# Patient Record
Sex: Male | Born: 1950
Health system: Southern US, Community
[De-identification: ages and names within clinical notes are randomized; demographics above are authoritative.]

## PROBLEM LIST (undated history)

## (undated) DIAGNOSIS — E291 Testicular hypofunction: Secondary | ICD-10-CM

## (undated) DIAGNOSIS — K5792 Diverticulitis of intestine, part unspecified, without perforation or abscess without bleeding: Secondary | ICD-10-CM

## (undated) DIAGNOSIS — R5382 Chronic fatigue, unspecified: Secondary | ICD-10-CM

## (undated) DIAGNOSIS — M109 Gout, unspecified: Secondary | ICD-10-CM

## (undated) DIAGNOSIS — L02419 Cutaneous abscess of limb, unspecified: Secondary | ICD-10-CM

## (undated) DIAGNOSIS — E041 Nontoxic single thyroid nodule: Secondary | ICD-10-CM

## (undated) DIAGNOSIS — G4730 Sleep apnea, unspecified: Secondary | ICD-10-CM

## (undated) DIAGNOSIS — C801 Malignant (primary) neoplasm, unspecified: Secondary | ICD-10-CM

## (undated) DIAGNOSIS — J309 Allergic rhinitis, unspecified: Secondary | ICD-10-CM

## (undated) DIAGNOSIS — L501 Idiopathic urticaria: Secondary | ICD-10-CM

## (undated) DIAGNOSIS — J302 Other seasonal allergic rhinitis: Secondary | ICD-10-CM

## (undated) DIAGNOSIS — L03119 Cellulitis of unspecified part of limb: Secondary | ICD-10-CM

## (undated) DIAGNOSIS — K219 Gastro-esophageal reflux disease without esophagitis: Secondary | ICD-10-CM

## (undated) DIAGNOSIS — E039 Hypothyroidism, unspecified: Secondary | ICD-10-CM

## (undated) DIAGNOSIS — M1990 Unspecified osteoarthritis, unspecified site: Secondary | ICD-10-CM

## (undated) DIAGNOSIS — E063 Autoimmune thyroiditis: Secondary | ICD-10-CM

## (undated) DIAGNOSIS — I10 Essential (primary) hypertension: Secondary | ICD-10-CM

## (undated) DIAGNOSIS — G43909 Migraine, unspecified, not intractable, without status migrainosus: Secondary | ICD-10-CM

## (undated) HISTORY — DX: Autoimmune thyroiditis: E06.3

## (undated) HISTORY — DX: Gout, unspecified: M10.9

## (undated) HISTORY — DX: Migraine, unspecified, not intractable, without status migrainosus: G43.909

## (undated) HISTORY — DX: Chronic fatigue, unspecified: R53.82

## (undated) HISTORY — DX: Cutaneous abscess of limb, unspecified: L02.419

## (undated) HISTORY — DX: Idiopathic urticaria: L50.1

## (undated) HISTORY — DX: Diverticulitis of intestine, part unspecified, without perforation or abscess without bleeding: K57.92

## (undated) HISTORY — DX: Unspecified osteoarthritis, unspecified site: M19.90

## (undated) HISTORY — DX: Allergic rhinitis, unspecified: J30.9

## (undated) HISTORY — DX: Hypothyroidism, unspecified: E03.9

## (undated) HISTORY — DX: Nontoxic single thyroid nodule: E04.1

## (undated) HISTORY — DX: Cutaneous abscess of limb, unspecified: L03.119

## (undated) HISTORY — DX: Testicular hypofunction: E29.1

## (undated) HISTORY — DX: Gastro-esophageal reflux disease without esophagitis: K21.9

---
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
## 1972-02-21 HISTORY — PX: CYSTOSCOPY: SUR368

## 1980-02-21 HISTORY — PX: OTHER SURGICAL HISTORY: SHX169

## 1988-02-21 HISTORY — PX: OTHER SURGICAL HISTORY: SHX169

## 1997-02-20 HISTORY — PX: TOTAL HIP ARTHROPLASTY: SHX124

## 1997-07-06 ENCOUNTER — Inpatient Hospital Stay (HOSPITAL_COMMUNITY): Admission: RE | Admit: 1997-07-06 | Discharge: 1997-07-11 | Payer: Self-pay | Admitting: Orthopedic Surgery

## 1997-07-13 ENCOUNTER — Other Ambulatory Visit: Admission: RE | Admit: 1997-07-13 | Discharge: 1997-07-13 | Payer: Self-pay | Admitting: Orthopedic Surgery

## 1997-07-21 ENCOUNTER — Other Ambulatory Visit: Admission: RE | Admit: 1997-07-21 | Discharge: 1997-07-21 | Payer: Self-pay | Admitting: Orthopedic Surgery

## 1997-07-29 ENCOUNTER — Other Ambulatory Visit: Admission: RE | Admit: 1997-07-29 | Discharge: 1997-07-29 | Payer: Self-pay | Admitting: Orthopedic Surgery

## 1998-09-15 ENCOUNTER — Encounter: Payer: Self-pay | Admitting: Gastroenterology

## 1998-09-15 ENCOUNTER — Ambulatory Visit (HOSPITAL_COMMUNITY): Admission: RE | Admit: 1998-09-15 | Discharge: 1998-09-15 | Payer: Self-pay | Admitting: Gastroenterology

## 1999-05-06 ENCOUNTER — Ambulatory Visit (HOSPITAL_COMMUNITY): Admission: RE | Admit: 1999-05-06 | Discharge: 1999-05-06 | Payer: Self-pay | Admitting: Gastroenterology

## 1999-06-22 ENCOUNTER — Encounter: Admission: RE | Admit: 1999-06-22 | Discharge: 1999-06-22 | Payer: Self-pay | Admitting: Gastroenterology

## 1999-06-22 ENCOUNTER — Encounter: Payer: Self-pay | Admitting: Gastroenterology

## 1999-07-25 ENCOUNTER — Encounter: Payer: Self-pay | Admitting: Gastroenterology

## 1999-07-25 ENCOUNTER — Ambulatory Visit (HOSPITAL_COMMUNITY): Admission: RE | Admit: 1999-07-25 | Discharge: 1999-07-25 | Payer: Self-pay | Admitting: Gastroenterology

## 1999-09-15 ENCOUNTER — Encounter: Admission: RE | Admit: 1999-09-15 | Discharge: 1999-09-15 | Payer: Self-pay | Admitting: Internal Medicine

## 1999-09-15 ENCOUNTER — Encounter: Payer: Self-pay | Admitting: Internal Medicine

## 1999-09-22 ENCOUNTER — Encounter: Admission: RE | Admit: 1999-09-22 | Discharge: 1999-09-22 | Payer: Self-pay | Admitting: Gastroenterology

## 1999-09-22 ENCOUNTER — Encounter: Payer: Self-pay | Admitting: Gastroenterology

## 2000-03-05 ENCOUNTER — Encounter: Payer: Self-pay | Admitting: Gastroenterology

## 2000-03-05 ENCOUNTER — Ambulatory Visit (HOSPITAL_COMMUNITY): Admission: RE | Admit: 2000-03-05 | Discharge: 2000-03-05 | Payer: Self-pay | Admitting: Gastroenterology

## 2000-05-01 ENCOUNTER — Ambulatory Visit (HOSPITAL_COMMUNITY): Admission: RE | Admit: 2000-05-01 | Discharge: 2000-05-01 | Payer: Self-pay | Admitting: Family Medicine

## 2000-05-01 ENCOUNTER — Encounter: Payer: Self-pay | Admitting: Family Medicine

## 2000-06-15 ENCOUNTER — Ambulatory Visit (HOSPITAL_COMMUNITY): Admission: RE | Admit: 2000-06-15 | Discharge: 2000-06-15 | Payer: Self-pay | Admitting: Interventional Cardiology

## 2000-06-18 ENCOUNTER — Encounter: Payer: Self-pay | Admitting: Interventional Cardiology

## 2000-07-19 ENCOUNTER — Emergency Department (HOSPITAL_COMMUNITY): Admission: EM | Admit: 2000-07-19 | Discharge: 2000-07-19 | Payer: Self-pay | Admitting: Emergency Medicine

## 2000-07-21 ENCOUNTER — Ambulatory Visit (HOSPITAL_COMMUNITY): Admission: RE | Admit: 2000-07-21 | Discharge: 2000-07-21 | Payer: Self-pay | Admitting: Geriatric Medicine

## 2000-07-21 ENCOUNTER — Encounter: Payer: Self-pay | Admitting: Geriatric Medicine

## 2000-07-30 ENCOUNTER — Ambulatory Visit (HOSPITAL_COMMUNITY): Admission: RE | Admit: 2000-07-30 | Discharge: 2000-07-30 | Payer: Self-pay | Admitting: Interventional Cardiology

## 2000-08-10 ENCOUNTER — Ambulatory Visit (HOSPITAL_COMMUNITY): Admission: RE | Admit: 2000-08-10 | Discharge: 2000-08-10 | Payer: Self-pay | Admitting: Interventional Cardiology

## 2000-08-22 ENCOUNTER — Ambulatory Visit (HOSPITAL_COMMUNITY): Admission: RE | Admit: 2000-08-22 | Discharge: 2000-08-22 | Payer: Self-pay | Admitting: Orthopedic Surgery

## 2000-08-24 ENCOUNTER — Encounter: Payer: Self-pay | Admitting: Gastroenterology

## 2000-08-24 ENCOUNTER — Encounter: Admission: RE | Admit: 2000-08-24 | Discharge: 2000-08-24 | Payer: Self-pay

## 2000-12-10 ENCOUNTER — Encounter: Admission: RE | Admit: 2000-12-10 | Discharge: 2000-12-10 | Payer: Self-pay | Admitting: Gastroenterology

## 2000-12-10 ENCOUNTER — Encounter: Payer: Self-pay | Admitting: Gastroenterology

## 2001-01-04 ENCOUNTER — Ambulatory Visit (HOSPITAL_COMMUNITY): Admission: RE | Admit: 2001-01-04 | Discharge: 2001-01-04 | Payer: Self-pay | Admitting: Family Medicine

## 2001-01-04 ENCOUNTER — Encounter: Payer: Self-pay | Admitting: Internal Medicine

## 2001-09-24 ENCOUNTER — Encounter: Payer: Self-pay | Admitting: Internal Medicine

## 2001-09-24 ENCOUNTER — Encounter: Admission: RE | Admit: 2001-09-24 | Discharge: 2001-09-24 | Payer: Self-pay | Admitting: Internal Medicine

## 2002-02-20 HISTORY — PX: CHOLECYSTECTOMY: SHX55

## 2002-06-02 ENCOUNTER — Encounter: Payer: Self-pay | Admitting: Internal Medicine

## 2002-06-02 ENCOUNTER — Encounter: Admission: RE | Admit: 2002-06-02 | Discharge: 2002-06-02 | Payer: Self-pay | Admitting: Internal Medicine

## 2002-08-08 ENCOUNTER — Encounter: Payer: Self-pay | Admitting: Surgery

## 2002-08-08 ENCOUNTER — Encounter: Admission: RE | Admit: 2002-08-08 | Discharge: 2002-08-08 | Payer: Self-pay | Admitting: Surgery

## 2002-12-19 ENCOUNTER — Encounter (INDEPENDENT_AMBULATORY_CARE_PROVIDER_SITE_OTHER): Payer: Self-pay | Admitting: *Deleted

## 2002-12-19 ENCOUNTER — Ambulatory Visit (HOSPITAL_COMMUNITY): Admission: RE | Admit: 2002-12-19 | Discharge: 2002-12-20 | Payer: Self-pay | Admitting: Surgery

## 2003-09-10 ENCOUNTER — Encounter: Admission: RE | Admit: 2003-09-10 | Discharge: 2003-09-10 | Payer: Self-pay | Admitting: Orthopedic Surgery

## 2004-03-31 ENCOUNTER — Encounter: Admission: RE | Admit: 2004-03-31 | Discharge: 2004-03-31 | Payer: Self-pay | Admitting: Orthopedic Surgery

## 2005-11-07 ENCOUNTER — Encounter: Admission: RE | Admit: 2005-11-07 | Discharge: 2005-11-07 | Payer: Self-pay | Admitting: Gastroenterology

## 2005-11-14 ENCOUNTER — Encounter: Admission: RE | Admit: 2005-11-14 | Discharge: 2005-11-14 | Payer: Self-pay | Admitting: Gastroenterology

## 2005-11-20 ENCOUNTER — Emergency Department (HOSPITAL_COMMUNITY): Admission: EM | Admit: 2005-11-20 | Discharge: 2005-11-20 | Payer: Self-pay | Admitting: Emergency Medicine

## 2006-02-20 DIAGNOSIS — E041 Nontoxic single thyroid nodule: Secondary | ICD-10-CM

## 2006-02-20 HISTORY — DX: Nontoxic single thyroid nodule: E04.1

## 2006-02-27 ENCOUNTER — Encounter: Admission: RE | Admit: 2006-02-27 | Discharge: 2006-02-27 | Payer: Self-pay | Admitting: Internal Medicine

## 2006-03-01 ENCOUNTER — Encounter: Admission: RE | Admit: 2006-03-01 | Discharge: 2006-03-01 | Payer: Self-pay | Admitting: Orthopaedic Surgery

## 2006-04-12 ENCOUNTER — Encounter: Admission: RE | Admit: 2006-04-12 | Discharge: 2006-04-12 | Payer: Self-pay | Admitting: Orthopaedic Surgery

## 2006-05-16 ENCOUNTER — Encounter: Payer: Self-pay | Admitting: Internal Medicine

## 2006-05-17 ENCOUNTER — Ambulatory Visit: Payer: Self-pay | Admitting: Internal Medicine

## 2006-05-17 DIAGNOSIS — I10 Essential (primary) hypertension: Secondary | ICD-10-CM

## 2006-05-17 DIAGNOSIS — E669 Obesity, unspecified: Secondary | ICD-10-CM

## 2006-05-17 DIAGNOSIS — J309 Allergic rhinitis, unspecified: Secondary | ICD-10-CM | POA: Insufficient documentation

## 2006-05-17 DIAGNOSIS — M1991 Primary osteoarthritis, unspecified site: Secondary | ICD-10-CM | POA: Insufficient documentation

## 2006-05-17 DIAGNOSIS — M1990 Unspecified osteoarthritis, unspecified site: Secondary | ICD-10-CM | POA: Insufficient documentation

## 2006-05-17 DIAGNOSIS — Z96643 Presence of artificial hip joint, bilateral: Secondary | ICD-10-CM | POA: Insufficient documentation

## 2006-05-17 HISTORY — DX: Essential (primary) hypertension: I10

## 2006-05-17 HISTORY — DX: Obesity, unspecified: E66.9

## 2006-05-17 HISTORY — DX: Primary osteoarthritis, unspecified site: M19.91

## 2006-05-17 HISTORY — DX: Allergic rhinitis, unspecified: J30.9

## 2006-06-06 ENCOUNTER — Telehealth: Payer: Self-pay | Admitting: Internal Medicine

## 2006-06-16 ENCOUNTER — Encounter: Admission: RE | Admit: 2006-06-16 | Discharge: 2006-06-16 | Payer: Self-pay | Admitting: Gastroenterology

## 2006-07-26 ENCOUNTER — Inpatient Hospital Stay (HOSPITAL_COMMUNITY): Admission: RE | Admit: 2006-07-26 | Discharge: 2006-07-29 | Payer: Self-pay | Admitting: Orthopaedic Surgery

## 2006-07-26 HISTORY — PX: TOTAL HIP ARTHROPLASTY: SHX124

## 2006-10-08 ENCOUNTER — Encounter (INDEPENDENT_AMBULATORY_CARE_PROVIDER_SITE_OTHER): Payer: Self-pay | Admitting: Orthopaedic Surgery

## 2006-10-08 ENCOUNTER — Ambulatory Visit (HOSPITAL_COMMUNITY): Admission: RE | Admit: 2006-10-08 | Discharge: 2006-10-08 | Payer: Self-pay | Admitting: Orthopaedic Surgery

## 2006-10-08 ENCOUNTER — Ambulatory Visit: Payer: Self-pay | Admitting: Vascular Surgery

## 2006-11-24 ENCOUNTER — Encounter: Admission: RE | Admit: 2006-11-24 | Discharge: 2006-11-24 | Payer: Self-pay | Admitting: Gastroenterology

## 2006-11-24 ENCOUNTER — Encounter: Payer: Self-pay | Admitting: Internal Medicine

## 2007-01-24 ENCOUNTER — Encounter: Admission: RE | Admit: 2007-01-24 | Discharge: 2007-01-24 | Payer: Self-pay | Admitting: Internal Medicine

## 2007-02-08 ENCOUNTER — Other Ambulatory Visit: Admission: RE | Admit: 2007-02-08 | Discharge: 2007-02-08 | Payer: Self-pay | Admitting: Interventional Radiology

## 2007-02-08 ENCOUNTER — Encounter (INDEPENDENT_AMBULATORY_CARE_PROVIDER_SITE_OTHER): Payer: Self-pay | Admitting: Interventional Radiology

## 2007-02-08 ENCOUNTER — Encounter: Admission: RE | Admit: 2007-02-08 | Discharge: 2007-02-08 | Payer: Self-pay | Admitting: Surgery

## 2007-10-17 ENCOUNTER — Encounter: Admission: RE | Admit: 2007-10-17 | Discharge: 2007-10-17 | Payer: Self-pay | Admitting: Internal Medicine

## 2008-01-15 ENCOUNTER — Encounter: Admission: RE | Admit: 2008-01-15 | Discharge: 2008-01-15 | Payer: Self-pay | Admitting: Orthopaedic Surgery

## 2008-05-15 ENCOUNTER — Encounter: Payer: Self-pay | Admitting: Internal Medicine

## 2008-05-15 ENCOUNTER — Encounter: Admission: RE | Admit: 2008-05-15 | Discharge: 2008-05-15 | Payer: Self-pay | Admitting: Internal Medicine

## 2008-10-01 ENCOUNTER — Ambulatory Visit: Payer: Self-pay | Admitting: Internal Medicine

## 2008-10-19 ENCOUNTER — Ambulatory Visit: Payer: Self-pay | Admitting: Internal Medicine

## 2008-11-12 ENCOUNTER — Telehealth: Payer: Self-pay | Admitting: Internal Medicine

## 2008-12-21 ENCOUNTER — Emergency Department (HOSPITAL_BASED_OUTPATIENT_CLINIC_OR_DEPARTMENT_OTHER): Admission: EM | Admit: 2008-12-21 | Discharge: 2008-12-21 | Payer: Self-pay | Admitting: Emergency Medicine

## 2008-12-21 ENCOUNTER — Ambulatory Visit: Payer: Self-pay | Admitting: Diagnostic Radiology

## 2009-07-14 ENCOUNTER — Encounter: Admission: RE | Admit: 2009-07-14 | Discharge: 2009-07-14 | Payer: Self-pay | Admitting: Internal Medicine

## 2009-07-26 ENCOUNTER — Encounter: Admission: RE | Admit: 2009-07-26 | Discharge: 2009-07-26 | Payer: Self-pay | Admitting: Gastroenterology

## 2010-03-13 ENCOUNTER — Encounter: Payer: Self-pay | Admitting: Orthopaedic Surgery

## 2010-05-25 LAB — CBC
Hemoglobin: 16.9 g/dL (ref 13.0–17.0)
MCHC: 34.3 g/dL (ref 30.0–36.0)
RBC: 5.65 MIL/uL (ref 4.22–5.81)
WBC: 7.5 10*3/uL (ref 4.0–10.5)

## 2010-05-25 LAB — COMPREHENSIVE METABOLIC PANEL
Albumin: 4.5 g/dL (ref 3.5–5.2)
BUN: 16 mg/dL (ref 6–23)
Chloride: 97 mEq/L (ref 96–112)
Creatinine, Ser: 1 mg/dL (ref 0.4–1.5)
GFR calc Af Amer: >60 mL/min (ref >60)
Glucose, Bld: 95 mg/dL (ref 70–99)
Total Bilirubin: 0.9 mg/dL (ref 0.3–1.2)

## 2010-05-25 LAB — DIFFERENTIAL
Basophils Absolute: 0 10*3/uL (ref 0.0–0.1)
Lymphocytes Relative: 35 % (ref 12–46)
Monocytes Absolute: 0.7 10*3/uL (ref 0.1–1.0)
Neutro Abs: 3.8 10*3/uL (ref 1.7–7.7)
Neutrophils Relative %: 50 % (ref 43–77)

## 2010-07-05 NOTE — Op Note (Signed)
NAMESABIR, CHARTERS              ACCOUNT NO.:  [id number]   MEDICAL RECORD NO.:  [id number]          PATIENT TYPE:  INP   LOCATION:  2899                         FACILITY:  [facility]   PHYSICIAN:  Claude Manges. Whitfield, M.D.DATE OF BIRTH:  1950/08/17   DATE OF PROCEDURE:  07/26/2006  DATE OF DISCHARGE:                               OPERATIVE REPORT   PREOPERATIVE DIAGNOSIS:  Osteoarthritis left hip.   POSTOPERATIVE DIAGNOSIS:  Osteoarthritis left hip.   PROCEDURE:  Left total hip replacement.   SURGEON:  Claude Manges. Cleophas Dunker, M.D.   ASSISTANT:  Thereasa Distance A. Chaney Malling, M.D.  Richardean Canal, P.A.   ANESTHESIA:  General.   COMPLICATIONS:  None.   ESTIMATED BLOOD LOSS:  About 700 mL.   COMPONENTS:  DePuy AML 13.5 mm large femoral component, +5 neck length  with 36 mm outer diameter hip ball, a 56 mm outer diameter acetabular  100 series component with the apex hole eliminator and the marathon  polyethylene liner +4 with a 10 degrees lip.   PROCEDURE:  The patient comfortable on the operating table and under  general orotracheal anesthesia the nursing staff inserted a Foley  catheter.  The patient was then placed in the lateral decubitus position  with the left side up and secured to the operating room table with the  Innomed hip system.  The hip was prepped with Betadine scrub and then  DuraPrep from iliac crest to the midcalf.  Sterile draping was  performed.   A routine southern incision was utilized and via sharp dissection  created in the subcutaneous tissue.  By sharp dissection abundant  adipose tissue was incised at the level of the iliotibial band.  The  iliotibial band was then incised along the length of the skin incision.  Self-retaining retractor, East-West retractor was inserted.  With the  hip internally rotated the short external rotators were identified.  They were tagged with 0 Ethilon suture and then incised from the  posterior attachment of the greater trochanter.   Capsule was identified  and incised along the femoral neck and head.  There was a clear yellow  joint effusion and moderate beefy red synovitis.  The hip was easily  dislocated posteriorly.   The head was then osteotomized just over fingerbreadth proximal to the  lesser trochanter and then removed from the wound.  Piriformis fossa was  identified.  A starter hole was then made, calcar reamer inserted and  reaming was performed to 13 mm to accept a 13.5 mm component which we  had templated preoperatively in which I felt was the appropriate size.   Rasping was then performed sequentially to 13.5 mm large component.   I thought we were still a little long in the neck so we osteotomized  about 2 mm and that left [place] about one fingerbreadth or just under and  proximal to the lesser trochanter.  We had a very nice fit.   Retractor was then placed about the acetabulum.  The labrum was sharply  excised circumferentially and reaming was performed sequentially to 55  mm to accept a 56-mm component.  We  had excellent depth and  circumferential bleeding.  We trialed a 54-mm component.  We had a nice  rim fit but it would completely seat.  The 56 did not completely seat  and we felt that was the appropriate size.  The 56 mm outer diameter  acetabular component was then impacted.  Because we had some difficulty  visualizing we obtained an x-ray and felt there was a little bit to  vertical so then we re-positioned the acetabulum with less vertical  position and slightly more flexion.  It was very nice and tight.   The plus 4, 10 degree liner was then inserted.  We reinserted the  femoral component.  We trialed several neck and head sizes.  Again we  felt that the 36 was the most stable and this certainly corresponded to  the polyethylene in the acetabulum.  We initially tried a -2 and then a  +1.5 mm neck length and felt that the +1.5 was perfectly stable.   The trial components were then removed.   We copiously irrigated the  joint with saline solution.  The apex hole eliminator was inserted.  We  impacted the final marathon polyethylene component.   The femoral canal was then irrigated.  The femoral component was then  impacted onto the calcar.  We trialed a 1.5 mm ball.  We thought it was  just a little unstable with flexion and internal rotation so that we  opted to insert the +5 ball.  It took [place] a while to insert this but once  it was inserted we thought we had perfect stability.  The hip was then  dislocated posteriorly.  We cleaned the [facility] taper neck, checked to be  sure there was no loose tissue in the acetabulum and then impacted the  36 mm outer diameter hip ball with a 5 mm neck length.  This was then  reduced.  There was no toggling.  We are perhaps a slight longer on the  left leg than the right.  He was about 1/4 inch short preoperatively but  we could not dislocate the hip.  It was not tight in extension.  The  wound was then copiously irrigated with saline solution.  We injected  Marcaine with epinephrine throughout our closure.  The capsule was  closed with 0 Ethibond suture.  The short external rotators were closed  anatomically with the same material.  The wound was again irrigated.  We  further infiltrated with Marcaine epinephrine along the iliotibial band.  This was closed with a running 0 Vicryl.  We closed the subcu in several  layers with 0 and 2-0 Vicryl, skin closed with skin clips.  Sterile  bulky dressing was applied.  The patient was then placed in the supine  position, returned to the post anesthesia recovery room in satisfactory  condition.      Claude Manges. Cleophas Dunker, M.D.  Electronically Signed     PWW/MEDQ  D:  07/26/2006  T:  07/26/2006  Job:  [id number]

## 2010-07-08 NOTE — Discharge Summary (Signed)
Jonathan Powers, Jonathan Powers              ACCOUNT NO.:  [id number]   MEDICAL RECORD NO.:  [id number]          PATIENT TYPE:  INP   LOCATION:  5017                         FACILITY:  [facility]   PHYSICIAN:  Claude Manges. Whitfield, M.D.DATE OF BIRTH:  06-01-1950   DATE OF ADMISSION:  07/26/2006  DATE OF DISCHARGE:  07/29/2006                               DISCHARGE SUMMARY   ADMISSION DIAGNOSES:  1. End-stage osteoarthritis left hip.  2. Hypertension.  3. Obesity.  4. Low testosterone.  5. Low vitamin D.  6. History of right total hip.   DISCHARGE DIAGNOSES:  1. End-stage osteoarthritis left hip status post left total hip      arthroplasty.  2. Acute blood loss anemia secondary to surgery.  3. Hypoxia now resolved.  4. Constipation now resolved.  5. Hypertension.  6. Obesity.  7. Low testosterone.  8. Low vitamin D.  9. History of right total hip.   SURGICAL PROCEDURES:  On July 26, 2006, Mr. Bettendorf underwent a left  total hip arthroplasty by Dr. Claude Manges.  Whitfield assisted by Dr. Rinaldo Ratel and Rexene Edison PA-C.  He had a DePuy Pinnacle 100 series  acetabular cup size 56 mm placed with an apex hole eliminator and a  Pinnacle Marathon acetabular liner +4 10-degree liner, 56 mm outer  diameter, 36 mm inner diameter.  An AML large stature 160 mm length, 45  mm offset size, 13.5 femoral stem with an __________  metal-on-metal  femoral head, 36 mm head, +5 neck length, 12/14 cone.   COMPLICATIONS:  None.   CONSULTANT:  1. Pharmacy consult for Coumadin therapy, July 26, 2006, in addition to      Internal Medicine consult.  2. Physical therapy consult July 27, 2006.  3. Occupational therapy consult July 28, 2006.   HISTORY OF PRESENT ILLNESS:  This 60 year old white male patient  presented to Dr. Cleophas Dunker with several year history of left groin and  hip pain.  He has a history of a right hip replacement which is done  well.  Pain has gotten worse over the last 6 months where crutches  are  necessary.  Pain is constant, present with any range of motion and  causes difficulty sleeping.  He has had multiple corticosteroid  injections with minimal improvements over the last one.  X-rays showed  near bone-on-bone contact arthritis in the left hip.  Because of his  failure of conservative treatment, he is presenting for a left hip  replacement.   HOSPITAL COURSE:  Mr. Dedman tolerated surgical procedure well without  immediate postoperative complications.  He was transferred to 5000.  On  the night of surgery, he had one episode of shortness of breath, but O2  sats remained within normal limits.  Leg was neurovascularly intact.  Internal Medicine was asked to consult because of low sats, and they  followed him the rest of this hospitalization.   Postop day #1, T-max was 99.3, vitals were stable.  Hemoglobin 11.8,  hematocrit 34.  Leg was neurovascularly intact.  Pain was fairly well  controlled.  He was started on therapy per  protocol.  Due to the  hypoxia, they felt he might have some sleep apnea, so chest x-ray was  ordered and immediate mobilization.   Postop day #2, T-max 99.1, vitals stable.  Hemoglobin 10.2, hematocrit  29.4, INR was 1.7.  UA showed a few bacteria.  Chest x-ray had shown  maybe a little atelectasis.  Pain was well-controlled and incision was  well-approximated with staples.  He was started on OxyContin 10 q.12 h.  Plans were made for home probably in the next day.   Postop day #3, his pain was improved with use of OxyContin.  He was  voiding without difficulty, but no bowel movement at that time.  T-max  99.2, incision well approximated.  With his constipation, a laxative was  ordered.  He did do well with therapy and it was felt he was ready for  DC home later that day and he was DC home at that time.   MEDICATIONS:  He may resume his home medications except for Extra  Strength Tylenol and the Vicodin he was taking preoperatively.  Home   medications included, otherwise:  1. Diovan 160 mg one tablet p.o. q.a.m..  2. Hydrochlorothiazide 25 mg one tablet p.o. q.a.m. p.r.n..  3. Zyrtec 10 mg one tablet p.o. q.a.m.[name]  4. Vitamin D 1000 international units one tablet p.o. q.a.m..  5. Nexium 40 mg one tablet p.o. q.a.m..  6. Tylenol PM q.h.s..  7. Central one tablet p.o. q.a.m..  8. AndroGel three squirts topically q.a.m.   ADDITIONAL MEDICATIONS AT THIS TIME:  1. Coumadin 5 mg p.o. q.p.m. with the dose to be adjusted by the      [facility] pharmacy.  He is to take this for one month.  2. OxyContin 10 mg one tablet p.o. q.12 h, #28 with no refill.  3. Percocet 5/325 mg 1-2 tablets p.o. q.4 h p.r.n. for pain, #60 with      no refill.  4. Robaxin 500 mg 1-2 tablets p.o. q.6 h p.r.n. for spasms, 45 with no      refill.   DISCHARGE INSTRUCTIONS:  1. Diet: He is to resume his regular prehospitalization diet.  2. Activity: He can be out of bed, weightbearing as tolerated on the      left leg with use of a walker.  He can increase activities slowly      and walk with assistance.  Please see the blue total hip discharge      sheet for further activity instructions.  3. Wound care: He may shower after no drainage from the wound for 2      days.  Please see the blue total hip discharge sheet for further      wound care instructions.  4. Follow-up: He needs to follow up with Dr. Cleophas Dunker in our office      on Wednesday August 08, 2006.  Needs to call [phone] for that      appointment.   LABORATORY DATA:  Chest x-ray done on June 6 showed cardiac enlargement  without heart failure and plate-like atelectasis within the right  midlung.  X-ray of the hip taken on June 5 showed normal alignment after  left hip arthroplasty.   Hemoglobin/hematocrit ranged from 15.8 and 44.8 on the 4th. to 10.1 and  29.7 on the 8th.  White count and platelets remained within normal  limits.   PT and INR went from 13.3 and 1.0 on June 4 to  27.7 and 2.4 on the 8th.  Glucose ranged from 124 on the 4th to 113 on the 8th.  BUN and  creatinine ranged from 10 and 1.09 on the 4th to 3 and 0.86 on the 7th  to 5 and 0.8 on the 8th.  GFR remained within normal limits.  All other  laboratory studies were within normal limits.      Legrand Pitts Duffy, P.A.      Claude Manges. Cleophas Dunker, M.D.  Electronically Signed    KED/MEDQ  D:  08/29/2006  T:  08/29/2006  Job:  [id number]   cc:   Thora Lance, M.D.

## 2010-07-08 NOTE — Cardiovascular Report (Signed)
[facility]  Patient:    Jonathan Powers, Jonathan Powers                       MRN: [id number] Proc. Date: 07/30/00 Adm. Date:  [id number] Disc. Date: [id number] Attending:  Lorre Nick CC:         Pearla Dubonnet, M.D.  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  He has a personal history of obesity, hypercholesterolemia, and hypertension.  Though he has had cardiac evaluation with myocardial perfusion studies that have been normal, he has continued to have recurrent episodes of chest tightness.  In this setting, it was felt that cardiac catheterization was indicated to rule out significant triple-vessel coronary disease that may yield a normal myocardial perfusion pattern no nuclear scintigraphy.  PROCEDURES PERFORMED: 1. Left heart catheterization. 2. Selective coronary angiography. 3. Left ventriculography.  DESCRIPTION:  After informed consent, a 6-French sheath was started in the right femoral artery using Smart needle.  His peripheral right femoral pulse was difficult to feel because of obesity.  After inserting the guide wire, the sheath was placed using the modified Seldinger technique.  A 6-French A-2 multipurpose catheter was then used for hemodynamic recordings, left ventriculography, and selective left and right coronary angiography.  The patient tolerated the procedure without significant complications.  RESULTS:    I. HEMODYNAMIC DATA:          a. Aortic pressure 157/97.          b. Left ventricular pressure 157/15 mmHg.   II. LEFT VENTRICULOGRAPHY:  The left ventricle is mildly dilated.  The LV      cavity is faintly opacified.  Contractility is normal.  The estimated      ejection fraction is 60%.  No mitral regurgitation is noted.  III. SELECTIVE CORONARY ANGIOGRAPHY:          a.  Left main coronary:  The left main coronary artery is normal.          b. Left anterior descending coronary:  The left anterior descending             coronary artery is large and wraps around the left ventricular             apex.  It gives origin to two diagonal branches.  No obstructive             lesions are noted.          c. Circumflex artery:  The circumflex coronary artery is a large             vessel that gives origin to one dominant obtuse marginal that             reaches the left ventricular apex along the left mid lateral             wall.  Four additional small obtuse marginal branches arise             distally.  The circumflex system is normal.          d. Right coronary:  The right coronary artery is nondominant and             normal.  CONCLUSIONS: 1. Normal coronary arteries. 2. Normal left ventricular function.  RECOMMENDATIONS:  No further cardiac evaluation.  Suggest GI evaluation to exclude gastrointestinal  origin of chest discomfort. DD:  07/30/00 TD:  07/30/00 Job: [id number]

## 2010-07-08 NOTE — H&P (Signed)
Jonathan Powers. [facility] Jonathan Powers [facility]  Patient:    Jonathan Powers, Jonathan Powers                       MRN: [id number] Adm. Date:  [id number] Attending:  Lyn Powers. Iii Dictator:   Jonathan Powers, N.P. CC:         Jonathan Powers, M.D.   History and Physical  DATE OF BIRTH:  [birth year]  PRIMARY PHYSICIAN:  Jonathan Powers.  IMPRESSION: 1. Atypical chest discomfort in this 60 year old gentleman who has had    prior normal exercise Cardiolyte test in November 2000.  He now    presents for a coronary angiography to definitely ascertain if the    etiology of his discomfort is related to coronary artery disease. 2. History of abdominal and chest discomfort with prior normal    esophagogastroduodenoscopy in March 2001.  Colonoscopy in March 2001,    revealing left colonic diverticulosis, abdominal ultrasound June 2001,    normal.  Abdominal and pelvic CT in August 2001, normal.  Upper    gastrointestinal with small bowel follow-through revealing a hiatal    hernia, but no other abnormality.  Recent HIDA scan in January 2002,    normal, with normal ejection fraction. 3. History of gastroesophageal reflux disease, on Aciphex without much    improvement per the patient. 4. Hypertension, currently on atenolol and Diovan. 5. History of allergic rhinitis. 6. Mononucleosis at age 49. 7. History of hypogonadism, prior use of testosterone patch, but this    caused a rash. 8. Cystoscopy at age 64. 4. Atypical migraines, left facial numbness.  PLAN:  The patient has been counseled to undergo, and has accepted the plans for a coronary angiography with possible percutaneous intervention if indicated and able.  The risks, potential complications, benefits, and alternatives to the procedure were discussed.  Jonathan Powers indicates his questions and concerns have been addressed, and is agreeable to proceed.  ALLERGIES:  NONSTEROIDAL AGENTS, causing wheezing.  PENICILLIN, causing a rash.   ANDROGEN, causing a rash.  CURRENT MEDICATIONS: 1. Aciphex p.o. q.d. 2. Tylenol p.o. once q.d. 3. Tylenol two tablets p.o. q.d. 4. Allegra 60 mg p.o. q.d. 5. Centrum multivitamin p.o. q.d.  PAST SURGICAL HISTORY: 1. Left hand nerve repair after trauma. 2. Bilateral carpal tunnel release. 3. Right total hip replacement at age 74.  SOCIAL HISTORY/HABITS:  Tobacco:  Negative.  ETOH:  Rare social use.  The patient is married for 25 years.  He has three daughters alive and well.  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  FAMILY HISTORY:  Father died at age 24 of congestive heart failure and hypertension.  Mother died at age 44 with a history of multiple sclerosis, diabetes mellitus, and congestive heart failure, question of a myocardial infarction in her 42s.  A sister has obesity, fibromyalgia, and hypertension.  REVIEW OF SYSTEMS:  As in the HPI and past medical history.  Otherwise lower extremity bilateral edema when standing in the operating room all day. Urinates three or four times a night as a result.  The review of systems otherwise negative other than what has been mentioned above.  PHYSICAL EXAMINATION:  VITAL SIGNS:  Blood pressure 162/87, heart rate 82 and regular, respirations 16, height 5 feet 10 inches, weight 320 pounds.  GENERAL:  He is an obese 60 year old gentleman, in no acute distress.  HEENT/NECK:  Brisk bilateral carotid upstroke  without bruits.  No significant jugular venous distention or thyromegaly.  CARDIAC:  A regular rate and rhythm, without murmur, rub, or gallop.  Normal S1, S2.  CHEST:  Lung sounds clear with equal bilateral excursion.  ABDOMEN:  Soft, nondistended, normoactive bowel sounds without bruit.  The abdomen is obese, nontender to applied pressure.  EXTREMITIES:  Distal pulses intact.  Negative pedal edema.  GENITOURINARY:  Deferred.  RECTAL:  Deferred.  NEUROLOGIC:  Alert and oriented x  3, nonfocal.  LABORATORY DATA:  Chest x-ray from September 15, 1999, revealed normal heart size. Lung sounds clear without active disease.  On January 12, 1999, exercise Cardiolyte negative for ischemia or infarction. Ejection fraction 63%.  Electrocardiogram:  From Jul 19, 2000, normal sinus rhythm without ischemic changes.  Labs from this morning:  CPK of 85, MB fraction 0.7, troponin 0.03.  Sodium 140, K of 3.6, chloride 102, CO2 of 28, glucose 116, BUN 11, creatinine 1.1, calcium 9.5.  Pro time 13.3, INR 1.1, PTT 29.  WBC 7.5, hemoglobin 16, hematocrit 45.7, platelets 386. DD:  07/30/00 TD:  07/30/00 Job: [id number]

## 2010-07-08 NOTE — H&P (Signed)
Jonathan Powers, Jonathan Powers              ACCOUNT NO.:  [id number]   MEDICAL RECORD NO.:  [id number]          PATIENT TYPE:  EMS   LOCATION:  ED                           FACILITY:  [facility]   PHYSICIAN:  Danise Edge, M.D.   DATE OF BIRTH:  [birth year]   DATE OF ADMISSION:  11/20/2005  DATE OF DISCHARGE:                                HISTORY & PHYSICAL   ADMISSION DIAGNOSIS:  Right upper quadrant abdominal pain following  laparoscopic cholecystectomy in 2004.   HISTORY:  Mr. Jonathan Powers a 60 year old male born 1950/05/27.  Mr.  Jonathan Powers has been having recurring bouts of right upper quadrant abdominal  pain predominantly stimulated by meal intake.  He has developed severe  sitophobia associated with weight loss.  He underwent a laparoscopic  cholecystectomy with normal operative cholangiogram in 2004.  His 2001  esophagogastroduodenoscopy was normal.  His 2001 colonoscopy showed only  left colonic diverticulosis.   Jonathan Powers has undergone an outpatient evaluation of his pain including the  following:  Normal CBC, complete metabolic profile and amylase.  CT scan of  the abdomen and pelvis showed bilateral inguinal hernias containing fat, a  1.5 cm complex hemorrhagic left kidney cyst, and left hip arthroplasty.  The  CT angiogram of the abdomen revealed patent mesenteric vessels and no kidney  stones.   This morning Jonathan Powers developed severe right upper quadrant abdominal pain  while assisting in orthopedic surgery, causing him to break scrub and  reporting to the [facility] emergency room for evaluation.  In the  emergency room his CBC and differential, complete metabolic profile,  urinalysis, amylase, lipase were normal.  His acute abdominal x-ray series  and MRCP are pending.   MEDICATION ALLERGIES:  NONSTEROIDAL ANTI-INFLAMMATORY DRUGS cause rash.  PENICILLIN causes rash.  ANDROGEN PATCH causes rash.   PAST MEDICAL HISTORY:  1. November 14, 2005, CT angiogram of the abdomen  revealed patent      mesenteric vessels, no kidney stones and a left kidney cyst.  2. November 07, 2005, CT scan of the abdomen and pelvis revealed a 1.5 cm      complex left kidney cyst, remote cholecystectomy, left colonic      diverticulosis, left hip arthroplasty.  3. December 19, 2002, a laparoscopic cholecystectomy with normal operative      cholangiogram.  4. September 22, 1999, normal upper GI small-bowel follow-through x-ray      series.  5. July 30, 2000, normal coronary angiography with left ventricular      ejection fraction.  6. January 2002, HIDA scan revealed a normal gallbladder ejection      fraction, chronic gastroesophageal reflux disease, with normal      esophagogastroduodenoscopy.  7. May 06, 1999, esophagogastroduodenoscopy normal; proctocolonoscopy to      the cecum revealed left colonic diverticulosis.  8. Hypertension.  9. Hypercholesterolemia.  10.Mononucleosis at age 29.  11.Cystoscopy age 47.  12.Atypical migraine syndrome.  13.Hypogonadism.  14.Vitamin D deficiency.  Right hip replacement surgery.  1. Bilateral inguinal hernias.  2. Left hand surgery following trauma.  3. Bilateral carpal tunnel release.  4. Allergic  rhinitis.   HABITS:  Jonathan Powers does not smoke cigarettes.  He consumes alcohol in  moderation.   SOCIAL HISTORY:  Jonathan Powers is married.  He has three daughters.   FAMILY HISTORY:  Noncontributory.   PHYSICAL EXAMINATION:  GENERAL APPEARANCE:  Jonathan Powers is alert and appears  comfortable.  HEENT:  Nonicteric sclerae.  CARDIAC:  A regular rhythm without murmurs.  Electrocardiogram reveals a  normal sinus rhythm and normal electrocardiogram.  LUNGS:  Clear to auscultation.  ABDOMEN:  Abdominal exam shows a soft abdomen.  There is discomfort to deep  palpation in the right upper quadrant, which he says radiates through to his  spine.   ASSESSMENT:  Recurrent right upper quadrant abdominal pain post laparoscopic  cholecystectomy in 2004.   Differential diagnosis includes:  Choledocholithiasis, biliary dyskinesia, peptic ulcer disease despite taking  proton pump inhibitor therapy, and functional abdominal pain.   PLAN:  Jonathan Powers requests a trial of Pro-Banthine.  Sublingual hyoscyamine was  not effective in controlling his bouts of pain.  He is scheduled for an  upper endoscopy and colonoscopy towards the end of the week.  He will  undergo an MRCP at 6 p.m. this evening.  He is claustrophobic, and I will  give him 2 mg of Ativan intravenously prior to his MR cholangiogram.           ______________________________  Danise Edge, M.D.     MJ/MEDQ  D:  11/20/2005  T:  11/21/2005  Job:  [id number]   cc:   Candyce Churn, M.D.  Fax: [phone]   Velora Heckler, MD  [phone] [address] [place]  [place] [address]

## 2010-07-08 NOTE — Op Note (Signed)
NAME:  Jonathan Powers, Jonathan Powers                        ACCOUNT NO.:  [id number]   MEDICAL RECORD NO.:  [id number]                   PATIENT TYPE:  OIB   LOCATION:  NA                                   FACILITY:  [facility]   PHYSICIAN:  Velora Heckler, M.D.                DATE OF BIRTH:  1950-12-18   DATE OF PROCEDURE:  12/19/2002  DATE OF DISCHARGE:                                 OPERATIVE REPORT   PREOPERATIVE DIAGNOSES:  Chronic cholecystitis, cholelithiasis, abdominal  pain.   POSTOPERATIVE DIAGNOSES:  Chronic cholecystitis, cholelithiasis, abdominal  pain.   PROCEDURE:  1. Laparoscopic cholecystectomy with intraoperative cholangiography.  2. Excise skin tags from torso x4.   SURGEON:  Velora Heckler, M.D.   ANESTHESIA:  General.   ESTIMATED BLOOD LOSS:  Minimal.   PREPARATION:  Betadine.   COMPLICATIONS:  None.   INDICATIONS FOR PROCEDURE:  The patient is a 60 year old white male who  presents with a greater than one year history of intermittent right upper  quadrant abdominal pain associated with nausea and weight loss. The patient  underwent multiple diagnostic studies. An ultrasound on August 08, 2002 at [facility]  shows cholelithiasis and fatty infiltration of the liver. The patient now  comes to surgery for cholecystectomy.   DESCRIPTION OF PROCEDURE:  The procedure was done in OR #17 at the [facility]. The patient is brought to the operating room, placed  in a supine position on the operating room table. Following the  administration of general anesthesia, the patient was prepped and draped in  the usual strict aseptic fashion. After ascertaining that an adequate level  of anesthesia had been obtained, an infraumbilical incision was made with a  #15 blade. Dissection was carried down through the subcutaneous tissues. The  fascia was incised in the midline and the peritoneal cavity is entered  cautiously. A #0 Vicryl pursestring suture is placed in the  fascia. A Hasson  cannula is introduced under direct vision and secured with a pursestring  suture. The abdomen is insufflated with carbon dioxide. The laparoscope was  introduced under direct vision and the abdomen explored. Operative ports are  placed along the right costal margin in the midline, mid clavicular line,  and anterior axillary line. The fundus of the gallbladder is grasped and  retracted cephalad. Dissection was begun at the neck of the gallbladder.  Peritoneum is incised. The cystic duct is dissected out along its length.  The cystic artery is dissected out along its length. A clip is placed at the  neck of the gallbladder. The cystic duct is incised. Clear gold bile  emanates from the cystic duct. There are also multiple small fragments of  what appear to be pigment stones measuring approximately 1 mm each. A Cook  cholangiography catheter is introduced through a stab wound in the right  upper quadrant. It is inserted into the cystic  duct and secured with a  Ligaclip. C-arm fluoroscopy is used and real-time cholangiography is  performed. There is a normal appearing cystic duct leading to a common bile  duct which is not dilated. There is free flow of contrast distally into the  duodenum without filling defect or obstruction. There is reflux of contrast  into the right and left hepatic ductal systems. The clip is withdrawn. The  cystic duct is triply clipped and divided. The cystic artery is doubly  clipped and divided. The gallbladder is then excised from the gallbladder  bed using the hook electrocautery for hemostasis. Good hemostasis is noted  throughout the gallbladder bed. There is no evidence of bile leak. The  gallbladder is placed in through an EndoCatch bag and withdrawn through the  umbilical port without difficulty. It contains multiple gallstones measuring  up to 1 cm in size each. The right upper quadrant is copiously irrigated  with warm saline which is  evacuated. Good hemostasis is noted. Ports are  removed under direct vision and good hemostasis is noted at all port sites.  Pneumoperitoneum is released. #0 Vicryl pursestring suture is tied securely  at the umbilicus. Port sites are anesthetized with local anesthetic. All  wounds are closed with interrupted 4-0 Vicryl subcuticular sutures. Benzoin  and Steri-Strips are applied. There are four small skin tags, two on the  left chest wall, two on the right abdominal wall which are excised sharply  with a #11 blade and the site cauterized with the electrocautery. Dressings  are applied to all wounds. The patient is awakened from anesthesia and  brought to the recovery room in stable condition. The patient tolerated the  procedure well.                                               Velora Heckler, M.D.    TMG/MEDQ  D:  12/19/2002  T:  12/19/2002  Job:  [id number]

## 2010-07-08 NOTE — Procedures (Signed)
[facility]  Patient:    Jonathan Powers, Jonathan Powers                       MRN: [id number] Proc. Date: 05/06/99 Attending:  Verlin Grills, M.D. CC:         Thora Lance, M.D.                           Procedure Report  PROCEDURE:  Esophagogastroduodenoscopy and proctocolonoscopy to the cecum.  ENDOSCOPIST:  Verlin Grills, M.D.  INDICATIONS:  The patient is a 60 year old male with gastroesophageal reflux disease complicated by erosive esophagitis by upper GI endoscopy in years past. He has developed heartburn on therapy.  Repeat esophagogastroscopy is performed to  rule out erosive esophagitis on gastroesophageal reflux therapy.  The patient has a history of diverticulitis and has had neoplastic colon polyps  removed.  He is due for a surveillance colonoscopy.  PREMEDICATION:  Demerol 100 mg and Versed 20 mg for both procedures.  ENDOSCOPE:  Olympus gastroscope and pediatric colonoscope.  DESCRIPTION OF PROCEDURE:  Esophagogastroduodenoscopy - after obtaining informed consent, the patient was placed in the left lateral decubitus position.  I administered intravenous Demerol and intravenous Versed to achieve conscious sedation for the procedure.  The patients blood pressure, oxygen saturation, and cardiac rhythm were monitored throughout the procedure, and documented in the medical record.  The Olympus gastroscope was passed through the posterior hypopharynx into the proximal esophagus without difficulty.  The hypopharynx, larynx, and vocal cords appeared normal.  Esophagoscopy:  The proximal, mid, and lower segments of the esophagus appeared  normal.  Endoscopically, there is no evidence of erosive esophagitis or Barretts esophagus.  There is no esophageal obstruction.  The esophagogastric junction is noted at 39 cm from the incisor teeth.  Gastroscopy:  Retroflexed view of the gastric cardia and fundus was  normal. The diaphragmatic hiatus is somewhat patchulous.  Endoscopic appearance of the gastric body, antrum, and pylorus was normal.  Duodenoscopy:  The duodenal bulb and descending duodenum appeared normal.  ASSESSMENT:  Patchulous diaphragmatic hiatus; otherwise normal esophagogastroduodenoscopy.  PROCTOCOLONOSCOPY TO CECUM PROCEDURE:  Anal inspection normal.  Digital rectal xam normal.  The Olympus pediatric video colonoscope was introduced into the rectum and under direct vision advanced to the cecum, as identified by a normal-appearing ileocecal valve.  Colonic preparation for the exam today was excellent.  Rectum normal.  Sigmoid colon and descending colon:  Scattered small left colonic diverticula.  Splenic flexure normal.  Transverse colon normal.  Hepatic flexure normal.  Ascending colon normal.  Cecum and ileocecal valve normal.  ASSESSMENT:  Small left colonic diverticula; otherwise normal proctocolonoscopy to the cecum.  No evidence of colorectal neoplasia.  RECOMMENDATIONS: 1. Proton pump inhibitor therapy to control gastroesophageal reflux. 2. Repeat surveillance colonoscopy in March 2006. DD:  05/06/99 TD:  05/09/99 Job: [id number]

## 2010-07-14 ENCOUNTER — Other Ambulatory Visit: Payer: Self-pay | Admitting: Internal Medicine

## 2010-07-14 ENCOUNTER — Ambulatory Visit
Admission: RE | Admit: 2010-07-14 | Discharge: 2010-07-14 | Disposition: A | Discharge status: Home / Self Care | Payer: BC Managed Care – PPO | Source: Ambulatory Visit | Attending: Internal Medicine | Admitting: Internal Medicine

## 2010-07-14 DIAGNOSIS — R109 Unspecified abdominal pain: Secondary | ICD-10-CM

## 2010-07-14 DIAGNOSIS — R197 Diarrhea, unspecified: Secondary | ICD-10-CM

## 2010-07-14 MED ORDER — IOHEXOL 300 MG/ML  SOLN
125.0000 mL | Freq: Once | INTRAMUSCULAR | Status: AC | PRN
  Administered 2010-07-14: 125 mL via INTRAVENOUS

## 2010-07-19 ENCOUNTER — Other Ambulatory Visit: Payer: Self-pay

## 2010-07-19 ENCOUNTER — Other Ambulatory Visit: Payer: BC Managed Care – PPO

## 2010-12-08 LAB — ABO/RH: ABO/RH(D): O POS

## 2010-12-08 LAB — URINE CULTURE
Colony Count: NO GROWTH
Culture: NO GROWTH
Special Requests: NEGATIVE

## 2010-12-08 LAB — COMPREHENSIVE METABOLIC PANEL
ALT: 31
Alkaline Phosphatase: 51
BUN: 10
CO2: 28
Chloride: 98
GFR calc non Af Amer: >60
Glucose, Bld: 124 — ABNORMAL HIGH
Potassium: 3.6
Sodium: 135
Total Bilirubin: 1.1

## 2010-12-08 LAB — PROTIME-INR
INR: 1
INR: 1.1
INR: 2.4 — ABNORMAL HIGH
Prothrombin Time: 13.3
Prothrombin Time: 27.7 — ABNORMAL HIGH

## 2010-12-08 LAB — BASIC METABOLIC PANEL
BUN: 3 — ABNORMAL LOW
CO2: 30
CO2: 32
CO2: 32
Calcium: 8.1 — ABNORMAL LOW
Calcium: 8.1 — ABNORMAL LOW
Chloride: 101
Creatinine, Ser: 0.8
GFR calc Af Amer: >60
GFR calc non Af Amer: >60
Glucose, Bld: 113 — ABNORMAL HIGH
Glucose, Bld: 119 — ABNORMAL HIGH
Glucose, Bld: 119 — ABNORMAL HIGH
Potassium: 3.7
Potassium: 3.9
Sodium: 138

## 2010-12-08 LAB — CBC
HCT: 29.4 — ABNORMAL LOW
HCT: 34 — ABNORMAL LOW
HCT: 44.8
Hemoglobin: 10.1 — ABNORMAL LOW
Hemoglobin: 11.8 — ABNORMAL LOW
Hemoglobin: 15.8
MCHC: 34.1
MCV: 87.1
Platelets: 246
RBC: 3.89 — ABNORMAL LOW
RBC: 5.22
RDW: 13.2
RDW: 13.2
RDW: 13.7
RDW: 13.8
WBC: 7.1

## 2010-12-08 LAB — URINALYSIS, ROUTINE W REFLEX MICROSCOPIC
Bilirubin Urine: NEGATIVE
Bilirubin Urine: NEGATIVE
Glucose, UA: NEGATIVE
Glucose, UA: NEGATIVE
Hgb urine dipstick: NEGATIVE
Hgb urine dipstick: NEGATIVE
Ketones, ur: NEGATIVE
Protein, ur: NEGATIVE
Protein, ur: NEGATIVE
Urobilinogen, UA: 0.2
Urobilinogen, UA: 0.2

## 2010-12-08 LAB — URINE MICROSCOPIC-ADD ON: WBC, UA: NONE SEEN

## 2010-12-08 LAB — B-NATRIURETIC PEPTIDE (CONVERTED LAB): Pro B Natriuretic peptide (BNP): <30

## 2010-12-08 LAB — CROSSMATCH

## 2011-01-11 LAB — BASIC METABOLIC PANEL
BUN: 14 mg/dL (ref 4–21)
Creatinine: 0.9 mg/dL (ref 0.6–1.3)
Glucose: 94 mg/dL
Potassium: 4.3 mmol/L (ref 3.4–5.3)
Sodium: 139 mmol/L (ref 137–147)

## 2011-01-11 LAB — HEPATIC FUNCTION PANEL
AST: 31 U/L (ref 14–40)
Bilirubin, Total: 0.7 mg/dL

## 2011-01-11 LAB — CBC AND DIFFERENTIAL: HCT: 48 % (ref 41–53)

## 2011-01-11 LAB — TSH: TSH: 1.73 u[IU]/mL (ref 0.41–5.90)

## 2011-01-11 LAB — LIPID PANEL: Triglycerides: 175 mg/dL — AB (ref 40–160)

## 2011-02-05 HISTORY — PX: OTHER SURGICAL HISTORY: SHX169

## 2011-02-21 LAB — HM COLONOSCOPY

## 2011-05-24 ENCOUNTER — Other Ambulatory Visit: Payer: Self-pay | Admitting: Orthopaedic Surgery

## 2011-05-24 DIAGNOSIS — M25511 Pain in right shoulder: Secondary | ICD-10-CM

## 2011-05-28 ENCOUNTER — Ambulatory Visit
Admission: RE | Admit: 2011-05-28 | Discharge: 2011-05-28 | Disposition: A | Discharge status: Home / Self Care | Payer: BC Managed Care – PPO | Source: Ambulatory Visit | Attending: Orthopaedic Surgery | Admitting: Orthopaedic Surgery

## 2011-05-28 DIAGNOSIS — M25511 Pain in right shoulder: Secondary | ICD-10-CM

## 2011-09-08 ENCOUNTER — Other Ambulatory Visit: Payer: Self-pay | Admitting: Orthopedic Surgery

## 2011-09-08 DIAGNOSIS — M79603 Pain in arm, unspecified: Secondary | ICD-10-CM

## 2011-09-08 DIAGNOSIS — M542 Cervicalgia: Secondary | ICD-10-CM

## 2011-09-10 ENCOUNTER — Other Ambulatory Visit: Payer: BC Managed Care – PPO

## 2011-09-10 ENCOUNTER — Ambulatory Visit
Admission: RE | Admit: 2011-09-10 | Discharge: 2011-09-10 | Disposition: A | Discharge status: Home / Self Care | Payer: BC Managed Care – PPO | Source: Ambulatory Visit | Attending: Orthopedic Surgery | Admitting: Orthopedic Surgery

## 2011-09-10 DIAGNOSIS — M79603 Pain in arm, unspecified: Secondary | ICD-10-CM

## 2011-09-10 DIAGNOSIS — M542 Cervicalgia: Secondary | ICD-10-CM

## 2011-09-15 HISTORY — PX: OTHER SURGICAL HISTORY: SHX169

## 2012-02-23 LAB — LIPID PANEL
HDL: 40 mg/dL (ref 35–70)
LDL Cholesterol: 107 mg/dL

## 2012-02-23 LAB — BASIC METABOLIC PANEL
BUN: 12 mg/dL (ref 4–21)
Glucose: 92 mg/dL
Potassium: 3.7 mmol/L (ref 3.4–5.3)
Sodium: 136 mmol/L — AB (ref 137–147)

## 2012-02-23 LAB — HEMOGLOBIN A1C: Hgb A1c MFr Bld: 5.8 % (ref 4.0–6.0)

## 2012-02-23 LAB — CBC AND DIFFERENTIAL
HCT: 47 % (ref 41–53)
Hemoglobin: 16.4 g/dL (ref 13.5–17.5)
WBC: 8.6 10^3/mL

## 2012-02-23 LAB — HEPATIC FUNCTION PANEL
ALT: 36 U/L (ref 10–40)
AST: 26 U/L (ref 14–40)

## 2012-07-31 ENCOUNTER — Other Ambulatory Visit: Payer: Self-pay | Admitting: Orthopaedic Surgery

## 2012-07-31 DIAGNOSIS — M25512 Pain in left shoulder: Secondary | ICD-10-CM

## 2012-08-09 ENCOUNTER — Ambulatory Visit
Admission: RE | Admit: 2012-08-09 | Discharge: 2012-08-09 | Disposition: A | Discharge status: Home / Self Care | Payer: BC Managed Care – PPO | Source: Ambulatory Visit | Attending: Orthopaedic Surgery | Admitting: Orthopaedic Surgery

## 2012-08-09 DIAGNOSIS — M25512 Pain in left shoulder: Secondary | ICD-10-CM

## 2012-08-10 ENCOUNTER — Emergency Department (HOSPITAL_COMMUNITY): Payer: BC Managed Care – PPO

## 2012-08-10 ENCOUNTER — Emergency Department (HOSPITAL_COMMUNITY)
Admission: EM | Admit: 2012-08-10 | Discharge: 2012-08-11 | Disposition: A | Discharge status: Home / Self Care | Payer: BC Managed Care – PPO | Attending: Emergency Medicine | Admitting: Emergency Medicine

## 2012-08-10 ENCOUNTER — Encounter (HOSPITAL_COMMUNITY): Payer: Self-pay | Admitting: Adult Health

## 2012-08-10 DIAGNOSIS — Z880 Allergy status to penicillin: Secondary | ICD-10-CM | POA: Insufficient documentation

## 2012-08-10 DIAGNOSIS — I10 Essential (primary) hypertension: Secondary | ICD-10-CM | POA: Insufficient documentation

## 2012-08-10 DIAGNOSIS — R079 Chest pain, unspecified: Secondary | ICD-10-CM

## 2012-08-10 DIAGNOSIS — Z79899 Other long term (current) drug therapy: Secondary | ICD-10-CM | POA: Insufficient documentation

## 2012-08-10 HISTORY — DX: Essential (primary) hypertension: I10

## 2012-08-10 LAB — BASIC METABOLIC PANEL
BUN: 11 mg/dL (ref 6–23)
Calcium: 9.5 mg/dL (ref 8.4–10.5)
GFR calc Af Amer: >90 mL/min (ref >90)
GFR calc non Af Amer: 87 mL/min — ABNORMAL LOW (ref >90)
Potassium: 3.6 mEq/L (ref 3.5–5.1)

## 2012-08-10 LAB — CBC
HCT: 47.5 % (ref 39.0–52.0)
MCH: 30.7 pg (ref 26.0–34.0)
MCHC: 36 g/dL (ref 30.0–36.0)
RDW: 13.3 % (ref 11.5–15.5)

## 2012-08-10 NOTE — ED Notes (Addendum)
At approx 5 pm while working in garage began experiencing left sided chest pressure with radiation to the left arm and left cheek associated with SOB, nausea, denies dizziness. Nothing makes pressure better, walking makes pain worse. Pain is described as pressure and constant. Father died at 3 post MI.

## 2012-08-11 LAB — POCT I-STAT TROPONIN I: Troponin i, poc: 0 ng/mL (ref 0.00–0.08)

## 2012-08-11 MED ORDER — ACETAMINOPHEN 325 MG PO TABS
650.0000 mg | ORAL_TABLET | ORAL | Status: AC
  Administered 2012-08-11: 650 mg via ORAL
  Filled 2012-08-11: qty 2

## 2012-08-11 NOTE — ED Provider Notes (Signed)
  Medical screening examination/treatment/procedure(s) were performed by non-physician practitioner and as supervising physician I was immediately available for consultation/collaboration.  I discussed the patient's case during his evaluation here.  I discussed the results with the mid-level provider, and we advocated for admission for further evaluation and management given the patient's absence of cardiac assessment in the recent past.  However, the patient preferred discharge.  Given the patient's capacity to make this request, this was accommodated.  Gerhard Munch, MD 08/11/12 [phone]

## 2012-08-11 NOTE — ED Provider Notes (Signed)
History     CSN: [id number]  Arrival date & time 08/10/12  2000   First MD Initiated Contact with Patient 08/10/12 2211      Chief Complaint  Patient presents with  . Chest Pain    (Consider location/radiation/quality/duration/timing/severity/associated sxs/prior treatment) HPI Patient present to the emergency department with chest pain, that lasted for 3 hours.  Patient, states he was at home and the chest pain began around 4:00.  Patient denies nausea, vomiting, abdominal pain, back pain, blurred vision, headache, fever, cough,dizziness weakness, syncope, shortness of breath, or diarrhea. patient states he did not take any medications prior to arrival.patient states the pain subsided.  Patient, states he did not have any exertional symptoms. Past Medical History  Diagnosis Date  . Hypertension     No past surgical history on file.  No family history on file.  History  Substance Use Topics  . Smoking status: Never Smoker   . Smokeless tobacco: Not on file  . Alcohol Use: Yes      Review of Systems All other systems negative except as documented in the HPI. All pertinent positives and negatives as reviewed in the HPI.  Allergies  Nsaids and Penicillins  Home Medications   Current Outpatient Rx  Name  Route  Sig  Dispense  Refill  . cetirizine (ZYRTEC) 10 MG tablet   Oral   Take 10 mg by mouth daily.         . Cholecalciferol 5000 UNITS TABS   Oral   Take 1 tablet by mouth See admin instructions. Monday through Friday         . diclofenac sodium (VOLTAREN) 1 % GEL   Topical   Apply 2 g topically 4 (four) times daily as needed (pain).          . hydrochlorothiazide (HYDRODIURIL) 12.5 MG tablet   Oral   Take 12.5 mg by mouth daily.         [name] levothyroxine (SYNTHROID, LEVOTHROID) 175 MCG tablet   Oral   Take 175 mcg by mouth daily before breakfast.         . mometasone (NASONEX) 50 MCG/ACT nasal spray   Nasal   Place 2 sprays into the nose  daily as needed (allergies).          [name] testosterone (ANDROGEL) 50 MG/5GM GEL   Transdermal   Place 5 g onto the skin daily.         . valsartan (DIOVAN) 160 MG tablet   Oral   Take 160 mg by mouth daily.           BP 134/85  Pulse 72  Temp(Src) 98.7 F (37.1 C) (Oral)  Resp 18  SpO2 96%  Physical Exam  Nursing note and vitals reviewed. Constitutional: He is oriented to person, place, and time. He appears well-developed and well-nourished. No distress.  HENT:  Head: Normocephalic and atraumatic.  Mouth/Throat: Oropharynx is clear and moist.  Eyes: Pupils are equal, round, and reactive to light.  Neck: Normal range of motion. Neck supple.  Cardiovascular: Normal rate, regular rhythm and normal heart sounds.  Exam reveals no gallop and no friction rub.   No murmur heard. Pulmonary/Chest: Effort normal and breath sounds normal. No respiratory distress. He has no wheezes.  Abdominal: Soft. Bowel sounds are normal.  Musculoskeletal: Normal range of motion. He exhibits no edema.  Neurological: He is alert and oriented to person, place, and time. He exhibits normal muscle tone. Coordination normal.  Skin:  Skin is warm and dry. No rash noted.    ED Course  Procedures (including critical care time)  Labs Reviewed  CBC - Abnormal; Notable for the following:    WBC 11.1 (*)    Hemoglobin 17.1 (*)    All other components within normal limits  BASIC METABOLIC PANEL - Abnormal; Notable for the following:    Glucose, Bld 110 (*)    GFR calc non Af Amer 87 (*)    All other components within normal limits  POCT I-STAT TROPONIN I  POCT I-STAT TROPONIN I   Dg Chest 2 View  08/10/2012   *RADIOLOGY REPORT*  Clinical Data: Chest pain  CHEST - 2 VIEW  Comparison: July 27, 2006.  Findings: Cardiomediastinal silhouette appears normal.  No acute pulmonary disease is noted.  Bony thorax is intact.  IMPRESSION: No acute cardiopulmonary abnormality seen.   Original Report Authenticated  By: Lupita Raider.,  M.D.   Mr Shoulder Left Wo Contrast  08/10/2012   *RADIOLOGY REPORT*  Clinical Data:  Left shoulder pain and limited range of motion.  MRI OF THE LEFT SHOULDER WITHOUT CONTRAST  Technique:  Multiplanar, multisequence MR imaging of the  left shoulder was performed.  No intravenous contrast was administered.  Comparison:  None  FINDINGS: Rotator cuff:  Intact. Muscles:  Normal.  No atrophy or edema. Biceps long head:  Properly located and intact.  There is debris or synovial thickening in the tendon sheath.  Acromioclavicular Joint:  Slight arthritis.  Normal type 1 acromion.  No bursitis. Glenohumeral Joint:  Prominent glenohumeral joint effusion with multiple small rod-like loose bodies in the joint.  Small cystic degenerative changes at the 12 o'clock position of the glenoid.  Labrum:  No visible tear.  The labrum is not well seen on the coronal images due to motion artifact. Bones:  Slight degenerative changes of the [facility] joint and of the superior aspect of the glenoid.  IMPRESSION:  1.  Findings consistent with synovitis of the glenohumeral joint with multiple loose bodies and synovial hypertrophy.  Does the patient have rheumatoid arthritis or other systemic arthritis? 2.  Intact rotator cuff. 3.  Slight degenerative changes of the glenohumeral joint.   Original Report Authenticated By: Francene Boyers, M.D.   Patient was offered admission to the hospital, which he declined.  The patient, states he will follow up with his primary care Dr. For further evaluation and recheck.  Patient is advised return here for any worsening in his condition.I advised the patient is currently is hard, but without further testing this would be difficult to ascertain.patient voices an understanding and again will followup with his doctor and return here for any worsening in his condition    MDM   Date: 08/11/2012  Rate: 80  Rhythm: normal sinus rhythm  QRS Axis: normal  Intervals: normal  ST/T Wave  abnormalities: normal  Conduction Disutrbances:none  Narrative Interpretation:   Old EKG Reviewed: unchanged          Carlyle Dolly, PA-C 08/11/12 [phone]

## 2012-08-28 ENCOUNTER — Telehealth: Payer: Self-pay | Admitting: Internal Medicine

## 2012-08-28 NOTE — Telephone Encounter (Signed)
Per Dr. Felicity Coyer ok to work pt in as a new pt.

## 2012-09-05 ENCOUNTER — Ambulatory Visit: Payer: BC Managed Care – PPO | Admitting: Internal Medicine

## 2012-09-06 ENCOUNTER — Ambulatory Visit (INDEPENDENT_AMBULATORY_CARE_PROVIDER_SITE_OTHER): Payer: BC Managed Care – PPO | Admitting: Internal Medicine

## 2012-09-06 ENCOUNTER — Encounter: Payer: Self-pay | Admitting: Internal Medicine

## 2012-09-06 VITALS — BP 128/78 | HR 63 | Temp 98.4°F | Ht 70.0 in | Wt 302.8 lb

## 2012-09-06 DIAGNOSIS — E785 Hyperlipidemia, unspecified: Secondary | ICD-10-CM | POA: Insufficient documentation

## 2012-09-06 DIAGNOSIS — E669 Obesity, unspecified: Secondary | ICD-10-CM

## 2012-09-06 DIAGNOSIS — N400 Enlarged prostate without lower urinary tract symptoms: Secondary | ICD-10-CM

## 2012-09-06 DIAGNOSIS — I10 Essential (primary) hypertension: Secondary | ICD-10-CM

## 2012-09-06 DIAGNOSIS — I493 Ventricular premature depolarization: Secondary | ICD-10-CM

## 2012-09-06 DIAGNOSIS — I4949 Other premature depolarization: Secondary | ICD-10-CM

## 2012-09-06 DIAGNOSIS — R0789 Other chest pain: Secondary | ICD-10-CM

## 2012-09-06 HISTORY — DX: Benign prostatic hyperplasia without lower urinary tract symptoms: N40.0

## 2012-09-06 HISTORY — DX: Ventricular premature depolarization: I49.3

## 2012-09-06 MED ORDER — ASPIRIN EC 81 MG PO TBEC: 81.0000 mg | DELAYED_RELEASE_TABLET | Freq: Every day | ORAL | Status: DC

## 2012-09-06 MED ORDER — ATORVASTATIN CALCIUM 10 MG PO TABS: 10.0000 mg | ORAL_TABLET | Freq: Every day | ORAL | Status: DC

## 2012-09-06 MED ORDER — TADALAFIL 5 MG PO TABS: 5.0000 mg | ORAL_TABLET | Freq: Every day | ORAL | Status: DC

## 2012-09-06 NOTE — Progress Notes (Signed)
Subjective:    Patient ID: Jonathan Powers, male    DOB: 1950-09-25, 62 y.o.   MRN: [id number]  HPI New patient to me in my practice, here today to establish primary care provider  Substernal chest pain -Episode of chest pain prompting ER evaluation in June 2014 reviewed No recurrence of pain, but remains concerned for possible anginal equivalent. Chest pain was located substernal region, no radiation.  Not associated with diaphoresis or nausea; no shortness of breath, cough or change in GERD symptoms chest pain was exertional precipitated by repeated climbing exercises. Removed exercise treadmill test 2008 negative by report, no copy available Cardiac risk factors include obesity, hypertension, family history -no smoking, diabetes, dyslipidemia   Also notes BPH, request refill of daily Cialis prescription for control of symptoms  Past Medical History  Diagnosis Date  . Hypertension   . OSTEOARTHRITIS   . ONYCHOMYCOSIS   . OBESITY NOS   . HYPERTENSION   . CHOLECYSTECTOMY, LAPAROSCOPIC, HX OF   . CELLULITIS, LEG, RIGHT     Recurrent R hip cellulitis 1/08,3/08   . CARPAL TUNNEL RELEASE, BILATERAL, HX OF   . ALLERGIC RHINITIS   . Migraines   . Hypothyroid   . Diverticulitis   . GERD (gastroesophageal reflux disease)    Family History  Problem Relation Age of Onset  . Arthritis Mother   . Diabetes Mother   . Arthritis Father   . Heart disease Father   . Diabetes Father   . Cancer Paternal Grandmother   . Cancer Paternal Grandfather     stomach cancer   History  Substance Use Topics  . Smoking status: Never Smoker   . Smokeless tobacco: Not on file  . Alcohol Use: Yes   Review of Systems Constitutional: Negative for fever or weight change.  Respiratory: Negative for cough and shortness of breath.   Cardiovascular: Negative for recurrent chest pain or palpitations.  Gastrointestinal: Negative for abdominal pain, no bowel changes.  Musculoskeletal: Negative for gait  problem.  Skin: Negative for rash.  Neurological: Negative for dizziness or headache.  No other specific complaints in a complete review of systems (except as listed in HPI above).     Objective:   Physical Exam BP 128/78  Pulse 63  Temp(Src) 98.4 F (36.9 C) (Oral)  Ht 5\' 10"  (1.778 m)  Wt 302 lb 12.8 oz (137.349 kg)  BMI 43.45 kg/m2  SpO2 98% Wt Readings from Last 3 Encounters:  09/06/12 302 lb 12.8 oz (137.349 kg)  10/01/08 319 lb 2.1 oz (144.757 kg)  05/17/06 298 lb 3.2 oz (135.263 kg)   Constitutional: he is overweight, but appears well-developed and well-nourished. No distress.  HENT: Head: Normocephalic and atraumatic. Ears: B TMs ok, no erythema or effusion; Nose: Nose normal. Mouth/Throat: Oropharynx is clear and moist. No oropharyngeal exudate.  Eyes: Conjunctivae and EOM are normal. Pupils are equal, round, and reactive to light. No scleral icterus.  Neck: Thick. Normal range of motion. Neck supple. No JVD present. No thyromegaly present.  Cardiovascular: Normal rate, regular rhythm and normal heart sounds.  No murmur heard. No BLE edema. Pulmonary/Chest: Effort normal and breath sounds normal. No respiratory distress. he has no wheezes.  Abdominal: Soft. Bowel sounds are normal. he exhibits no distension. There is no tenderness. no masses Musculoskeletal: Normal range of motion, no joint effusions. No gross deformities Neurological: he is alert and oriented to person, place, and time. No cranial nerve deficit. Coordination, balance, strength, speech and gait are normal.  Skin: Skin is warm and dry. No rash noted. No erythema.  Psychiatric: he has a normal mood and affect. behavior is normal. Judgment and thought content normal.  Lab Results  Component Value Date   WBC 11.1* 08/10/2012   HGB 17.1* 08/10/2012   HCT 47.5 08/10/2012   PLT 244 08/10/2012   GLUCOSE 110* 08/10/2012   CHOL 182 02/23/2012   TRIG 176* 02/23/2012   HDL 40 02/23/2012   LDLCALC 161 02/23/2012   ALT 36  02/23/2012   AST 26 02/23/2012   NA 138 08/10/2012   K 3.6 08/10/2012   CL 97 08/10/2012   CREATININE 0.99 08/10/2012   BUN 11 08/10/2012   CO2 30 08/10/2012   TSH 1.33 02/23/2012   INR 2.4* 07/29/2006   HGBA1C 5.8 02/23/2012        Assessment & Plan:    See problem list. Medications and labs reviewed today.  Time spent with pt today 45 minutes, greater than 50% time spent counseling patient on chest pain, BPH/ED, potential OSA and need for further eval of same and medication review. Also review of prior records as brought with patient today and copied into EMR

## 2012-09-06 NOTE — Assessment & Plan Note (Signed)
LDL > 100 on last CPX review Given other cardiac risk factors and ongoing risk stratification for potential CAD, will begin atorvastatin 10 mg daily (reports previous trial of pravastatin caused myalgias)

## 2012-09-06 NOTE — Patient Instructions (Signed)
It was good to see you today. We have reviewed your prior records including labs and tests today Medications reviewed and updated, Begin aspirin 81 mg daily and atorvastatin 10 mg daily - Refill on Cialis as discussed for your prostate Your prescription(s) have been submitted to your pharmacy. Please take as directed and contact our office if you believe you are having problem(s) with the medication(s). Test(s) ordered today for 2D echo and cardiac stress test . Our office will contact you regarding appointment(s) once made. Also Your results will be released to MyChart (or called to you) after review, usually within 72hours after test completion. If any changes need to be made, you will be notified at that same time. we'll make referral to Dr Shirlee Latch . Our office will contact you regarding appointment(s) once made. Please schedule followup in 3-6 months for thyroid and cholesterol check, call sooner if problems. Consider evaluation for possible sleep apnea as discussed at next visit, call sooner if problems

## 2012-09-06 NOTE — Assessment & Plan Note (Signed)
BP Readings from Last 3 Encounters:  09/06/12 128/78  08/11/12 135/83  10/19/08 118/70   The current medical regimen is effective;  continue present plan and medications.

## 2012-09-06 NOTE — Assessment & Plan Note (Signed)
Episode of chest pain June 2014 prompting emergency room evaluation for same ECG with PVCs, known history of symptomatic PVCs but no ischemic change Recommended for additional cardiac stress testing because of risk factors, patient has delayed same until now No recurrence of chest pain in the interim. Asymptomatic today Recommend addition of aspirin 81 mg daily, patient will call if adverse side effects occur and discontinue same if problem Refer to cardiology for review and risk stratification, specifically request Dr. Shirlee Latch Will arrange for echo stress as unable to use treadmill with current knee/arthritis problems Also arrange for a 2-D echo given symptomatic PVCs to exclude underlying valve disease Continue treatment of comorbid risk factors for CAD: will begin statin for dyslipidemia, see below

## 2012-09-06 NOTE — Assessment & Plan Note (Signed)
Patient reports symptoms well controlled on daily Cialis Refill on same provided

## 2012-09-06 NOTE — Assessment & Plan Note (Signed)
Wt Readings from Last 3 Encounters:  09/06/12 302 lb 12.8 oz (137.349 kg)  10/01/08 319 lb 2.1 oz (144.757 kg)  05/17/06 298 lb 3.2 oz (135.263 kg)   The patient is asked to make an attempt to improve diet and exercise patterns to aid in medical management of this problem. Also discussed potential association with suspected sleep apnea as patient endorses snoring Recommend further evaluation with sleep study which patient declines at this time Reminded patient of association with heart disease and hypertension and encouraged appropriate diagnostics and treatment of potential underlying disease to reduce risk of cardiac issues. Patient understands and agrees to consider at future time

## 2012-09-10 ENCOUNTER — Telehealth: Payer: Self-pay | Admitting: *Deleted

## 2012-09-10 ENCOUNTER — Other Ambulatory Visit (HOSPITAL_COMMUNITY): Payer: Self-pay | Admitting: Internal Medicine

## 2012-09-10 DIAGNOSIS — E785 Hyperlipidemia, unspecified: Secondary | ICD-10-CM

## 2012-09-10 DIAGNOSIS — R079 Chest pain, unspecified: Secondary | ICD-10-CM

## 2012-09-10 DIAGNOSIS — I493 Ventricular premature depolarization: Secondary | ICD-10-CM

## 2012-09-10 DIAGNOSIS — I10 Essential (primary) hypertension: Secondary | ICD-10-CM

## 2012-09-10 NOTE — Telephone Encounter (Signed)
Received call from cardiology the 2-D echocardiogram didn't get schedule. Need to have this test before stress test @ 3:00. Also wan to clarify md order stress test but in notes states pt can't walk. Does md want pt to have a dobutamine stress test. Also pt was schedule to see Dr. Crenshaw aug 1st instead of Dr. Shirlee Latch is this ok...[name]

## 2012-09-10 NOTE — Telephone Encounter (Signed)
Called Patsy gave md response...lmb

## 2012-09-10 NOTE — Telephone Encounter (Signed)
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

## 2012-09-10 NOTE — Telephone Encounter (Signed)
Spoke with Jonathan Powers she states the 2- D ECHO IS NOT SHOWING UP ON THERE QUE...[name]

## 2012-09-11 ENCOUNTER — Ambulatory Visit (HOSPITAL_COMMUNITY): Payer: BC Managed Care – PPO | Attending: Internal Medicine

## 2012-09-11 ENCOUNTER — Other Ambulatory Visit (HOSPITAL_COMMUNITY): Payer: Self-pay | Admitting: *Deleted

## 2012-09-11 ENCOUNTER — Ambulatory Visit (HOSPITAL_BASED_OUTPATIENT_CLINIC_OR_DEPARTMENT_OTHER): Payer: BC Managed Care – PPO

## 2012-09-11 ENCOUNTER — Other Ambulatory Visit (HOSPITAL_COMMUNITY): Payer: BC Managed Care – PPO

## 2012-09-11 ENCOUNTER — Encounter: Payer: Self-pay | Admitting: Internal Medicine

## 2012-09-11 DIAGNOSIS — E785 Hyperlipidemia, unspecified: Secondary | ICD-10-CM | POA: Insufficient documentation

## 2012-09-11 DIAGNOSIS — E669 Obesity, unspecified: Secondary | ICD-10-CM | POA: Insufficient documentation

## 2012-09-11 DIAGNOSIS — I493 Ventricular premature depolarization: Secondary | ICD-10-CM

## 2012-09-11 DIAGNOSIS — R072 Precordial pain: Secondary | ICD-10-CM

## 2012-09-11 DIAGNOSIS — R079 Chest pain, unspecified: Secondary | ICD-10-CM

## 2012-09-11 DIAGNOSIS — R0789 Other chest pain: Secondary | ICD-10-CM

## 2012-09-11 DIAGNOSIS — R0989 Other specified symptoms and signs involving the circulatory and respiratory systems: Secondary | ICD-10-CM

## 2012-09-11 DIAGNOSIS — I10 Essential (primary) hypertension: Secondary | ICD-10-CM

## 2012-09-11 MED ORDER — SODIUM CHLORIDE 0.9 % IV SOLN
40.0000 ug/kg | Freq: Once | INTRAVENOUS | Status: AC
  Administered 2012-09-11: 40 ug/kg/min via INTRAVENOUS

## 2012-09-11 MED ORDER — PERFLUTREN PROTEIN A MICROSPH IV SUSP
5.0000 mL | Freq: Once | INTRAVENOUS | Status: AC
  Administered 2012-09-11: 5 mL via INTRAVENOUS

## 2012-09-11 NOTE — Progress Notes (Signed)
Dobutamine Stress Echocardiogram performed with [facility].

## 2012-09-11 NOTE — Progress Notes (Signed)
Echocardiogram performed with [facility].  

## 2012-09-16 ENCOUNTER — Ambulatory Visit (INDEPENDENT_AMBULATORY_CARE_PROVIDER_SITE_OTHER): Payer: BC Managed Care – PPO | Admitting: Cardiology

## 2012-09-16 ENCOUNTER — Encounter: Payer: Self-pay | Admitting: Cardiology

## 2012-09-16 VITALS — BP 124/90 | HR 70 | Ht 70.0 in | Wt 303.1 lb

## 2012-09-16 DIAGNOSIS — E785 Hyperlipidemia, unspecified: Secondary | ICD-10-CM

## 2012-09-16 DIAGNOSIS — I493 Ventricular premature depolarization: Secondary | ICD-10-CM

## 2012-09-16 DIAGNOSIS — I4949 Other premature depolarization: Secondary | ICD-10-CM

## 2012-09-16 DIAGNOSIS — I10 Essential (primary) hypertension: Secondary | ICD-10-CM

## 2012-09-16 DIAGNOSIS — R0789 Other chest pain: Secondary | ICD-10-CM

## 2012-09-16 NOTE — Patient Instructions (Addendum)
Your physician has requested that you have a lexiscan myoview. For further information please visit [email or web address] Please follow instruction sheet, as given.  Your physician wants you to follow-up in: 1 year with Dr Shirlee Latch. (July 2015). You will receive a reminder letter in the mail two months in advance. If you don't receive a letter, please call our office to schedule the follow-up appointment.

## 2012-09-18 NOTE — Progress Notes (Signed)
Patient ID: Jonathan Powers, male   DOB: 03-Sep-1950, 62 y.o.   MRN: [id number] PCP: Dr. Felicity Coyer  62 yo with history of HTN and hyperlipidemia presents for evaluation of chest pressure. On occasion, but not commonly, patient has had episodes of central chest pressure.  These would typically be somewhat transient.  One day in June, however, he was working in his garage.  He was going up and down a ladder a lot.  While working, he developed a dull ache in his central chest. This persisted for several hours.  No radiation.  He went to the ER for evaluation.  Symptoms resolved in the ER.  CXR was normal and cardiac enzymes negative.  He was discharged home.  Since then, he had 1 other episode of chest pressure while mowing the grass.  This episode only lasted about 10 minutes.  No exertional dyspnea.  He does feel palpitations/fluttering in his chest.  He has had documented PVCs before and thinks that this feels the same.  Also of note, he is going to be in need of arthroscopic shoulder and knee surgery soon.   Dr. Felicity Coyer set him up for a DSE that was nondiagnostic due to difficult images.   ECG (6/14): NSR, normal  Labs (1/14): LDL 107, HDL 40 Labs (6/14): K 3.6, creatinine 0.99  PMH: 1. Bilateral THR 2. OSA 3. HTN 4. Hyperlipidemia 5. BPH 6. H/o CCY 7. Hypothyroidism 8. GERD 9. PVCs 10. Chest pain: Echo (7/14) with EF 60%, mild LVH, mildly decreased RV systolic function, normal RV size.  DSE (7/14): Nondiagnostic, walls not seen well with stress. LHC in 2002 or 2003 was normal per patient.   SH: Nonsmoker, works as Sales executive PA, married, 3 daughters, learning to fly.  FH: Father with MI at 82  ROS: All systems reviewed and negative except as per HPI.   Current Outpatient Prescriptions  Medication Sig Dispense Refill  . aspirin EC 81 MG tablet Take 1 tablet (81 mg total) by mouth daily.  150 tablet  2  . atorvastatin (LIPITOR) 10 MG tablet Take 1 tablet (10 mg total) by mouth daily.   90 tablet  3  . cetirizine (ZYRTEC) 10 MG tablet Take 10 mg by mouth daily.      . diclofenac sodium (VOLTAREN) 1 % GEL Apply 2 g topically 4 (four) times daily as needed (pain).       . hydrochlorothiazide (HYDRODIURIL) 12.5 MG tablet Take 12.5 mg by mouth daily.      [name] levothyroxine (SYNTHROID, LEVOTHROID) 175 MCG tablet Take 175 mcg by mouth daily before breakfast.      . mometasone (NASONEX) 50 MCG/ACT nasal spray Place 2 sprays into the nose daily as needed (allergies).       . tadalafil (CIALIS) 5 MG tablet Take 1 tablet (5 mg total) by mouth daily.  30 tablet  5  . testosterone (ANDROGEL) 50 MG/5GM GEL Place 5 g onto the skin daily.      . valsartan (DIOVAN) 160 MG tablet Take 160 mg by mouth daily.      . Cholecalciferol 5000 UNITS TABS Take 1 tablet by mouth See admin instructions. Monday through Friday       No current facility-administered medications for this visit.    BP 124/90  Pulse 70  Ht 5\' 10"  (1.778 m)  Wt 137.494 kg (303 lb 1.9 oz)  BMI 43.49 kg/m2  SpO2 96% General: NAD, overweight Neck: No JVD, no thyromegaly or thyroid nodule.  Lungs:  Clear to auscultation bilaterally with normal respiratory effort. CV: Nondisplaced PMI.  Heart regular S1/S2, no S3/S4, no murmur.  No peripheral edema.  No carotid bruit.  Normal pedal pulses.  Abdomen: Soft, nontender, no hepatosplenomegaly, no distention.  Skin: Intact without lesions or rashes.  Neurologic: Alert and oriented x 3.  Psych: Normal affect. Extremities: No clubbing or cyanosis.  HEENT: Normal.   Assessment/Plan: 1. Chest pain: Pain has typical and atypical features.  One prolonged episode.  RFs = HTN, hyperlipidemia, and family history.  He had a nondiagnostic DSE.  I will arrange for Lexiscan Sestamibi for risk stratification.  He will continue ASA 81 and statin.  2. Hyperlipidemia: Patient is on atorvastatin, followed by Dr. Felicity Coyer. 3. HTN: BP appears to be reasonably controlled on current regimen.  4.  Palpitations: Suspect PVCs based on past symptoms (noted in the past).  He does not drink caffeine.  They are present but manageable, does not feel like he needs to start beta blocker.  We talked about holter monitor but will hold off because symptoms are very similar to the past when PVCs were documented.    Marca Ancona 09/18/2012

## 2012-09-20 ENCOUNTER — Ambulatory Visit: Payer: BC Managed Care – PPO | Admitting: Cardiology

## 2012-09-30 ENCOUNTER — Ambulatory Visit (HOSPITAL_COMMUNITY): Payer: BC Managed Care – PPO | Attending: Internal Medicine | Admitting: Radiology

## 2012-09-30 ENCOUNTER — Encounter (HOSPITAL_COMMUNITY): Payer: BC Managed Care – PPO

## 2012-09-30 VITALS — BP 122/81 | HR 56 | Ht 71.0 in | Wt 302.0 lb

## 2012-09-30 DIAGNOSIS — R0602 Shortness of breath: Secondary | ICD-10-CM

## 2012-09-30 DIAGNOSIS — R079 Chest pain, unspecified: Secondary | ICD-10-CM

## 2012-09-30 DIAGNOSIS — R51 Headache: Secondary | ICD-10-CM

## 2012-09-30 DIAGNOSIS — I10 Essential (primary) hypertension: Secondary | ICD-10-CM | POA: Insufficient documentation

## 2012-09-30 DIAGNOSIS — I4949 Other premature depolarization: Secondary | ICD-10-CM | POA: Insufficient documentation

## 2012-09-30 DIAGNOSIS — R0789 Other chest pain: Secondary | ICD-10-CM | POA: Insufficient documentation

## 2012-09-30 MED ORDER — TECHNETIUM TC 99M SESTAMIBI GENERIC - CARDIOLITE
33.0000 | Freq: Once | INTRAVENOUS | Status: AC | PRN
  Administered 2012-09-30: 33 via INTRAVENOUS

## 2012-09-30 MED ORDER — REGADENOSON 0.4 MG/5ML IV SOLN
0.4000 mg | Freq: Once | INTRAVENOUS | Status: AC
  Administered 2012-09-30: 0.4 mg via INTRAVENOUS

## 2012-09-30 MED ORDER — AMINOPHYLLINE 25 MG/ML IV SOLN
75.0000 mg | Freq: Once | INTRAVENOUS | Status: AC
  Administered 2012-09-30: 75 mg via INTRAVENOUS

## 2012-09-30 NOTE — Progress Notes (Addendum)
[facility] SITE 3 NUCLEAR MED [address] [place], [place] [address] [phone]    Cardiology Nuclear Med Study  Jonathan Powers is a 62 y.o. male     MRN : [id number]     DOB: 1950/03/05  Procedure Date: 09/30/2012  Nuclear Med Background Indication for Stress Test:  Evaluation for Ischemia History:No prior known history of CAD; '02 Myocardial Perfusion Study-negative for ischemia or fixed defect, EF=55% [facility]) '02 Cath: normal per patient; 09-11-12 Echo: EF=60%, mild LVH, and 09-11-12 Dobutamine Echo: Non-diagnostic, difficult images Cardiac Risk Factors: Family History - CAD, Hypertension and Lipids  Symptoms: Chest Pressure/Tightness with/without exertion (last occurrence 3 days),  Dizziness, DOE, Fatigue, Fatigue with Exertion, Near Syncope and Palpitations   Nuclear Pre-Procedure Caffeine/Decaff Intake:  None > 12 hrs NPO After: 9:00pm   Lungs:  clear O2 Sat: 97% on room air. IV 0.9% NS with Angio Cath:  20g  IV Site: R Antecubital x 1, tolerated well IV Started by:  Jonathan Hong, RN  Chest Size (in):  52 Cup Size: n/a  Height: 5\' 11"  (1.803 m)  Weight:  302 lb (136.986 kg)  BMI:  Body mass index is 42.14 kg/(m^2). Tech Comments:  Diovan last night    Nuclear Med Study 1 or 2 day study: 2 day  Stress Test Type:  Treadmill/Lexiscan  Reading MD: Jonathan Pates, MD  Order Authorizing Provider:  Marca Ancona, MD  Resting Radionuclide: Technetium 63m Sestamibi  Resting Radionuclide Dose: 33.0 mCi  10/03/12  Stress Radionuclide:  Technetium 53m Sestamibi  Stress Radionuclide Dose: 33.0 mCi   09/30/12          Stress Protocol Rest HR: 56 Stress HR: 94  Rest BP: 122/81 Stress BP: 153/79  Exercise Time (min): 2:00 METS: n/a   Predicted Max HR: 160 bpm % Max HR: 58.75 bpm Rate Pressure Product: 21308   Dose of Adenosine (mg):  n/a Dose of Lexiscan: 0.4 mg  Dose of Atropine (mg): n/a Dose of Dobutamine: n/a mcg/kg/min (at max HR)  Stress Test Technologist:  Jonathan Hong, RN  Nuclear Technologist:  Jonathan Powers, CNMT     Rest Procedure:  Myocardial perfusion imaging was performed at rest 45 minutes following the intravenous administration of Technetium 20m Sestamibi. Rest ECG: NSR - Normal EKG  Stress Procedure:  The patient received IV Lexiscan 0.4 mg over 15-seconds with concurrent low level exercise and then Technetium 45m Sestamibi was injected at 30-seconds while the patient continued walking one more minute. The patient complained of chest tightness, SOB, nausea, and headache.  Aminophylline 75 mg IVP given post recovery due to persistent headache with quick resolution of symptoms. Quantitative spect images were obtained after a 45-minute delay. Stress ECG: No significant change from baseline ECG  QPS Raw Data Images:  There is mild motion that may affect the intrepretation of the study. Stress Images:  There is mild apical thinning with normal uptake in other regions. Rest Images:  There is mild apical thinning with normal uptake in other regions. Subtraction (SDS):  No reversibility is appreciated. Transient Ischemic Dilatation (Normal <1.22):  n/a Lung/Heart Ratio (Normal <0.45):  0.37  Quantitative Gated Spect Images QGS EDV:  131 ml QGS ESV:  66 ml  Impression Exercise Capacity:  Lexiscan with low level exercise. BP Response:  Normal blood pressure response. Clinical Symptoms:  No significant symptoms noted. ECG Impression:  No significant ST segment change suggestive of ischemia. Comparison with Prior Nuclear Study: No images to compare  Overall  Impression:  Low risk stress nuclear study There is mild apical thinning but no clear cut ischemia or infarction.  .  LV Ejection Fraction: 50%.  LV Wall Motion:  NL LV Function; NL Wall Motion.   Jonathan Powers, Jonathan Powers., MD, Jonathan Powers 10/03/2012, 5:36 PM Office - [phone] Pager [phone]  Probably normal study with some attenuation, but EF slightly low.  Would get echo to reassess EF.    Jonathan Powers 10/04/2012

## 2012-10-01 ENCOUNTER — Encounter (HOSPITAL_COMMUNITY): Payer: BC Managed Care – PPO

## 2012-10-03 ENCOUNTER — Ambulatory Visit (HOSPITAL_COMMUNITY): Payer: BC Managed Care – PPO | Attending: Cardiology

## 2012-10-03 DIAGNOSIS — R0989 Other specified symptoms and signs involving the circulatory and respiratory systems: Secondary | ICD-10-CM

## 2012-10-03 MED ORDER — TECHNETIUM TC 99M SESTAMIBI GENERIC - CARDIOLITE
33.0000 | Freq: Once | INTRAVENOUS | Status: AC | PRN
  Administered 2012-10-03: 33 via INTRAVENOUS

## 2012-10-04 ENCOUNTER — Telehealth: Payer: Self-pay | Admitting: *Deleted

## 2012-10-04 NOTE — Telephone Encounter (Signed)
Per note at bottom of stress myo / you would like an echo ordered, one was done 09/11/12. Please advise.

## 2012-10-04 NOTE — Progress Notes (Signed)
Dr Shirlee Latch was sent a note to see if he wants another Echo ordered.

## 2012-10-06 NOTE — Telephone Encounter (Signed)
EF normal by echo, suspect this is more accurate than myoview EF.  No evidence for ischemia or infarction on myoview.

## 2012-10-07 NOTE — Progress Notes (Signed)
Pt notified of myoview results and echo results from July 2014.

## 2012-10-07 NOTE — Progress Notes (Signed)
See phone note 10/04/12.

## 2012-10-07 NOTE — Telephone Encounter (Signed)
LMTCB

## 2012-10-07 NOTE — Telephone Encounter (Signed)
Pt.notified

## 2012-11-11 ENCOUNTER — Encounter: Payer: Self-pay | Admitting: Internal Medicine

## 2012-11-11 MED ORDER — SILDENAFIL CITRATE 100 MG PO TABS
50.0000 mg | ORAL_TABLET | Freq: Every day | ORAL | Status: DC | PRN
Start: 2012-11-11 — End: 2012-11-11

## 2012-11-11 MED ORDER — SILDENAFIL CITRATE 100 MG PO TABS: 50.0000 mg | ORAL_TABLET | Freq: Every day | ORAL | Status: DC | PRN

## 2012-11-18 ENCOUNTER — Other Ambulatory Visit: Payer: Self-pay | Admitting: *Deleted

## 2012-11-18 MED ORDER — SILDENAFIL CITRATE 100 MG PO TABS: 50.0000 mg | ORAL_TABLET | Freq: Every day | ORAL | Status: DC | PRN

## 2012-11-18 NOTE — Telephone Encounter (Signed)
Received email pt is needing refill on his viagra. Pharmacy never received first script...lmb

## 2012-12-11 ENCOUNTER — Encounter: Payer: Self-pay | Admitting: Internal Medicine

## 2012-12-11 ENCOUNTER — Ambulatory Visit (INDEPENDENT_AMBULATORY_CARE_PROVIDER_SITE_OTHER): Payer: BC Managed Care – PPO | Admitting: Internal Medicine

## 2012-12-11 VITALS — BP 132/70 | HR 62 | Temp 98.6°F | Wt 307.0 lb

## 2012-12-11 DIAGNOSIS — Z79899 Other long term (current) drug therapy: Secondary | ICD-10-CM

## 2012-12-11 DIAGNOSIS — E785 Hyperlipidemia, unspecified: Secondary | ICD-10-CM

## 2012-12-11 DIAGNOSIS — E669 Obesity, unspecified: Secondary | ICD-10-CM

## 2012-12-11 DIAGNOSIS — E039 Hypothyroidism, unspecified: Secondary | ICD-10-CM | POA: Insufficient documentation

## 2012-12-11 DIAGNOSIS — Z23 Encounter for immunization: Secondary | ICD-10-CM

## 2012-12-11 DIAGNOSIS — I10 Essential (primary) hypertension: Secondary | ICD-10-CM

## 2012-12-11 NOTE — Assessment & Plan Note (Signed)
LDL > 100 on last CPX review Given other cardiac risk factors and ongoing risk stratification for potential CAD, began atorvastatin 10 mg daily 08/2012 (reports previous trial of pravastatin caused myalgias) Check now, titrate as needed

## 2012-12-11 NOTE — Assessment & Plan Note (Signed)
Wt Readings from Last 3 Encounters:  12/11/12 307 lb (139.254 kg)  09/30/12 302 lb (136.986 kg)  09/16/12 303 lb 1.9 oz (137.494 kg)   The patient is asked to make an attempt to improve diet and exercise patterns to aid in medical management of this problem. Also discussed potential association of hypertension with suspected sleep apnea as patient endorses snoring Recommend further evaluation with sleep study which patient agrees to consider at this time - refer done

## 2012-12-11 NOTE — Patient Instructions (Signed)
It was good to see you today.  Your annual flu shot was given and/or updated today.  We have reviewed your prior records including labs and tests today  Medications reviewed and updated, no changes recommended at this time.  Test(s) ordered today. Your results will be released to MyChart (or called to you) after review, usually within 72hours after test completion. If any changes need to be made, you will be notified at that same time.  Please schedule followup in 6 months, call sooner if problems.

## 2012-12-11 NOTE — Assessment & Plan Note (Signed)
Lab Results  Component Value Date   TSH 1.33 02/23/2012   The current medical regimen is effective;  continue present plan and medications.

## 2012-12-11 NOTE — Assessment & Plan Note (Signed)
BP Readings from Last 3 Encounters:  12/11/12 132/70  09/30/12 122/81  09/16/12 124/90   The current medical regimen is effective;  continue present plan and medications.

## 2012-12-11 NOTE — Progress Notes (Signed)
  Subjective:    Patient ID: Jonathan Powers, male    DOB: [birth year], 62 y.o.   MRN: [id number]  HPI  Patient here today for follow up - chronic medical issues reviewed -  HTN - well controlled on Diovan and prn HCTZ. Pt denies adverse symptoms related to same.  No headaches, dizziness.   Hyperlipidemia - initiated on low dose Lipitor at last visit 08/2012.  Denies adverse symptoms on therapy.  Reports compliance with current therapy  Hypothyroidism - maintained on Synthroid.  Denies adverse symptoms on therapy.  Reports compliance with current therapy.    Chest pain, not ischemic or cardiac as evaluated with lexiscan myoview 08/2012 and seen by cardiology Shirlee Latch).  Myoview normal, EF 50%.  Follow up with cardiology scheduled for 08/2013.  Past Medical History  Diagnosis Date  . Hypertension   . OSTEOARTHRITIS   . OBESITY NOS   . HYPERTENSION   . CHOLECYSTECTOMY, LAPAROSCOPIC, HX OF   . CELLULITIS, LEG, RIGHT     Recurrent R hip cellulitis 1/08,3/08   . CARPAL TUNNEL RELEASE, BILATERAL, HX OF   . ALLERGIC RHINITIS   . Migraines     Atypical and ocular  . Hypothyroid   . Diverticulitis   . GERD (gastroesophageal reflux disease)   . Gout   . Hypogonadism male     a/w ED     Review of Systems  Constitutional: Negative for fever, chills, activity change and appetite change.  HENT: Negative.   Respiratory: Negative for apnea, cough, chest tightness and shortness of breath.   Cardiovascular: Positive for chest pain (stable). Negative for palpitations and leg swelling.  Gastrointestinal: Negative for nausea, vomiting, diarrhea and constipation.  Skin: Negative for rash.  Neurological: Negative for dizziness, syncope and headaches.       Objective:   Physical Exam  Constitutional: He is oriented to person, place, and time. He appears well-developed and well-nourished. No distress.  Neck: Normal range of motion. Neck supple. No thyromegaly present.  Cardiovascular: Normal  rate, regular rhythm and normal heart sounds.   Pulmonary/Chest: Effort normal and breath sounds normal. No respiratory distress. He has no wheezes.  Lymphadenopathy:    He has no cervical adenopathy.  Neurological: He is alert and oriented to person, place, and time.  Skin: Skin is warm and dry. No rash noted. He is not diaphoretic.  Psychiatric: He has a normal mood and affect. His behavior is normal. Judgment and thought content normal.    Wt Readings from Last 3 Encounters:  12/11/12 307 lb (139.254 kg)  09/30/12 302 lb (136.986 kg)  09/16/12 303 lb 1.9 oz (137.494 kg)   BP Readings from Last 3 Encounters:  12/11/12 132/70  09/30/12 122/81  09/16/12 124/90      Assessment & Plan:   See problem list. Medications and labs reviewed today.  Daytime fatigue, non restoritive sleep - suspect underlying undiagnosed sleep apnea based on body habitus. In setting of hypertension, will refer to sleep specialist for evaluation of potential options of treatment prior to arranging sleep study.

## 2012-12-20 ENCOUNTER — Institutional Professional Consult (permissible substitution): Payer: BC Managed Care – PPO | Admitting: Internal Medicine

## 2012-12-21 ENCOUNTER — Encounter: Payer: Self-pay | Admitting: Internal Medicine

## 2012-12-23 MED ORDER — TESTOSTERONE 20.25 MG/ACT (1.62%) TD GEL: 3.0000 | Freq: Every day | TRANSDERMAL | Status: DC

## 2013-01-20 ENCOUNTER — Other Ambulatory Visit: Payer: Self-pay | Admitting: Internal Medicine

## 2013-01-27 ENCOUNTER — Encounter: Payer: Self-pay | Admitting: Pulmonary Disease

## 2013-01-27 ENCOUNTER — Ambulatory Visit (INDEPENDENT_AMBULATORY_CARE_PROVIDER_SITE_OTHER): Payer: BC Managed Care – PPO | Admitting: Pulmonary Disease

## 2013-01-27 VITALS — BP 106/72 | HR 68 | Temp 98.2°F | Ht 70.0 in | Wt 312.2 lb

## 2013-01-27 DIAGNOSIS — G4733 Obstructive sleep apnea (adult) (pediatric): Secondary | ICD-10-CM

## 2013-01-27 HISTORY — DX: Obstructive sleep apnea (adult) (pediatric): G47.33

## 2013-01-27 NOTE — Patient Instructions (Signed)
Will try dymista one each nostril am and pm in the place of zyrtec and nasocort. Will schedule for home sleep testing, and will call you with results. Work on weight loss.

## 2013-01-27 NOTE — Assessment & Plan Note (Signed)
The patient's history is suggestive of clinically significant sleep apnea. He is overweight with an abnormal upper airway, has been noted to have snoring and witnessed apneas, has frequent awakenings with nonrestorative sleep, and does have some abnormal sleepiness during his waking hours. It had a long discussion with him about sleep apnea, including its impact to his quality of life and cardiovascular health. He will need to have a sleep study for diagnosis, and I think he is a good candidate for home sleep testing.

## 2013-01-27 NOTE — Progress Notes (Signed)
Subjective:    Patient ID: Jonathan Powers, male    DOB: Jul 15, 1950, 62 y.o.   MRN: [id number]  HPI The patient is a 62 year old male who I've been asked to see for possible obstructive sleep apnea. He is overweight, and has been noted to have snoring as well as witnessed apneas by his bed partner. He has frequent awakenings at night, but he blames this on his diuretic that he takes in the evening. He is not rested in the mornings upon arising, and does have some inappropriate sleepiness during the day with inactivity. He denies any issues with sleepiness in the evening watching television or movies, and has no issues with sleepiness while driving unless on longer distances at night. The patient states that his weight is been fairly stable over the last few years, and his Epworth score today is 9.   Sleep Questionnaire What time do you typically go to bed?( Between what hours) 10-11p 10-11p at 1527 on 01/27/13 by Maisie Fus, CMA How long does it take you to fall asleep? 15-30min 15-20min at 1527 on 01/27/13 by Maisie Fus, CMA How many times during the night do you wake up? 3 3 at 1527 on 01/27/13 by Maisie Fus, CMA What time do you get out of bed to start your day? 0630 0630 at 1527 on 01/27/13 by Maisie Fus, CMA Do you drive or operate heavy machinery in your occupation? No No at 1527 on 01/27/13 by Maisie Fus, CMA How much has your weight changed (up or down) over the past two years? (In pounds) 0 oz (0 kg)0 oz (0 kg) stable at 1527 on 01/27/13 by Maisie Fus, CMA Have you ever had a sleep study before? No No at 1527 on 01/27/13 by Maisie Fus, CMA Do you currently use CPAP? No No at 1527 on 01/27/13 by Maisie Fus, CMA Do you wear oxygen at any time? No No at 1527 on 01/27/13 by Maisie Fus, CMA   Review of Systems  Constitutional: Negative for fever and unexpected weight change.  HENT: Positive for congestion, postnasal drip, rhinorrhea,  sinus pressure, sneezing and trouble swallowing. Negative for dental problem, ear pain, nosebleeds and sore throat.   Eyes: Negative for redness and itching.  Respiratory: Positive for cough ("dry hacky") and shortness of breath ( rest--talking alot during the day). Negative for chest tightness and wheezing.   Cardiovascular: Positive for palpitations. Negative for leg swelling.  Gastrointestinal: Negative for nausea and vomiting.  Genitourinary: Negative for dysuria.  Musculoskeletal: Positive for joint swelling.  Skin: Negative for rash.  Neurological: Positive for headaches.  Hematological: Does not bruise/bleed easily.  Psychiatric/Behavioral: Negative for dysphoric mood. The patient is not nervous/anxious.        Objective:   Physical Exam Constitutional:  Overweight male, no acute distress  HENT:  Nares patent without discharge, +congestion/erythema  Oropharynx without exudate, palate and uvula are elongated, +narrowing posteriorly.  Eyes:  Perrla, eomi, no scleral icterus  Neck:  No JVD, no TMG  Cardiovascular:  Normal rate, regular rhythm, no rubs or gallops.  No murmurs        Intact distal pulses  Pulmonary :  Normal breath sounds, no stridor or respiratory distress   No rales, rhonchi, or wheezing  Abdominal:  Soft, nondistended, bowel sounds present.  No tenderness noted.   Musculoskeletal:  No lower extremity edema noted.  Lymph Nodes:  No cervical lymphadenopathy noted  Skin:  No cyanosis  noted  Neurologic:  Alert, appropriate, moves all 4 extremities without obvious deficit.         Assessment & Plan:

## 2013-02-05 ENCOUNTER — Other Ambulatory Visit: Payer: Self-pay | Admitting: Sports Medicine

## 2013-02-05 DIAGNOSIS — M542 Cervicalgia: Secondary | ICD-10-CM

## 2013-02-15 ENCOUNTER — Other Ambulatory Visit: Payer: Self-pay | Admitting: Internal Medicine

## 2013-02-15 ENCOUNTER — Other Ambulatory Visit: Payer: BC Managed Care – PPO

## 2013-02-17 ENCOUNTER — Telehealth: Payer: Self-pay | Admitting: Pulmonary Disease

## 2013-02-17 NOTE — Telephone Encounter (Signed)
Faxed script [facility] to [facility].../lmb 

## 2013-02-17 NOTE — Telephone Encounter (Signed)
Spoke with pt informed sleep study approved by bc/bs.  Patient to come in tomorrow 02/18/13 around 4pm to pick up alice. Kandice Hams

## 2013-02-17 NOTE — Telephone Encounter (Signed)
Spoke with Bjorn Loser and she is checking on this. Will wait for her to let me know something

## 2013-02-18 ENCOUNTER — Telehealth: Payer: Self-pay | Admitting: Internal Medicine

## 2013-02-18 DIAGNOSIS — G4733 Obstructive sleep apnea (adult) (pediatric): Secondary | ICD-10-CM

## 2013-02-18 NOTE — Telephone Encounter (Signed)
02/18/2013  Pt came into office wanting to pick up rx for ANDROGEL PUMP 20.25 MG/ACT (1.62%) GEL

## 2013-02-19 ENCOUNTER — Ambulatory Visit
Admission: RE | Admit: 2013-02-19 | Discharge: 2013-02-19 | Disposition: A | Discharge status: Home / Self Care | Payer: BC Managed Care – PPO | Source: Ambulatory Visit | Attending: Sports Medicine | Admitting: Sports Medicine

## 2013-02-19 DIAGNOSIS — M542 Cervicalgia: Secondary | ICD-10-CM

## 2013-02-21 MED ORDER — TESTOSTERONE 20.25 MG/ACT (1.62%) TD GEL: 4.0000 | Freq: Every day | TRANSDERMAL | Status: DC

## 2013-02-21 NOTE — Telephone Encounter (Signed)
done

## 2013-02-22 ENCOUNTER — Encounter: Payer: Self-pay | Admitting: Pulmonary Disease

## 2013-02-24 ENCOUNTER — Telehealth: Payer: Self-pay | Admitting: Pulmonary Disease

## 2013-02-24 NOTE — Telephone Encounter (Signed)
Have the pt's sleep study results come back? Thanks.

## 2013-02-24 NOTE — Telephone Encounter (Signed)
Please let pt know I just got back today, and will read his study as soon as I can.  Should be done next 24-48 hrs.

## 2013-02-24 NOTE — Telephone Encounter (Signed)
Dr Gwenette Greet, Please advise if you have seen these results. Patient is requesting. Thanks!

## 2013-02-27 ENCOUNTER — Telehealth: Payer: Self-pay | Admitting: Pulmonary Disease

## 2013-02-27 DIAGNOSIS — G4733 Obstructive sleep apnea (adult) (pediatric): Secondary | ICD-10-CM

## 2013-02-27 NOTE — Telephone Encounter (Signed)
Pt scheduled for appt with [facility] 03/11/13 at 430 Pt could not do any other date/time

## 2013-02-27 NOTE — Telephone Encounter (Signed)
LM on home# unable to leave vmail on cell d/t mailbox being full

## 2013-02-27 NOTE — Telephone Encounter (Signed)
Please let pt know that his sleep study shows severe sleep apnea, and he needs to come in to review and discuss treatment.  Need to see him fairly soon.

## 2013-02-28 ENCOUNTER — Encounter: Payer: Self-pay | Admitting: Pulmonary Disease

## 2013-03-11 ENCOUNTER — Ambulatory Visit (INDEPENDENT_AMBULATORY_CARE_PROVIDER_SITE_OTHER): Payer: BC Managed Care – PPO | Admitting: Pulmonary Disease

## 2013-03-11 ENCOUNTER — Encounter: Payer: Self-pay | Admitting: Pulmonary Disease

## 2013-03-11 VITALS — BP 130/78 | HR 68 | Ht 71.0 in | Wt 316.8 lb

## 2013-03-11 DIAGNOSIS — G4733 Obstructive sleep apnea (adult) (pediatric): Secondary | ICD-10-CM

## 2013-03-11 NOTE — Assessment & Plan Note (Signed)
The patient has severe obstructive and central sleep apnea by his home sleep test, and will need aggressive treatment with CPAP while working on weight loss. He is agreeable to this approach. I will set the patient up on cpap at a moderate pressure level to allow for desensitization, and will troubleshoot the device over the next 4-6weeks if needed.  The pt is to call me if having issues with tolerance.  Will then optimize the pressure once patient is able to wear cpap on a consistent basis.

## 2013-03-11 NOTE — Progress Notes (Signed)
   Subjective:    Patient ID: Jonathan Powers, male    DOB: 05-07-1950, 63 y.o.   MRN: [id number]  HPI The patient comes in today for followup after his recent home sleep test. He was found to have severe sleep apnea with an AHI of 68 events per hour. He was noted to have both central and obstructive events. I have reviewed the study with him in detail, and answered all of his questions.   Review of Systems  Constitutional: Negative for fever and unexpected weight change.  HENT: Positive for congestion, postnasal drip and sinus pressure. Negative for dental problem, ear pain, nosebleeds, rhinorrhea, sneezing, sore throat and trouble swallowing.   Eyes: Positive for itching. Negative for redness.  Respiratory: Negative for cough, chest tightness, shortness of breath and wheezing.   Cardiovascular: Positive for leg swelling. Negative for palpitations.  Gastrointestinal: Negative for nausea and vomiting.  Genitourinary: Negative for dysuria.  Musculoskeletal: Negative for joint swelling.  Skin: Negative for rash.  Neurological: Positive for headaches.  Hematological: Does not bruise/bleed easily.  Psychiatric/Behavioral: Negative for dysphoric mood. The patient is not nervous/anxious.        Objective:   Physical Exam Obese male in no acute distress Nose without purulence or discharge noted  Neck without lymphadenopathy or thyromegaly. Lower extremity without significant edema, no cyanosis Alert and oriented, moves all 4 extremities.               Assessment & Plan:

## 2013-03-11 NOTE — Patient Instructions (Signed)
Will start on cpap at a moderate pressure level.  Please call if having issues with tolerance Work on weight loss followup with me in 8 weeks.  

## 2013-04-15 ENCOUNTER — Other Ambulatory Visit: Payer: Self-pay | Admitting: Internal Medicine

## 2013-05-06 ENCOUNTER — Encounter: Payer: Self-pay | Admitting: Pulmonary Disease

## 2013-05-06 ENCOUNTER — Ambulatory Visit (INDEPENDENT_AMBULATORY_CARE_PROVIDER_SITE_OTHER): Payer: BC Managed Care – PPO | Admitting: Pulmonary Disease

## 2013-05-06 VITALS — BP 132/84 | HR 70 | Temp 98.1°F | Ht 71.0 in | Wt 314.0 lb

## 2013-05-06 DIAGNOSIS — G4733 Obstructive sleep apnea (adult) (pediatric): Secondary | ICD-10-CM

## 2013-05-06 MED ORDER — AZELASTINE HCL 0.1 % NA SOLN: 1.0000 | Freq: Two times a day (BID) | NASAL | Status: DC

## 2013-05-06 NOTE — Assessment & Plan Note (Addendum)
The patient has done fairly well with CPAP since the last visit, but does sometimes feel that 10 cm of pressure is not adequate. I have explained to him that we have yet to optimize his pressure, and will change him over to the automatic setting. I've also encouraged him to work aggressively on weight loss.  He is having some nasal dryness with blindness do it is uncomfortable for him, and we'll therefore try Astelin alone to see if this is better without the topical steroid.

## 2013-05-06 NOTE — Progress Notes (Signed)
   Subjective:    Patient ID: Jonathan Powers, male    DOB: 09-06-1950, 63 y.o.   MRN: [id number]  HPI The patient comes in today for followup of his obstructive sleep apnea. He was started on CPAP at the last visit, and has been tolerating the device well. He had to go to a nasal mask and nasal pillows because of his beard, and he does not feel that he has an issue with oral venting. He states that his wife has commented that he does not snore, but he really hasn't seen a big difference in how he feels during the day. I have reminded him that we have yet to optimize his pressure.   Review of Systems  Constitutional: Negative for fever and unexpected weight change.  HENT: Positive for congestion and sinus pressure ( pain d/t Dymista?). Negative for dental problem, ear pain, nosebleeds, postnasal drip, rhinorrhea, sneezing, sore throat and trouble swallowing.        Nasal dryness  Eyes: Negative for redness and itching.  Respiratory: Negative for cough, chest tightness, shortness of breath and wheezing.   Cardiovascular: Negative for palpitations and leg swelling.  Gastrointestinal: Negative for nausea and vomiting.  Genitourinary: Negative for dysuria.  Musculoskeletal: Negative for joint swelling.  Skin: Negative for rash.  Neurological: Negative for headaches.  Hematological: Does not bruise/bleed easily.  Psychiatric/Behavioral: Negative for dysphoric mood. The patient is not nervous/anxious.        Objective:   Physical Exam Overweight male in no acute distress Nose without purulence or discharge noted No skin breakdown or pressure necrosis from the CPAP mask Neck without lymphadenopathy or thyromegaly Lower extremities without edema, no cyanosis Alert and oriented, does not appear to be sleepy, moves all 4 extremities.       Assessment & Plan:

## 2013-05-06 NOTE — Patient Instructions (Signed)
Will send an order to your home care company to set your machine on auto 5-20cm.  Let us know if issues. Stop dymista and try astelin nasal spray, 1-2 each nostril one or two times a day.  Can titrate based on response and dryness.  Work on weight loss followup with me again in 7mos.

## 2013-05-11 ENCOUNTER — Other Ambulatory Visit: Payer: Self-pay | Admitting: Internal Medicine

## 2013-05-12 NOTE — Telephone Encounter (Signed)
[facility] 

## 2013-07-18 ENCOUNTER — Telehealth: Payer: Self-pay | Admitting: Pulmonary Disease

## 2013-07-18 DIAGNOSIS — G4733 Obstructive sleep apnea (adult) (pediatric): Secondary | ICD-10-CM

## 2013-07-21 ENCOUNTER — Other Ambulatory Visit: Payer: Self-pay | Admitting: Orthopaedic Surgery

## 2013-07-21 DIAGNOSIS — M25561 Pain in right knee: Secondary | ICD-10-CM

## 2013-07-21 NOTE — Telephone Encounter (Signed)
I spoke with the pt and he states when he first puts his mask on he does not feel like he is getting enough air. He states he will take the mask off so it will ramp up, and then place it back on and this is much better. He states once hit ramps up he is much more comfortable and can fall asleep. Pt states machine is set on auto 5-20. He is wanting to know can the initial # be increased? Please advise. [place], CMA

## 2013-07-22 NOTE — Telephone Encounter (Signed)
Ok to change pressure range to 10-20 and see how he does.

## 2013-07-23 NOTE — Telephone Encounter (Signed)
Pt aware of recs and order placed. Nothing further needed

## 2013-07-30 ENCOUNTER — Ambulatory Visit
Admission: RE | Admit: 2013-07-30 | Discharge: 2013-07-30 | Disposition: A | Discharge status: Home / Self Care | Payer: BC Managed Care – PPO | Source: Ambulatory Visit | Attending: Orthopaedic Surgery | Admitting: Orthopaedic Surgery

## 2013-07-30 ENCOUNTER — Other Ambulatory Visit: Payer: Self-pay | Admitting: Orthopaedic Surgery

## 2013-07-30 DIAGNOSIS — M25562 Pain in left knee: Secondary | ICD-10-CM

## 2013-07-30 DIAGNOSIS — M25561 Pain in right knee: Secondary | ICD-10-CM

## 2013-08-14 ENCOUNTER — Other Ambulatory Visit: Payer: Self-pay | Admitting: Internal Medicine

## 2013-08-15 NOTE — Telephone Encounter (Signed)
Done hardcopy to robin  

## 2013-08-15 NOTE — Telephone Encounter (Signed)
MD out of office. Pls advise on refill.../lmb 

## 2013-08-15 NOTE — Telephone Encounter (Signed)
[facility]  

## 2013-08-25 ENCOUNTER — Ambulatory Visit (INDEPENDENT_AMBULATORY_CARE_PROVIDER_SITE_OTHER): Payer: BC Managed Care – PPO | Admitting: General Surgery

## 2013-08-25 ENCOUNTER — Encounter (INDEPENDENT_AMBULATORY_CARE_PROVIDER_SITE_OTHER): Payer: Self-pay | Admitting: General Surgery

## 2013-08-25 VITALS — BP 120/80 | HR 64 | Temp 98.6°F | Resp 16 | Ht 70.0 in | Wt 307.2 lb

## 2013-08-25 DIAGNOSIS — K648 Other hemorrhoids: Secondary | ICD-10-CM

## 2013-08-25 NOTE — Progress Notes (Signed)
Chief Complaint  Patient presents with  . hemmrhoids    HISTORY: Jonathan Powers is a 63 y.o. male who presents to the office with anal bleeding and pain.  Other symptoms include pain for the past few weeks.  He was seen by Dr Oletta Lamas who performed a flex sig and found significantly inflamed hemorrhoids.  he has tried anusol and lidoderm in the past with limited success.  Skin contact makes the symptoms worse.   It is continuous in nature.  his bowel habits are irregular and his bowel movements are usually liquid to soft.  his fiber intake is minimal.  his last colonoscopy was 2 yrs ago and was normal.  He does endorse some prolapsing tissue.     Past Medical History  Diagnosis Date  . Hypertension   . OSTEOARTHRITIS   . OBESITY NOS   . HYPERTENSION   . CHOLECYSTECTOMY, LAPAROSCOPIC, HX OF   . CELLULITIS, LEG, RIGHT     Recurrent R hip cellulitis 1/08,3/08   . CARPAL TUNNEL RELEASE, BILATERAL, HX OF   . ALLERGIC RHINITIS   . Migraines     Atypical and ocular  . Hypothyroid   . Diverticulitis   . GERD (gastroesophageal reflux disease)   . Gout   . Hypogonadism male     a/w ED  . Hashimoto's thyroiditis       Past Surgical History  Procedure Laterality Date  . Right shoulder cuff repair Right 09/15/11  . Right shoulder sad, dcr Right 02/05/11  . Total hip arthroplasty Left 07/26/06  . Total hip arthroplasty Right 1999  . Cholecystectomy  2004  . Repair digital thumb, left Left 1990  . Ctr Bilateral 1982  . Cystoscopy  1974  . Open reduction l little finger Left 1970        Current Outpatient Prescriptions  Medication Sig Dispense Refill  . acetaminophen (TYLENOL) 500 MG tablet Take 1,000 mg by mouth 2 (two) times daily.      . ANDROGEL PUMP 20.25 MG/ACT (1.62%) GEL PLACE 3 TO 4 PUMPS ONTO THE SKIN DAILY  150 g  0  . atorvastatin (LIPITOR) 10 MG tablet Take 5 mg by mouth every other day.      . cetirizine (ZYRTEC) 10 MG tablet Take 10 mg by mouth daily.      . diclofenac  sodium (VOLTAREN) 1 % GEL Apply 2 g topically 4 (four) times daily as needed (pain).       . Esomeprazole Magnesium (NEXIUM 24HR PO) Take by mouth daily.      . febuxostat (ULORIC) 40 MG tablet Take 40 mg by mouth daily.       . hydrochlorothiazide (HYDRODIURIL) 12.5 MG tablet Take 12.5 mg by mouth daily.      [name] levothyroxine (SYNTHROID, LEVOTHROID) 175 MCG tablet Take 175 mcg by mouth daily before breakfast.      . valsartan (DIOVAN) 160 MG tablet Take 160 mg by mouth daily.      [name] VIAGRA 100 MG tablet TAKE 0.5-1 TABLETS (50-100 MG TOTAL) BY MOUTH DAILY AS NEEDED FOR ERECTILE DYSFUNCTION.  10 tablet  0   No current facility-administered medications for this visit.      Allergies  Allergen Reactions  . Nsaids Shortness Of Breath    Naproxen specifically causes SOB/wheeze tolerates topical diclofenac without problem  . Penicillins Rash      Family History  Problem Relation Age of Onset  . Arthritis Mother   . Diabetes Mother   .  Arthritis Father   . Heart disease Father   . Diabetes Father   . Cancer Paternal Grandmother   . Cancer Paternal Grandfather     stomach cancer  . Multiple sclerosis Mother     History   Social History  . Marital Status: Married    Spouse Name: N/A    Number of Children: N/A  . Years of Education: N/A   Occupational History  . Orthopedic PA    Social History Main Topics  . Smoking status: Never Smoker   . Smokeless tobacco: None     Comment: ortho PA, married, lives with spouse  . Alcohol Use: Yes     Comment: rare  . Drug Use: No  . Sexual Activity: None   Other Topics Concern  . None   Social History Narrative  . None      REVIEW OF SYSTEMS - PERTINENT POSITIVES ONLY: Review of Systems - General ROS: negative for - chills, fever or weight loss Hematological and Lymphatic ROS: negative for - bleeding problems, blood clots or bruising Respiratory ROS: no cough, shortness of breath, or wheezing Cardiovascular ROS: no chest pain or  dyspnea on exertion Gastrointestinal ROS: positive for - blood in stools negative for - nausea/vomiting Genito-Urinary ROS: no dysuria, trouble voiding, or hematuria  EXAM: Filed Vitals:   08/25/13 0917  BP: 120/80  Pulse: 64  Temp: 98.6 F (37 C)  Resp: 16    General appearance: alert and cooperative Resp: clear to auscultation bilaterally Cardio: regular rate and rhythm GI: soft, non-tender; bowel sounds normal; no masses,  no organomegaly  Procedure: Anoscopy and banding Surgeon: Marcello Moores Diagnosis: rectal bleeding and pain  Assistant: Staten After the risks and benefits were explained, verbal consent was obtained for above procedure  Anesthesia: none Findings: Grade 3 R ant (inflamed and bleeding), Grade 2 L lat, Grade 2 R post internal hemorrhoids, small external punctate lesions  The anatomy & physiology of the anorectal region was discussed.  The pathophysiology of hemorrhoids and differential diagnosis was discussed.  Natural history progression  was discussed.   I stressed the importance of a bowel regimen to have daily soft bowel movements to minimize progression of disease.     The patient's symptoms are not adequately controlled.  Therefore, I recommended banding to treat the hemorrhoids.  I went over the technique, risks, benefits, and alternatives.   Goals of post-operative recovery were discussed as well.  Questions were answered.  The patient expressed understanding & wished to proceed.  The patient was positioned in the prone position on the proctology table.  Perianal & rectal examination was done.  Using anoscopy, I ligated the L lat and R ant hemorrhoids above the dentate line with banding.  The patient tolerated the procedure well.  Educational handouts further explaining the pathology, treatment options, and bowel regimen were given as well.   ASSESSMENT AND PLAN: Jonathan Powers is a 63 y.o. male with rectal bleeding and anal pain who presents to the office  for evaluation.  We discussed treatment options.  He has decided to try banding.  He understands that this may take several office procedures to conclude, but he would like to avoid a major surgery if possible.  I placed 2 bands today.  He will f/u with me in 3-4 weeks.     Rosario Adie, MD Colon and Rectal Surgery / [facility]      Visit Diagnoses: No diagnosis found.  Primary Care  Physician: Gwendolyn Grant, MD

## 2013-08-25 NOTE — Patient Instructions (Signed)
Patient Information following hemorrhoid banding  Hemorrhoid banding is a procedure that places a small rubber band around a hemorrhoid, causing it to clot and then break off and be passed in the stool.  This is a minor procedure, but problems may develop in rare cases.  The following are warning symptoms and signs that should alert you to a possible complication.  Please contact the office if any of these should occur:   Increased pain with bowel movements or sitting  Temperature over 100.4 F (oral)  Bleeding that is excessive (over one cup of clots or blood)  Redness or irritation outside the anus  Difficulty urinating  For your comfort please follow these instructions:   Maintain a high fiber diet so that your bowel movements will be soft  Take a fiber supplement twice a day (such as Metamucil, Benefiber, Citracel or Fibercon)  Sit in a tub of warm water 2-3 times a day for the first 2-3 days, as needed to soothe the area.  You may expect some pressure sensations in the anal area for 1-2 days  If you need pain medication, take Tylenol not aspirin or Ibuprofen products.  In 7-10 days, the banded tissue and rubber band will pass with your stool.  Occasionally there is some bleeding after this.  If this bleeding seems excessive, call the office immediately or go to the Emergency Room.    Make an appointment to see me in 2-3 weeks after the procedure 

## 2013-08-27 ENCOUNTER — Encounter (INDEPENDENT_AMBULATORY_CARE_PROVIDER_SITE_OTHER): Payer: Self-pay

## 2013-09-15 ENCOUNTER — Encounter (INDEPENDENT_AMBULATORY_CARE_PROVIDER_SITE_OTHER): Payer: Self-pay | Admitting: General Surgery

## 2013-09-15 ENCOUNTER — Ambulatory Visit (INDEPENDENT_AMBULATORY_CARE_PROVIDER_SITE_OTHER): Payer: BC Managed Care – PPO | Admitting: General Surgery

## 2013-09-15 VITALS — BP 124/76 | HR 68 | Temp 97.6°F | Ht 71.0 in | Wt 296.0 lb

## 2013-09-15 DIAGNOSIS — K648 Other hemorrhoids: Secondary | ICD-10-CM

## 2013-09-15 MED ORDER — HYDROCORTISONE ACETATE 25 MG RE SUPP: 25.0000 mg | Freq: Two times a day (BID) | RECTAL | Status: DC | PRN

## 2013-09-15 NOTE — Progress Notes (Signed)
Jonathan Powers is a 63 y.o. male who is here for a follow up visit regarding his bleeding internal hemorrhoids.  His pain and bleeding are much better after banding.  He is still having loose stools.    Objective: Filed Vitals:   09/15/13 1543  BP: 124/76  Pulse: 68  Temp: 97.6 F (36.4 C)    General appearance: alert and cooperative GI: normal findings: soft, non-tender Anal findings: large grade 2 internal hemorrhoid, L lateral hemorrhoid with inflammation but not bleeding.    Assessment and Plan: Discussed options with patient.  He is going to manage with dietary and lifestyle modifications for now.  He will call the office if he develops new bleeding.  Rx given for prn anusol suppositories.      Rosario Adie, MD [facility] [place]

## 2013-09-15 NOTE — Patient Instructions (Signed)
GETTING TO GOOD BOWEL HEALTH. Irregular bowel habits such as diarrhea and multiple loose stools throughout the day can lead to many problems over time.  Having one soft, formed bowel movement a day is the most important way to prevent further problems.  The anorectal canal is designed to handle stretching and feces to safely manage our ability to get rid of solid waste (feces, poop, stool) out of our body.  BUT, diarrhea can be a burning fire to this very sensitive area of our body, causing inflamed hemorrhoids, anal fissures, increasing risk is perirectal abscesses, abdominal pain and bloating.      The goal: ONE SOFT BOWEL MOVEMENT A DAY!  To have soft, regular bowel movements:    Drink at least 8 tall glasses of water a day.     Take plenty of fiber.  Fiber is marketed as a cure for constipation, but in certain forms, it can help with loose stools as well.  Fiber is the undigested part of plant food that passes into the colon, acting as "natures broom" to encourage bowel motility and movement.  Fiber can absorb and hold large amounts of water. This results in a larger, bulkier stool, which is soft and easier to pass.    Work gradually over several weeks up to 6 servings a day of fiber (25g a day even more if needed) in the form of: o Vegetables -- Root (potatoes, carrots, turnips), leafy green (lettuce, salad greens, celery, spinach), or cooked high residue (cabbage, broccoli, etc) o Fruit -- Fresh (unpeeled skin & pulp), Dried (prunes, apricots, cherries, etc ),  or stewed ( applesauce)  o Whole grain breads, pasta, etc (whole wheat)  o Bran cereals    Bulking Agents -- This type of water-retaining fiber generally is easily tolerated and can help be a consistent source of fiber for irregular bowel habits.  Fibers that are recommended for this are:  o Methylcellulose --This is a fiber derived from wood which also retains water. It is available as Citrucel.  o FiberCon (Polycarbiphil) is another good  source of fiber for this that can help bulk your stools.    Controlling diarrhea o Switch to liquids and simpler foods for a few days to avoid stressing your intestines further. o Avoid dairy products (especially milk & ice cream) for a short time.  The intestines often can lose the ability to digest lactose when stressed. o Avoid foods that cause gassiness or bloating.  Typical foods include beans and other legumes, cabbage, broccoli, and dairy foods.  Every person has some sensitivity to other foods, so listen to our body and avoid those foods that trigger problems for you. o Adding fiber (Citrucel, Metamucil, psyllium, Miralax) gradually can help thicken stools by absorbing excess fluid and retrain the intestines to act more normally.  Slowly increase the dose over a few weeks.  Too much fiber too soon can backfire and cause cramping & bloating. o Probiotics (such as active yogurt, Align, etc) may help repopulate the intestines and colon with normal bacteria and calm down a sensitive digestive tract.  Most studies show it to be of mild help, though, and such products can be costly. o Medicines:   Bismuth subsalicylate (ex. Kayopectate, Pepto Bismol) every 30 minutes for up to 6 doses can help control diarrhea.  Avoid if pregnant.   Loperamide (Immodium) can slow down diarrhea.  Start with two tablets (4mg  total) first and then try one tablet every 6 hours.  Avoid if you  are having fevers or severe pain.  If you are not better or start feeling worse, stop all medicines and call your doctor for advice o Call your doctor if you are getting worse or not better.  Sometimes further testing (cultures, endoscopy, X-ray studies, bloodwork, etc) may be needed to help diagnose and treat the cause of the diarrhea. o

## 2013-09-16 ENCOUNTER — Ambulatory Visit (INDEPENDENT_AMBULATORY_CARE_PROVIDER_SITE_OTHER): Payer: BC Managed Care – PPO | Admitting: Surgery

## 2013-09-19 ENCOUNTER — Other Ambulatory Visit: Payer: Self-pay | Admitting: Internal Medicine

## 2013-09-22 ENCOUNTER — Other Ambulatory Visit: Payer: Self-pay

## 2013-09-22 LAB — HM SIGMOIDOSCOPY

## 2013-09-22 NOTE — Telephone Encounter (Signed)
Ok to print refill (pended) and i will sign for pick up thanks

## 2013-09-22 NOTE — Telephone Encounter (Signed)
What is the question? 

## 2013-09-23 MED ORDER — TESTOSTERONE 20.25 MG/ACT (1.62%) TD GEL: TRANSDERMAL | Status: DC

## 2013-09-23 NOTE — Telephone Encounter (Signed)
LVM for pt to let me know the correct pharm to send the rx to.

## 2013-10-24 ENCOUNTER — Other Ambulatory Visit: Payer: Self-pay

## 2013-10-24 ENCOUNTER — Other Ambulatory Visit: Payer: Self-pay | Admitting: Internal Medicine

## 2013-10-24 MED ORDER — TESTOSTERONE 20.25 MG/ACT (1.62%) TD GEL: TRANSDERMAL | Status: DC

## 2013-11-05 ENCOUNTER — Ambulatory Visit: Payer: BC Managed Care – PPO | Admitting: Internal Medicine

## 2013-11-11 LAB — LIPID PANEL
Cholesterol: 119 mg/dL (ref 0–200)
HDL: 51 mg/dL (ref 35–70)
LDL Cholesterol: 50 mg/dL
Triglycerides: 90 mg/dL (ref 40–160)

## 2013-11-11 LAB — HEPATIC FUNCTION PANEL
ALK PHOS: 51 U/L (ref 25–125)
ALT: 40 U/L (ref 10–40)
AST: 27 U/L (ref 14–40)
Bilirubin, Total: 0.9 mg/dL

## 2013-11-11 LAB — CBC AND DIFFERENTIAL
Hemoglobin: 16.1 g/dL (ref 13.5–17.5)
Platelets: 277 10*3/uL (ref 150–399)
WBC: 7.3 10*3/mL

## 2013-11-11 LAB — BASIC METABOLIC PANEL
BUN: 12 mg/dL (ref 4–21)
CREATININE: 0.9 mg/dL (ref 0.6–1.3)
GLUCOSE: 92 mg/dL
Potassium: 4.5 mmol/L (ref 3.4–5.3)
Sodium: 140 mmol/L (ref 137–147)

## 2013-11-11 LAB — TSH: TSH: 0.67 u[IU]/mL (ref 0.41–5.90)

## 2013-11-11 LAB — HEMOGLOBIN A1C: HEMOGLOBIN A1C: 5.9 % (ref 4.0–6.0)

## 2013-12-10 ENCOUNTER — Ambulatory Visit (INDEPENDENT_AMBULATORY_CARE_PROVIDER_SITE_OTHER): Payer: BC Managed Care – PPO | Admitting: Pulmonary Disease

## 2013-12-10 ENCOUNTER — Encounter: Payer: Self-pay | Admitting: Pulmonary Disease

## 2013-12-10 VITALS — BP 116/58 | HR 61 | Temp 98.4°F | Ht 71.0 in | Wt 307.4 lb

## 2013-12-10 DIAGNOSIS — G4733 Obstructive sleep apnea (adult) (pediatric): Secondary | ICD-10-CM

## 2013-12-10 NOTE — Assessment & Plan Note (Signed)
The patient is doing very well on CPAP by his download, and does feel like he is sleeping better. He is having no issues with his mask fit, although he does have increased leak on his download.  He will continue to work on this. I have asked him to stay on CPAP, keep up with mask changes and supplies, and to work on weight loss.

## 2013-12-10 NOTE — Progress Notes (Signed)
   Subjective:    Patient ID: Jonathan Powers, male    DOB: 10-03-1950, 63 y.o.   MRN: [id number]  HPI The patient comes in today for followup of his obstructive sleep apnea. He is wearing CPAP compliantly by his download, and has excellent control of his AHI. He is not having breakthrough snoring or apneas according to his wife, and does feel that he is sleeping better. He is having some mask leak by his download, but he does not believe this is overly significant.   Review of Systems  Constitutional: Negative for fever and unexpected weight change.  HENT: Negative for congestion, dental problem, ear pain, nosebleeds, postnasal drip, rhinorrhea, sinus pressure, sneezing, sore throat and trouble swallowing.   Eyes: Negative for redness and itching.  Respiratory: Negative for cough, chest tightness, shortness of breath and wheezing.   Cardiovascular: Negative for palpitations and leg swelling.  Gastrointestinal: Negative for nausea and vomiting.  Genitourinary: Negative for dysuria.  Musculoskeletal: Negative for joint swelling.  Skin: Negative for rash.  Neurological: Negative for headaches.  Hematological: Does not bruise/bleed easily.  Psychiatric/Behavioral: Negative for dysphoric mood. The patient is not nervous/anxious.        Objective:   Physical Exam Overweight male in no acute distress Nose without purulence or discharge noted Neck without lymphadenopathy or thyromegaly No skin breakdown or pressure necrosis from the CPAP mask Lower extremities with minimal edema, no cyanosis Alert and oriented, does not appear to be sleepy, 4 extremities.       Assessment & Plan:

## 2013-12-10 NOTE — Patient Instructions (Signed)
Continue with cpap, and keep up with mask changes and supplies. Work on weight loss followup with me again in one year.  

## 2013-12-22 ENCOUNTER — Other Ambulatory Visit: Payer: Self-pay | Admitting: Internal Medicine

## 2013-12-24 ENCOUNTER — Other Ambulatory Visit: Payer: Self-pay

## 2013-12-24 MED ORDER — LEVOTHYROXINE SODIUM 175 MCG PO TABS: 175.0000 ug | ORAL_TABLET | Freq: Every day | ORAL | Status: DC

## 2014-01-12 ENCOUNTER — Ambulatory Visit (INDEPENDENT_AMBULATORY_CARE_PROVIDER_SITE_OTHER): Payer: BC Managed Care – PPO | Admitting: Internal Medicine

## 2014-01-12 ENCOUNTER — Encounter: Payer: Self-pay | Admitting: Internal Medicine

## 2014-01-12 VITALS — BP 120/78 | HR 59 | Temp 98.2°F | Ht 71.0 in | Wt 300.3 lb

## 2014-01-12 DIAGNOSIS — Z0000 Encounter for general adult medical examination without abnormal findings: Secondary | ICD-10-CM

## 2014-01-12 DIAGNOSIS — J3489 Other specified disorders of nose and nasal sinuses: Secondary | ICD-10-CM

## 2014-01-12 MED ORDER — ATORVASTATIN CALCIUM 10 MG PO TABS: 10.0000 mg | ORAL_TABLET | Freq: Every day | ORAL | Status: DC

## 2014-01-12 MED ORDER — HYDROCHLOROTHIAZIDE 12.5 MG PO TABS: 12.5000 mg | ORAL_TABLET | Freq: Every day | ORAL | Status: DC

## 2014-01-12 MED ORDER — TESTOSTERONE 20.25 MG/ACT (1.62%) TD GEL: 4.0000 | Freq: Every day | TRANSDERMAL | Status: DC

## 2014-01-12 MED ORDER — SILDENAFIL CITRATE 25 MG PO TABS: 50.0000 mg | ORAL_TABLET | ORAL | Status: DC | PRN

## 2014-01-12 NOTE — Patient Instructions (Addendum)
It was good to see you today.  We have reviewed your prior records including labs and tests today  Health Maintenance reviewed - all recommended immunizations and age-appropriate screenings are up-to-date.  Medications reviewed and updated, no changes recommended at this time. Refill on medication(s) as discussed today.  we'll make referral for CT sinus. Our office will contact you regarding appointment(s) once made.  Please schedule followup in 12 months for annual exam and labs, call sooner if problems.  Health Maintenance A healthy lifestyle and preventative care can promote health and wellness.  Maintain regular health, dental, and eye exams.  Eat a healthy diet. Foods like vegetables, fruits, whole grains, low-fat dairy products, and lean protein foods contain the nutrients you need and are low in calories. Decrease your intake of foods high in solid fats, added sugars, and salt. Get information about a proper diet from your health care provider, if necessary.  Regular physical exercise is one of the most important things you can do for your health. Most adults should get at least 150 minutes of moderate-intensity exercise (any activity that increases your heart rate and causes you to sweat) each week. In addition, most adults need muscle-strengthening exercises on 2 or more days a week.   Maintain a healthy weight. The body mass index (BMI) is a screening tool to identify possible weight problems. It provides an estimate of body fat based on height and weight. Your health care provider can find your BMI and can help you achieve or maintain a healthy weight. For males 20 years and older:  A BMI below 18.5 is considered underweight.  A BMI of 18.5 to 24.9 is normal.  A BMI of 25 to 29.9 is considered overweight.  A BMI of 30 and above is considered obese.  Maintain normal blood lipids and cholesterol by exercising and minimizing your intake of saturated fat. Eat a balanced diet  with plenty of fruits and vegetables. Blood tests for lipids and cholesterol should begin at age 13 and be repeated every 5 years. If your lipid or cholesterol levels are high, you are over age 61, or you are at high risk for heart disease, you may need your cholesterol levels checked more frequently.Ongoing high lipid and cholesterol levels should be treated with medicines if diet and exercise are not working.  If you smoke, find out from your health care provider how to quit. If you do not use tobacco, do not start.  Lung cancer screening is recommended for adults aged 56-80 years who are at high risk for developing lung cancer because of a history of smoking. A yearly low-dose CT scan of the lungs is recommended for people who have at least a 30-pack-year history of smoking and are current smokers or have quit within the past 15 years. A pack year of smoking is smoking an average of 1 pack of cigarettes a day for 1 year (for example, a 30-pack-year history of smoking could mean smoking 1 pack a day for 30 years or 2 packs a day for 15 years). Yearly screening should continue until the smoker has stopped smoking for at least 15 years. Yearly screening should be stopped for people who develop a health problem that would prevent them from having lung cancer treatment.  If you choose to drink alcohol, do not have more than 2 drinks per day. One drink is considered to be 12 oz (360 mL) of beer, 5 oz (150 mL) of wine, or 1.5 oz (45 mL) of  liquor.  Avoid the use of street drugs. Do not share needles with anyone. Ask for help if you need support or instructions about stopping the use of drugs.  High blood pressure causes heart disease and increases the risk of stroke. Blood pressure should be checked at least every 1-2 years. Ongoing high blood pressure should be treated with medicines if weight loss and exercise are not effective.  If you are 6-87 years old, ask your health care provider if you should take  aspirin to prevent heart disease.  Diabetes screening involves taking a blood sample to check your fasting blood sugar level. This should be done once every 3 years after age 73 if you are at a normal weight and without risk factors for diabetes. Testing should be considered at a younger age or be carried out more frequently if you are overweight and have at least 1 risk factor for diabetes.  Colorectal cancer can be detected and often prevented. Most routine colorectal cancer screening begins at the age of 4 and continues through age 98. However, your health care provider may recommend screening at an earlier age if you have risk factors for colon cancer. On a yearly basis, your health care provider may provide home test kits to check for hidden blood in the stool. A small camera at the end of a tube may be used to directly examine the colon (sigmoidoscopy or colonoscopy) to detect the earliest forms of colorectal cancer. Talk to your health care provider about this at age 44 when routine screening begins. A direct exam of the colon should be repeated every 5-10 years through age 43, unless early forms of precancerous polyps or small growths are found.  People who are at an increased risk for hepatitis B should be screened for this virus. You are considered at high risk for hepatitis B if:  You were born in a country where hepatitis B occurs often. Talk with your health care provider about which countries are considered high risk.  Your parents were born in a high-risk country and you have not received a shot to protect against hepatitis B (hepatitis B vaccine).  You have HIV or AIDS.  You use needles to inject street drugs.  You live with, or have sex with, someone who has hepatitis B.  You are a man who has sex with other men (MSM).  You get hemodialysis treatment.  You take certain medicines for conditions like cancer, organ transplantation, and autoimmune conditions.  Hepatitis C blood  testing is recommended for all people born from [birth year] through 1964 and any individual with known risk factors for hepatitis C.  Healthy men should no longer receive prostate-specific antigen (PSA) blood tests as part of routine cancer screening. Talk to your health care provider about prostate cancer screening.  Testicular cancer screening is not recommended for adolescents or adult males who have no symptoms. Screening includes self-exam, a health care provider exam, and other screening tests. Consult with your health care provider about any symptoms you have or any concerns you have about testicular cancer.  Practice safe sex. Use condoms and avoid high-risk sexual practices to reduce the spread of sexually transmitted infections (STIs).  You should be screened for STIs, including gonorrhea and chlamydia if:  You are sexually active and are younger than 24 years.  You are older than 24 years, and your health care provider tells you that you are at risk for this type of infection.  Your sexual activity has changed  since you were last screened, and you are at an increased risk for chlamydia or gonorrhea. Ask your health care provider if you are at risk.  If you are at risk of being infected with HIV, it is recommended that you take a prescription medicine daily to prevent HIV infection. This is called pre-exposure prophylaxis (PrEP). You are considered at risk if:  You are a man who has sex with other men (MSM).  You are a heterosexual man who is sexually active with multiple partners.  You take drugs by injection.  You are sexually active with a partner who has HIV.  Talk with your health care provider about whether you are at high risk of being infected with HIV. If you choose to begin PrEP, you should first be tested for HIV. You should then be tested every 3 months for as long as you are taking PrEP.  Use sunscreen. Apply sunscreen liberally and repeatedly throughout the day. You  should seek shade when your shadow is shorter than you. Protect yourself by wearing long sleeves, pants, a wide-brimmed hat, and sunglasses year round whenever you are outdoors.  Tell your health care provider of new moles or changes in moles, especially if there is a change in shape or color. Also, tell your health care provider if a mole is larger than the size of a pencil eraser.  A one-time screening for abdominal aortic aneurysm (AAA) and surgical repair of large AAAs by ultrasound is recommended for men aged 62-75 years who are current or former smokers.  Stay current with your vaccines (immunizations). Document Released: 08/05/2007 Document Revised: 02/11/2013 Document Reviewed: 07/04/2010 [facility] Patient Information 2015 [place], [place]. This information is not intended to replace advice given to you by your health care provider. Make sure you discuss any questions you have with your health care provider.

## 2014-01-12 NOTE — Progress Notes (Signed)
Pre visit review using our clinic review tool, if applicable. No additional management support is needed unless otherwise documented below in the visit note. 

## 2014-01-12 NOTE — Progress Notes (Signed)
Subjective:    Patient ID: Jonathan Powers, male    DOB: October 09, 1950, 63 y.o.   MRN: [id number]  HPI  patient is here today for annual physical. Patient feels well and has no complaints.  Also reviewed chronic medical issues and interval medical events  Past Medical History  Diagnosis Date  . Hypertension   . OSTEOARTHRITIS   . OBESITY NOS   . HYPERTENSION   . CHOLECYSTECTOMY, LAPAROSCOPIC, HX OF   . CELLULITIS, LEG, RIGHT     Recurrent R hip cellulitis 1/08,3/08   . CARPAL TUNNEL RELEASE, BILATERAL, HX OF   . ALLERGIC RHINITIS   . Migraines     Atypical and ocular  . Hypothyroid   . Diverticulitis   . GERD (gastroesophageal reflux disease)   . Gout   . Hypogonadism male     a/w ED  . Hashimoto's thyroiditis    Family History  Problem Relation Age of Onset  . Arthritis Mother   . Diabetes Mother   . Arthritis Father   . Heart disease Father   . Diabetes Father   . Cancer Paternal Grandmother   . Cancer Paternal Grandfather     stomach cancer  . Multiple sclerosis Mother    History  Substance Use Topics  . Smoking status: Never Smoker   . Smokeless tobacco: Not on file     Comment: ortho PA, married, lives with spouse  . Alcohol Use: Yes     Comment: rare     Review of Systems  Constitutional: Negative for fever, activity change, appetite change, fatigue and unexpected weight change.  HENT: Positive for ear pain (R x 1 week) and sinus pressure (R>L).   Respiratory: Negative for cough, chest tightness, shortness of breath and wheezing.   Cardiovascular: Negative for chest pain, palpitations and leg swelling.  Neurological: Negative for dizziness, weakness and headaches.  Psychiatric/Behavioral: Negative for dysphoric mood. The patient is not nervous/anxious.   All other systems reviewed and are negative.      Objective:   Physical Exam  BP 120/78 mmHg  Pulse 59  Temp(Src) 98.2 F (36.8 C) (Oral)  Ht 5\' 11"  (1.803 m)  Wt 300 lb 5 oz (136.221 kg)   BMI 41.90 kg/m2  SpO2 96% Wt Readings from Last 3 Encounters:  01/12/14 300 lb 5 oz (136.221 kg)  12/10/13 307 lb 6.4 oz (139.436 kg)  09/15/13 296 lb (134.265 kg)   Constitutional: he is obese; appears well-developed and well-nourished. No distress.  HENT: Head: Normocephalic and atraumatic. Right greater than left sinus pressure frontal and maxillary to palpation. Ears: R TM HAZY -LTM ok, no erythema or effusion; Nose: Clear rhinorrhea, no turbinate swelling. Mouth/Throat: Oropharynx is clear and moist. No oropharyngeal exudate.  Eyes: Conjunctivae and EOM are normal. Pupils are equal, round, and reactive to light. No scleral icterus.  Neck: Normal range of motion. Neck supple. No JVD present. No thyromegaly present.  Cardiovascular: Normal rate, regular rhythm and normal heart sounds.  No murmur heard. No BLE edema. Pulmonary/Chest: Effort normal and breath sounds normal. No respiratory distress. he has no wheezes.  Abdominal: Soft. Bowel sounds are normal. he exhibits no distension. There is no tenderness. no masses GU: defer Musculoskeletal: Normal range of motion, no joint effusions. No gross deformities Neurological: he is alert and oriented to person, place, and time. No cranial nerve deficit. Coordination, balance, strength, speech and gait are normal.  Skin: Skin is warm and dry. No rash noted. No erythema.  Psychiatric:  he has a normal mood and affect. behavior is normal. Judgment and thought content normal.   Lab Results  Component Value Date   WBC 11.1* 08/10/2012   HGB 17.1* 08/10/2012   HCT 47.5 08/10/2012   PLT 244 08/10/2012   GLUCOSE 110* 08/10/2012   CHOL 182 02/23/2012   TRIG 176* 02/23/2012   HDL 40 02/23/2012   LDLCALC 107 02/23/2012   ALT 36 02/23/2012   AST 26 02/23/2012   NA 138 08/10/2012   K 3.6 08/10/2012   CL 97 08/10/2012   CREATININE 0.99 08/10/2012   BUN 11 08/10/2012   CO2 30 08/10/2012   TSH 1.33 02/23/2012   INR 2.4* 07/29/2006   HGBA1C  5.8 02/23/2012    Mr Knee Left  Wo Contrast  07/31/2013   CLINICAL DATA:  Right knee pain.  EXAM: MRI OF THE LEFT KNEE WITHOUT CONTRAST  TECHNIQUE: Multiplanar, multisequence MR imaging of the knee was performed. No intravenous contrast was administered.  COMPARISON:  None.  FINDINGS: MENISCI  Medial meniscus: There is complex tearing of the undersurface of the posterior horn of the medial meniscus. The body of medial meniscus is degenerative and diminutive without focal tear seen.  Lateral meniscus: There is an oblique tear in the posterior horn of the lateral meniscus reaching the meniscal undersurface. The body of the lateral meniscus is severely degenerated with complex tearing throughout. Virtually no normal-appearing meniscal tissue in the body is identified. The anterior horn is degenerated without focal tear.  LIGAMENTS  Cruciates:  Intact.  Collaterals:  Intact.  CARTILAGE  Patellofemoral:  Mildly degenerated.  Medial:  Appears mildly degenerated.  Lateral: Extensive hyaline cartilage loss is present with associated joint space narrowing.  Joint:  Small to moderate joint effusion is present.  Popliteal Fossa: Large Baker's cyst measures 2.5 cm transverse by 3.9 cm AP by 7.0 cm craniocaudal.  Extensor Mechanism: Intrasubstance increased T2 signal is seen in the central fibers of the superior patellar tendon consistent with tendinosis. The extensor mechanism is intact.  Bones: Mild subchondral edema and small osteophytes are seen about the lateral compartment.  IMPRESSION: Extensive degenerative tearing posterior horn and body of the lateral meniscus. Undersurface complex tearing posterior horn of the medial meniscus is also identified.  Large Baker's cyst.  Osteoarthritis appearing worst in the lateral compartment.   Electronically Signed   By: Inge Rise M.D.   On: 07/31/2013 09:29       Assessment & Plan:   CPX/z00.00 - Patient has been counseled on age-appropriate routine health concerns  for screening and prevention. These are reviewed and up-to-date. Immunizations are up-to-date or declined. Labs ordered and reviewed.  Right greater than left sinus pain and pressure, associated with right-sided ear pain. Mildly hazy tympanic membrane on right but no erythema or obvious evidence of infection. Chronic allergies and sinusitis reviewed. Check CT sinuses. Continue symptomatically treatment of antihistamine, as needed decongestant and nasal steroid. No indication for an antibiotic at present. Further treatment to depend on results of imaging.

## 2014-01-26 ENCOUNTER — Telehealth: Payer: Self-pay | Admitting: Internal Medicine

## 2014-01-26 DIAGNOSIS — H539 Unspecified visual disturbance: Secondary | ICD-10-CM

## 2014-01-26 DIAGNOSIS — G4452 New daily persistent headache (NDPH): Secondary | ICD-10-CM

## 2014-01-26 DIAGNOSIS — R42 Dizziness and giddiness: Secondary | ICD-10-CM

## 2014-01-26 NOTE — Telephone Encounter (Signed)
Pt is requesting MRI/Head instead of CT/Sinus. Pt has visual changes and numbness ears. Pls let pt know [phone]

## 2014-01-27 NOTE — Telephone Encounter (Signed)
MRI ordered, CT cancelled Please let pt know same

## 2014-01-27 NOTE — Telephone Encounter (Signed)
LVM for pt to call back.

## 2014-02-01 ENCOUNTER — Encounter: Payer: Self-pay | Admitting: Internal Medicine

## 2014-02-06 ENCOUNTER — Ambulatory Visit
Admission: RE | Admit: 2014-02-06 | Discharge: 2014-02-06 | Disposition: A | Discharge status: Home / Self Care | Payer: BC Managed Care – PPO | Source: Ambulatory Visit | Attending: Internal Medicine | Admitting: Internal Medicine

## 2014-02-06 DIAGNOSIS — G4452 New daily persistent headache (NDPH): Secondary | ICD-10-CM

## 2014-02-06 DIAGNOSIS — R42 Dizziness and giddiness: Secondary | ICD-10-CM

## 2014-02-06 DIAGNOSIS — H539 Unspecified visual disturbance: Secondary | ICD-10-CM

## 2014-02-08 ENCOUNTER — Encounter: Payer: Self-pay | Admitting: Internal Medicine

## 2014-02-09 MED ORDER — SILDENAFIL CITRATE 100 MG PO TABS: 50.0000 mg | ORAL_TABLET | ORAL | Status: DC | PRN

## 2014-02-09 NOTE — Telephone Encounter (Signed)
Please print pended rx for me to sign for pt pick up Thanks!!

## 2014-02-10 MED ORDER — TESTOSTERONE 20.25 MG/ACT (1.62%) TD GEL: 4.0000 | Freq: Every day | TRANSDERMAL | Status: DC

## 2014-02-10 NOTE — Telephone Encounter (Signed)
Will put rx up front in cabinet.

## 2014-02-10 NOTE — Telephone Encounter (Signed)
LVM for pt to call back.   RE: rx is ready for pick up.

## 2014-03-10 ENCOUNTER — Telehealth: Payer: Self-pay | Admitting: Internal Medicine

## 2014-03-10 NOTE — Telephone Encounter (Signed)
Pt needs PA on androgel, switched to [facility] ins Jan1., [place].

## 2014-03-11 NOTE — Telephone Encounter (Signed)
PA started, waiting on determination.  Pt informed.

## 2014-03-16 NOTE — Telephone Encounter (Signed)
LVM for pt to call back.    RE: PA for Androgel was not approved

## 2014-03-16 NOTE — Telephone Encounter (Signed)
Pt called back in about the PA and would like to talk to you today about this.

## 2014-03-17 NOTE — Telephone Encounter (Signed)
Pt called back again.     Cell number [phone]  Please call this number

## 2014-03-19 NOTE — Telephone Encounter (Signed)
Spoke to pt and PA can be resent INSURANCE: [phone]  PA has be refaxed.

## 2014-03-26 NOTE — Telephone Encounter (Signed)
PA appeal was approved.

## 2014-03-30 ENCOUNTER — Telehealth: Payer: Self-pay | Admitting: Internal Medicine

## 2014-03-30 NOTE — Telephone Encounter (Signed)
Is requesting copy of PA denial on androgel

## 2014-03-30 NOTE — Telephone Encounter (Signed)
States you can email to [email or web address]

## 2014-03-31 NOTE — Telephone Encounter (Signed)
lvm for pt to call back.   Insurance company will have a copy of the denial letter. I do not keep those.

## 2014-05-11 ENCOUNTER — Telehealth: Payer: Self-pay | Admitting: Pulmonary Disease

## 2014-05-11 NOTE — Telephone Encounter (Signed)
[facility] pt of [facility]'s on CPAP last seen 10.21.15: Patient Instructions       Continue with cpap, and keep up with mask changes and supplies. Work on weight loss followup with me again in one year     Form located in top right cubby of Mindy's desk Pt is on [employer] - 90 day download printed and attached to the form  Will forward message to [facility]

## 2014-05-12 NOTE — Telephone Encounter (Signed)
This has been placed in [facility] green folder. Please advise thanks 

## 2014-05-13 NOTE — Telephone Encounter (Signed)
Spoke with pt. He is aware of his download results. 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 States he dropped this off on Monday.  Mindy do you have this form? Thanks!

## 2014-05-13 NOTE — Telephone Encounter (Signed)
The only other thing dropped off with download was an information request sheet. No form that needed to be filled out was left. thanks

## 2014-05-13 NOTE — Telephone Encounter (Signed)
Called and spoke to pt. Form stated the [facility] is needing proof of pt's control of sleep apnea and compliance. OV note, download, phone note, and pt's form was place in an envelope and placed up front for pick--pt aware. Copies of the download and the form have been given back to Mindy to be scanned. Nothing further needed.

## 2014-05-13 NOTE — Telephone Encounter (Signed)
Let pt know that his download looks good.  Great compliance, good control of his AHI, but he does have a little more mask leak than we usually like to see.  At least it is not affecting his control of his sleep apnea.  If it is bothering him, can look at different type of mask.

## 2014-06-03 ENCOUNTER — Other Ambulatory Visit: Payer: Self-pay | Admitting: Internal Medicine

## 2014-06-05 ENCOUNTER — Encounter: Payer: Self-pay | Admitting: Internal Medicine

## 2014-06-08 MED ORDER — SILDENAFIL CITRATE 20 MG PO TABS: 20.0000 mg | ORAL_TABLET | Freq: Every day | ORAL | Status: DC | PRN

## 2014-06-25 ENCOUNTER — Encounter: Payer: Self-pay | Admitting: Internal Medicine

## 2014-06-26 MED ORDER — TADALAFIL 5 MG PO TABS: 5.0000 mg | ORAL_TABLET | Freq: Every day | ORAL | Status: DC | PRN

## 2014-07-19 ENCOUNTER — Encounter: Payer: Self-pay | Admitting: Internal Medicine

## 2014-07-23 ENCOUNTER — Other Ambulatory Visit: Payer: Self-pay | Admitting: Internal Medicine

## 2014-07-24 LAB — PSA: PSA: 0.73

## 2014-08-04 NOTE — Telephone Encounter (Signed)
error 

## 2014-08-19 ENCOUNTER — Telehealth: Payer: Self-pay | Admitting: Internal Medicine

## 2014-08-19 ENCOUNTER — Encounter: Payer: Self-pay | Admitting: Internal Medicine

## 2014-08-19 NOTE — Telephone Encounter (Signed)
Patient is suffering insomnia and he has taken trasadone before years ago and it worked real well for him. He has tried [place], but with no luck. He is hoping you would prescribe trasadone

## 2014-08-20 MED ORDER — TRAZODONE HCL 50 MG PO TABS: 25.0000 mg | ORAL_TABLET | Freq: Every evening | ORAL | Status: DC | PRN

## 2014-08-20 NOTE — Telephone Encounter (Signed)
traz rx done to [facility] thanks

## 2014-09-15 ENCOUNTER — Other Ambulatory Visit: Payer: Self-pay

## 2014-09-15 MED ORDER — VALSARTAN 160 MG PO TABS: 160.0000 mg | ORAL_TABLET | Freq: Every day | ORAL | Status: DC

## 2014-09-29 LAB — HEPATIC FUNCTION PANEL
ALK PHOS: 53 U/L (ref 25–125)
ALT: 26 U/L (ref 10–40)
AST: 31 U/L (ref 14–40)
Bilirubin, Total: 0.8 mg/dL

## 2014-09-29 LAB — BASIC METABOLIC PANEL
BUN: 12 mg/dL (ref 4–21)
CREATININE: 0.9 mg/dL (ref 0.6–1.3)
Potassium: 4.3 mmol/L (ref 3.4–5.3)
Sodium: 141 mmol/L (ref 137–147)

## 2014-09-29 LAB — HEMOGLOBIN A1C: Hgb A1c MFr Bld: 5.6 % (ref 4.0–6.0)

## 2014-09-29 LAB — LIPID PANEL
CHOLESTEROL: 166 mg/dL (ref 0–200)
HDL: 45 mg/dL (ref 35–70)
LDL CALC: 92 mg/dL
TRIGLYCERIDES: 143 mg/dL (ref 40–160)

## 2014-09-29 LAB — CBC AND DIFFERENTIAL
HEMOGLOBIN: 15.6 g/dL (ref 13.5–17.5)
PLATELETS: 244 10*3/uL (ref 150–399)
WBC: 5.4 10^3/mL

## 2014-09-29 LAB — TSH: TSH: 0.86 u[IU]/mL (ref 0.41–5.90)

## 2014-10-07 ENCOUNTER — Other Ambulatory Visit: Payer: Self-pay | Admitting: Urology

## 2014-10-07 DIAGNOSIS — N281 Cyst of kidney, acquired: Secondary | ICD-10-CM

## 2014-10-12 ENCOUNTER — Ambulatory Visit
Admission: RE | Admit: 2014-10-12 | Discharge: 2014-10-12 | Disposition: A | Discharge status: Home / Self Care | Payer: 59 | Source: Ambulatory Visit | Attending: Urology | Admitting: Urology

## 2014-10-12 DIAGNOSIS — N281 Cyst of kidney, acquired: Secondary | ICD-10-CM

## 2014-11-17 LAB — PSA: PSA: 0.73

## 2014-12-14 ENCOUNTER — Other Ambulatory Visit: Payer: Self-pay | Admitting: Internal Medicine

## 2015-01-18 ENCOUNTER — Telehealth: Payer: Self-pay | Admitting: Internal Medicine

## 2015-01-18 ENCOUNTER — Ambulatory Visit (INDEPENDENT_AMBULATORY_CARE_PROVIDER_SITE_OTHER): Payer: 59 | Admitting: Internal Medicine

## 2015-01-18 ENCOUNTER — Encounter: Payer: Self-pay | Admitting: Internal Medicine

## 2015-01-18 ENCOUNTER — Ambulatory Visit (INDEPENDENT_AMBULATORY_CARE_PROVIDER_SITE_OTHER)
Admission: RE | Admit: 2015-01-18 | Discharge: 2015-01-18 | Disposition: A | Discharge status: Home / Self Care | Payer: 59 | Source: Ambulatory Visit | Attending: Internal Medicine | Admitting: Internal Medicine

## 2015-01-18 ENCOUNTER — Other Ambulatory Visit (INDEPENDENT_AMBULATORY_CARE_PROVIDER_SITE_OTHER): Payer: 59

## 2015-01-18 VITALS — BP 120/70 | HR 58 | Temp 98.2°F | Ht 71.0 in | Wt 278.0 lb

## 2015-01-18 DIAGNOSIS — Z0000 Encounter for general adult medical examination without abnormal findings: Secondary | ICD-10-CM

## 2015-01-18 DIAGNOSIS — R05 Cough: Secondary | ICD-10-CM | POA: Diagnosis not present

## 2015-01-18 DIAGNOSIS — E785 Hyperlipidemia, unspecified: Secondary | ICD-10-CM

## 2015-01-18 DIAGNOSIS — E890 Postprocedural hypothyroidism: Secondary | ICD-10-CM | POA: Diagnosis not present

## 2015-01-18 DIAGNOSIS — I10 Essential (primary) hypertension: Secondary | ICD-10-CM

## 2015-01-18 DIAGNOSIS — E041 Nontoxic single thyroid nodule: Secondary | ICD-10-CM | POA: Insufficient documentation

## 2015-01-18 DIAGNOSIS — R059 Cough, unspecified: Secondary | ICD-10-CM

## 2015-01-18 DIAGNOSIS — E669 Obesity, unspecified: Secondary | ICD-10-CM

## 2015-01-18 LAB — HEPATIC FUNCTION PANEL
ALK PHOS: 49 U/L (ref 39–117)
ALT: 20 U/L (ref 0–53)
AST: 20 U/L (ref 0–37)
Albumin: 4.4 g/dL (ref 3.5–5.2)
BILIRUBIN DIRECT: 0.1 mg/dL (ref 0.0–0.3)
TOTAL PROTEIN: 6.8 g/dL (ref 6.0–8.3)
Total Bilirubin: 0.7 mg/dL (ref 0.2–1.2)

## 2015-01-18 LAB — CBC WITH DIFFERENTIAL/PLATELET
BASOS ABS: 0 10*3/uL (ref 0.0–0.1)
Basophils Relative: 0.3 % (ref 0.0–3.0)
EOS ABS: 0.1 10*3/uL (ref 0.0–0.7)
Eosinophils Relative: 1.9 % (ref 0.0–5.0)
HEMATOCRIT: 48.5 % (ref 39.0–52.0)
Hemoglobin: 16.4 g/dL (ref 13.0–17.0)
LYMPHS PCT: 30 % (ref 12.0–46.0)
Lymphs Abs: 2.1 10*3/uL (ref 0.7–4.0)
MCHC: 33.8 g/dL (ref 30.0–36.0)
MCV: 90.3 fl (ref 78.0–100.0)
Monocytes Absolute: 0.6 10*3/uL (ref 0.1–1.0)
Monocytes Relative: 8.9 % (ref 3.0–12.0)
NEUTROS ABS: 4.1 10*3/uL (ref 1.4–7.7)
NEUTROS PCT: 58.9 % (ref 43.0–77.0)
PLATELETS: 341 10*3/uL (ref 150.0–400.0)
RBC: 5.38 Mil/uL (ref 4.22–5.81)
RDW: 13.4 % (ref 11.5–15.5)
WBC: 6.9 10*3/uL (ref 4.0–10.5)

## 2015-01-18 LAB — BASIC METABOLIC PANEL
BUN: 11 mg/dL (ref 6–23)
CALCIUM: 9.8 mg/dL (ref 8.4–10.5)
CO2: 31 meq/L (ref 19–32)
CREATININE: 0.86 mg/dL (ref 0.40–1.50)
Chloride: 102 mEq/L (ref 96–112)
GFR: 95.39 mL/min (ref >60.00)
Glucose, Bld: 95 mg/dL (ref 70–99)
Potassium: 5 mEq/L (ref 3.5–5.1)
Sodium: 141 mEq/L (ref 135–145)

## 2015-01-18 LAB — PSA: PSA: 0.68 ng/mL (ref 0.10–4.00)

## 2015-01-18 LAB — TSH: TSH: 0.83 u[IU]/mL (ref 0.35–4.50)

## 2015-01-18 LAB — LIPID PANEL
CHOL/HDL RATIO: 4
Cholesterol: 160 mg/dL (ref 0–200)
HDL: 40 mg/dL (ref >39.00)
LDL Cholesterol: 90 mg/dL (ref 0–99)
NONHDL: 120.1
Triglycerides: 153 mg/dL — ABNORMAL HIGH (ref 0.0–149.0)
VLDL: 30.6 mg/dL (ref 0.0–40.0)

## 2015-01-18 MED ORDER — TRAZODONE HCL 100 MG PO TABS: 50.0000 mg | ORAL_TABLET | Freq: Every evening | ORAL | Status: DC | PRN

## 2015-01-18 MED ORDER — LEVOTHYROXINE SODIUM 175 MCG PO TABS: 175.0000 ug | ORAL_TABLET | Freq: Every day | ORAL | Status: DC

## 2015-01-18 MED ORDER — BENZONATATE 200 MG PO CAPS: 200.0000 mg | ORAL_CAPSULE | Freq: Two times a day (BID) | ORAL | Status: DC | PRN

## 2015-01-18 MED ORDER — VALSARTAN 80 MG PO TABS: 80.0000 mg | ORAL_TABLET | Freq: Two times a day (BID) | ORAL | Status: DC

## 2015-01-18 NOTE — Assessment & Plan Note (Signed)
BP Readings from Last 3 Encounters:  01/18/15 120/70  01/12/14 120/78  12/10/13 116/58   weaning self from antihypertensive with aerobic exercise and weight reduction. Continue to monitor same The current medical regimen is effective;  continue present plan and medications.

## 2015-01-18 NOTE — Progress Notes (Signed)
Subjective:    Patient ID: Jonathan Powers, male    DOB: [birth year], 64 y.o.   MRN: [id number]  HPI  patient is here today for annual physical. Patient feels well overall Also reviewed chronic medical conditions, interval events and current concerns   Past Medical History  Diagnosis Date  . Hypertension   . OSTEOARTHRITIS   . CELLULITIS, LEG, RIGHT     Recurrent R hip cellulitis 1/08,3/08   . ALLERGIC RHINITIS   . Migraines     Atypical and ocular  . Hypothyroid   . Diverticulitis   . GERD (gastroesophageal reflux disease)   . Gout   . Hypogonadism male     low T, a/w ED  . Hashimoto's thyroiditis   . Left thyroid nodule 2008    on [place], decrease size in 2011 [place]   Family History  Problem Relation Age of Onset  . Arthritis Mother   . Diabetes Mother   . Arthritis Father   . Heart disease Father   . Diabetes Father   . Cancer Paternal Grandmother     "GI"  . Cancer Paternal Grandfather     stomach cancer  . Multiple sclerosis Mother   . Vascular Disease Father     AoBifem  . Thyroid cancer Sister   . Uterine cancer Sister    Social History  Substance Use Topics  . Smoking status: Never Smoker   . Smokeless tobacco: None  . Alcohol Use: 0.0 oz/week    0 Standard drinks or equivalent per week     Comment: rare     Review of Systems  Constitutional: Negative for fever, activity change, appetite change, fatigue and unexpected weight change.  HENT: Positive for postnasal drip, rhinorrhea, sinus pressure and trouble swallowing (?thyroid nodules increase in size). Negative for facial swelling.   Respiratory: Positive for cough (nonprod x 2 mo, ?PND or other). Negative for chest tightness, shortness of breath and wheezing.   Cardiovascular: Negative for chest pain, palpitations and leg swelling.  Allergic/Immunologic: Positive for environmental allergies.  Neurological: Negative for dizziness, weakness and headaches.  Psychiatric/Behavioral: Negative for  dysphoric mood. The patient is not nervous/anxious.   All other systems reviewed and are negative.      Objective:    Physical Exam  Constitutional: He is oriented to person, place, and time. He appears well-developed and well-nourished. No distress.  obese  HENT:  Head: Normocephalic and atraumatic.  Nose: Nose normal.  Mouth/Throat: Oropharynx is clear and moist.  Hearing grossly normal.  Eyes: Conjunctivae and EOM are normal. Pupils are equal, round, and reactive to light. No scleral icterus.  Neck: Normal range of motion. Neck supple. No JVD present. No thyromegaly present.  Cardiovascular: Normal rate, regular rhythm, normal heart sounds and intact distal pulses.  Exam reveals no friction rub.   No murmur heard. No edema.  Pulmonary/Chest: Effort normal and breath sounds normal. No respiratory distress. He has no wheezes.  Abdominal: Soft. Bowel sounds are normal. He exhibits no distension and no mass. There is no tenderness. There is no guarding.  Genitourinary:  defer  Musculoskeletal: Normal range of motion. He exhibits no edema or tenderness.  Lymphadenopathy:    He has no cervical adenopathy.  Neurological: He is alert and oriented to person, place, and time. He has normal reflexes. No cranial nerve deficit.  Skin: Skin is warm and dry. No rash noted. No erythema.  Psychiatric: He has a normal mood and affect. His behavior is normal. Thought  content normal.  Vitals reviewed.   BP 120/70 mmHg  Pulse 58  Temp(Src) 98.2 F (36.8 C) (Oral)  Ht 5\' 11"  (1.803 m)  Wt 278 lb (126.1 kg)  BMI 38.79 kg/m2  SpO2 95% Wt Readings from Last 3 Encounters:  01/18/15 278 lb (126.1 kg)  01/12/14 300 lb 5 oz (136.221 kg)  12/10/13 307 lb 6.4 oz (139.436 kg)    Lab Results  Component Value Date   WBC 5.4 09/29/2014   HGB 15.6 09/29/2014   HCT 47.5 08/10/2012   PLT 244 09/29/2014   GLUCOSE 110* 08/10/2012   CHOL 166 09/29/2014   TRIG 143 09/29/2014   HDL 45 09/29/2014    LDLCALC 92 09/29/2014   ALT 26 09/29/2014   AST 31 09/29/2014   NA 141 09/29/2014   K 4.3 09/29/2014   CL 97 08/10/2012   CREATININE 0.9 09/29/2014   BUN 12 09/29/2014   CO2 30 08/10/2012   TSH 0.86 09/29/2014   INR 2.4* 07/29/2006   HGBA1C 5.6 09/29/2014    US Renal  10/12/2014  CLINICAL DATA:  Followup lower pole left renal cyst. EXAM: RENAL / URINARY TRACT ULTRASOUND COMPLETE COMPARISON:  CT scan 07/14/2010 and ultrasound 07/26/2009 FINDINGS: Right Kidney: Length: 13.4 cm. Normal renal cortical thickness and echogenicity without focal lesions or hydronephrosis. Left Kidney: Length: 12.6 cm. Normal renal cortical thickness and echogenicity without focal lesions or hydronephrosis. There is a simple appearing lower pole left renal cyst. It measures 4.5 x 3.3 x 3.3 cm. It measured 3.8 x 3.3 x 3.0 cm on the prior CT scan Bladder: Normal. IMPRESSION: Slight interval enlargement of the lower pole left renal cyst. No worrisome sonographic features. Electronically Signed   By: Marijo Sanes M.D.   On: 10/12/2014 15:47       Assessment & Plan:   CPX/z00.00 - Patient has been counseled on age-appropriate routine health concerns for screening and prevention. These are reviewed and up-to-date. Immunizations are up-to-date or declined. Labs ordered and reviewed.  Nonproductive cough 2 months. Reports cough symptoms improved with exercise and does not disturb him at night. Suspect related to postnasal drip and allergic rhinitis symptoms, but check chest x-ray to exclude underlying pulmonary disease. Continue antihistamine and nasal steroid. Add Tessalon 3 times a day for cough Follow-up ENT or pulmonary if persisting symptoms  Problem List Items Addressed This Visit    Dyslipidemia    LDL > 100 on last CPX review Given other cardiac risk factors and ongoing risk stratification for potential CAD, began atorvastatin 10 mg daily 08/2012 (reports previous trial of pravastatin caused myalgias) Patient  initiated weight loss program with regular aerobic exercise via cycling and self discontinued statin summer 2015 recheck now -consider initiation of same again as needed given family history CAD/PhD      Essential hypertension    BP Readings from Last 3 Encounters:  01/18/15 120/70  01/12/14 120/78  12/10/13 116/58   weaning self from antihypertensive with aerobic exercise and weight reduction. Continue to monitor same The current medical regimen is effective;  continue present plan and medications.       Relevant Medications   valsartan (DIOVAN) 80 MG tablet   Hypothyroid    Lab Results  Component Value Date   TSH 0.86 09/29/2014  recheck TFTs now The current medical regimen is effective;  continue present plan and medications.      Relevant Medications   levothyroxine (SYNTHROID, LEVOTHROID) 175 MCG tablet   Other Relevant Orders  TSH   Left thyroid nodule    Increasing symptoms with swallowing, similar to prior symptoms when thyroid nodule first dx in 2008 Decrease size nodule on 2011 [place] Recheck now      Relevant Medications   levothyroxine (SYNTHROID, LEVOTHROID) 175 MCG tablet   Other Relevant Orders   US Soft Tissue Head/Neck   TSH   OBESITY NOS    Wt Readings from Last 3 Encounters:  01/18/15 278 lb (126.1 kg)  01/12/14 300 lb 5 oz (136.221 kg)  12/10/13 307 lb 6.4 oz (139.436 kg)   The patient is asked to make an attempt to improve diet and exercise patterns to aid in medical management of this problem.       Other Visit Diagnoses    Routine general medical examination at a health care facility    -  Primary    Relevant Orders    Basic metabolic panel    CBC with Differential/Platelet    Hepatic function panel    Lipid panel    TSH    PSA    Cough        Relevant Orders    DG Chest 2 View        Gwendolyn Grant, MD

## 2015-01-18 NOTE — Telephone Encounter (Signed)
yes

## 2015-01-18 NOTE — Telephone Encounter (Signed)
Patient is under dr Asa Lente at this point. Requesting to be taken under your care. Is this ok with you?

## 2015-01-18 NOTE — Assessment & Plan Note (Signed)
Increasing symptoms with swallowing, similar to prior symptoms when thyroid nodule first dx in 2008 Decrease size nodule on 2011 [place] Recheck now

## 2015-01-18 NOTE — Progress Notes (Signed)
Pre visit review using our clinic review tool, if applicable. No additional management support is needed unless otherwise documented below in the visit note. 

## 2015-01-18 NOTE — Assessment & Plan Note (Signed)
LDL > 100 on last CPX review Given other cardiac risk factors and ongoing risk stratification for potential CAD, began atorvastatin 10 mg daily 08/2012 (reports previous trial of pravastatin caused myalgias) Patient initiated weight loss program with regular aerobic exercise via cycling and self discontinued statin summer 2015 recheck now -consider initiation of same again as needed given family history CAD/PhD

## 2015-01-18 NOTE — Patient Instructions (Addendum)
It was good to see you today.  We have reviewed your prior records including labs and tests today  Health Maintenance reviewed -please call our office regarding vaccination and shingles vaccination as discussed - all recommended immunizations and age-appropriate screenings are up-to-date.  Test(s) ordered today -labs and chest x-ray. Your results will be released to [facility] (or called to you) after review, usually within 72hours after test completion. If any changes need to be made, you will be notified at that same time.  Medications reviewed and updated Change trazodone to 100 mg tablets and change Diovan to 80 mg tablets No other changes recommended at this time.  We will make referral for thyroid ultrasound as discussed. We will call this appointment and notify you with results after review  Please schedule followup in 12 months for annual exam and labs, call sooner if problems.

## 2015-01-18 NOTE — Assessment & Plan Note (Signed)
Wt Readings from Last 3 Encounters:  01/18/15 278 lb (126.1 kg)  01/12/14 300 lb 5 oz (136.221 kg)  12/10/13 307 lb 6.4 oz (139.436 kg)   The patient is asked to make an attempt to improve diet and exercise patterns to aid in medical management of this problem.

## 2015-01-18 NOTE — Assessment & Plan Note (Signed)
Lab Results  Component Value Date   TSH 0.86 09/29/2014  recheck TFTs now The current medical regimen is effective;  continue present plan and medications.

## 2015-01-22 ENCOUNTER — Ambulatory Visit
Admission: RE | Admit: 2015-01-22 | Discharge: 2015-01-22 | Disposition: A | Discharge status: Home / Self Care | Payer: 59 | Source: Ambulatory Visit | Attending: Internal Medicine | Admitting: Internal Medicine

## 2015-01-22 DIAGNOSIS — E041 Nontoxic single thyroid nodule: Secondary | ICD-10-CM

## 2015-02-12 ENCOUNTER — Telehealth: Payer: 59 | Admitting: Internal Medicine

## 2015-02-12 DIAGNOSIS — J209 Acute bronchitis, unspecified: Secondary | ICD-10-CM | POA: Diagnosis not present

## 2015-02-12 MED ORDER — AZITHROMYCIN 250 MG PO TABS: ORAL_TABLET | ORAL | Status: DC

## 2015-02-12 MED ORDER — BENZONATATE 100 MG PO CAPS: 100.0000 mg | ORAL_CAPSULE | Freq: Two times a day (BID) | ORAL | Status: DC | PRN

## 2015-02-12 NOTE — Progress Notes (Signed)

## 2015-03-12 MED FILL — ANDROGEL 1.62% GEL PUMP: 20.25 MG/AC | 30 days supply | Qty: 150 | Fill #4

## 2015-03-12 MED FILL — CIALIS 5 MG TABLET: 5 | 30 days supply | Qty: 30 | Fill #4

## 2015-03-12 MED FILL — traZODone HCL 50 MG TABS: 50 | 30 days supply | Qty: 30 | Fill #3

## 2015-03-12 MED FILL — DICLOFENAC SODIUM 1% GEL: 1 | 30 days supply | Qty: 1000 | Fill #7

## 2015-03-12 MED FILL — valACYclovir HCL 1 GM TABS: 1 | 10 days supply | Qty: 20 | Fill #4

## 2015-03-15 MED FILL — LEVOTHYROXINE 175 MCG TAB: 175 | 90 days supply | Qty: 90 | Fill #0

## 2015-03-17 ENCOUNTER — Encounter: Payer: Self-pay | Admitting: Internal Medicine

## 2015-03-17 MED FILL — traZODone HCL 100 MG TABS: 100 | 90 days supply | Qty: 90 | Fill #0

## 2015-03-21 DIAGNOSIS — G4733 Obstructive sleep apnea (adult) (pediatric): Secondary | ICD-10-CM | POA: Diagnosis not present

## 2015-03-29 DIAGNOSIS — R0982 Postnasal drip: Secondary | ICD-10-CM | POA: Diagnosis not present

## 2015-03-29 DIAGNOSIS — G4733 Obstructive sleep apnea (adult) (pediatric): Secondary | ICD-10-CM | POA: Diagnosis not present

## 2015-03-29 DIAGNOSIS — H903 Sensorineural hearing loss, bilateral: Secondary | ICD-10-CM | POA: Diagnosis not present

## 2015-03-29 DIAGNOSIS — Z9989 Dependence on other enabling machines and devices: Secondary | ICD-10-CM | POA: Diagnosis not present

## 2015-03-29 DIAGNOSIS — R0981 Nasal congestion: Secondary | ICD-10-CM | POA: Diagnosis not present

## 2015-03-29 DIAGNOSIS — H9313 Tinnitus, bilateral: Secondary | ICD-10-CM | POA: Diagnosis not present

## 2015-04-16 MED FILL — CIALIS 5 MG TABLET: 5 | 30 days supply | Qty: 30 | Fill #5

## 2015-04-16 MED FILL — DICLOFENAC SODIUM 1% GEL: 1 | 30 days supply | Qty: 1000 | Fill #8

## 2015-04-20 DIAGNOSIS — G4733 Obstructive sleep apnea (adult) (pediatric): Secondary | ICD-10-CM | POA: Diagnosis not present

## 2015-04-23 MED FILL — ANDROGEL 1.62% GEL PUMP: 20.25 MG/AC | 30 days supply | Qty: 150 | Fill #5

## 2015-04-28 DIAGNOSIS — E559 Vitamin D deficiency, unspecified: Secondary | ICD-10-CM | POA: Diagnosis not present

## 2015-04-28 DIAGNOSIS — Z79899 Other long term (current) drug therapy: Secondary | ICD-10-CM | POA: Diagnosis not present

## 2015-04-28 DIAGNOSIS — R5381 Other malaise: Secondary | ICD-10-CM | POA: Diagnosis not present

## 2015-04-28 DIAGNOSIS — Z125 Encounter for screening for malignant neoplasm of prostate: Secondary | ICD-10-CM | POA: Diagnosis not present

## 2015-04-28 DIAGNOSIS — M2550 Pain in unspecified joint: Secondary | ICD-10-CM | POA: Diagnosis not present

## 2015-05-17 MED FILL — CIALIS 5 MG TABLET: 5 | 30 days supply | Qty: 30 | Fill #6

## 2015-05-17 MED FILL — DICLOFENAC SODIUM 1% GEL: 1 | 30 days supply | Qty: 1000 | Fill #9

## 2015-05-20 DIAGNOSIS — G4733 Obstructive sleep apnea (adult) (pediatric): Secondary | ICD-10-CM | POA: Diagnosis not present

## 2015-05-21 MED FILL — ANDROGEL 1.62% GEL PUMP: 20.25 MG/AC | 30 days supply | Qty: 150 | Fill #0

## 2015-06-05 ENCOUNTER — Ambulatory Visit (HOSPITAL_COMMUNITY)
Admission: EM | Admit: 2015-06-05 | Discharge: 2015-06-05 | Disposition: A | Discharge status: Home / Self Care | Payer: 59 | Attending: Emergency Medicine | Admitting: Emergency Medicine

## 2015-06-05 ENCOUNTER — Encounter (HOSPITAL_COMMUNITY): Payer: Self-pay | Admitting: *Deleted

## 2015-06-05 DIAGNOSIS — J111 Influenza due to unidentified influenza virus with other respiratory manifestations: Secondary | ICD-10-CM

## 2015-06-05 HISTORY — DX: Other seasonal allergic rhinitis: J30.2

## 2015-06-05 MED ORDER — ACETAMINOPHEN 325 MG PO TABS
ORAL_TABLET | ORAL | Status: AC
  Filled 2015-06-05: qty 3

## 2015-06-05 MED ORDER — ALBUTEROL SULFATE HFA 108 (90 BASE) MCG/ACT IN AERS: 2.0000 | INHALATION_SPRAY | RESPIRATORY_TRACT | Status: DC | PRN

## 2015-06-05 MED ORDER — OSELTAMIVIR PHOSPHATE 75 MG PO CAPS: 75.0000 mg | ORAL_CAPSULE | Freq: Two times a day (BID) | ORAL | Status: DC

## 2015-06-05 MED ORDER — ACETAMINOPHEN 325 MG PO TABS
975.0000 mg | ORAL_TABLET | Freq: Once | ORAL | Status: AC
  Administered 2015-06-05: 975 mg via ORAL

## 2015-06-05 NOTE — Discharge Instructions (Signed)
You have the flu. Get plenty of rest and drink plenty of fluids. Start the Tamiflu tonight. This will shorten duration of symptoms. Use the albuterol as needed for wheezing. Take Tylenol as needed for fevers. Quarantine yourself from your daughter.  Make sure you both wash your hands frequently. Follow-up as needed.

## 2015-06-05 NOTE — ED Provider Notes (Signed)
CSN: [id number]     Arrival date & time 06/05/15  1946 History   First MD Initiated Contact with Patient 06/05/15 2108     Chief Complaint  Patient presents with  . Fever   (Consider location/radiation/quality/duration/timing/severity/associated sxs/prior Treatment) HPI  He is a 65 year old man here for evaluation of fever. He states the last several days he has had allergy type symptoms has been taking an antihistamine. This afternoon he suddenly developed fever, body ache, cough, and intermittent wheezing and shortness of breath. He has taken Tylenol with temporary improvement of the fever. He did get a flu shot.  Past Medical History  Diagnosis Date  . Hypertension   . OSTEOARTHRITIS   . CELLULITIS, LEG, RIGHT     Recurrent R hip cellulitis 1/08,3/08   . ALLERGIC RHINITIS   . Migraines     Atypical and ocular  . Hypothyroid   . Diverticulitis   . GERD (gastroesophageal reflux disease)   . Gout   . Hypogonadism male     low T, a/w ED  . Hashimoto's thyroiditis   . Left thyroid nodule 2008    on [place], decrease size in 2011 [place]  . Seasonal allergies    Past Surgical History  Procedure Laterality Date  . Right shoulder cuff repair Right 09/15/11  . Right shoulder sad, dcr Right 02/05/11  . Total hip arthroplasty Left 07/26/06  . Total hip arthroplasty Right 1999  . Cholecystectomy  2004  . Repair digital thumb, left Left 1990  . Ctr Bilateral 1982  . Cystoscopy  1974  . Open reduction l little finger Left 1970   Family History  Problem Relation Age of Onset  . Arthritis Mother   . Diabetes Mother   . Arthritis Father   . Heart disease Father   . Diabetes Father   . Cancer Paternal Grandmother     "GI"  . Cancer Paternal Grandfather     stomach cancer  . Multiple sclerosis Mother   . Vascular Disease Father     AoBifem  . Thyroid cancer Sister   . Uterine cancer Sister    Social History  Substance Use Topics  . Smoking status: Never Smoker   . Smokeless tobacco:  None  . Alcohol Use: 0.0 oz/week    0 Standard drinks or equivalent per week     Comment: rare    Review of Systems As in history of present illness Allergies  Nsaids and Penicillins  Home Medications   Prior to Admission medications   Medication Sig Start Date End Date Taking? Authorizing Provider  levothyroxine (SYNTHROID, LEVOTHROID) 175 MCG tablet Take 1 tablet (175 mcg total) by mouth daily before breakfast. 01/18/15  Yes Rowe Clack, MD  tadalafil (CIALIS) 5 MG tablet Take 1 tablet (5 mg total) by mouth daily as needed for erectile dysfunction. 07/23/14  Yes Rowe Clack, MD  traZODone (DESYREL) 100 MG tablet Take 0.5-1 tablets (50-100 mg total) by mouth at bedtime as needed for sleep. 01/18/15  Yes Rowe Clack, MD  valsartan (DIOVAN) 80 MG tablet Take 1 tablet (80 mg total) by mouth 2 (two) times daily. Patient takes up to twice a day. 01/18/15  Yes Rowe Clack, MD  acetaminophen (TYLENOL) 500 MG tablet Take 1,000 mg by mouth 2 (two) times daily.    Historical Provider, MD  albuterol (PROVENTIL HFA;VENTOLIN HFA) 108 (90 Base) MCG/ACT inhaler Inhale 2 puffs into the lungs every 4 (four) hours as needed for wheezing or shortness  of breath. 06/05/15   Melony Overly, MD  azithromycin (ZITHROMAX) 250 MG tablet Take two tabs on day one, then one daily for the next 4 days. 02/12/15   Mickel Baas A [place], PA-C  benzonatate (TESSALON) 100 MG capsule Take 1 capsule (100 mg total) by mouth 2 (two) times daily as needed for cough. 02/12/15   Mickel Baas A [place]uk, PA-C  benzonatate (TESSALON) 200 MG capsule Take 1 capsule (200 mg total) by mouth 2 (two) times daily as needed for cough. 01/18/15   Rowe Clack, MD  cetirizine (ZYRTEC) 10 MG tablet Take 10 mg by mouth daily.    Historical Provider, MD  Cholecalciferol (VITAMIN D-3) 5000 UNITS TABS Take by mouth. Takes Monday thru [place] Provider, MD  diclofenac sodium (VOLTAREN) 1 % GEL Apply 2 g topically 4 (four)  times daily as needed (pain).     Historical Provider, MD  Esomeprazole Magnesium (NEXIUM 24HR PO) Take by mouth daily.    Historical Provider, MD  febuxostat (ULORIC) 40 MG tablet Take 40 mg by mouth daily.     Historical Provider, MD  oseltamivir (TAMIFLU) 75 MG capsule Take 1 capsule (75 mg total) by mouth every 12 (twelve) hours. 06/05/15   Melony Overly, MD  Testosterone 20.25 MG/ACT (1.62%) GEL Place 4 Squirts onto the skin daily. 02/10/14   Rowe Clack, MD  valACYclovir (VALTREX) 1000 MG tablet  01/12/15   Historical Provider, MD   Meds Ordered and Administered this Visit  Medications - No data to display  BP 155/89 mmHg  Pulse 80  Temp(Src) 102.5 F (39.2 C) (Oral)  SpO2 97% No data found.   Physical Exam  Constitutional: He is oriented to person, place, and time. He appears well-developed and well-nourished. No distress.  Neck: Neck supple.  Cardiovascular: Normal rate, regular rhythm and normal heart sounds.   No murmur heard. Pulmonary/Chest: Effort normal and breath sounds normal. No respiratory distress. He has no wheezes. He has no rales.  Lymphadenopathy:    He has no cervical adenopathy.  Neurological: He is alert and oriented to person, place, and time.    ED Course  Procedures (including critical care time)  Labs Review Labs Reviewed - No data to display  Imaging Review No results found.    MDM   1. Flu    History is consistent with flu. We'll treat with Tamiflu. Prescription sent for albuterol to use as needed for wheezing. Discussed symptomatic treatment with rest and fluids. Tylenol as needed for fevers. Follow-up as needed.    Melony Overly, MD 06/05/15 2141

## 2015-06-05 NOTE — ED Notes (Signed)
C/O allergy-type sxs, then had rapid onset fever up to 102.1, cough, generalized body aches, chills today.  Has been taking Mucinex and Tyl (last dose > 6 hrs ago).  Faint audible wheezing noted.

## 2015-06-06 ENCOUNTER — Telehealth: Payer: 59 | Admitting: Family

## 2015-06-06 DIAGNOSIS — J988 Other specified respiratory disorders: Secondary | ICD-10-CM

## 2015-06-06 DIAGNOSIS — B9689 Other specified bacterial agents as the cause of diseases classified elsewhere: Principal | ICD-10-CM

## 2015-06-06 MED ORDER — BENZONATATE 100 MG PO CAPS: 100.0000 mg | ORAL_CAPSULE | Freq: Three times a day (TID) | ORAL | Status: DC | PRN

## 2015-06-06 MED ORDER — AZITHROMYCIN 250 MG PO TABS: ORAL_TABLET | ORAL | Status: DC

## 2015-06-06 NOTE — Progress Notes (Signed)

## 2015-06-16 MED FILL — VALSARTAN 80 MG TABLET: 80 | 90 days supply | Qty: 180 | Fill #1

## 2015-06-16 MED FILL — valACYclovir HCL 1 GM TABS: 1 | 10 days supply | Qty: 20 | Fill #5

## 2015-06-16 MED FILL — traZODone HCL 100 MG TABS: 100 | 90 days supply | Qty: 90 | Fill #1

## 2015-06-16 MED FILL — DICLOFENAC SODIUM 1% GEL: 1 | 30 days supply | Qty: 1000 | Fill #10

## 2015-06-16 MED FILL — ULORIC 80 MG TABLET: 80 | 90 days supply | Qty: 90 | Fill #2

## 2015-06-16 MED FILL — CIALIS 5 MG TABLET: 5 | 30 days supply | Qty: 30 | Fill #7

## 2015-06-16 MED FILL — LEVOTHYROXINE 175 MCG TAB: 175 | 90 days supply | Qty: 90 | Fill #1

## 2015-06-18 MED FILL — ANDROGEL 1.62% GEL PUMP: 20.25 MG/AC | 30 days supply | Qty: 150 | Fill #1

## 2015-06-19 DIAGNOSIS — G4733 Obstructive sleep apnea (adult) (pediatric): Secondary | ICD-10-CM | POA: Diagnosis not present

## 2015-06-23 MED FILL — MUSE 500 MCG URETHRAL SUPP: 500 | 30 days supply | Qty: 6 | Fill #0

## 2015-07-12 ENCOUNTER — Other Ambulatory Visit: Payer: Self-pay | Admitting: Internal Medicine

## 2015-07-12 ENCOUNTER — Encounter: Payer: Self-pay | Admitting: Internal Medicine

## 2015-07-12 ENCOUNTER — Ambulatory Visit (INDEPENDENT_AMBULATORY_CARE_PROVIDER_SITE_OTHER): Payer: 59 | Admitting: Internal Medicine

## 2015-07-12 ENCOUNTER — Other Ambulatory Visit (INDEPENDENT_AMBULATORY_CARE_PROVIDER_SITE_OTHER): Payer: 59

## 2015-07-12 VITALS — BP 118/60 | HR 61 | Temp 98.5°F | Resp 16 | Ht 71.0 in | Wt 284.0 lb

## 2015-07-12 DIAGNOSIS — H9313 Tinnitus, bilateral: Secondary | ICD-10-CM | POA: Diagnosis not present

## 2015-07-12 DIAGNOSIS — I728 Aneurysm of other specified arteries: Secondary | ICD-10-CM

## 2015-07-12 DIAGNOSIS — R1084 Generalized abdominal pain: Secondary | ICD-10-CM | POA: Diagnosis not present

## 2015-07-12 DIAGNOSIS — E038 Other specified hypothyroidism: Secondary | ICD-10-CM | POA: Diagnosis not present

## 2015-07-12 DIAGNOSIS — R202 Paresthesia of skin: Secondary | ICD-10-CM

## 2015-07-12 DIAGNOSIS — H9319 Tinnitus, unspecified ear: Secondary | ICD-10-CM | POA: Insufficient documentation

## 2015-07-12 DIAGNOSIS — R51 Headache: Secondary | ICD-10-CM | POA: Diagnosis not present

## 2015-07-12 DIAGNOSIS — R519 Headache, unspecified: Secondary | ICD-10-CM

## 2015-07-12 DIAGNOSIS — Z23 Encounter for immunization: Secondary | ICD-10-CM | POA: Diagnosis not present

## 2015-07-12 HISTORY — DX: Aneurysm of other specified arteries: I72.8

## 2015-07-12 LAB — COMPREHENSIVE METABOLIC PANEL
ALT: 18 U/L (ref 0–53)
AST: 18 U/L (ref 0–37)
Albumin: 4.5 g/dL (ref 3.5–5.2)
Alkaline Phosphatase: 42 U/L (ref 39–117)
BILIRUBIN TOTAL: 1 mg/dL (ref 0.2–1.2)
BUN: 17 mg/dL (ref 6–23)
CALCIUM: 9.5 mg/dL (ref 8.4–10.5)
CO2: 28 meq/L (ref 19–32)
Chloride: 104 mEq/L (ref 96–112)
Creatinine, Ser: 0.84 mg/dL (ref 0.40–1.50)
GFR: 97.86 mL/min (ref >60.00)
Glucose, Bld: 102 mg/dL — ABNORMAL HIGH (ref 70–99)
POTASSIUM: 4.4 meq/L (ref 3.5–5.1)
Sodium: 139 mEq/L (ref 135–145)
Total Protein: 7 g/dL (ref 6.0–8.3)

## 2015-07-12 LAB — CBC WITH DIFFERENTIAL/PLATELET
BASOS ABS: 0 10*3/uL (ref 0.0–0.1)
Basophils Relative: 0.4 % (ref 0.0–3.0)
EOS PCT: 1.4 % (ref 0.0–5.0)
Eosinophils Absolute: 0.1 10*3/uL (ref 0.0–0.7)
HEMATOCRIT: 47.2 % (ref 39.0–52.0)
Hemoglobin: 16.4 g/dL (ref 13.0–17.0)
LYMPHS PCT: 30.2 % (ref 12.0–46.0)
Lymphs Abs: 1.8 10*3/uL (ref 0.7–4.0)
MCHC: 34.7 g/dL (ref 30.0–36.0)
MCV: 88.6 fl (ref 78.0–100.0)
MONOS PCT: 8.5 % (ref 3.0–12.0)
Monocytes Absolute: 0.5 10*3/uL (ref 0.1–1.0)
NEUTROS ABS: 3.6 10*3/uL (ref 1.4–7.7)
Neutrophils Relative %: 59.5 % (ref 43.0–77.0)
PLATELETS: 299 10*3/uL (ref 150.0–400.0)
RBC: 5.33 Mil/uL (ref 4.22–5.81)
RDW: 13.9 % (ref 11.5–15.5)
WBC: 6.1 10*3/uL (ref 4.0–10.5)

## 2015-07-12 LAB — URINALYSIS, ROUTINE W REFLEX MICROSCOPIC
BILIRUBIN URINE: NEGATIVE
Hgb urine dipstick: NEGATIVE
KETONES UR: NEGATIVE
LEUKOCYTES UA: NEGATIVE
Nitrite: NEGATIVE
PH: 5 (ref 5.0–8.0)
RBC / HPF: NONE SEEN (ref 0–?)
Specific Gravity, Urine: 1.01 (ref 1.000–1.030)
TOTAL PROTEIN, URINE-UPE24: NEGATIVE
UROBILINOGEN UA: 0.2 (ref 0.0–1.0)
Urine Glucose: NEGATIVE
WBC, UA: NONE SEEN (ref 0–?)

## 2015-07-12 LAB — LIPASE: Lipase: 24 U/L (ref 11.0–59.0)

## 2015-07-12 LAB — FOLATE: Folate: >23.3 ng/mL (ref >5.9)

## 2015-07-12 LAB — AMYLASE: Amylase: 31 U/L (ref 27–131)

## 2015-07-12 LAB — TSH: TSH: 0.91 u[IU]/mL (ref 0.35–4.50)

## 2015-07-12 LAB — VITAMIN B12: VITAMIN B 12: 330 pg/mL (ref 211–911)

## 2015-07-12 NOTE — Progress Notes (Signed)
Pre visit review using our clinic review tool, if applicable. No additional management support is needed unless otherwise documented below in the visit note. 

## 2015-07-12 NOTE — Progress Notes (Signed)
Subjective:  Patient ID: Jonathan Powers, male    DOB: Dec 16, 1950  Age: 65 y.o. MRN: [id number]  CC: Abdominal Pain and Headache  NEW TO ME  HPI WEYLIN CHINNOCK presents for multiple complaints.  He complains of 4 week history of intermittent abdominal aching. He tells me that he has a history of an SMA aneurysm. The abdominal pain does not change with respect to eating. He has intentionally lost weight and denies loss of appetite, nausea, vomiting, diarrhea, or constipation.  Over the last year he has also experienced hearing loss and tinnitus in both ears. He has had some numbness and tingling around his right ear. He saw an ENT doctor and was told that his hearing test was okay but there was some concern that he might have a tumor in his brain or around his ear causing his symptoms and therefore he needs to have an MRI done. He rarely experiences headache and when he does it's diffuse, bilateral and over the top and frontal area of his scalp. He has had chronic paresthesias in his upper and lower extremities that he describes as intermittent numbness and tingling. He said the symptoms for many years.  History Gabryle has a past medical history of Hypertension; OSTEOARTHRITIS; CELLULITIS, LEG, RIGHT; ALLERGIC RHINITIS; Migraines; Hypothyroid; Diverticulitis; GERD (gastroesophageal reflux disease); Gout; Hypogonadism male; Hashimoto's thyroiditis; Left thyroid nodule (2008); and Seasonal allergies.   He has past surgical history that includes Right shoulder cuff repair (Right, 09/15/11); Right shoulder SAD, DCR (Right, 02/05/11); Total hip arthroplasty (Left, 07/26/06); Total hip arthroplasty (Right, 1999); Cholecystectomy (2004); Repair digital thumb, left (Left, 1990); CTR (Bilateral, 1982); Cystoscopy (1974); and open reduction L little finger (Left, 1970).   His family history includes Arthritis in his father and mother; Cancer in his paternal grandfather and paternal grandmother; Diabetes in  his father and mother; Heart disease in his father; Multiple sclerosis in his mother; Thyroid cancer in his sister; Uterine cancer in his sister; Vascular Disease in his father.He reports that he has never smoked. He does not have any smokeless tobacco history on file. He reports that he drinks alcohol. He reports that he does not use illicit drugs.  Outpatient Prescriptions Prior to Visit  Medication Sig Dispense Refill  . acetaminophen (TYLENOL) 500 MG tablet Take 1,000 mg by mouth 2 (two) times daily.    [name] albuterol (PROVENTIL HFA;VENTOLIN HFA) 108 (90 Base) MCG/ACT inhaler Inhale 2 puffs into the lungs every 4 (four) hours as needed for wheezing or shortness of breath. 1 Inhaler 0  . cetirizine (ZYRTEC) 10 MG tablet Take 10 mg by mouth daily.    . Cholecalciferol (VITAMIN D-3) 5000 UNITS TABS Take by mouth. Takes Monday thru friday    . diclofenac sodium (VOLTAREN) 1 % GEL Apply 2 g topically 4 (four) times daily as needed (pain).     . Esomeprazole Magnesium (NEXIUM 24HR PO) Take by mouth daily.    . febuxostat (ULORIC) 40 MG tablet Take 40 mg by mouth daily.     [name] levothyroxine (SYNTHROID, LEVOTHROID) 175 MCG tablet Take 1 tablet (175 mcg total) by mouth daily before breakfast. 90 tablet 3  . tadalafil (CIALIS) 5 MG tablet Take 1 tablet (5 mg total) by mouth daily as needed for erectile dysfunction. 18 tablet 0  . Testosterone 20.25 MG/ACT (1.62%) GEL Place 4 Squirts onto the skin daily. 150 g 5  . traZODone (DESYREL) 100 MG tablet Take 0.5-1 tablets (50-100 mg total) by mouth at bedtime as  needed for sleep. 90 tablet 3  . valACYclovir (VALTREX) 1000 MG tablet   11  . valsartan (DIOVAN) 80 MG tablet Take 1 tablet (80 mg total) by mouth 2 (two) times daily. Patient takes up to twice a day. 180 tablet 3  . benzonatate (TESSALON) 200 MG capsule Take 1 capsule (200 mg total) by mouth 2 (two) times daily as needed for cough. (Patient not taking: Reported on 07/12/2015) 40 capsule 1  . azithromycin  (ZITHROMAX) 250 MG tablet Take two tabs on day one, then one daily for the next 4 days. 6 tablet 0  . azithromycin (ZITHROMAX) 250 MG tablet Take two tablets now, then one tab daily times four days. 6 tablet 0  . benzonatate (TESSALON PERLES) 100 MG capsule Take 1-2 capsules (100-200 mg total) by mouth every 8 (eight) hours as needed. 30 capsule 0  . benzonatate (TESSALON) 100 MG capsule Take 1 capsule (100 mg total) by mouth 2 (two) times daily as needed for cough. 20 capsule 0  . oseltamivir (TAMIFLU) 75 MG capsule Take 1 capsule (75 mg total) by mouth every 12 (twelve) hours. 10 capsule 0   No facility-administered medications prior to visit.    ROS Review of Systems  Constitutional: Negative.  Negative for fever, chills, diaphoresis, appetite change and fatigue.  HENT: Negative for sinus pressure and trouble swallowing.   Eyes: Negative.  Negative for visual disturbance.  Respiratory: Negative.  Negative for cough, choking, chest tightness, shortness of breath and stridor.   Cardiovascular: Negative.  Negative for chest pain, palpitations and leg swelling.  Gastrointestinal: Positive for abdominal pain. Negative for nausea, vomiting, diarrhea and constipation.  Endocrine: Negative.   Genitourinary: Negative.  Negative for dysuria, urgency, hematuria and difficulty urinating.  Musculoskeletal: Negative.  Negative for myalgias, back pain, joint swelling and arthralgias.  Skin: Negative.  Negative for color change and rash.  Allergic/Immunologic: Negative.   Neurological: Positive for headaches. Negative for dizziness, tremors, seizures, syncope, facial asymmetry, speech difficulty, weakness, light-headedness and numbness.  Hematological: Negative.  Negative for adenopathy. Does not bruise/bleed easily.  Psychiatric/Behavioral: Negative.  The patient is not nervous/anxious.     Objective:  BP 118/60 mmHg  Pulse 61  Temp(Src) 98.5 F (36.9 C) (Oral)  Resp 16  Ht 5\' 11"  (1.803 m)  Wt  284 lb (128.822 kg)  BMI 39.63 kg/m2  SpO2 96%  Physical Exam  Constitutional: He is oriented to person, place, and time. No distress.  HENT:  Right Ear: Hearing, tympanic membrane, external ear and ear canal normal.  Left Ear: Hearing, tympanic membrane, external ear and ear canal normal.  Mouth/Throat: Oropharynx is clear and moist. No oropharyngeal exudate.  Eyes: Conjunctivae are normal. Right eye exhibits no discharge. Left eye exhibits no discharge. No scleral icterus.  Neck: Normal range of motion. Neck supple. No JVD present. No tracheal deviation present. No thyromegaly present.  Cardiovascular: Normal rate, regular rhythm, normal heart sounds and intact distal pulses.  Exam reveals no gallop and no friction rub.   No murmur heard. Pulmonary/Chest: Effort normal and breath sounds normal. No stridor. No respiratory distress. He has no wheezes. He has no rales. He exhibits no tenderness.  Abdominal: Soft. Normal appearance, normal aorta and bowel sounds are normal. He exhibits no distension and no mass. There is no hepatosplenomegaly, splenomegaly or hepatomegaly. There is tenderness in the periumbilical area and left upper quadrant. There is no rebound, no guarding and no CVA tenderness. No hernia. Hernia confirmed negative in the ventral  area.  Musculoskeletal: Normal range of motion. He exhibits no edema or tenderness.  Lymphadenopathy:    He has no cervical adenopathy.  Neurological: He is alert and oriented to person, place, and time. He has normal reflexes. He displays normal reflexes. No cranial nerve deficit. He exhibits normal muscle tone. Coordination normal.  Skin: Skin is warm and dry. No rash noted. He is not diaphoretic. No erythema. No pallor.  Psychiatric: He has a normal mood and affect. His behavior is normal. Judgment normal.  Vitals reviewed.   Lab Results  Component Value Date   WBC 6.1 07/12/2015   HGB 16.4 07/12/2015   HCT 47.2 07/12/2015   PLT 299.0  07/12/2015   GLUCOSE 102* 07/12/2015   CHOL 160 01/18/2015   TRIG 153.0* 01/18/2015   HDL 40.00 01/18/2015   LDLCALC 90 01/18/2015   ALT 18 07/12/2015   AST 18 07/12/2015   NA 139 07/12/2015   K 4.4 07/12/2015   CL 104 07/12/2015   CREATININE 0.84 07/12/2015   BUN 17 07/12/2015   CO2 28 07/12/2015   TSH 0.91 07/12/2015   PSA 0.68 01/18/2015   INR 2.4* 07/29/2006   HGBA1C 5.6 09/29/2014    Assessment & Plan:   Troi was seen today for abdominal pain and headache.  Diagnoses and all orders for this visit:  Other specified hypothyroidism- his TSH is in the normal range, he will continue the current dose of levothyroxine. -     TSH; Future  Intractable episodic headache, unspecified headache type- he will undergo an MRI of his brain to screen for mass, aneurysm, tumor. -     MR Brain Wo Contrast; Future  Tinnitus, bilateral- will scan his brain to screen for tumors that can cause tinnitus and hearing loss -     MR Brain Wo Contrast; Future  Paresthesia of upper and lower extremities of both sides- B12 and folate are within normal limits don't think this is the cause for his paresthesia, will see what the MRI of his brain shows. -     Folate; Future -     Vitamin B12; Future -     MR Brain Wo Contrast; Future  Superior mesenteric artery aneurysm ([place])- will get an MRI/MRA of the abdomen to see if there've been any changes in the SMA aneurysm. -     MR MRA ABDOMEN W WO CONTRAST; Future -     Cancel: MR Abdomen W Contrast; Future  Generalized abdominal pain- his labs are negative for any organic pathology, he is concerned about pancreatic cancer so I have ordered an MRI of his abdomen to screen for tumors -     Comprehensive metabolic panel; Future -     CBC with Differential/Platelet; Future -     Amylase; Future -     Lipase; Future -     Urinalysis, Routine w reflex microscopic (not at [facility]); Future -     MR MRA ABDOMEN W WO CONTRAST; Future -     Cancel: MR Abdomen W  Contrast; Future  Need for Tdap vaccination -     Tdap vaccine greater than or equal to 7yo IM  Need for 23-polyvalent pneumococcal polysaccharide vaccine -     Pneumococcal polysaccharide vaccine 23-valent greater than or equal to 2yo subcutaneous/IM  I have discontinued Mr. Mccully's azithromycin, oseltamivir, and azithromycin. I am also having him maintain his diclofenac sodium, cetirizine, Esomeprazole Magnesium (NEXIUM 24HR PO), febuxostat, acetaminophen, Vitamin D-3, Testosterone, tadalafil, valACYclovir, valsartan, levothyroxine, traZODone, benzonatate,  and albuterol.  No orders of the defined types were placed in this encounter.     Follow-up: Return in about 4 weeks (around 08/09/2015).  Scarlette Calico, MD

## 2015-07-12 NOTE — Patient Instructions (Signed)

## 2015-07-15 MED FILL — CIALIS 5 MG TABLET: 5 | 30 days supply | Qty: 30 | Fill #8

## 2015-07-20 ENCOUNTER — Telehealth: Payer: Self-pay

## 2015-07-20 DIAGNOSIS — G4733 Obstructive sleep apnea (adult) (pediatric): Secondary | ICD-10-CM | POA: Diagnosis not present

## 2015-07-20 MED FILL — ANDROGEL 1.62% GEL PUMP: 20.25 MG/AC | 30 days supply | Qty: 150 | Fill #2

## 2015-07-20 NOTE — Telephone Encounter (Signed)
Ok

## 2015-07-20 NOTE — Telephone Encounter (Signed)
Is this in reference to the brain scan or the abdominal scan?

## 2015-07-20 NOTE — Telephone Encounter (Signed)
[facility] Jeannene Patella) canceled the MRI. States the MRA will show what needs to be seen unless there was a reason you had requested the MRI. Just an FYI. Thank you

## 2015-07-20 NOTE — Telephone Encounter (Signed)
The abdominal.

## 2015-07-21 MED FILL — DICLOFENAC SODIUM 1% GEL: 1 | 30 days supply | Qty: 1000 | Fill #0

## 2015-07-22 ENCOUNTER — Other Ambulatory Visit: Payer: Self-pay | Admitting: Internal Medicine

## 2015-07-22 DIAGNOSIS — R51 Headache: Principal | ICD-10-CM

## 2015-07-22 DIAGNOSIS — H9319 Tinnitus, unspecified ear: Secondary | ICD-10-CM

## 2015-07-22 DIAGNOSIS — R519 Headache, unspecified: Secondary | ICD-10-CM

## 2015-07-23 ENCOUNTER — Encounter: Payer: Self-pay | Admitting: Internal Medicine

## 2015-07-23 ENCOUNTER — Ambulatory Visit
Admission: RE | Admit: 2015-07-23 | Discharge: 2015-07-23 | Disposition: A | Discharge status: Home / Self Care | Payer: 59 | Source: Ambulatory Visit | Attending: Internal Medicine | Admitting: Internal Medicine

## 2015-07-23 ENCOUNTER — Inpatient Hospital Stay: Admission: RE | Admit: 2015-07-23 | Payer: 59 | Source: Ambulatory Visit

## 2015-07-23 DIAGNOSIS — R519 Headache, unspecified: Secondary | ICD-10-CM

## 2015-07-23 DIAGNOSIS — R51 Headache: Principal | ICD-10-CM

## 2015-07-23 DIAGNOSIS — R202 Paresthesia of skin: Secondary | ICD-10-CM

## 2015-07-23 DIAGNOSIS — R1084 Generalized abdominal pain: Secondary | ICD-10-CM

## 2015-07-23 DIAGNOSIS — I774 Celiac artery compression syndrome: Secondary | ICD-10-CM | POA: Diagnosis not present

## 2015-07-23 DIAGNOSIS — H9313 Tinnitus, bilateral: Secondary | ICD-10-CM

## 2015-07-23 DIAGNOSIS — H9319 Tinnitus, unspecified ear: Secondary | ICD-10-CM

## 2015-07-23 DIAGNOSIS — I728 Aneurysm of other specified arteries: Secondary | ICD-10-CM

## 2015-07-23 MED ORDER — GADOBENATE DIMEGLUMINE 529 MG/ML IV SOLN
20.0000 mL | Freq: Once | INTRAVENOUS | Status: AC | PRN
  Administered 2015-07-23: 20 mL via INTRAVENOUS

## 2015-07-27 ENCOUNTER — Other Ambulatory Visit: Payer: 59

## 2015-08-09 ENCOUNTER — Ambulatory Visit (INDEPENDENT_AMBULATORY_CARE_PROVIDER_SITE_OTHER): Payer: 59 | Admitting: Internal Medicine

## 2015-08-09 ENCOUNTER — Encounter: Payer: Self-pay | Admitting: Internal Medicine

## 2015-08-09 VITALS — BP 120/68 | HR 67 | Temp 98.4°F | Resp 16 | Ht 71.0 in | Wt 290.0 lb

## 2015-08-09 DIAGNOSIS — I10 Essential (primary) hypertension: Secondary | ICD-10-CM

## 2015-08-09 DIAGNOSIS — E038 Other specified hypothyroidism: Secondary | ICD-10-CM | POA: Diagnosis not present

## 2015-08-09 NOTE — Progress Notes (Signed)
Subjective:  Patient ID: Jonathan Powers, male    DOB: 11/16/1950  Age: 65 y.o. MRN: [id number]  CC: Hypothyroidism   HPI Jonathan Powers presents for follow-up on hypothyroidism and to review his recent MRIs. He had an MRI-MRA of the brain and abdomen. MRI of his brain was normal, the abdomen revealed that there is a small kink in his mesenteric artery that does not bother him. He offers no complaints today.  Outpatient Prescriptions Prior to Visit  Medication Sig Dispense Refill  . acetaminophen (TYLENOL) 500 MG tablet Take 1,000 mg by mouth 2 (two) times daily.    [name] albuterol (PROVENTIL HFA;VENTOLIN HFA) 108 (90 Base) MCG/ACT inhaler Inhale 2 puffs into the lungs every 4 (four) hours as needed for wheezing or shortness of breath. 1 Inhaler 0  . cetirizine (ZYRTEC) 10 MG tablet Take 10 mg by mouth daily.    . Cholecalciferol (VITAMIN D-3) 5000 UNITS TABS Take by mouth. Takes Monday thru friday    . diclofenac sodium (VOLTAREN) 1 % GEL Apply 2 g topically 4 (four) times daily as needed (pain).     . Esomeprazole Magnesium (NEXIUM 24HR PO) Take by mouth daily.    . febuxostat (ULORIC) 40 MG tablet Take 40 mg by mouth daily.     [name] levothyroxine (SYNTHROID, LEVOTHROID) 175 MCG tablet Take 1 tablet (175 mcg total) by mouth daily before breakfast. 90 tablet 3  . tadalafil (CIALIS) 5 MG tablet Take 1 tablet (5 mg total) by mouth daily as needed for erectile dysfunction. 18 tablet 0  . Testosterone 20.25 MG/ACT (1.62%) GEL Place 4 Squirts onto the skin daily. 150 g 5  . traZODone (DESYREL) 100 MG tablet Take 0.5-1 tablets (50-100 mg total) by mouth at bedtime as needed for sleep. 90 tablet 3  . valACYclovir (VALTREX) 1000 MG tablet   11  . valsartan (DIOVAN) 80 MG tablet Take 1 tablet (80 mg total) by mouth 2 (two) times daily. Patient takes up to twice a day. 180 tablet 3  . benzonatate (TESSALON) 200 MG capsule Take 1 capsule (200 mg total) by mouth 2 (two) times daily as needed for cough.  (Patient not taking: Reported on 07/12/2015) 40 capsule 1   No facility-administered medications prior to visit.    ROS Review of Systems  Constitutional: Negative for fever, chills, diaphoresis, appetite change and fatigue.  HENT: Negative.   Eyes: Negative.   Respiratory: Negative.  Negative for cough, choking, chest tightness, shortness of breath and stridor.   Cardiovascular: Negative.  Negative for chest pain, palpitations and leg swelling.  Gastrointestinal: Negative.  Negative for nausea, abdominal pain, diarrhea and constipation.  Endocrine: Negative.   Genitourinary: Negative.  Negative for difficulty urinating.  Musculoskeletal: Negative.  Negative for myalgias, back pain and neck pain.  Skin: Negative.  Negative for color change and rash.  Allergic/Immunologic: Negative.   Neurological: Negative.  Negative for dizziness, tremors, weakness, light-headedness, numbness and headaches.  Hematological: Negative.  Negative for adenopathy. Does not bruise/bleed easily.  Psychiatric/Behavioral: Negative.     Objective:  BP 120/68 mmHg  Pulse 67  Temp(Src) 98.4 F (36.9 C) (Oral)  Resp 16  Ht 5\' 11"  (1.803 m)  Wt 290 lb (131.543 kg)  BMI 40.46 kg/m2  SpO2 94%  BP Readings from Last 3 Encounters:  08/09/15 120/68  07/12/15 118/60  06/05/15 155/89    Wt Readings from Last 3 Encounters:  08/09/15 290 lb (131.543 kg)  07/12/15 284 lb (128.822 kg)  01/18/15  278 lb (126.1 kg)    Physical Exam  Constitutional: He is oriented to person, place, and time. No distress.  HENT:  Mouth/Throat: Oropharynx is clear and moist. No oropharyngeal exudate.  Eyes: Conjunctivae are normal. Right eye exhibits no discharge. Left eye exhibits no discharge. No scleral icterus.  Neck: Normal range of motion. Neck supple. No JVD present. No tracheal deviation present. No thyromegaly present.  Cardiovascular: Normal rate, regular rhythm, normal heart sounds and intact distal pulses.  Exam  reveals no gallop and no friction rub.   No murmur heard. Pulmonary/Chest: Effort normal and breath sounds normal. No stridor. No respiratory distress. He has no wheezes. He has no rales. He exhibits no tenderness.  Abdominal: Soft. Bowel sounds are normal. He exhibits no distension and no mass. There is no tenderness. There is no rebound and no guarding.  Musculoskeletal: Normal range of motion. He exhibits no edema or tenderness.  Lymphadenopathy:    He has no cervical adenopathy.  Neurological: He is alert and oriented to person, place, and time. He has normal strength. He displays no atrophy, no tremor and normal reflexes. No cranial nerve deficit or sensory deficit. He exhibits normal muscle tone. He displays a negative Romberg sign. He displays no seizure activity. Coordination and gait normal.  Reflex Scores:      Tricep reflexes are 0 on the right side and 0 on the left side.      Bicep reflexes are 0 on the right side and 0 on the left side.      Brachioradialis reflexes are 0 on the right side and 0 on the left side.      Patellar reflexes are 0 on the right side and 0 on the left side.      Achilles reflexes are 0 on the right side and 0 on the left side. Skin: Skin is warm and dry. No rash noted. He is not diaphoretic. No erythema. No pallor.  Vitals reviewed.   Lab Results  Component Value Date   WBC 6.1 07/12/2015   HGB 16.4 07/12/2015   HCT 47.2 07/12/2015   PLT 299.0 07/12/2015   GLUCOSE 102* 07/12/2015   CHOL 160 01/18/2015   TRIG 153.0* 01/18/2015   HDL 40.00 01/18/2015   LDLCALC 90 01/18/2015   ALT 18 07/12/2015   AST 18 07/12/2015   NA 139 07/12/2015   K 4.4 07/12/2015   CL 104 07/12/2015   CREATININE 0.84 07/12/2015   BUN 17 07/12/2015   CO2 28 07/12/2015   TSH 0.91 07/12/2015   PSA 0.68 01/18/2015   INR 2.4* 07/29/2006   HGBA1C 5.6 09/29/2014    Mr Jodene Nam Head Wo Contrast  07/23/2015  CLINICAL DATA:  Headaches. Tingling in right ear for 3 months. Visual  disturbance and hearing loss. EXAM: MRI HEAD WITHOUT CONTRAST MRA HEAD WITHOUT CONTRAST TECHNIQUE: Multiplanar, multiecho pulse sequences of the brain and surrounding structures were obtained without intravenous contrast. Angiographic images of the head were obtained using MRA technique without contrast. COMPARISON:  MRI brain 02/06/2014 FINDINGS: MRI HEAD FINDINGS No acute infarct, hemorrhage, or mass lesion is present. The ventricles are of normal size. No significant extraaxial fluid collection is present. The internal auditory canals are within normal limits bilaterally. The brainstem and cerebellum are normal. Flow is present in the major intracranial arteries. The globes and orbits are intact. The paranasal sinuses and the mastoid air cells are clear. Skullbase is within normal limits. 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 Midline sagittal  images are otherwise unremarkable. MRA HEAD FINDINGS Tortuosity is noted in the high cervical internal carotid arteries bilaterally. The left A1 segment is slightly dominant to the right. The anterior communicating artery is patent. The an 1 segments are normal bilaterally. MCA bifurcations are within normal limits. The left MCA bifurcation is proximal. ACA and MCA branch vessels are within normal limits bilaterally. Left vertebral artery is the dominant vessel. The PICA origins are visualized and normal. The basilar artery is within normal limits. Both posterior cerebral arteries are within normal limits. IMPRESSION: 1. Normal MRI of the brain. 2. Negative MRA circle of Willis without significant proximal stenosis, aneurysm, or branch vessel occlusion. Electronically Signed   By: San Morelle M.D.   On: 07/23/2015 16:59   Mr Brain Wo Contrast  07/23/2015  CLINICAL DATA:  Headaches. Tingling in right ear for 3 months. Visual disturbance and hearing loss. EXAM: MRI HEAD WITHOUT CONTRAST MRA HEAD WITHOUT CONTRAST TECHNIQUE: Multiplanar, multiecho pulse sequences of the  brain and surrounding structures were obtained without intravenous contrast. Angiographic images of the head were obtained using MRA technique without contrast. COMPARISON:  MRI brain 02/06/2014 FINDINGS: MRI HEAD FINDINGS No acute infarct, hemorrhage, or mass lesion is present. The ventricles are of normal size. No significant extraaxial fluid collection is present. The internal auditory canals are within normal limits bilaterally. The brainstem and cerebellum are normal. Flow is present in the major intracranial arteries. The globes and orbits are intact. The paranasal sinuses and the mastoid air cells are clear. Skullbase is within normal limits. 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 Midline sagittal images are otherwise unremarkable. MRA HEAD FINDINGS Tortuosity is noted in the high cervical internal carotid arteries bilaterally. The left A1 segment is slightly dominant to the right. The anterior communicating artery is patent. The an 1 segments are normal bilaterally. MCA bifurcations are within normal limits. The left MCA bifurcation is proximal. ACA and MCA branch vessels are within normal limits bilaterally. Left vertebral artery is the dominant vessel. The PICA origins are visualized and normal. The basilar artery is within normal limits. Both posterior cerebral arteries are within normal limits. IMPRESSION: 1. Normal MRI of the brain. 2. Negative MRA circle of Willis without significant proximal stenosis, aneurysm, or branch vessel occlusion. Electronically Signed   By: San Morelle M.D.   On: 07/23/2015 16:59   Mr Jodene Nam Abdomen W Wo Contrast  07/23/2015  CLINICAL DATA:  Postprandial abdominal pain. Follow-up of dilatation of the celiac axis. EXAM: MRA ABDOMEN WITH OR WITHOUT CONTRAST TECHNIQUE: Angiographic images of the chest were obtained using MRA technique without and with intravenous contrast. CONTRAST:  49mL MULTIHANCE GADOBENATE DIMEGLUMINE 529 MG/ML IV SOLN COMPARISON:  CT of the abdomen and  pelvis on 07/14/2010 and 11/24/2006 FINDINGS: There is stable dilatation of the trunk of the celiac axis up to a maximal diameter of approximately 13 mm. Based on configuration of the celiac origin, downward slope of the proximal celiac axis and location of the diaphragmatic crura, there likely is a component of chronic median arcuate ligament compression. No high-grade narrowing of the proximal celiac axis is identified. Distal branches are normally patent and showed normal anatomy. The abdominal aorta is normally patent and demonstrates no evidence of aneurysm or stenosis. The superior mesenteric artery, bilateral single renal arteries and inferior mesenteric artery are well depicted and widely patent. Bilateral iliac arteries show some tortuosity but are normally patent. Stable cyst of the anterior lower left kidney measures 3.8 cm in maximum diameter.  This appears to be consistent with a simple cyst. Other visualized solid organs and bowel are unremarkable. No masses or lymphadenopathy identified. No abnormal fluid collections. Visualized bony structures are unremarkable. 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 Maximum diameter is approximately 13 mm. 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 Electronically Signed   By: Aletta Edouard M.D.   On: 07/23/2015 17:34    Assessment & Plan:   Leonitus was seen today for hypothyroidism.  Diagnoses and all orders for this visit:  Essential hypertension- his blood pressure is well-controlled  Other specified hypothyroidism- his TSH is in the normal range, he will remain on the current dose of levothyroxine  I have discontinued Mr. Hipwell's benzonatate. I am also having him maintain his  diclofenac sodium, cetirizine, Esomeprazole Magnesium (NEXIUM 24HR PO), febuxostat, acetaminophen, Vitamin D-3, Testosterone, tadalafil, valACYclovir, valsartan, levothyroxine, traZODone, and albuterol.  No orders of the defined types were placed in this encounter.     Follow-up: Return in about 6 months (around 02/08/2016).  Scarlette Calico, MD

## 2015-08-09 NOTE — Progress Notes (Signed)
Pre visit review using our clinic review tool, if applicable. No additional management support is needed unless otherwise documented below in the visit note. 

## 2015-08-09 NOTE — Patient Instructions (Signed)

## 2015-08-13 DIAGNOSIS — H0011 Chalazion right upper eyelid: Secondary | ICD-10-CM | POA: Diagnosis not present

## 2015-08-13 MED FILL — DOXYCYCLINE HYCLATE 100 MG: 100 | 21 days supply | Qty: 42 | Fill #0

## 2015-08-16 DIAGNOSIS — E291 Testicular hypofunction: Secondary | ICD-10-CM | POA: Diagnosis not present

## 2015-08-18 MED FILL — DICLOFENAC SODIUM 1% GEL: 1 | 30 days supply | Qty: 1000 | Fill #1

## 2015-08-18 MED FILL — CIALIS 5 MG TABLET: 5 | 30 days supply | Qty: 30 | Fill #9

## 2015-08-18 MED FILL — ANDROGEL 1.62% GEL PUMP: 20.25 MG/AC | 30 days supply | Qty: 150 | Fill #3

## 2015-08-18 MED FILL — valACYclovir HCL 1 GM TABS: 1 | 30 days supply | Qty: 20 | Fill #0

## 2015-08-19 DIAGNOSIS — G4733 Obstructive sleep apnea (adult) (pediatric): Secondary | ICD-10-CM | POA: Diagnosis not present

## 2015-09-03 DIAGNOSIS — L0101 Non-bullous impetigo: Secondary | ICD-10-CM | POA: Diagnosis not present

## 2015-09-03 DIAGNOSIS — L738 Other specified follicular disorders: Secondary | ICD-10-CM | POA: Diagnosis not present

## 2015-09-08 MED FILL — CIPROFLOXACIN HCL 500 MG TA: 500 | 10 days supply | Qty: 20 | Fill #0

## 2015-09-17 MED FILL — CIALIS 5 MG TABLET: 5 | 30 days supply | Qty: 30 | Fill #10

## 2015-09-17 MED FILL — valACYclovir HCL 1 GM TABS: 1 | 30 days supply | Qty: 20 | Fill #1

## 2015-09-17 MED FILL — VALSARTAN 80 MG TABLET: 80 | 90 days supply | Qty: 180 | Fill #2

## 2015-09-17 MED FILL — DICLOFENAC SODIUM 1% GEL: 1 | 30 days supply | Qty: 1000 | Fill #2

## 2015-09-17 MED FILL — ANDROGEL 1.62% GEL PUMP: 20.25 MG/AC | 30 days supply | Qty: 150 | Fill #4

## 2015-09-18 DIAGNOSIS — G4733 Obstructive sleep apnea (adult) (pediatric): Secondary | ICD-10-CM | POA: Diagnosis not present

## 2015-09-29 DIAGNOSIS — J029 Acute pharyngitis, unspecified: Secondary | ICD-10-CM | POA: Diagnosis not present

## 2015-09-30 DIAGNOSIS — B999 Unspecified infectious disease: Secondary | ICD-10-CM | POA: Diagnosis not present

## 2015-10-01 ENCOUNTER — Encounter: Payer: Self-pay | Admitting: Family

## 2015-10-01 ENCOUNTER — Ambulatory Visit (INDEPENDENT_AMBULATORY_CARE_PROVIDER_SITE_OTHER): Payer: 59 | Admitting: Family

## 2015-10-01 DIAGNOSIS — J0100 Acute maxillary sinusitis, unspecified: Secondary | ICD-10-CM

## 2015-10-01 NOTE — Progress Notes (Signed)
Subjective:    Patient ID: Jonathan Powers, male    DOB: April 01, 1950, 65 y.o.   MRN: [id number]  Chief Complaint  Patient presents with  . Sore Throat    sore throat and productive cough, finishing up a zpak and cipro for rash on leg    HPI:  NOUH Powers is a 65 y.o. male who  has a past medical history of ALLERGIC RHINITIS; CELLULITIS, LEG, RIGHT; Diverticulitis; GERD (gastroesophageal reflux disease); Gout; Hashimoto's thyroiditis; Hypertension; Hypogonadism male; Hypothyroid; Left thyroid nodule (2008); Migraines; OSTEOARTHRITIS; and Seasonal allergies. and presents today for an acute office visit.                                                                                                                 This is a new problem. Associated symptom of productive cough and sore throat has been going on for about 4 days .Cough is described as productive with purulent sputum. Modifying factors include a course of azithromycin which has not helped very much and has also working on ciprofloxacin for a rash. Course of the symptoms has stayed about the same. Has had chest x-ray and strep cultures done which were both negative.   Allergies  Allergen Reactions  . Nsaids Shortness Of Breath    Naproxen specifically causes SOB/wheeze tolerates topical diclofenac without problem Tylenol OK  . Penicillins Rash     Current Outpatient Prescriptions on File Prior to Visit  Medication Sig Dispense Refill  . acetaminophen (TYLENOL) 500 MG tablet Take 1,000 mg by mouth 2 (two) times daily.    [name] albuterol (PROVENTIL HFA;VENTOLIN HFA) 108 (90 Base) MCG/ACT inhaler Inhale 2 puffs into the lungs every 4 (four) hours as needed for wheezing or shortness of breath. 1 Inhaler 0  . cetirizine (ZYRTEC) 10 MG tablet Take 10 mg by mouth daily.    . Cholecalciferol (VITAMIN D-3) 5000 UNITS TABS Take by mouth. Takes Monday thru friday    . diclofenac sodium (VOLTAREN) 1 % GEL Apply 2 g topically 4 (four)  times daily as needed (pain).     . Esomeprazole Magnesium (NEXIUM 24HR PO) Take by mouth daily.    . febuxostat (ULORIC) 40 MG tablet Take 40 mg by mouth daily.     [name] levothyroxine (SYNTHROID, LEVOTHROID) 175 MCG tablet Take 1 tablet (175 mcg total) by mouth daily before breakfast. 90 tablet 3  . tadalafil (CIALIS) 5 MG tablet Take 1 tablet (5 mg total) by mouth daily as needed for erectile dysfunction. 18 tablet 0  . Testosterone 20.25 MG/ACT (1.62%) GEL Place 4 Squirts onto the skin daily. 150 g 5  . traZODone (DESYREL) 100 MG tablet Take 0.5-1 tablets (50-100 mg total) by mouth at bedtime as needed for sleep. 90 tablet 3  . valACYclovir (VALTREX) 1000 MG tablet   11  . valsartan (DIOVAN) 80 MG tablet Take 1 tablet (80 mg total) by mouth 2 (two) times daily. Patient takes up to twice a day. 180 tablet 3   No  current facility-administered medications on file prior to visit.     Review of Systems  Constitutional: Negative for chills and fever.  HENT: Positive for congestion, sinus pressure and sore throat.   Respiratory: Positive for cough. Negative for chest tightness and shortness of breath.   Neurological: Positive for headaches.      Objective:    BP 134/84 (BP Location: Left Arm, Patient Position: Sitting, Cuff Size: Large)   Pulse 63   Temp 98.2 F (36.8 C) (Oral)   Resp 16   Ht 5\' 11"  (1.803 m)   Wt 279 lb (126.6 kg)   SpO2 96%   BMI 38.91 kg/m  Nursing note and vital signs reviewed.  Physical Exam  Constitutional: He is oriented to person, place, and time. He appears well-developed and well-nourished.  HENT:  Right Ear: Hearing, tympanic membrane, external ear and ear canal normal.  Left Ear: Hearing, tympanic membrane, external ear and ear canal normal.  Nose: Right sinus exhibits maxillary sinus tenderness. Right sinus exhibits no frontal sinus tenderness. Left sinus exhibits maxillary sinus tenderness. Left sinus exhibits no frontal sinus tenderness.  Mouth/Throat:  Uvula is midline, oropharynx is clear and moist and mucous membranes are normal.  Neck: Neck supple.  Cardiovascular: Normal rate, regular rhythm, normal heart sounds and intact distal pulses.   Pulmonary/Chest: Effort normal and breath sounds normal.  Neurological: He is alert and oriented to person, place, and time.  Skin: Skin is warm and dry.       Assessment & Plan:   Problem List Items Addressed This Visit      Respiratory   Sinusitis, acute maxillary    Symptoms and exam consistent with sinusitis with previous x-rays and strep cultures being negative. Continue current dosage of Ciprofloxacin which should cover for sinus infection as well. Continue over the counter medications as needed for symptom relief and supportive care. Follow up if symptoms worsen or do not improve.        Other Visit Diagnoses   None.      I am having Mr. [name] maintain his diclofenac sodium, cetirizine, Esomeprazole Magnesium (NEXIUM 24HR PO), febuxostat, acetaminophen, Vitamin D-3, Testosterone, tadalafil, valACYclovir, valsartan, levothyroxine, traZODone, and albuterol.   Follow-up: Return if symptoms worsen or fail to improve.  [name], FNP

## 2015-10-01 NOTE — Patient Instructions (Signed)
Thank you for choosing [facility].  Summary/Instructions:  Please continue to take your Ciprofloxacin which can cover for your infection.   Continue over the counter medications as needed.  If your symptoms worsen or fail to improve, please contact our office for further instruction, or in case of emergency go directly to the emergency room at the closest medical facility.   General Recommendations:    Please drink plenty of fluids.  Get plenty of rest   Sleep in humidified air  Use saline nasal sprays  Netti pot   OTC Medications:  Decongestants - helps relieve congestion   Flonase (generic fluticasone) or Nasacort (generic triamcinolone) - please make sure to use the "cross-over" technique at a 45 degree angle towards the opposite eye as opposed to straight up the nasal passageway.   Sudafed (generic pseudoephedrine - Note this is the one that is available behind the pharmacy counter); Products with phenylephrine (-PE) may also be used but is often not as effective as pseudoephedrine.   If you have HIGH BLOOD PRESSURE - Coricidin HBP; AVOID any product that is -D as this contains pseudoephedrine which may increase your blood pressure.  Afrin (oxymetazoline) every 6-8 hours for up to 3 days.   Allergies - helps relieve runny nose, itchy eyes and sneezing   Claritin (generic loratidine), Allegra (fexofenidine), or Zyrtec (generic cyrterizine) for runny nose. These medications should not cause drowsiness.  Note - Benadryl (generic diphenhydramine) may be used however may cause drowsiness  Cough -   Delsym or Robitussin (generic dextromethorphan)  Expectorants - helps loosen mucus to ease removal   Mucinex (generic guaifenesin) as directed on the package.  Headaches / General Aches   Tylenol (generic acetaminophen) - DO NOT EXCEED 3 grams (3,000 mg) in a 24 hour time period  Sore Throat -   Salt water gargle   Chloraseptic (generic benzocaine)  spray or lozenges / Sucrets (generic dyclonine)

## 2015-10-01 NOTE — Assessment & Plan Note (Signed)
Symptoms and exam consistent with sinusitis with previous x-rays and strep cultures being negative. Continue current dosage of Ciprofloxacin which should cover for sinus infection as well. Continue over the counter medications as needed for symptom relief and supportive care. Follow up if symptoms worsen or do not improve.

## 2015-10-18 DIAGNOSIS — G4733 Obstructive sleep apnea (adult) (pediatric): Secondary | ICD-10-CM | POA: Diagnosis not present

## 2015-10-18 MED FILL — ANDROGEL 1.62% GEL PUMP: 20.25 MG/AC | 30 days supply | Qty: 150 | Fill #5

## 2015-10-18 MED FILL — LEVOTHYROXINE 175 MCG TAB: 175 | 90 days supply | Qty: 90 | Fill #2

## 2015-10-18 MED FILL — DICLOFENAC SODIUM 1% GEL: 1 | 30 days supply | Qty: 1000 | Fill #3

## 2015-10-18 MED FILL — CIALIS 5 MG TABLET: 5 | 30 days supply | Qty: 30 | Fill #11

## 2015-10-18 MED FILL — traZODone HCL 100 MG TABS: 100 | 90 days supply | Qty: 90 | Fill #2

## 2015-10-26 MED FILL — ULORIC 80 MG TABLET: 80 | 90 days supply | Qty: 90 | Fill #0

## 2015-11-01 ENCOUNTER — Ambulatory Visit (INDEPENDENT_AMBULATORY_CARE_PROVIDER_SITE_OTHER): Payer: 59 | Admitting: Pulmonary Disease

## 2015-11-01 ENCOUNTER — Ambulatory Visit (INDEPENDENT_AMBULATORY_CARE_PROVIDER_SITE_OTHER)
Admission: RE | Admit: 2015-11-01 | Discharge: 2015-11-01 | Disposition: A | Discharge status: Home / Self Care | Payer: 59 | Source: Ambulatory Visit | Attending: Pulmonary Disease | Admitting: Pulmonary Disease

## 2015-11-01 ENCOUNTER — Encounter: Payer: Self-pay | Admitting: Pulmonary Disease

## 2015-11-01 VITALS — BP 128/74 | HR 66 | Ht 71.0 in | Wt 284.0 lb

## 2015-11-01 DIAGNOSIS — R05 Cough: Secondary | ICD-10-CM

## 2015-11-01 DIAGNOSIS — G4733 Obstructive sleep apnea (adult) (pediatric): Secondary | ICD-10-CM

## 2015-11-01 DIAGNOSIS — R059 Cough, unspecified: Secondary | ICD-10-CM

## 2015-11-01 NOTE — Assessment & Plan Note (Signed)
He has been experiencing a persistent cough for the last 8 or 9 months. This has been associated with some postnasal drip which persists despite treatment for allergic rhinitis. He says that his acid reflux symptoms are well controlled.  Today on exam his lungs are clear to auscultation. I personally reviewed the images from his chest x-ray from November 2016 which showed no evidence of lung disease and I we also looked at multiple CT scans of his chest together today from about 8 or 9 years ago and this did not show significant lung disease either.  Siddharth for me to say disease, lung problem is causing his cough. I think the most likely explanation is postnasal drip leading to irritable larynx.  Plan: Treat allergic rhinitis by using nasal steroids appropriately, we reviewed the basic pharmacokinetics today Continue Zyrtec Voice rest was strongly encouraged Chest x-ray PFT If no improvement in the next 3-4 weeks then consider gastroesophageal pH probe

## 2015-11-01 NOTE — Patient Instructions (Signed)
You need to try to suppress your cough to allow your larynx (voice box) to heal.  For three days don't talk, laugh, sing, or clear your throat. Do everything you can to suppress the cough during this time. Use hard candies (sugarless Jolly Ranchers) or non-mint or non-menthol containing cough drops during this time to soothe your throat.  Use a cough suppressant (Delsym or tessalon) around the clock during this time.  After three days, gradually increase the use of your voice and back off on the cough suppressants.  For the allergic rhinitis: Use Neil Med rinses with distilled water at least twice per day using the instructions on the package. 1/2 hour after using the [facility] Med rinse, use Nasacort (or a generic nasal steroid) two puffs in each nostril once per day.  Remember that the Nasacort can take 1-2 weeks to work after regular use. Use generic zyrtec (cetirizine) every day.  If this doesn't help, then stop taking it and use chlorpheniramine-phenylephrine combination tablets.  We will call you with the results of the CXR and PFT and see you back in 3-4 weeks to make sure you are getting better

## 2015-11-01 NOTE — Assessment & Plan Note (Signed)
He has severe sleep apnea but he reports compliance with therapy. He says he has been having some fatigue during the daytime.  Plan: Request a download

## 2015-11-01 NOTE — Progress Notes (Signed)
Subjective:    Patient ID: Jonathan Powers, male    DOB: May 26, 1950, 65 y.o.   MRN: [id number]  HPI Chief Complaint  Patient presents with  . Advice Only    Former [facility] pt to re-establish care.  Pt was being treated for OSA, c/o chronic cough also.    Jonathan Powers has come back to our clinic today for evaluation of obstructive sleep apnea and his chronic cough.  He had a home sleep study and was prescribed CPAP.  He has been using the CPAP machine nightly.  He has been having trouble getting his headgear straps for his CPAP mask.    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 > he had a recent infection of his leg which showed klebsiella and pseudomonas > he has a dry hacky cough which is better with a water > spells will flare up > rare mucus > He notes continued postnasal drip despite taking Zyrtec, he only uses Flonase as needed, he uses Afrin from time to time  He says that his acid reflux is well controlled.  As a child he had bronchitis a lot.    Had pneumonia when he was in practice around age 9.  He was treated as an outpatient. He went to urgent care and was diagnosed with the flu a few months back, he had a fever and body aches.  He was treated   He smoked for one month in his life.    Past Medical History:  Diagnosis Date  . ALLERGIC RHINITIS   . CELLULITIS, LEG, RIGHT    Recurrent R hip cellulitis 1/08,3/08   . Diverticulitis   . GERD (gastroesophageal reflux disease)   . Gout   . Hashimoto's thyroiditis   . Hypertension   . Hypogonadism male    low T, a/w ED  . Hypothyroid   . Left thyroid nodule 2008   on [place], decrease size in 2011 [place]  . Migraines    Atypical and ocular  . OSTEOARTHRITIS   . Seasonal allergies      Family History  Problem Relation Age of Onset  . Arthritis Father   . Heart disease Father   . Diabetes Father   . Vascular Disease Father     AoBifem  . Arthritis Mother   .  Diabetes Mother   . Multiple sclerosis Mother   . Cancer Paternal Grandmother     "GI"  . Cancer Paternal Grandfather     stomach cancer  . Thyroid cancer Sister   . Uterine cancer Sister      Social History   Social History  . Marital status: Married    Spouse name: N/A  . Number of children: N/A  . Years of education: N/A   Occupational History  . 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 Smoking status: Never Smoker  . Smokeless tobacco: Never Used  . Alcohol use 0.0 oz/week     Comment: rare  . Drug use: No  . Sexual activity: Not on file   Other Topics Concern  . Not on file   Social History Narrative   ortho PA, married, lives with spouse     Allergies  Allergen Reactions  . Nsaids Shortness Of Breath    Naproxen specifically causes SOB/wheeze tolerates topical diclofenac without problem Tylenol OK  . Penicillins Rash     Outpatient Medications Prior to  Visit  Medication Sig Dispense Refill  . acetaminophen (TYLENOL) 500 MG tablet Take 1,000 mg by mouth 2 (two) times daily.    [name] albuterol (PROVENTIL HFA;VENTOLIN HFA) 108 (90 Base) MCG/ACT inhaler Inhale 2 puffs into the lungs every 4 (four) hours as needed for wheezing or shortness of breath. 1 Inhaler 0  . cetirizine (ZYRTEC) 10 MG tablet Take 10 mg by mouth daily.    . Cholecalciferol (VITAMIN D-3) 5000 UNITS TABS Take by mouth. Takes Monday thru friday    . diclofenac sodium (VOLTAREN) 1 % GEL Apply 2 g topically 4 (four) times daily as needed (pain).     . Esomeprazole Magnesium (NEXIUM 24HR PO) Take 1 tablet by mouth daily as needed.     . febuxostat (ULORIC) 40 MG tablet Take 40 mg by mouth daily.     [name] levothyroxine (SYNTHROID, LEVOTHROID) 175 MCG tablet Take 1 tablet (175 mcg total) by mouth daily before breakfast. 90 tablet 3  . tadalafil (CIALIS) 5 MG tablet Take 1 tablet (5 mg total) by mouth daily as needed for erectile dysfunction. 18 tablet 0  . Testosterone 20.25 MG/ACT  (1.62%) GEL Place 4 Squirts onto the skin daily. 150 g 5  . traZODone (DESYREL) 100 MG tablet Take 0.5-1 tablets (50-100 mg total) by mouth at bedtime as needed for sleep. 90 tablet 3  . valACYclovir (VALTREX) 1000 MG tablet   11  . valsartan (DIOVAN) 80 MG tablet Take 1 tablet (80 mg total) by mouth 2 (two) times daily. Patient takes up to twice a day. 180 tablet 3   No facility-administered medications prior to visit.       Review of Systems  Constitutional: Negative for fever and unexpected weight change.  HENT: Positive for congestion. Negative for dental problem, ear pain, nosebleeds, postnasal drip, rhinorrhea, sinus pressure, sneezing, sore throat and trouble swallowing.   Eyes: Negative for redness and itching.  Respiratory: Positive for cough and shortness of breath. Negative for chest tightness and wheezing.   Cardiovascular: Negative for palpitations and leg swelling.  Gastrointestinal: Negative for nausea and vomiting.  Genitourinary: Negative for dysuria.  Musculoskeletal: Negative for joint swelling.  Skin: Negative for rash.  Neurological: Negative for headaches.  Hematological: Does not bruise/bleed easily.  Psychiatric/Behavioral: Negative for dysphoric mood. The patient is not nervous/anxious.        Objective:   Physical Exam Vitals:   11/01/15 1604  BP: 128/74  Pulse: 66  SpO2: 97%  Weight: 284 lb (128.8 kg)  Height: 5\' 11"  (1.803 m)   RA  Gen: obese but well appearing, no acute distress HENT: NCAT, OP clear, neck supple without masses Eyes: PERRL, EOMi Lymph: no cervical lymphadenopathy PULM: CTA B CV: RRR, no mgr, no JVD GI: BS+, soft, nontender, no hsm Derm: no rash or skin breakdown MSK: normal bulk and tone Neuro: A&Ox4, CN II-XII intact, strength 5/5 in all 4 extremities Psyche: normal mood and affect  Records from my partner reviewed were he was followed for obstructive sleep apnea  CT and chest x-ray images reviewed, see below  Recent  MRI brain showed no evidence of sinus disease.      Assessment & Plan:  Cough He has been experiencing a persistent cough for the last 8 or 9 months. This has been associated with some postnasal drip which persists despite treatment for allergic rhinitis. He says that his acid reflux symptoms are well controlled.  Today on exam his lungs are clear to auscultation. I personally reviewed  the images from his chest x-ray from November 2016 which showed no evidence of lung disease and I we also looked at multiple CT scans of his chest together today from about 8 or 9 years ago and this did not show significant lung disease either.  Siddharth for me to say disease, lung problem is causing his cough. I think the most likely explanation is postnasal drip leading to irritable larynx.  Plan: Treat allergic rhinitis by using nasal steroids appropriately, we reviewed the basic pharmacokinetics today Continue Zyrtec Voice rest was strongly encouraged Chest x-ray PFT If no improvement in the next 3-4 weeks then consider gastroesophageal pH probe  OSA (obstructive sleep apnea) He has severe sleep apnea but he reports compliance with therapy. He says he has been having some fatigue during the daytime.  Plan: Request a download    Current Outpatient Prescriptions:  .  acetaminophen (TYLENOL) 500 MG tablet, Take 1,000 mg by mouth 2 (two) times daily., Disp: , Rfl:  .  albuterol (PROVENTIL HFA;VENTOLIN HFA) 108 (90 Base) MCG/ACT inhaler, Inhale 2 puffs into the lungs every 4 (four) hours as needed for wheezing or shortness of breath., Disp: 1 Inhaler, Rfl: 0 .  cetirizine (ZYRTEC) 10 MG tablet, Take 10 mg by mouth daily., Disp: , Rfl:  .  Cholecalciferol (VITAMIN D-3) 5000 UNITS TABS, Take by mouth. Takes Monday thru friday, Disp: , Rfl:  .  diclofenac sodium (VOLTAREN) 1 % GEL, Apply 2 g topically 4 (four) times daily as needed (pain). , Disp: , Rfl:  .  Esomeprazole Magnesium (NEXIUM 24HR PO), Take  1 tablet by mouth daily as needed. , Disp: , Rfl:  .  febuxostat (ULORIC) 40 MG tablet, Take 40 mg by mouth daily. , Disp: , Rfl:  .  levothyroxine (SYNTHROID, LEVOTHROID) 175 MCG tablet, Take 1 tablet (175 mcg total) by mouth daily before breakfast., Disp: 90 tablet, Rfl: 3 .  tadalafil (CIALIS) 5 MG tablet, Take 1 tablet (5 mg total) by mouth daily as needed for erectile dysfunction., Disp: 18 tablet, Rfl: 0 .  Testosterone 20.25 MG/ACT (1.62%) GEL, Place 4 Squirts onto the skin daily., Disp: 150 g, Rfl: 5 .  traZODone (DESYREL) 100 MG tablet, Take 0.5-1 tablets (50-100 mg total) by mouth at bedtime as needed for sleep., Disp: 90 tablet, Rfl: 3 .  valACYclovir (VALTREX) 1000 MG tablet, , Disp: , Rfl: 11 .  valsartan (DIOVAN) 80 MG tablet, Take 1 tablet (80 mg total) by mouth 2 (two) times daily. Patient takes up to twice a day., Disp: 180 tablet, Rfl: 3

## 2015-11-15 ENCOUNTER — Encounter: Payer: Self-pay | Admitting: Pulmonary Disease

## 2015-11-17 DIAGNOSIS — G4733 Obstructive sleep apnea (adult) (pediatric): Secondary | ICD-10-CM | POA: Diagnosis not present

## 2015-11-17 MED FILL — DICLOFENAC SODIUM 1% GEL: 1 | 30 days supply | Qty: 1000 | Fill #4

## 2015-11-17 MED FILL — valACYclovir HCL 1 GM TABS: 1 | 30 days supply | Qty: 20 | Fill #2

## 2015-11-18 MED FILL — CIALIS 5 MG TABLET: 5 | 30 days supply | Qty: 30 | Fill #0

## 2015-11-19 DIAGNOSIS — E291 Testicular hypofunction: Secondary | ICD-10-CM | POA: Diagnosis not present

## 2015-11-19 DIAGNOSIS — Z125 Encounter for screening for malignant neoplasm of prostate: Secondary | ICD-10-CM | POA: Diagnosis not present

## 2015-11-19 LAB — PSA: PSA: 0.68

## 2015-11-19 MED FILL — ANDROGEL 1.62% GEL PUMP: 20.25 MG/AC | 30 days supply | Qty: 150 | Fill #0

## 2015-11-22 MED FILL — predniSONE 5 MG TABS: 5 | 12 days supply | Qty: 48 | Fill #0

## 2015-11-22 MED FILL — COLCHICINE 0.6 MG TABLET: 0.6 | 45 days supply | Qty: 90 | Fill #0

## 2015-11-26 ENCOUNTER — Encounter: Payer: Self-pay | Admitting: Adult Health

## 2015-11-26 ENCOUNTER — Ambulatory Visit (INDEPENDENT_AMBULATORY_CARE_PROVIDER_SITE_OTHER): Payer: 59 | Admitting: Adult Health

## 2015-11-26 VITALS — BP 126/72 | HR 75 | Ht 70.0 in | Wt 283.4 lb

## 2015-11-26 DIAGNOSIS — R05 Cough: Secondary | ICD-10-CM | POA: Diagnosis not present

## 2015-11-26 DIAGNOSIS — R059 Cough, unspecified: Secondary | ICD-10-CM

## 2015-11-26 DIAGNOSIS — E291 Testicular hypofunction: Secondary | ICD-10-CM | POA: Diagnosis not present

## 2015-11-26 DIAGNOSIS — G4733 Obstructive sleep apnea (adult) (pediatric): Secondary | ICD-10-CM

## 2015-11-26 DIAGNOSIS — E785 Hyperlipidemia, unspecified: Secondary | ICD-10-CM | POA: Diagnosis not present

## 2015-11-26 DIAGNOSIS — N5201 Erectile dysfunction due to arterial insufficiency: Secondary | ICD-10-CM | POA: Diagnosis not present

## 2015-11-26 NOTE — Progress Notes (Signed)
Subjective:    Patient ID: Jonathan Powers, male    DOB: 05/15/1950, 65 y.o.   MRN: [id number]  HPI 65 yo male followed for OSA on CPAP and Chronic cough  He is a PA with Ortho   11/26/2015 Follow up ; OSA /cough  Pt returns for 1 month follow up . Last ov with upper airway cough felt secondary to post nasal drip . He was treated with nasal steroids , zytec , GERD tx . He says cough has resolved totally.  Denies fever, chest pain, edema .  Has Severe OSA . Doing well on CPAP .  Download shows excellent compliance , AHI 0.3, 7 hr usage. Feels rested .    Past Medical History:  Diagnosis Date  . ALLERGIC RHINITIS   . CELLULITIS, LEG, RIGHT    Recurrent R hip cellulitis 1/08,3/08   . Diverticulitis   . GERD (gastroesophageal reflux disease)   . Gout   . Hashimoto's thyroiditis   . Hypertension   . Hypogonadism male    low T, a/w ED  . Hypothyroid   . Left thyroid nodule 2008   on [place], decrease size in 2011 [place]  . Migraines    Atypical and ocular  . OSTEOARTHRITIS   . Seasonal allergies    Current Outpatient Prescriptions on File Prior to Visit  Medication Sig Dispense Refill  . acetaminophen (TYLENOL) 500 MG tablet Take 1,000 mg by mouth 2 (two) times daily.    [name] albuterol (PROVENTIL HFA;VENTOLIN HFA) 108 (90 Base) MCG/ACT inhaler Inhale 2 puffs into the lungs every 4 (four) hours as needed for wheezing or shortness of breath. 1 Inhaler 0  . cetirizine (ZYRTEC) 10 MG tablet Take 10 mg by mouth daily.    . Cholecalciferol (VITAMIN D-3) 5000 UNITS TABS Take by mouth. Takes Monday thru friday    . diclofenac sodium (VOLTAREN) 1 % GEL Apply 2 g topically 4 (four) times daily as needed (pain).     . Esomeprazole Magnesium (NEXIUM 24HR PO) Take 1 tablet by mouth daily as needed.     . febuxostat (ULORIC) 40 MG tablet Take 40 mg by mouth daily.     [name] levothyroxine (SYNTHROID, LEVOTHROID) 175 MCG tablet Take 1 tablet (175 mcg total) by mouth daily before breakfast. 90 tablet 3  .  tadalafil (CIALIS) 5 MG tablet Take 1 tablet (5 mg total) by mouth daily as needed for erectile dysfunction. 18 tablet 0  . Testosterone 20.25 MG/ACT (1.62%) GEL Place 4 Squirts onto the skin daily. 150 g 5  . traZODone (DESYREL) 100 MG tablet Take 0.5-1 tablets (50-100 mg total) by mouth at bedtime as needed for sleep. 90 tablet 3  . valsartan (DIOVAN) 80 MG tablet Take 1 tablet (80 mg total) by mouth 2 (two) times daily. Patient takes up to twice a day. 180 tablet 3  . valACYclovir (VALTREX) 1000 MG tablet   11   No current facility-administered medications on file prior to visit.      Review of Systems Constitutional:   No  weight loss, night sweats,  Fevers, chills, fatigue, or  lassitude.  HEENT:   No headaches,  Difficulty swallowing,  Tooth/dental problems, or  Sore throat,                No sneezing, itching, ear ache, nasal congestion, post nasal drip,   CV:  No chest pain,  Orthopnea, PND, swelling in lower extremities, anasarca, dizziness, palpitations, syncope.   GI  No  heartburn, indigestion, abdominal pain, nausea, vomiting, diarrhea, change in bowel habits, loss of appetite, bloody stools.   Resp: No shortness of breath with exertion or at rest.  No excess mucus, no productive cough,  No non-productive cough,  No coughing up of blood.  No change in color of mucus.  No wheezing.  No chest wall deformity  Skin: no rash or lesions.  GU: no dysuria, change in color of urine, no urgency or frequency.  No flank pain, no hematuria   MS:  No joint pain or swelling.  No decreased range of motion.  No back pain.  Psych:  No change in mood or affect. No depression or anxiety.  No memory loss.         Objective:   Physical Exam  Vitals:   11/26/15 1650  BP: 126/72  Pulse: 75  SpO2: 96%  Weight: 283 lb 6.4 oz (128.5 kg)  Height: 5\' 10"  (1.778 m)  Body mass index is 40.66 kg/m.   GEN: A/Ox3; pleasant , NAD, well nourished    HEENT:  [place]/AT,  EACs-clear, TMs-wnl,  NOSE-clear, THROAT-clear, no lesions, no postnasal drip or exudate noted. Class 2-3 MP airway   NECK:  Supple w/ fair ROM; no JVD; normal carotid impulses w/o bruits; no thyromegaly or nodules palpated; no lymphadenopathy.    RESP  Clear  P & A; w/o, wheezes/ rales/ or rhonchi. no accessory muscle use, no dullness to percussion  CARD:  RRR, no m/r/g  , no peripheral edema, pulses intact, no cyanosis or clubbing.  GI:   Soft & nt; nml bowel sounds; no organomegaly or masses detected.   Musco: Warm bil, no deformities or joint swelling noted.   Neuro: alert, no focal deficits noted.    Skin: Warm, no lesions or rashes  Riot Barrick NP-C  [facility]  11/26/2015       Assessment & Plan:

## 2015-11-26 NOTE — Patient Instructions (Signed)
Glad your cough is better.  Continue on CPAP At bedtime   Work on weight loss.  Do not drive if sleepy  follow up Dr. Lake Bells in 1 year and As needed

## 2015-11-26 NOTE — Assessment & Plan Note (Signed)
Resolved

## 2015-11-26 NOTE — Assessment & Plan Note (Signed)
Well controlled on cpa p

## 2015-11-29 ENCOUNTER — Encounter: Payer: Self-pay | Admitting: Internal Medicine

## 2015-12-02 NOTE — Assessment & Plan Note (Signed)
Well controlled on CPAP  No changes

## 2015-12-17 DIAGNOSIS — G4733 Obstructive sleep apnea (adult) (pediatric): Secondary | ICD-10-CM | POA: Diagnosis not present

## 2015-12-17 MED FILL — DICLOFENAC SODIUM 1% GEL: 1 | 30 days supply | Qty: 1000 | Fill #5

## 2015-12-17 MED FILL — VALSARTAN 80 MG TABLET: 80 | 90 days supply | Qty: 180 | Fill #3

## 2015-12-17 MED FILL — CIALIS 5 MG TABLET: 5 | 30 days supply | Qty: 30 | Fill #1

## 2015-12-21 MED FILL — TESTOSTERONE 30 MG/1.5 ML P: 30 | 30 days supply | Qty: 90 | Fill #0

## 2016-01-10 DIAGNOSIS — H52223 Regular astigmatism, bilateral: Secondary | ICD-10-CM | POA: Diagnosis not present

## 2016-01-10 DIAGNOSIS — H5213 Myopia, bilateral: Secondary | ICD-10-CM | POA: Diagnosis not present

## 2016-01-10 DIAGNOSIS — H2513 Age-related nuclear cataract, bilateral: Secondary | ICD-10-CM | POA: Diagnosis not present

## 2016-01-10 DIAGNOSIS — H524 Presbyopia: Secondary | ICD-10-CM | POA: Diagnosis not present

## 2016-01-17 DIAGNOSIS — G4733 Obstructive sleep apnea (adult) (pediatric): Secondary | ICD-10-CM | POA: Diagnosis not present

## 2016-01-18 MED FILL — TESTOSTERONE 30 MG/1.5 ML P: 30 | 30 days supply | Qty: 90 | Fill #1

## 2016-01-18 MED FILL — DICLOFENAC SODIUM 1% GEL: 1 | 30 days supply | Qty: 1000 | Fill #6

## 2016-01-18 MED FILL — CIALIS 5 MG TABLET: 5 | 30 days supply | Qty: 30 | Fill #2

## 2016-01-18 MED FILL — ULORIC 80 MG TABLET: 80 | 90 days supply | Qty: 90 | Fill #1

## 2016-01-19 ENCOUNTER — Other Ambulatory Visit: Payer: Self-pay | Admitting: Internal Medicine

## 2016-01-19 MED FILL — LEVOTHYROXINE 175 MCG TAB: 175 | 30 days supply | Qty: 30 | Fill #0

## 2016-01-24 ENCOUNTER — Other Ambulatory Visit: Payer: Self-pay | Admitting: Internal Medicine

## 2016-01-24 MED FILL — traZODone HCL 100 MG TABS: 100 | 90 days supply | Qty: 90 | Fill #0

## 2016-02-07 ENCOUNTER — Other Ambulatory Visit (INDEPENDENT_AMBULATORY_CARE_PROVIDER_SITE_OTHER): Payer: 59

## 2016-02-07 ENCOUNTER — Ambulatory Visit (INDEPENDENT_AMBULATORY_CARE_PROVIDER_SITE_OTHER): Payer: 59 | Admitting: Internal Medicine

## 2016-02-07 ENCOUNTER — Encounter: Payer: Self-pay | Admitting: Internal Medicine

## 2016-02-07 VITALS — BP 116/58 | HR 61 | Temp 98.4°F | Resp 16 | Ht 70.0 in | Wt 286.0 lb

## 2016-02-07 DIAGNOSIS — J0100 Acute maxillary sinusitis, unspecified: Secondary | ICD-10-CM

## 2016-02-07 DIAGNOSIS — E038 Other specified hypothyroidism: Secondary | ICD-10-CM | POA: Diagnosis not present

## 2016-02-07 DIAGNOSIS — E041 Nontoxic single thyroid nodule: Secondary | ICD-10-CM | POA: Diagnosis not present

## 2016-02-07 LAB — LIPID PANEL
CHOL/HDL RATIO: 3
CHOLESTEROL: 156 mg/dL (ref 0–200)
HDL: 44.9 mg/dL (ref >39.00)
LDL CALC: 83 mg/dL (ref 0–99)
NonHDL: 111.13
Triglycerides: 140 mg/dL (ref 0.0–149.0)
VLDL: 28 mg/dL (ref 0.0–40.0)

## 2016-02-07 LAB — THYROID PANEL WITH TSH
Free Thyroxine Index: 3.5 (ref 1.4–3.8)
T3 Uptake: 35 % (ref 22–35)
T4 TOTAL: 10 ug/dL (ref 4.5–12.0)
TSH: 0.5 mIU/L (ref 0.40–4.50)

## 2016-02-07 MED ORDER — CEFDINIR 300 MG PO CAPS: 300.0000 mg | ORAL_CAPSULE | Freq: Two times a day (BID) | ORAL | 1 refills | Status: DC

## 2016-02-07 MED ORDER — LEVOTHYROXINE SODIUM 175 MCG PO TABS: 175.0000 ug | ORAL_TABLET | Freq: Every day | ORAL | 1 refills | Status: DC

## 2016-02-07 MED FILL — CEFDINIR 300 MG CAPSULE: 300 | 10 days supply | Qty: 20 | Fill #0

## 2016-02-07 NOTE — Progress Notes (Signed)
Subjective:  Patient ID: Jonathan Powers, male    DOB: September 27, 1950  Age: 64 y.o. MRN: [id number]  CC: Hypothyroidism and Sinusitis   HPI Jonathan Powers presents for a follow-up on hypothyroidism and thyroid nodule and a 2 week history of thick, yellow-green phlegm from his nose, with nasal congestion, postnasal strip drip and mild sore throat. He denies cough, fever, chills, or night sweats. He complains of fatigue but denies edema, weight gain, neck pain, or nodules in his thyroid gland.  Outpatient Medications Prior to Visit  Medication Sig Dispense Refill  . acetaminophen (TYLENOL) 500 MG tablet Take 1,000 mg by mouth 2 (two) times daily.    [name] albuterol (PROVENTIL HFA;VENTOLIN HFA) 108 (90 Base) MCG/ACT inhaler Inhale 2 puffs into the lungs every 4 (four) hours as needed for wheezing or shortness of breath. 1 Inhaler 0  . cetirizine (ZYRTEC) 10 MG tablet Take 10 mg by mouth daily.    . Cholecalciferol (VITAMIN D-3) 5000 UNITS TABS Take by mouth. Takes Monday thru friday    . diclofenac sodium (VOLTAREN) 1 % GEL Apply 2 g topically 4 (four) times daily as needed (pain).     . Esomeprazole Magnesium (NEXIUM 24HR PO) Take 1 tablet by mouth daily as needed.     . febuxostat (ULORIC) 40 MG tablet Take 40 mg by mouth daily.     . tadalafil (CIALIS) 5 MG tablet Take 1 tablet (5 mg total) by mouth daily as needed for erectile dysfunction. 18 tablet 0  . traZODone (DESYREL) 100 MG tablet TAKE 1/2-1 TABLET BY MOUTH AT BEDTIME AS NEEDED FOR SLEEP. 90 tablet 3  . valACYclovir (VALTREX) 1000 MG tablet   11  . valsartan (DIOVAN) 80 MG tablet Take 1 tablet (80 mg total) by mouth 2 (two) times daily. Patient takes up to twice a day. 180 tablet 3  . levothyroxine (SYNTHROID, LEVOTHROID) 175 MCG tablet Take 1 tablet (175 mcg total) by mouth daily before breakfast. Due for 6 month f/u in Dec must see MD for refills 30 tablet 0  . Testosterone 20.25 MG/ACT (1.62%) GEL Place 4 Squirts onto the skin daily.  150 g 5   No facility-administered medications prior to visit.     ROS Review of Systems  Constitutional: Positive for fatigue. Negative for appetite change, chills, diaphoresis and fever.  HENT: Positive for congestion, sinus pain, sinus pressure and sore throat. Negative for facial swelling, postnasal drip, rhinorrhea and trouble swallowing.   Eyes: Negative.   Respiratory: Negative.  Negative for cough, chest tightness, shortness of breath, wheezing and stridor.   Cardiovascular: Negative for chest pain, palpitations and leg swelling.  Gastrointestinal: Negative for abdominal pain, constipation, diarrhea, nausea and vomiting.  Endocrine: Negative for cold intolerance and heat intolerance.  Genitourinary: Negative.   Musculoskeletal: Negative.  Negative for back pain, myalgias and neck pain.  Skin: Negative.   Neurological: Negative.  Negative for dizziness, weakness and numbness.  Hematological: Negative.  Negative for adenopathy. Does not bruise/bleed easily.  Psychiatric/Behavioral: Negative.     Objective:  BP (!) 116/58 (BP Location: Left Arm, Patient Position: Sitting, Cuff Size: Large)   Pulse 61   Temp 98.4 F (36.9 C) (Oral)   Resp 16   Ht 5\' 10"  (1.778 m)   Wt 286 lb (129.7 kg)   SpO2 97%   BMI 41.04 kg/m   BP Readings from Last 3 Encounters:  02/07/16 (!) 116/58  11/26/15 126/72  11/01/15 128/74    Wt Readings from  Last 3 Encounters:  02/07/16 286 lb (129.7 kg)  11/26/15 283 lb 6.4 oz (128.5 kg)  11/01/15 284 lb (128.8 kg)    Physical Exam  Constitutional: He is oriented to person, place, and time. No distress.  HENT:  Right Ear: Hearing, tympanic membrane, external ear and ear canal normal.  Left Ear: Hearing, tympanic membrane, external ear and ear canal normal.  Nose: No mucosal edema or sinus tenderness. Right sinus exhibits maxillary sinus tenderness. Left sinus exhibits maxillary sinus tenderness.  Mouth/Throat: Mucous membranes are normal.  Mucous membranes are not pale, not dry and not cyanotic. No oral lesions. No trismus in the jaw. No uvula swelling. Posterior oropharyngeal erythema present. No oropharyngeal exudate, posterior oropharyngeal edema or tonsillar abscesses.  Eyes: Conjunctivae are normal. Right eye exhibits no discharge. Left eye exhibits no discharge. No scleral icterus.  Neck: Normal range of motion. Neck supple. No JVD present. No tracheal tenderness present. No tracheal deviation present. No thyromegaly present.  Cardiovascular: Normal rate, regular rhythm, normal heart sounds and intact distal pulses.  Exam reveals no gallop and no friction rub.   No murmur heard. Pulmonary/Chest: Effort normal and breath sounds normal. No stridor. No respiratory distress. He has no wheezes. He has no rales. He exhibits no tenderness.  Abdominal: Soft. Bowel sounds are normal. He exhibits no distension and no mass. There is no tenderness. There is no rebound and no guarding.  Musculoskeletal: Normal range of motion. He exhibits no edema, tenderness or deformity.  Lymphadenopathy:    He has no cervical adenopathy.  Neurological: He is oriented to person, place, and time.  Skin: Skin is warm and dry. No rash noted. He is not diaphoretic. No erythema. No pallor.  Vitals reviewed.   Lab Results  Component Value Date   WBC 6.1 07/12/2015   HGB 16.4 07/12/2015   HCT 47.2 07/12/2015   PLT 299.0 07/12/2015   GLUCOSE 102 (H) 07/12/2015   CHOL 156 02/07/2016   TRIG 140.0 02/07/2016   HDL 44.90 02/07/2016   LDLCALC 83 02/07/2016   ALT 18 07/12/2015   AST 18 07/12/2015   NA 139 07/12/2015   K 4.4 07/12/2015   CL 104 07/12/2015   CREATININE 0.84 07/12/2015   BUN 17 07/12/2015   CO2 28 07/12/2015   TSH 0.50 02/07/2016   PSA 0.68 11/19/2015   INR 2.4 (H) 07/29/2006   HGBA1C 5.6 09/29/2014    Dg Chest 2 View  Result Date: 11/02/2015 CLINICAL DATA:  65 year old male with dry cough for the past 2 months. Hypertension.  Nonsmoker. Initial encounter. EXAM: CHEST  2 VIEW COMPARISON:  01/18/2015 and 08/10/2012 chest x-ray. FINDINGS: No infiltrate, congestive heart failure or pneumothorax. Chronic minimal peribronchial thickening and increased lung markings. Heart size within normal limits. No plain film evidence pulmonary malignancy. Mild thoracic kyphosis and degenerative changes. IMPRESSION: No acute pulmonary abnormality. Electronically Signed   By: Genia Del M.D.   On: 11/02/2015 09:07    Assessment & Plan:   Seward was seen today for hypothyroidism and sinusitis.  Diagnoses and all orders for this visit:  Acute non-recurrent maxillary sinusitis- will treat the infection with a cephalosporin antibiotic. -     cefdinir (OMNICEF) 300 MG capsule; Take 1 capsule (300 mg total) by mouth 2 (two) times daily.  Other specified hypothyroidism- his TSH is in the normal range and his nodules are suppressed, will continue the current dose of levothyroxine. -     Lipid panel; Future -  Thyroid Panel With TSH; Future -     levothyroxine (SYNTHROID, LEVOTHROID) 175 MCG tablet; Take 1 tablet (175 mcg total) by mouth daily before breakfast.  Left thyroid nodule- as above -     levothyroxine (SYNTHROID, LEVOTHROID) 175 MCG tablet; Take 1 tablet (175 mcg total) by mouth daily before breakfast.   I have changed Jonathan Powers's levothyroxine. I am also having him start on cefdinir. Additionally, I am having him maintain his diclofenac sodium, cetirizine, Esomeprazole Magnesium (NEXIUM 24HR PO), febuxostat, acetaminophen, Vitamin D-3, Testosterone, tadalafil, valACYclovir, valsartan, albuterol, traZODone, and Testosterone.  Meds ordered this encounter  Medications  . Testosterone 30 MG/ACT SOLN    Refill:  5  . cefdinir (OMNICEF) 300 MG capsule    Sig: Take 1 capsule (300 mg total) by mouth 2 (two) times daily.    Dispense:  20 capsule    Refill:  1  . levothyroxine (SYNTHROID, LEVOTHROID) 175 MCG tablet    Sig:  Take 1 tablet (175 mcg total) by mouth daily before breakfast.    Dispense:  90 tablet    Refill:  1     Follow-up: Return if symptoms worsen or fail to improve.  Scarlette Calico, MD

## 2016-02-07 NOTE — Progress Notes (Signed)
Pre visit review using our clinic review tool, if applicable. No additional management support is needed unless otherwise documented below in the visit note. 

## 2016-02-07 NOTE — Patient Instructions (Signed)

## 2016-02-11 MED FILL — LEVOTHYROXINE 175 MCG TABLE: 175 | 90 days supply | Qty: 90 | Fill #0

## 2016-02-11 MED FILL — valACYclovir HCL 1 GM TABS: 1 | 30 days supply | Qty: 20 | Fill #3

## 2016-02-11 MED FILL — DICLOFENAC SODIUM 1% GEL: 1 | 30 days supply | Qty: 1000 | Fill #7

## 2016-02-11 MED FILL — CIALIS 5 MG TABLET: 5 | 30 days supply | Qty: 30 | Fill #3

## 2016-02-15 MED FILL — AZITHROMYCIN 250 MG TABLET: 250 | 10 days supply | Qty: 12 | Fill #0

## 2016-02-15 MED FILL — TESTOSTERONE 30 MG/1.5 ML P: 30 | 30 days supply | Qty: 90 | Fill #2

## 2016-02-18 DIAGNOSIS — G4733 Obstructive sleep apnea (adult) (pediatric): Secondary | ICD-10-CM | POA: Diagnosis not present

## 2016-03-01 ENCOUNTER — Other Ambulatory Visit: Payer: Self-pay | Admitting: *Deleted

## 2016-03-01 DIAGNOSIS — M2550 Pain in unspecified joint: Secondary | ICD-10-CM | POA: Diagnosis not present

## 2016-03-01 DIAGNOSIS — Z113 Encounter for screening for infections with a predominantly sexual mode of transmission: Secondary | ICD-10-CM

## 2016-03-01 DIAGNOSIS — R300 Dysuria: Secondary | ICD-10-CM

## 2016-03-01 DIAGNOSIS — R5381 Other malaise: Secondary | ICD-10-CM | POA: Diagnosis not present

## 2016-03-01 DIAGNOSIS — E559 Vitamin D deficiency, unspecified: Secondary | ICD-10-CM | POA: Diagnosis not present

## 2016-03-01 DIAGNOSIS — Z79899 Other long term (current) drug therapy: Secondary | ICD-10-CM | POA: Diagnosis not present

## 2016-03-01 LAB — CBC WITH DIFFERENTIAL/PLATELET
BASOS ABS: 0 {cells}/uL (ref 0–200)
BASOS PCT: 0 %
EOS PCT: 3 %
Eosinophils Absolute: 159 cells/uL (ref 15–500)
HCT: 47 % (ref 38.5–50.0)
HEMOGLOBIN: 16.2 g/dL (ref 13.2–17.1)
LYMPHS ABS: 1431 {cells}/uL (ref 850–3900)
Lymphocytes Relative: 27 %
MCH: 30.9 pg (ref 27.0–33.0)
MCHC: 34.5 g/dL (ref 32.0–36.0)
MCV: 89.7 fL (ref 80.0–100.0)
MPV: 8.3 fL (ref 7.5–12.5)
Monocytes Absolute: 530 cells/uL (ref 200–950)
Monocytes Relative: 10 %
NEUTROS ABS: 3180 {cells}/uL (ref 1500–7800)
Neutrophils Relative %: 60 %
Platelets: 267 10*3/uL (ref 140–400)
RBC: 5.24 MIL/uL (ref 4.20–5.80)
RDW: 13.6 % (ref 11.0–15.0)
WBC: 5.3 10*3/uL (ref 3.8–10.8)

## 2016-03-01 LAB — HEPATITIS PANEL, ACUTE
HCV Ab: NEGATIVE
HEP A IGM: NONREACTIVE
HEP B C IGM: NONREACTIVE
Hepatitis B Surface Ag: NEGATIVE

## 2016-03-01 LAB — COMPLETE METABOLIC PANEL WITH GFR
ALBUMIN: 4.1 g/dL (ref 3.6–5.1)
ALK PHOS: 46 U/L (ref 40–115)
ALT: 22 U/L (ref 9–46)
AST: 24 U/L (ref 10–35)
BILIRUBIN TOTAL: 0.9 mg/dL (ref 0.2–1.2)
BUN: 14 mg/dL (ref 7–25)
CO2: 25 mmol/L (ref 20–31)
Calcium: 9.3 mg/dL (ref 8.6–10.3)
Chloride: 104 mmol/L (ref 98–110)
Creat: 0.92 mg/dL (ref 0.70–1.25)
GFR, Est African American: >89 mL/min (ref >=60)
GFR, Est Non African American: 88 mL/min (ref >=60)
GLUCOSE: 92 mg/dL (ref 65–99)
Potassium: 4.3 mmol/L (ref 3.5–5.3)
SODIUM: 140 mmol/L (ref 135–146)
TOTAL PROTEIN: 6.9 g/dL (ref 6.1–8.1)

## 2016-03-01 LAB — URINALYSIS, ROUTINE W REFLEX MICROSCOPIC
BILIRUBIN URINE: NEGATIVE
GLUCOSE, UA: NEGATIVE
HGB URINE DIPSTICK: NEGATIVE
Leukocytes, UA: NEGATIVE
Nitrite: NEGATIVE
PH: 5.5 (ref 5.0–8.0)
PROTEIN: NEGATIVE
Specific Gravity, Urine: 1.03 (ref 1.001–1.035)

## 2016-03-01 LAB — HIV ANTIBODY (ROUTINE TESTING W REFLEX): HIV 1&2 Ab, 4th Generation: NONREACTIVE

## 2016-03-01 LAB — TSH: TSH: 0.86 mIU/L (ref 0.40–4.50)

## 2016-03-01 LAB — SEDIMENTATION RATE: Sed Rate: 6 mm/hr (ref 0–20)

## 2016-03-01 LAB — CK: CK TOTAL: 173 U/L (ref 7–232)

## 2016-03-02 LAB — VITAMIN D 25 HYDROXY (VIT D DEFICIENCY, FRACTURES): VIT D 25 HYDROXY: 46 ng/mL (ref 30–100)

## 2016-03-02 LAB — RHEUMATOID FACTOR: Rhuematoid fact SerPl-aCnc: <14 IU/mL (ref <14)

## 2016-03-02 LAB — CYCLIC CITRUL PEPTIDE ANTIBODY, IGG: Cyclic Citrullin Peptide Ab: <16 Units

## 2016-03-02 LAB — IGG, IGA, IGM
IGG (IMMUNOGLOBIN G), SERUM: 989 mg/dL (ref 694–1618)
IgA: 189 mg/dL (ref 81–463)
IgM, Serum: 39 mg/dL — ABNORMAL LOW (ref 48–271)

## 2016-03-02 LAB — TESTOSTERONE TOTAL,FREE,BIO, MALES
ALBUMIN: 4.1 g/dL (ref 3.6–5.1)
SEX HORMONE BINDING: 30 nmol/L (ref 22–77)
Testosterone, Bioavailable: 154.6 ng/dL (ref 110.0–575.0)
Testosterone, Free: 82.1 pg/mL (ref 46.0–224.0)
Testosterone: 538 ng/dL (ref 250–827)

## 2016-03-02 LAB — ANA: ANA: NEGATIVE

## 2016-03-03 LAB — PROTEIN ELECTROPHORESIS, SERUM
ALPHA-1-GLOBULIN: 0.3 g/dL (ref 0.2–0.3)
ALPHA-2-GLOBULIN: 0.7 g/dL (ref 0.5–0.9)
Albumin ELP: 4.3 g/dL (ref 3.8–4.8)
BETA GLOBULIN: 0.4 g/dL (ref 0.4–0.6)
Beta 2: 0.3 g/dL (ref 0.2–0.5)
GAMMA GLOBULIN: 0.9 g/dL (ref 0.8–1.7)
Total Protein, Serum Electrophoresis: 6.9 g/dL (ref 6.1–8.1)

## 2016-03-04 LAB — 14-3-3 ETA PROTEIN

## 2016-03-05 NOTE — Progress Notes (Signed)
Labs within normal limits except IgM is low.

## 2016-03-13 MED FILL — CIALIS 5 MG TABLET: 5 | 30 days supply | Qty: 30 | Fill #4

## 2016-03-13 MED FILL — DICLOFENAC SODIUM 1% GEL: 1 | 30 days supply | Qty: 1000 | Fill #8

## 2016-03-13 MED FILL — TESTOSTERONE 30 MG/1.5 ML P: 30 | 30 days supply | Qty: 90 | Fill #3

## 2016-03-16 ENCOUNTER — Other Ambulatory Visit: Payer: Self-pay | Admitting: Internal Medicine

## 2016-03-16 DIAGNOSIS — G4733 Obstructive sleep apnea (adult) (pediatric): Secondary | ICD-10-CM | POA: Diagnosis not present

## 2016-03-16 MED FILL — VALSARTAN 80 MG TABLET: 80 | 90 days supply | Qty: 180 | Fill #0

## 2016-04-12 MED FILL — CIALIS 5 MG TABLET: 5 | 30 days supply | Qty: 30 | Fill #5

## 2016-04-13 ENCOUNTER — Other Ambulatory Visit: Payer: Self-pay | Admitting: Rheumatology

## 2016-04-13 DIAGNOSIS — R52 Pain, unspecified: Secondary | ICD-10-CM

## 2016-04-13 DIAGNOSIS — R5383 Other fatigue: Secondary | ICD-10-CM

## 2016-04-13 DIAGNOSIS — M2550 Pain in unspecified joint: Secondary | ICD-10-CM | POA: Diagnosis not present

## 2016-04-13 DIAGNOSIS — M791 Myalgia, unspecified site: Secondary | ICD-10-CM

## 2016-04-13 LAB — CBC WITH DIFFERENTIAL/PLATELET
BASOS ABS: 0 {cells}/uL (ref 0–200)
BASOS PCT: 0 %
EOS PCT: 3 %
Eosinophils Absolute: 198 cells/uL (ref 15–500)
HCT: 44.6 % (ref 38.5–50.0)
HEMOGLOBIN: 15.8 g/dL (ref 13.2–17.1)
LYMPHS ABS: 2178 {cells}/uL (ref 850–3900)
Lymphocytes Relative: 33 %
MCH: 30.8 pg (ref 27.0–33.0)
MCHC: 35.4 g/dL (ref 32.0–36.0)
MCV: 86.9 fL (ref 80.0–100.0)
MPV: 8.3 fL (ref 7.5–12.5)
Monocytes Absolute: 660 cells/uL (ref 200–950)
Monocytes Relative: 10 %
NEUTROS ABS: 3564 {cells}/uL (ref 1500–7800)
Neutrophils Relative %: 54 %
Platelets: 246 10*3/uL (ref 140–400)
RBC: 5.13 MIL/uL (ref 4.20–5.80)
RDW: 13.4 % (ref 11.0–15.0)
WBC: 6.6 10*3/uL (ref 3.8–10.8)

## 2016-04-13 LAB — COMPLETE METABOLIC PANEL WITH GFR
ALBUMIN: 4.1 g/dL (ref 3.6–5.1)
ALK PHOS: 48 U/L (ref 40–115)
ALT: 23 U/L (ref 9–46)
AST: 21 U/L (ref 10–35)
BUN: 9 mg/dL (ref 7–25)
CO2: 27 mmol/L (ref 20–31)
Calcium: 9.2 mg/dL (ref 8.6–10.3)
Chloride: 104 mmol/L (ref 98–110)
Creat: 0.92 mg/dL (ref 0.70–1.25)
GFR, EST NON AFRICAN AMERICAN: 88 mL/min (ref >=60)
GLUCOSE: 78 mg/dL (ref 65–99)
POTASSIUM: 4.4 mmol/L (ref 3.5–5.3)
SODIUM: 141 mmol/L (ref 135–146)
Total Bilirubin: 0.6 mg/dL (ref 0.2–1.2)
Total Protein: 6.6 g/dL (ref 6.1–8.1)

## 2016-04-13 LAB — SEDIMENTATION RATE: SED RATE: 10 mm/h (ref 0–20)

## 2016-04-13 LAB — CK: CK TOTAL: 161 U/L (ref 7–232)

## 2016-04-13 MED FILL — DICLOFENAC SODIUM 1% GEL: 1 | 30 days supply | Qty: 1000 | Fill #9

## 2016-04-13 MED FILL — TESTOSTERONE 30 MG/1.5 ML P: 30 | 30 days supply | Qty: 90 | Fill #4

## 2016-04-13 NOTE — Progress Notes (Signed)
I reviewed her labs that were ordered today.All of your labs are normal.CK is normal at 161.Sedimentation rate is normal at 10. CBC with differential and CMP with GFR is normal

## 2016-04-16 MED FILL — traZODone HCL 100 MG TABS: 100 | 90 days supply | Qty: 90 | Fill #1

## 2016-04-19 ENCOUNTER — Other Ambulatory Visit (INDEPENDENT_AMBULATORY_CARE_PROVIDER_SITE_OTHER): Payer: Self-pay | Admitting: *Deleted

## 2016-04-19 DIAGNOSIS — G8929 Other chronic pain: Secondary | ICD-10-CM

## 2016-04-19 DIAGNOSIS — M25572 Pain in left ankle and joints of left foot: Secondary | ICD-10-CM

## 2016-04-19 DIAGNOSIS — M5441 Lumbago with sciatica, right side: Principal | ICD-10-CM

## 2016-04-19 DIAGNOSIS — M5442 Lumbago with sciatica, left side: Principal | ICD-10-CM

## 2016-04-20 ENCOUNTER — Other Ambulatory Visit (INDEPENDENT_AMBULATORY_CARE_PROVIDER_SITE_OTHER): Payer: Self-pay | Admitting: *Deleted

## 2016-04-20 ENCOUNTER — Ambulatory Visit (INDEPENDENT_AMBULATORY_CARE_PROVIDER_SITE_OTHER): Payer: 59

## 2016-04-20 ENCOUNTER — Encounter (INDEPENDENT_AMBULATORY_CARE_PROVIDER_SITE_OTHER): Payer: Self-pay | Admitting: Orthopaedic Surgery

## 2016-04-20 ENCOUNTER — Ambulatory Visit (INDEPENDENT_AMBULATORY_CARE_PROVIDER_SITE_OTHER): Payer: 59 | Admitting: Orthopaedic Surgery

## 2016-04-20 DIAGNOSIS — G8929 Other chronic pain: Secondary | ICD-10-CM

## 2016-04-20 DIAGNOSIS — M5441 Lumbago with sciatica, right side: Principal | ICD-10-CM

## 2016-04-20 DIAGNOSIS — M5442 Lumbago with sciatica, left side: Secondary | ICD-10-CM

## 2016-04-20 DIAGNOSIS — M25572 Pain in left ankle and joints of left foot: Secondary | ICD-10-CM

## 2016-04-20 NOTE — Progress Notes (Deleted)
   Office Visit Note   Patient: Jonathan Powers           Date of Birth: 1950/06/05           MRN: [id number] Visit Date: 04/20/2016              Requested by: [name], MD [address] [place],  [address] PCP: Scarlette Calico, MD   Assessment & Plan: Visit Diagnoses: No diagnosis found.  Plan: ***  Follow-Up Instructions: No Follow-up on file.   Orders:  No orders of the defined types were placed in this encounter.  No orders of the defined types were placed in this encounter.     Procedures: No procedures performed   Clinical Data: No additional findings.   Subjective: Chief Complaint  Patient presents with  . Left Knee - Pain, Edema    Left knee pain.    Review of Systems   Objective: Vital Signs: There were no vitals taken for this visit.  Physical Exam  Ortho Exam  Specialty Comments:  No specialty comments available.  Imaging: No results found.   PMFS History: Patient Active Problem List   Diagnosis Date Noted  . Sinusitis, acute maxillary 10/01/2015  . Tinnitus 07/12/2015  . Superior mesenteric artery aneurysm ([place]) 07/12/2015  . Left thyroid nodule   . OSA (obstructive sleep apnea) 01/27/2013  . Hypothyroid   . Symptomatic PVCs 09/06/2012  . Dyslipidemia 09/06/2012  . BPH (benign prostatic hypertrophy) 09/06/2012  . OBESITY NOS 05/17/2006  . Essential hypertension 05/17/2006  . ALLERGIC RHINITIS 05/17/2006  . OSTEOARTHRITIS 05/17/2006   Past Medical History:  Diagnosis Date  . ALLERGIC RHINITIS   . CELLULITIS, LEG, RIGHT    Recurrent R hip cellulitis 1/08,3/08   . Diverticulitis   . GERD (gastroesophageal reflux disease)   . Gout   . Hashimoto's thyroiditis   . Hypertension   . Hypogonadism male    low T, a/w ED  . Hypothyroid   . Left thyroid nodule 2008   on [place], decrease size in 2011 [place]  . Migraines    Atypical and ocular  . OSTEOARTHRITIS   . Seasonal allergies     Family History    Problem Relation Age of Onset  . Arthritis Father   . Heart disease Father   . Diabetes Father   . Vascular Disease Father     AoBifem  . Arthritis Mother   . Diabetes Mother   . Multiple sclerosis Mother   . Cancer Paternal Grandmother     "GI"  . Cancer Paternal Grandfather     stomach cancer  . Thyroid cancer Sister   . Uterine cancer Sister     Past Surgical History:  Procedure Laterality Date  . CHOLECYSTECTOMY  2004  . CTR Bilateral 1982  . CYSTOSCOPY  1974  . open reduction L little finger Left 1970  . Repair digital thumb, left Left 1990  . Right shoulder cuff repair Right 09/15/11  . Right shoulder SAD, DCR Right 02/05/11  . TOTAL HIP ARTHROPLASTY Left 07/26/06  . 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 Smoking status: Never Smoker  . Smokeless tobacco: Never Used  . Alcohol use 0.0 oz/week     Comment: rare  . Drug use: No  . Sexual activity: Not on file

## 2016-04-20 NOTE — Progress Notes (Signed)
Office Visit Note   Patient: Jonathan Powers           Date of Birth: 10/16/1950           MRN: [id number] Visit Date: 04/20/2016              Requested by: [name], MD [address] [place], [place] [address] PCP: Scarlette Calico, MD   Assessment & Plan: Visit Diagnoses:  1. Chronic left-sided low back pain with left-sided sciatica   2. Pain in left ankle and joints of left foot     Plan: MRI scan of lumbar spine and left ankle  Follow-Up Instructions: Return after MRI.   Orders:  No orders of the defined types were placed in this encounter.  No orders of the defined types were placed in this encounter.     Procedures: No procedures performed   Clinical Data: No additional findings.   Subjective: Chief Complaint  Patient presents with  . Left Knee - Pain, Edema  HPI  Ted has been experiencing low back pain associated with left lower extremity radicular discomfort. Does have some tingling in his left ankle and foot without history of injury or trauma. No bladder changes. In addition he's been experiencing pain in both medial and lateral aspect of his left ankle for several months. He does have a pronation and wears inserts. Films of his lumbar spine demonstrates significant degenerative changes throughout the lower spine facet joint changes at L4-5 and L5-S1. He also has had films of his left ankle some subsidence of the midfoot clinical evidence of pronation and possible posterior tibial tendon insufficiency.  Review of Systems   Objective: Vital Signs: There were no vitals taken for this visit.  Physical Exam  Ortho Exam leg raise negative bilaterally. He has bilateral total hip replacements without pain with internal and external rotation. Does have some arthritic changes in his left knee. The lateral compartment by prior films there is some mild crepitation. No obvious effusion. Left ankle exam demonstrates some tenderness along the posterior  tibial tendon. Increased pronation. Pain laterally along the malleolus possibly consistent with impingement. +1-2 pulses. Mild pitting edema ankle with early venous stasis changes.   Specialty Comments:  No specialty comments available.  Imaging: Xr Lumbar Spine 2-3 Views  Result Date: 04/20/2016 Films of the lumbar spine were obtained in 2 projections, AP and lateral. A mild left lumbar scoliosis. Mild degenerative changes at the left SI joint. Use calcification of the anterior longitudinal ligament L1-2 and in the lower thoracic spine. Aid. Facet joint arthropathy at L4-5 and L5-S1. There are some posteriorly projected osteophytes at L4-5. Minimal calcification of the abdominal aorta without obvious aneurysmal formation.    PMFS History: Patient Active Problem List   Diagnosis Date Noted  . Sinusitis, acute maxillary 10/01/2015  . Tinnitus 07/12/2015  . Superior mesenteric artery aneurysm ([place]) 07/12/2015  . Left thyroid nodule   . OSA (obstructive sleep apnea) 01/27/2013  . Hypothyroid   . Symptomatic PVCs 09/06/2012  . Dyslipidemia 09/06/2012  . BPH (benign prostatic hypertrophy) 09/06/2012  . OBESITY NOS 05/17/2006  . Essential hypertension 05/17/2006  . ALLERGIC RHINITIS 05/17/2006  . OSTEOARTHRITIS 05/17/2006   Past Medical History:  Diagnosis Date  . ALLERGIC RHINITIS   . CELLULITIS, LEG, RIGHT    Recurrent R hip cellulitis 1/08,3/08   . Diverticulitis   . GERD (gastroesophageal reflux disease)   . Gout   . Hashimoto's thyroiditis   .  Hypertension   . Hypogonadism male    low T, a/w ED  . Hypothyroid   . Left thyroid nodule 2008   on [place], decrease size in 2011 [place]  . Migraines    Atypical and ocular  . OSTEOARTHRITIS   . Seasonal allergies     Family History  Problem Relation Age of Onset  . Arthritis Father   . Heart disease Father   . Diabetes Father   . Vascular Disease Father     AoBifem  . Arthritis Mother   . Diabetes Mother   . Multiple sclerosis  Mother   . Cancer Paternal Grandmother     "GI"  . Cancer Paternal Grandfather     stomach cancer  . Thyroid cancer Sister   . Uterine cancer Sister     Past Surgical History:  Procedure Laterality Date  . CHOLECYSTECTOMY  2004  . CTR Bilateral 1982  . CYSTOSCOPY  1974  . open reduction L little finger Left 1970  . Repair digital thumb, left Left 1990  . Right shoulder cuff repair Right 09/15/11  . Right shoulder SAD, DCR Right 02/05/11  . TOTAL HIP ARTHROPLASTY Left 07/26/06  . 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 Smoking status: Never Smoker  . Smokeless tobacco: Never Used  . Alcohol use 0.0 oz/week     Comment: rare  . Drug use: No  . Sexual activity: Not on file

## 2016-04-30 ENCOUNTER — Ambulatory Visit
Admission: RE | Admit: 2016-04-30 | Discharge: 2016-04-30 | Disposition: A | Discharge status: Home / Self Care | Payer: 59 | Source: Ambulatory Visit | Attending: Orthopaedic Surgery | Admitting: Orthopaedic Surgery

## 2016-04-30 DIAGNOSIS — S91012A Laceration without foreign body, left ankle, initial encounter: Secondary | ICD-10-CM | POA: Diagnosis not present

## 2016-04-30 DIAGNOSIS — M48061 Spinal stenosis, lumbar region without neurogenic claudication: Secondary | ICD-10-CM | POA: Diagnosis not present

## 2016-04-30 DIAGNOSIS — M5441 Lumbago with sciatica, right side: Principal | ICD-10-CM

## 2016-04-30 DIAGNOSIS — G8929 Other chronic pain: Secondary | ICD-10-CM

## 2016-04-30 DIAGNOSIS — M5442 Lumbago with sciatica, left side: Principal | ICD-10-CM

## 2016-04-30 DIAGNOSIS — M25572 Pain in left ankle and joints of left foot: Secondary | ICD-10-CM

## 2016-05-15 MED FILL — CIALIS 5 MG TABLET: 5 | 30 days supply | Qty: 30 | Fill #0

## 2016-05-15 MED FILL — LEVOTHYROXINE 175 MCG TABLE: 175 | 90 days supply | Qty: 90 | Fill #1

## 2016-05-16 MED FILL — DICLOFENAC SODIUM 1% GEL: 1 | 30 days supply | Qty: 1000 | Fill #10

## 2016-05-16 MED FILL — TESTOSTERONE 30 MG/1.5 ML P: 30 | 30 days supply | Qty: 90 | Fill #5

## 2016-05-18 DIAGNOSIS — G4733 Obstructive sleep apnea (adult) (pediatric): Secondary | ICD-10-CM | POA: Diagnosis not present

## 2016-06-13 ENCOUNTER — Other Ambulatory Visit (INDEPENDENT_AMBULATORY_CARE_PROVIDER_SITE_OTHER): Payer: Self-pay | Admitting: Orthopaedic Surgery

## 2016-06-13 MED ORDER — CICLOPIROX 8 % EX SOLN: Freq: Every day | CUTANEOUS | 0 refills | Status: DC

## 2016-06-13 MED FILL — DICLOFENAC SODIUM 1% GEL: 1 | 30 days supply | Qty: 1000 | Fill #11

## 2016-06-13 MED FILL — VALSARTAN 80 MG TABLET: 80 | 90 days supply | Qty: 180 | Fill #1

## 2016-06-13 MED FILL — CICLOPIROX 8% SOLUTION: 8 | 30 days supply | Qty: 7 | Fill #0

## 2016-06-15 MED FILL — CIALIS 5 MG TABLET: 5 | 30 days supply | Qty: 30 | Fill #1

## 2016-06-16 DIAGNOSIS — L918 Other hypertrophic disorders of the skin: Secondary | ICD-10-CM | POA: Diagnosis not present

## 2016-06-16 DIAGNOSIS — L308 Other specified dermatitis: Secondary | ICD-10-CM | POA: Diagnosis not present

## 2016-06-21 MED FILL — TRIAMCINOLONE 0.1% CREAM: 0.1 | 30 days supply | Qty: 80 | Fill #0

## 2016-06-27 MED FILL — TESTOSTERONE 30 MG/1.5 ML P: 30 | 30 days supply | Qty: 90 | Fill #0

## 2016-06-30 MED FILL — AZITHROMYCIN 250 MG TABLET: 250 | 10 days supply | Qty: 12 | Fill #0

## 2016-07-03 DIAGNOSIS — E291 Testicular hypofunction: Secondary | ICD-10-CM | POA: Diagnosis not present

## 2016-07-03 DIAGNOSIS — Z125 Encounter for screening for malignant neoplasm of prostate: Secondary | ICD-10-CM | POA: Diagnosis not present

## 2016-07-03 LAB — PSA: PSA: 0.61

## 2016-07-12 ENCOUNTER — Telehealth: Payer: Self-pay | Admitting: Rheumatology

## 2016-07-12 ENCOUNTER — Ambulatory Visit: Payer: 59 | Admitting: Rheumatology

## 2016-07-12 NOTE — Telephone Encounter (Signed)
Spoke with Pt. He is having increased pain and swelling in bilateral wrists. Left more than right. As he was in the office today I placed the US probe on his wrist joints.Wrist extensor tendons showed tenosynovitis and small effusion. More prominent in the left WJ. His autoimmune labs and [place] has been negative in the past. I will obtain autoimmune serology.

## 2016-07-12 NOTE — Telephone Encounter (Signed)
Patient called and states he is having joint pain in BIL wrists and SI joint.  Patient states Dr. Estanislado Pandy is aware of previous SI joint injection done by Dr. Ernestina Patches.

## 2016-07-19 MED FILL — CIALIS 5 MG TABLET: 5 | 30 days supply | Qty: 30 | Fill #2

## 2016-07-19 MED FILL — DICLOFENAC SODIUM 1% GEL: 1 | 30 days supply | Qty: 1000 | Fill #12

## 2016-07-19 MED FILL — traZODone HCL 100 MG TABS: 100 | 90 days supply | Qty: 90 | Fill #2

## 2016-07-19 MED FILL — valACYclovir HCL 1 GM TABS: 1 | 30 days supply | Qty: 20 | Fill #4

## 2016-07-21 ENCOUNTER — Ambulatory Visit (INDEPENDENT_AMBULATORY_CARE_PROVIDER_SITE_OTHER): Payer: 59 | Admitting: Rheumatology

## 2016-07-21 ENCOUNTER — Encounter: Payer: Self-pay | Admitting: Rheumatology

## 2016-07-21 VITALS — BP 119/65 | HR 56 | Resp 16 | Ht 68.0 in | Wt 282.0 lb

## 2016-07-21 DIAGNOSIS — Z96643 Presence of artificial hip joint, bilateral: Secondary | ICD-10-CM

## 2016-07-21 DIAGNOSIS — M545 Low back pain, unspecified: Secondary | ICD-10-CM

## 2016-07-21 DIAGNOSIS — R7989 Other specified abnormal findings of blood chemistry: Secondary | ICD-10-CM | POA: Diagnosis not present

## 2016-07-21 DIAGNOSIS — Z8719 Personal history of other diseases of the digestive system: Secondary | ICD-10-CM | POA: Insufficient documentation

## 2016-07-21 DIAGNOSIS — Z8669 Personal history of other diseases of the nervous system and sense organs: Secondary | ICD-10-CM | POA: Diagnosis not present

## 2016-07-21 DIAGNOSIS — G8929 Other chronic pain: Secondary | ICD-10-CM | POA: Insufficient documentation

## 2016-07-21 DIAGNOSIS — M79641 Pain in right hand: Secondary | ICD-10-CM | POA: Diagnosis not present

## 2016-07-21 DIAGNOSIS — Z9889 Other specified postprocedural states: Secondary | ICD-10-CM | POA: Diagnosis not present

## 2016-07-21 DIAGNOSIS — Z79899 Other long term (current) drug therapy: Secondary | ICD-10-CM

## 2016-07-21 DIAGNOSIS — M19041 Primary osteoarthritis, right hand: Secondary | ICD-10-CM

## 2016-07-21 DIAGNOSIS — M47896 Other spondylosis, lumbar region: Secondary | ICD-10-CM | POA: Diagnosis not present

## 2016-07-21 DIAGNOSIS — Z9049 Acquired absence of other specified parts of digestive tract: Secondary | ICD-10-CM | POA: Insufficient documentation

## 2016-07-21 DIAGNOSIS — M19042 Primary osteoarthritis, left hand: Secondary | ICD-10-CM

## 2016-07-21 DIAGNOSIS — Z8639 Personal history of other endocrine, nutritional and metabolic disease: Secondary | ICD-10-CM | POA: Diagnosis not present

## 2016-07-21 DIAGNOSIS — M79642 Pain in left hand: Secondary | ICD-10-CM

## 2016-07-21 DIAGNOSIS — M2550 Pain in unspecified joint: Secondary | ICD-10-CM | POA: Diagnosis not present

## 2016-07-21 DIAGNOSIS — Z8679 Personal history of other diseases of the circulatory system: Secondary | ICD-10-CM

## 2016-07-21 DIAGNOSIS — M1A09X0 Idiopathic chronic gout, multiple sites, without tophus (tophi): Secondary | ICD-10-CM | POA: Diagnosis not present

## 2016-07-21 HISTORY — DX: Low back pain, unspecified: M54.50

## 2016-07-21 HISTORY — DX: Other chronic pain: G89.29

## 2016-07-21 MED ORDER — HYDROXYCHLOROQUINE SULFATE 200 MG PO TABS: 200.0000 mg | ORAL_TABLET | Freq: Two times a day (BID) | ORAL | 2 refills | Status: DC

## 2016-07-21 MED ORDER — PREDNISONE 5 MG PO TABS: ORAL_TABLET | ORAL | 0 refills | Status: DC

## 2016-07-21 MED FILL — HYDROXYCHLOROQUINE 200 MG T: 200 | 30 days supply | Qty: 60 | Fill #0

## 2016-07-21 MED FILL — predniSONE 5 MG TABS: 5 | 28 days supply | Qty: 35 | Fill #0

## 2016-07-21 NOTE — Patient Instructions (Addendum)
Standing Labs We placed an order today for your standing lab work.    Please come back and get your standing labs in 1 month, 3 months, then every 5 months  We have open lab Monday through Friday from 8:30-11:30 AM and 1:30-4 PM at the office of Dr. Tresa Moore, PA.   The office is located at [address] [place], [place], [place] [address] No appointment is necessary.   Labs are drawn by [facility].  You may receive a bill from [place] [place] for your lab work. If you have any questions regarding directions or hours of operation,  please call [phone].    Hydroxychloroquine tablets What is this medicine? HYDROXYCHLOROQUINE (hye drox ee KLOR oh kwin) is used to treat rheumatoid arthritis and systemic lupus erythematosus. It is also used to treat malaria. This medicine may be used for other purposes; ask your health care provider or pharmacist if you have questions. COMMON BRAND NAME(S): Plaquenil, Quineprox What should I tell my health care provider before I take this medicine? They need to know if you have any of these conditions: -diabetes -eye disease, vision problems -G6PD deficiency -history of blood diseases -history of irregular heartbeat -if you often drink alcohol -kidney disease -liver disease -porphyria -psoriasis -seizures -an unusual or allergic reaction to chloroquine, hydroxychloroquine, other medicines, foods, dyes, or preservatives -pregnant or trying to get pregnant -breast-feeding How should I use this medicine? Take this medicine by mouth with a glass of water. Follow the directions on the prescription label. Avoid taking antacids within 4 hours of taking this medicine. It is best to separate these medicines by at least 4 hours. Do not cut, crush or chew this medicine. You can take it with or without food. If it upsets your stomach, take it with food. Take your medicine at regular intervals. Do not take your medicine more often than directed.  Take all of your medicine as directed even if you think you are better. Do not skip doses or stop your medicine early. Talk to your pediatrician regarding the use of this medicine in children. While this drug may be prescribed for selected conditions, precautions do apply. Overdosage: If you think you have taken too much of this medicine contact a poison control center or emergency room at once. NOTE: This medicine is only for you. Do not share this medicine with others. What if I miss a dose? If you miss a dose, take it as soon as you can. If it is almost time for your next dose, take only that dose. Do not take double or extra doses. What may interact with this medicine? Do not take this medicine with any of the following medications: -cisapride -dofetilide -dronedarone -live virus vaccines -penicillamine -pimozide -thioridazine -ziprasidone This medicine may also interact with the following medications: -ampicillin -antacids -cimetidine -cyclosporine -digoxin -medicines for diabetes, like insulin, glipizide, glyburide -medicines for seizures like carbamazepine, phenobarbital, phenytoin -mefloquine -methotrexate -other medicines that prolong the QT interval (cause an abnormal heart rhythm) -praziquantel This list may not describe all possible interactions. Give your health care provider a list of all the medicines, herbs, non-prescription drugs, or dietary supplements you use. Also tell them if you smoke, drink alcohol, or use illegal drugs. Some items may interact with your medicine. What should I watch for while using this medicine? Tell your doctor or healthcare professional if your symptoms do not start to get better or if they get worse. Avoid taking antacids within 4 hours of taking this medicine. It  is best to separate these medicines by at least 4 hours. Tell your doctor or health care professional right away if you have any change in your eyesight. Your vision and blood  may be tested before and during use of this medicine. This medicine can make you more sensitive to the sun. Keep out of the sun. If you cannot avoid being in the sun, wear protective clothing and use sunscreen. Do not use sun lamps or tanning beds/booths. What side effects may I notice from receiving this medicine? Side effects that you should report to your doctor or health care professional as soon as possible: -allergic reactions like skin rash, itching or hives, swelling of the face, lips, or tongue -changes in vision -decreased hearing or ringing of the ears -redness, blistering, peeling or loosening of the skin, including inside the mouth -seizures -sensitivity to light -signs and symptoms of a dangerous change in heartbeat or heart rhythm like chest pain; dizziness; fast or irregular heartbeat; palpitations; feeling faint or lightheaded, falls; breathing problems -signs and symptoms of liver injury like dark yellow or brown urine; general ill feeling or flu-like symptoms; light-colored stools; loss of appetite; nausea; right upper belly pain; unusually weak or tired; yellowing of the eyes or skin -signs and symptoms of low blood sugar such as feeling anxious; confusion; dizziness; increased hunger; unusually weak or tired; sweating; shakiness; cold; irritable; headache; blurred vision; fast heartbeat; loss of consciousness -uncontrollable head, mouth, neck, arm, or leg movements Side effects that usually do not require medical attention (report to your doctor or health care professional if they continue or are bothersome): -anxious -diarrhea -dizziness -hair loss -headache -irritable -loss of appetite -nausea, vomiting -stomach pain This list may not describe all possible side effects. Call your doctor for medical advice about side effects. You may report side effects to FDA at 1-800-FDA-1088. Where should I keep my medicine? Keep out of the reach of children. In children, this  medicine can cause overdose with small doses. Store at room temperature between 15 and 30 degrees C (59 and 86 degrees F). Protect from moisture and light. Throw away any unused medicine after the expiration date. NOTE: This sheet is a summary. It may not cover all possible information. If you have questions about this medicine, talk to your doctor, pharmacist, or health care provider.  2018 Elsevier/Gold Standard (2015-09-22 14:16:15)

## 2016-07-21 NOTE — Progress Notes (Signed)
Office Visit Note  Patient: Jonathan Powers             Date of Birth: [birth year]           MRN: [id number]             PCP: [name], MD Referring: [name], MD Visit Date: 07/21/2016 Occupation: @GUAROCC @    Subjective:  Pain of the Left Wrist; Pain of the Lower Back; Joint Pain; and Insomnia   History of Present Illness: Jonathan Powers is a 66 y.o. male with history of multiple arthralgias. According to patient in his 10s he started having hip joint problems. He had some cortisone injections and eventually@age  47 he had left total hip replacement and at age 14 year old right total hip replacement. He did quite well as regards to his hip. He is also had known history of osteoarthritis in his knee joint which causes chronic discomfort but does not have much swelling. Over the last 5 years he's been having increased pain in his hands and decreased grip strength. He has increased pain and swelling in the last few months in his hands. He states symptoms have been gradually getting worse since November 2017. His both wrists joints have been swollen left more than right. The left hand gets numb. He's also been suffering from left lateral epicondylitis. He has discomfort in his bilateral shoulders left more than right he states he had MRI of his left shoulder joint in 2014 which we reviewed and it reported that he had some synovitis. He also had decreased recent issues with left ankle joint pain and swelling an MRI reported left posterior tibial tenosynovitis and tendon rupture he has history of pes planus. He also complains of bilateral feet discomfort is morning stiffness lasting almost all day now. He is also has been having nocturnal pain and insomnia due to nocturnal pain. He has known history of disc disease of lumbar spine and facet joint arthropathy. He states at one time he was diagnosed with osteoporosis and after taking testosterone calcium and vitamin D is to be bone density  was normal. He has had problems with the SI joint discomfort in the past and had SI joint cortisone injection which was helpful. He continues to have some lower back stiffness. He also developed a pruritic rash on his extremities few months back for which she was seen by Dr. Elvera Lennox. He states there was no diagnosis given. He was given some topical steroids which are helpful. There is no family history of psoriasis.  Activities of Daily Living:  Patient reports morning stiffness for all day hours.   Patient Reports nocturnal pain.  Difficulty dressing/grooming: Denies Difficulty climbing stairs: Reports Difficulty getting out of chair: Reports Difficulty using hands for taps, buttons, cutlery, and/or writing: Reports   Review of Systems  Constitutional: Positive for fatigue. Negative for night sweats and weakness ( ).  HENT: Positive for mouth dryness. Negative for mouth sores and nose dryness.   Eyes: Negative for redness and dryness.  Respiratory: Positive for shortness of breath. Negative for difficulty breathing.   Cardiovascular: Positive for hypertension. Negative for chest pain, palpitations, irregular heartbeat and swelling in legs/feet.  Gastrointestinal: Negative for constipation and diarrhea.  Endocrine: Negative for increased urination.  Musculoskeletal: Positive for arthralgias, joint pain, joint swelling, myalgias, morning stiffness and myalgias. Negative for muscle weakness and muscle tenderness.  Skin: Positive for rash. Negative for color change, hair loss, nodules/bumps, skin tightness, ulcers and sensitivity to  sunlight.  Allergic/Immunologic: Negative for susceptible to infections.  Neurological: Negative for dizziness, fainting, memory loss and night sweats.  Hematological: Negative for swollen glands.  Psychiatric/Behavioral: Positive for sleep disturbance. Negative for depressed mood. The patient is not nervous/anxious.        Sleep apnea on CPAP    PMFS History:    Patient Active Problem List   Diagnosis Date Noted  . Multiple joint pain 07/21/2016  . Chronic right-sided low back pain without sciatica 07/21/2016  . History of hypothyroidism 07/21/2016  . History of hypertension 07/21/2016  . History of sleep apnea 07/21/2016  . History of bilateral carpal tunnel release 07/21/2016  . History of cholecystectomy 07/21/2016  . Low testosterone 07/21/2016  . Idiopathic chronic gout of multiple sites without tophus 07/21/2016  . History of gastroesophageal reflux (GERD) 07/21/2016  . History of diverticulitis 07/21/2016  . Sinusitis, acute maxillary 10/01/2015  . Tinnitus 07/12/2015  . Superior mesenteric artery aneurysm ([place]) 07/12/2015  . Left thyroid nodule   . OSA (obstructive sleep apnea) 01/27/2013  . Hypothyroid   . Symptomatic PVCs 09/06/2012  . Dyslipidemia 09/06/2012  . BPH (benign prostatic hypertrophy) 09/06/2012  . OBESITY NOS 05/17/2006  . Essential hypertension 05/17/2006  . ALLERGIC RHINITIS 05/17/2006  . OSTEOARTHRITIS 05/17/2006  . Status post bilateral total hip replacement 05/17/2006    Past Medical History:  Diagnosis Date  . ALLERGIC RHINITIS   . CELLULITIS, LEG, RIGHT    Recurrent R hip cellulitis 1/08,3/08   . Diverticulitis   . GERD (gastroesophageal reflux disease)   . Gout   . Hashimoto's thyroiditis   . Hypertension   . Hypogonadism male    low T, a/w ED  . Hypothyroid   . Left thyroid nodule 2008   on [place], decrease size in 2011 [place]  . Migraines    Atypical and ocular  . OSTEOARTHRITIS   . Seasonal allergies     Family History  Problem Relation Age of Onset  . Arthritis Father   . Heart disease Father   . Diabetes Father   . Vascular Disease Father        AoBifem  . Arthritis Mother   . Diabetes Mother   . Multiple sclerosis Mother   . Cancer Paternal Grandmother        "GI"  . Cancer Paternal Grandfather        stomach cancer  . Thyroid cancer Sister   . Uterine cancer Sister    Past  Surgical History:  Procedure Laterality Date  . CHOLECYSTECTOMY  2004  . CTR Bilateral 1982  . CYSTOSCOPY  1974  . open reduction L little finger Left 1970  . Repair digital thumb, left Left 1990  . Right shoulder cuff repair Right 09/15/11  . Right shoulder SAD, DCR Right 02/05/11  . TOTAL HIP ARTHROPLASTY Left 07/26/06  . TOTAL HIP ARTHROPLASTY Right 1999   Social History   Social History Narrative   ortho PA, married, lives with spouse     Objective: Vital Signs: BP 119/65   Pulse (!) 56   Resp 16   Ht 5\' 8"  (1.727 m)   Wt 282 lb (127.9 kg)   BMI 42.88 kg/m    Physical Exam  Constitutional: He is oriented to person, place, and time. He appears well-developed and well-nourished.  Overweight  HENT:  Head: Normocephalic and atraumatic.  Eyes: Conjunctivae and EOM are normal. Pupils are equal, round, and reactive to light.  Neck: Normal range of motion. Neck supple.  Cardiovascular: Normal rate, regular rhythm and normal heart sounds.   Pulmonary/Chest: Effort normal and breath sounds normal.  Abdominal: Soft. Bowel sounds are normal.  Neurological: He is alert and oriented to person, place, and time.  Skin: Skin is warm and dry. Capillary refill takes less than 2 seconds. Rash noted.  Peritonitis dry patches scattered on extremities.  Psychiatric: He has a normal mood and affect. His behavior is normal.  Nursing note and vitals reviewed.    Musculoskeletal Exam: C-spine good range of motion. His thoracic kyphosis. Lumbar spine limited range of motion with some discomfort. Right shoulder joint abduction was limited. He had discomfort range of motion of bilateral shoulder joints. Elbow joints are good range of motion. He had limited range of motion of his left wrist joint is with limited extension. He has tenderness on palpation of bilateral wrist joints. Left wrist extensor Tenosynovitis was noted on examination. He has some tenderness across his MCP joints but no synovitis  was noted. He has DIP PIP thickening in his hands consistent with osteoarthritis. Hip joints with good range of motion which are replaced. Knee joints are good range of motion without any warmth swelling or effusion. He has some swelling over his left ankle joint over the lateral malleolar region. MTPs PIPs with good range of motion with no synovitis.  CDAI Exam: No CDAI exam completed.    Investigation: Findings:  Record review 10/23/2013 left SI joint injection 08/10/2012 MRI left shoulder synovitis of the glenohumeral joint with multiple loose bodies and synovial hypertrophy. Slight degenerative changes of glenohumeral joint. 09/16/2014 ultrasound bilateral hands mild synovitis over bilateral heard MCP joints right median nerve by fit 0.16 cm left median nerve by fit 0.13 cm 04/30/2016 MRI of the lumbar spine degenerative changes with posterior endplate marrow edema from L2-L4. Mild spinal and foraminal stenosis. Moderate chronic facet degeneration L5-S1 mild spinal stenosis T12-L1  11/18/2015 TB gold negative 09/29/2014 hemoglobin A1c 5.6 hep B surface antigen negative hep C antibody negative HIV negative immunoglobulins IgM low at 31 SPEP negative vitamin D 47, ANA negative, CMP normal, testosterone 249, CBC normal, lipid panel LDL 92, uric acid 5.1, TSH normal    Imaging: No results found. Ultrasound examination of bilateral hands was performed per EULAR recommendations. Using 12 MHz transducer, grayscale and power Doppler bilateral second, third, and fifth MCP joints and bilateral wrist joints both dorsal and volar aspects were evaluated to look for synovitis or tenosynovitis. The findings were there was synovitis in bilateral second third and fifth MCPs, and wrists joints. Bilateral extensor  tenosynovitis of the wrist joints was noted on ultrasound examination. Right median nerve was bifid 0.17 cm squares which was more than upper limits of normal and left median nerve was 0.17 cm  squares which was more than upper limits of normal  Impression: Ultrasound examination consistent with synovitis and tenosynovitis of the MCPs and wrist joints and enlarged bilateral median nerves were noted. Speciality Comments: No specialty comments available.    Procedures:  No procedures performed Allergies: Nsaids and Penicillins   Assessment / Plan:     Visit Diagnoses: Multiple joint pain: He has polyarthralgias involving multiple joints as described above. He has some synovitis and tenosynovitis on the ultrasound examination of bilateral hands today. He also has evidence of synovitis on prior MRI of the shoulder joint. He is having polyarthralgias with significant morning stiffness and nocturnal pain. His serology has been negative in the past. We will obtain labs again today. A detailed discussion  regarding most likely diagnosis of seronegative rheumatoid arthritis. Indications side effects contraindications of different medications were discussed at length. At this point he is inclined towards going on Plaquenil. Informed consent was obtained. I will use prednisone is a present therapy he will take prednisone 5 mg tablet 2 tablets by mouth every morning for one week, 7.5 mg for one week, 5 mg for one week and 2.5 mg for one week. He'll start Plaquenil 200 mg by mouth twice a day. We will check labs in a month then 3 months. Baseline eye exam was also advised. Patient states that he has had pneumococcal vaccine. We'll obtain AVISE labs today. His CBC and CMP was normal in February 2018.  Pain in both hands: Due to synovitis and osteoarthritis overlap.  Chronic right-sided low back pain without sciatica: He is disc disease of lumbar spine and facet joint arthropathy.  Status post bilateral total hip replacement  History of bilateral carpal tunnel release  Idiopathic chronic gout of multiple sites without tophus: He is been on Uloric and has been asymptomatic. His last uric acid few  months back was normal.  History of hypothyroidism Hashimotos  History of hypertension  History of sleep apnea  History of cholecystectomy  Low testosterone  History of gastroesophageal reflux (GERD)  History of diverticulitis    Orders: Orders Placed This Encounter  Procedures  . CBC with Differential/Platelet  . COMPLETE METABOLIC PANEL WITH GFR   Meds ordered this encounter  Medications  . predniSONE (DELTASONE) 5 MG tablet    Sig: Take 10 mg PO QD x 7 days, then 7.5 mg PO QD x 7 days, then 5 mg PO QD x 7 days, then 2.5 mg PO QD x 7 days.    Dispense:  35 tablet    Refill:  0  . hydroxychloroquine (PLAQUENIL) 200 MG tablet    Sig: Take 1 tablet (200 mg total) by mouth 2 (two) times daily.    Dispense:  60 tablet    Refill:  2    Face-to-face time spent with patient was 45 minutes. 50% of time was spent in counseling and coordination of care.  Follow-Up Instructions: Return in about 3 months (around 10/21/2016) for Inflammatory arthritis.   Bo Merino, MD  Note - This record has been created using Editor, commissioning.  Chart creation errors have been sought, but may not always  have been located. Such creation errors do not reflect on  the standard of medical care.

## 2016-07-21 NOTE — Progress Notes (Signed)
Pharmacy Note  Subjective: Patient presents today to the [facility] to see Dr. Estanislado Pandy.  Patient seen by the pharmacist for counseling on hydroxychloroquine.    Objective: CMP Latest Ref Rng & Units 04/13/2016 03/01/2016 07/12/2015  Glucose 65 - 99 mg/dL 78 92 102(H)  BUN 7 - 25 mg/dL 9 14 17   Creatinine 0.70 - 1.25 mg/dL 0.92 0.92 0.84  Sodium 135 - 146 mmol/L 141 140 139  Potassium 3.5 - 5.3 mmol/L 4.4 4.3 4.4  Chloride 98 - 110 mmol/L 104 104 104  CO2 20 - 31 mmol/L 27 25 28   Calcium 8.6 - 10.3 mg/dL 9.2 9.3 9.5  Total Protein 6.1 - 8.1 g/dL 6.6 6.9 7.0  Total Bilirubin 0.2 - 1.2 mg/dL 0.6 0.9 1.0  Alkaline Phos 40 - 115 U/L 48 46 42  AST 10 - 35 U/L 21 24 18   ALT 9 - 46 U/L 23 22 18    CBC    Component Value Date/Time   WBC 6.6 04/13/2016 1039   RBC 5.13 04/13/2016 1039   HGB 15.8 04/13/2016 1039   HCT 44.6 04/13/2016 1039   PLT 246 04/13/2016 1039   MCV 86.9 04/13/2016 1039   MCH 30.8 04/13/2016 1039   MCHC 35.4 04/13/2016 1039   RDW 13.4 04/13/2016 1039   LYMPHSABS 2,178 04/13/2016 1039   MONOABS 660 04/13/2016 1039   EOSABS 198 04/13/2016 1039   BASOSABS 0 04/13/2016 1039   Assessment/Plan: Patient was prescribed hydroxychloroquine 200 mg BID.  Patient was counseled on the purpose, proper use, and adverse effects of hydroxychloroquine including nausea/diarrhea, skin rash, headaches, and sun sensitivity.  Discussed importance of annual eye exams while on hydroxychloroquine to monitor to ocular toxicity and discussed importance of frequent laboratory monitoring.  Provided patient with eye exam form for baseline ophthalmologic exam and standing lab instructions.  Provided patient with educational materials on hydroxychloroquine and answered all questions.  Patient consented to hydroxychloroquine.  Will upload consent in the media tab.    Patient was also prescribed prednisone taper.  Counseled patient on purpose, proper use, and adverse effects of  prednisone.  Counseled him to monitor blood pressure and blood sugar closely.  Advised him to take prednisone in the morning with food.   Elisabeth Most, Pharm.D., BCPS Clinical Pharmacist Pager: [phone] Phone: [phone] 07/21/2016 2:40 PM

## 2016-07-24 ENCOUNTER — Ambulatory Visit: Payer: 59 | Admitting: Rheumatology

## 2016-07-27 MED FILL — TESTOSTERONE 30 MG/1.5 ML P: 30 | 30 days supply | Qty: 90 | Fill #1

## 2016-08-07 ENCOUNTER — Other Ambulatory Visit: Payer: Self-pay | Admitting: Internal Medicine

## 2016-08-07 DIAGNOSIS — E041 Nontoxic single thyroid nodule: Secondary | ICD-10-CM

## 2016-08-07 DIAGNOSIS — E038 Other specified hypothyroidism: Secondary | ICD-10-CM

## 2016-08-07 MED FILL — LEVOTHYROXINE 175 MCG TABLE: 175 | 90 days supply | Qty: 90 | Fill #0

## 2016-08-07 MED FILL — CICLOPIROX 8% SOLUTION: 8 | 30 days supply | Qty: 7 | Fill #0

## 2016-08-14 DIAGNOSIS — Z79899 Other long term (current) drug therapy: Secondary | ICD-10-CM | POA: Diagnosis not present

## 2016-08-15 ENCOUNTER — Other Ambulatory Visit: Payer: Self-pay | Admitting: Internal Medicine

## 2016-08-15 DIAGNOSIS — E041 Nontoxic single thyroid nodule: Secondary | ICD-10-CM

## 2016-08-15 DIAGNOSIS — E038 Other specified hypothyroidism: Secondary | ICD-10-CM

## 2016-08-16 DIAGNOSIS — G4733 Obstructive sleep apnea (adult) (pediatric): Secondary | ICD-10-CM | POA: Diagnosis not present

## 2016-08-21 MED FILL — CIALIS 5 MG TABLET: 5 | 30 days supply | Qty: 30 | Fill #3

## 2016-08-22 ENCOUNTER — Telehealth: Payer: Self-pay | Admitting: Radiology

## 2016-08-22 DIAGNOSIS — Z79899 Other long term (current) drug therapy: Secondary | ICD-10-CM

## 2016-08-22 MED ORDER — "TUBERCULIN SYRINGE 27G X 1/2"" 1 ML MISC": 1 refills | Status: DC

## 2016-08-22 MED ORDER — METHOTREXATE SODIUM CHEMO INJECTION 50 MG/2ML: INTRAMUSCULAR | 0 refills | Status: DC

## 2016-08-22 MED ORDER — FOLIC ACID 1 MG PO TABS: 2.0000 mg | ORAL_TABLET | Freq: Every day | ORAL | 3 refills | Status: DC

## 2016-08-22 MED FILL — FOLIC ACID 1 MG TABLET: 1 | 90 days supply | Qty: 180 | Fill #0

## 2016-08-22 MED FILL — METHOTREXATE 25 MG/ML VIAL: 50 | 90 days supply | Qty: 10 | Fill #0

## 2016-08-22 MED FILL — BD TB SYRINGE 27GX1/2: 27G X 1/2" | 84 days supply | Qty: 12 | Fill #0

## 2016-08-22 MED FILL — BD TB SYRINGE 27GX1/2": 27G X 1/2" | 84 days supply | Qty: 12 | Fill #0

## 2016-08-22 NOTE — Telephone Encounter (Signed)
Patient not responding to Plaquenil he has discussed with Dr Estanislado Pandy  Screening labs normal   Proceed with Methotrexate per Dr Estanislado Pandy injectable to avoid any GI side effects.    Have given patient Consent material and will proceed with Rx once signed

## 2016-08-22 NOTE — Addendum Note (Signed)
Addended byCandice Camp on: 08/22/2016 04:48 PM   Modules accepted: Orders

## 2016-08-22 NOTE — Telephone Encounter (Signed)
Patient has signed consents on Methotrexate  labs ordered  Patient voiced understanding for dosing and plan

## 2016-08-22 NOTE — Telephone Encounter (Signed)
Consent signed  Patient has discussed meds with Dr Estanislado Pandy and with me, he will begin MTX

## 2016-08-24 MED FILL — HYDROXYCHLOROQUINE 200 MG T: 200 | 13 days supply | Qty: 26 | Fill #1

## 2016-08-30 ENCOUNTER — Other Ambulatory Visit (INDEPENDENT_AMBULATORY_CARE_PROVIDER_SITE_OTHER): Payer: Self-pay | Admitting: *Deleted

## 2016-08-30 DIAGNOSIS — G8929 Other chronic pain: Secondary | ICD-10-CM

## 2016-08-30 DIAGNOSIS — M542 Cervicalgia: Secondary | ICD-10-CM

## 2016-08-30 DIAGNOSIS — M25512 Pain in left shoulder: Secondary | ICD-10-CM

## 2016-09-04 ENCOUNTER — Ambulatory Visit
Admission: RE | Admit: 2016-09-04 | Discharge: 2016-09-04 | Disposition: A | Discharge status: Home / Self Care | Payer: 59 | Source: Ambulatory Visit | Attending: Orthopaedic Surgery | Admitting: Orthopaedic Surgery

## 2016-09-04 DIAGNOSIS — G8929 Other chronic pain: Secondary | ICD-10-CM

## 2016-09-04 DIAGNOSIS — M542 Cervicalgia: Secondary | ICD-10-CM

## 2016-09-04 DIAGNOSIS — M25512 Pain in left shoulder: Principal | ICD-10-CM

## 2016-09-04 DIAGNOSIS — M50223 Other cervical disc displacement at C6-C7 level: Secondary | ICD-10-CM | POA: Diagnosis not present

## 2016-09-04 DIAGNOSIS — M4802 Spinal stenosis, cervical region: Secondary | ICD-10-CM | POA: Diagnosis not present

## 2016-09-04 DIAGNOSIS — M19012 Primary osteoarthritis, left shoulder: Secondary | ICD-10-CM | POA: Diagnosis not present

## 2016-09-08 ENCOUNTER — Other Ambulatory Visit: Payer: Self-pay | Admitting: *Deleted

## 2016-09-08 MED ORDER — PREDNISONE 1 MG PO TABS: 4.0000 mg | ORAL_TABLET | Freq: Every day | ORAL | 0 refills | Status: DC

## 2016-09-08 MED FILL — predniSONE 1 MG TABS: 1 | 30 days supply | Qty: 120 | Fill #0

## 2016-09-08 NOTE — Progress Notes (Signed)
Per Dr. Estanislado Pandy send in prescription for Prednisone 1 mg take 4 tablets in the morning 1 month supply.

## 2016-09-12 ENCOUNTER — Other Ambulatory Visit: Payer: Self-pay | Admitting: *Deleted

## 2016-09-12 DIAGNOSIS — Z79899 Other long term (current) drug therapy: Secondary | ICD-10-CM

## 2016-09-12 DIAGNOSIS — G4733 Obstructive sleep apnea (adult) (pediatric): Secondary | ICD-10-CM | POA: Diagnosis not present

## 2016-09-12 DIAGNOSIS — R300 Dysuria: Secondary | ICD-10-CM | POA: Diagnosis not present

## 2016-09-12 LAB — CBC WITH DIFFERENTIAL/PLATELET
BASOS PCT: 0 %
Basophils Absolute: 0 cells/uL (ref 0–200)
Eosinophils Absolute: 148 cells/uL (ref 15–500)
Eosinophils Relative: 2 %
HEMATOCRIT: 47.1 % (ref 38.5–50.0)
HEMOGLOBIN: 16.3 g/dL (ref 13.2–17.1)
LYMPHS ABS: 1924 {cells}/uL (ref 850–3900)
Lymphocytes Relative: 26 %
MCH: 31.3 pg (ref 27.0–33.0)
MCHC: 34.6 g/dL (ref 32.0–36.0)
MCV: 90.4 fL (ref 80.0–100.0)
MONO ABS: 518 {cells}/uL (ref 200–950)
MPV: 8.4 fL (ref 7.5–12.5)
Monocytes Relative: 7 %
NEUTROS ABS: 4810 {cells}/uL (ref 1500–7800)
Neutrophils Relative %: 65 %
PLATELETS: 266 10*3/uL (ref 140–400)
RBC: 5.21 MIL/uL (ref 4.20–5.80)
RDW: 13.9 % (ref 11.0–15.0)
WBC: 7.4 10*3/uL (ref 3.8–10.8)

## 2016-09-12 LAB — COMPLETE METABOLIC PANEL WITH GFR
ALBUMIN: 4.3 g/dL (ref 3.6–5.1)
ALT: 27 U/L (ref 9–46)
AST: 20 U/L (ref 10–35)
Alkaline Phosphatase: 52 U/L (ref 40–115)
BILIRUBIN TOTAL: 0.6 mg/dL (ref 0.2–1.2)
BUN: 15 mg/dL (ref 7–25)
CALCIUM: 9.2 mg/dL (ref 8.6–10.3)
CO2: 26 mmol/L (ref 20–31)
CREATININE: 0.91 mg/dL (ref 0.70–1.25)
Chloride: 102 mmol/L (ref 98–110)
GFR, Est Non African American: 89 mL/min (ref >=60)
Glucose, Bld: 120 mg/dL — ABNORMAL HIGH (ref 65–99)
Potassium: 4.2 mmol/L (ref 3.5–5.3)
Sodium: 139 mmol/L (ref 135–146)
TOTAL PROTEIN: 6.5 g/dL (ref 6.1–8.1)

## 2016-09-13 LAB — URINALYSIS, ROUTINE W REFLEX MICROSCOPIC
Bilirubin Urine: NEGATIVE
Glucose, UA: NEGATIVE
HGB URINE DIPSTICK: NEGATIVE
KETONES UR: NEGATIVE
Leukocytes, UA: NEGATIVE
Nitrite: NEGATIVE
Protein, ur: NEGATIVE
Specific Gravity, Urine: 1.011 (ref 1.001–1.035)
pH: 5 (ref 5.0–8.0)

## 2016-09-19 ENCOUNTER — Encounter: Payer: Self-pay | Admitting: Internal Medicine

## 2016-09-20 ENCOUNTER — Other Ambulatory Visit: Payer: Self-pay | Admitting: Internal Medicine

## 2016-09-20 DIAGNOSIS — I10 Essential (primary) hypertension: Secondary | ICD-10-CM

## 2016-09-20 MED ORDER — TELMISARTAN 40 MG PO TABS: 40.0000 mg | ORAL_TABLET | Freq: Every day | ORAL | 0 refills | Status: DC

## 2016-09-20 MED FILL — TELMISARTAN 40 MG TABLET: 40 | 90 days supply | Qty: 90 | Fill #0

## 2016-09-25 MED FILL — CIALIS 5 MG TABLET: 5 | 30 days supply | Qty: 30 | Fill #4

## 2016-09-26 ENCOUNTER — Other Ambulatory Visit: Payer: Self-pay | Admitting: *Deleted

## 2016-09-26 ENCOUNTER — Other Ambulatory Visit: Payer: Self-pay

## 2016-09-26 DIAGNOSIS — M25552 Pain in left hip: Secondary | ICD-10-CM

## 2016-09-27 LAB — HEPATITIS PANEL, ACUTE
HCV Ab: NONREACTIVE
HEP B C IGM: NONREACTIVE
HEP B S AG: NONREACTIVE
Hep A IgM: NONREACTIVE

## 2016-09-27 MED FILL — TESTOSTERONE 30 MG/1.5 ML P: 30 | 30 days supply | Qty: 90 | Fill #2

## 2016-09-27 NOTE — Progress Notes (Signed)
WNL

## 2016-09-28 LAB — COBALT, SERUM/PLASMA: COBALT, SERUM/PLASMA: 0.4 ug/L (ref 0.1–0.4)

## 2016-09-29 LAB — CHROMIUM LEVEL: Chromium: <0.5 mcg/L (ref <=1.2)

## 2016-09-29 NOTE — Progress Notes (Signed)
WNLs

## 2016-09-30 LAB — HEPATITIS C RNA QUANTITATIVE
HCV Quantitative Log: <1.18 Log IU/mL
HCV Quantitative: <15 IU/mL

## 2016-10-04 ENCOUNTER — Telehealth: Payer: Self-pay | Admitting: Radiology

## 2016-10-04 NOTE — Telephone Encounter (Signed)
Patient needs his office visit note / he has discussed this with Dr Estanislado Pandy, and she has approved for him to have his office note from June, I have printed it for him.  He has requested office notes from other offices for past three years. Unfortunately, I can not provide these for him, I have provided him a medical records release form.

## 2016-10-12 ENCOUNTER — Encounter: Payer: Self-pay | Admitting: Internal Medicine

## 2016-10-12 ENCOUNTER — Ambulatory Visit (INDEPENDENT_AMBULATORY_CARE_PROVIDER_SITE_OTHER): Payer: 59 | Admitting: Internal Medicine

## 2016-10-12 VITALS — BP 128/74 | HR 65 | Temp 98.2°F | Resp 16 | Ht 68.0 in | Wt 283.0 lb

## 2016-10-12 DIAGNOSIS — Z0000 Encounter for general adult medical examination without abnormal findings: Secondary | ICD-10-CM

## 2016-10-12 DIAGNOSIS — I10 Essential (primary) hypertension: Secondary | ICD-10-CM

## 2016-10-12 DIAGNOSIS — E039 Hypothyroidism, unspecified: Secondary | ICD-10-CM

## 2016-10-12 NOTE — Progress Notes (Signed)
Subjective:  Patient ID: Jonathan Powers, male    DOB: Apr 01, 1950  Age: 66 y.o. MRN: [id number]  CC: Hypertension   HPI Jonathan Powers presents for a BP check - he feels well, offers no complaints, and says his BP has been well controlled.  Outpatient Medications Prior to Visit  Medication Sig Dispense Refill  . acetaminophen (TYLENOL) 500 MG tablet Take 1,000 mg by mouth 2 (two) times daily.    . cetirizine (ZYRTEC) 10 MG tablet Take 10 mg by mouth daily.    . Cholecalciferol (VITAMIN D-3) 5000 UNITS TABS Take by mouth. Takes Monday thru friday    . ciclopirox (PENLAC) 8 % solution APPLY OVER NAIL & SURROUNDING SKIN, APPLY DAILY OVER PREVIOUS COAT. AFTER 7 DAYS, MAY REMOVE WITH ALCOHOL & CONTINUE CYCLE 6.6 mL 0  . diclofenac sodium (VOLTAREN) 1 % GEL Apply 2 g topically 4 (four) times daily as needed (pain).     . Esomeprazole Magnesium (NEXIUM 24HR PO) Take 1 tablet by mouth daily as needed.     . febuxostat (ULORIC) 40 MG tablet Take 40 mg by mouth daily.     . hydroxychloroquine (PLAQUENIL) 200 MG tablet Take 1 tablet (200 mg total) by mouth 2 (two) times daily. 60 tablet 2  . levothyroxine (SYNTHROID, LEVOTHROID) 175 MCG tablet TAKE 1 TABLET BY MOUTH ONCE DAILY BEFORE BREAKFAST. 90 tablet 1  . predniSONE (DELTASONE) 1 MG tablet Take 4 tablets (4 mg total) by mouth daily with breakfast. 120 tablet 0  . predniSONE (DELTASONE) 5 MG tablet Take 10 mg PO QD x 7 days, then 7.5 mg PO QD x 7 days, then 5 mg PO QD x 7 days, then 2.5 mg PO QD x 7 days. 35 tablet 0  . tadalafil (CIALIS) 5 MG tablet Take 1 tablet (5 mg total) by mouth daily as needed for erectile dysfunction. 18 tablet 0  . telmisartan (MICARDIS) 40 MG tablet Take 1 tablet (40 mg total) by mouth daily. 90 tablet 0  . Testosterone 30 MG/ACT SOLN   5  . traZODone (DESYREL) 100 MG tablet TAKE 1/2-1 TABLET BY MOUTH AT BEDTIME AS NEEDED FOR SLEEP. 90 tablet 3  . triamcinolone cream (KENALOG) 0.1 %     . TUBERCULIN SYR  1CC/27GX1/2" (B-D TB SYRINGE 1CC/27GX1/2") 27G X 1/2" 1 ML MISC To use with injectable methotrexate once a week 100 each 1  . valACYclovir (VALTREX) 1000 MG tablet   11  . folic acid (FOLVITE) 1 MG tablet Take 2 tablets (2 mg total) by mouth daily. (Patient not taking: Reported on 10/12/2016) 180 tablet 3  . methotrexate 50 MG/2ML injection Inject 0.6 mL sub q once weekly x 2 weeks labs, then increase to 0.96mL sub q (Patient not taking: Reported on 10/12/2016) 10 mL 0   No facility-administered medications prior to visit.     ROS Review of Systems  Constitutional: Negative.  Negative for diaphoresis, fatigue and unexpected weight change.  HENT: Negative.   Eyes: Negative.   Respiratory: Negative.  Negative for chest tightness, shortness of breath and wheezing.   Cardiovascular: Negative for chest pain, palpitations and leg swelling.  Gastrointestinal: Negative for abdominal pain, constipation, diarrhea, nausea and vomiting.  Endocrine: Negative.   Genitourinary: Negative.  Negative for difficulty urinating, dysuria and hematuria.  Musculoskeletal: Negative for back pain and neck pain.  Skin: Negative.  Negative for color change.  Allergic/Immunologic: Negative.   Neurological: Negative.  Negative for dizziness, weakness, numbness and headaches.  Hematological:  Negative for adenopathy. Does not bruise/bleed easily.  Psychiatric/Behavioral: Negative.     Objective:  BP 128/74 (BP Location: Left Arm, Patient Position: Sitting, Cuff Size: Large)   Pulse 65   Temp 98.2 F (36.8 C) (Other (Comment))   Resp 16   Ht 5\' 8"  (1.727 m)   Wt 283 lb (128.4 kg)   SpO2 96%   BMI 43.03 kg/m   BP Readings from Last 3 Encounters:  10/12/16 128/74  07/21/16 119/65  02/07/16 (!) 116/58    Wt Readings from Last 3 Encounters:  10/12/16 283 lb (128.4 kg)  07/21/16 282 lb (127.9 kg)  02/07/16 286 lb (129.7 kg)    Physical Exam  Constitutional: He is oriented to person, place, and time. No  distress.  HENT:  Mouth/Throat: Oropharynx is clear and moist. No oropharyngeal exudate.  Eyes: Conjunctivae are normal. Right eye exhibits no discharge. Left eye exhibits no discharge. No scleral icterus.  Neck: Normal range of motion. Neck supple. No JVD present. No thyromegaly present.  Cardiovascular: Normal rate, regular rhythm and intact distal pulses.  Exam reveals no gallop and no friction rub.   No murmur heard. Pulmonary/Chest: Effort normal and breath sounds normal. No respiratory distress. He has no wheezes. He has no rales. He exhibits no tenderness.  Abdominal: Soft. Bowel sounds are normal. He exhibits no distension and no mass. There is no tenderness. There is no rebound and no guarding.  Musculoskeletal: Normal range of motion. He exhibits no edema, tenderness or deformity.  Lymphadenopathy:    He has no cervical adenopathy.  Neurological: He is alert and oriented to person, place, and time.  Skin: Skin is warm and dry. No rash noted. He is not diaphoretic. No erythema. No pallor.  Vitals reviewed.   Lab Results  Component Value Date   WBC 7.4 09/12/2016   HGB 16.3 09/12/2016   HCT 47.1 09/12/2016   PLT 266 09/12/2016   GLUCOSE 120 (H) 09/12/2016   CHOL 156 02/07/2016   TRIG 140.0 02/07/2016   HDL 44.90 02/07/2016   LDLCALC 83 02/07/2016   ALT 27 09/12/2016   AST 20 09/12/2016   NA 139 09/12/2016   K 4.2 09/12/2016   CL 102 09/12/2016   CREATININE 0.91 09/12/2016   BUN 15 09/12/2016   CO2 26 09/12/2016   TSH 0.86 03/01/2016   PSA 0.68 11/19/2015   INR 2.4 (H) 07/29/2006   HGBA1C 5.6 09/29/2014    Mr Cervical Spine W/o Contrast  Result Date: 09/04/2016 CLINICAL DATA:  LEFT shoulder pain. Chronic neck pain. Numbness down LEFT arm for 1 month. Numbness extends to middle finger. EXAM: MRI CERVICAL SPINE WITHOUT CONTRAST TECHNIQUE: Multiplanar, multisequence MR imaging of the cervical spine was performed. No intravenous contrast was administered. COMPARISON:   02/19/2013. FINDINGS: Alignment: Physiologic.Bold Vertebrae: No fracture, evidence of discitis, or bone lesion. Endplate reactive changes above and below C4-5, C5-6, and C6-7. Cord: Normal signal and morphology. Posterior Fossa, vertebral arteries, paraspinal tissues: Negative. Disc levels: C2-3:  Normal disc space. C3-4:  Annular bulge.  Mild facet arthropathy.  No impingement. C4-5: Disc space narrowing. Facet arthropathy. Annular bulge. Disc osteophyte complex. No central canal stenosis. BILATERAL RIGHT greater than LEFT C5 foraminal narrowing. C5-6: Disc space narrowing. Annular bulge. Osseous spurring. Mild stenosis. BILATERAL C6 foraminal narrowing slightly worse on the LEFT. C6-7: Disc space narrowing. Annular bulge. Osseous spurring eccentric to the LEFT. There may be a small soft disc protrusion or uncinate spur in the foramen, see series 6, image  11, axial image 26 series 8. C7-T1:  Unremarkable. Compared with 2014, there is more disc space narrowing at C4-5, as well as more endplate reactive changes from C4 through C7. IMPRESSION: Mild progression of spondylosis since 2014. Disc space narrowing with potentially symptomatic neural impingement at C4-5, C5-6, and C6-7, as described above. With regard to the distribution of LEFT-sided numbness, with middle finger usually representing C7 distribution, the LEFT foraminal narrowing at C6-7 does not appear as severe as that at C4-5 and C5-6. If further investigation desired, cervical myelogram and postmyelogram CT could be performed for further evaluation. Electronically Signed   By: Staci Righter M.D.   On: 09/04/2016 08:34   Mr Shoulder Left W/o Contrast  Result Date: 09/04/2016 CLINICAL DATA:  Chronic neck pain with left shoulder pain and numbness down the left arm EXAM: MRI OF THE LEFT SHOULDER WITHOUT CONTRAST TECHNIQUE: Multiplanar, multisequence MR imaging of the shoulder was performed. No intravenous contrast was administered. COMPARISON:  08/09/2012  FINDINGS: Rotator cuff: Mild tendinosis of the supraspinatus tendon with a small partial-thickness articular surface tear anteriorly. Infraspinatus tendon is intact. Teres minor tendon is intact. Subscapularis tendon is intact. Muscles: No atrophy or fatty replacement of nor abnormal signal within, the muscles of the rotator cuff. Biceps long head: Severe tendinosis of the intraarticular portion of the long head of the biceps tendon with a small partial tear of the proximal extra-articular portion of the long head of the biceps tendon. Acromioclavicular Joint: Moderate arthropathy of the acromioclavicular joint. Type I acromion. Trace subacromial/ subdeltoid bursal fluid. Glenohumeral Joint: No joint effusion. Mild partial-thickness cartilage loss of the glenohumeral joint. Labrum:  Superior posterior labral degeneration. Bones:  No marrow signal abnormality.  No fracture or dislocation. Other: No fluid collection or hematoma. IMPRESSION: 1. Severe tendinosis of the intraarticular portion of the long head of the biceps tendon with a small partial tear of the proximal extra-articular portion of the long head of the biceps tendon. 2. Mild tendinosis of the supraspinatus tendon with a small partial-thickness articular surface tear anteriorly. 3. Mild osteoarthritis of the left glenohumeral joint. Electronically Signed   By: Kathreen Devoid   On: 09/04/2016 08:45    Assessment & Plan:   Kamrin was seen today for hypertension.  Diagnoses and all orders for this visit:  Acquired hypothyroidism  Routine general medical examination at a health care facility  Essential hypertension- his BP is well controlled, will cont the ARB at the current dose   I am having Jonathan Powers maintain his diclofenac sodium, cetirizine, Esomeprazole Magnesium (NEXIUM 24HR PO), febuxostat, acetaminophen, Vitamin D-3, tadalafil, valACYclovir, traZODone, Testosterone, triamcinolone cream, predniSONE, hydroxychloroquine, ciclopirox,  levothyroxine, methotrexate, TUBERCULIN SYR 0SP/23RA0/7", folic acid, predniSONE, and telmisartan.  No orders of the defined types were placed in this encounter.    Follow-up: No Follow-up on file.  Scarlette Calico, MD

## 2016-10-13 NOTE — Patient Instructions (Signed)

## 2016-10-26 MED FILL — TESTOSTERONE 30 MG/1.5 ML P: 30 | 30 days supply | Qty: 90 | Fill #3

## 2016-10-26 MED FILL — CIALIS 5 MG TABLET: 5 | 30 days supply | Qty: 30 | Fill #5

## 2016-10-31 MED FILL — HYDROXYCHLOROQUINE 200 MG T: 200 | 30 days supply | Qty: 60 | Fill #2

## 2016-11-06 MED FILL — LEVOTHYROXINE 175 MCG TABLE: 175 | 90 days supply | Qty: 90 | Fill #0

## 2016-11-06 MED FILL — traZODone HCL 100 MG TABS: 100 | 90 days supply | Qty: 90 | Fill #3

## 2016-11-14 DIAGNOSIS — G4733 Obstructive sleep apnea (adult) (pediatric): Secondary | ICD-10-CM | POA: Diagnosis not present

## 2016-11-22 MED FILL — CIALIS 5 MG TABLET: 5 | 30 days supply | Qty: 30 | Fill #0

## 2016-12-04 DIAGNOSIS — K642 Third degree hemorrhoids: Secondary | ICD-10-CM | POA: Diagnosis not present

## 2016-12-04 MED FILL — ANUSOL-HC 25 MG SUPPOSITORY: 25 | 10 days supply | Qty: 20 | Fill #0

## 2016-12-11 ENCOUNTER — Ambulatory Visit (INDEPENDENT_AMBULATORY_CARE_PROVIDER_SITE_OTHER): Payer: 59 | Admitting: Physical Medicine and Rehabilitation

## 2016-12-11 DIAGNOSIS — R202 Paresthesia of skin: Secondary | ICD-10-CM | POA: Diagnosis not present

## 2016-12-11 NOTE — Progress Notes (Deleted)
0

## 2016-12-12 MED FILL — TESTOSTERONE 30 MG/1.5 ML P: 30 | 30 days supply | Qty: 90 | Fill #4

## 2016-12-13 ENCOUNTER — Encounter (INDEPENDENT_AMBULATORY_CARE_PROVIDER_SITE_OTHER): Payer: Self-pay | Admitting: Physical Medicine and Rehabilitation

## 2016-12-13 DIAGNOSIS — E291 Testicular hypofunction: Secondary | ICD-10-CM | POA: Diagnosis not present

## 2016-12-13 DIAGNOSIS — N5201 Erectile dysfunction due to arterial insufficiency: Secondary | ICD-10-CM | POA: Diagnosis not present

## 2016-12-13 DIAGNOSIS — Z125 Encounter for screening for malignant neoplasm of prostate: Secondary | ICD-10-CM | POA: Diagnosis not present

## 2016-12-13 NOTE — Procedures (Signed)
EMG & NCV Findings: Evaluation of the left median motor nerve showed prolonged distal onset latency (7.2 ms), reduced amplitude (2.6 mV), and decreased conduction velocity (Elbow-Wrist, 45 m/s).  The left median (across palm) sensory nerve showed prolonged distal peak latency (Wrist, 6.5 ms), reduced amplitude (7.9 V), and prolonged distal peak latency (Palm, 2.1 ms).  All remaining nerves (as indicated in the following tables) were within normal limits.    Needle evaluation of the left abductor pollicis brevis muscle showed moderately increased spontaneous activity, increased motor unit amplitude, and diminished recruitment.  All remaining muscles (as indicated in the following table) showed no evidence of electrical instability.    Impression: The above electrodiagnostic study is ABNORMAL and reveals evidence of a severe left median nerve entrapment at the wrist (carpal tunnel syndrome) affecting sensory and motor components.   There is no significant electrodiagnostic evidence of any other focal nerve entrapment, brachial plexopathy or cervical radiculopathy.    As you know, this particular electrodiagnostic study cannot rule out chemical radiculitis or sensory only radiculopathy.  Recommendations: 1.  Follow-up with referring physician. 2.  Continue current management of symptoms. 3.  Suggest surgical evaluation.   Nerve Conduction Studies Anti Sensory Summary Table   Stim Site NR Peak (ms) Norm Peak (ms) P-T Amp (V) Norm P-T Amp Site1 Site2 Delta-P (ms) Dist (cm) Vel (m/s) Norm Vel (m/s)  Left Median Acr Palm Anti Sensory (2nd Digit)  32C  Wrist    *6.5 <3.6 *7.9 >10 Wrist Palm 4.4 0.0    Palm    *2.1 <2.0 10.7         Left Radial Anti Sensory (Base 1st Digit)  31.8C  Wrist    2.2 <3.1 32.1  Wrist Base 1st Digit 2.2 0.0    Left Ulnar Anti Sensory (5th Digit)  32.1C  Wrist    3.6 <3.7 21.3 >15.0 Wrist 5th Digit 3.6 14.0 39 >38   Motor Summary Table   Stim Site NR Onset (ms)  Norm Onset (ms) O-P Amp (mV) Norm O-P Amp Site1 Site2 Delta-0 (ms) Dist (cm) Vel (m/s) Norm Vel (m/s)  Left Median Motor (Abd Poll Brev)  31.9C  Wrist    *7.2 <4.2 *2.6 >5 Elbow Wrist 5.2 23.5 *45 >50  Elbow    12.4  2.7         Left Ulnar Motor (Abd Dig Min)  31.9C  Wrist    3.4 <4.2 3.7 >3 B Elbow Wrist 3.4 22.0 65 >53  B Elbow    6.8  7.3  A Elbow B Elbow 1.9 10.0 53 >53  A Elbow    8.7  7.0          EMG   Side Muscle Nerve Root Ins Act Fibs Psw Amp Dur Poly Recrt Int Fraser Din Comment  Left 1stDorInt Ulnar C8-T1 Nml Nml Nml Nml Nml 0 Nml Nml   Left Abd Poll Brev Median C8-T1 Nml *2+ *2+ *Incr Nml 0 *Reduced Nml   Left ExtDigCom   Nml Nml Nml Nml Nml 0 Nml Nml   Left Triceps Radial C6-7-8 Nml Nml Nml Nml Nml 0 Nml Nml   Left Deltoid Axillary C5-6 Nml Nml Nml Nml Nml 0 Nml Nml     Nerve Conduction Studies Anti Sensory Left/Right Comparison   Stim Site L Lat (ms) R Lat (ms) L-R Lat (ms) L Amp (V) R Amp (V) L-R Amp (%) Site1 Site2 L Vel (m/s) R Vel (m/s) L-R Vel (m/s)  Median Acr Palm Anti  Sensory (2nd Digit)  32C  Wrist *6.5   *7.9   Wrist Palm     Palm *2.1   10.7         Radial Anti Sensory (Base 1st Digit)  31.8C  Wrist 2.2   32.1   Wrist Base 1st Digit     Ulnar Anti Sensory (5th Digit)  32.1C  Wrist 3.6   21.3   Wrist 5th Digit 39     Motor Left/Right Comparison   Stim Site L Lat (ms) R Lat (ms) L-R Lat (ms) L Amp (mV) R Amp (mV) L-R Amp (%) Site1 Site2 L Vel (m/s) R Vel (m/s) L-R Vel (m/s)  Median Motor (Abd Poll Brev)  31.9C  Wrist *7.2   *2.6   Elbow Wrist *45    Elbow 12.4   2.7         Ulnar Motor (Abd Dig Min)  31.9C  Wrist 3.4   3.7   B Elbow Wrist 65    B Elbow 6.8   7.3   A Elbow B Elbow 53    A Elbow 8.7   7.0            Waveforms:

## 2016-12-13 NOTE — Progress Notes (Signed)
Jonathan Powers - 66 y.o. male MRN [id number]  Date of birth: August 04, 1950  Office Visit Note: Visit Date: 12/11/2016 PCP: [name], MD Referred by: [name], MD  Subjective: Chief Complaint  Patient presents with  . Left Hand - Pain, Numbness   HPI: Jonathan Powers is a 66 year old right-handed several month history of left hand pain with numbness and tingling particularly in the radial first 3 digits.  He is symptomatic with certain positions and worse with nocturnal symptoms.  He does have a history of neck pain what was felt to be radicular.  He has had a cervical MRI performed fairly recently and this is reviewed below.  He has had prior bilateral carpal tunnel release but this was approximately 20 years ago.  He is here today for electrodiagnostic study of the left upper extremity.    ROS Otherwise per HPI.  Assessment & Plan: Visit Diagnoses:  1. Paresthesia of skin     Plan: No additional findings.  Impression: The above electrodiagnostic study is ABNORMAL and reveals evidence of a severe left median nerve entrapment at the wrist (carpal tunnel syndrome) affecting sensory and motor components.   There is no significant electrodiagnostic evidence of any other focal nerve entrapment, brachial plexopathy or cervical radiculopathy.    As you know, this particular electrodiagnostic study cannot rule out chemical radiculitis or sensory only radiculopathy.  Recommendations: 1.  Follow-up with referring physician. 2.  Continue current management of symptoms. 3.  Suggest surgical evaluation.   Meds & Orders: No orders of the defined types were placed in this encounter.   Orders Placed This Encounter  Procedures  . NCV with EMG (electromyography)    Follow-up: Return if symptoms worsen or fail to improve.   Procedures: No procedures performed  EMG & NCV Findings: Evaluation of the left median motor nerve showed prolonged distal onset latency (7.2 ms), reduced  amplitude (2.6 mV), and decreased conduction velocity (Elbow-Wrist, 45 m/s).  The left median (across palm) sensory nerve showed prolonged distal peak latency (Wrist, 6.5 ms), reduced amplitude (7.9 V), and prolonged distal peak latency (Palm, 2.1 ms).  All remaining nerves (as indicated in the following tables) were within normal limits.    Needle evaluation of the left abductor pollicis brevis muscle showed moderately increased spontaneous activity, increased motor unit amplitude, and diminished recruitment.  All remaining muscles (as indicated in the following table) showed no evidence of electrical instability.    Impression: The above electrodiagnostic study is ABNORMAL and reveals evidence of a severe left median nerve entrapment at the wrist (carpal tunnel syndrome) affecting sensory and motor components.   There is no significant electrodiagnostic evidence of any other focal nerve entrapment, brachial plexopathy or cervical radiculopathy.    As you know, this particular electrodiagnostic study cannot rule out chemical radiculitis or sensory only radiculopathy.  Recommendations: 1.  Follow-up with referring physician. 2.  Continue current management of symptoms. 3.  Suggest surgical evaluation.   Nerve Conduction Studies Anti Sensory Summary Table   Stim Site NR Peak (ms) Norm Peak (ms) P-T Amp (V) Norm P-T Amp Site1 Site2 Delta-P (ms) Dist (cm) Vel (m/s) Norm Vel (m/s)  Left Median Acr Palm Anti Sensory (2nd Digit)  32C  Wrist    *6.5 <3.6 *7.9 >10 Wrist Palm 4.4 0.0    Palm    *2.1 <2.0 10.7         Left Radial Anti Sensory (Base 1st Digit)  31.8C  Wrist  2.2 <3.1 32.1  Wrist Base 1st Digit 2.2 0.0    Left Ulnar Anti Sensory (5th Digit)  32.1C  Wrist    3.6 <3.7 21.3 >15.0 Wrist 5th Digit 3.6 14.0 39 >38   Motor Summary Table   Stim Site NR Onset (ms) Norm Onset (ms) O-P Amp (mV) Norm O-P Amp Site1 Site2 Delta-0 (ms) Dist (cm) Vel (m/s) Norm Vel (m/s)  Left Median  Motor (Abd Poll Brev)  31.9C  Wrist    *7.2 <4.2 *2.6 >5 Elbow Wrist 5.2 23.5 *45 >50  Elbow    12.4  2.7         Left Ulnar Motor (Abd Dig Min)  31.9C  Wrist    3.4 <4.2 3.7 >3 B Elbow Wrist 3.4 22.0 65 >53  B Elbow    6.8  7.3  A Elbow B Elbow 1.9 10.0 53 >53  A Elbow    8.7  7.0          EMG   Side Muscle Nerve Root Ins Act Fibs Psw Amp Dur Poly Recrt Int Fraser Din Comment  Left 1stDorInt Ulnar C8-T1 Nml Nml Nml Nml Nml 0 Nml Nml   Left Abd Poll Brev Median C8-T1 Nml *2+ *2+ *Incr Nml 0 *Reduced Nml   Left ExtDigCom   Nml Nml Nml Nml Nml 0 Nml Nml   Left Triceps Radial C6-7-8 Nml Nml Nml Nml Nml 0 Nml Nml   Left Deltoid Axillary C5-6 Nml Nml Nml Nml Nml 0 Nml Nml     Nerve Conduction Studies Anti Sensory Left/Right Comparison   Stim Site L Lat (ms) R Lat (ms) L-R Lat (ms) L Amp (V) R Amp (V) L-R Amp (%) Site1 Site2 L Vel (m/s) R Vel (m/s) L-R Vel (m/s)  Median Acr Palm Anti Sensory (2nd Digit)  32C  Wrist *6.5   *7.9   Wrist Palm     Palm *2.1   10.7         Radial Anti Sensory (Base 1st Digit)  31.8C  Wrist 2.2   32.1   Wrist Base 1st Digit     Ulnar Anti Sensory (5th Digit)  32.1C  Wrist 3.6   21.3   Wrist 5th Digit 39     Motor Left/Right Comparison   Stim Site L Lat (ms) R Lat (ms) L-R Lat (ms) L Amp (mV) R Amp (mV) L-R Amp (%) Site1 Site2 L Vel (m/s) R Vel (m/s) L-R Vel (m/s)  Median Motor (Abd Poll Brev)  31.9C  Wrist *7.2   *2.6   Elbow Wrist *45    Elbow 12.4   2.7         Ulnar Motor (Abd Dig Min)  31.9C  Wrist 3.4   3.7   B Elbow Wrist 65    B Elbow 6.8   7.3   A Elbow B Elbow 53    A Elbow 8.7   7.0            Waveforms:            Clinical History: MRI CERVICAL SPINE WITHOUT CONTRAST  TECHNIQUE: Multiplanar, multisequence MR imaging of the cervical spine was performed. No intravenous contrast was administered.  COMPARISON:  02/19/2013.  FINDINGS: Alignment: Physiologic.Bold  Vertebrae: No fracture, evidence of discitis, or bone  lesion. Endplate reactive changes above and below C4-5, C5-6, and C6-7.  Cord: Normal signal and morphology.  Posterior Fossa, vertebral arteries, paraspinal tissues: Negative.  Disc levels:  C2-3:  Normal disc  space.  C3-4:  Annular bulge.  Mild facet arthropathy.  No impingement.  C4-5: Disc space narrowing. Facet arthropathy. Annular bulge. Disc osteophyte complex. No central canal stenosis. BILATERAL RIGHT greater than LEFT C5 foraminal narrowing.  C5-6: Disc space narrowing. Annular bulge. Osseous spurring. Mild stenosis. BILATERAL C6 foraminal narrowing slightly worse on the LEFT.  C6-7: Disc space narrowing. Annular bulge. Osseous spurring eccentric to the LEFT. There may be a small soft disc protrusion or uncinate spur in the foramen, see series 6, image 11, axial image 26 series 8.  C7-T1:  Unremarkable.  Compared with 2014, there is more disc space narrowing at C4-5, as well as more endplate reactive changes from C4 through C7.  IMPRESSION: Mild progression of spondylosis since 2014. Disc space narrowing with potentially symptomatic neural impingement at C4-5, C5-6, and C6-7, as described above.  With regard to the distribution of LEFT-sided numbness, with middle finger usually representing C7 distribution, the LEFT foraminal narrowing at C6-7 does not appear as severe as that at C4-5 and C5-6. If further investigation desired, cervical myelogram and postmyelogram CT could be performed for further evaluation.   Electronically Signed   By: Staci Righter M.D.   On: 09/04/2016 08:34  He reports that he has never smoked. He has never used smokeless tobacco. No results for input(s): HGBA1C, LABURIC in the last 8760 hours.  Objective:  VS:  HT:    WT:   BMI:     BP:   HR: bpm  TEMP: ( )  RESP:  Physical Exam  Musculoskeletal:  Inspection reveals well-healed bilateral carpal tunnel release scars and flattening of the left APB but no atrophy of  the bilateral FDI or hand intrinsics. There is no swelling, color changes, allodynia or dystrophic changes. There is 5 out of 5 strength in the bilateral wrist extension, finger abduction and long finger flexion.  There is some decreased sensation to light touch in the median nerve distribution on the left.      Ortho Exam Imaging: No results found.  Past Medical/Family/Surgical/Social History: Medications & Allergies reviewed per EMR Patient Active Problem List   Diagnosis Date Noted  . Routine general medical examination at a health care facility 10/12/2016  . Chronic right-sided low back pain without sciatica 07/21/2016  . Low testosterone 07/21/2016  . Idiopathic chronic gout of multiple sites without tophus 07/21/2016  . Superior mesenteric artery aneurysm ([place]) 07/12/2015  . Left thyroid nodule   . OSA (obstructive sleep apnea) 01/27/2013  . Hypothyroid   . Symptomatic PVCs 09/06/2012  . Dyslipidemia 09/06/2012  . BPH (benign prostatic hypertrophy) 09/06/2012  . OBESITY NOS 05/17/2006  . Essential hypertension 05/17/2006  . ALLERGIC RHINITIS 05/17/2006  . OSTEOARTHRITIS 05/17/2006   Past Medical History:  Diagnosis Date  . ALLERGIC RHINITIS   . CELLULITIS, LEG, RIGHT    Recurrent R hip cellulitis 1/08,3/08   . Diverticulitis   . GERD (gastroesophageal reflux disease)   . Gout   . Hashimoto's thyroiditis   . Hypertension   . Hypogonadism male    low T, a/w ED  . Hypothyroid   . Left thyroid nodule 2008   on [place], decrease size in 2011 [place]  . Migraines    Atypical and ocular  . OSTEOARTHRITIS   . Seasonal allergies    Family History  Problem Relation Age of Onset  . Arthritis Father   . Heart disease Father   . Diabetes Father   . Vascular Disease Father  AoBifem  . Arthritis Mother   . Diabetes Mother   . Multiple sclerosis Mother   . Cancer Paternal Grandmother        "GI"  . Cancer Paternal Grandfather        stomach cancer  . Thyroid cancer  Sister   . Uterine cancer Sister    Past Surgical History:  Procedure Laterality Date  . CHOLECYSTECTOMY  2004  . CTR Bilateral 1982  . CYSTOSCOPY  1974  . open reduction L little finger Left 1970  . Repair digital thumb, left Left 1990  . Right shoulder cuff repair Right 09/15/11  . Right shoulder SAD, DCR Right 02/05/11  . TOTAL HIP ARTHROPLASTY Left 07/26/06  . 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 Smoking status: Never Smoker  . Smokeless tobacco: Never Used  . Alcohol use 0.0 oz/week     Comment: rare  . Drug use: No  . Sexual activity: Not on file

## 2016-12-18 MED FILL — HYDROXYCHLOROQUINE 200 MG T: 200 | 17 days supply | Qty: 34 | Fill #3

## 2016-12-19 ENCOUNTER — Encounter: Payer: Self-pay | Admitting: Rheumatology

## 2016-12-19 ENCOUNTER — Telehealth: Payer: Self-pay | Admitting: Rheumatology

## 2016-12-19 MED ORDER — DICLOFENAC SODIUM 1 % TD GEL: TRANSDERMAL | 12 refills | Status: DC

## 2016-12-19 MED FILL — DICLOFENAC SODIUM 1% GEL: 1 | 62 days supply | Qty: 1000 | Fill #0

## 2016-12-19 NOTE — Telephone Encounter (Signed)
Prescription for Voltaren Gel sent to the pharmacy

## 2016-12-20 ENCOUNTER — Other Ambulatory Visit: Payer: Self-pay | Admitting: Internal Medicine

## 2016-12-20 ENCOUNTER — Encounter: Payer: Self-pay | Admitting: Internal Medicine

## 2016-12-20 DIAGNOSIS — I10 Essential (primary) hypertension: Secondary | ICD-10-CM

## 2016-12-20 MED FILL — HEMMOREX-HC 25 MG SUPP: 25 | 10 days supply | Qty: 20 | Fill #1

## 2016-12-20 MED FILL — TELMISARTAN 40 MG TABLET: 40 | 90 days supply | Qty: 90 | Fill #0

## 2016-12-20 MED FILL — TADALAFIL 5 MG TABS: 5 | 30 days supply | Qty: 30 | Fill #0

## 2016-12-25 ENCOUNTER — Ambulatory Visit: Payer: 59 | Admitting: Sports Medicine

## 2016-12-25 ENCOUNTER — Encounter: Payer: Self-pay | Admitting: Sports Medicine

## 2016-12-25 VITALS — BP 130/78 | Ht 71.0 in | Wt 285.0 lb

## 2016-12-25 DIAGNOSIS — S96812A Strain of other specified muscles and tendons at ankle and foot level, left foot, initial encounter: Secondary | ICD-10-CM | POA: Diagnosis not present

## 2016-12-25 DIAGNOSIS — S86112A Strain of other muscle(s) and tendon(s) of posterior muscle group at lower leg level, left leg, initial encounter: Secondary | ICD-10-CM

## 2016-12-25 NOTE — Progress Notes (Signed)
   Subjective:    Patient ID: Jonathan Powers, male    DOB: 1950/04/19, 66 y.o.   MRN: [id number]  HPI chief complaint: Left ankle pain  Jonathan Powers comes in today complaining of chronic left ankle pain. He has a history of a posterior tibialis tendon rupture diagnosed via MRI in March 2018. He had an acute episode of medial ankle pain about a month ago. He describes a pulling and burning sensation along the medial ankle and ever since then his pain has worsened. He is here today in hopes that a new custom orthotic might help some. He is trying to get scheduled for surgery to get his ankle fused.  Past medical history reviewed Medications reviewed Allergies reviewed    Review of Systems As above    Objective:   Physical Exam  Obese. No acute distress  Left ankle: Complete collapse of the medial ankle with standing. "Too many toes sign" present. He has diffuse swelling around the ankle. Some tenderness along the posterior tibialis tendon. Significant pronation with walking. Walks with a limp.  MRI of the left ankle shows degenerative changes of the ankle in addition to the posterior tibialis tendon rupture Bedside ultrasound today also shows a stump sign just posterior to the medial malleolus consistent with a complete posterior tibialis tendon rupture of the left ankle.      Assessment & Plan:   Left ankle pain and swelling secondary to posterior tibialis tendon rupture and ankle DJD  Custom orthotics were created for Jonathan Powers today. We added a scaphoid pad on the bottom of the orthotic as well as one on top. I've given him additional scaphoid pads to add if needed. Total time spent with him was 30 minutes with greater than 50% of the time spent in face-to-face consultation discussing orthotic construction, instruction, and fitting. He will see orthopedics for surgery when he is ready. Follow-up with me as needed.  Patient was fitted for a : standard, cushioned, semi-rigid orthotic. The  orthotic was heated and afterward the patient stood on the orthotic blank positioned on the orthotic stand. The patient was positioned in subtalar neutral position and 10 degrees of ankle dorsiflexion in a weight bearing stance. After completion of molding, a stable base was applied to the orthotic blank. The blank was ground to a stable position for weight bearing. Size: 12 Base: Blue EVA Posting: none Additional orthotic padding: see above

## 2017-01-01 ENCOUNTER — Other Ambulatory Visit: Payer: Self-pay | Admitting: Surgery

## 2017-01-01 DIAGNOSIS — K642 Third degree hemorrhoids: Secondary | ICD-10-CM | POA: Diagnosis not present

## 2017-01-03 ENCOUNTER — Other Ambulatory Visit: Payer: Self-pay | Admitting: *Deleted

## 2017-01-03 DIAGNOSIS — Z79899 Other long term (current) drug therapy: Secondary | ICD-10-CM

## 2017-01-03 DIAGNOSIS — G8929 Other chronic pain: Secondary | ICD-10-CM

## 2017-01-03 DIAGNOSIS — R5383 Other fatigue: Secondary | ICD-10-CM

## 2017-01-03 LAB — COMPLETE METABOLIC PANEL WITH GFR
AG RATIO: 1.9 (calc) (ref 1.0–2.5)
ALBUMIN MSPROF: 4.5 g/dL (ref 3.6–5.1)
ALT: 16 U/L (ref 9–46)
AST: 17 U/L (ref 10–35)
Alkaline phosphatase (APISO): 59 U/L (ref 40–115)
BUN: 20 mg/dL (ref 7–25)
CALCIUM: 9.5 mg/dL (ref 8.6–10.3)
CO2: 30 mmol/L (ref 20–32)
CREATININE: 0.87 mg/dL (ref 0.70–1.25)
Chloride: 101 mmol/L (ref 98–110)
GFR, EST AFRICAN AMERICAN: 105 mL/min/{1.73_m2} (ref >=60)
GFR, EST NON AFRICAN AMERICAN: 91 mL/min/{1.73_m2} (ref >=60)
GLOBULIN: 2.4 g/dL (ref 1.9–3.7)
Glucose, Bld: 78 mg/dL (ref 65–99)
POTASSIUM: 4.4 mmol/L (ref 3.5–5.3)
SODIUM: 140 mmol/L (ref 135–146)
TOTAL PROTEIN: 6.9 g/dL (ref 6.1–8.1)
Total Bilirubin: 0.5 mg/dL (ref 0.2–1.2)

## 2017-01-03 LAB — CBC WITH DIFFERENTIAL/PLATELET
BASOS PCT: 0.6 %
Basophils Absolute: 37 cells/uL (ref 0–200)
EOS PCT: 3.7 %
Eosinophils Absolute: 229 cells/uL (ref 15–500)
HEMATOCRIT: 46.6 % (ref 38.5–50.0)
HEMOGLOBIN: 16.5 g/dL (ref 13.2–17.1)
LYMPHS ABS: 2244 {cells}/uL (ref 850–3900)
MCH: 30.8 pg (ref 27.0–33.0)
MCHC: 35.4 g/dL (ref 32.0–36.0)
MCV: 86.9 fL (ref 80.0–100.0)
MPV: 8.8 fL (ref 7.5–12.5)
Monocytes Relative: 11.3 %
NEUTROS ABS: 2988 {cells}/uL (ref 1500–7800)
NEUTROS PCT: 48.2 %
Platelets: 294 10*3/uL (ref 140–400)
RBC: 5.36 10*6/uL (ref 4.20–5.80)
RDW: 12.4 % (ref 11.0–15.0)
Total Lymphocyte: 36.2 %
WBC: 6.2 10*3/uL (ref 3.8–10.8)
WBCMIX: 701 {cells}/uL (ref 200–950)

## 2017-01-03 LAB — SEDIMENTATION RATE: SED RATE: 9 mm/h (ref 0–20)

## 2017-01-04 NOTE — Progress Notes (Signed)
WNLs

## 2017-01-10 DIAGNOSIS — M2142 Flat foot [pes planus] (acquired), left foot: Secondary | ICD-10-CM | POA: Diagnosis not present

## 2017-01-10 DIAGNOSIS — M79672 Pain in left foot: Secondary | ICD-10-CM | POA: Diagnosis not present

## 2017-01-10 DIAGNOSIS — M2141 Flat foot [pes planus] (acquired), right foot: Secondary | ICD-10-CM | POA: Diagnosis not present

## 2017-01-10 DIAGNOSIS — M76822 Posterior tibial tendinitis, left leg: Secondary | ICD-10-CM | POA: Diagnosis not present

## 2017-01-10 DIAGNOSIS — M25572 Pain in left ankle and joints of left foot: Secondary | ICD-10-CM | POA: Diagnosis not present

## 2017-01-22 ENCOUNTER — Other Ambulatory Visit: Payer: Self-pay | Admitting: Internal Medicine

## 2017-01-22 MED FILL — TADALAFIL 5 MG TABS: 5 | 30 days supply | Qty: 30 | Fill #1

## 2017-01-24 ENCOUNTER — Other Ambulatory Visit: Payer: Self-pay | Admitting: Rheumatology

## 2017-01-25 NOTE — Telephone Encounter (Addendum)
Last Visit: 07/21/16 Next Visit was due September 2018.  Labs: 01/03/17 WNL PLQ Eye Exam: not on file  Okay to refill PLQ?

## 2017-01-26 MED FILL — HYDROXYCHLOROQUINE 200 MG: 200 | 30 days supply | Qty: 60 | Fill #0

## 2017-01-26 NOTE — Telephone Encounter (Signed)
Please ask him about his eye exam details

## 2017-01-26 NOTE — Telephone Encounter (Signed)
Patient had [facility] 07/2016 WNL at Dr. Bing Plume office.   Okay to refill per Dr. Estanislado Pandy

## 2017-01-29 ENCOUNTER — Encounter: Payer: 59 | Admitting: Internal Medicine

## 2017-01-29 MED FILL — traZODone HCL 100 MG TABS: 100 | 90 days supply | Qty: 90 | Fill #0

## 2017-02-05 ENCOUNTER — Ambulatory Visit: Payer: 59 | Admitting: Internal Medicine

## 2017-02-05 MED FILL — LEVOTHYROXINE 175 MCG TABLE: 175 | 90 days supply | Qty: 90 | Fill #1

## 2017-02-08 MED FILL — TESTOSTERONE 30 MG/1.5 ML P: 30 | 30 days supply | Qty: 90 | Fill #0

## 2017-02-14 DIAGNOSIS — G4733 Obstructive sleep apnea (adult) (pediatric): Secondary | ICD-10-CM | POA: Diagnosis not present

## 2017-02-19 ENCOUNTER — Other Ambulatory Visit (INDEPENDENT_AMBULATORY_CARE_PROVIDER_SITE_OTHER): Payer: 59

## 2017-02-19 ENCOUNTER — Encounter: Payer: Self-pay | Admitting: Internal Medicine

## 2017-02-19 ENCOUNTER — Ambulatory Visit (INDEPENDENT_AMBULATORY_CARE_PROVIDER_SITE_OTHER): Payer: 59 | Admitting: Internal Medicine

## 2017-02-19 VITALS — BP 124/64 | HR 68 | Temp 98.3°F | Resp 16 | Ht 71.0 in | Wt 285.0 lb

## 2017-02-19 DIAGNOSIS — E039 Hypothyroidism, unspecified: Secondary | ICD-10-CM

## 2017-02-19 DIAGNOSIS — R0602 Shortness of breath: Secondary | ICD-10-CM | POA: Insufficient documentation

## 2017-02-19 DIAGNOSIS — Z0001 Encounter for general adult medical examination with abnormal findings: Secondary | ICD-10-CM | POA: Diagnosis not present

## 2017-02-19 DIAGNOSIS — Z0000 Encounter for general adult medical examination without abnormal findings: Secondary | ICD-10-CM

## 2017-02-19 DIAGNOSIS — Z23 Encounter for immunization: Secondary | ICD-10-CM | POA: Diagnosis not present

## 2017-02-19 LAB — TSH: TSH: 1.68 u[IU]/mL (ref 0.35–4.50)

## 2017-02-19 LAB — BRAIN NATRIURETIC PEPTIDE: PRO B NATRI PEPTIDE: 35 pg/mL (ref 0.0–100.0)

## 2017-02-19 LAB — LIPID PANEL
CHOL/HDL RATIO: 4
CHOLESTEROL: 173 mg/dL (ref 0–200)
HDL: 43.4 mg/dL (ref >39.00)
LDL CALC: 97 mg/dL (ref 0–99)
NonHDL: 130.09
Triglycerides: 165 mg/dL — ABNORMAL HIGH (ref 0.0–149.0)
VLDL: 33 mg/dL (ref 0.0–40.0)

## 2017-02-19 LAB — TROPONIN I: TNIDX: 0.01 ug/L (ref 0.00–0.06)

## 2017-02-19 MED FILL — TADALAFIL 5 MG TABS: 5 | 30 days supply | Qty: 30 | Fill #2

## 2017-02-19 NOTE — Progress Notes (Signed)
Subjective:  Patient ID: Jonathan Powers, male    DOB: 06/10/1950  Age: 66 y.o. MRN: [id number]  CC: Hypertension; Hypothyroidism; and Annual Exam   HPI Jonathan Powers presents for a CPX.  He complains of mild shortness of breath that occurs with rest and with exertion.  He had a normal MPI 4 years ago.  He thinks his symptoms are related to weight gain and poor conditioning.  He ruptured his left Achilles tendon a few months ago and has mild, persistent swelling in his left lower extremity.  He also complains of mild fatigue but he denies chest pain, diaphoresis, lightheadedness, dizziness, or syncope.  Outpatient Medications Prior to Visit  Medication Sig Dispense Refill  . acetaminophen (TYLENOL) 500 MG tablet Take 1,000 mg by mouth 2 (two) times daily.    . cetirizine (ZYRTEC) 10 MG tablet Take 10 mg by mouth daily.    . Cholecalciferol (VITAMIN D-3) 5000 UNITS TABS Take by mouth. Takes Monday thru friday    . ciclopirox (PENLAC) 8 % solution APPLY OVER NAIL & SURROUNDING SKIN, APPLY DAILY OVER PREVIOUS COAT. AFTER 7 DAYS, MAY REMOVE WITH ALCOHOL & CONTINUE CYCLE 6.6 mL 0  . diclofenac sodium (VOLTAREN) 1 % GEL 2-4 grams qid as needed for pain as discussed in office 10 Tube 12  . Esomeprazole Magnesium (NEXIUM 24HR PO) Take 1 tablet by mouth daily as needed.     . febuxostat (ULORIC) 40 MG tablet Take 40 mg by mouth daily.     . folic acid (FOLVITE) 1 MG tablet Take 2 tablets (2 mg total) by mouth daily. 180 tablet 3  . hydroxychloroquine (PLAQUENIL) 200 MG tablet TAKE 1 TABLET BY MOUTH 2 TIMES DAILY. 60 tablet 2  . levothyroxine (SYNTHROID, LEVOTHROID) 175 MCG tablet TAKE 1 TABLET BY MOUTH ONCE DAILY BEFORE BREAKFAST. 90 tablet 1  . predniSONE (DELTASONE) 5 MG tablet Take 10 mg PO QD x 7 days, then 7.5 mg PO QD x 7 days, then 5 mg PO QD x 7 days, then 2.5 mg PO QD x 7 days. 35 tablet 0  . tadalafil (CIALIS) 5 MG tablet Take 1 tablet (5 mg total) by mouth daily as needed for erectile  dysfunction. 18 tablet 0  . telmisartan (MICARDIS) 40 MG tablet TAKE 1 TABLET BY MOUTH DAILY. 90 tablet 1  . Testosterone 30 MG/ACT SOLN   5  . traZODone (DESYREL) 100 MG tablet TAKE 1/2-1 TABLET BY MOUTH AT BEDTIME AS NEEDED FOR SLEEP. 90 tablet 1  . triamcinolone cream (KENALOG) 0.1 %     . valACYclovir (VALTREX) 1000 MG tablet   11  . methotrexate 50 MG/2ML injection Inject 0.6 mL sub q once weekly x 2 weeks labs, then increase to 0.86mL sub q 10 mL 0  . predniSONE (DELTASONE) 1 MG tablet Take 4 tablets (4 mg total) by mouth daily with breakfast. 120 tablet 0  . TUBERCULIN SYR 1CC/27GX1/2" (B-D TB SYRINGE 1CC/27GX1/2") 27G X 1/2" 1 ML MISC To use with injectable methotrexate once a week 100 each 1   No facility-administered medications prior to visit.     ROS Review of Systems  Constitutional: Positive for fatigue. Negative for activity change, appetite change, chills, diaphoresis, fever and unexpected weight change.  HENT: Negative.  Negative for trouble swallowing.   Eyes: Negative for visual disturbance.  Respiratory: Positive for apnea and shortness of breath. Negative for cough, chest tightness and wheezing.   Cardiovascular: Positive for leg swelling. Negative for chest pain  and palpitations.  Gastrointestinal: Negative for abdominal pain, constipation, diarrhea, nausea and vomiting.  Endocrine: Negative.  Negative for cold intolerance and heat intolerance.  Genitourinary: Negative.  Negative for difficulty urinating.  Musculoskeletal: Positive for arthralgias. Negative for back pain and myalgias.  Skin: Negative.  Negative for color change and rash.  Allergic/Immunologic: Negative.   Neurological: Negative.  Negative for dizziness, weakness and light-headedness.  Hematological: Negative for adenopathy. Does not bruise/bleed easily.  Psychiatric/Behavioral: Negative.     Objective:  BP 124/64 (BP Location: Left Arm, Patient Position: Sitting, Cuff Size: Large)   Pulse 68    Temp 98.3 F (36.8 C) (Oral)   Resp 16   Ht 5\' 11"  (1.803 m)   Wt 285 lb (129.3 kg)   SpO2 98%   BMI 39.75 kg/m   BP Readings from Last 3 Encounters:  02/19/17 124/64  12/25/16 130/78  10/12/16 128/74    Wt Readings from Last 3 Encounters:  02/19/17 285 lb (129.3 kg)  12/25/16 285 lb (129.3 kg)  10/12/16 283 lb (128.4 kg)    Physical Exam  Constitutional: He is oriented to person, place, and time. No distress.  HENT:  Mouth/Throat: Oropharynx is clear and moist. No oropharyngeal exudate.  Eyes: Conjunctivae are normal. Left eye exhibits no discharge. No scleral icterus.  Neck: Normal range of motion. Neck supple. No JVD present. No thyromegaly present.  Cardiovascular: Normal rate, regular rhythm and normal heart sounds. Exam reveals no gallop.  No murmur heard. EKG ---  Sinus  Rhythm  WITHIN NORMAL LIMITS   Pulmonary/Chest: Effort normal and breath sounds normal. No respiratory distress. He has no wheezes. He has no rales.  Abdominal: Soft. Bowel sounds are normal. He exhibits no distension and no mass. There is no tenderness.  Genitourinary:  Genitourinary Comments: GU and rectal exams were deferred at his request since he recently had these exams performed by a urologist.  Musculoskeletal: He exhibits edema (trace edema LLE). He exhibits no tenderness or deformity.  Lymphadenopathy:    He has no cervical adenopathy.  Neurological: He is alert and oriented to person, place, and time.  Skin: Skin is warm and dry. No rash noted. He is not diaphoretic. No erythema. No pallor.  Psychiatric: He has a normal mood and affect. His behavior is normal. Judgment and thought content normal.  Vitals reviewed.   Lab Results  Component Value Date   WBC 6.2 01/03/2017   HGB 16.5 01/03/2017   HCT 46.6 01/03/2017   PLT 294 01/03/2017   GLUCOSE 78 01/03/2017   CHOL 173 02/19/2017   TRIG 165.0 (H) 02/19/2017   HDL 43.40 02/19/2017   LDLCALC 97 02/19/2017   ALT 16 01/03/2017    AST 17 01/03/2017   NA 140 01/03/2017   K 4.4 01/03/2017   CL 101 01/03/2017   CREATININE 0.87 01/03/2017   BUN 20 01/03/2017   CO2 30 01/03/2017   TSH 1.68 02/19/2017   PSA 0.61 07/03/2016   INR 2.4 (H) 07/29/2006   HGBA1C 5.6 09/29/2014    Mr Cervical Spine W/o Contrast  Result Date: 09/04/2016 CLINICAL DATA:  LEFT shoulder pain. Chronic neck pain. Numbness down LEFT arm for 1 month. Numbness extends to middle finger. EXAM: MRI CERVICAL SPINE WITHOUT CONTRAST TECHNIQUE: Multiplanar, multisequence MR imaging of the cervical spine was performed. No intravenous contrast was administered. COMPARISON:  02/19/2013. FINDINGS: Alignment: Physiologic.Bold Vertebrae: No fracture, evidence of discitis, or bone lesion. Endplate reactive changes above and below C4-5, C5-6, and C6-7. Cord: Normal  signal and morphology. Posterior Fossa, vertebral arteries, paraspinal tissues: Negative. Disc levels: C2-3:  Normal disc space. C3-4:  Annular bulge.  Mild facet arthropathy.  No impingement. C4-5: Disc space narrowing. Facet arthropathy. Annular bulge. Disc osteophyte complex. No central canal stenosis. BILATERAL RIGHT greater than LEFT C5 foraminal narrowing. C5-6: Disc space narrowing. Annular bulge. Osseous spurring. Mild stenosis. BILATERAL C6 foraminal narrowing slightly worse on the LEFT. C6-7: Disc space narrowing. Annular bulge. Osseous spurring eccentric to the LEFT. There may be a small soft disc protrusion or uncinate spur in the foramen, see series 6, image 11, axial image 26 series 8. C7-T1:  Unremarkable. Compared with 2014, there is more disc space narrowing at C4-5, as well as more endplate reactive changes from C4 through C7. IMPRESSION: Mild progression of spondylosis since 2014. Disc space narrowing with potentially symptomatic neural impingement at C4-5, C5-6, and C6-7, as described above. With regard to the distribution of LEFT-sided numbness, with middle finger usually representing C7  distribution, the LEFT foraminal narrowing at C6-7 does not appear as severe as that at C4-5 and C5-6. If further investigation desired, cervical myelogram and postmyelogram CT could be performed for further evaluation. Electronically Signed   By: Staci Righter M.D.   On: 09/04/2016 08:34   Mr Shoulder Left W/o Contrast  Result Date: 09/04/2016 CLINICAL DATA:  Chronic neck pain with left shoulder pain and numbness down the left arm EXAM: MRI OF THE LEFT SHOULDER WITHOUT CONTRAST TECHNIQUE: Multiplanar, multisequence MR imaging of the shoulder was performed. No intravenous contrast was administered. COMPARISON:  08/09/2012 FINDINGS: Rotator cuff: Mild tendinosis of the supraspinatus tendon with a small partial-thickness articular surface tear anteriorly. Infraspinatus tendon is intact. Teres minor tendon is intact. Subscapularis tendon is intact. Muscles: No atrophy or fatty replacement of nor abnormal signal within, the muscles of the rotator cuff. Biceps long head: Severe tendinosis of the intraarticular portion of the long head of the biceps tendon with a small partial tear of the proximal extra-articular portion of the long head of the biceps tendon. Acromioclavicular Joint: Moderate arthropathy of the acromioclavicular joint. Type I acromion. Trace subacromial/ subdeltoid bursal fluid. Glenohumeral Joint: No joint effusion. Mild partial-thickness cartilage loss of the glenohumeral joint. Labrum:  Superior posterior labral degeneration. Powers:  No marrow signal abnormality.  No fracture or dislocation. Other: No fluid collection or hematoma. IMPRESSION: 1. Severe tendinosis of the intraarticular portion of the long head of the biceps tendon with a small partial tear of the proximal extra-articular portion of the long head of the biceps tendon. 2. Mild tendinosis of the supraspinatus tendon with a small partial-thickness articular surface tear anteriorly. 3. Mild osteoarthritis of the left glenohumeral joint.  Electronically Signed   By: Kathreen Devoid   On: 09/04/2016 08:45    Assessment & Plan:   Cameren was seen today for hypertension, hypothyroidism and annual exam.  Diagnoses and all orders for this visit:  Acquired hypothyroidism- His TSH is in the normal range.  Will continue the current dose of levothyroxine. -     TSH; Future  Routine general medical examination at a health care facility- Exam completed, labs reviewed, vaccines reviewed - his hepatitis B surface antibody is borderline and he has no hepatitis A antibodies so I have asked him to come in to be vaccinated against hepatitis A and B, -     Hepatitis A antibody, total; Future -     Hepatitis B core antibody, total; Future -     Hepatitis B  surface antibody; Future -     Lipid panel; Future  SOB (shortness of breath)- His symptoms do not sound like ischemia and he has had a prior cardiac workup that was negative.  His EKG is normal.  Troponin and BNP are normal.  D-dimer is normal.  I agree with him that I think the DOE is related to weight gain and poor conditioning.  He agrees to be more active and will let me know if he develops any new or worsening symptoms. -     Troponin I; Future -     D-dimer, quantitative (not at [facility]); Future -     Brain natriuretic peptide; Future -     EKG 12-Lead  Need for pneumococcal vaccination -     Pneumococcal conjugate vaccine 13-valent   I have discontinued Maddix Heinz. Marszalek's methotrexate and TUBERCULIN SYR 1CC/27GX1/2". I am also having him maintain his cetirizine, Esomeprazole Magnesium (NEXIUM 24HR PO), febuxostat, acetaminophen, Vitamin D-3, tadalafil, valACYclovir, Testosterone, triamcinolone cream, predniSONE, ciclopirox, levothyroxine, folic acid, diclofenac sodium, telmisartan, traZODone, and hydroxychloroquine.  No orders of the defined types were placed in this encounter.    Follow-up: Return in about 6 months (around 08/19/2017).  Scarlette Calico, MD

## 2017-02-19 NOTE — Patient Instructions (Signed)

## 2017-02-20 LAB — D-DIMER, QUANTITATIVE: D-Dimer, Quant: 0.49 mcg/mL FEU (ref <0.50)

## 2017-02-20 LAB — HEPATITIS B CORE ANTIBODY, TOTAL: HEP B C TOTAL AB: NONREACTIVE

## 2017-02-20 LAB — HEPATITIS B SURFACE ANTIBODY,QUALITATIVE: HEP B S AB: BORDERLINE — AB

## 2017-02-20 LAB — HEPATITIS A ANTIBODY, TOTAL: HEPATITIS A AB,TOTAL: NONREACTIVE

## 2017-02-21 ENCOUNTER — Encounter: Payer: Self-pay | Admitting: Internal Medicine

## 2017-02-22 MED FILL — DICLOFENAC SODIUM 1% GEL: 1 | 62 days supply | Qty: 1000 | Fill #1

## 2017-03-06 ENCOUNTER — Other Ambulatory Visit (INDEPENDENT_AMBULATORY_CARE_PROVIDER_SITE_OTHER): Payer: Self-pay

## 2017-03-06 ENCOUNTER — Ambulatory Visit (HOSPITAL_COMMUNITY)
Admission: RE | Admit: 2017-03-06 | Discharge: 2017-03-06 | Disposition: A | Discharge status: Home / Self Care | Payer: 59 | Source: Ambulatory Visit | Attending: Orthopaedic Surgery | Admitting: Orthopaedic Surgery

## 2017-03-06 ENCOUNTER — Telehealth (INDEPENDENT_AMBULATORY_CARE_PROVIDER_SITE_OTHER): Payer: Self-pay

## 2017-03-06 DIAGNOSIS — M79662 Pain in left lower leg: Secondary | ICD-10-CM

## 2017-03-06 NOTE — Telephone Encounter (Signed)
Left all information for Jonathan Powers to call [facility] for appointment

## 2017-03-06 NOTE — Progress Notes (Signed)
LLE venous duplex prelim: negative for DVT. Large baker's cyst noted. Landry Mellow, RDMS, RVT Called results to Dr. Durward Fortes.

## 2017-03-07 ENCOUNTER — Encounter: Payer: Self-pay | Admitting: Rheumatology

## 2017-03-07 DIAGNOSIS — Z5181 Encounter for therapeutic drug level monitoring: Secondary | ICD-10-CM

## 2017-03-08 DIAGNOSIS — Z5181 Encounter for therapeutic drug level monitoring: Secondary | ICD-10-CM | POA: Diagnosis not present

## 2017-03-08 MED ORDER — TRAMADOL HCL 50 MG PO TABS: 50.0000 mg | ORAL_TABLET | Freq: Three times a day (TID) | ORAL | 0 refills | Status: DC | PRN

## 2017-03-08 MED FILL — traMADol HCL 50 MG TABS: 50 | 10 days supply | Qty: 30 | Fill #0

## 2017-03-08 NOTE — Telephone Encounter (Signed)
I had detailed discussion with the patient. He has taken tramadol in the past for the pain management which has worked well. He cannot take any other medications until his surgery. These have him sign a narcotic agreement and get UDS. Please call and tramadol 50 mg 1 tablet by mouth 3 times a day when necessary total 30 tablets.rx0

## 2017-03-12 DIAGNOSIS — G4733 Obstructive sleep apnea (adult) (pediatric): Secondary | ICD-10-CM | POA: Diagnosis not present

## 2017-03-12 LAB — PAIN MGMT, PROFILE 5 W/CONF, U
AMPHETAMINES: NEGATIVE ng/mL (ref <500)
Barbiturates: NEGATIVE ng/mL (ref <300)
Benzodiazepines: NEGATIVE ng/mL (ref <100)
COCAINE METABOLITE: NEGATIVE ng/mL (ref <150)
CREATININE: 93.4 mg/dL
MARIJUANA METABOLITE: NEGATIVE ng/mL (ref <20)
Methadone Metabolite: NEGATIVE ng/mL (ref <100)
OXIDANT: NEGATIVE ug/mL (ref <200)
Opiates: NEGATIVE ng/mL (ref <100)
Oxycodone: NEGATIVE ng/mL (ref <100)
PH: 6.63 (ref 4.5–9.0)

## 2017-03-12 LAB — PAIN MGMT, TRAMADOL W/MEDMATCH, U
DESMETHYLTRAMADOL: NEGATIVE ng/mL (ref <100)
TRAMADOL: NEGATIVE ng/mL (ref <100)

## 2017-03-12 NOTE — Telephone Encounter (Signed)
Negative.

## 2017-03-14 ENCOUNTER — Other Ambulatory Visit: Payer: Self-pay | Admitting: *Deleted

## 2017-03-14 ENCOUNTER — Telehealth: Payer: Self-pay | Admitting: *Deleted

## 2017-03-14 MED ORDER — DOXYCYCLINE HYCLATE 100 MG PO TABS: 100.0000 mg | ORAL_TABLET | Freq: Two times a day (BID) | ORAL | 0 refills | Status: DC

## 2017-03-14 MED FILL — DOXYCYCLINE HYCLATE 100 MG: 100 | 10 days supply | Qty: 20 | Fill #0

## 2017-03-14 NOTE — Telephone Encounter (Signed)
Patient seen in office for left lower extremity redness, warm to the touch, edema, cellulitis, Doxycycline 100mg  po BID x 10 days faxed into [facility].

## 2017-03-16 DIAGNOSIS — I872 Venous insufficiency (chronic) (peripheral): Secondary | ICD-10-CM | POA: Diagnosis not present

## 2017-03-16 DIAGNOSIS — I8311 Varicose veins of right lower extremity with inflammation: Secondary | ICD-10-CM | POA: Diagnosis not present

## 2017-03-16 DIAGNOSIS — L0889 Other specified local infections of the skin and subcutaneous tissue: Secondary | ICD-10-CM | POA: Diagnosis not present

## 2017-03-16 DIAGNOSIS — I8312 Varicose veins of left lower extremity with inflammation: Secondary | ICD-10-CM | POA: Diagnosis not present

## 2017-03-16 MED FILL — CIPROFLOXACIN HCL 500 MG TA: 500 | 10 days supply | Qty: 20 | Fill #0

## 2017-03-21 MED FILL — TADALAFIL 5 MG TABS: 5 | 30 days supply | Qty: 30 | Fill #3

## 2017-03-21 MED FILL — TELMISARTAN 40 MG TABLET: 40 | 90 days supply | Qty: 90 | Fill #1

## 2017-03-21 MED FILL — TESTOSTERONE 30 MG/1.5 ML P: 30 | 30 days supply | Qty: 90 | Fill #1

## 2017-03-21 MED FILL — HYDROXYCHLOROQUINE 200 MG: 200 | 30 days supply | Qty: 60 | Fill #1

## 2017-03-23 ENCOUNTER — Encounter (HOSPITAL_BASED_OUTPATIENT_CLINIC_OR_DEPARTMENT_OTHER): Payer: Self-pay | Admitting: *Deleted

## 2017-03-23 ENCOUNTER — Other Ambulatory Visit (INDEPENDENT_AMBULATORY_CARE_PROVIDER_SITE_OTHER): Payer: Self-pay | Admitting: Orthopaedic Surgery

## 2017-03-29 ENCOUNTER — Encounter (HOSPITAL_BASED_OUTPATIENT_CLINIC_OR_DEPARTMENT_OTHER): Payer: Self-pay | Admitting: Anesthesiology

## 2017-03-29 NOTE — Anesthesia Preprocedure Evaluation (Addendum)
Anesthesia Evaluation  Patient identified by MRN, date of birth, ID band Patient awake    Reviewed: Allergy & Precautions, NPO status , Patient's Chart, lab work & pertinent test results  Airway Mallampati: III  TM Distance: >3 FB Neck ROM: Full    Dental  (+) Teeth Intact   Pulmonary    breath sounds clear to auscultation       Cardiovascular hypertension, Pt. on medications  Rhythm:Regular Rate:Normal     Neuro/Psych    GI/Hepatic GERD  ,  Endo/Other  Morbid obesity  Renal/GU      Musculoskeletal   Abdominal   Peds  Hematology   Anesthesia Other Findings   Reproductive/Obstetrics                            Anesthesia Physical Anesthesia Plan  ASA: III  Anesthesia Plan: General   Post-op Pain Management:    Induction: Intravenous  PONV Risk Score and Plan: 3 and Ondansetron and Dexamethasone  Airway Management Planned: LMA  Additional Equipment:   Intra-op Plan:   Post-operative Plan:   Informed Consent: I have reviewed the patients History and Physical, chart, labs and discussed the procedure including the risks, benefits and alternatives for the proposed anesthesia with the patient or authorized representative who has indicated his/her understanding and acceptance.     Plan Discussed with: CRNA and Anesthesiologist  Anesthesia Plan Comments:        Anesthesia Quick Evaluation

## 2017-03-29 NOTE — H&P (Signed)
  Jonathan Powers  Location: [facility] Patient #: [id number] DOB: 1950/03/21 Married / Language: English / Race: White Male   History of Present Illness Jonathan Powers A. Jonathan Linden MD; The patient is a 67 year old male who presents with hemorrhoids. He is here for another evaluation of his prolapsing internal hemorrhoid. He was seen last couple years ago for this. He has had banding in the past. He reports minimal discomfort. He has to manually reduce the hemorrhoids after bowel movements. He has had no blood. There is no continence issues.  Past Medical History  Diagnosis Date  . Hypertension   . OSTEOARTHRITIS   . CELLULITIS, LEG, RIGHT     Recurrent R hip cellulitis 1/08,3/08   . ALLERGIC RHINITIS   . Migraines     Atypical and ocular  . Hypothyroid   . Diverticulitis   . GERD (gastroesophageal reflux disease)   . Gout   . Hypogonadism male     low T, a/w ED  . Hashimoto's thyroiditis   . Left thyroid nodule 2008    on [place], decrease size in 2011 [place]  . Seasonal allergies         Past Surgical History  Procedure Laterality Date  . Right shoulder cuff repair Right 09/15/11  . Right shoulder sad, dcr Right 02/05/11  . Total hip arthroplasty Left 07/26/06  . Total hip arthroplasty Right 1999  . Cholecystectomy  2004  . Repair digital thumb, left Left 1990  . Ctr Bilateral 1982  . Cystoscopy  1974  . Open reduction l little finger Left 1970    Allergies (Chemira Jones, CMA; No Known Drug Allergies 09/02/2014  Medication History Malachi Bonds, CMA;   NexIUM (10MG  Packet, Oral) Active. TraZODone HCl (50MG  Tablet, Oral) Active. Uloric (40MG  Tablet, Oral) Active. Tylenol (325MG  Tablet, Oral as needed) Active. Vitamin D3 (5000UNIT Tablet, Oral monday through friday) Active. Synthroid (175MCG Tablet, Oral) Active. Cetirizine HCl (10MG  Tablet, Oral) Active. Vitamin D3 (5000UNIT Tablet Chewable, Oral) Active. Ciclopirox (8%  Solution, External) Active. Diclofenac Sodium (1% Gel, Transdermal) Active. Folic Acid (1MG  Tablet, Oral) Active. Hydroxychloroquine Sulfate (200MG  Tablet, Oral) Active. Methotrexate Sodium (50MG /2ML Solution, Injection) Active. Cialis (5MG  Tablet, Oral) Active. Telmisartan (40MG  Tablet, Oral) Active. Testosterone (30MG /ACT Solution, Transdermal) Active. ValACYclovir HCl (1GM Tablet, Oral) Active. Medications Reconciled  Vitals   Weight: 285.6 lb Height: 71in Body Surface Area: 2.45 m Body Mass Index: 39.83 kg/m  Temp.: 68F(Oral)  Pulse: 75 (Regular)  BP: 142/90 (Sitting, Left Arm, Standard)    Physical Exam (Finnegan Gatta A. Jonathan Linden MD; The physical exam findings are as follows: Note:On examination, the external anal skin is normal. On anoscopy, he has a large internal hemorrhoidal column. After obtaining consent, I was able to band this partially with a rubber band. He tolerated this well. Lungs clear CV RRR Abdomen soft, NT/ND Skin without rash    Assessment & Plan  PROLAPSED INTERNAL HEMORRHOIDS, GRADE 3 (K64.2) Impression: He is going to try to sterile suppositories and I'll see him back in 10 days to see we are improving. If not, we'll need to consider hemorrhoidectomy under anesthesia  Addendum:  At this point, he is felt conservative management so hemorrhoidectomy as recommended. I discussed this procedure with him in detail. We discussed the risks of procedure. He understands and wished to proceed with surgery which will be scheduled

## 2017-03-30 ENCOUNTER — Ambulatory Visit (HOSPITAL_BASED_OUTPATIENT_CLINIC_OR_DEPARTMENT_OTHER)
Admission: RE | Admit: 2017-03-30 | Discharge: 2017-03-30 | Disposition: A | Discharge status: Home / Self Care | Payer: 59 | Source: Ambulatory Visit | Attending: Surgery | Admitting: Surgery

## 2017-03-30 ENCOUNTER — Encounter (HOSPITAL_BASED_OUTPATIENT_CLINIC_OR_DEPARTMENT_OTHER)
Admission: RE | Disposition: A | Discharge status: Home / Self Care | Payer: Self-pay | Source: Ambulatory Visit | Attending: Surgery

## 2017-03-30 ENCOUNTER — Encounter (HOSPITAL_BASED_OUTPATIENT_CLINIC_OR_DEPARTMENT_OTHER): Payer: Self-pay | Admitting: *Deleted

## 2017-03-30 ENCOUNTER — Other Ambulatory Visit: Payer: Self-pay

## 2017-03-30 ENCOUNTER — Ambulatory Visit (HOSPITAL_BASED_OUTPATIENT_CLINIC_OR_DEPARTMENT_OTHER): Payer: 59 | Admitting: Anesthesiology

## 2017-03-30 DIAGNOSIS — I10 Essential (primary) hypertension: Secondary | ICD-10-CM | POA: Insufficient documentation

## 2017-03-30 DIAGNOSIS — K219 Gastro-esophageal reflux disease without esophagitis: Secondary | ICD-10-CM | POA: Diagnosis not present

## 2017-03-30 DIAGNOSIS — E039 Hypothyroidism, unspecified: Secondary | ICD-10-CM | POA: Diagnosis not present

## 2017-03-30 DIAGNOSIS — Z6839 Body mass index (BMI) 39.0-39.9, adult: Secondary | ICD-10-CM | POA: Diagnosis not present

## 2017-03-30 DIAGNOSIS — Z96643 Presence of artificial hip joint, bilateral: Secondary | ICD-10-CM | POA: Diagnosis not present

## 2017-03-30 DIAGNOSIS — E063 Autoimmune thyroiditis: Secondary | ICD-10-CM | POA: Insufficient documentation

## 2017-03-30 DIAGNOSIS — K644 Residual hemorrhoidal skin tags: Secondary | ICD-10-CM | POA: Insufficient documentation

## 2017-03-30 DIAGNOSIS — K648 Other hemorrhoids: Secondary | ICD-10-CM | POA: Insufficient documentation

## 2017-03-30 DIAGNOSIS — J309 Allergic rhinitis, unspecified: Secondary | ICD-10-CM | POA: Insufficient documentation

## 2017-03-30 DIAGNOSIS — E291 Testicular hypofunction: Secondary | ICD-10-CM | POA: Insufficient documentation

## 2017-03-30 DIAGNOSIS — K642 Third degree hemorrhoids: Secondary | ICD-10-CM | POA: Diagnosis not present

## 2017-03-30 DIAGNOSIS — Z79899 Other long term (current) drug therapy: Secondary | ICD-10-CM | POA: Insufficient documentation

## 2017-03-30 DIAGNOSIS — K649 Unspecified hemorrhoids: Secondary | ICD-10-CM | POA: Diagnosis not present

## 2017-03-30 DIAGNOSIS — G5602 Carpal tunnel syndrome, left upper limb: Secondary | ICD-10-CM | POA: Diagnosis not present

## 2017-03-30 DIAGNOSIS — M109 Gout, unspecified: Secondary | ICD-10-CM | POA: Diagnosis not present

## 2017-03-30 HISTORY — PX: HEMORRHOID SURGERY: SHX153

## 2017-03-30 HISTORY — PX: CARPAL TUNNEL RELEASE: SHX101

## 2017-03-30 SURGERY — HEMORRHOIDECTOMY
Anesthesia: General | Site: Rectum

## 2017-03-30 MED ORDER — PROPOFOL 500 MG/50ML IV EMUL
INTRAVENOUS | Status: AC
  Filled 2017-03-30: qty 100

## 2017-03-30 MED ORDER — SCOPOLAMINE 1 MG/3DAYS TD PT72: 1.0000 | MEDICATED_PATCH | Freq: Once | TRANSDERMAL | Status: DC | PRN

## 2017-03-30 MED ORDER — BUPIVACAINE LIPOSOME 1.3 % IJ SUSP
INTRAMUSCULAR | Status: DC | PRN
  Administered 2017-03-30: 20 mL

## 2017-03-30 MED ORDER — OXYCODONE HCL 5 MG PO TABS: 5.0000 mg | ORAL_TABLET | Freq: Once | ORAL | Status: DC | PRN

## 2017-03-30 MED ORDER — CHLORHEXIDINE GLUCONATE CLOTH 2 % EX PADS: 6.0000 | MEDICATED_PAD | Freq: Once | CUTANEOUS | Status: DC

## 2017-03-30 MED ORDER — DIBUCAINE 1 % RE OINT
TOPICAL_OINTMENT | RECTAL | Status: AC
  Filled 2017-03-30: qty 28

## 2017-03-30 MED ORDER — OXYCODONE HCL 5 MG/5ML PO SOLN: 5.0000 mg | Freq: Once | ORAL | Status: DC | PRN

## 2017-03-30 MED ORDER — LIDOCAINE HCL (CARDIAC) 20 MG/ML IV SOLN
INTRAVENOUS | Status: DC | PRN
  Administered 2017-03-30: 30 mg via INTRAVENOUS

## 2017-03-30 MED ORDER — MORPHINE SULFATE (PF) 2 MG/ML IV SOLN: 2.0000 mg | INTRAVENOUS | Status: DC | PRN

## 2017-03-30 MED ORDER — ONDANSETRON HCL 4 MG/2ML IJ SOLN
INTRAMUSCULAR | Status: DC | PRN
  Administered 2017-03-30: 4 mg via INTRAVENOUS

## 2017-03-30 MED ORDER — FENTANYL CITRATE (PF) 100 MCG/2ML IJ SOLN
INTRAMUSCULAR | Status: AC
  Filled 2017-03-30: qty 2

## 2017-03-30 MED ORDER — ONDANSETRON HCL 4 MG/2ML IJ SOLN
4.0000 mg | Freq: Once | INTRAMUSCULAR | Status: DC | PRN
Start: 2017-03-30 — End: 2017-03-30

## 2017-03-30 MED ORDER — FENTANYL CITRATE (PF) 100 MCG/2ML IJ SOLN: 25.0000 ug | INTRAMUSCULAR | Status: DC | PRN

## 2017-03-30 MED ORDER — FENTANYL CITRATE (PF) 100 MCG/2ML IJ SOLN: 50.0000 ug | INTRAMUSCULAR | Status: DC | PRN

## 2017-03-30 MED ORDER — LIDOCAINE 2% (20 MG/ML) 5 ML SYRINGE
INTRAMUSCULAR | Status: AC
  Filled 2017-03-30: qty 5

## 2017-03-30 MED ORDER — CIPROFLOXACIN IN D5W 400 MG/200ML IV SOLN
INTRAVENOUS | Status: AC
  Filled 2017-03-30: qty 200

## 2017-03-30 MED ORDER — PROPOFOL 500 MG/50ML IV EMUL
INTRAVENOUS | Status: DC | PRN
  Administered 2017-03-30: 100 ug/kg/min via INTRAVENOUS

## 2017-03-30 MED ORDER — PROPOFOL 10 MG/ML IV BOLUS
INTRAVENOUS | Status: AC
  Filled 2017-03-30: qty 20

## 2017-03-30 MED ORDER — ONDANSETRON HCL 4 MG/2ML IJ SOLN
INTRAMUSCULAR | Status: AC
  Filled 2017-03-30: qty 2

## 2017-03-30 MED ORDER — CIPROFLOXACIN IN D5W 400 MG/200ML IV SOLN
400.0000 mg | INTRAVENOUS | Status: AC
  Administered 2017-03-30: 400 mg via INTRAVENOUS

## 2017-03-30 MED ORDER — LIDOCAINE 5 % EX OINT: 1.0000 "application " | TOPICAL_OINTMENT | CUTANEOUS | 0 refills | Status: DC | PRN

## 2017-03-30 MED ORDER — SODIUM CHLORIDE 0.9% FLUSH: 3.0000 mL | Freq: Two times a day (BID) | INTRAVENOUS | Status: DC

## 2017-03-30 MED ORDER — DEXAMETHASONE SODIUM PHOSPHATE 10 MG/ML IJ SOLN
INTRAMUSCULAR | Status: AC
  Filled 2017-03-30: qty 1

## 2017-03-30 MED ORDER — BUPIVACAINE HCL (PF) 0.25 % IJ SOLN
INTRAMUSCULAR | Status: DC | PRN
  Administered 2017-03-30: 3 mL

## 2017-03-30 MED ORDER — OXYCODONE HCL 5 MG PO TABS: 5.0000 mg | ORAL_TABLET | ORAL | Status: DC | PRN

## 2017-03-30 MED ORDER — PROPOFOL 10 MG/ML IV BOLUS
INTRAVENOUS | Status: DC | PRN
  Administered 2017-03-30: 200 mg via INTRAVENOUS
  Administered 2017-03-30: 50 mg via INTRAVENOUS
  Administered 2017-03-30: 200 mg via INTRAVENOUS
  Administered 2017-03-30: 50 mg via INTRAVENOUS

## 2017-03-30 MED ORDER — BUPIVACAINE-EPINEPHRINE 0.5% -1:200000 IJ SOLN
INTRAMUSCULAR | Status: DC | PRN
  Administered 2017-03-30: 20 mL

## 2017-03-30 MED ORDER — DIBUCAINE 1 % RE OINT
TOPICAL_OINTMENT | RECTAL | Status: DC | PRN
  Administered 2017-03-30: 1 via RECTAL

## 2017-03-30 MED ORDER — LACTATED RINGERS IV SOLN
INTRAVENOUS | Status: DC
  Administered 2017-03-30 (×2): via INTRAVENOUS

## 2017-03-30 MED ORDER — ACETAMINOPHEN 650 MG RE SUPP: 650.0000 mg | RECTAL | Status: DC | PRN

## 2017-03-30 MED ORDER — MIDAZOLAM HCL 5 MG/5ML IJ SOLN
INTRAMUSCULAR | Status: DC | PRN
  Administered 2017-03-30: 2 mg via INTRAVENOUS

## 2017-03-30 MED ORDER — BUPIVACAINE-EPINEPHRINE (PF) 0.5% -1:200000 IJ SOLN
INTRAMUSCULAR | Status: AC
  Filled 2017-03-30: qty 30

## 2017-03-30 MED ORDER — BUPIVACAINE LIPOSOME 1.3 % IJ SUSP
INTRAMUSCULAR | Status: AC
  Filled 2017-03-30: qty 20

## 2017-03-30 MED ORDER — HYDROMORPHONE HCL 1 MG/ML IJ SOLN: 0.2500 mg | INTRAMUSCULAR | Status: DC | PRN

## 2017-03-30 MED ORDER — ACETAMINOPHEN 325 MG PO TABS: 650.0000 mg | ORAL_TABLET | ORAL | Status: DC | PRN

## 2017-03-30 MED ORDER — DEXAMETHASONE SODIUM PHOSPHATE 4 MG/ML IJ SOLN
INTRAMUSCULAR | Status: DC | PRN
  Administered 2017-03-30: 10 mg via INTRAVENOUS

## 2017-03-30 MED ORDER — SODIUM CHLORIDE 0.9% FLUSH: 3.0000 mL | INTRAVENOUS | Status: DC | PRN

## 2017-03-30 MED ORDER — SODIUM CHLORIDE 0.9 % IV SOLN: 250.0000 mL | INTRAVENOUS | Status: DC | PRN

## 2017-03-30 MED ORDER — MIDAZOLAM HCL 2 MG/2ML IJ SOLN: 1.0000 mg | INTRAMUSCULAR | Status: DC | PRN

## 2017-03-30 MED ORDER — FENTANYL CITRATE (PF) 100 MCG/2ML IJ SOLN
INTRAMUSCULAR | Status: DC | PRN
  Administered 2017-03-30: 100 ug via INTRAVENOUS

## 2017-03-30 MED ORDER — MIDAZOLAM HCL 2 MG/2ML IJ SOLN
INTRAMUSCULAR | Status: AC
  Filled 2017-03-30: qty 2

## 2017-03-30 MED ORDER — OXYCODONE HCL 5 MG PO TABS: 5.0000 mg | ORAL_TABLET | Freq: Four times a day (QID) | ORAL | 0 refills | Status: DC | PRN

## 2017-03-30 MED ORDER — BUPIVACAINE HCL (PF) 0.25 % IJ SOLN
INTRAMUSCULAR | Status: AC
  Filled 2017-03-30: qty 30

## 2017-03-30 MED FILL — oxyCODONE HCL 5 MG TABS: 5 | 4 days supply | Qty: 30 | Fill #0

## 2017-03-30 MED FILL — LIDOCAINE 5 % OINT: 5 | 30 days supply | Qty: 35 | Fill #0

## 2017-03-30 SURGICAL SUPPLY — 65 items
BANDAGE ACE 3X5.8 VEL STRL LF (GAUZE/BANDAGES/DRESSINGS) ×3 IMPLANT
BANDAGE COBAN STERILE 2 (GAUZE/BANDAGES/DRESSINGS) IMPLANT
BLADE SURG 15 STRL LF DISP TIS (BLADE) ×4 IMPLANT
BLADE SURG 15 STRL SS (BLADE) ×2
BNDG ESMARK 4X9 LF (GAUZE/BANDAGES/DRESSINGS) ×3 IMPLANT
BRIEF STRETCH FOR OB PAD XXL (UNDERPADS AND DIAPERS) ×3 IMPLANT
CANISTER SUCT 1200ML W/VALVE (MISCELLANEOUS) ×3 IMPLANT
COVER BACK TABLE 60X90IN (DRAPES) ×3 IMPLANT
COVER MAYO STAND STRL (DRAPES) ×3 IMPLANT
CUFF TOURNIQUET SINGLE 18IN (TOURNIQUET CUFF) ×3 IMPLANT
DECANTER SPIKE VIAL GLASS SM (MISCELLANEOUS) ×3 IMPLANT
DRAPE EXTREMITY T 121X128X90 (DRAPE) ×3 IMPLANT
DRAPE SURG 17X23 STRL (DRAPES) ×3 IMPLANT
DRAPE UTILITY XL STRL (DRAPES) ×3 IMPLANT
DRSG EMULSION OIL 3X3 NADH (GAUZE/BANDAGES/DRESSINGS) ×3 IMPLANT
DRSG PAD ABDOMINAL 8X10 ST (GAUZE/BANDAGES/DRESSINGS) ×3 IMPLANT
DURAPREP 26ML APPLICATOR (WOUND CARE) ×3 IMPLANT
ELECT NEEDLE TIP 2.8 STRL (NEEDLE) ×3 IMPLANT
ELECT REM PT RETURN 9FT ADLT (ELECTROSURGICAL) ×6
ELECTRODE REM PT RTRN 9FT ADLT (ELECTROSURGICAL) ×4 IMPLANT
GAUZE SPONGE 4X4 12PLY STRL (GAUZE/BANDAGES/DRESSINGS) ×3 IMPLANT
GAUZE SPONGE 4X4 12PLY STRL LF (GAUZE/BANDAGES/DRESSINGS) IMPLANT
GLOVE BIOGEL PI IND STRL 7.0 (GLOVE) ×8 IMPLANT
GLOVE BIOGEL PI IND STRL 8 (GLOVE) ×2 IMPLANT
GLOVE BIOGEL PI INDICATOR 7.0 (GLOVE) ×4
GLOVE BIOGEL PI INDICATOR 8 (GLOVE) ×1
GLOVE ECLIPSE 6.5 STRL STRAW (GLOVE) ×3 IMPLANT
GLOVE ECLIPSE 8.0 STRL XLNG CF (GLOVE) ×3 IMPLANT
GLOVE SURG SIGNA 7.5 PF LTX (GLOVE) ×3 IMPLANT
GOWN STRL REUS W/ TWL LRG LVL3 (GOWN DISPOSABLE) ×6 IMPLANT
GOWN STRL REUS W/TWL LRG LVL3 (GOWN DISPOSABLE) ×3
NEEDLE HYPO 25X1 1.5 SAFETY (NEEDLE) ×6 IMPLANT
NS IRRIG 1000ML POUR BTL (IV SOLUTION) ×3 IMPLANT
PACK BASIN DAY SURGERY FS (CUSTOM PROCEDURE TRAY) ×6 IMPLANT
PACK LITHOTOMY IV (CUSTOM PROCEDURE TRAY) ×3 IMPLANT
PAD CAST 3X4 CTTN HI CHSV (CAST SUPPLIES) ×2 IMPLANT
PADDING CAST ABS 3INX4YD NS (CAST SUPPLIES)
PADDING CAST ABS 4INX4YD NS (CAST SUPPLIES)
PADDING CAST ABS COTTON 3X4 (CAST SUPPLIES) IMPLANT
PADDING CAST ABS COTTON 4X4 ST (CAST SUPPLIES) IMPLANT
PADDING CAST COTTON 3X4 STRL (CAST SUPPLIES) ×1
PENCIL BUTTON HOLSTER BLD 10FT (ELECTRODE) ×6 IMPLANT
SHEARS HARMONIC 9CM CVD (BLADE) ×3 IMPLANT
SHEET MEDIUM DRAPE 40X70 STRL (DRAPES) IMPLANT
SLEEVE SCD COMPRESS KNEE MED (MISCELLANEOUS) ×3 IMPLANT
SPLINT PLASTER CAST XFAST 3X15 (CAST SUPPLIES) IMPLANT
SPLINT PLASTER XTRA FASTSET 3X (CAST SUPPLIES)
SPONGE SURGIFOAM ABS GEL 100 (HEMOSTASIS) ×3 IMPLANT
SPONGE SURGIFOAM ABS GEL 12-7 (HEMOSTASIS) IMPLANT
STOCKINETTE 4X48 STRL (DRAPES) ×3 IMPLANT
SUCTION FRAZIER HANDLE 10FR (MISCELLANEOUS)
SUCTION TUBE FRAZIER 10FR DISP (MISCELLANEOUS) IMPLANT
SURGILUBE 2OZ TUBE FLIPTOP (MISCELLANEOUS) ×3 IMPLANT
SUT CHROMIC 2 0 SH (SUTURE) ×6 IMPLANT
SUT CHROMIC 3 0 SH 27 (SUTURE) IMPLANT
SUT ETHILON 4 0 PS 2 18 (SUTURE) ×6 IMPLANT
SYR BULB 3OZ (MISCELLANEOUS) ×3 IMPLANT
SYR CONTROL 10ML LL (SYRINGE) ×6 IMPLANT
TOWEL OR 17X24 6PK STRL BLUE (TOWEL DISPOSABLE) ×6 IMPLANT
TOWEL OR NON WOVEN STRL DISP B (DISPOSABLE) IMPLANT
TRAY DSU PREP LF (CUSTOM PROCEDURE TRAY) ×3 IMPLANT
TRAY PROCTOSCOPIC FIBER OPTIC (SET/KITS/TRAYS/PACK) IMPLANT
TUBE CONNECTING 20X1/4 (TUBING) ×3 IMPLANT
UNDERPAD 30X30 (UNDERPADS AND DIAPERS) ×3 IMPLANT
YANKAUER SUCT BULB TIP NO VENT (SUCTIONS) ×3 IMPLANT

## 2017-03-30 NOTE — Anesthesia Postprocedure Evaluation (Signed)
Anesthesia Post Note  Patient: [name]  Procedure(s) Performed: HEMORRHOIDECTOMY (N/A Rectum) CARPAL TUNNEL RELEASE (Left )     Patient location during evaluation: PACU Anesthesia Type: General Level of consciousness: awake and alert Pain management: pain level controlled Vital Signs Assessment: post-procedure vital signs reviewed and stable Respiratory status: spontaneous breathing, nonlabored ventilation, respiratory function stable and patient connected to nasal cannula oxygen Cardiovascular status: blood pressure returned to baseline and stable Postop Assessment: no apparent nausea or vomiting Anesthetic complications: no    Last Vitals:  Vitals:   03/30/17 0915 03/30/17 0945  BP: (!) 140/94 (!) 155/80  Pulse: 65 86  Resp: (!) 21 20  Temp:  36.7 C  SpO2: 94% 96%    Last Pain:  Vitals:   03/30/17 0945  TempSrc:   PainSc: 0-No pain                 Jonathan Powers

## 2017-03-30 NOTE — Transfer of Care (Signed)
Immediate Anesthesia Transfer of Care Note  Patient: Jonathan Powers  Procedure(s) Performed: HEMORRHOIDECTOMY (N/A Rectum) CARPAL TUNNEL RELEASE (Left )  Patient Location: PACU  Anesthesia Type:General  Level of Consciousness: awake and patient cooperative  Airway & Oxygen Therapy: Patient Spontanous Breathing and Patient connected to face mask oxygen  Post-op Assessment: Report given to RN and Post -op Vital signs reviewed and stable  Post vital signs: Reviewed and stable  Last Vitals:  Vitals:   03/30/17 0642  BP: (!) 147/87  Pulse: 63  Resp: 18  Temp: 36.8 C  SpO2: 98%    Last Pain:  Vitals:   03/30/17 0642  TempSrc: Oral  PainSc: 2       Patients Stated Pain Goal: 2 (67/34/19 3790)  Complications: No apparent anesthesia complications

## 2017-03-30 NOTE — Anesthesia Procedure Notes (Signed)
Procedure Name: LMA Insertion Date/Time: 03/30/2017 7:38 AM Performed by: Marrianne Mood, CRNA Pre-anesthesia Checklist: Patient identified, Emergency Drugs available, Suction available, Patient being monitored and Timeout performed Patient Re-evaluated:Patient Re-evaluated prior to induction Oxygen Delivery Method: Circle system utilized Preoxygenation: Pre-oxygenation with 100% oxygen Induction Type: IV induction Ventilation: Mask ventilation without difficulty LMA: LMA inserted LMA Size: 5.0 Number of attempts: 1 Airway Equipment and Method: Bite block Placement Confirmation: positive ETCO2 Tube secured with: Tape Dental Injury: Teeth and Oropharynx as per pre-operative assessment

## 2017-03-30 NOTE — Discharge Instructions (Signed)
[facility] _______[facility] ° °RECTAL SURGERY POST OP INSTRUCTIONS: POST OP INSTRUCTIONS ° °Always review your discharge instruction sheet given to you by the facility where your surgery was performed. °IF YOU HAVE DISABILITY OR FAMILY LEAVE FORMS, YOU MUST BRING THEM TO THE OFFICE FOR PROCESSING.   °DO NOT GIVE THEM TO YOUR DOCTOR. ° °1. A  prescription for pain medication may be given to you upon discharge.  Take your pain medication as prescribed, if needed.  If narcotic pain medicine is not needed, then you may take acetaminophen (Tylenol) or ibuprofen (Advil) as needed. °2. Take your usually prescribed medications unless otherwise directed. °3. If you need a refill on your pain medication, please contact your pharmacy.  They will contact our office to request authorization. Prescriptions will not be filled after 5 pm or on week-ends. °4. You should follow a light diet the first 48 hours after arrival home, such as soup and crackers, etc.  Be sure to include lots of fluids daily.  Resume your normal diet 2-3 days after surgery.. °5. Most patients will experience some swelling and discomfort in the rectal area. Ice packs, reclining and warm tub soaks will help.  Swelling and discomfort can take several days to resolve.  °6. It is common to experience some constipation if taking pain medication after surgery.  Increasing fluid intake and taking a stool softener (such as Colace) will usually help or prevent this problem from occurring.  A mild laxative (Milk of Magnesia or Miralax) should be taken according to package directions if there are no bowel movements after 48 hours. °7. Unless discharge instructions indicate otherwise, leave your bandage dry and in place for 24 hours, or remove the bandage if you have a bowel movement. You may notice a small amount of bleeding with bowel movements for the first few days. You may have some packing in the rectum which will come out over the first day or two. You  will need to wear an absorbent pad or soft cotton gauze in your underwear until the drainage stops.it. °8. ACTIVITIES:  You may resume regular (light) daily activities beginning the next day--such as daily self-care, walking, climbing stairs--gradually increasing activities as tolerated.  You may have sexual intercourse when it is comfortable.  Refrain from any heavy lifting or straining until approved by your doctor. °a. You may drive when you are no longer taking prescription pain medication, you can comfortably wear a seatbelt, and you can safely maneuver your car and apply brakes. °b. RETURN TO WORK: : ____________________ °c.  °9. You should see your doctor in the office for a follow-up appointment approximately 2-3 weeks after your surgery.  Make sure that you call for this appointment within a day or two after you arrive home to insure a convenient appointment time. °10. OTHER INSTRUCTIONS:  __________________________________________________________________________________________________________________________________________________________________________________________  °WHEN TO CALL YOUR DOCTOR: °1. Fever over 101.0 °2. Inability to urinate °3. Nausea and/or vomiting °4. Extreme swelling or bruising °5. Continued bleeding from rectum. °6. Increased pain, redness, or drainage from the incision °7. Constipation ° °The clinic staff is available to answer your questions during regular business hours.  Please don’t hesitate to call and ask to speak to one of the nurses for clinical concerns.  If you have a medical emergency, go to the nearest emergency room or call 911.  A surgeon from [facility] is always on call at the hospital ° ° °[address], [address], [place], [place]  [address] ? °   [id number], [place],    [address] [phone] ? [phone] ? FAX [phone] Web site: [email or web address]    Post Anesthesia Home Care Instructions  Activity: Get  plenty of rest for the remainder of the day. A responsible individual must stay with you for 24 hours following the procedure.  For the next 24 hours, DO NOT: -Drive a car -Paediatric nurse -Drink alcoholic beverages -Take any medication unless instructed by your physician -Make any legal decisions or sign important papers.  Meals: Start with liquid foods such as gelatin or soup. Progress to regular foods as tolerated. Avoid greasy, spicy, heavy foods. If nausea and/or vomiting occur, drink only clear liquids until the nausea and/or vomiting subsides. Call your physician if vomiting continues.  Special Instructions/Symptoms: Your throat may feel dry or sore from the anesthesia or the breathing tube placed in your throat during surgery. If this causes discomfort, gargle with warm salt water. The discomfort should disappear within 24 hours.  If you had a scopolamine patch placed behind your ear for the management of post- operative nausea and/or vomiting:  1. The medication in the patch is effective for 72 hours, after which it should be removed.  Wrap patch in a tissue and discard in the trash. Wash hands thoroughly with soap and water. 2. You may remove the patch earlier than 72 hours if you experience unpleasant side effects which may include dry mouth, dizziness or visual disturbances. 3. Avoid touching the patch. Wash your hands with soap and water after contact with the patch.

## 2017-03-30 NOTE — Interval H&P Note (Signed)
History and Physical Interval Note: no change in H and P  03/30/2017 7:04 AM  Jonathan Powers  has presented today for surgery, with the diagnosis of prolapsing internal hemorrhoid, left carpal tunnel syndrome  The various methods of treatment have been discussed with the patient and family. After consideration of risks, benefits and other options for treatment, the patient has consented to  Procedure(s): HEMORRHOIDECTOMY (N/A) CARPAL TUNNEL RELEASE (Left) as a surgical intervention .  The patient's history has been reviewed, patient examined, no change in status, stable for surgery.  I have reviewed the patient's chart and labs.  Questions were answered to the patient's satisfaction.     Ryelan Kazee A

## 2017-03-30 NOTE — Progress Notes (Signed)
PATIENT ID:      JALIEL DEAVERS  MRN:     [id number] DOB/AGE:    1950-10-24 / 67 y.o.       OPERATIVE REPORT    DATE OF PROCEDURE:  03/30/2017       PREOPERATIVE DIAGNOSIS:  Left carpal tunnel syndrome                                                       Estimated body mass index is 40.64 kg/m as calculated from the following:   Height as of this encounter: 5\' 11"  (1.803 m).   Weight as of this encounter: 291 lb 6.4 oz (132.2 kg).     POSTOPERATIVE DIAGNOSIS: same                                                                   Estimated body mass index is 40.64 kg/m as calculated from the following:   Height as of this encounter: 5\' 11"  (1.803 m).   Weight as of this encounter: 291 lb 6.4 oz (132.2 kg).     PROCEDURE:  Procedure(s):release volar carpal ligament left wrist and decompression of median nerve  CARPAL TUNNEL RELEASE left     SURGEON:  Joni Fears, MD    ASSISTANT:   none         ANESTHESIA: general     DRAINS: none :      TOURNIQUET TIME:  Total Tourniquet Time Documented: Upper Arm (Left) - 27 minutes Total: Upper Arm (Left) - 27 minutes     COMPLICATIONS:  None   CONDITION:  stable  PROCEDURE IN DETAIL: [id number]   Garald Balding 03/30/2017, 8:31 AM  Patient ID: [name], male   DOB: 05-Jan-1951, 67 y.o.   MRN: [id number]

## 2017-03-30 NOTE — Op Note (Signed)
HEMORRHOIDECTOMY  Procedure Note  Jonathan Powers 03/30/2017   Pre-op Diagnosis: Grade 3 prolapsing internal and external hemorrhoid x2     Post-op Diagnosis: Same  Procedure(s): Full: Internal and external hemorrhoidectomy x2  Surgeon: Dr. Harl Bowie Anesthesia: General  Staff:  Circulator: Lisbeth Ply, RN Scrub Person: Leroy Libman, RN; Neiers, Rhea Pink, CST  Estimated Blood Loss: Minimal               Specimens: Sent to path  Procedure: The patient was brought to the operating room and placed in supine position on the operating room table.  He was identified as the correct patient.  General anesthesia was then induced.  He was then placed in the lithotomy position.  His perianal area was prepped and draped in the usual sterile fashion.  I placed Powers retractor into the anal canal and inspected the anal canal circumferentially.  The patient had 2 large internal and external hemorrhoidal columns.  One was partially thrombosed.  I excised both of the columns with the harmonic scalpel.  I then closed the mucosal defects at both sites with running interlocked 2-0 chromic sutures.  Hemostasis appeared to be achieved in both areas.  I injected Exparel circumferentially around the anal canal mixed with Marcaine.  I then placed Powers piece of Gelfoam into the anal canal.  The patient tolerated procedure well.  All counts were correct at the end of the procedure.  The patient was then extubated in the operating room and taken in Powers stable condition to the recovery room.          Jonathan Powers   Date: 03/30/2017  Time: 8:07 AM

## 2017-04-02 ENCOUNTER — Encounter (HOSPITAL_BASED_OUTPATIENT_CLINIC_OR_DEPARTMENT_OTHER): Payer: Self-pay | Admitting: Surgery

## 2017-04-02 NOTE — Op Note (Signed)
NAMEBILLYJACK, TROMPETER              ACCOUNT NO.:  [id number]  MEDICAL RECORD NO.:  [id number]  LOCATION:                                 FACILITY:  PHYSICIAN:  Vonna Kotyk. Durward Fortes, M.D.    DATE OF BIRTH:  DATE OF PROCEDURE:  03/30/2017 DATE OF DISCHARGE:                              OPERATIVE REPORT   PREOPERATIVE DIAGNOSIS:  Recurrent left carpal tunnel syndrome.  POSTOPERATIVE DIAGNOSIS:  Recurrent left carpal tunnel syndrome.  PROCEDURE:  Release of the volar carpal ligament, left wrist and decompression of median nerve.  SURGEON:  Vonna Kotyk. Durward Fortes, M.D.  ANESTHESIA:  With general laryngal.  COMPLICATIONS:  None.  HISTORY:  A 67 year old gentleman who had a prior carpal tunnel release over 30 years ago.  Over the last 6 or so months, he has developed recurrent symptoms of numbness and tingling in the radial 3 digits, dropping objects and pain.  He has had nerve conduction studies and EMGs consistent with moderately severe recurrent carpal tunnel syndrome.  He is now to have surgical decompression.  DESCRIPTION OF PROCEDURE:  Mr. Vigo was met in the holding area, identified the left wrist and hand as the appropriate operative site and marked him accordingly.  The patient was then transported to room #5.  A concomitant procedure was performed by Dr. Ninfa Linden for hemorrhoidectomy.  I prepped the left upper extremity with DuraPrep.  A proximal tourniquet was then applied to the arm.  The left upper extremity was then sterilely draped after the DuraPrep.  Tourniquet was at 250 mmHg.  The prior skin incision was identified and outlined with a marking pen. The incision was made along the longitudinal proximal flexion crease. Using a #15 blade knife, the incision was made and carried down to the subcutaneous tissue.  There was abundant scar tissue from a prior surgery.  The distal extent of the volar carpal ligament and scarred fibers of the palmaris longus were carefully  identified.  I separated these radially and ulnarly.  Self-retaining retractor was inserted.  I found the distal extent of the volar carpal ligament and carefully incised protecting the median nerve beneath.  I then carefully incised the ligament along its entire length.  I protected the nerve, which was to some extent scarred too than the ligament.  I elevated the skin beneath the transverse flexion crease that I could visually see into the proximal extent of the volar carpal ligament and while protecting the nerve, incised it along its entire length, thus completely decompressing the nerve.  There was considerable scarring about the nerve from the prior surgery.  Nerve was somewhat pale.  The motor recurrent branch appeared to be intact.  I also checked to see if there was any evidence of flexor tenosynovitis and did not see any evidence of fluid or chronic inflammatory changes about the flexor tendons.  At that point, the wound was irrigated with saline solution.  I closed the wound with interrupted 4-0 Ethilon.  I infiltrated the skin with 0.25% Marcaine without epinephrine.  Sterile bulky dressing was applied.  The tourniquet was deflated at 27 minutes with immediate capillary refill to the fingers. Further bulky dressing was applied followed by  an Ace bandage.  The patient tolerated procedure well without complications.  He has oxycodone for both his hand and for the hemorrhoid surgery.  We will plan to see him back in the office the 1st of the week.     Vonna Kotyk. Durward Fortes, M.D.     PWW/MEDQ  D:  03/30/2017  T:  03/31/2017  Job:  [id number]

## 2017-04-18 ENCOUNTER — Other Ambulatory Visit (INDEPENDENT_AMBULATORY_CARE_PROVIDER_SITE_OTHER): Payer: Self-pay

## 2017-04-18 DIAGNOSIS — M25571 Pain in right ankle and joints of right foot: Secondary | ICD-10-CM

## 2017-04-18 MED ORDER — DOXYCYCLINE HYCLATE 50 MG PO CAPS: 100.0000 mg | ORAL_CAPSULE | Freq: Two times a day (BID) | ORAL | 0 refills | Status: DC

## 2017-04-18 MED FILL — TESTOSTERONE 30 MG/1.5 ML P: 30 | 30 days supply | Qty: 90 | Fill #2

## 2017-04-18 MED FILL — HYDROXYCHLOROQUINE 200 MG: 200 | 30 days supply | Qty: 60 | Fill #2

## 2017-04-18 MED FILL — DOXYCYCLINE HYCLATE 100 MG: 100 | 7 days supply | Qty: 14 | Fill #0

## 2017-04-18 MED FILL — TADALAFIL 5 MG TABS: 5 | 30 days supply | Qty: 30 | Fill #4

## 2017-04-19 ENCOUNTER — Other Ambulatory Visit (INDEPENDENT_AMBULATORY_CARE_PROVIDER_SITE_OTHER): Payer: Self-pay | Admitting: Orthopaedic Surgery

## 2017-04-19 MED ORDER — DICLOFENAC SODIUM 1 % TD GEL: 4.0000 g | Freq: Four times a day (QID) | TRANSDERMAL | 12 refills | Status: DC

## 2017-04-19 MED FILL — DICLOFENAC SODIUM 1% GEL: 1 | 31 days supply | Qty: 1000 | Fill #0

## 2017-04-26 ENCOUNTER — Other Ambulatory Visit (INDEPENDENT_AMBULATORY_CARE_PROVIDER_SITE_OTHER): Payer: Self-pay | Admitting: Orthopaedic Surgery

## 2017-04-26 MED ORDER — AZITHROMYCIN 250 MG PO TABS: ORAL_TABLET | ORAL | 0 refills | Status: DC

## 2017-04-26 MED FILL — AZITHROMYCIN 250 MG TABLET: 250 | 5 days supply | Qty: 6 | Fill #0

## 2017-05-15 DIAGNOSIS — G4733 Obstructive sleep apnea (adult) (pediatric): Secondary | ICD-10-CM | POA: Diagnosis not present

## 2017-05-18 ENCOUNTER — Other Ambulatory Visit: Payer: Self-pay | Admitting: Rheumatology

## 2017-05-18 ENCOUNTER — Other Ambulatory Visit: Payer: Self-pay | Admitting: Internal Medicine

## 2017-05-18 DIAGNOSIS — E041 Nontoxic single thyroid nodule: Secondary | ICD-10-CM

## 2017-05-18 DIAGNOSIS — E038 Other specified hypothyroidism: Secondary | ICD-10-CM

## 2017-05-18 MED FILL — HYDROXYCHLOROQUINE 200 MG: 200 | 30 days supply | Qty: 60 | Fill #0 | Status: TO

## 2017-05-18 MED FILL — LEVOTHYROXINE 175 MCG TABLE: 175 | 90 days supply | Qty: 90 | Fill #0

## 2017-05-18 MED FILL — traZODone HCL 100 MG TABS: 100 | 90 days supply | Qty: 90 | Fill #1

## 2017-05-18 MED FILL — TADALAFIL 5 MG TABS: 5 | 30 days supply | Qty: 30 | Fill #5

## 2017-05-18 MED FILL — TESTOSTERONE 30 MG/1.5 ML P: 30 | 30 days supply | Qty: 90 | Fill #3

## 2017-05-18 MED FILL — DICLOFENAC SODIUM 1% GEL: 1 | 31 days supply | Qty: 1000 | Fill #1

## 2017-05-18 NOTE — Telephone Encounter (Signed)
Last Visit: 07/21/16 Next Visit was due September 2018 Labs: 01/03/17 WNL PLQ Eye Exam: 07/2016  Okay to refill per Dr. Estanislado Pandy

## 2017-06-06 ENCOUNTER — Other Ambulatory Visit: Payer: Self-pay | Admitting: Rheumatology

## 2017-06-06 MED ORDER — TRAMADOL HCL 50 MG PO TABS: 50.0000 mg | ORAL_TABLET | Freq: Three times a day (TID) | ORAL | 0 refills | Status: DC | PRN

## 2017-06-06 MED FILL — traMADol HCL 50 MG TABS: 50 | 10 days supply | Qty: 30 | Fill #0

## 2017-06-06 NOTE — Telephone Encounter (Signed)
Last Visit: 07/21/16 Next Visit was due September 2018 UDS: 03/08/17 Narc Agreement: 03/08/17  Okay to refill Tramadol?

## 2017-06-06 NOTE — Telephone Encounter (Signed)
ok 

## 2017-06-15 ENCOUNTER — Other Ambulatory Visit: Payer: Self-pay | Admitting: Internal Medicine

## 2017-06-15 DIAGNOSIS — I10 Essential (primary) hypertension: Secondary | ICD-10-CM

## 2017-06-15 MED FILL — TESTOSTERONE 30 MG/1.5 ML P: 30 | 30 days supply | Qty: 90 | Fill #4

## 2017-06-15 MED FILL — DICLOFENAC SODIUM 1% GEL: 1 | 31 days supply | Qty: 1000 | Fill #2 | Status: TO

## 2017-06-15 MED FILL — HYDROXYCHLOROQUINE SULFATE: 200 | 30 days supply | Qty: 60 | Fill #1 | Status: TO

## 2017-06-18 MED FILL — TELMISARTAN 40 MG TABLET: 40 | 90 days supply | Qty: 90 | Fill #0 | Status: TO

## 2017-06-18 MED FILL — CELECOXIB 200 MG CAP: 200 | 10 days supply | Qty: 10 | Fill #0

## 2017-06-21 DIAGNOSIS — Z6839 Body mass index (BMI) 39.0-39.9, adult: Secondary | ICD-10-CM | POA: Diagnosis not present

## 2017-06-21 DIAGNOSIS — M25572 Pain in left ankle and joints of left foot: Secondary | ICD-10-CM | POA: Diagnosis not present

## 2017-06-21 DIAGNOSIS — M109 Gout, unspecified: Secondary | ICD-10-CM | POA: Diagnosis not present

## 2017-06-21 DIAGNOSIS — E785 Hyperlipidemia, unspecified: Secondary | ICD-10-CM | POA: Diagnosis not present

## 2017-06-21 DIAGNOSIS — Z880 Allergy status to penicillin: Secondary | ICD-10-CM | POA: Diagnosis not present

## 2017-06-21 DIAGNOSIS — M19041 Primary osteoarthritis, right hand: Secondary | ICD-10-CM | POA: Diagnosis not present

## 2017-06-21 DIAGNOSIS — E039 Hypothyroidism, unspecified: Secondary | ICD-10-CM | POA: Diagnosis not present

## 2017-06-21 DIAGNOSIS — K219 Gastro-esophageal reflux disease without esophagitis: Secondary | ICD-10-CM | POA: Diagnosis not present

## 2017-06-21 DIAGNOSIS — G4700 Insomnia, unspecified: Secondary | ICD-10-CM | POA: Diagnosis not present

## 2017-06-21 DIAGNOSIS — Z96643 Presence of artificial hip joint, bilateral: Secondary | ICD-10-CM | POA: Diagnosis not present

## 2017-06-21 DIAGNOSIS — K5790 Diverticulosis of intestine, part unspecified, without perforation or abscess without bleeding: Secondary | ICD-10-CM | POA: Diagnosis not present

## 2017-06-21 DIAGNOSIS — Z79899 Other long term (current) drug therapy: Secondary | ICD-10-CM | POA: Diagnosis not present

## 2017-06-21 DIAGNOSIS — I10 Essential (primary) hypertension: Secondary | ICD-10-CM | POA: Diagnosis not present

## 2017-06-21 DIAGNOSIS — M19042 Primary osteoarthritis, left hand: Secondary | ICD-10-CM | POA: Diagnosis not present

## 2017-06-21 DIAGNOSIS — G4730 Sleep apnea, unspecified: Secondary | ICD-10-CM | POA: Diagnosis not present

## 2017-06-21 DIAGNOSIS — G8929 Other chronic pain: Secondary | ICD-10-CM | POA: Diagnosis not present

## 2017-06-21 DIAGNOSIS — N400 Enlarged prostate without lower urinary tract symptoms: Secondary | ICD-10-CM | POA: Diagnosis not present

## 2017-06-21 DIAGNOSIS — Z886 Allergy status to analgesic agent status: Secondary | ICD-10-CM | POA: Diagnosis not present

## 2017-06-21 DIAGNOSIS — M170 Bilateral primary osteoarthritis of knee: Secondary | ICD-10-CM | POA: Diagnosis not present

## 2017-06-21 DIAGNOSIS — M5136 Other intervertebral disc degeneration, lumbar region: Secondary | ICD-10-CM | POA: Diagnosis not present

## 2017-06-21 DIAGNOSIS — M2142 Flat foot [pes planus] (acquired), left foot: Secondary | ICD-10-CM | POA: Diagnosis not present

## 2017-06-21 DIAGNOSIS — Z6841 Body Mass Index (BMI) 40.0 and over, adult: Secondary | ICD-10-CM | POA: Diagnosis not present

## 2017-06-21 DIAGNOSIS — E669 Obesity, unspecified: Secondary | ICD-10-CM | POA: Diagnosis not present

## 2017-06-21 DIAGNOSIS — M76822 Posterior tibial tendinitis, left leg: Secondary | ICD-10-CM | POA: Diagnosis not present

## 2017-06-21 MED FILL — oxyCODONE HCL 5 MG TABS: 5 | 7 days supply | Qty: 40 | Fill #0

## 2017-06-21 MED FILL — ASPIRIN ADULT LOW STRENGTH: 81 | 30 days supply | Qty: 30 | Fill #0 | Status: TO

## 2017-06-21 MED FILL — ONDANSETRON ODT 4 MG TABLET: 4 | 7 days supply | Qty: 20 | Fill #0

## 2017-06-22 ENCOUNTER — Other Ambulatory Visit (INDEPENDENT_AMBULATORY_CARE_PROVIDER_SITE_OTHER): Payer: Self-pay | Admitting: Orthopaedic Surgery

## 2017-06-22 HISTORY — PX: FOOT FUSION: SHX956

## 2017-06-22 MED ORDER — METHOCARBAMOL 500 MG PO TABS: 500.0000 mg | ORAL_TABLET | Freq: Three times a day (TID) | ORAL | 1 refills | Status: DC | PRN

## 2017-06-22 MED FILL — METHOCARBAMOL 500 MG TABS: 500 | 10 days supply | Qty: 30 | Fill #0

## 2017-07-01 DIAGNOSIS — M25572 Pain in left ankle and joints of left foot: Secondary | ICD-10-CM | POA: Diagnosis not present

## 2017-07-02 ENCOUNTER — Ambulatory Visit (INDEPENDENT_AMBULATORY_CARE_PROVIDER_SITE_OTHER): Payer: Medicare Other

## 2017-07-02 ENCOUNTER — Encounter (INDEPENDENT_AMBULATORY_CARE_PROVIDER_SITE_OTHER): Payer: Self-pay | Admitting: Orthopaedic Surgery

## 2017-07-02 ENCOUNTER — Ambulatory Visit (INDEPENDENT_AMBULATORY_CARE_PROVIDER_SITE_OTHER): Payer: Medicare Other | Admitting: Orthopaedic Surgery

## 2017-07-02 DIAGNOSIS — M25572 Pain in left ankle and joints of left foot: Secondary | ICD-10-CM

## 2017-07-02 NOTE — Progress Notes (Signed)
Wound Care Note   Patient: Jonathan Powers           Date of Birth: 01-14-1951           MRN: [id number]             PCP: [name], MD Visit Date: 07/02/2017   Assessment & Plan: Visit Diagnoses:  1. Pain in left ankle and joints of left foot     Plan: 11 days status post left foot surgery involving fusion of the subtalar joint and the talonavicular joint performed at [facility]  Has fallen several times off his rolling scooter and is concerned about a possible fracture.  Still having some numbness in the dorsum of his foot probably related to surgery or to his nerve block.  Dressing was removed.  Still has some swelling in the dorsum of his foot.  Some altered sensibility of the dorsum of his foot and toes but does have a quite a bit of pain in the first metatarsal phalangeal  joint.  Could be having a gout attack.  Will place him on colchicine.  X-rays today revealed no evidence of acute injury. new dressing and posterior splint applied.  Has follow-up appointment at [facility] on Wednesday  Follow-Up Instructions: Return if symptoms worsen or fail to improve.  Orders:  Orders Placed This Encounter  Procedures  . XR Foot Complete Left  . XR Ankle 2 Views Left   No orders of the defined types were placed in this encounter.     Procedures: No notes on file   Clinical Data: No additional findings.   No images are attached to the encounter.   Subjective: No chief complaint on file. 11 days status post left foot surgery performed at [facility] by Dr. Para March.  He has had a subtalar and talonavicular joint fusion.  Yotam has fallen several times off his rolling walker fracture of the lateral aspect of his leg and ankle.  HPI  Review of Systems   Objective: Vital Signs: There were no vitals taken for this visit.  Physical Exam:   Specialty Comments: No specialty comments available.  Awake alert and oriented x3.  Dressing and splint left lower extremity removed.  Wounds are  healing without evidence of infection.  Moderate swelling dorsum of left foot.  Still has some altered sensibility along the dorsum of his foot and toes.  Motor exam appears to be intact although weak.  Has considerable tenderness about the metatarsal phalangeal joint of great toe which could be gout.  No pain over the proximal fibula.  It is over the lateral malleolus.  X-rays negative for acute fracture.   PMFS History: Patient Active Problem List   Diagnosis Date Noted  . SOB (shortness of breath) 02/19/2017  . Routine general medical examination at a health care facility 10/12/2016  . Chronic right-sided low back pain without sciatica 07/21/2016  . Low testosterone 07/21/2016  . Idiopathic chronic gout of multiple sites without tophus 07/21/2016  . Superior mesenteric artery aneurysm ([place]) 07/12/2015  . Left thyroid nodule   . OSA (obstructive sleep apnea) 01/27/2013  . Hypothyroid   . Symptomatic PVCs 09/06/2012  . Dyslipidemia 09/06/2012  . BPH (benign prostatic hypertrophy) 09/06/2012  . OBESITY NOS 05/17/2006  . Essential hypertension 05/17/2006  . ALLERGIC RHINITIS 05/17/2006  . OSTEOARTHRITIS 05/17/2006   Past Medical History:  Diagnosis Date  . ALLERGIC RHINITIS   . CELLULITIS, LEG, RIGHT    Recurrent R hip cellulitis 1/08,3/08   .  Diverticulitis   . GERD (gastroesophageal reflux disease)   . Gout   . Hashimoto's thyroiditis   . Hypertension   . Hypogonadism male    low T, a/w ED  . Hypothyroid   . Left thyroid nodule 2008   on [place], decrease size in 2011 [place]  . Migraines    Atypical and ocular  . OSTEOARTHRITIS   . Seasonal allergies     Family History  Problem Relation Age of Onset  . Arthritis Father   . Heart disease Father   . Diabetes Father   . Vascular Disease Father        AoBifem  . Arthritis Mother   . Diabetes Mother   . Multiple sclerosis Mother   . Cancer Paternal Grandmother        "GI"  . Cancer Paternal Grandfather        stomach cancer   . Thyroid cancer Sister   . Uterine cancer Sister    Past Surgical History:  Procedure Laterality Date  . CARPAL TUNNEL RELEASE Left 03/30/2017   Procedure: CARPAL TUNNEL RELEASE;  Surgeon: Garald Balding, MD;  Location: [facility];  Service: Orthopedics;  Laterality: Left;  . CHOLECYSTECTOMY  2004  . CTR Bilateral 1982  . CYSTOSCOPY  1974  . HEMORRHOID SURGERY N/A 03/30/2017   Procedure: HEMORRHOIDECTOMY;  Surgeon: Coralie Keens, MD;  Location: [facility];  Service: General;  Laterality: N/A;  . open reduction L little finger Left 1970  . Repair digital thumb, left Left 1990  . Right shoulder cuff repair Right 09/15/11  . Right shoulder SAD, DCR Right 02/05/11  . TOTAL HIP ARTHROPLASTY Left 07/26/06  . TOTAL HIP ARTHROPLASTY Right 1999   Social History   Occupational History  . Occupation: Orthopedic PA    Employer: [employer]  Tobacco Use  . Smoking status: Never Smoker  . Smokeless tobacco: Never Used  Substance and Sexual Activity  . Alcohol use: Yes    Alcohol/week: 0.0 oz    Comment: rare  . Drug use: No  . Sexual activity: Not on file

## 2017-07-11 DIAGNOSIS — M76822 Posterior tibial tendinitis, left leg: Secondary | ICD-10-CM | POA: Diagnosis not present

## 2017-07-13 ENCOUNTER — Other Ambulatory Visit: Payer: Self-pay | Admitting: Internal Medicine

## 2017-07-13 ENCOUNTER — Other Ambulatory Visit: Payer: Self-pay | Admitting: Rheumatology

## 2017-07-13 DIAGNOSIS — E041 Nontoxic single thyroid nodule: Secondary | ICD-10-CM

## 2017-07-13 DIAGNOSIS — E038 Other specified hypothyroidism: Secondary | ICD-10-CM

## 2017-07-13 MED FILL — GABAPENTIN 300 MG CAPSULE: 300 | 90 days supply | Qty: 90 | Fill #0

## 2017-07-13 MED FILL — METHOCARBAMOL 500 MG TABS: 500 | 10 days supply | Qty: 30 | Fill #1

## 2017-07-13 NOTE — Telephone Encounter (Signed)
Last Visit: 07/21/16 Next Visitwas due September 2018 UDS: 03/08/17 Narc Agreement: 03/08/17  Okay to refill Tramadol?

## 2017-07-17 NOTE — Telephone Encounter (Signed)
Pt needs an appt

## 2017-07-17 NOTE — Telephone Encounter (Signed)
ok 

## 2017-07-18 IMAGING — MR MR SHOULDER*L* W/O CM
4 of 5 series · 14 of 40 positions shown · non-contrast
Comparison: 08/09/2012

CLINICAL DATA: Chronic neck pain with left shoulder pain and
numbness down the left arm

EXAM:
MRI OF THE LEFT SHOULDER WITHOUT CONTRAST
TECHNIQUE: Multiplanar, multisequence MR imaging of the shoulder was performed.
No intravenous contrast was administered.

[Series 6: T2 fat-sat · axial · left · 3.0mm · 0.44mm/px · z∈[-104,-48]mm · 3 of 25 slices shown (1 of 3)]
[im 4/25]
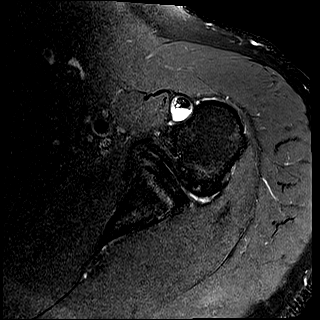
[im 14/25]
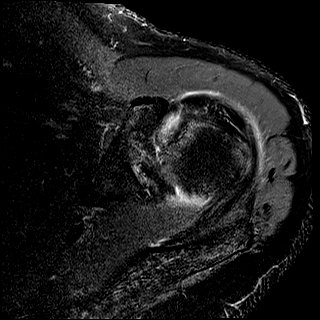
[im 21/25]
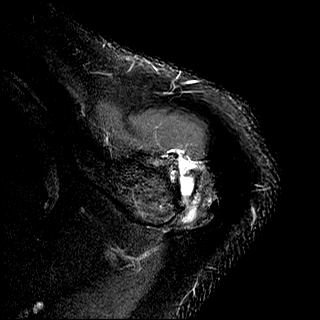

[Series 8: PD · oblique · left · 3.0mm · 0.18mm/px · 5 of 21 slices shown]
[im 1/21]
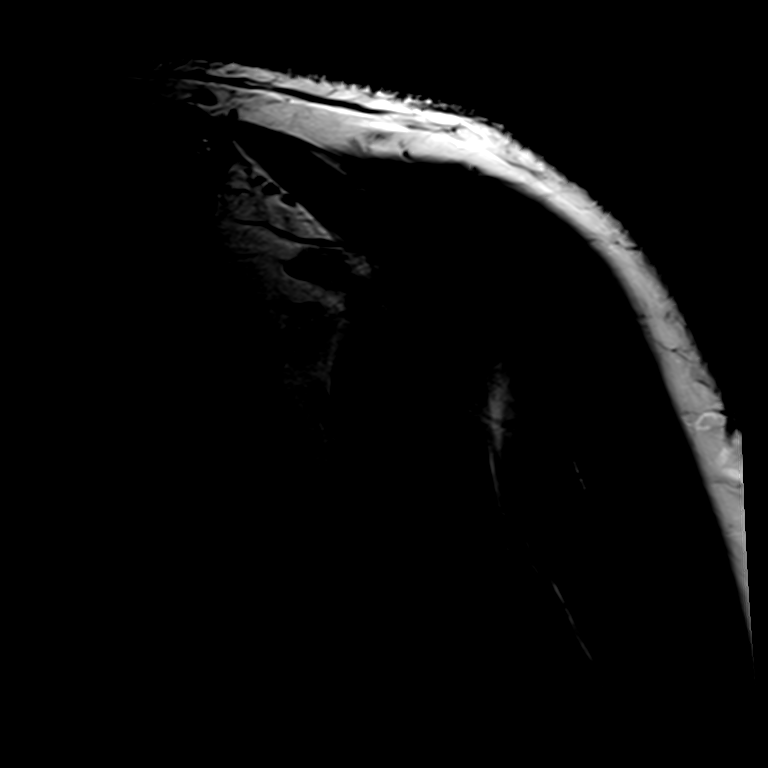
[im 3/21]
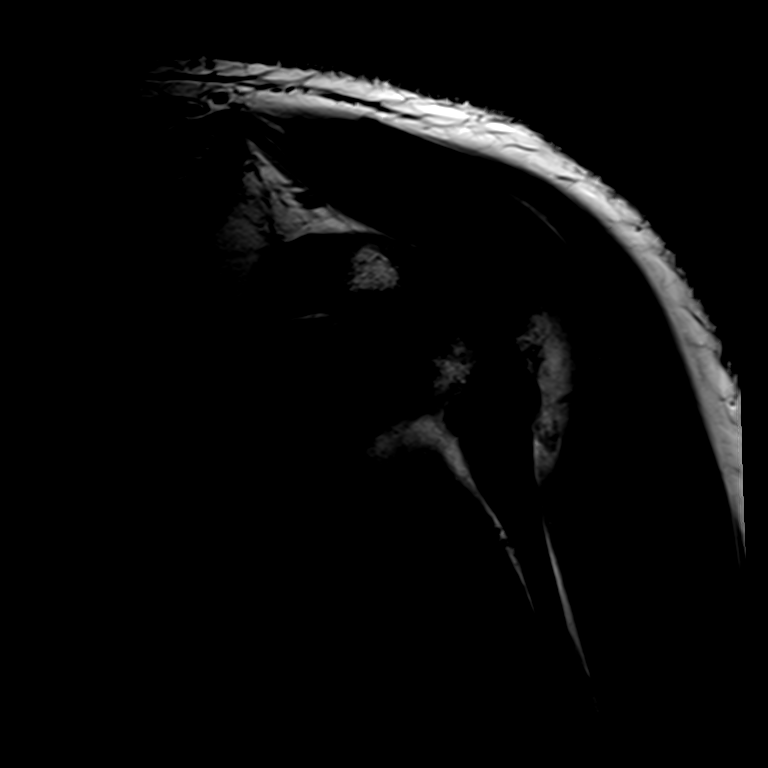
[im 6/21]
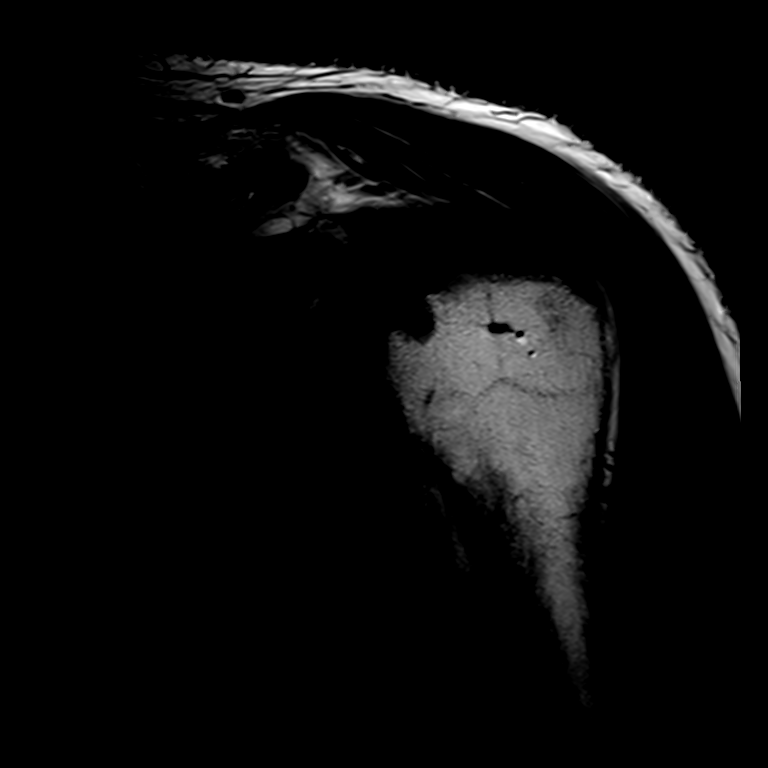
[im 12/21]
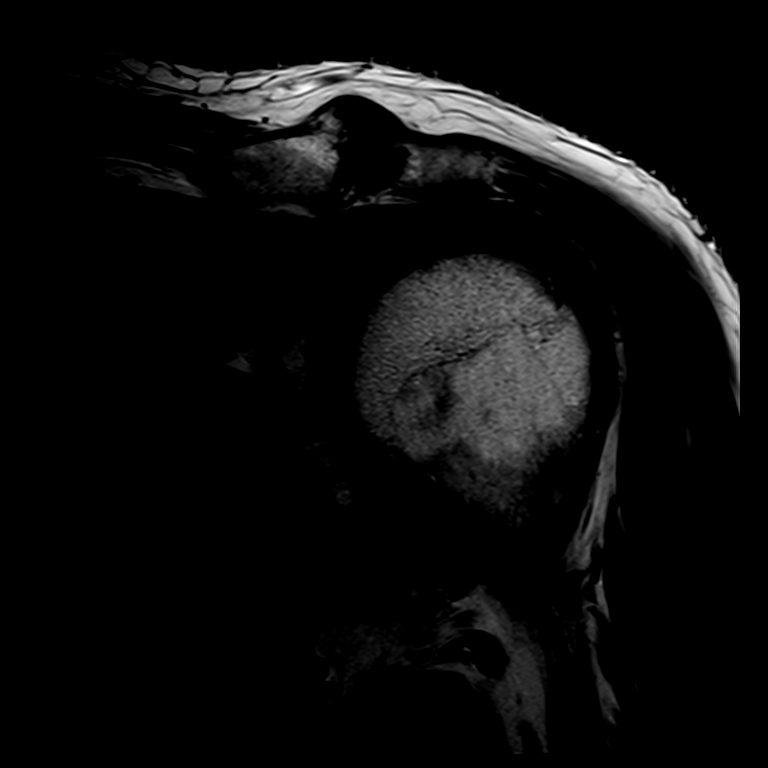
[im 18/21]
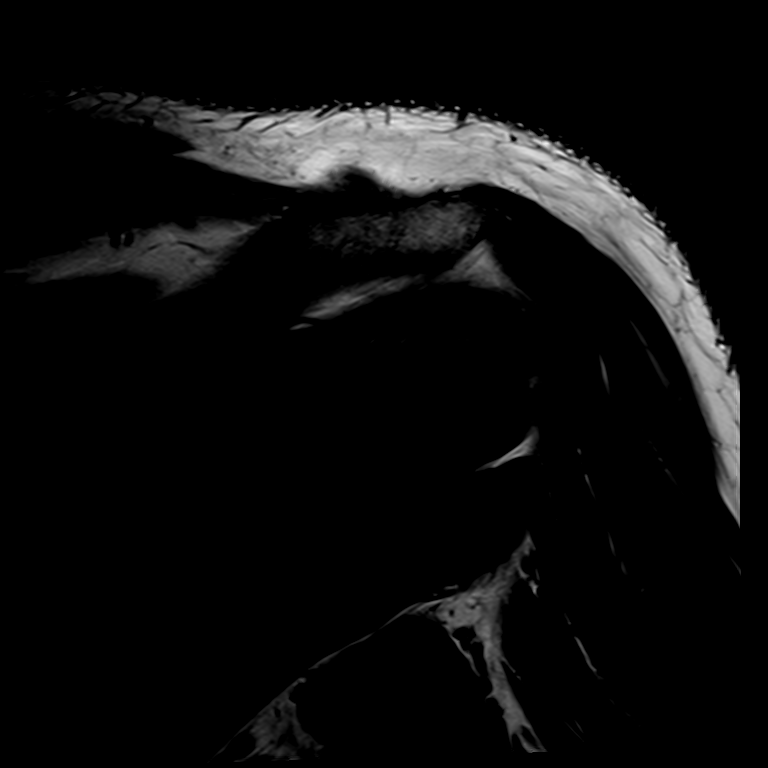

[Series 9: T2 fat-sat · oblique · left · 3.0mm · 0.22mm/px · 3 of 21 slices shown (2 of 3)]
[im 3/21]
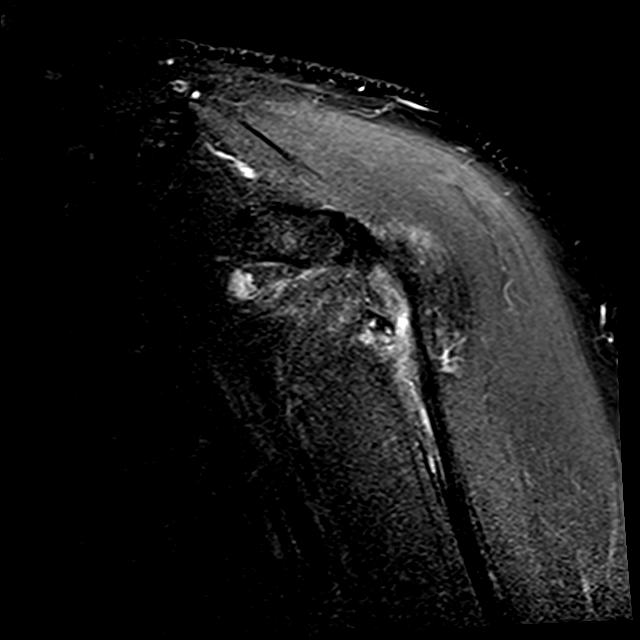
[im 12/21]
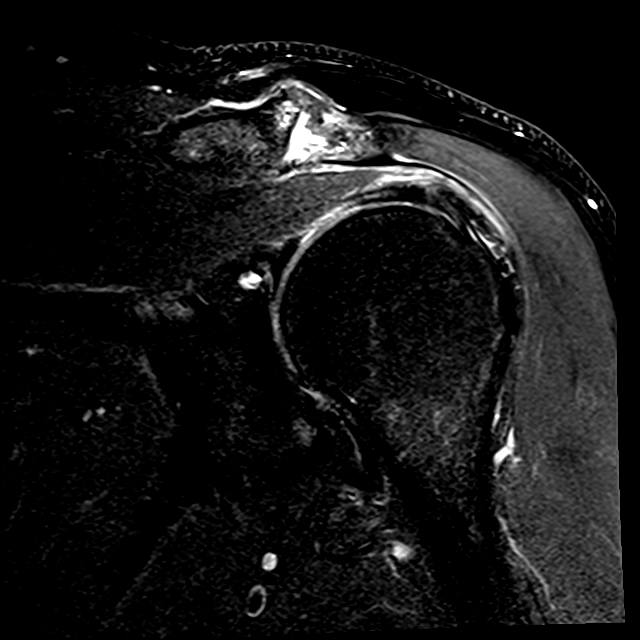
[im 18/21]
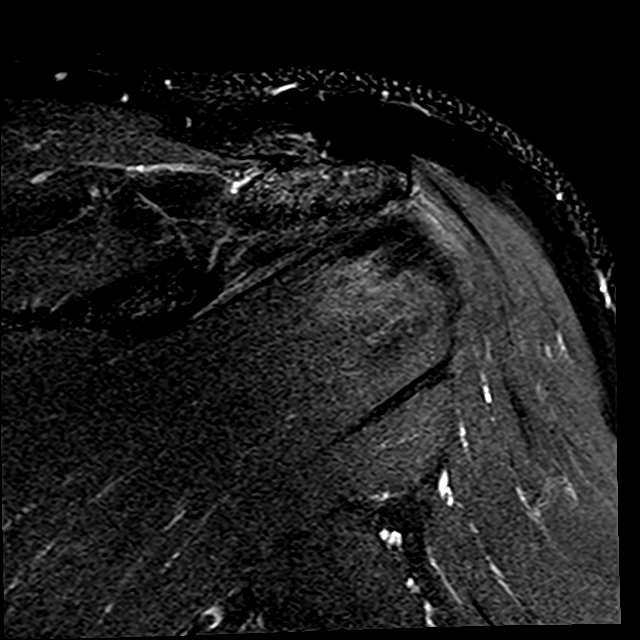

[Series 11: T2 fat-sat · oblique · left · 3.0mm · 0.44mm/px · 3 of 21 slices shown (3 of 3)]
[im 3/21]
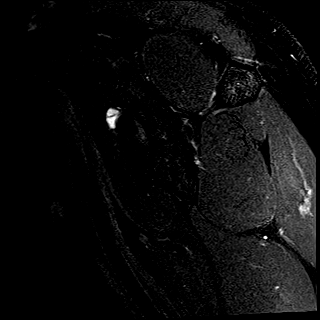
[im 12/21]
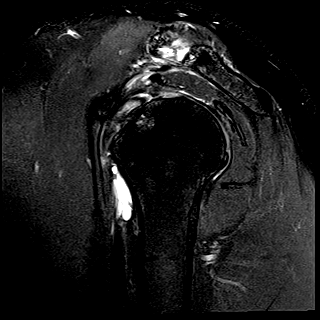
[im 18/21]
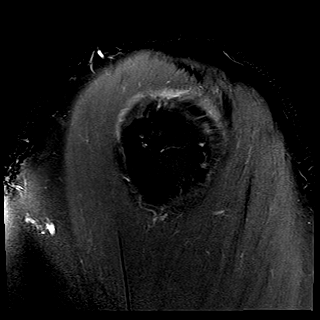

[14 of 40 positions shown; findings below may reference images not displayed]

FINDINGS: Rotator cuff: Mild tendinosis of the supraspinatus tendon with a
small partial-thickness articular surface tear anteriorly.
Infraspinatus tendon is intact. Teres minor tendon is intact.
Subscapularis tendon is intact.

Muscles: No atrophy or fatty replacement of nor abnormal signal
within, the muscles of the rotator cuff.

Biceps long head: Severe tendinosis of the intraarticular portion of
the long head of the biceps tendon with a small partial tear of the
proximal extra-articular portion of the long head of the biceps
tendon.

Acromioclavicular Joint: Moderate arthropathy of the
acromioclavicular joint. Type I acromion. Trace subacromial/
subdeltoid bursal fluid.

Glenohumeral Joint: No joint effusion. Mild partial-thickness
cartilage loss of the glenohumeral joint.

Labrum:  Superior posterior labral degeneration.

Bones:  No marrow signal abnormality.  No fracture or dislocation.

Other: No fluid collection or hematoma.
IMPRESSION: 1. Severe tendinosis of the intraarticular portion of the long head
of the biceps tendon with a small partial tear of the proximal
extra-articular portion of the long head of the biceps tendon.
2. Mild tendinosis of the supraspinatus tendon with a small
partial-thickness articular surface tear anteriorly.
3. Mild osteoarthritis of the left glenohumeral joint.

## 2017-07-18 IMAGING — MR MR CERVICAL SPINE W/O CM
5 series · 28 of 48 positions shown · non-contrast
Comparison: 02/19/2013.

CLINICAL DATA: LEFT shoulder pain. Chronic neck pain. Numbness down
LEFT arm for 1 month. Numbness extends to middle finger.

EXAM:
MRI CERVICAL SPINE WITHOUT CONTRAST
TECHNIQUE: Multiplanar, multisequence MR imaging of the cervical spine was
performed. No intravenous contrast was administered.

[Series 5: T1 · sagittal · 3.0mm · 0.66mm/px · 6 of 15 slices shown]
[im 1/15]
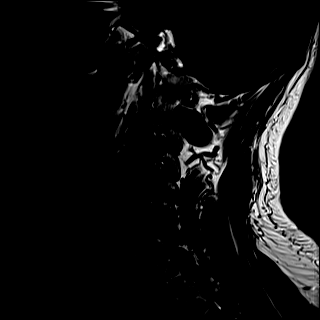
[im 3/15]
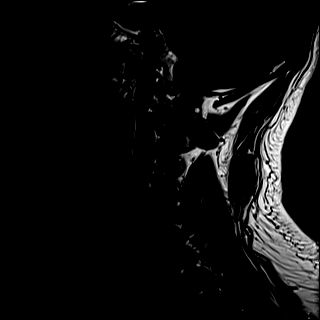
[im 6/15]
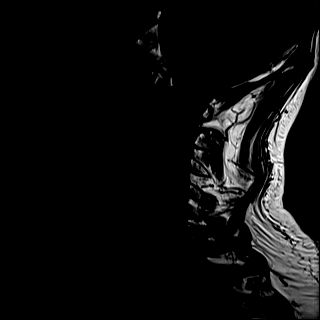
[im 9/15]
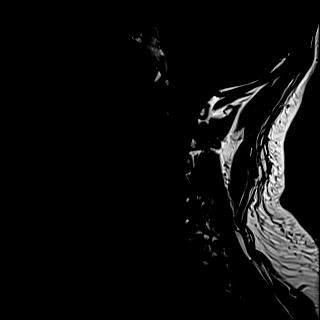
[im 12/15]
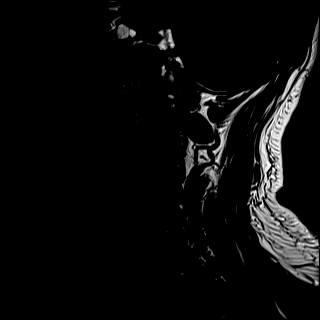
[im 15/15]
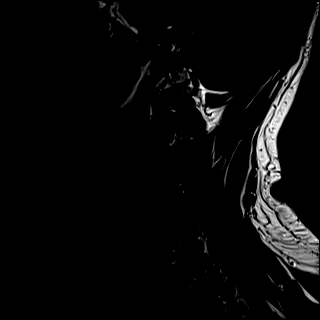

[Series 6: T2 · sagittal · 3.0mm · 0.55mm/px · 6 of 15 slices shown (1 of 2)]
[im 1/15]
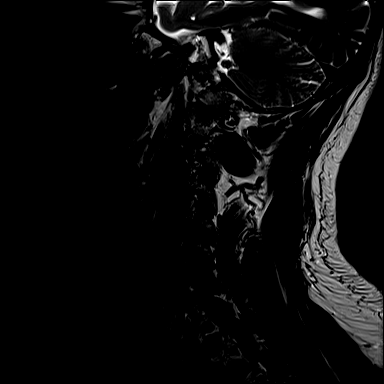
[im 3/15]
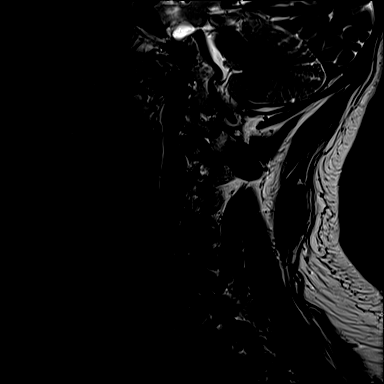
[im 6/15]
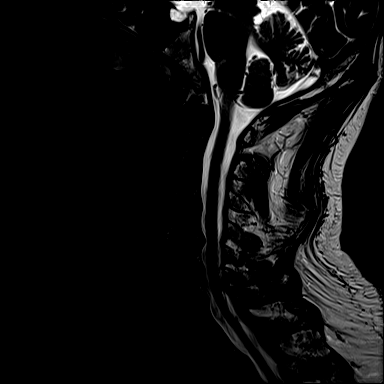
[im 9/15]
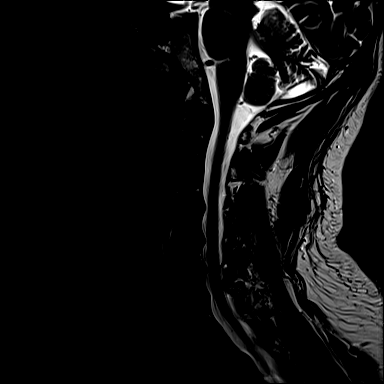
[im 12/15]
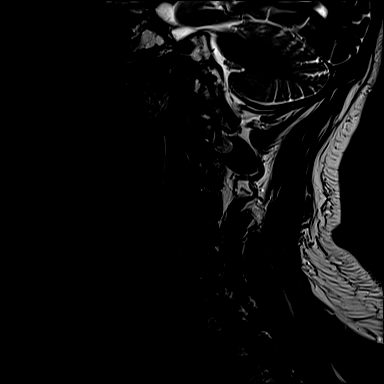
[im 15/15]
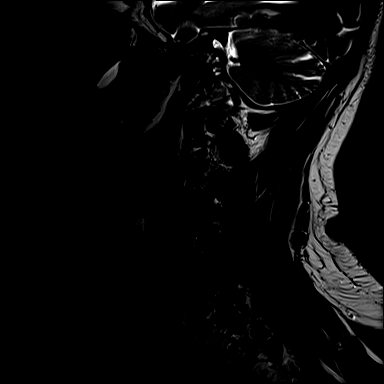

[Series 7: STIR · sagittal · 3.0mm · 0.33mm/px · 6 of 15 slices shown]
[im 1/15]
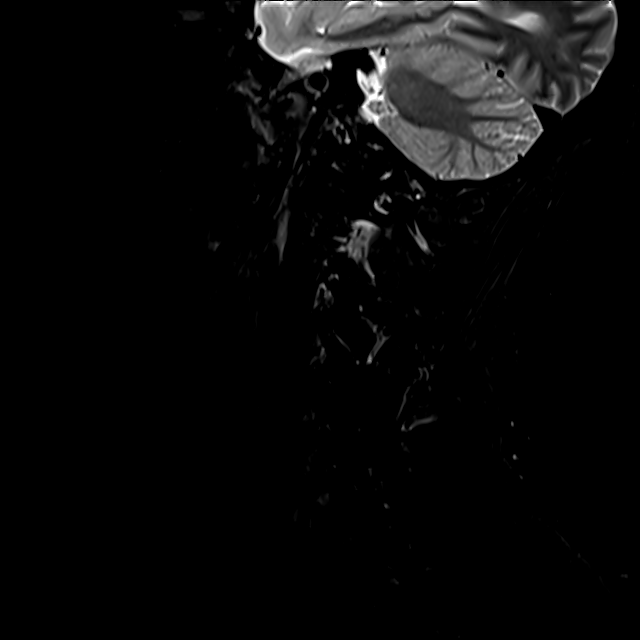
[im 3/15]
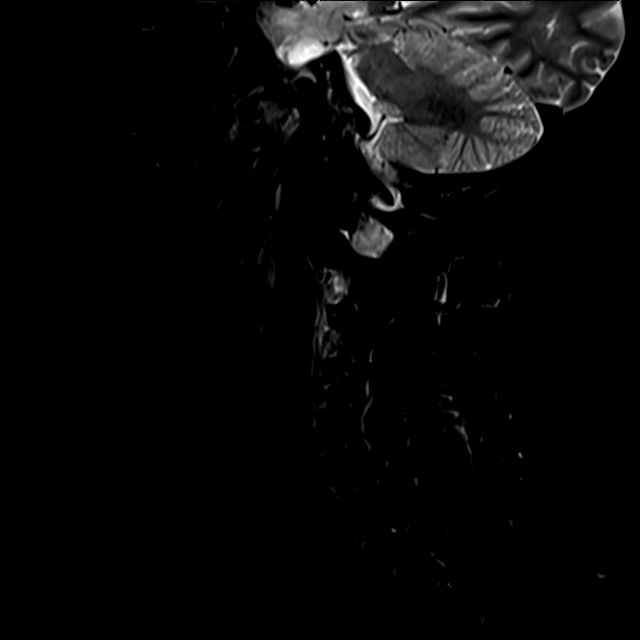
[im 6/15]
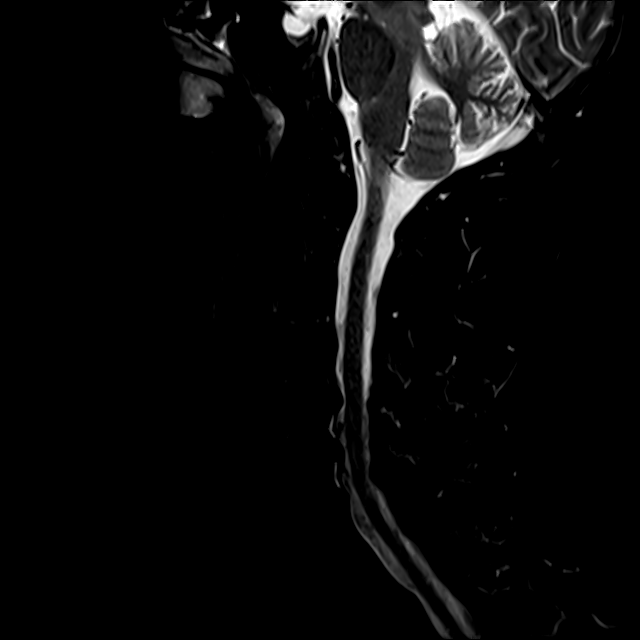
[im 9/15]
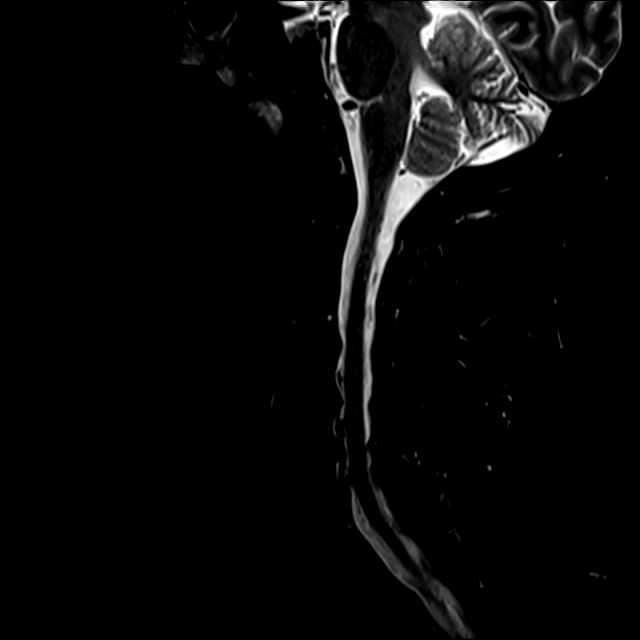
[im 12/15]
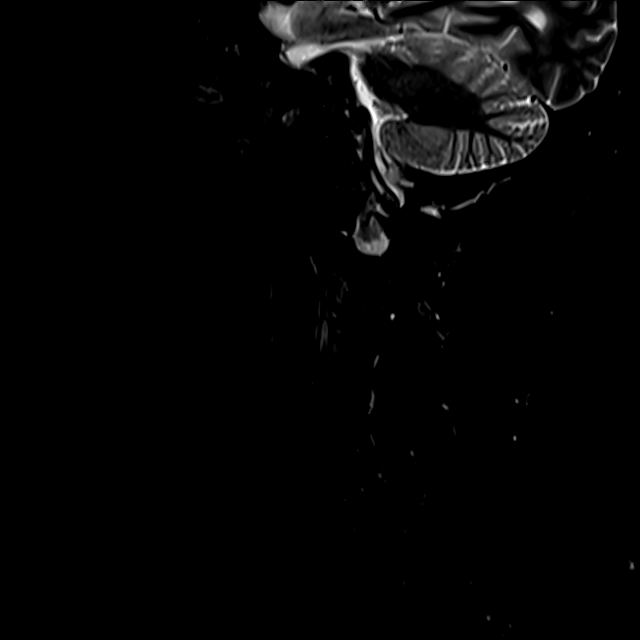
[im 15/15]
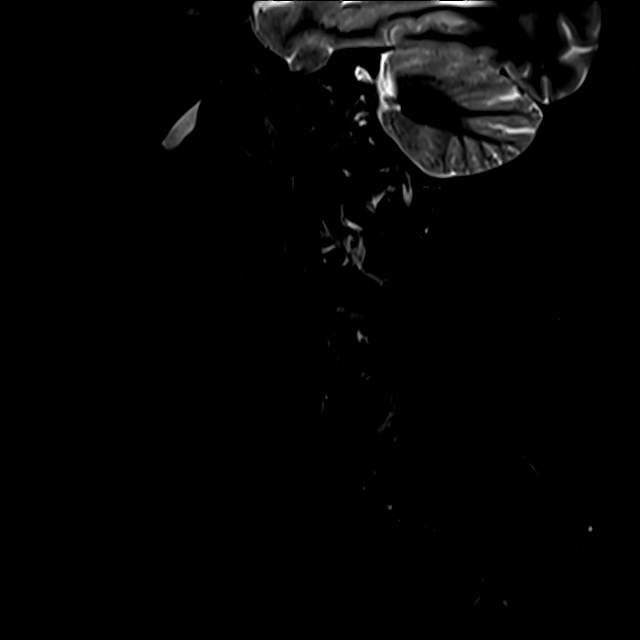

[Series 8: T2 · axial · 3.0mm · 0.50mm/px · z∈[-76,+34]mm · 9 of 35 slices shown (2 of 2)]
[im 1/35]
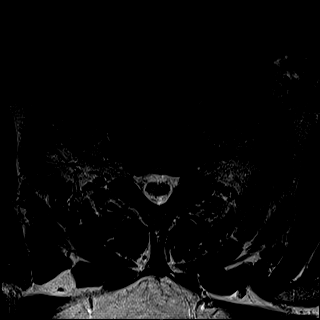
[im 5/35]
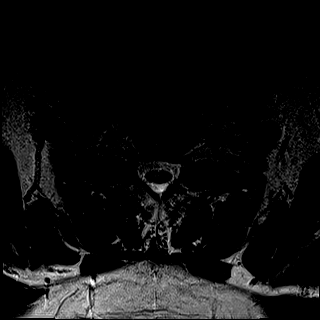
[im 10/35]
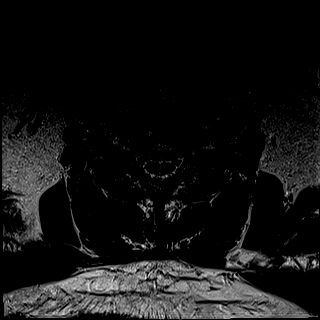
[im 15/35]
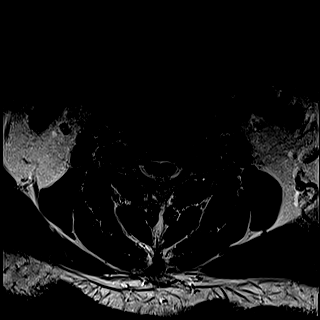
[im 18/35]
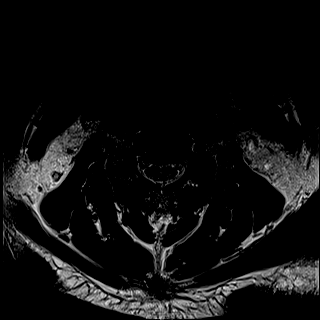
[im 20/35]
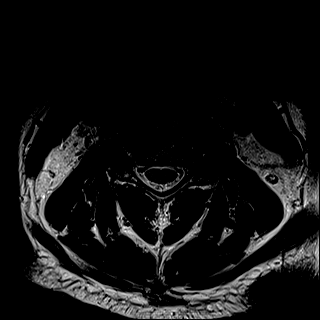
[im 25/35]
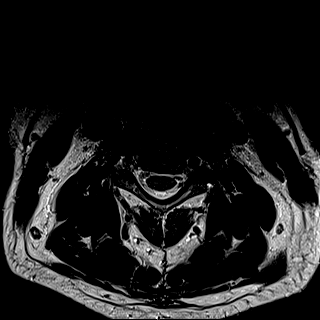
[im 30/35]
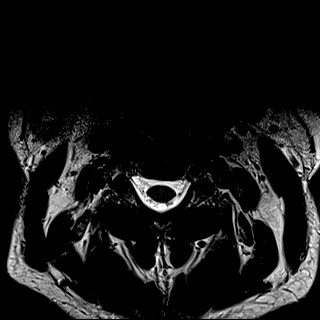
[im 35/35]
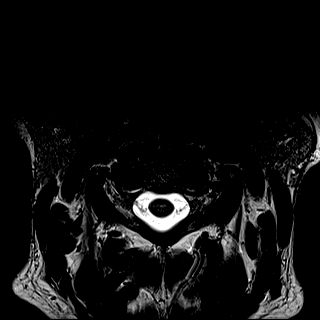

[Series 9: GRE · axial · 3.0mm · 0.42mm/px · 1 of 35 slices shown]
[im 1/35]
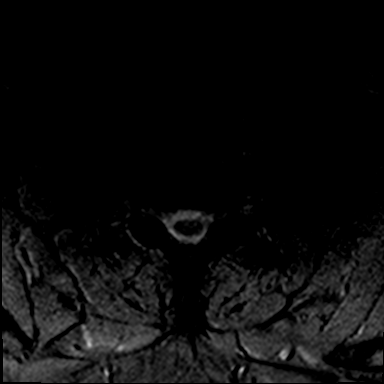

[28 of 48 positions shown; findings below may reference images not displayed]

FINDINGS: Alignment: Physiologic.Bold

Vertebrae: No fracture, evidence of discitis, or bone lesion.
Endplate reactive changes above and below C4-5, C5-6, and C6-7.

Cord: Normal signal and morphology.

Posterior Fossa, vertebral arteries, paraspinal tissues: Negative.

Disc levels:

C2-3:  Normal disc space.

C3-4:  Annular bulge.  Mild facet arthropathy.  No impingement.

C4-5: Disc space narrowing. Facet arthropathy. Annular bulge. Disc
osteophyte complex. No central canal stenosis. BILATERAL RIGHT
greater than LEFT C5 foraminal narrowing.

C5-6: Disc space narrowing. Annular bulge. Osseous spurring. Mild
stenosis. BILATERAL C6 foraminal narrowing slightly worse on the
LEFT.

C6-7: Disc space narrowing. Annular bulge. Osseous spurring
eccentric to the LEFT. There may be a small soft disc protrusion or
uncinate spur in the foramen, see series 6, image 11, axial image 26
series 8.

C7-T1:  Unremarkable.

Compared with 3916, there is more disc space narrowing at C4-5, as
well as more endplate reactive changes from C4 through C7.
IMPRESSION: Mild progression of spondylosis since 3916. Disc space narrowing
with potentially symptomatic neural impingement at C4-5, C5-6, and
C6-7, as described above.

With regard to the distribution of LEFT-sided numbness, with middle
finger usually representing C7 distribution, the LEFT foraminal
narrowing at C6-7 does not appear as severe as that at C4-5 and
C5-6. If further investigation desired, cervical myelogram and
postmyelogram CT could be performed for further evaluation.

## 2017-07-19 MED ORDER — TRAMADOL HCL 50 MG PO TABS: 50.0000 mg | ORAL_TABLET | Freq: Three times a day (TID) | ORAL | 0 refills | Status: DC | PRN

## 2017-07-19 MED FILL — traMADol HCL 50 MG TABS: 50 | 7 days supply | Qty: 21 | Fill #0 | Status: TO

## 2017-07-20 MED ORDER — LEVOTHYROXINE SODIUM 175 MCG PO TABS: 175.0000 ug | ORAL_TABLET | Freq: Every day | ORAL | 0 refills | Status: DC

## 2017-07-20 NOTE — Telephone Encounter (Signed)
erx has been sent in.  °

## 2017-07-20 NOTE — Telephone Encounter (Signed)
Pt scheduled 6/4

## 2017-07-24 ENCOUNTER — Encounter: Payer: Self-pay | Admitting: Internal Medicine

## 2017-07-24 ENCOUNTER — Ambulatory Visit (INDEPENDENT_AMBULATORY_CARE_PROVIDER_SITE_OTHER): Payer: Medicare Other | Admitting: Internal Medicine

## 2017-07-24 ENCOUNTER — Other Ambulatory Visit (INDEPENDENT_AMBULATORY_CARE_PROVIDER_SITE_OTHER): Payer: Medicare Other

## 2017-07-24 VITALS — BP 130/80 | HR 60 | Temp 98.9°F | Resp 16 | Ht 71.0 in | Wt 272.8 lb

## 2017-07-24 DIAGNOSIS — E785 Hyperlipidemia, unspecified: Secondary | ICD-10-CM

## 2017-07-24 DIAGNOSIS — L2082 Flexural eczema: Secondary | ICD-10-CM | POA: Insufficient documentation

## 2017-07-24 DIAGNOSIS — E039 Hypothyroidism, unspecified: Secondary | ICD-10-CM

## 2017-07-24 DIAGNOSIS — N400 Enlarged prostate without lower urinary tract symptoms: Secondary | ICD-10-CM | POA: Diagnosis not present

## 2017-07-24 DIAGNOSIS — E038 Other specified hypothyroidism: Secondary | ICD-10-CM

## 2017-07-24 DIAGNOSIS — I10 Essential (primary) hypertension: Secondary | ICD-10-CM

## 2017-07-24 DIAGNOSIS — E041 Nontoxic single thyroid nodule: Secondary | ICD-10-CM

## 2017-07-24 HISTORY — DX: Flexural eczema: L20.82

## 2017-07-24 LAB — LIPID PANEL
Cholesterol: 179 mg/dL (ref 0–200)
HDL: 39.7 mg/dL (ref >39.00)
LDL CALC: 104 mg/dL — AB (ref 0–99)
NONHDL: 139.28
Total CHOL/HDL Ratio: 5
Triglycerides: 174 mg/dL — ABNORMAL HIGH (ref 0.0–149.0)
VLDL: 34.8 mg/dL (ref 0.0–40.0)

## 2017-07-24 LAB — TSH: TSH: 1.2 u[IU]/mL (ref 0.35–4.50)

## 2017-07-24 MED ORDER — FLUOCINONIDE-E 0.05 % EX CREA: 1.0000 "application " | TOPICAL_CREAM | Freq: Two times a day (BID) | CUTANEOUS | 3 refills | Status: DC

## 2017-07-24 MED ORDER — LEVOTHYROXINE SODIUM 175 MCG PO TABS
175.0000 ug | ORAL_TABLET | Freq: Every day | ORAL | 1 refills | Status: DC
Start: 2017-07-24 — End: 2018-06-03

## 2017-07-24 MED ORDER — ROSUVASTATIN CALCIUM 5 MG PO TABS: 5.0000 mg | ORAL_TABLET | Freq: Every day | ORAL | 1 refills | Status: DC

## 2017-07-24 MED FILL — ROSUVASTATIN CALCIUM 5 MG T: 5 | 90 days supply | Qty: 90 | Fill #0 | Status: TO

## 2017-07-24 NOTE — Patient Instructions (Signed)
Hypothyroidism Hypothyroidism is a disorder of the thyroid. The thyroid is a large gland that is located in the lower front of the neck. The thyroid releases hormones that control how the body works. With hypothyroidism, the thyroid does not make enough of these hormones. What are the causes? Causes of hypothyroidism may include:  Viral infections.  Pregnancy.  Your own defense system (immune system) attacking your thyroid.  Certain medicines.  Birth defects.  Past radiation treatments to your head or neck.  Past treatment with radioactive iodine.  Past surgical removal of part or all of your thyroid.  Problems with the gland that is located in the center of your brain (pituitary).  What are the signs or symptoms? Signs and symptoms of hypothyroidism may include:  Feeling as though you have no energy (lethargy).  Inability to tolerate cold.  Weight gain that is not explained by a change in diet or exercise habits.  Dry skin.  Coarse hair.  Menstrual irregularity.  Slowing of thought processes.  Constipation.  Sadness or depression.  How is this diagnosed? Your health care provider may diagnose hypothyroidism with blood tests and ultrasound tests. How is this treated? Hypothyroidism is treated with medicine that replaces the hormones that your body does not make. After you begin treatment, it may take several weeks for symptoms to go away. Follow these instructions at home:  Take medicines only as directed by your health care provider.  If you start taking any new medicines, tell your health care provider.  Keep all follow-up visits as directed by your health care provider. This is important. As your condition improves, your dosage needs may change. You will need to have blood tests regularly so that your health care provider can watch your condition. Contact a health care provider if:  Your symptoms do not get better with treatment.  You are taking thyroid  replacement medicine and: ? You sweat excessively. ? You have tremors. ? You feel anxious. ? You lose weight rapidly. ? You cannot tolerate heat. ? You have emotional swings. ? You have diarrhea. ? You feel weak. Get help right away if:  You develop chest pain.  You develop an irregular heartbeat.  You develop a rapid heartbeat. This information is not intended to replace advice given to you by your health care provider. Make sure you discuss any questions you have with your health care provider. Document Released: 02/06/2005 Document Revised: 07/15/2015 Document Reviewed: 06/24/2013 Elsevier Interactive Patient Education  2018 Elsevier Inc.  

## 2017-07-24 NOTE — Progress Notes (Signed)
Subjective:  Patient ID: Jonathan Powers, male    DOB: May 13, 1950  Age: 67 y.o. MRN: [id number]  CC: Hypothyroidism and Rash   HPI Jonathan Powers presents for f/up - he complains of a 1 week hx of mildly pruritic rash over both UE's. The rash started in his right antecubital fossa and has spread over the proximal surfaces. He is not aware of any new exposures or meds.  Outpatient Medications Prior to Visit  Medication Sig Dispense Refill  . acetaminophen (TYLENOL) 500 MG tablet Take 1,000 mg by mouth 2 (two) times daily.    . cetirizine (ZYRTEC) 10 MG tablet Take 10 mg by mouth daily.    . Cholecalciferol (VITAMIN D-3) 5000 UNITS TABS Take by mouth. Takes Monday thru friday    . ciclopirox (PENLAC) 8 % solution APPLY OVER NAIL & SURROUNDING SKIN, APPLY DAILY OVER PREVIOUS COAT. AFTER 7 DAYS, MAY REMOVE WITH ALCOHOL & CONTINUE CYCLE 6.6 mL 0  . diclofenac sodium (VOLTAREN) 1 % GEL 2-4 grams qid as needed for pain as discussed in office 10 Tube 12  . Esomeprazole Magnesium (NEXIUM 24HR PO) Take 1 tablet by mouth daily as needed.     . methocarbamol (ROBAXIN) 500 MG tablet Take 1 tablet (500 mg total) by mouth 3 (three) times daily as needed for muscle spasms. 30 tablet 1  . telmisartan (MICARDIS) 40 MG tablet TAKE 1 TABLET BY MOUTH DAILY. 90 tablet 1  . traMADol (ULTRAM) 50 MG tablet Take 1 tablet (50 mg total) by mouth 3 (three) times daily as needed. 30 tablet 0  . traZODone (DESYREL) 100 MG tablet TAKE 1/2-1 TABLET BY MOUTH AT BEDTIME AS NEEDED FOR SLEEP. 90 tablet 1  . valACYclovir (VALTREX) 1000 MG tablet   11  . levothyroxine (SYNTHROID, LEVOTHROID) 175 MCG tablet Take 1 tablet (175 mcg total) by mouth daily before breakfast. 30 tablet 0  . Testosterone 30 MG/ACT SOLN   5  . azithromycin (ZITHROMAX) 250 MG tablet As directed 6 each 0  . diclofenac sodium (VOLTAREN) 1 % GEL Apply 4 g topically 4 (four) times daily. Apply up to 8 areas qid 10 Tube 12  . doxycycline (VIBRAMYCIN) 50 MG  capsule Take 2 capsules (100 mg total) by mouth 2 (two) times daily. Take 1 tablet po bid 14 capsule 0  . hydroxychloroquine (PLAQUENIL) 200 MG tablet TAKE 1 TABLET BY MOUTH 2 TIMES DAILY. 60 tablet 2  . lidocaine (XYLOCAINE) 5 % ointment Apply 1 application topically as needed. 35.44 g 0  . oxyCODONE (OXY IR/ROXICODONE) 5 MG immediate release tablet Take 1-2 tablets (5-10 mg total) by mouth every 6 (six) hours as needed for moderate pain, severe pain or breakthrough pain. 30 tablet 0  . tadalafil (CIALIS) 5 MG tablet Take 1 tablet (5 mg total) by mouth daily as needed for erectile dysfunction. 18 tablet 0  . triamcinolone cream (KENALOG) 0.1 %      No facility-administered medications prior to visit.     ROS Review of Systems  Constitutional: Negative.  Negative for chills, diaphoresis, fatigue and fever.  HENT: Negative.   Eyes: Negative for visual disturbance.  Respiratory: Negative for cough, chest tightness, shortness of breath and wheezing.   Cardiovascular: Negative for chest pain, palpitations and leg swelling.  Gastrointestinal: Negative for abdominal pain, constipation, diarrhea, nausea and vomiting.  Endocrine: Negative.  Negative for cold intolerance and heat intolerance.  Genitourinary: Negative.  Negative for difficulty urinating.  Musculoskeletal: Positive for arthralgias. Negative for  back pain, myalgias and neck pain.  Skin: Positive for rash. Negative for color change, pallor and wound.  Neurological: Negative.  Negative for dizziness, weakness, light-headedness and headaches.  Hematological: Negative for adenopathy. Does not bruise/bleed easily.  Psychiatric/Behavioral: Negative.     Objective:  BP 130/80 (BP Location: Left Arm, Patient Position: Sitting, Cuff Size: Normal)   Pulse 60   Temp 98.9 F (37.2 C) (Oral)   Resp 16   Ht 5\' 11"  (1.803 m)   Wt 272 lb 12 oz (123.7 kg)   SpO2 97%   BMI 38.04 kg/m   BP Readings from Last 3 Encounters:  07/24/17 130/80    03/30/17 (!) 155/80  02/19/17 124/64    Wt Readings from Last 3 Encounters:  07/24/17 272 lb 12 oz (123.7 kg)  03/30/17 291 lb 6.4 oz (132.2 kg)  02/19/17 285 lb (129.3 kg)    Physical Exam  Constitutional: He is oriented to person, place, and time. No distress.  HENT:  Mouth/Throat: Oropharynx is clear and moist. No oropharyngeal exudate.  Eyes: Conjunctivae are normal. No scleral icterus.  Neck: Normal range of motion. Neck supple. No JVD present. No thyromegaly present.  Cardiovascular: Normal rate, regular rhythm and normal heart sounds. Exam reveals no gallop.  No murmur heard. Pulmonary/Chest: Effort normal and breath sounds normal. No respiratory distress. He has no wheezes. He has no rales.  Abdominal: Soft. Bowel sounds are normal. He exhibits no mass. There is no hepatosplenomegaly. There is no tenderness.  Musculoskeletal: Normal range of motion. He exhibits no edema, tenderness or deformity.  Lymphadenopathy:    He has no cervical adenopathy.  Neurological: He is alert and oriented to person, place, and time.  Skin: Skin is warm and dry. Rash noted. He is not diaphoretic. No pallor.  Scattered symmetrically over both upper extremities on the volar sides from the proximal forearm through the flexural surfaces onto the upper extremities there are scattered, blanching, mildly erythematous papules.  Psychiatric: He has a normal mood and affect. His behavior is normal. Judgment and thought content normal.  Vitals reviewed.   Lab Results  Component Value Date   WBC 6.2 01/03/2017   HGB 16.5 01/03/2017   HCT 46.6 01/03/2017   PLT 294 01/03/2017   GLUCOSE 78 01/03/2017   CHOL 179 07/24/2017   TRIG 174.0 (H) 07/24/2017   HDL 39.70 07/24/2017   LDLCALC 104 (H) 07/24/2017   ALT 16 01/03/2017   AST 17 01/03/2017   NA 140 01/03/2017   K 4.4 01/03/2017   CL 101 01/03/2017   CREATININE 0.87 01/03/2017   BUN 20 01/03/2017   CO2 30 01/03/2017   TSH 1.20 07/24/2017   PSA  0.61 07/03/2016   INR 2.4 (H) 07/29/2006   HGBA1C 5.6 09/29/2014    No results found.  Assessment & Plan:   Jonathan Powers was seen today for hypothyroidism and rash.  Diagnoses and all orders for this visit:  Essential hypertension- His blood pressure is adequately well controlled.  Acquired hypothyroidism- His TSH is in the normal range.  Will continue the current dose of levothyroxine. -     TSH; Future -     levothyroxine (SYNTHROID, LEVOTHROID) 175 MCG tablet; Take 1 tablet (175 mcg total) by mouth daily before breakfast.  Benign prostatic hyperplasia without lower urinary tract symptoms- He tells me he is followed by urology for this.  Dyslipidemia, goal LDL below 130- He has developed an elevated ASCVD risk score so I have asked him to start  taking a statin for CV risk reduction. -     Lipid panel; Future -     rosuvastatin (CRESTOR) 5 MG tablet; Take 1 tablet (5 mg total) by mouth daily.  Flexural eczema -     fluocinonide-emollient (LIDEX-E) 0.05 % cream; Apply 1 application topically 2 (two) times daily.  Other specified hypothyroidism  Left thyroid nodule   I have discontinued Domenik Trice. Gaby's tadalafil, triamcinolone cream, oxyCODONE, lidocaine, doxycycline, azithromycin, and hydroxychloroquine. I am also having him start on fluocinonide-emollient and rosuvastatin. Additionally, I am having him maintain his cetirizine, Esomeprazole Magnesium (NEXIUM 24HR PO), acetaminophen, Vitamin D-3, valACYclovir, Testosterone, ciclopirox, diclofenac sodium, traZODone, telmisartan, methocarbamol, traMADol, and levothyroxine.  Meds ordered this encounter  Medications  . fluocinonide-emollient (LIDEX-E) 0.05 % cream    Sig: Apply 1 application topically 2 (two) times daily.    Dispense:  60 g    Refill:  3  . levothyroxine (SYNTHROID, LEVOTHROID) 175 MCG tablet    Sig: Take 1 tablet (175 mcg total) by mouth daily before breakfast.    Dispense:  90 tablet    Refill:  1  .  rosuvastatin (CRESTOR) 5 MG tablet    Sig: Take 1 tablet (5 mg total) by mouth daily.    Dispense:  90 tablet    Refill:  1     Follow-up: No follow-ups on file.  Scarlette Calico, MD

## 2017-07-26 DIAGNOSIS — B360 Pityriasis versicolor: Secondary | ICD-10-CM | POA: Diagnosis not present

## 2017-08-03 DIAGNOSIS — M19072 Primary osteoarthritis, left ankle and foot: Secondary | ICD-10-CM | POA: Diagnosis not present

## 2017-08-03 DIAGNOSIS — M76822 Posterior tibial tendinitis, left leg: Secondary | ICD-10-CM | POA: Diagnosis not present

## 2017-09-04 DIAGNOSIS — H2513 Age-related nuclear cataract, bilateral: Secondary | ICD-10-CM | POA: Diagnosis not present

## 2017-09-04 DIAGNOSIS — H5213 Myopia, bilateral: Secondary | ICD-10-CM | POA: Diagnosis not present

## 2017-09-04 DIAGNOSIS — H52223 Regular astigmatism, bilateral: Secondary | ICD-10-CM | POA: Diagnosis not present

## 2017-09-04 DIAGNOSIS — H524 Presbyopia: Secondary | ICD-10-CM | POA: Diagnosis not present

## 2017-09-19 DIAGNOSIS — M79672 Pain in left foot: Secondary | ICD-10-CM | POA: Diagnosis not present

## 2017-09-19 DIAGNOSIS — M19072 Primary osteoarthritis, left ankle and foot: Secondary | ICD-10-CM | POA: Diagnosis not present

## 2017-09-19 DIAGNOSIS — M76822 Posterior tibial tendinitis, left leg: Secondary | ICD-10-CM | POA: Diagnosis not present

## 2017-10-10 ENCOUNTER — Other Ambulatory Visit: Payer: Self-pay | Admitting: *Deleted

## 2017-10-10 MED ORDER — TRAMADOL HCL 50 MG PO TABS: 50.0000 mg | ORAL_TABLET | Freq: Three times a day (TID) | ORAL | 0 refills | Status: DC | PRN

## 2017-10-10 MED FILL — traMADol HCL 50 MG TABS: 50 | 10 days supply | Qty: 30 | Fill #0

## 2017-10-10 NOTE — Telephone Encounter (Signed)
Last Visit: 07/21/16 UDS: 03/08/17 Narc Agreement: 03/08/17  Okay to refill Tramadol?

## 2017-10-10 NOTE — Telephone Encounter (Signed)
ok 

## 2017-10-19 DIAGNOSIS — N5201 Erectile dysfunction due to arterial insufficiency: Secondary | ICD-10-CM | POA: Diagnosis not present

## 2017-10-19 DIAGNOSIS — R948 Abnormal results of function studies of other organs and systems: Secondary | ICD-10-CM | POA: Diagnosis not present

## 2017-11-07 DIAGNOSIS — M79671 Pain in right foot: Secondary | ICD-10-CM | POA: Diagnosis not present

## 2017-11-07 DIAGNOSIS — M25572 Pain in left ankle and joints of left foot: Secondary | ICD-10-CM | POA: Diagnosis not present

## 2017-11-07 DIAGNOSIS — M2142 Flat foot [pes planus] (acquired), left foot: Secondary | ICD-10-CM | POA: Diagnosis not present

## 2017-11-07 DIAGNOSIS — M79672 Pain in left foot: Secondary | ICD-10-CM | POA: Diagnosis not present

## 2017-11-07 DIAGNOSIS — Q666 Other congenital valgus deformities of feet: Secondary | ICD-10-CM | POA: Diagnosis not present

## 2017-11-07 DIAGNOSIS — M76822 Posterior tibial tendinitis, left leg: Secondary | ICD-10-CM | POA: Diagnosis not present

## 2017-11-07 DIAGNOSIS — M2141 Flat foot [pes planus] (acquired), right foot: Secondary | ICD-10-CM | POA: Diagnosis not present

## 2017-11-07 DIAGNOSIS — M2140 Flat foot [pes planus] (acquired), unspecified foot: Secondary | ICD-10-CM | POA: Diagnosis not present

## 2017-12-03 ENCOUNTER — Other Ambulatory Visit: Payer: Self-pay | Admitting: Internal Medicine

## 2017-12-03 ENCOUNTER — Other Ambulatory Visit: Payer: Self-pay | Admitting: Physician Assistant

## 2017-12-03 MED ORDER — TRAMADOL HCL 50 MG PO TABS: 50.0000 mg | ORAL_TABLET | Freq: Three times a day (TID) | ORAL | 0 refills | Status: DC | PRN

## 2017-12-03 MED ORDER — DICLOFENAC SODIUM 1 % TD GEL: TRANSDERMAL | 12 refills | Status: DC

## 2017-12-03 NOTE — Progress Notes (Signed)
Patient came by the office to request refills of tramadol and voltaren gel.  He would like refills sent to [facility].

## 2017-12-05 ENCOUNTER — Ambulatory Visit (HOSPITAL_COMMUNITY)
Admission: RE | Admit: 2017-12-05 | Discharge: 2017-12-05 | Disposition: A | Discharge status: Home / Self Care | Payer: Medicare Other | Source: Ambulatory Visit | Attending: Orthopaedic Surgery | Admitting: Orthopaedic Surgery

## 2017-12-05 ENCOUNTER — Other Ambulatory Visit (INDEPENDENT_AMBULATORY_CARE_PROVIDER_SITE_OTHER): Payer: Self-pay | Admitting: *Deleted

## 2017-12-05 DIAGNOSIS — Z471 Aftercare following joint replacement surgery: Secondary | ICD-10-CM | POA: Diagnosis not present

## 2017-12-05 DIAGNOSIS — M25551 Pain in right hip: Secondary | ICD-10-CM

## 2017-12-05 DIAGNOSIS — Z96643 Presence of artificial hip joint, bilateral: Secondary | ICD-10-CM | POA: Diagnosis not present

## 2017-12-05 DIAGNOSIS — M47816 Spondylosis without myelopathy or radiculopathy, lumbar region: Secondary | ICD-10-CM | POA: Diagnosis not present

## 2017-12-05 DIAGNOSIS — M5441 Lumbago with sciatica, right side: Principal | ICD-10-CM

## 2017-12-05 DIAGNOSIS — M5442 Lumbago with sciatica, left side: Principal | ICD-10-CM

## 2017-12-05 DIAGNOSIS — G8929 Other chronic pain: Secondary | ICD-10-CM

## 2017-12-05 DIAGNOSIS — M533 Sacrococcygeal disorders, not elsewhere classified: Secondary | ICD-10-CM | POA: Diagnosis not present

## 2017-12-05 DIAGNOSIS — M48061 Spinal stenosis, lumbar region without neurogenic claudication: Secondary | ICD-10-CM | POA: Diagnosis not present

## 2018-01-08 ENCOUNTER — Other Ambulatory Visit: Payer: Self-pay

## 2018-01-08 ENCOUNTER — Encounter (HOSPITAL_COMMUNITY): Payer: Self-pay | Admitting: Emergency Medicine

## 2018-01-08 ENCOUNTER — Emergency Department (HOSPITAL_COMMUNITY)
Admission: EM | Admit: 2018-01-08 | Discharge: 2018-01-09 | Disposition: A | Discharge status: Home / Self Care | Payer: Medicare Other | Attending: Emergency Medicine | Admitting: Emergency Medicine

## 2018-01-08 ENCOUNTER — Emergency Department (HOSPITAL_COMMUNITY): Payer: Medicare Other

## 2018-01-08 DIAGNOSIS — I10 Essential (primary) hypertension: Secondary | ICD-10-CM | POA: Diagnosis not present

## 2018-01-08 DIAGNOSIS — E039 Hypothyroidism, unspecified: Secondary | ICD-10-CM | POA: Diagnosis not present

## 2018-01-08 DIAGNOSIS — R42 Dizziness and giddiness: Secondary | ICD-10-CM

## 2018-01-08 DIAGNOSIS — Z96643 Presence of artificial hip joint, bilateral: Secondary | ICD-10-CM | POA: Diagnosis not present

## 2018-01-08 DIAGNOSIS — R202 Paresthesia of skin: Secondary | ICD-10-CM | POA: Insufficient documentation

## 2018-01-08 DIAGNOSIS — R51 Headache: Secondary | ICD-10-CM | POA: Diagnosis not present

## 2018-01-08 DIAGNOSIS — R200 Anesthesia of skin: Secondary | ICD-10-CM | POA: Diagnosis not present

## 2018-01-08 DIAGNOSIS — Z79899 Other long term (current) drug therapy: Secondary | ICD-10-CM | POA: Insufficient documentation

## 2018-01-08 LAB — BASIC METABOLIC PANEL
Anion gap: 8 (ref 5–15)
BUN: 10 mg/dL (ref 8–23)
CALCIUM: 9.2 mg/dL (ref 8.9–10.3)
CO2: 25 mmol/L (ref 22–32)
CREATININE: 0.94 mg/dL (ref 0.61–1.24)
Chloride: 105 mmol/L (ref 98–111)
GLUCOSE: 100 mg/dL — AB (ref 70–99)
Potassium: 3.9 mmol/L (ref 3.5–5.1)
Sodium: 138 mmol/L (ref 135–145)

## 2018-01-08 LAB — CBC
HCT: 45.3 % (ref 39.0–52.0)
Hemoglobin: 14.7 g/dL (ref 13.0–17.0)
MCH: 28.7 pg (ref 26.0–34.0)
MCHC: 32.5 g/dL (ref 30.0–36.0)
MCV: 88.5 fL (ref 80.0–100.0)
Platelets: 284 10*3/uL (ref 150–400)
RBC: 5.12 MIL/uL (ref 4.22–5.81)
RDW: 12.8 % (ref 11.5–15.5)
WBC: 7.5 10*3/uL (ref 4.0–10.5)
nRBC: 0 % (ref 0.0–0.2)

## 2018-01-08 NOTE — ED Notes (Signed)
Returned from MRI 

## 2018-01-08 NOTE — ED Notes (Signed)
Patient transported to MRI 

## 2018-01-08 NOTE — ED Triage Notes (Signed)
Patient here with unilateral numbness on left side, hypertension and slight headache.  Patient is CAOx4, equal hand grips, equal pedal pushes, no facial droop, no slurred speech.  States he has atypical migraines.

## 2018-01-08 NOTE — ED Provider Notes (Signed)
[facility] EMERGENCY DEPARTMENT Provider Note   CSN: [id number] Arrival date & time: 01/08/18  2049     History   Chief Complaint Chief Complaint  Patient presents with  . Hypertension  . Dizziness    HPI Jonathan Powers is a 67 y.o. male.  He is presenting to the emergency department after noticing some dizziness earlier today around 1 PM.  Michela Pitcher it was a little bit lightheaded and a little bit of unsteadiness.  That continued throughout the day and over the last 2 hours he has noted some decreased sensation in his left face and left arm.  He is also started with a mild headache around his left eye.  He checked his blood pressure and it was much more elevated than his usual.  He said he had what he considers an atypical migraine in the past and he gets headaches maybe once or twice a year.  That atypical migraine was associated with some tingling in his face but never this dizziness unsteadiness.  He denies any recent illness.  He has had a foot fusion on the left that has been slow to heal.  He states his vision may be a little blurrier but he has reading glasses and he does not use them very often.  The history is provided by the patient.  Dizziness  Quality:  Lightheadedness and imbalance Severity:  Mild Onset quality:  Gradual Timing:  Intermittent Progression:  Unchanged Chronicity:  New Context: physical activity   Context: not with loss of consciousness   Relieved by:  Nothing Worsened by:  Nothing Ineffective treatments:  None tried Associated symptoms: headaches   Associated symptoms: no chest pain, no shortness of breath and no weakness     Past Medical History:  Diagnosis Date  . ALLERGIC RHINITIS   . CELLULITIS, LEG, RIGHT    Recurrent R hip cellulitis 1/08,3/08   . Diverticulitis   . GERD (gastroesophageal reflux disease)   . Gout   . Hashimoto's thyroiditis   . Hypertension   . Hypogonadism male    low T, a/w ED  . Hypothyroid   .  Left thyroid nodule 2008   on [place], decrease size in 2011 [place]  . Migraines    Atypical and ocular  . OSTEOARTHRITIS   . Seasonal allergies     Patient Active Problem List   Diagnosis Date Noted  . Flexural eczema 07/24/2017  . Routine general medical examination at a health care facility 10/12/2016  . Chronic right-sided low back pain without sciatica 07/21/2016  . Low testosterone 07/21/2016  . Idiopathic chronic gout of multiple sites without tophus 07/21/2016  . Superior mesenteric artery aneurysm ([place]) 07/12/2015  . Left thyroid nodule   . OSA (obstructive sleep apnea) 01/27/2013  . Hypothyroid   . Symptomatic PVCs 09/06/2012  . Dyslipidemia, goal LDL below 130 09/06/2012  . BPH (benign prostatic hyperplasia) 09/06/2012  . OBESITY NOS 05/17/2006  . Essential hypertension 05/17/2006  . ALLERGIC RHINITIS 05/17/2006  . OSTEOARTHRITIS 05/17/2006    Past Surgical History:  Procedure Laterality Date  . CARPAL TUNNEL RELEASE Left 03/30/2017   Procedure: CARPAL TUNNEL RELEASE;  Surgeon: Garald Balding, MD;  Location: [facility];  Service: Orthopedics;  Laterality: Left;  . CHOLECYSTECTOMY  2004  . CTR Bilateral 1982  . CYSTOSCOPY  1974  . HEMORRHOID SURGERY N/A 03/30/2017   Procedure: HEMORRHOIDECTOMY;  Surgeon: Coralie Keens, MD;  Location: [facility];  Service: General;  Laterality:  N/A;  . open reduction L little finger Left 1970  . Repair digital thumb, left Left 1990  . Right shoulder cuff repair Right 09/15/11  . Right shoulder SAD, DCR Right 02/05/11  . TOTAL HIP ARTHROPLASTY Left 07/26/06  . TOTAL HIP ARTHROPLASTY Right 1999        Home Medications    Prior to Admission medications   Medication Sig Start Date End Date Taking? Authorizing Provider  acetaminophen (TYLENOL) 500 MG tablet Take 1,000 mg by mouth 2 (two) times daily.    [provider]  cetirizine (ZYRTEC) 10 MG tablet Take 10 mg by mouth daily.    [provider]  Cholecalciferol (VITAMIN D-3) 5000 UNITS TABS Take by mouth. Takes Monday thru friday    [provider]  ciclopirox (PENLAC) 8 % solution APPLY OVER NAIL & SURROUNDING SKIN, APPLY DAILY OVER PREVIOUS COAT. AFTER 7 DAYS, MAY REMOVE WITH ALCOHOL & CONTINUE CYCLE 08/07/16   [name], MD  diclofenac sodium (VOLTAREN) 1 % GEL 2-4 grams qid as needed for pain as discussed in office 12/03/17   Ofilia Neas, PA-C  Esomeprazole Magnesium (NEXIUM 24HR PO) Take 1 tablet by mouth daily as needed.     [provider]  fluocinonide-emollient (LIDEX-E) 0.05 % cream Apply 1 application topically 2 (two) times daily. 07/24/17   [name], MD  levothyroxine (SYNTHROID, LEVOTHROID) 175 MCG tablet Take 1 tablet (175 mcg total) by mouth daily before breakfast. 07/24/17   [name], MD  methocarbamol (ROBAXIN) 500 MG tablet Take 1 tablet (500 mg total) by mouth 3 (three) times daily as needed for muscle spasms. 06/22/17   Garald Balding, MD  rosuvastatin (CRESTOR) 5 MG tablet Take 1 tablet (5 mg total) by mouth daily. 07/24/17   [name], MD  telmisartan (MICARDIS) 40 MG tablet TAKE 1 TABLET BY MOUTH DAILY. 06/16/17   [name], MD  telmisartan (MICARDIS) 80 MG tablet TAKE HALF TABLET BY MOUTH DAILY  12/03/17   [name], MD  Testosterone 30 MG/ACT SOLN  01/18/16   [provider]  traMADol (ULTRAM) 50 MG tablet Take 1 tablet (50 mg total) by mouth 3 (three) times daily as needed. 12/03/17   Ofilia Neas, PA-C  traZODone (DESYREL) 100 MG tablet TAKE 1/2-1 TABLET BY MOUTH AT BEDTIME AS NEEDED FOR SLEEP. 01/22/17   [name], MD  valACYclovir (VALTREX) 1000 MG tablet  01/12/15   [provider]    Family History Family History  Problem Relation Age of Onset  . Arthritis Father   . Heart disease Father   . Diabetes Father   . Vascular Disease Father        AoBifem  . Arthritis Mother   . Diabetes Mother   . Multiple  sclerosis Mother   . Cancer Paternal Grandmother        "GI"  . Cancer Paternal Grandfather        stomach cancer  . Thyroid cancer Sister   . Uterine cancer Sister     Social History Social History   Tobacco Use  . Smoking status: Never Smoker  . Smokeless tobacco: Never Used  Substance Use Topics  . Alcohol use: Yes    Alcohol/week: 0.0 standard drinks    Comment: rare  . Drug use: No     Allergies   Nsaids and Penicillins   Review of Systems Review of Systems  Constitutional: Negative for fever.  HENT:  Negative for sore throat.   Eyes: Negative for pain.  Respiratory: Negative for shortness of breath.   Cardiovascular: Negative for chest pain.  Gastrointestinal: Negative for abdominal pain.  Genitourinary: Negative for dysuria.  Musculoskeletal: Negative for neck pain.  Skin: Negative for rash.  Neurological: Positive for dizziness, numbness and headaches. Negative for speech difficulty and weakness.     Physical Exam Updated Vital Signs BP (!) 157/89 (BP Location: Right Arm)   Pulse 63   Temp 99.1 F (37.3 C) (Oral)   Wt 129.3 kg   SpO2 97%   BMI 39.75 kg/m   Physical Exam  Constitutional: He is oriented to person, place, and time. He appears well-developed and well-nourished.  HENT:  Head: Normocephalic and atraumatic.  Eyes: Conjunctivae are normal.  Neck: Neck supple.  Cardiovascular: Normal rate and regular rhythm.  No murmur heard. Pulmonary/Chest: Effort normal and breath sounds normal. No respiratory distress.  Abdominal: Soft. There is no tenderness.  Musculoskeletal: He exhibits no edema.  Neurological: He is alert and oriented to person, place, and time. He has normal strength. GCS eye subscore is 4. GCS verbal subscore is 5. GCS motor subscore is 6.  Slight decrease sensation left face and left upper extremity.  Skin: Skin is warm and dry.  Psychiatric: He has a normal mood and affect.  Nursing note and vitals reviewed.    ED  Treatments / Results  Labs (all labs ordered are listed, but only abnormal results are displayed) Labs Reviewed  BASIC METABOLIC PANEL - Abnormal; Notable for the following components:      Result Value   Glucose, Bld 100 (*)    All other components within normal limits  CBC    EKG EKG Interpretation  Date/Time:  Tuesday January 08 2018 21:46:21 EST Ventricular Rate:  56 PR Interval:    QRS Duration: 85 QT Interval:  416 QTC Calculation: 402 R Axis:   49 Text Interpretation:  Sinus rhythm Baseline wander in lead(s) V2 similar to prior 8/14 Confirmed by Aletta Edouard [phone]) on 01/08/2018 9:51:21 PM Also confirmed by Aletta Edouard [phone]), editor Shon Hale [phone])  on 01/09/2018 8:20:12 AM   Radiology Mr Brain Wo Contrast  Result Date: 01/08/2018 CLINICAL DATA:  67 y/o M; unilateral left-sided numbness, hypertension, headache. History of atypical migraine. EXAM: MRI HEAD WITHOUT CONTRAST TECHNIQUE: Multiplanar, multiecho pulse sequences of the brain and surrounding structures were obtained without intravenous contrast. COMPARISON:  07/23/2015 MRI of the head FINDINGS: Brain: No acute infarction, hemorrhage, hydrocephalus, extra-axial collection or mass lesion. No structural or signal abnormality of the brain. Vascular: Normal flow voids. Skull and upper cervical spine: Normal marrow signal. Sinuses/Orbits: Left maxillary sinus mucosal thickening. No abnormal signal of the additional visible paranasal sinuses or the mastoid air cells. Orbits are unremarkable. Other: None. IMPRESSION: No acute intracranial abnormality identified. Stable unremarkable MRI of the brain. Electronically Signed   By: Kristine Garbe M.D.   On: 01/08/2018 23:38    Procedures Procedures (including critical care time)  Medications Ordered in ED Medications - No data to display   Initial Impression / Assessment and Plan / ED Course  I have reviewed the triage vital signs and the nursing  notes.  Pertinent labs & imaging results that were available during my care of the patient were reviewed by me and considered in my medical decision making (see chart for details).  Clinical Course as of Jan 10 1443  Tue Jan 08, 2018  2133 Discussed with Dr. Malen Gauze from  neurology who recommends MRI.  He says if that is negative then the patient can be treated his typical migraine and be discharged.   [MB]    Clinical Course User Index [MB] Hayden Rasmussen, MD     Final Clinical Impressions(s) / ED Diagnoses   Final diagnoses:  Dizziness  Paresthesias    ED Discharge Orders    None       Hayden Rasmussen, MD 01/09/18 1444

## 2018-01-08 NOTE — Discharge Instructions (Addendum)
Your evaluated in the emergency department for dizziness and numbness on the left side of your face and arm.  Your MRI did not show any sign of stroke.  This is possibly related to migraine.  You should follow-up with your doctor and consider referral to neurology.  Return if any concerns.

## 2018-02-05 ENCOUNTER — Encounter: Payer: Self-pay | Admitting: Internal Medicine

## 2018-02-05 ENCOUNTER — Other Ambulatory Visit (INDEPENDENT_AMBULATORY_CARE_PROVIDER_SITE_OTHER): Payer: Medicare Other

## 2018-02-05 ENCOUNTER — Ambulatory Visit (INDEPENDENT_AMBULATORY_CARE_PROVIDER_SITE_OTHER): Payer: Medicare Other | Admitting: Internal Medicine

## 2018-02-05 VITALS — BP 128/76 | HR 87 | Temp 98.7°F | Resp 16 | Ht 71.0 in | Wt 281.2 lb

## 2018-02-05 DIAGNOSIS — I728 Aneurysm of other specified arteries: Secondary | ICD-10-CM | POA: Diagnosis not present

## 2018-02-05 DIAGNOSIS — E039 Hypothyroidism, unspecified: Secondary | ICD-10-CM

## 2018-02-05 DIAGNOSIS — R10812 Left upper quadrant abdominal tenderness: Secondary | ICD-10-CM

## 2018-02-05 DIAGNOSIS — I10 Essential (primary) hypertension: Secondary | ICD-10-CM

## 2018-02-05 DIAGNOSIS — M1A09X0 Idiopathic chronic gout, multiple sites, without tophus (tophi): Secondary | ICD-10-CM

## 2018-02-05 DIAGNOSIS — R1012 Left upper quadrant pain: Secondary | ICD-10-CM

## 2018-02-05 DIAGNOSIS — N400 Enlarged prostate without lower urinary tract symptoms: Secondary | ICD-10-CM

## 2018-02-05 DIAGNOSIS — E785 Hyperlipidemia, unspecified: Secondary | ICD-10-CM

## 2018-02-05 DIAGNOSIS — Z0000 Encounter for general adult medical examination without abnormal findings: Secondary | ICD-10-CM

## 2018-02-05 LAB — URINALYSIS, ROUTINE W REFLEX MICROSCOPIC
BILIRUBIN URINE: NEGATIVE
Hgb urine dipstick: NEGATIVE
KETONES UR: NEGATIVE
Leukocytes, UA: NEGATIVE
Nitrite: NEGATIVE
PH: 5.5 (ref 5.0–8.0)
RBC / HPF: NONE SEEN (ref 0–?)
SPECIFIC GRAVITY, URINE: 1.025 (ref 1.000–1.030)
Total Protein, Urine: NEGATIVE
UROBILINOGEN UA: 0.2 (ref 0.0–1.0)
Urine Glucose: NEGATIVE

## 2018-02-05 LAB — LIPID PANEL
Cholesterol: 149 mg/dL (ref 0–200)
HDL: 48.4 mg/dL (ref >39.00)
LDL CALC: 65 mg/dL (ref 0–99)
NONHDL: 100.91
Total CHOL/HDL Ratio: 3
Triglycerides: 178 mg/dL — ABNORMAL HIGH (ref 0.0–149.0)
VLDL: 35.6 mg/dL (ref 0.0–40.0)

## 2018-02-05 LAB — URIC ACID: URIC ACID, SERUM: 6.7 mg/dL (ref 4.0–7.8)

## 2018-02-05 LAB — PSA: PSA: 0.57 ng/mL (ref 0.10–4.00)

## 2018-02-05 LAB — TSH: TSH: 1.36 u[IU]/mL (ref 0.35–4.50)

## 2018-02-05 NOTE — Progress Notes (Signed)
Subjective:  Patient ID: Jonathan Powers, male    DOB: August 19, 1950  Age: 67 y.o. MRN: [id number]  CC: Hypertension; Annual Exam; Hypothyroidism; and Hyperlipidemia   HPI Jonathan Powers presents for a CPX.  He complains of a 74-month history of abdominal pain that he describes as an achy sensation in his epigastric and left upper quadrant.  He has intermittent episodes of diarrhea but that is nothing new for him as he has a history of IBS-D.  He has gained weight over the last year.  He never sees blood in his stools and he denies constipation or melena.  Past Medical History:  Diagnosis Date  . ALLERGIC RHINITIS   . CELLULITIS, LEG, RIGHT    Recurrent R hip cellulitis 1/08,3/08   . Diverticulitis   . GERD (gastroesophageal reflux disease)   . Gout   . Hashimoto's thyroiditis   . Hypertension   . Hypogonadism male    low T, a/w ED  . Hypothyroid   . Left thyroid nodule 2008   on [place], decrease size in 2011 [place]  . Migraines    Atypical and ocular  . OSTEOARTHRITIS   . Seasonal allergies    Past Surgical History:  Procedure Laterality Date  . CARPAL TUNNEL RELEASE Left 03/30/2017   Procedure: CARPAL TUNNEL RELEASE;  Surgeon: Garald Balding, MD;  Location: [place];  Service: Orthopedics;  Laterality: Left;  . CHOLECYSTECTOMY  2004  . CTR Bilateral 1982  . CYSTOSCOPY  1974  . HEMORRHOID SURGERY N/A 03/30/2017   Procedure: HEMORRHOIDECTOMY;  Surgeon: Coralie Keens, MD;  Location: [place];  Service: General;  Laterality: N/A;  . open reduction L little finger Left 1970  . Repair digital thumb, left Left 1990  . Right shoulder cuff repair Right 09/15/11  . Right shoulder SAD, DCR Right 02/05/11  . TOTAL HIP ARTHROPLASTY Left 07/26/06  . TOTAL HIP ARTHROPLASTY Right 1999    reports that he has never smoked. He has never used smokeless tobacco. He reports current alcohol use. He reports that he does not use drugs. family history includes  Arthritis in his father and mother; Cancer in his paternal grandfather and paternal grandmother; Diabetes in his father and mother; Heart disease in his father; Multiple sclerosis in his mother; Thyroid cancer in his sister; Uterine cancer in his sister; Vascular Disease in his father. Allergies  Allergen Reactions  . Nsaids Shortness Of Breath    Naproxen specifically causes SOB/wheeze tolerates topical diclofenac without problem Tylenol OK  . Penicillins Rash    Outpatient Medications Prior to Visit  Medication Sig Dispense Refill  . acetaminophen (TYLENOL) 500 MG tablet Take 1,000 mg by mouth 2 (two) times daily.    . cetirizine (ZYRTEC) 10 MG tablet Take 10 mg by mouth daily.    . Cholecalciferol (VITAMIN D-3) 5000 UNITS TABS Take by mouth. Takes Monday thru friday    . diclofenac sodium (VOLTAREN) 1 % GEL 2-4 grams qid as needed for pain as discussed in office 10 Tube 12  . Esomeprazole Magnesium (NEXIUM 24HR PO) Take 1 tablet by mouth daily as needed.     . fluocinonide-emollient (LIDEX-E) 0.05 % cream Apply 1 application topically 2 (two) times daily. 60 g 3  . levothyroxine (SYNTHROID, LEVOTHROID) 175 MCG tablet Take 1 tablet (175 mcg total) by mouth daily before breakfast. 90 tablet 1  . rosuvastatin (CRESTOR) 5 MG tablet Take 1 tablet (5 mg total) by mouth daily. 90 tablet 1  .  telmisartan (MICARDIS) 80 MG tablet TAKE HALF TABLET BY MOUTH DAILY  45 tablet 0  . valACYclovir (VALTREX) 1000 MG tablet   11  . ciclopirox (PENLAC) 8 % solution APPLY OVER NAIL & SURROUNDING SKIN, APPLY DAILY OVER PREVIOUS COAT. AFTER 7 DAYS, MAY REMOVE WITH ALCOHOL & CONTINUE CYCLE 6.6 mL 0  . methocarbamol (ROBAXIN) 500 MG tablet Take 1 tablet (500 mg total) by mouth 3 (three) times daily as needed for muscle spasms. 30 tablet 1  . telmisartan (MICARDIS) 40 MG tablet TAKE 1 TABLET BY MOUTH DAILY. 90 tablet 1  . traMADol (ULTRAM) 50 MG tablet Take 1 tablet (50 mg total) by mouth 3 (three) times daily as  needed. 30 tablet 0  . traZODone (DESYREL) 100 MG tablet TAKE 1/2-1 TABLET BY MOUTH AT BEDTIME AS NEEDED FOR SLEEP. 90 tablet 1  . Testosterone 30 MG/ACT SOLN   5   No facility-administered medications prior to visit.     ROS Review of Systems  Constitutional: Positive for unexpected weight change. Negative for appetite change, fatigue and fever.  HENT: Negative.   Eyes: Negative for visual disturbance.  Respiratory: Negative for cough, chest tightness, shortness of breath and wheezing.   Cardiovascular: Negative for chest pain, palpitations and leg swelling.  Gastrointestinal: Positive for abdominal pain and diarrhea. Negative for blood in stool, constipation, nausea and vomiting.  Endocrine: Negative.   Genitourinary: Negative.  Negative for difficulty urinating, dysuria, frequency, hematuria, penile swelling, scrotal swelling, testicular pain and urgency.  Musculoskeletal: Positive for arthralgias. Negative for back pain and neck pain.  Skin: Negative.  Negative for color change and rash.  Neurological: Negative.  Negative for dizziness, weakness, light-headedness and headaches.  Hematological: Negative for adenopathy. Does not bruise/bleed easily.  Psychiatric/Behavioral: Negative.     Objective:  BP 128/76 (BP Location: Left Arm, Patient Position: Sitting, Cuff Size: Large)   Pulse 87   Temp 98.7 F (37.1 C) (Oral)   Resp 16   Ht 5\' 11"  (1.803 m)   Wt 281 lb 4 oz (127.6 kg)   SpO2 95%   BMI 39.23 kg/m   BP Readings from Last 3 Encounters:  02/05/18 128/76  01/08/18 (!) 157/96  07/24/17 130/80    Wt Readings from Last 3 Encounters:  02/05/18 281 lb 4 oz (127.6 kg)  01/08/18 285 lb (129.3 kg)  07/24/17 272 lb 12 oz (123.7 kg)    Physical Exam Vitals signs reviewed.  Constitutional:      General: He is not in acute distress.    Appearance: He is obese. He is not ill-appearing or toxic-appearing.  HENT:     Nose: Nose normal.     Mouth/Throat:     Mouth:  Mucous membranes are moist.     Pharynx: No posterior oropharyngeal erythema.  Eyes:     General: No scleral icterus.    Conjunctiva/sclera: Conjunctivae normal.  Neck:     Musculoskeletal: Normal range of motion and neck supple. No neck rigidity.  Cardiovascular:     Rate and Rhythm: Normal rate and regular rhythm.     Pulses: Normal pulses.     Heart sounds: Normal heart sounds. No gallop.   Pulmonary:     Effort: Pulmonary effort is normal.     Breath sounds: Normal breath sounds. No stridor. No wheezing, rhonchi or rales.  Abdominal:     General: Bowel sounds are normal. There is no distension.     Palpations: Abdomen is rigid. There is no splenomegaly, mass or  pulsatile mass.     Tenderness: There is abdominal tenderness in the epigastric area and left lower quadrant. There is no guarding.     Hernia: No hernia is present. There is no hernia in the right inguinal area or left inguinal area.  Genitourinary:    Pubic Area: No rash.      Penis: Normal and circumcised. No discharge, swelling or lesions.      Scrotum/Testes:        Right: Varicocele present. Mass, tenderness or swelling not present.        Left: Testicular hydrocele present. Mass, tenderness or swelling not present.     Epididymis:     Right: Normal. Not inflamed or enlarged. No mass or tenderness.     Left: Normal. Not inflamed or enlarged. No mass or tenderness.     Prostate: Normal. Not enlarged, not tender and no nodules present.     Rectum: Normal. Guaiac result negative. No mass, tenderness, anal fissure, external hemorrhoid or internal hemorrhoid. Normal anal tone.  Lymphadenopathy:     Lower Body: No right inguinal adenopathy. No left inguinal adenopathy.     Lab Results  Component Value Date   WBC 7.5 01/08/2018   HGB 14.7 01/08/2018   HCT 45.3 01/08/2018   PLT 284 01/08/2018   GLUCOSE 100 (H) 01/08/2018   CHOL 149 02/05/2018   TRIG 178.0 (H) 02/05/2018   HDL 48.40 02/05/2018   LDLCALC 65  02/05/2018   ALT 16 01/03/2017   AST 17 01/03/2017   NA 138 01/08/2018   K 3.9 01/08/2018   CL 105 01/08/2018   CREATININE 0.94 01/08/2018   BUN 10 01/08/2018   CO2 25 01/08/2018   TSH 1.36 02/05/2018   PSA 0.57 02/05/2018   INR 2.4 (H) 07/29/2006   HGBA1C 5.6 09/29/2014    Mr Brain Wo Contrast  Result Date: 01/08/2018 CLINICAL DATA:  67 y/o M; unilateral left-sided numbness, hypertension, headache. History of atypical migraine. EXAM: MRI HEAD WITHOUT CONTRAST TECHNIQUE: Multiplanar, multiecho pulse sequences of the brain and surrounding structures were obtained without intravenous contrast. COMPARISON:  07/23/2015 MRI of the head FINDINGS: Brain: No acute infarction, hemorrhage, hydrocephalus, extra-axial collection or mass lesion. No structural or signal abnormality of the brain. Vascular: Normal flow voids. Skull and upper cervical spine: Normal marrow signal. Sinuses/Orbits: Left maxillary sinus mucosal thickening. No abnormal signal of the additional visible paranasal sinuses or the mastoid air cells. Orbits are unremarkable. Other: None. IMPRESSION: No acute intracranial abnormality identified. Stable unremarkable MRI of the brain. Electronically Signed   By: Kristine Garbe M.D.   On: 01/08/2018 23:38    Assessment & Plan:   Bracken was seen today for hypertension, annual exam, hypothyroidism and hyperlipidemia.  Diagnoses and all orders for this visit:  Essential hypertension- His blood pressure is well controlled.  Electrolytes and renal function are normal. -     Basic metabolic panel; Future -     Urinalysis, Routine w reflex microscopic; Future  Acquired hypothyroidism- His TSH is in the normal range.  He will remain on the current dose of levothyroxine. -     TSH; Future  Idiopathic chronic gout of multiple sites without tophus- His uric acid level is acceptable. -     Uric acid; Future  Benign prostatic hyperplasia without lower urinary tract symptoms-his PSA  is in the normal range which is reassuring that he does not have prostate cancer.  He has no symptoms that need to be treated. -  PSA; Future  Left upper quadrant abdominal tenderness without rebound tenderness- His exam does not reveal an acute abdominal process.  His labs are negative for organic illness.  I have asked him to undergo a CT angio to see if his discomfort is caused by the superior mesenteric artery aneurysm. -     CT Angio Abd/Pel w/ and/or w/o; Future  Left upper quadrant pain  Superior mesenteric artery aneurysm (HCC) -     CT Angio Abd/Pel w/ and/or w/o; Future  Dyslipidemia, goal LDL below 130- He has achieved his LDL goal and is doing well on the statin. -     Lipid panel; Future   I have discontinued Emmerson Taddei. Torrence's Testosterone, ciclopirox, traZODone, methocarbamol, and traMADol. I am also having him maintain his cetirizine, Esomeprazole Magnesium (NEXIUM 24HR PO), acetaminophen, Vitamin D-3, valACYclovir, fluocinonide-emollient, levothyroxine, rosuvastatin, diclofenac sodium, and telmisartan.  No orders of the defined types were placed in this encounter.  See AVS for instructions about healthy living and anticipatory guidance.  Follow-up: Return in about 6 months (around 08/07/2018).  Scarlette Calico, MD

## 2018-02-05 NOTE — Patient Instructions (Signed)

## 2018-02-06 ENCOUNTER — Other Ambulatory Visit: Payer: Self-pay | Admitting: *Deleted

## 2018-02-06 NOTE — Telephone Encounter (Signed)
Patient in office requesting refill on Tramadol  Last Fill: 12/03/17 UDS: 03/08/17 Narc Agreement: 03/08/17  Okay to refill Tramadol?

## 2018-02-06 NOTE — Telephone Encounter (Signed)
Patient declined to update UDS as it cost him $200 to update January 2019.

## 2018-02-06 NOTE — Telephone Encounter (Signed)
Please advise patient to update UDS and narcotic agreement prior to refill.

## 2018-02-07 ENCOUNTER — Encounter: Payer: Self-pay | Admitting: Internal Medicine

## 2018-02-07 LAB — BASIC METABOLIC PANEL
BUN: 18 mg/dL (ref 6–23)
CALCIUM: 9.3 mg/dL (ref 8.4–10.5)
CO2: 28 meq/L (ref 19–32)
CREATININE: 0.94 mg/dL (ref 0.40–1.50)
Chloride: 102 mEq/L (ref 96–112)
GFR: 85.26 mL/min (ref >60.00)
GLUCOSE: 123 mg/dL — AB (ref 70–99)
Potassium: 4 mEq/L (ref 3.5–5.1)
Sodium: 140 mEq/L (ref 135–145)

## 2018-02-07 NOTE — Assessment & Plan Note (Signed)

## 2018-02-14 DIAGNOSIS — M76822 Posterior tibial tendinitis, left leg: Secondary | ICD-10-CM | POA: Diagnosis not present

## 2018-02-14 DIAGNOSIS — M2142 Flat foot [pes planus] (acquired), left foot: Secondary | ICD-10-CM | POA: Diagnosis not present

## 2018-02-14 DIAGNOSIS — M2140 Flat foot [pes planus] (acquired), unspecified foot: Secondary | ICD-10-CM | POA: Diagnosis not present

## 2018-02-14 DIAGNOSIS — M2141 Flat foot [pes planus] (acquired), right foot: Secondary | ICD-10-CM | POA: Diagnosis not present

## 2018-02-15 ENCOUNTER — Ambulatory Visit (INDEPENDENT_AMBULATORY_CARE_PROVIDER_SITE_OTHER)
Admission: RE | Admit: 2018-02-15 | Discharge: 2018-02-15 | Disposition: A | Discharge status: Home / Self Care | Payer: Medicare Other | Source: Ambulatory Visit | Attending: Internal Medicine | Admitting: Internal Medicine

## 2018-02-15 DIAGNOSIS — I728 Aneurysm of other specified arteries: Secondary | ICD-10-CM | POA: Diagnosis not present

## 2018-02-15 DIAGNOSIS — R10812 Left upper quadrant abdominal tenderness: Secondary | ICD-10-CM | POA: Diagnosis not present

## 2018-02-15 DIAGNOSIS — K5730 Diverticulosis of large intestine without perforation or abscess without bleeding: Secondary | ICD-10-CM | POA: Diagnosis not present

## 2018-02-15 MED ORDER — IOPAMIDOL (ISOVUE-370) INJECTION 76%
100.0000 mL | Freq: Once | INTRAVENOUS | Status: AC | PRN
  Administered 2018-02-15: 100 mL via INTRAVENOUS

## 2018-02-17 ENCOUNTER — Encounter: Payer: Self-pay | Admitting: Internal Medicine

## 2018-02-21 ENCOUNTER — Other Ambulatory Visit: Payer: Self-pay | Admitting: Internal Medicine

## 2018-02-21 DIAGNOSIS — E785 Hyperlipidemia, unspecified: Secondary | ICD-10-CM

## 2018-03-05 ENCOUNTER — Other Ambulatory Visit: Payer: Self-pay | Admitting: *Deleted

## 2018-03-05 DIAGNOSIS — Z5181 Encounter for therapeutic drug level monitoring: Secondary | ICD-10-CM

## 2018-03-06 DIAGNOSIS — H01024 Squamous blepharitis left upper eyelid: Secondary | ICD-10-CM | POA: Diagnosis not present

## 2018-03-06 DIAGNOSIS — H01022 Squamous blepharitis right lower eyelid: Secondary | ICD-10-CM | POA: Diagnosis not present

## 2018-03-06 DIAGNOSIS — H00025 Hordeolum internum left lower eyelid: Secondary | ICD-10-CM | POA: Diagnosis not present

## 2018-03-06 DIAGNOSIS — H01025 Squamous blepharitis left lower eyelid: Secondary | ICD-10-CM | POA: Diagnosis not present

## 2018-03-06 DIAGNOSIS — M960 Pseudarthrosis after fusion or arthrodesis: Secondary | ICD-10-CM | POA: Diagnosis not present

## 2018-03-06 DIAGNOSIS — H01021 Squamous blepharitis right upper eyelid: Secondary | ICD-10-CM | POA: Diagnosis not present

## 2018-03-07 ENCOUNTER — Other Ambulatory Visit: Payer: Self-pay | Admitting: *Deleted

## 2018-03-07 LAB — PAIN MGMT, PROFILE 5 W/CONF, U
AMPHETAMINES: NEGATIVE ng/mL (ref <500)
Barbiturates: NEGATIVE ng/mL (ref <300)
Benzodiazepines: NEGATIVE ng/mL (ref <100)
COCAINE METABOLITE: NEGATIVE ng/mL (ref <150)
Creatinine: 166.5 mg/dL
MARIJUANA METABOLITE: NEGATIVE ng/mL (ref <20)
Methadone Metabolite: NEGATIVE ng/mL (ref <100)
OXIDANT: NEGATIVE ug/mL (ref <200)
Opiates: NEGATIVE ng/mL (ref <100)
Oxycodone: NEGATIVE ng/mL (ref <100)
PH: 4.83 (ref 4.5–9.0)

## 2018-03-07 LAB — PAIN MGMT, TRAMADOL W/MEDMATCH, U
DESMETHYLTRAMADOL: NEGATIVE ng/mL (ref <100)
TRAMADOL: NEGATIVE ng/mL (ref <100)

## 2018-03-07 MED ORDER — TRAMADOL HCL 50 MG PO TABS: 50.0000 mg | ORAL_TABLET | Freq: Three times a day (TID) | ORAL | 0 refills | Status: DC | PRN

## 2018-03-07 NOTE — Telephone Encounter (Signed)
-----   Message from Bo Merino, MD sent at 03/07/2018 12:39 PM EST ----- UDS is negative.  He has been out of tramadol.  Please fill the prescription for tramadol.

## 2018-03-07 NOTE — Progress Notes (Signed)
UDS is negative.  He has been out of tramadol.  Please fill the prescription for tramadol.

## 2018-03-07 NOTE — Telephone Encounter (Signed)
Last Fill: 12/03/17 UDS: 03/06/18 Narc Agreement: 03/06/18  Okay to refill Tramadol?

## 2018-03-07 NOTE — Telephone Encounter (Signed)
Dr. Estanislado Pandy is ok with refilling tramadol.

## 2018-03-09 ENCOUNTER — Encounter: Payer: Self-pay | Admitting: Internal Medicine

## 2018-03-11 ENCOUNTER — Other Ambulatory Visit: Payer: Self-pay | Admitting: Internal Medicine

## 2018-03-11 DIAGNOSIS — M810 Age-related osteoporosis without current pathological fracture: Secondary | ICD-10-CM

## 2018-03-11 DIAGNOSIS — M8000XA Age-related osteoporosis with current pathological fracture, unspecified site, initial encounter for fracture: Secondary | ICD-10-CM | POA: Insufficient documentation

## 2018-03-11 HISTORY — DX: Age-related osteoporosis with current pathological fracture, unspecified site, initial encounter for fracture: M80.00XA

## 2018-03-11 HISTORY — DX: Age-related osteoporosis without current pathological fracture: M81.0

## 2018-03-11 MED ORDER — TERIPARATIDE (RECOMBINANT) 600 MCG/2.4ML ~~LOC~~ SOLN: 20.0000 ug | Freq: Every day | SUBCUTANEOUS | 5 refills | Status: DC

## 2018-03-13 ENCOUNTER — Other Ambulatory Visit: Payer: Self-pay | Admitting: Internal Medicine

## 2018-03-14 ENCOUNTER — Telehealth: Payer: Self-pay | Admitting: Internal Medicine

## 2018-03-14 DIAGNOSIS — I10 Essential (primary) hypertension: Secondary | ICD-10-CM

## 2018-03-14 NOTE — Telephone Encounter (Signed)
Copied from [place] [id number]. Topic: Quick Communication - See Telephone Encounter >> Mar 14, 2018  1:17 PM Vernona Rieger wrote: CRM for notification. See Telephone encounter for: 03/14/18.  Patient would like to know could his script for telmisartan (MICARDIS) 80 MG tablet be written for 40 mg so he does not have to split it in half since there is no actual line on the pill. He said he asked [facility] about it and they have a 40 mg in this medication. He has not received the one that was sent yesterday for 80mg  because [facility] advised him it was denied. I advised him that it was sent in yesterday and to please check back with pharmacy. Please Advise    [facility].

## 2018-03-15 ENCOUNTER — Other Ambulatory Visit: Payer: Self-pay | Admitting: Internal Medicine

## 2018-03-15 MED ORDER — TELMISARTAN 40 MG PO TABS: 40.0000 mg | ORAL_TABLET | Freq: Every day | ORAL | 1 refills | Status: DC

## 2018-03-15 NOTE — Telephone Encounter (Signed)
Erx for the telmisartan 40 mg sent in per pt request and PCP approval.

## 2018-03-18 ENCOUNTER — Telehealth: Payer: Self-pay

## 2018-03-18 NOTE — Telephone Encounter (Signed)
Sending fax back to let [facility] know that pt has not been reevaluated for the CPAP. Original dx and OV is from 2017 by Dr. Lake Bells.

## 2018-03-18 NOTE — Telephone Encounter (Signed)
PAP devices chart note documentation request.   Message sent to pt for assistance.

## 2018-03-20 DIAGNOSIS — H43393 Other vitreous opacities, bilateral: Secondary | ICD-10-CM | POA: Diagnosis not present

## 2018-03-20 DIAGNOSIS — H0102A Squamous blepharitis right eye, upper and lower eyelids: Secondary | ICD-10-CM | POA: Diagnosis not present

## 2018-03-20 DIAGNOSIS — H35033 Hypertensive retinopathy, bilateral: Secondary | ICD-10-CM | POA: Diagnosis not present

## 2018-03-20 DIAGNOSIS — H40013 Open angle with borderline findings, low risk, bilateral: Secondary | ICD-10-CM | POA: Diagnosis not present

## 2018-03-20 DIAGNOSIS — H259 Unspecified age-related cataract: Secondary | ICD-10-CM | POA: Diagnosis not present

## 2018-03-20 DIAGNOSIS — H00025 Hordeolum internum left lower eyelid: Secondary | ICD-10-CM | POA: Diagnosis not present

## 2018-03-20 DIAGNOSIS — H0102B Squamous blepharitis left eye, upper and lower eyelids: Secondary | ICD-10-CM | POA: Diagnosis not present

## 2018-03-21 DIAGNOSIS — L300 Nummular dermatitis: Secondary | ICD-10-CM | POA: Diagnosis not present

## 2018-03-21 DIAGNOSIS — S80811A Abrasion, right lower leg, initial encounter: Secondary | ICD-10-CM | POA: Diagnosis not present

## 2018-03-21 DIAGNOSIS — D1801 Hemangioma of skin and subcutaneous tissue: Secondary | ICD-10-CM | POA: Diagnosis not present

## 2018-03-21 DIAGNOSIS — D225 Melanocytic nevi of trunk: Secondary | ICD-10-CM | POA: Diagnosis not present

## 2018-03-21 DIAGNOSIS — D485 Neoplasm of uncertain behavior of skin: Secondary | ICD-10-CM | POA: Diagnosis not present

## 2018-04-02 DIAGNOSIS — R52 Pain, unspecified: Secondary | ICD-10-CM | POA: Diagnosis not present

## 2018-04-02 DIAGNOSIS — D3614 Benign neoplasm of peripheral nerves and autonomic nervous system of thorax: Secondary | ICD-10-CM | POA: Diagnosis not present

## 2018-04-02 DIAGNOSIS — D485 Neoplasm of uncertain behavior of skin: Secondary | ICD-10-CM | POA: Diagnosis not present

## 2018-04-02 DIAGNOSIS — D225 Melanocytic nevi of trunk: Secondary | ICD-10-CM | POA: Diagnosis not present

## 2018-04-02 DIAGNOSIS — D3617 Benign neoplasm of peripheral nerves and autonomic nervous system of trunk, unspecified: Secondary | ICD-10-CM | POA: Diagnosis not present

## 2018-04-04 ENCOUNTER — Other Ambulatory Visit: Payer: Self-pay | Admitting: *Deleted

## 2018-04-04 DIAGNOSIS — Z79899 Other long term (current) drug therapy: Secondary | ICD-10-CM | POA: Diagnosis not present

## 2018-04-04 DIAGNOSIS — R5383 Other fatigue: Secondary | ICD-10-CM

## 2018-04-04 DIAGNOSIS — Z8639 Personal history of other endocrine, nutritional and metabolic disease: Secondary | ICD-10-CM

## 2018-04-04 DIAGNOSIS — M810 Age-related osteoporosis without current pathological fracture: Secondary | ICD-10-CM | POA: Diagnosis not present

## 2018-04-05 ENCOUNTER — Ambulatory Visit (INDEPENDENT_AMBULATORY_CARE_PROVIDER_SITE_OTHER): Payer: Medicare Other | Admitting: Family

## 2018-04-05 ENCOUNTER — Encounter: Payer: Self-pay | Admitting: Family

## 2018-04-05 ENCOUNTER — Other Ambulatory Visit (INDEPENDENT_AMBULATORY_CARE_PROVIDER_SITE_OTHER): Payer: Medicare Other

## 2018-04-05 VITALS — BP 128/78 | HR 65 | Temp 99.0°F | Ht 71.0 in | Wt 279.1 lb

## 2018-04-05 DIAGNOSIS — R109 Unspecified abdominal pain: Secondary | ICD-10-CM | POA: Diagnosis not present

## 2018-04-05 LAB — COMPLETE METABOLIC PANEL WITH GFR
AG RATIO: 1.9 (calc) (ref 1.0–2.5)
ALT: 16 U/L (ref 9–46)
AST: 19 U/L (ref 10–35)
Albumin: 4.5 g/dL (ref 3.6–5.1)
Alkaline phosphatase (APISO): 64 U/L (ref 35–144)
BILIRUBIN TOTAL: 0.8 mg/dL (ref 0.2–1.2)
BUN: 14 mg/dL (ref 7–25)
CALCIUM: 9.1 mg/dL (ref 8.6–10.3)
CHLORIDE: 104 mmol/L (ref 98–110)
CO2: 24 mmol/L (ref 20–32)
Creat: 1.01 mg/dL (ref 0.70–1.25)
GFR, Est African American: 89 mL/min/{1.73_m2} (ref >=60)
GFR, Est Non African American: 77 mL/min/{1.73_m2} (ref >=60)
Globulin: 2.4 g/dL (calc) (ref 1.9–3.7)
Glucose, Bld: 97 mg/dL (ref 65–99)
Potassium: 4.3 mmol/L (ref 3.5–5.3)
Sodium: 138 mmol/L (ref 135–146)
Total Protein: 6.9 g/dL (ref 6.1–8.1)

## 2018-04-05 LAB — CBC WITH DIFFERENTIAL/PLATELET
Basophils Absolute: 0 10*3/uL (ref 0.0–0.1)
Basophils Relative: 0.2 % (ref 0.0–3.0)
Eosinophils Absolute: 0.2 10*3/uL (ref 0.0–0.7)
Eosinophils Relative: 3.7 % (ref 0.0–5.0)
HEMATOCRIT: 48.7 % (ref 39.0–52.0)
Hemoglobin: 16.9 g/dL (ref 13.0–17.0)
Lymphocytes Relative: 24.8 % (ref 12.0–46.0)
Lymphs Abs: 1.5 10*3/uL (ref 0.7–4.0)
MCHC: 34.8 g/dL (ref 30.0–36.0)
MCV: 86.8 fl (ref 78.0–100.0)
Monocytes Absolute: 0.9 10*3/uL (ref 0.1–1.0)
Monocytes Relative: 13.9 % — ABNORMAL HIGH (ref 3.0–12.0)
Neutro Abs: 3.5 10*3/uL (ref 1.4–7.7)
Neutrophils Relative %: 57.4 % (ref 43.0–77.0)
PLATELETS: 313 10*3/uL (ref 150.0–400.0)
RBC: 5.61 Mil/uL (ref 4.22–5.81)
RDW: 13.8 % (ref 11.5–15.5)
WBC: 6.2 10*3/uL (ref 4.0–10.5)

## 2018-04-05 LAB — VITAMIN D 25 HYDROXY (VIT D DEFICIENCY, FRACTURES): Vit D, 25-Hydroxy: 54 ng/mL (ref 30–100)

## 2018-04-05 LAB — AMYLASE: Amylase: 26 U/L — ABNORMAL LOW (ref 27–131)

## 2018-04-05 LAB — LIPASE: Lipase: 12 U/L (ref 11.0–59.0)

## 2018-04-05 MED ORDER — ONDANSETRON 8 MG PO TBDP: 8.0000 mg | ORAL_TABLET | Freq: Three times a day (TID) | ORAL | 0 refills | Status: DC | PRN

## 2018-04-05 MED ORDER — HYDROCORTISONE ACE-PRAMOXINE 1-1 % RE FOAM: 1.0000 | Freq: Two times a day (BID) | RECTAL | 0 refills | Status: DC

## 2018-04-05 NOTE — Progress Notes (Signed)
Labs are normal.  Vitamin D is 54.

## 2018-04-05 NOTE — Progress Notes (Signed)
Jonathan Powers is a 68 y.o. male with the following history as recorded in [employer]:  Patient Active Problem List   Diagnosis Date Noted  . Age-related osteoporosis with current pathological fracture 03/11/2018  . Flexural eczema 07/24/2017  . Routine general medical examination at a health care facility 10/12/2016  . Chronic right-sided low back pain without sciatica 07/21/2016  . Low testosterone 07/21/2016  . Idiopathic chronic gout of multiple sites without tophus 07/21/2016  . Superior mesenteric artery aneurysm ([place]) 07/12/2015  . Left thyroid nodule   . OSA (obstructive sleep apnea) 01/27/2013  . Hypothyroid   . Symptomatic PVCs 09/06/2012  . Dyslipidemia, goal LDL below 130 09/06/2012  . BPH (benign prostatic hyperplasia) 09/06/2012  . OBESITY NOS 05/17/2006  . Essential hypertension 05/17/2006  . ALLERGIC RHINITIS 05/17/2006  . OSTEOARTHRITIS 05/17/2006    Current Outpatient Medications  Medication Sig Dispense Refill  . acetaminophen (TYLENOL) 500 MG tablet Take 1,000 mg by mouth 2 (two) times daily.    . cetirizine (ZYRTEC) 10 MG tablet Take 10 mg by mouth daily.    . Cholecalciferol (VITAMIN D-3) 5000 UNITS TABS Take by mouth. Takes Monday thru friday    . diclofenac sodium (VOLTAREN) 1 % GEL 2-4 grams qid as needed for pain as discussed in office 10 Tube 12  . Esomeprazole Magnesium (NEXIUM 24HR PO) Take 1 tablet by mouth daily as needed.     . fluocinonide-emollient (LIDEX-E) 0.05 % cream Apply 1 application topically 2 (two) times daily. 60 g 3  . levothyroxine (SYNTHROID, LEVOTHROID) 175 MCG tablet Take 1 tablet (175 mcg total) by mouth daily before breakfast. 90 tablet 1  . rosuvastatin (CRESTOR) 5 MG tablet TAKE ONE TABLET BY MOUTH ONE TIME DAILY  90 tablet 1  . telmisartan (MICARDIS) 40 MG tablet Take 1 tablet (40 mg total) by mouth daily. 90 tablet 1  . Teriparatide, Recombinant, 600 MCG/2.4ML SOLN Inject 0.08 mLs (20 mcg total) into the skin daily. 2.24 mL 5   . traMADol (ULTRAM) 50 MG tablet Take 1 tablet (50 mg total) by mouth 3 (three) times daily as needed. 30 tablet 0  . valACYclovir (VALTREX) 1000 MG tablet   11  . hydrocortisone-pramoxine (PROCTOFOAM HC) rectal foam Place 1 applicator rectally 2 (two) times daily. 10 g 0  . ondansetron (ZOFRAN-ODT) 8 MG disintegrating tablet Take 1 tablet (8 mg total) by mouth every 8 (eight) hours as needed for nausea or vomiting. 20 tablet 0   No current facility-administered medications for this visit.     Allergies: Nsaids and Penicillins  Past Medical History:  Diagnosis Date  . ALLERGIC RHINITIS   . CELLULITIS, LEG, RIGHT    Recurrent R hip cellulitis 1/08,3/08   . Diverticulitis   . GERD (gastroesophageal reflux disease)   . Gout   . Hashimoto's thyroiditis   . Hypertension   . Hypogonadism male    low T, a/w ED  . Hypothyroid   . Left thyroid nodule 2008   on [place], decrease size in 2011 [place]  . Migraines    Atypical and ocular  . OSTEOARTHRITIS   . Seasonal allergies     Past Surgical History:  Procedure Laterality Date  . CARPAL TUNNEL RELEASE Left 03/30/2017   Procedure: CARPAL TUNNEL RELEASE;  Surgeon: Garald Balding, MD;  Location: [place];  Service: Orthopedics;  Laterality: Left;  . CHOLECYSTECTOMY  2004  . CTR Bilateral 1982  . CYSTOSCOPY  1974  . HEMORRHOID SURGERY N/A 03/30/2017  Procedure: HEMORRHOIDECTOMY;  Surgeon: Coralie Keens, MD;  Location: [place];  Service: General;  Laterality: N/A;  . open reduction L little finger Left 1970  . Repair digital thumb, left Left 1990  . Right shoulder cuff repair Right 09/15/11  . Right shoulder SAD, DCR Right 02/05/11  . TOTAL HIP ARTHROPLASTY Left 07/26/06  . TOTAL HIP ARTHROPLASTY Right 1999    Family History  Problem Relation Age of Onset  . Arthritis Father   . Heart disease Father   . Diabetes Father   . Vascular Disease Father        AoBifem  . Arthritis Mother   . Diabetes Mother    . Multiple sclerosis Mother   . Cancer Paternal Grandmother        "GI"  . Cancer Paternal Grandfather        stomach cancer  . Thyroid cancer Sister   . Uterine cancer Sister     Social History   Tobacco Use  . Smoking status: Never Smoker  . Smokeless tobacco: Never Used  Substance Use Topics  . Alcohol use: Yes    Alcohol/week: 0.0 standard drinks    Comment: rare    Subjective:  Patient presents with 2 day history of diarrhea, nausea; does have history of IBS-D; no fever; no vomiting; no household sick contacts/ no family members with similar symptoms; has been drinking Gatorade/ Ginger Ale; CMP was done yesterday- was normal; no recent travel; had normal Angio CT of abdomen in December 2019;    Objective:  Vitals:   04/05/18 1334  BP: 128/78  Pulse: 65  Temp: 99 F (37.2 C)  TempSrc: Oral  SpO2: 97%  Weight: 279 lb 1.3 oz (126.6 kg)  Height: 5\' 11"  (1.803 m)    General: Well developed, well nourished, in no acute distress  Skin : Warm and dry.  Head: Normocephalic and atraumatic  Eyes: Sclera and conjunctiva clear; pupils round and reactive to light; extraocular movements intact  Ears: External normal; canals clear; tympanic membranes normal  Oropharynx: Pink, supple. No suspicious lesions  Neck: Supple without thyromegaly, adenopathy  Lungs: Respirations unlabored; Abdomen: Soft; nontender; nondistended; normoactive bowel sounds; no masses or hepatosplenomegaly  Musculoskeletal: No deformities; no active joint inflammation  Extremities: No edema, cyanosis, clubbing  Vessels: Symmetric bilaterally  Neurologic: Alert and oriented; speech intact; face symmetrical; moves all extremities well; CNII-XII intact without focal deficit   Assessment:  1. Abdominal pain, unspecified abdominal location     Plan:  Suspect viral; check CBC, amylase, lipase; Rx for Zofran, continue BRAT diet; follow-up to be determined.  No follow-ups on file.  Orders Placed This  Encounter  Procedures  . CBC w/Diff    Standing Status:   Future    Number of Occurrences:   1    Standing Expiration Date:   04/05/2019  . Amylase    Standing Status:   Future    Number of Occurrences:   1    Standing Expiration Date:   04/05/2019  . Lipase    Standing Status:   Future    Number of Occurrences:   1    Standing Expiration Date:   04/05/2019    Requested Prescriptions   Signed Prescriptions Disp Refills  . hydrocortisone-pramoxine (PROCTOFOAM HC) rectal foam 10 g 0    Sig: Place 1 applicator rectally 2 (two) times daily.  . ondansetron (ZOFRAN-ODT) 8 MG disintegrating tablet 20 tablet 0    Sig: Take 1 tablet (8 mg  total) by mouth every 8 (eight) hours as needed for nausea or vomiting.

## 2018-04-16 ENCOUNTER — Encounter: Payer: Self-pay | Admitting: Internal Medicine

## 2018-04-18 ENCOUNTER — Other Ambulatory Visit: Payer: Self-pay | Admitting: *Deleted

## 2018-04-18 MED ORDER — TRAMADOL HCL 50 MG PO TABS: 50.0000 mg | ORAL_TABLET | Freq: Three times a day (TID) | ORAL | 0 refills | Status: DC | PRN

## 2018-04-18 NOTE — Telephone Encounter (Signed)
Last Fill: 03/07/18 UDS: 03/06/18 Narc Agreement: 03/06/18  Okay to refill Tramadol?

## 2018-05-13 LAB — PSA: PSA: 0.66

## 2018-05-16 ENCOUNTER — Other Ambulatory Visit (INDEPENDENT_AMBULATORY_CARE_PROVIDER_SITE_OTHER): Payer: Self-pay | Admitting: Orthopaedic Surgery

## 2018-05-16 ENCOUNTER — Other Ambulatory Visit: Payer: Self-pay | Admitting: *Deleted

## 2018-05-16 DIAGNOSIS — G8929 Other chronic pain: Secondary | ICD-10-CM

## 2018-05-16 DIAGNOSIS — M25562 Pain in left knee: Principal | ICD-10-CM

## 2018-05-16 DIAGNOSIS — M25561 Pain in right knee: Principal | ICD-10-CM

## 2018-05-16 NOTE — Progress Notes (Signed)
xr

## 2018-05-17 ENCOUNTER — Ambulatory Visit (INDEPENDENT_AMBULATORY_CARE_PROVIDER_SITE_OTHER): Payer: Medicare Other

## 2018-05-17 ENCOUNTER — Telehealth: Payer: Self-pay | Admitting: Rheumatology

## 2018-05-17 ENCOUNTER — Other Ambulatory Visit: Payer: Self-pay | Admitting: Internal Medicine

## 2018-05-17 ENCOUNTER — Other Ambulatory Visit: Payer: Self-pay

## 2018-05-17 DIAGNOSIS — M25562 Pain in left knee: Principal | ICD-10-CM

## 2018-05-17 DIAGNOSIS — G8929 Other chronic pain: Secondary | ICD-10-CM

## 2018-05-17 DIAGNOSIS — M25561 Pain in right knee: Principal | ICD-10-CM

## 2018-05-17 MED ORDER — TRAMADOL HCL 50 MG PO TABS: 50.0000 mg | ORAL_TABLET | Freq: Three times a day (TID) | ORAL | 0 refills | Status: DC | PRN

## 2018-05-17 NOTE — Telephone Encounter (Signed)
Last Fill: 04/18/18 UDS: 03/06/18 Narc Agreement: 03/06/18  Okay to refill Tramadol?

## 2018-05-17 NOTE — Telephone Encounter (Signed)
Patient request a refill on Tramadol sent to [facility]. Patient will be out 28th.

## 2018-05-18 ENCOUNTER — Other Ambulatory Visit: Payer: Self-pay | Admitting: Internal Medicine

## 2018-05-18 DIAGNOSIS — I10 Essential (primary) hypertension: Secondary | ICD-10-CM

## 2018-05-18 MED ORDER — TELMISARTAN 40 MG PO TABS: 40.0000 mg | ORAL_TABLET | Freq: Every day | ORAL | 1 refills | Status: DC

## 2018-05-20 DIAGNOSIS — N5201 Erectile dysfunction due to arterial insufficiency: Secondary | ICD-10-CM | POA: Diagnosis not present

## 2018-05-20 DIAGNOSIS — R948 Abnormal results of function studies of other organs and systems: Secondary | ICD-10-CM | POA: Diagnosis not present

## 2018-06-03 ENCOUNTER — Other Ambulatory Visit: Payer: Self-pay | Admitting: Internal Medicine

## 2018-06-03 DIAGNOSIS — E039 Hypothyroidism, unspecified: Secondary | ICD-10-CM

## 2018-06-18 ENCOUNTER — Other Ambulatory Visit: Payer: Self-pay

## 2018-06-18 MED ORDER — TRAMADOL HCL 50 MG PO TABS: 50.0000 mg | ORAL_TABLET | Freq: Three times a day (TID) | ORAL | 0 refills | Status: DC | PRN

## 2018-06-18 NOTE — Telephone Encounter (Signed)
Patient requested refill of tramadol to be sent to [facility].   UDS:03/05/2018 Narc Agreement: 03/05/2018  Last fill: 05/17/2018  Okay to refill tramadol?

## 2018-06-26 DIAGNOSIS — M76822 Posterior tibial tendinitis, left leg: Secondary | ICD-10-CM | POA: Diagnosis not present

## 2018-06-26 DIAGNOSIS — M85872 Other specified disorders of bone density and structure, left ankle and foot: Secondary | ICD-10-CM | POA: Diagnosis not present

## 2018-06-26 DIAGNOSIS — Z6841 Body Mass Index (BMI) 40.0 and over, adult: Secondary | ICD-10-CM | POA: Diagnosis not present

## 2018-06-26 DIAGNOSIS — M960 Pseudarthrosis after fusion or arthrodesis: Secondary | ICD-10-CM | POA: Diagnosis not present

## 2018-06-26 DIAGNOSIS — M79672 Pain in left foot: Secondary | ICD-10-CM | POA: Diagnosis not present

## 2018-07-09 ENCOUNTER — Telehealth: Payer: Self-pay | Admitting: Pharmacist

## 2018-07-09 ENCOUNTER — Other Ambulatory Visit: Payer: Self-pay | Admitting: Physician Assistant

## 2018-07-09 DIAGNOSIS — Z5181 Encounter for therapeutic drug level monitoring: Secondary | ICD-10-CM

## 2018-07-09 DIAGNOSIS — Z79890 Hormone replacement therapy (postmenopausal): Secondary | ICD-10-CM

## 2018-07-09 DIAGNOSIS — Z79899 Other long term (current) drug therapy: Secondary | ICD-10-CM | POA: Diagnosis not present

## 2018-07-09 DIAGNOSIS — Z8639 Personal history of other endocrine, nutritional and metabolic disease: Secondary | ICD-10-CM

## 2018-07-09 DIAGNOSIS — E559 Vitamin D deficiency, unspecified: Secondary | ICD-10-CM | POA: Diagnosis not present

## 2018-07-09 DIAGNOSIS — R5383 Other fatigue: Secondary | ICD-10-CM

## 2018-07-09 DIAGNOSIS — M1990 Unspecified osteoarthritis, unspecified site: Secondary | ICD-10-CM | POA: Diagnosis not present

## 2018-07-09 DIAGNOSIS — R7989 Other specified abnormal findings of blood chemistry: Secondary | ICD-10-CM

## 2018-07-09 NOTE — Telephone Encounter (Signed)
Verbal order for labs.

## 2018-07-09 NOTE — Telephone Encounter (Signed)
Patient requested refill of tramadol to be sent to [facility].   UDS:03/05/2018 Narc Agreement: 03/05/2018  Last fill: 06/18/2018  Okay to refill tramadol?

## 2018-07-10 ENCOUNTER — Encounter: Payer: Self-pay | Admitting: Rheumatology

## 2018-07-10 ENCOUNTER — Ambulatory Visit (INDEPENDENT_AMBULATORY_CARE_PROVIDER_SITE_OTHER): Payer: Medicare Other | Admitting: Rheumatology

## 2018-07-10 ENCOUNTER — Other Ambulatory Visit: Payer: Self-pay

## 2018-07-10 VITALS — BP 122/74 | HR 68 | Resp 15 | Ht 71.0 in | Wt 279.0 lb

## 2018-07-10 DIAGNOSIS — Z9889 Other specified postprocedural states: Secondary | ICD-10-CM

## 2018-07-10 DIAGNOSIS — Z8639 Personal history of other endocrine, nutritional and metabolic disease: Secondary | ICD-10-CM

## 2018-07-10 DIAGNOSIS — M1990 Unspecified osteoarthritis, unspecified site: Secondary | ICD-10-CM

## 2018-07-10 DIAGNOSIS — M5030 Other cervical disc degeneration, unspecified cervical region: Secondary | ICD-10-CM

## 2018-07-10 DIAGNOSIS — Z8719 Personal history of other diseases of the digestive system: Secondary | ICD-10-CM

## 2018-07-10 DIAGNOSIS — Z96643 Presence of artificial hip joint, bilateral: Secondary | ICD-10-CM

## 2018-07-10 DIAGNOSIS — M2141 Flat foot [pes planus] (acquired), right foot: Secondary | ICD-10-CM

## 2018-07-10 DIAGNOSIS — M2142 Flat foot [pes planus] (acquired), left foot: Secondary | ICD-10-CM

## 2018-07-10 DIAGNOSIS — M19041 Primary osteoarthritis, right hand: Secondary | ICD-10-CM

## 2018-07-10 DIAGNOSIS — M1A09X0 Idiopathic chronic gout, multiple sites, without tophus (tophi): Secondary | ICD-10-CM

## 2018-07-10 DIAGNOSIS — Z5181 Encounter for therapeutic drug level monitoring: Secondary | ICD-10-CM

## 2018-07-10 DIAGNOSIS — M5136 Other intervertebral disc degeneration, lumbar region: Secondary | ICD-10-CM

## 2018-07-10 DIAGNOSIS — Z8669 Personal history of other diseases of the nervous system and sense organs: Secondary | ICD-10-CM

## 2018-07-10 DIAGNOSIS — M19042 Primary osteoarthritis, left hand: Secondary | ICD-10-CM

## 2018-07-10 DIAGNOSIS — Z8679 Personal history of other diseases of the circulatory system: Secondary | ICD-10-CM

## 2018-07-10 MED ORDER — ALLOPURINOL 100 MG PO TABS: ORAL_TABLET | ORAL | 0 refills | Status: DC

## 2018-07-10 NOTE — Progress Notes (Signed)
Office Visit Note  Patient: Jonathan Powers             Date of Birth: 1950/09/07           MRN: [id number]             PCP: [name], MD Referring: [name], MD Visit Date: 07/10/2018 Occupation: @GUAROCC @  Subjective:  Pain and swelling in left wrist.   History of Present Illness: Jonathan Powers is a 68 y.o. male with history of gout and inflammatory arthritis.  His symptoms of gout is started several years ago.  He was taking Uloric for many years along with colchicine as needed.  He states he came off Uloric after the cardiovascular warning on the medication.  Last year he started having pain and swelling in his left wrist joint.  At that time an ultrasound was done in the office which showed synovitis.  He was placed on Plaquenil but he could not tolerate it due to dizziness and headache.  He did not notice any improvement in his symptoms as well.  He underwent left midfoot fusion in May 2019.  He gradually has recovered from that.  Recently has been experiencing increased pain and discomfort in his left wrist joint with swelling.  None of the other joints are swollen.  He states he did have some underlying arthritis in his shoulder joint which causes discomfort.  He also has osteoarthritis in his knee joints which causes chronic pain.  He had bilateral total hip replacement which continues to disclose discomfort.  He has underlying disc disease of cervical spine and lumbar spine which causes chronic pain.  Activities of Daily Living:  Patient reports morning stiffness for 2 hours.   Patient Reports nocturnal pain.  Difficulty dressing/grooming: Reports Difficulty climbing stairs: Reports Difficulty getting out of chair: Reports Difficulty using hands for taps, buttons, cutlery, and/or writing: Reports  Review of Systems  Constitutional: Positive for fatigue. Negative for night sweats.  HENT: Negative for mouth sores, mouth dryness and nose dryness.   Eyes: Negative  for redness and dryness.  Respiratory: Negative for shortness of breath and difficulty breathing.   Cardiovascular: Negative for chest pain, palpitations, hypertension, irregular heartbeat and swelling in legs/feet.  Gastrointestinal: Negative for constipation and diarrhea.  Endocrine: Negative for increased urination.  Musculoskeletal: Positive for arthralgias, joint pain, joint swelling and morning stiffness. Negative for myalgias, muscle weakness, muscle tenderness and myalgias.  Skin: Negative for color change, rash, hair loss, nodules/bumps, skin tightness, ulcers and sensitivity to sunlight.  Allergic/Immunologic: Negative for susceptible to infections.  Neurological: Negative for dizziness, fainting, memory loss, night sweats and weakness ( ).  Hematological: Negative for swollen glands.  Psychiatric/Behavioral: Negative for depressed mood and sleep disturbance. The patient is not nervous/anxious.     PMFS History:  Patient Active Problem List   Diagnosis Date Noted  . Age-related osteoporosis with current pathological fracture 03/11/2018  . Flexural eczema 07/24/2017  . Routine general medical examination at a health care facility 10/12/2016  . Chronic right-sided low back pain without sciatica 07/21/2016  . Low testosterone 07/21/2016  . Idiopathic chronic gout of multiple sites without tophus 07/21/2016  . Superior mesenteric artery aneurysm ([place]) 07/12/2015  . Left thyroid nodule   . OSA (obstructive sleep apnea) 01/27/2013  . Hypothyroid   . Symptomatic PVCs 09/06/2012  . Dyslipidemia, goal LDL below 130 09/06/2012  . BPH (benign prostatic hyperplasia) 09/06/2012  . OBESITY NOS 05/17/2006  . Essential hypertension  05/17/2006  . ALLERGIC RHINITIS 05/17/2006  . OSTEOARTHRITIS 05/17/2006    Past Medical History:  Diagnosis Date  . ALLERGIC RHINITIS   . CELLULITIS, LEG, RIGHT    Recurrent R hip cellulitis 1/08,3/08   . Diverticulitis   . GERD (gastroesophageal reflux  disease)   . Gout   . Hashimoto's thyroiditis   . Hypertension   . Hypogonadism male    low T, a/w ED  . Hypothyroid   . Left thyroid nodule 2008   on [place], decrease size in 2011 [place]  . Migraines    Atypical and ocular  . OSTEOARTHRITIS   . Seasonal allergies     Family History  Problem Relation Age of Onset  . Arthritis Father   . Heart disease Father   . Diabetes Father   . Vascular Disease Father        AoBifem  . Arthritis Mother   . Diabetes Mother   . Multiple sclerosis Mother   . Cancer Paternal Grandmother        "GI"  . Cancer Paternal Grandfather        stomach cancer  . Thyroid cancer Sister   . Uterine cancer Sister    Past Surgical History:  Procedure Laterality Date  . CARPAL TUNNEL RELEASE Left 03/30/2017   Procedure: CARPAL TUNNEL RELEASE;  Surgeon: Garald Balding, MD;  Location: [place];  Service: Orthopedics;  Laterality: Left;  . CHOLECYSTECTOMY  2004  . CTR Bilateral 1982  . CYSTOSCOPY  1974  . FOOT FUSION Left 06/22/2017  . HEMORRHOID SURGERY N/A 03/30/2017   Procedure: HEMORRHOIDECTOMY;  Surgeon: Coralie Keens, MD;  Location: [place];  Service: General;  Laterality: N/A;  . open reduction L little finger Left 1970  . Repair digital thumb, left Left 1990  . Right shoulder cuff repair Right 09/15/11  . Right shoulder SAD, DCR Right 02/05/11  . TOTAL HIP ARTHROPLASTY Left 07/26/06  . TOTAL HIP ARTHROPLASTY Right 1999   Social History   Social History Narrative   ortho PA, married, lives with spouse   Immunization History  Administered Date(s) Administered  . Influenza Split 12/21/2013  . Influenza, High Dose Seasonal PF 11/20/2017  . Influenza,inj,Quad PF,6+ Mos 12/11/2012, 11/20/2016  . Influenza-Unspecified 11/21/2014, 11/19/2015  . Pneumococcal Conjugate-13 02/19/2017  . Pneumococcal Polysaccharide-23 07/12/2015  . Tdap 07/12/2015     Objective: Vital Signs: BP 122/74 (BP Location: Right Arm,  Patient Position: Sitting, Cuff Size: Normal)   Pulse 68   Resp 15   Ht 5\' 11"  (1.803 m)   Wt 279 lb (126.6 kg)   BMI 38.91 kg/m    Physical Exam Vitals signs and nursing note reviewed.  Constitutional:      Appearance: He is well-developed.  HENT:     Head: Normocephalic and atraumatic.  Eyes:     Conjunctiva/sclera: Conjunctivae normal.     Pupils: Pupils are equal, round, and reactive to light.  Neck:     Musculoskeletal: Normal range of motion and neck supple.  Cardiovascular:     Rate and Rhythm: Normal rate and regular rhythm.     Heart sounds: Normal heart sounds.  Pulmonary:     Effort: Pulmonary effort is normal.     Breath sounds: Normal breath sounds.  Abdominal:     General: Bowel sounds are normal.     Palpations: Abdomen is soft.  Skin:    General: Skin is warm and dry.     Capillary Refill: Capillary  refill takes less than 2 seconds.  Neurological:     Mental Status: He is alert and oriented to person, place, and time.  Psychiatric:        Behavior: Behavior normal.      Musculoskeletal Exam: He has limited range of motion of his cervical spine.  He has thoracic kyphosis and limited range of motion of the lumbar spine.  Discomfort over SI joints.  He has painful range of motion of bilateral shoulder joints.  Elbow joints with good range of motion.  He had tenderness and swelling over left wrist joints.  No MCP PIP or DIP synovitis was noted.  PIP and DIP thickening and right CMC thickening was noted consistent with osteoarthritis.  Bilateral hip joints are replaced.  There was no warmth swelling or effusion in the knee joints.  He had brace in his left lower extremity from the foot fusion.  CDAI Exam: CDAI Score: Not documented Patient Global Assessment: Not documented; Provider Global Assessment: Not documented Swollen: Not documented; Tender: Not documented Joint Exam   Not documented   There is currently no information documented on the homunculus. Go  to the Rheumatology activity and complete the homunculus joint exam.  Investigation: No additional findings.  Imaging: No results found.  Recent Labs: Lab Results  Component Value Date   WBC 6.0 07/09/2018   HGB 16.4 07/09/2018   PLT 292 07/09/2018   NA 138 07/09/2018   K 4.3 07/09/2018   CL 102 07/09/2018   CO2 27 07/09/2018   GLUCOSE 88 07/09/2018   BUN 11 07/09/2018   CREATININE 0.88 07/09/2018   BILITOT 0.5 07/09/2018   ALKPHOS 52 09/12/2016   AST 21 07/09/2018   ALT 18 07/09/2018   PROT 6.6 07/09/2018   ALBUMIN 4.3 09/12/2016   CALCIUM 9.4 07/09/2018   GFRAA 104 07/09/2018  Jul 09, 2018 TSH normal, CRP normal, sed rate 9, vitamin D 55, B12 normal, uric acid 8.1  Speciality Comments: No specialty comments available.  Procedures:  No procedures performed Allergies: Nsaids; Tolmetin; and Penicillins   Assessment / Plan:     Visit Diagnoses: Idiopathic chronic gout of multiple sites without tophus -he has history of gout for many years.  He was taking Uloric for the last few years along with colchicine as needed.  He had had been having increased pain and discomfort in his left wrist joint.  Recent labs showed hyperuricemia.  We discussed to starting on allopurinol.  As he is taking a statin I would avoid colchicine.  We will start on allopurinol at lower dose starting at 50 mg and increase to 200 and then 200 followed by 300 mg as tolerated.  We will check uric acid level in 1 month along with CBC and CMP.  Plan: allopurinol (ZYLOPRIM) 100 MG tablet  Medication monitoring encounter - Plan: CBC with Differential/Platelet, COMPLETE METABOLIC PANEL WITH GFR, Uric acid  Inflammatory arthritis-he continues to have pain and swelling in his left wrist joint.  He tried Plaquenil in the past but could not tolerate the medication due to dizziness and headaches.  I will check rheumatoid factor, anti-CCP and 14 3 3  eta.  Primary osteoarthritis of both hands-he has severe  osteoarthritis in his hands.  Pes planus of both feet - Status post left midfoot fusion May 2019.  He is recovering well.    DDD (degenerative disc disease), cervical-he has limited range of motion of the cervical spine.  DDD (degenerative disc disease), lumbar-he has chronic lower back  pain.  History of hypertension-blood pressure is well controlled.  History of hypothyroidism Hashimotos  History of sleep apnea  History of gastroesophageal reflux (GERD)  History of diverticulitis  Status post bilateral total hip replacement  S/P right rotator cuff repair  History of vitamin D deficiency -vitamin D is normal.  Orders: Orders Placed This Encounter  Procedures  . CBC with Differential/Platelet  . COMPLETE METABOLIC PANEL WITH GFR  . Uric acid   Meds ordered this encounter  Medications  . allopurinol (ZYLOPRIM) 100 MG tablet    Sig: Take 1 tablet (100 mg total) by mouth daily for 7 days, THEN 2 tablets (200 mg total) daily for 7 days, THEN 3 tablets (300 mg total) daily for 14 days.    Dispense:  63 tablet    Refill:  0     Follow-Up Instructions: Return for Gout, Osteoarthritis.   Bo Merino, MD  Note - This record has been created using Editor, commissioning.  Chart creation errors have been sought, but may not always  have been located. Such creation errors do not reflect on  the standard of medical care.

## 2018-07-10 NOTE — Telephone Encounter (Signed)
Ok to refill on 07/18/18.

## 2018-07-10 NOTE — Progress Notes (Addendum)
Pharmacy Note   Subjective:  Patient presents today to the [facility] to see Dr. Estanislado Pandy.  Patient was prescribed Colcrys and allopurinol for gout.  Patient was seen by the pharmacist for counseling on allopurinol and colchicine.  He was previously on Uloric and Colcrys as needed for flares.  His insurance changed and was concerned about the cost of Uloric.  Allopurinol chosen due to affordability.   Objective: CBC    Component Value Date/Time   WBC 6.0 07/09/2018 1334   RBC 5.32 07/09/2018 1334   HGB 16.4 07/09/2018 1334   HCT 46.5 07/09/2018 1334   PLT 292 07/09/2018 1334   MCV 87.4 07/09/2018 1334   MCH 30.8 07/09/2018 1334   MCHC 35.3 07/09/2018 1334   RDW 13.0 07/09/2018 1334   LYMPHSABS 2,166 07/09/2018 1334   MONOABS 0.9 04/05/2018 1401   EOSABS 222 07/09/2018 1334   BASOSABS 42 07/09/2018 1334    CMP     Component Value Date/Time   NA 138 07/09/2018 1334   NA 141 09/29/2014   K 4.3 07/09/2018 1334   CL 102 07/09/2018 1334   CO2 27 07/09/2018 1334   GLUCOSE 88 07/09/2018 1334   BUN 11 07/09/2018 1334   BUN 12 09/29/2014   CREATININE 0.88 07/09/2018 1334   CALCIUM 9.4 07/09/2018 1334   PROT 6.6 07/09/2018 1334   ALBUMIN 4.3 09/12/2016 1708   AST 21 07/09/2018 1334   ALT 18 07/09/2018 1334   ALKPHOS 52 09/12/2016 1708   BILITOT 0.5 07/09/2018 1334   GFRNONAA 90 07/09/2018 1334   GFRAA 104 07/09/2018 1334    Lab Results  Component Value Date   LABURIC 8.1 (H) 07/09/2018     Assessment/Plan:   Counseled patient on the purpose proper use, and adverse effects of allopurinol and colchicine.  Discussed the importance of taking colchicine every day to lower uric acid levels.  The possibility of recurrent gout while lowering the uric acid was explained and discussed the importance of taking colchicine daily for one month then as needed for gout flare.  Provided patient with medication education material and dietary changes.     Drug interaction  between colchicine and statin. Will avoid colchicine.  We will start on allopurinol at lower dose starting at 50 mg and increase to 200 and then 200 followed by 300 mg as tolerated.  We will check uric acid level in 1 month along with CBC and CMP.   All questions encouraged and answered.  Instructed patient to call with any further questions or concerns.  Mariella Saa, PharmD, [facility] Rheumatology Clinical Pharmacist  07/10/2018 4:14 PM

## 2018-07-11 ENCOUNTER — Other Ambulatory Visit: Payer: Self-pay | Admitting: Internal Medicine

## 2018-07-11 ENCOUNTER — Other Ambulatory Visit: Payer: Self-pay | Admitting: Physician Assistant

## 2018-07-11 DIAGNOSIS — E785 Hyperlipidemia, unspecified: Secondary | ICD-10-CM

## 2018-07-17 LAB — THYROID PANEL WITH TSH
Free Thyroxine Index: 3 (ref 1.4–3.8)
T3 Uptake: 33 % (ref 22–35)
T4, Total: 9.1 ug/dL (ref 4.9–10.5)
TSH: 1.18 mIU/L (ref 0.40–4.50)

## 2018-07-17 LAB — CBC WITH DIFFERENTIAL/PLATELET
Absolute Monocytes: 684 cells/uL (ref 200–950)
Basophils Absolute: 42 cells/uL (ref 0–200)
Basophils Relative: 0.7 %
Eosinophils Absolute: 222 cells/uL (ref 15–500)
Eosinophils Relative: 3.7 %
HCT: 46.5 % (ref 38.5–50.0)
Hemoglobin: 16.4 g/dL (ref 13.2–17.1)
Lymphs Abs: 2166 cells/uL (ref 850–3900)
MCH: 30.8 pg (ref 27.0–33.0)
MCHC: 35.3 g/dL (ref 32.0–36.0)
MCV: 87.4 fL (ref 80.0–100.0)
MPV: 8.9 fL (ref 7.5–12.5)
Monocytes Relative: 11.4 %
Neutro Abs: 2886 cells/uL (ref 1500–7800)
Neutrophils Relative %: 48.1 %
Platelets: 292 10*3/uL (ref 140–400)
RBC: 5.32 10*6/uL (ref 4.20–5.80)
RDW: 13 % (ref 11.0–15.0)
Total Lymphocyte: 36.1 %
WBC: 6 10*3/uL (ref 3.8–10.8)

## 2018-07-17 LAB — TEST AUTHORIZATION

## 2018-07-17 LAB — COMPLETE METABOLIC PANEL WITH GFR
AG Ratio: 2 (calc) (ref 1.0–2.5)
ALT: 18 U/L (ref 9–46)
AST: 21 U/L (ref 10–35)
Albumin: 4.4 g/dL (ref 3.6–5.1)
Alkaline phosphatase (APISO): 54 U/L (ref 35–144)
BUN: 11 mg/dL (ref 7–25)
CO2: 27 mmol/L (ref 20–32)
Calcium: 9.4 mg/dL (ref 8.6–10.3)
Chloride: 102 mmol/L (ref 98–110)
Creat: 0.88 mg/dL (ref 0.70–1.25)
GFR, Est African American: 104 mL/min/{1.73_m2} (ref >=60)
GFR, Est Non African American: 90 mL/min/{1.73_m2} (ref >=60)
Globulin: 2.2 g/dL (calc) (ref 1.9–3.7)
Glucose, Bld: 88 mg/dL (ref 65–99)
Potassium: 4.3 mmol/L (ref 3.5–5.3)
Sodium: 138 mmol/L (ref 135–146)
Total Bilirubin: 0.5 mg/dL (ref 0.2–1.2)
Total Protein: 6.6 g/dL (ref 6.1–8.1)

## 2018-07-17 LAB — CYCLIC CITRUL PEPTIDE ANTIBODY, IGG: Cyclic Citrullin Peptide Ab: <16 UNITS

## 2018-07-17 LAB — VITAMIN B12: Vitamin B-12: 430 pg/mL (ref 200–1100)

## 2018-07-17 LAB — C-REACTIVE PROTEIN: CRP: 2.2 mg/L (ref <8.0)

## 2018-07-17 LAB — SEDIMENTATION RATE: Sed Rate: 9 mm/h (ref 0–20)

## 2018-07-17 LAB — RHEUMATOID FACTOR: Rheumatoid fact SerPl-aCnc: <14 IU/mL (ref <14)

## 2018-07-17 LAB — URIC ACID: Uric Acid, Serum: 8.1 mg/dL — ABNORMAL HIGH (ref 4.0–8.0)

## 2018-07-17 LAB — VITAMIN D 25 HYDROXY (VIT D DEFICIENCY, FRACTURES): Vit D, 25-Hydroxy: 55 ng/mL (ref 30–100)

## 2018-07-17 NOTE — Telephone Encounter (Signed)
All labs are negative except for the uric acid.

## 2018-07-22 ENCOUNTER — Ambulatory Visit (INDEPENDENT_AMBULATORY_CARE_PROVIDER_SITE_OTHER): Payer: Medicare Other

## 2018-07-22 ENCOUNTER — Encounter: Payer: Self-pay | Admitting: Orthopaedic Surgery

## 2018-07-22 ENCOUNTER — Other Ambulatory Visit: Payer: Self-pay

## 2018-07-22 ENCOUNTER — Ambulatory Visit (INDEPENDENT_AMBULATORY_CARE_PROVIDER_SITE_OTHER): Payer: Medicare Other | Admitting: Orthopaedic Surgery

## 2018-07-22 ENCOUNTER — Ambulatory Visit: Payer: Self-pay

## 2018-07-22 ENCOUNTER — Other Ambulatory Visit: Payer: Self-pay | Admitting: *Deleted

## 2018-07-22 VITALS — BP 131/62 | HR 61 | Temp 98.4°F | Ht 71.0 in | Wt 285.0 lb

## 2018-07-22 DIAGNOSIS — M25552 Pain in left hip: Secondary | ICD-10-CM

## 2018-07-22 DIAGNOSIS — M5442 Lumbago with sciatica, left side: Secondary | ICD-10-CM

## 2018-07-22 MED ORDER — TRAMADOL HCL 50 MG PO TABS: 50.0000 mg | ORAL_TABLET | Freq: Three times a day (TID) | ORAL | 0 refills | Status: DC | PRN

## 2018-07-22 MED ORDER — DICLOFENAC SODIUM 1 % TD GEL: TRANSDERMAL | 12 refills | Status: DC

## 2018-07-22 NOTE — Progress Notes (Addendum)
Office Visit Note   Patient: Jonathan Powers           Date of Birth: [birth year]           MRN: [id number] Visit Date: 07/22/2018              Requested by: [name], MD [address] [place], [place] [address] PCP: [name], MD   Assessment & Plan: Visit Diagnoses:  1. Pain in left hip   2. Acute left-sided low back pain with left-sided sciatica     Plan: Recent onset back groin pain may be a combination of factors including origin of the lower lumbar spine particularly L4-5 and L5-S1, left sacroiliac joint and possibly wear pattern of the left total hip replacement.  Cortisone injection over the area of tenderness in the lateral hip yesterday without much initial relief.  Will continue with Voltaren gel and observe response.  Consider injections next week or 2 depending upon symptoms.  No neurologic changes.  Has had some numbness and tingling in his left lower extremity after the popliteal block for his hindfoot fusion last year but no change since then Follow-Up Instructions: Return if symptoms worsen or fail to improve.   Orders:  Orders Placed This Encounter  Procedures   XR HIP UNILAT W OR W/O PELVIS 2-3 VIEWS LEFT   XR Lumbar Spine 2-3 Views   XR SI Joint 1-2 Views   No orders of the defined types were placed in this encounter.     Procedures: No procedures performed   Clinical Data: No additional findings.   Subjective: Chief Complaint  Patient presents with   Lower Back - Pain   Left Hip - Pain  Patient presents today with left lower back pain and left hip pain. Patient states that it has been hurting for about 48hours. No known injury. He has pain in his groin. He is having difficulty sleeping at night. He had his troch injected yesterday with no relief. He is taking tramadol for pain, but has ran out. He has numbness and tingling in his lower extremities.  He has a history of bilateral hip replacements. The left hip was  replaced at least 10 years ago. Patient states that it aches all the time, but worse with walking, sitting in the car, and getting up and down.  MRI scan of the lumbar spine was performed in March 2018.  This was reviewed.  It demonstrates degenerative appearing posterior endplate marrow edema from the L2-L4 level.  Mild spinal lateral recess and foraminal stenosis at those levels related to combined disc bulging and endplate spurring and posterior element hypertrophy.  He also had moderate chronic facet degeneration at L5-S1 worse on the right with mild right L5 no symptoms on the right.  Mild spinal stenosis at T12 with no mass-effect on the conus. Had a left SI joint injection by Dr. Ernestina Patches several years ago that alleviated a lot of his pain HPI  Review of Systems   Objective: Vital Signs: BP 131/62    Pulse 61    Temp 98.4 F (36.9 C) (Oral)    Ht 5\' 11"  (1.803 m)    Wt 285 lb (129.3 kg)    BMI 39.75 kg/m   Physical Exam Constitutional:      Appearance: He is well-developed.  Eyes:     Pupils: Pupils are equal, round, and reactive to light.  Pulmonary:     Effort: Pulmonary effort is normal.  Skin:    General: Skin is warm and dry.  Neurological:     Mental Status: He is alert and oriented to person, place, and time.  Psychiatric:        Behavior: Behavior normal.     Ortho Exam awake alert and oriented x3.  Comfortable sitting.  Some pain with internal/external rotation of left hip and with weightbearing with certain positions.  Some tenderness over the superior aspect of the left greater trochanter.  Straight leg raise was negative.  Some tenderness in the area of the left sacroiliac joint.  No percussible tenderness of lumbar spine.  Motor exam intact Specialty Comments:  No specialty comments available.  Imaging: Xr Hip Unilat W Or W/o Pelvis 2-3 Views Left  Result Date: 07/22/2018 2 views of the pelvis including both total hip replacements were obtained.  There is some  polyethylene wear on the left with slight superior physician of the.  Not much change from films in 2018.  No acute changes.  Xr Lumbar Spine 2-3 Views  Result Date: 07/22/2018 Films of the lumbar spine were obtained in 2 projections.  Sacroiliac joint films were also obtained.  There is some sclerosis about the left sacroiliac joint.  There is a advanced degenerative disc disease at L4-5.  2 films that were performed in 2018.  Facet sclerosis is also identified at L4-5 and L5-S1.  No evidence of spondylolisthesis or scoliosis.  There is diffuse calcification of the anterior longitudinal ligament    PMFS History: Patient Active Problem List   Diagnosis Date Noted   Pain in left hip 07/22/2018   Acute left-sided low back pain with left-sided sciatica 07/22/2018   Age-related osteoporosis with current pathological fracture 03/11/2018   Flexural eczema 07/24/2017   Routine general medical examination at a health care facility 10/12/2016   Chronic right-sided low back pain without sciatica 07/21/2016   Low testosterone 07/21/2016   Idiopathic chronic gout of multiple sites without tophus 07/21/2016   Superior mesenteric artery aneurysm ([place]) 07/12/2015   Left thyroid nodule    OSA (obstructive sleep apnea) 01/27/2013   Hypothyroid    Symptomatic PVCs 09/06/2012   Dyslipidemia, goal LDL below 130 09/06/2012   BPH (benign prostatic hyperplasia) 09/06/2012   OBESITY NOS 05/17/2006   Essential hypertension 05/17/2006   ALLERGIC RHINITIS 05/17/2006   OSTEOARTHRITIS 05/17/2006   Past Medical History:  Diagnosis Date   ALLERGIC RHINITIS    CELLULITIS, LEG, RIGHT    Recurrent R hip cellulitis 1/08,3/08    Diverticulitis    GERD (gastroesophageal reflux disease)    Gout    Hashimoto's thyroiditis    Hypertension    Hypogonadism male    low T, a/w ED   Hypothyroid    Left thyroid nodule 2008   on [place], decrease size in 2011 [place]   Migraines    Atypical and  ocular   OSTEOARTHRITIS    Seasonal allergies     Family History  Problem Relation Age of Onset   Arthritis Father    Heart disease Father    Diabetes Father    Vascular Disease Father        AoBifem   Arthritis Mother    Diabetes Mother    Multiple sclerosis Mother    Cancer Paternal Grandmother        "GI"   Cancer Paternal Grandfather        stomach cancer   Thyroid cancer Sister    Uterine cancer Sister  Past Surgical History:  Procedure Laterality Date   CARPAL TUNNEL RELEASE Left 03/30/2017   Procedure: CARPAL TUNNEL RELEASE;  Surgeon: Garald Balding, MD;  Location: [place];  Service: Orthopedics;  Laterality: Left;   CHOLECYSTECTOMY  2004   CTR Bilateral [place]   FOOT FUSION Left 06/22/2017   HEMORRHOID SURGERY N/A 03/30/2017   Procedure: HEMORRHOIDECTOMY;  Surgeon: Coralie Keens, MD;  Location: [place];  Service: General;  Laterality: N/A;   open reduction L little finger Left 1970   Repair digital thumb, left Left 1990   Right shoulder cuff repair Right 09/15/11   Right shoulder SAD, DCR Right 02/05/11   TOTAL HIP ARTHROPLASTY Left 07/26/06   TOTAL HIP ARTHROPLASTY Right 1999   Social History   Occupational History   Occupation: Orthopedic PA    Employer: [employer]  Tobacco Use   Smoking status: Never Smoker   Smokeless tobacco: Never Used  Substance and Sexual Activity   Alcohol use: Yes    Alcohol/week: 0.0 standard drinks    Comment: rare   Drug use: No   Sexual activity: Not on file

## 2018-07-22 NOTE — Telephone Encounter (Signed)
Last Visit: 07/10/18 UDS: 03/05/18 Narc Agreement: 03/05/18  Okay to refill Tramadol and Voltaren Gel?

## 2018-07-25 ENCOUNTER — Other Ambulatory Visit: Payer: Self-pay | Admitting: *Deleted

## 2018-07-25 DIAGNOSIS — M25552 Pain in left hip: Secondary | ICD-10-CM

## 2018-07-27 ENCOUNTER — Other Ambulatory Visit: Payer: Self-pay

## 2018-07-27 ENCOUNTER — Ambulatory Visit
Admission: RE | Admit: 2018-07-27 | Discharge: 2018-07-27 | Disposition: A | Discharge status: Home / Self Care | Payer: Medicare Other | Source: Ambulatory Visit | Attending: Orthopaedic Surgery | Admitting: Orthopaedic Surgery

## 2018-07-27 DIAGNOSIS — M25552 Pain in left hip: Secondary | ICD-10-CM

## 2018-07-27 DIAGNOSIS — S76312A Strain of muscle, fascia and tendon of the posterior muscle group at thigh level, left thigh, initial encounter: Secondary | ICD-10-CM | POA: Diagnosis not present

## 2018-08-06 ENCOUNTER — Other Ambulatory Visit: Payer: Self-pay | Admitting: Orthopaedic Surgery

## 2018-08-06 MED ORDER — METHOCARBAMOL 500 MG PO TABS: 500.0000 mg | ORAL_TABLET | Freq: Every evening | ORAL | 1 refills | Status: DC | PRN

## 2018-08-09 ENCOUNTER — Encounter: Payer: Self-pay | Admitting: Internal Medicine

## 2018-08-10 ENCOUNTER — Other Ambulatory Visit: Payer: Self-pay | Admitting: Internal Medicine

## 2018-08-10 DIAGNOSIS — G4700 Insomnia, unspecified: Secondary | ICD-10-CM

## 2018-08-10 DIAGNOSIS — G4730 Sleep apnea, unspecified: Secondary | ICD-10-CM

## 2018-08-10 MED ORDER — TRAZODONE HCL 100 MG PO TABS: 50.0000 mg | ORAL_TABLET | Freq: Every evening | ORAL | 1 refills | Status: DC | PRN

## 2018-08-12 ENCOUNTER — Other Ambulatory Visit: Payer: Self-pay | Admitting: Internal Medicine

## 2018-08-15 DIAGNOSIS — N5201 Erectile dysfunction due to arterial insufficiency: Secondary | ICD-10-CM | POA: Diagnosis not present

## 2018-08-15 DIAGNOSIS — N281 Cyst of kidney, acquired: Secondary | ICD-10-CM | POA: Diagnosis not present

## 2018-08-15 DIAGNOSIS — E291 Testicular hypofunction: Secondary | ICD-10-CM | POA: Diagnosis not present

## 2018-08-20 ENCOUNTER — Other Ambulatory Visit: Payer: Self-pay | Admitting: *Deleted

## 2018-08-20 DIAGNOSIS — M4807 Spinal stenosis, lumbosacral region: Secondary | ICD-10-CM

## 2018-09-02 ENCOUNTER — Ambulatory Visit
Admission: RE | Admit: 2018-09-02 | Discharge: 2018-09-02 | Disposition: A | Discharge status: Home / Self Care | Payer: Medicare Other | Source: Ambulatory Visit | Attending: Orthopaedic Surgery | Admitting: Orthopaedic Surgery

## 2018-09-02 ENCOUNTER — Other Ambulatory Visit: Payer: Self-pay

## 2018-09-02 DIAGNOSIS — M48061 Spinal stenosis, lumbar region without neurogenic claudication: Secondary | ICD-10-CM | POA: Diagnosis not present

## 2018-09-02 DIAGNOSIS — M4807 Spinal stenosis, lumbosacral region: Secondary | ICD-10-CM

## 2018-09-05 ENCOUNTER — Other Ambulatory Visit: Payer: Self-pay | Admitting: *Deleted

## 2018-09-05 MED ORDER — TRAMADOL HCL 50 MG PO TABS: 50.0000 mg | ORAL_TABLET | Freq: Three times a day (TID) | ORAL | 0 refills | Status: DC | PRN

## 2018-09-05 NOTE — Telephone Encounter (Signed)
Last Visit: 07/10/18 UDS: 03/05/18 Narc Agreement: 03/05/18  Okay to refill Tramadol?

## 2018-09-05 NOTE — Telephone Encounter (Signed)
ok 

## 2018-09-06 ENCOUNTER — Other Ambulatory Visit: Payer: Self-pay | Admitting: *Deleted

## 2018-09-06 ENCOUNTER — Telehealth: Payer: Self-pay | Admitting: Rheumatology

## 2018-09-06 MED ORDER — TRAMADOL HCL 50 MG PO TABS: 50.0000 mg | ORAL_TABLET | Freq: Three times a day (TID) | ORAL | 0 refills | Status: DC | PRN

## 2018-09-06 NOTE — Telephone Encounter (Signed)
faxed

## 2018-09-06 NOTE — Telephone Encounter (Signed)
Cecille Rubin, pharmacist at [facility] called stating they are unable to fill the prescription of Tramadol for the patient due to not having anymore tablets in stock and would not be able to fill until Monday, 09/06/18.  Cecille Rubin stated the patient requested the prescription be transferred to [facility] on [facility], but she cannot transfer controlled substances.  Cecille Rubin will cancel the prescription so a new one can be sent.

## 2018-09-23 ENCOUNTER — Encounter: Payer: Self-pay | Admitting: Internal Medicine

## 2018-09-23 ENCOUNTER — Other Ambulatory Visit: Payer: Self-pay | Admitting: Internal Medicine

## 2018-09-23 DIAGNOSIS — E039 Hypothyroidism, unspecified: Secondary | ICD-10-CM

## 2018-09-23 DIAGNOSIS — B002 Herpesviral gingivostomatitis and pharyngotonsillitis: Secondary | ICD-10-CM

## 2018-09-23 MED ORDER — VALACYCLOVIR HCL 1 G PO TABS: 1000.0000 mg | ORAL_TABLET | Freq: Two times a day (BID) | ORAL | 5 refills | Status: DC

## 2018-09-23 MED ORDER — LEVOTHYROXINE SODIUM 175 MCG PO TABS: 175.0000 ug | ORAL_TABLET | Freq: Every day | ORAL | 0 refills | Status: DC

## 2018-09-28 ENCOUNTER — Other Ambulatory Visit: Payer: Self-pay | Admitting: Orthopaedic Surgery

## 2018-09-30 NOTE — Telephone Encounter (Signed)
Ok to renew?  

## 2018-09-30 NOTE — Telephone Encounter (Signed)
thanks

## 2018-10-02 DIAGNOSIS — M960 Pseudarthrosis after fusion or arthrodesis: Secondary | ICD-10-CM | POA: Diagnosis not present

## 2018-10-02 DIAGNOSIS — M7732 Calcaneal spur, left foot: Secondary | ICD-10-CM | POA: Diagnosis not present

## 2018-10-02 DIAGNOSIS — M79671 Pain in right foot: Secondary | ICD-10-CM | POA: Diagnosis not present

## 2018-10-02 DIAGNOSIS — M85872 Other specified disorders of bone density and structure, left ankle and foot: Secondary | ICD-10-CM | POA: Diagnosis not present

## 2018-10-02 DIAGNOSIS — M7731 Calcaneal spur, right foot: Secondary | ICD-10-CM | POA: Diagnosis not present

## 2018-10-10 ENCOUNTER — Ambulatory Visit (INDEPENDENT_AMBULATORY_CARE_PROVIDER_SITE_OTHER): Payer: Medicare Other | Admitting: Cardiology

## 2018-10-10 ENCOUNTER — Encounter: Payer: Self-pay | Admitting: Cardiology

## 2018-10-10 ENCOUNTER — Ambulatory Visit: Payer: Medicare Other | Admitting: Cardiology

## 2018-10-10 ENCOUNTER — Other Ambulatory Visit: Payer: Self-pay | Admitting: *Deleted

## 2018-10-10 ENCOUNTER — Other Ambulatory Visit: Payer: Self-pay

## 2018-10-10 VITALS — BP 149/76 | HR 57 | Ht 71.0 in | Wt 267.0 lb

## 2018-10-10 DIAGNOSIS — I10 Essential (primary) hypertension: Secondary | ICD-10-CM | POA: Diagnosis not present

## 2018-10-10 DIAGNOSIS — E6609 Other obesity due to excess calories: Secondary | ICD-10-CM | POA: Diagnosis not present

## 2018-10-10 DIAGNOSIS — R072 Precordial pain: Secondary | ICD-10-CM

## 2018-10-10 DIAGNOSIS — R0600 Dyspnea, unspecified: Secondary | ICD-10-CM

## 2018-10-10 DIAGNOSIS — Z6837 Body mass index (BMI) 37.0-37.9, adult: Secondary | ICD-10-CM

## 2018-10-10 DIAGNOSIS — R0609 Other forms of dyspnea: Secondary | ICD-10-CM | POA: Diagnosis not present

## 2018-10-10 DIAGNOSIS — E782 Mixed hyperlipidemia: Secondary | ICD-10-CM | POA: Diagnosis not present

## 2018-10-10 MED ORDER — AMLODIPINE BESYLATE 2.5 MG PO TABS: 2.5000 mg | ORAL_TABLET | Freq: Every day | ORAL | 2 refills | Status: DC

## 2018-10-10 MED ORDER — LOSARTAN POTASSIUM 25 MG PO TABS: 25.0000 mg | ORAL_TABLET | Freq: Every day | ORAL | 2 refills | Status: DC

## 2018-10-10 NOTE — Progress Notes (Signed)
Please see AM notes.

## 2018-10-10 NOTE — Progress Notes (Signed)
Primary Physician/Referring:  [name], MD  Referring Bo Merino, MD Patient ID: Jonathan Powers, male    DOB: 19-Dec-1950, 68 y.o.   MRN: [id number]  Chief Complaint  Patient presents with  . Chest Pain  . Shortness of Breath  . Hypertension   HPI:    GERLAD Powers  is a 68 y.o. Caucasian male with history of idiopathic gout, hypertension, obesity, obstructive sleep apnea on CPAP, mild hypertriglyceridemia, exogenous type who presents to me to establish cardiac care, with worsening dyspnea and chest tightness that started about 2 weeks ago.  Chest pain is described as tightness to heaviness in the middle of the chest with no radiation, mostly noted during exertional activities and not at rest.  He is also noticed worsening dyspnea on exertion with minimal activities.  No leg edema, no painful swelling of the lower extremities, no recent long travel.  Past Medical History:  Diagnosis Date  . ALLERGIC RHINITIS   . CELLULITIS, LEG, RIGHT    Recurrent R hip cellulitis 1/08,3/08   . Diverticulitis   . GERD (gastroesophageal reflux disease)   . Gout   . Hashimoto's thyroiditis   . Hypertension   . Hypogonadism male    low T, a/w ED  . Hypothyroid   . Left thyroid nodule 2008   on [place], decrease size in 2011 [place]  . Migraines    Atypical and ocular  . OSTEOARTHRITIS   . Seasonal allergies    Past Surgical History:  Procedure Laterality Date  . CARPAL TUNNEL RELEASE Left 03/30/2017   Procedure: CARPAL TUNNEL RELEASE;  Surgeon: Garald Balding, MD;  Location: [place];  Service: Orthopedics;  Laterality: Left;  . CHOLECYSTECTOMY  2004  . CTR Bilateral 1982  . CYSTOSCOPY  1974  . FOOT FUSION Left 06/22/2017  . HEMORRHOID SURGERY N/A 03/30/2017   Procedure: HEMORRHOIDECTOMY;  Surgeon: Coralie Keens, MD;  Location: [place];  Service: General;  Laterality: N/A;  . open reduction L little finger Left 1970  . Repair digital thumb,  left Left 1990  . Right shoulder cuff repair Right 09/15/11  . Right shoulder SAD, DCR Right 02/05/11  . TOTAL HIP ARTHROPLASTY Left 07/26/06  . TOTAL HIP ARTHROPLASTY Right 1999   Social History   Socioeconomic History  . Marital status: Married    Spouse name: Not on file  . Number of children: Not on file  . Years of education: Not on file  . Highest education level: Not on file  Occupational History  . Occupation: Orthopedic PA    Employer: [employer]  . Financial resource strain: Not on file  . Food insecurity    Worry: Not on file    Inability: Not on file  . Transportation needs    Medical: Not on file    Non-medical: Not on file  Tobacco Use  . Smoking status: Never Smoker  . Smokeless tobacco: Never Used  Substance and Sexual Activity  . Alcohol use: Yes    Alcohol/week: 0.0 standard drinks    Comment: rare  . Drug use: No  . Sexual activity: Not on file  Lifestyle  . Physical activity    Days per week: Not on file    Minutes per session: Not on file  . Stress: Not on file  Relationships  . Social Herbalist on phone: Not on file    Gets together: Not on file  Attends religious service: Not on file    Active member of club or organization: Not on file    Attends meetings of clubs or organizations: Not on file    Relationship status: Not on file  . Intimate partner violence    Fear of current or ex partner: Not on file    Emotionally abused: Not on file    Physically abused: Not on file    Forced sexual activity: Not on file  Other Topics Concern  . Not on file  Social History Narrative   ortho PA, married, lives with spouse   ROS  Review of Systems  Constitution: Positive for malaise/fatigue. Negative for chills, decreased appetite and weight gain.  Cardiovascular: Positive for chest pain and dyspnea on exertion. Negative for leg swelling and syncope.  Endocrine: Negative for cold intolerance.  Hematologic/Lymphatic:  Does not bruise/bleed easily.  Musculoskeletal: Positive for back pain, joint pain and neck pain. Negative for joint swelling.  Gastrointestinal: Negative for abdominal pain, anorexia, change in bowel habit, hematochezia and melena.  Neurological: Negative for headaches and light-headedness.  Psychiatric/Behavioral: Negative for depression and substance abuse.  All other systems reviewed and are negative.  Objective  Blood pressure (!) 149/76, pulse (!) 57, height 5\' 11"  (1.803 m), weight 267 lb (121.1 kg), SpO2 97 %. Body mass index is 37.24 kg/m.   Physical Exam  Constitutional: He appears well-developed. No distress.  Moderately obese  HENT:  Head: Atraumatic.  Eyes: Conjunctivae are normal.  Neck: Neck supple. No thyromegaly present.  Short neck and difficult to evaluate JVP  Cardiovascular: Normal rate, regular rhythm and normal heart sounds. Exam reveals no gallop.  No murmur heard. Pulses:      Carotid pulses are 2+ on the right side and 2+ on the left side.      Dorsalis pedis pulses are 2+ on the right side and 2+ on the left side.       Posterior tibial pulses are 2+ on the right side and 2+ on the left side.  Femoral and popliteal pulse difficult to feel due to patient's body habitus.   Pulmonary/Chest: Effort normal and breath sounds normal.  Abdominal: Soft. Bowel sounds are normal.  Obese. Pannus present  Musculoskeletal: Normal range of motion.        General: No edema.  Neurological: He is alert.  Skin: Skin is warm and dry.  Psychiatric: He has a normal mood and affect.   Radiology: No results found.  Laboratory examination:   Recent Labs    01/08/18 2141 02/05/18 1629 04/04/18 1307 07/09/18 1334  NA 138 140 138 138  K 3.9 4.0 4.3 4.3  CL 105 102 104 102  CO2 25 28 24 27   GLUCOSE 100* 123* 97 88  BUN 10 18 14 11   CREATININE 0.94 0.94 1.01 0.88  CALCIUM 9.2 9.3 9.1 9.4  GFRNONAA >60  --  77 90  GFRAA >60  --  89 104   CMP Latest Ref Rng & Units  07/09/2018 04/04/2018 02/05/2018  Glucose 65 - 99 mg/dL 88 97 123(H)  BUN 7 - 25 mg/dL 11 14 18   Creatinine 0.70 - 1.25 mg/dL 0.88 1.01 0.94  Sodium 135 - 146 mmol/L 138 138 140  Potassium 3.5 - 5.3 mmol/L 4.3 4.3 4.0  Chloride 98 - 110 mmol/L 102 104 102  CO2 20 - 32 mmol/L 27 24 28   Calcium 8.6 - 10.3 mg/dL 9.4 9.1 9.3  Total Protein 6.1 - 8.1 g/dL 6.6 6.9 -  Total Bilirubin 0.2 - 1.2 mg/dL 0.5 0.8 -  Alkaline Phos 40 - 115 U/L - - -  AST 10 - 35 U/L 21 19 -  ALT 9 - 46 U/L 18 16 -   CBC Latest Ref Rng & Units 07/09/2018 04/05/2018 01/08/2018  WBC 3.8 - 10.8 Thousand/uL 6.0 6.2 7.5  Hemoglobin 13.2 - 17.1 g/dL 16.4 16.9 14.7  Hematocrit 38.5 - 50.0 % 46.5 48.7 45.3  Platelets 140 - 400 Thousand/uL 292 313.0 284   Lipid Panel     Component Value Date/Time   CHOL 149 02/05/2018 1629   TRIG 178.0 (H) 02/05/2018 1629   HDL 48.40 02/05/2018 1629   CHOLHDL 3 02/05/2018 1629   VLDL 35.6 02/05/2018 1629   LDLCALC 65 02/05/2018 1629   HEMOGLOBIN A1C Lab Results  Component Value Date   HGBA1C 5.6 09/29/2014   TSH Recent Labs    02/05/18 1629 07/09/18 1334  TSH 1.36 1.18   Medications   Prior to Admission medications   Medication Sig Start Date End Date Taking? Authorizing Provider  acetaminophen (TYLENOL) 500 MG tablet Take 1,000 mg by mouth 2 (two) times daily.   Yes [provider]  cetirizine (ZYRTEC) 10 MG tablet Take 10 mg by mouth daily.   Yes [provider]  Cholecalciferol (VITAMIN D-3) 5000 UNITS TABS Take by mouth. Takes Monday thru friday   Yes [provider]  diclofenac sodium (VOLTAREN) 1 % GEL 2-4 grams qid as needed for pain as discussed in office 07/22/18  Yes Ofilia Neas, PA-C  allopurinol (ZYLOPRIM) 100 MG tablet Take 1 tablet (100 mg total) by mouth daily for 7 days, THEN 2 tablets (200 mg total) daily for 7 days, THEN 3 tablets (300 mg total) daily for 14 days. 07/10/18 08/07/18  Bo Merino, MD  Esomeprazole Magnesium  (NEXIUM 24HR PO) Take 1 tablet by mouth daily as needed.     [provider]  fluocinonide-emollient (LIDEX-E) 0.05 % cream Apply 1 application topically 2 (two) times daily. 07/24/17   [name], MD  hydrocortisone-pramoxine (PROCTOFOAM [facility]) rectal foam Place 1 applicator rectally 2 (two) times daily. 04/05/18   Marrian Salvage, FNP  levothyroxine (SYNTHROID) 175 MCG tablet Take 1 tablet (175 mcg total) by mouth daily before breakfast. 09/23/18   [name], MD  methocarbamol (ROBAXIN) 500 MG tablet TAKE ONE TABLET BY MOUTH DAILY AT BEDTIME  09/30/18   Garald Balding, MD  ondansetron (ZOFRAN-ODT) 8 MG disintegrating tablet Take 1 tablet (8 mg total) by mouth every 8 (eight) hours as needed for nausea or vomiting. 04/05/18   Marrian Salvage, FNP  rosuvastatin (CRESTOR) 5 MG tablet TAKE ONE TABLET BY MOUTH ONE TIME DAILY  07/12/18   [name], MD  telmisartan (MICARDIS) 40 MG tablet Take 1 tablet (40 mg total) by mouth daily. 05/18/18   [name], MD  Teriparatide, Recombinant, 600 MCG/2.4ML SOLN Inject 0.08 mLs (20 mcg total) into the skin daily. 03/11/18   [name], MD  traMADol (ULTRAM) 50 MG tablet Take 1 tablet (50 mg total) by mouth 3 (three) times daily as needed. 09/06/18   Bo Merino, MD  traZODone (DESYREL) 100 MG tablet Take 0.5-1 tablets (50-100 mg total) by mouth at bedtime as needed for sleep. 08/10/18   [name], MD  valACYclovir (VALTREX) 1000 MG tablet Take 1 tablet (1,000 mg total) by mouth 2 (two) times daily. 09/23/18   [name], MD  Current Outpatient Medications  Medication Instructions  . acetaminophen (TYLENOL) 1,000 mg, 2 times daily  . allopurinol (ZYLOPRIM) 100 MG tablet Take 1 tablet (100 mg total) by mouth daily for 7 days, THEN 2 tablets (200 mg total) daily for 7 days, THEN 3 tablets (300 mg total) daily for 14 days.  [name] amLODipine (NORVASC) 2.5 mg, Oral, Daily  . cetirizine (ZYRTEC) 10 mg, Daily  .  Cholecalciferol (VITAMIN D-3) 5000 UNITS TABS Take by mouth. Takes Monday thru friday  . diclofenac sodium (VOLTAREN) 1 % GEL 2-4 grams qid as needed for pain as discussed in office  . Esomeprazole Magnesium (NEXIUM 24HR PO) 1 tablet, Oral, Daily PRN  . fluocinonide-emollient (LIDEX-E) 0.08 % cream 1 application, Topical, 2 times daily  . hydrocortisone-pramoxine (PROCTOFOAM HC) rectal foam 1 applicator, Rectal, 2 times daily  . levothyroxine (SYNTHROID) 175 mcg, Oral, Daily before breakfast  . methocarbamol (ROBAXIN) 500 MG tablet TAKE ONE TABLET BY MOUTH DAILY AT BEDTIME   . ondansetron (ZOFRAN ODT) 8 mg, Oral, Every 8 hours PRN  . rosuvastatin (CRESTOR) 5 MG tablet TAKE ONE TABLET BY MOUTH ONE TIME DAILY   . telmisartan (MICARDIS) 40 mg, Oral, Daily  . Teriparatide (Recombinant) 20 mcg, Subcutaneous, Daily  . traMADol (ULTRAM) 50 mg, Oral, 3 times daily PRN  . traZODone (DESYREL) 50-100 mg, Oral, At bedtime PRN  . valACYclovir (VALTREX) 1,000 mg, Oral, 2 times daily    Cardiac Studies:   Lexiscan Myoview stress test 09/30/2012: No evidence of ischemia.  Normal   LVEF.  Echocardiogram 09/11/2012: Normal LVEF, mild LVH.  Left atrium mildly dilated.  Assessment     ICD-10-CM   1. Precordial pain  R07.2 EKG 12-Lead    PCV ECHOCARDIOGRAM COMPLETE    CT CORONARY MORPH W/CTA COR W/SCORE W/CA W/CM &/OR WO/CM    CT CORONARY FRACTIONAL FLOW RESERVE DATA PREP    CT CORONARY FRACTIONAL FLOW RESERVE FLUID ANALYSIS  2. Dyspnea on exertion  R06.09 PCV ECHOCARDIOGRAM COMPLETE    CT CORONARY MORPH W/CTA COR W/SCORE W/CA W/CM &/OR WO/CM  3. Mixed hyperlipidemia  E78.2   4. Essential hypertension  I10 amLODipine (NORVASC) 2.5 MG tablet    DISCONTINUED: losartan (COZAAR) 25 MG tablet  5. Class 2 obesity due to excess calories without serious comorbidity with body mass index (BMI) of 37.0 to 37.9 in adult  E66.09    Z68.37     EKG 10/10/2018: Normal sinus rhythm at the rate of 66 bpm, normal axis.   No evidence of ischemia, normal EKG.  Recommendations:   In view of underlying cardiovascular risk factors that includes moderate obesity, mixed hyperlipidemia, hypertension, I have recommended coronary CTA.  He also needs an echocardiogram to evaluate LV systolic function along with evaluation for pulmonary hypertension.  Blood pressure is elevated as per home recordings, I started him on amlodipine 2.5 mg daily.    With regard to hyperlipidemia, he is presently on appropriate statin, elevated triglycerides are related to his diet and obesity.  Suspect this will improve with weight loss. Office visit after the test.  Adrian Prows, MD, [facility] 10/10/2018, 9:10 PM [facility]. [place] Pager: [phone] Office: [phone] If no answer Cell [phone]

## 2018-10-10 NOTE — Telephone Encounter (Signed)
Last Visit: 07/10/18 UDS: 03/05/18 Narc Agreement: 03/05/18  Okay to refill Tramadol?

## 2018-10-11 ENCOUNTER — Other Ambulatory Visit: Payer: Self-pay

## 2018-10-11 DIAGNOSIS — Z5181 Encounter for therapeutic drug level monitoring: Secondary | ICD-10-CM | POA: Diagnosis not present

## 2018-10-11 MED ORDER — TRAMADOL HCL 50 MG PO TABS: 50.0000 mg | ORAL_TABLET | Freq: Three times a day (TID) | ORAL | 0 refills | Status: DC | PRN

## 2018-10-11 NOTE — Telephone Encounter (Signed)
Patient is due to update UDS and narcotic agreement. Ok to refill.

## 2018-10-11 NOTE — Telephone Encounter (Signed)
Attempted to contact patient and left message on machine to advise patient to update UDS and narcotic agreement.

## 2018-10-13 LAB — PAIN MGMT, PROFILE 5 W/CONF, U
Amphetamines: NEGATIVE ng/mL
Barbiturates: NEGATIVE ng/mL
Benzodiazepines: NEGATIVE ng/mL
Cocaine Metabolite: NEGATIVE ng/mL
Creatinine: 230.3 mg/dL
Marijuana Metabolite: NEGATIVE ng/mL
Methadone Metabolite: NEGATIVE ng/mL
Opiates: NEGATIVE ng/mL
Oxidant: NEGATIVE ug/mL
Oxycodone: NEGATIVE ng/mL
pH: 5.6 (ref 4.5–9.0)

## 2018-10-13 LAB — PAIN MGMT, TRAMADOL W/MEDMATCH, U
Desmethyltramadol: NEGATIVE ng/mL
Tramadol: NEGATIVE ng/mL

## 2018-10-14 NOTE — Progress Notes (Signed)
UDS is negative for tramadol.  He takes tramadol very sparingly.

## 2018-10-16 ENCOUNTER — Ambulatory Visit: Payer: Self-pay | Admitting: Cardiology

## 2018-11-05 ENCOUNTER — Encounter: Payer: Self-pay | Admitting: Rheumatology

## 2018-11-05 ENCOUNTER — Other Ambulatory Visit: Payer: Self-pay

## 2018-11-06 ENCOUNTER — Other Ambulatory Visit: Payer: Self-pay | Admitting: Internal Medicine

## 2018-11-06 DIAGNOSIS — I10 Essential (primary) hypertension: Secondary | ICD-10-CM

## 2018-11-06 MED ORDER — TRAMADOL HCL 50 MG PO TABS: 50.0000 mg | ORAL_TABLET | Freq: Three times a day (TID) | ORAL | 0 refills | Status: DC | PRN

## 2018-11-06 NOTE — Telephone Encounter (Signed)
Ok to refill 

## 2018-11-06 NOTE — Telephone Encounter (Signed)
Last Visit: 07/10/18 UDS: 10/11/18 Narc Agreement: 10/11/18  Patient is asking for a 2 month supply stating that it is cheaper than getting the one month supply.   Okay to refill Tramadol?

## 2018-11-07 ENCOUNTER — Other Ambulatory Visit: Payer: Self-pay | Admitting: Cardiology

## 2018-11-07 ENCOUNTER — Other Ambulatory Visit: Payer: Self-pay | Admitting: Orthopaedic Surgery

## 2018-11-07 ENCOUNTER — Telehealth: Payer: Self-pay | Admitting: Rheumatology

## 2018-11-07 ENCOUNTER — Encounter: Payer: Self-pay | Admitting: Cardiology

## 2018-11-07 DIAGNOSIS — I10 Essential (primary) hypertension: Secondary | ICD-10-CM

## 2018-11-07 DIAGNOSIS — R072 Precordial pain: Secondary | ICD-10-CM

## 2018-11-07 MED ORDER — DOXYCYCLINE HYCLATE 100 MG PO CAPS: ORAL_CAPSULE | ORAL | 0 refills | Status: DC

## 2018-11-07 NOTE — Telephone Encounter (Signed)
Mickel Baas, pharmacist from [facility] left a voicemail requesting a return call regarding the prescription that was sent in for the patient.  Please call [phone]

## 2018-11-07 NOTE — Telephone Encounter (Signed)
Spoke with pharmacist to verify that prescription was faxed from our office. They needed supervising doctor and [place] number, Provided all requested information.

## 2018-11-08 ENCOUNTER — Telehealth (HOSPITAL_COMMUNITY): Payer: Self-pay | Admitting: Emergency Medicine

## 2018-11-08 LAB — BASIC METABOLIC PANEL
BUN/Creatinine Ratio: 13 (ref 10–24)
BUN: 13 mg/dL (ref 8–27)
CO2: 27 mmol/L (ref 20–29)
Calcium: 9.4 mg/dL (ref 8.6–10.2)
Chloride: 100 mmol/L (ref 96–106)
Creatinine, Ser: 1.03 mg/dL (ref 0.76–1.27)
GFR calc Af Amer: 86 mL/min/{1.73_m2} (ref >59)
GFR calc non Af Amer: 75 mL/min/{1.73_m2} (ref >59)
Glucose: 98 mg/dL (ref 65–99)
Potassium: 4.5 mmol/L (ref 3.5–5.2)
Sodium: 140 mmol/L (ref 134–144)

## 2018-11-08 NOTE — Telephone Encounter (Signed)
Reaching out to patient to offer assistance regarding upcoming cardiac imaging study; pt verbalizes understanding of appt date/time, parking situation and where to check in, pre-test NPO status and medications ordered, and verified current allergies; name and call back number provided for further questions should they arise Helana Macbride RN Navigator Cardiac Imaging [facility] [phone] office [phone] cell 

## 2018-11-09 ENCOUNTER — Encounter (HOSPITAL_COMMUNITY): Payer: Self-pay | Admitting: *Deleted

## 2018-11-09 ENCOUNTER — Inpatient Hospital Stay (HOSPITAL_COMMUNITY)
Admission: EM | Admit: 2018-11-09 | Discharge: 2018-11-13 | DRG: 603 | Disposition: A | Discharge status: Home / Self Care | Payer: Medicare Other | Attending: Internal Medicine | Admitting: Internal Medicine

## 2018-11-09 ENCOUNTER — Other Ambulatory Visit: Payer: Self-pay

## 2018-11-09 DIAGNOSIS — E669 Obesity, unspecified: Secondary | ICD-10-CM | POA: Diagnosis present

## 2018-11-09 DIAGNOSIS — L03116 Cellulitis of left lower limb: Principal | ICD-10-CM | POA: Diagnosis present

## 2018-11-09 DIAGNOSIS — M79672 Pain in left foot: Secondary | ICD-10-CM

## 2018-11-09 DIAGNOSIS — Z6841 Body Mass Index (BMI) 40.0 and over, adult: Secondary | ICD-10-CM | POA: Diagnosis not present

## 2018-11-09 DIAGNOSIS — K219 Gastro-esophageal reflux disease without esophagitis: Secondary | ICD-10-CM | POA: Diagnosis present

## 2018-11-09 DIAGNOSIS — I10 Essential (primary) hypertension: Secondary | ICD-10-CM | POA: Diagnosis present

## 2018-11-09 DIAGNOSIS — E785 Hyperlipidemia, unspecified: Secondary | ICD-10-CM | POA: Diagnosis present

## 2018-11-09 DIAGNOSIS — Z79890 Hormone replacement therapy (postmenopausal): Secondary | ICD-10-CM | POA: Diagnosis not present

## 2018-11-09 DIAGNOSIS — G8929 Other chronic pain: Secondary | ICD-10-CM | POA: Diagnosis present

## 2018-11-09 DIAGNOSIS — J302 Other seasonal allergic rhinitis: Secondary | ICD-10-CM | POA: Diagnosis present

## 2018-11-09 DIAGNOSIS — Z96643 Presence of artificial hip joint, bilateral: Secondary | ICD-10-CM | POA: Diagnosis not present

## 2018-11-09 DIAGNOSIS — Z20828 Contact with and (suspected) exposure to other viral communicable diseases: Secondary | ICD-10-CM | POA: Diagnosis present

## 2018-11-09 DIAGNOSIS — M545 Low back pain, unspecified: Secondary | ICD-10-CM | POA: Diagnosis present

## 2018-11-09 DIAGNOSIS — Z03818 Encounter for observation for suspected exposure to other biological agents ruled out: Secondary | ICD-10-CM | POA: Diagnosis not present

## 2018-11-09 DIAGNOSIS — E038 Other specified hypothyroidism: Secondary | ICD-10-CM | POA: Diagnosis present

## 2018-11-09 DIAGNOSIS — Z880 Allergy status to penicillin: Secondary | ICD-10-CM

## 2018-11-09 DIAGNOSIS — G4700 Insomnia, unspecified: Secondary | ICD-10-CM | POA: Diagnosis present

## 2018-11-09 DIAGNOSIS — Z79899 Other long term (current) drug therapy: Secondary | ICD-10-CM

## 2018-11-09 DIAGNOSIS — E063 Autoimmune thyroiditis: Secondary | ICD-10-CM | POA: Diagnosis present

## 2018-11-09 DIAGNOSIS — A6000 Herpesviral infection of urogenital system, unspecified: Secondary | ICD-10-CM | POA: Diagnosis present

## 2018-11-09 DIAGNOSIS — E039 Hypothyroidism, unspecified: Secondary | ICD-10-CM | POA: Diagnosis present

## 2018-11-09 DIAGNOSIS — L0390 Cellulitis, unspecified: Secondary | ICD-10-CM | POA: Diagnosis present

## 2018-11-09 LAB — CBC
HCT: 45.5 % (ref 39.0–52.0)
Hemoglobin: 15.2 g/dL (ref 13.0–17.0)
MCH: 30.2 pg (ref 26.0–34.0)
MCHC: 33.4 g/dL (ref 30.0–36.0)
MCV: 90.3 fL (ref 80.0–100.0)
Platelets: 334 10*3/uL (ref 150–400)
RBC: 5.04 MIL/uL (ref 4.22–5.81)
RDW: 12.8 % (ref 11.5–15.5)
WBC: 9.1 10*3/uL (ref 4.0–10.5)
nRBC: 0 % (ref 0.0–0.2)

## 2018-11-09 LAB — CK: Total CK: 152 U/L (ref 49–397)

## 2018-11-09 LAB — HEMOGLOBIN A1C
Hgb A1c MFr Bld: 5.1 % (ref 4.8–5.6)
Mean Plasma Glucose: 99.67 mg/dL

## 2018-11-09 LAB — LACTIC ACID, PLASMA: Lactic Acid, Venous: 1.7 mmol/L (ref 0.5–1.9)

## 2018-11-09 LAB — COMPREHENSIVE METABOLIC PANEL
ALT: 20 U/L (ref 0–44)
AST: 24 U/L (ref 15–41)
Albumin: 4 g/dL (ref 3.5–5.0)
Alkaline Phosphatase: 53 U/L (ref 38–126)
Anion gap: 13 (ref 5–15)
BUN: 11 mg/dL (ref 8–23)
CO2: 22 mmol/L (ref 22–32)
Calcium: 9.2 mg/dL (ref 8.9–10.3)
Chloride: 100 mmol/L (ref 98–111)
Creatinine, Ser: 1.23 mg/dL (ref 0.61–1.24)
GFR calc Af Amer: >60 mL/min (ref >60)
GFR calc non Af Amer: >60 mL/min (ref >60)
Glucose, Bld: 138 mg/dL — ABNORMAL HIGH (ref 70–99)
Potassium: 3.7 mmol/L (ref 3.5–5.1)
Sodium: 135 mmol/L (ref 135–145)
Total Bilirubin: 0.9 mg/dL (ref 0.3–1.2)
Total Protein: 7.2 g/dL (ref 6.5–8.1)

## 2018-11-09 MED ORDER — SODIUM CHLORIDE 0.9% FLUSH
3.0000 mL | Freq: Once | INTRAVENOUS | Status: AC
  Administered 2018-11-09: 3 mL via INTRAVENOUS

## 2018-11-09 NOTE — ED Provider Notes (Signed)
[facility] EMERGENCY DEPARTMENT Provider Note   CSN: [id number] Arrival date & time: 11/09/18  2029   History   Chief Complaint Chief Complaint  Patient presents with  . Cellulitis    HPI Jonathan Powers is a 68 y.o. pleasant male with history of left subtalar and talonavicular joint arthrodeses in May 2019 presenting with 3 weeks of left lower extremity swelling and redness.  He saw his primary care physician who started him on Keflex.  The patient completed 10-day course of Keflex, after which his redness slowly started to return.  Patient is now on day 3 of doxycycline, is unsure if the medicine is helping.  Otherwise denies fevers, shortness of breath, chest pain, nausea, and vomiting.  Past Medical History:  Diagnosis Date  . ALLERGIC RHINITIS   . CELLULITIS, LEG, RIGHT    Recurrent R hip cellulitis 1/08,3/08   . Diverticulitis   . GERD (gastroesophageal reflux disease)   . Gout   . Hashimoto's thyroiditis   . Hypertension   . Hypogonadism male    low T, a/w ED  . Hypothyroid   . Left thyroid nodule 2008   on [place], decrease size in 2011 [place]  . Migraines    Atypical and ocular  . OSTEOARTHRITIS   . Seasonal allergies     Patient Active Problem List   Diagnosis Date Noted  . Recurrent oral herpes simplex 09/23/2018  . Pain in left hip 07/22/2018  . Acute left-sided low back pain with left-sided sciatica 07/22/2018  . Age-related osteoporosis with current pathological fracture 03/11/2018  . Flexural eczema 07/24/2017  . Routine general medical examination at a health care facility 10/12/2016  . Chronic right-sided low back pain without sciatica 07/21/2016  . Low testosterone 07/21/2016  . Idiopathic chronic gout of multiple sites without tophus 07/21/2016  . Superior mesenteric artery aneurysm ([place]) 07/12/2015  . Left thyroid nodule   . OSA (obstructive sleep apnea) 01/27/2013  . Hypothyroid   . Symptomatic PVCs 09/06/2012  . Dyslipidemia, goal  LDL below 130 09/06/2012  . BPH (benign prostatic hyperplasia) 09/06/2012  . OBESITY NOS 05/17/2006  . Essential hypertension 05/17/2006  . ALLERGIC RHINITIS 05/17/2006  . OSTEOARTHRITIS 05/17/2006    Past Surgical History:  Procedure Laterality Date  . CARPAL TUNNEL RELEASE Left 03/30/2017   Procedure: CARPAL TUNNEL RELEASE;  Surgeon: Garald Balding, MD;  Location: [place];  Service: Orthopedics;  Laterality: Left;  . CHOLECYSTECTOMY  2004  . CTR Bilateral 1982  . CYSTOSCOPY  1974  . FOOT FUSION Left 06/22/2017  . HEMORRHOID SURGERY N/A 03/30/2017   Procedure: HEMORRHOIDECTOMY;  Surgeon: Coralie Keens, MD;  Location: [place];  Service: General;  Laterality: N/A;  . open reduction L little finger Left 1970  . Repair digital thumb, left Left 1990  . Right shoulder cuff repair Right 09/15/11  . Right shoulder SAD, DCR Right 02/05/11  . TOTAL HIP ARTHROPLASTY Left 07/26/06  . TOTAL HIP ARTHROPLASTY Right 1999     Home Medications    Prior to Admission medications   Medication Sig Start Date End Date Taking? Authorizing Provider  acetaminophen (TYLENOL) 500 MG tablet Take 1,000 mg by mouth 2 (two) times daily.    [provider]  allopurinol (ZYLOPRIM) 100 MG tablet Take 1 tablet (100 mg total) by mouth daily for 7 days, THEN 2 tablets (200 mg total) daily for 7 days, THEN 3 tablets (300 mg total) daily for 14 days. 07/10/18 08/07/18  Bo Merino, MD  amLODipine (NORVASC) 2.5 MG tablet Take 1 tablet (2.5 mg total) by mouth daily. 10/10/18 01/08/19  Adrian Prows, MD  cetirizine (ZYRTEC) 10 MG tablet Take 10 mg by mouth daily.    [provider]  Cholecalciferol (VITAMIN D-3) 5000 UNITS TABS Take by mouth. Takes Monday thru friday    [provider]  diclofenac sodium (VOLTAREN) 1 % GEL 2-4 grams qid as needed for pain as discussed in office 07/22/18   Ofilia Neas, PA-C  doxycycline (VIBRAMYCIN) 100 MG capsule 1 tablet PO  BID X 14 days 11/07/18   Garald Balding, MD  Esomeprazole Magnesium (NEXIUM 24HR PO) Take 1 tablet by mouth daily as needed.     [provider]  fluocinonide-emollient (LIDEX-E) 0.05 % cream Apply 1 application topically 2 (two) times daily. 07/24/17   [name], MD  hydrocortisone-pramoxine (PROCTOFOAM [facility]) rectal foam Place 1 applicator rectally 2 (two) times daily. 04/05/18   Marrian Salvage, FNP  levothyroxine (SYNTHROID) 175 MCG tablet Take 1 tablet (175 mcg total) by mouth daily before breakfast. 09/23/18   [name], MD  methocarbamol (ROBAXIN) 500 MG tablet TAKE ONE TABLET BY MOUTH DAILY AT BEDTIME  09/30/18   Garald Balding, MD  ondansetron (ZOFRAN-ODT) 8 MG disintegrating tablet Take 1 tablet (8 mg total) by mouth every 8 (eight) hours as needed for nausea or vomiting. 04/05/18   Marrian Salvage, FNP  rosuvastatin (CRESTOR) 5 MG tablet TAKE ONE TABLET BY MOUTH ONE TIME DAILY  07/12/18   [name], MD  telmisartan (MICARDIS) 40 MG tablet TAKE ONE TABLET BY MOUTH ONE TIME DAILY  11/06/18   [name], MD  Teriparatide, Recombinant, 600 MCG/2.4ML SOLN Inject 0.08 mLs (20 mcg total) into the skin daily. 03/11/18   [name], MD  traMADol (ULTRAM) 50 MG tablet Take 1 tablet (50 mg total) by mouth 3 (three) times daily as needed. 11/06/18   Ofilia Neas, PA-C  traZODone (DESYREL) 100 MG tablet Take 0.5-1 tablets (50-100 mg total) by mouth at bedtime as needed for sleep. 08/10/18   [name], MD  valACYclovir (VALTREX) 1000 MG tablet Take 1 tablet (1,000 mg total) by mouth 2 (two) times daily. 09/23/18   [name], MD    Family History Family History  Problem Relation Age of Onset  . Arthritis Father   . Heart disease Father   . Diabetes Father   . Vascular Disease Father        AoBifem  . Arthritis Mother   . Diabetes Mother   . Multiple sclerosis Mother   . Cancer Paternal Grandmother        "GI"  . Cancer Paternal  Grandfather        stomach cancer  . Thyroid cancer Sister   . Uterine cancer Sister     Social History Social History   Tobacco Use  . Smoking status: Never Smoker  . Smokeless tobacco: Never Used  Substance Use Topics  . Alcohol use: Yes    Alcohol/week: 0.0 standard drinks    Comment: rare  . Drug use: No    Allergies   Nsaids, Tolmetin, and Penicillins   Review of Systems Review of Systems - see HPI   Physical Exam Updated Vital Signs BP (!) 144/77 (BP Location: Left Arm)   Pulse 69   Temp 98.9 F (37.2 C) (Oral)   Resp (!) 23   Ht 5\' 10"  (1.778  m)   Wt 127.9 kg   SpO2 100%   BMI 40.46 kg/m   Physical Exam Constitutional:      Appearance: He is obese. He is not ill-appearing or toxic-appearing.  Cardiovascular:     Rate and Rhythm: Normal rate and regular rhythm.     Pulses: Normal pulses.     Heart sounds: Normal heart sounds. No murmur.  Pulmonary:     Effort: Pulmonary effort is normal. No respiratory distress.     Breath sounds: Normal breath sounds.  Abdominal:     General: Bowel sounds are normal.     Palpations: Abdomen is soft.  Musculoskeletal:        General: Swelling and tenderness (scant to LLE) present. No signs of injury.     Right lower leg: No edema.     Left lower leg: Edema (2+ nonpitting) present.  Skin:    Capillary Refill: Capillary refill takes less than 2 seconds.     Findings: Erythema (LLE) present.  Neurological:     General: No focal deficit present.     Mental Status: He is alert.  Psychiatric:        Mood and Affect: Mood normal.        Behavior: Behavior normal.        Thought Content: Thought content normal.        Judgment: Judgment normal.     Comments: Pleasant patient        ED Treatments / Results  Labs (all labs ordered are listed, but only abnormal results are displayed) Labs Reviewed  COMPREHENSIVE METABOLIC PANEL - Abnormal; Notable for the following components:      Result Value   Glucose,  Bld 138 (*)    All other components within normal limits  CBC  LACTIC ACID, PLASMA  LACTIC ACID, PLASMA    EKG None  Radiology No results found.  Procedures Procedures (including critical care time)  Medications Ordered in ED Medications  sodium chloride flush (NS) 0.9 % injection 3 mL (3 mLs Intravenous Given 11/09/18 2203)    Initial Impression / Assessment and Plan / ED Course  I have reviewed the triage vital signs and the nursing notes.  Pertinent labs & imaging results that were available during my care of the patient were reviewed by me and considered in my medical decision making (see chart for details).  Left lower extremity redness, edema: Findings most concerning for cellulitis, patient is already completed 1 course of Keflex, not sensitive for MRSA.  Patient only making minimal improvement on doxycycline (day 3).  DVT of left lower extremity should also be considered and ruled out.  Less concerning for anterior compartment syndrome or rhabdomyolysis (lactic acid and CK within normal limits). Patient remains afebrile, CBC and CMP within normal limits. Other vital signs reassuringly stable. -Patient would benefit from inpatient observation and IV antibiotics -Also plan for lower extremity ultrasound to evaluate for DVT in the morning 9/20   Final Clinical Impressions(s) / ED Diagnoses   Final diagnoses:  None    ED Discharge Orders    None      Milus Banister, [place], PGY-2 11/09/2018 10:06 PM    Daisy Floro, DO 11/09/18 2335    Elnora Morrison, MD 11/10/18 2355

## 2018-11-09 NOTE — ED Provider Notes (Signed)
[facility] EMERGENCY DEPARTMENT Provider Note   CSN: [id number] Arrival date & time: 11/09/18  2029   History   Chief Complaint Chief Complaint  Patient presents with  . Cellulitis    HPI Jonathan Powers is a pleasant 68 y.o. male presenting for progressively worsening redness and swelling to his left lower extremity concerning for cellulitis. Three weeks ago the patient began an antibiotic regimen of Keflex. He completed the 10-day course and saw modest improvement in his lower extremity swelling and redness. The symptoms, however, returned, and he is currently on day 3 of a new antibiotic course, this time doxycycline. He otherwise denies concerning signs such as fevers, chills, body aches, nausea, vomiting, chest pain, abdominal pains, and focal neurological deficits.  Past Medical History:  Diagnosis Date  . ALLERGIC RHINITIS   . CELLULITIS, LEG, RIGHT    Recurrent R hip cellulitis 1/08,3/08   . Diverticulitis   . GERD (gastroesophageal reflux disease)   . Gout   . Hashimoto's thyroiditis   . Hypertension   . Hypogonadism male    low T, a/w ED  . Hypothyroid   . Left thyroid nodule 2008   on [place], decrease size in 2011 [place]  . Migraines    Atypical and ocular  . OSTEOARTHRITIS   . Seasonal allergies     Patient Active Problem List   Diagnosis Date Noted  . Recurrent genital HSV (herpes simplex virus) infection 11/12/2018  . Left leg cellulitis 11/11/2018  . HLD (hyperlipidemia) 11/11/2018  . Other specified hypothyroidism 11/11/2018  . Chronic low back pain 11/11/2018  . Cellulitis of left lower extremity 11/10/2018  . Cellulitis 11/10/2018  . Insomnia 11/10/2018  . GERD (gastroesophageal reflux disease) 11/10/2018  . Seasonal allergies 11/10/2018  . Recurrent oral herpes simplex 09/23/2018  . Pain in left hip 07/22/2018  . Acute left-sided low back pain with left-sided sciatica 07/22/2018  . Age-related osteoporosis with current pathological  fracture 03/11/2018  . Flexural eczema 07/24/2017  . Routine general medical examination at a health care facility 10/12/2016  . Chronic right-sided low back pain without sciatica 07/21/2016  . Low testosterone 07/21/2016  . Idiopathic chronic gout of multiple sites without tophus 07/21/2016  . Superior mesenteric artery aneurysm ([place]) 07/12/2015  . Left thyroid nodule   . OSA (obstructive sleep apnea) 01/27/2013  . Hypothyroid   . Symptomatic PVCs 09/06/2012  . Dyslipidemia, goal LDL below 130 09/06/2012  . BPH (benign prostatic hyperplasia) 09/06/2012  . OBESITY NOS 05/17/2006  . Essential hypertension 05/17/2006  . ALLERGIC RHINITIS 05/17/2006  . OSTEOARTHRITIS 05/17/2006    Past Surgical History:  Procedure Laterality Date  . CARPAL TUNNEL RELEASE Left 03/30/2017   Procedure: CARPAL TUNNEL RELEASE;  Surgeon: Garald Balding, MD;  Location: [place];  Service: Orthopedics;  Laterality: Left;  . CHOLECYSTECTOMY  2004  . CTR Bilateral 1982  . CYSTOSCOPY  1974  . FOOT FUSION Left 06/22/2017  . HEMORRHOID SURGERY N/A 03/30/2017   Procedure: HEMORRHOIDECTOMY;  Surgeon: Coralie Keens, MD;  Location: [place];  Service: General;  Laterality: N/A;  . open reduction L little finger Left 1970  . Repair digital thumb, left Left 1990  . Right shoulder cuff repair Right 09/15/11  . Right shoulder SAD, DCR Right 02/05/11  . TOTAL HIP ARTHROPLASTY Left 07/26/06  . TOTAL HIP ARTHROPLASTY Right 1999        Home Medications    Prior to Admission medications   Medication Sig  Start Date End Date Taking? Authorizing Provider  acetaminophen (TYLENOL) 500 MG tablet Take 1,000 mg by mouth 2 (two) times daily.   Yes [provider]  amLODipine (NORVASC) 2.5 MG tablet Take 1 tablet (2.5 mg total) by mouth daily. 10/10/18 01/08/19 Yes Adrian Prows, MD  cetirizine (ZYRTEC) 10 MG tablet Take 10 mg by mouth daily.   Yes [provider]   Cholecalciferol (VITAMIN D-3) 5000 UNITS TABS Take 5,000 Units by mouth daily.    Yes [provider]  doxycycline (VIBRAMYCIN) 100 MG capsule 1 tablet PO BID X 14 days Patient taking differently: Take 100 mg by mouth 2 (two) times daily.  11/07/18  Yes Garald Balding, MD  esomeprazole (NEXIUM) 20 MG capsule Take 20 mg by mouth daily as needed (for acid reflux).   Yes [provider]  levothyroxine (SYNTHROID) 175 MCG tablet Take 1 tablet (175 mcg total) by mouth daily before breakfast. 09/23/18  Yes [name], MD  methocarbamol (ROBAXIN) 500 MG tablet TAKE ONE TABLET BY MOUTH DAILY AT BEDTIME  Patient taking differently: Take 500 mg by mouth every 8 (eight) hours as needed for muscle spasms.  09/30/18  Yes Garald Balding, MD  rosuvastatin (CRESTOR) 5 MG tablet TAKE ONE TABLET BY MOUTH ONE TIME DAILY  Patient taking differently: Take 5 mg by mouth at bedtime.  07/12/18  Yes [name], MD  telmisartan (MICARDIS) 40 MG tablet TAKE ONE TABLET BY MOUTH ONE TIME DAILY  Patient taking differently: Take 40 mg by mouth daily.  11/06/18  Yes [name], MD  traMADol (ULTRAM) 50 MG tablet Take 1 tablet (50 mg total) by mouth 3 (three) times daily as needed. Patient taking differently: Take 50 mg by mouth at bedtime.  11/06/18  Yes Ofilia Neas, PA-C  traZODone (DESYREL) 100 MG tablet Take 0.5-1 tablets (50-100 mg total) by mouth at bedtime as needed for sleep. Patient taking differently: Take 50 mg by mouth at bedtime.  08/10/18  Yes [name], MD  allopurinol (ZYLOPRIM) 100 MG tablet Take 1 tablet (100 mg total) by mouth daily for 7 days, THEN 2 tablets (200 mg total) daily for 7 days, THEN 3 tablets (300 mg total) daily for 14 days. Patient not taking: Reported on 11/09/2018 07/10/18 11/09/27  Bo Merino, MD  diclofenac sodium (VOLTAREN) 1 % GEL 2-4 grams qid as needed for pain as discussed in office Patient not taking: Reported on 11/09/2018 07/22/18   Ofilia Neas, PA-C  fluocinonide-emollient (LIDEX-E) 0.05 % cream Apply 1 application topically 2 (two) times daily. Patient not taking: Reported on 11/09/2018 07/24/17   [name], MD  hydrocortisone-pramoxine (PROCTOFOAM [facility]) rectal foam Place 1 applicator rectally 2 (two) times daily. Patient not taking: Reported on 11/09/2018 04/05/18   Marrian Salvage, FNP  ondansetron (ZOFRAN-ODT) 8 MG disintegrating tablet Take 1 tablet (8 mg total) by mouth every 8 (eight) hours as needed for nausea or vomiting. Patient not taking: Reported on 11/09/2018 04/05/18   Marrian Salvage, FNP  Teriparatide, Recombinant, 600 MCG/2.4ML SOLN Inject 0.08 mLs (20 mcg total) into the skin daily. Patient not taking: Reported on 11/09/2018 03/11/18   [name], MD  valACYclovir (VALTREX) 1000 MG tablet Take 1 tablet (1,000 mg total) by mouth 2 (two) times daily. Patient not taking: Reported on 11/09/2018 09/23/18   [name], MD    Family History Family History  Problem Relation Age of Onset  . Arthritis Father   .  Heart disease Father   . Diabetes Father   . Vascular Disease Father        AoBifem  . Arthritis Mother   . Diabetes Mother   . Multiple sclerosis Mother   . Cancer Paternal Grandmother        "GI"  . Cancer Paternal Grandfather        stomach cancer  . Thyroid cancer Sister   . Uterine cancer Sister     Social History Social History   Tobacco Use  . Smoking status: Never Smoker  . Smokeless tobacco: Never Used  Substance Use Topics  . Alcohol use: Yes    Alcohol/week: 0.0 standard drinks    Comment: rare  . Drug use: No    Allergies   Nsaids, Tolmetin, and Penicillins   Review of Systems Review of Systems - see HPI   Physical Exam Updated Vital Signs BP 125/77 (BP Location: Left Arm)   Pulse (!) 57   Temp 99.3 F (37.4 C) (Oral)   Resp 20   Ht 5\' 10"  (1.778 m)   Wt 127.9 kg   SpO2 98%   BMI 40.46 kg/m   Physical Exam Constitutional:       Appearance: He is obese. He is not ill-appearing.  Cardiovascular:     Rate and Rhythm: Normal rate and regular rhythm.     Pulses: Normal pulses.     Heart sounds: Normal heart sounds. No murmur.  Pulmonary:     Effort: Pulmonary effort is normal. No respiratory distress.     Breath sounds: Normal breath sounds.  Abdominal:     General: Bowel sounds are normal.     Palpations: Abdomen is soft.  Musculoskeletal: Normal range of motion.        General: Swelling (LLE) present.     Left lower leg: Edema present.  Skin:    Capillary Refill: Capillary refill takes less than 2 seconds.     Findings: Erythema (LLE) present.  Neurological:     General: No focal deficit present.     Mental Status: He is alert.     Sensory: Sensory deficit (chronic to LLE (since May 2019)) present.     Motor: No weakness.     LEFT LOWER EXTREMITY   ED Treatments / Results  Labs (all labs ordered are listed, but only abnormal results are displayed) Labs Reviewed  COMPREHENSIVE METABOLIC PANEL - Abnormal; Notable for the following components:      Result Value   Glucose, Bld 138 (*)    All other components within normal limits  BASIC METABOLIC PANEL - Abnormal; Notable for the following components:   Glucose, Bld 110 (*)    All other components within normal limits  SARS CORONAVIRUS 2 (TAT 6-24 HRS)  CULTURE, BLOOD (ROUTINE X 2)  CULTURE, BLOOD (ROUTINE X 2)  CBC  LACTIC ACID, PLASMA  HEMOGLOBIN A1C  CK  CBC  CBC  CBC WITH DIFFERENTIAL/PLATELET  BASIC METABOLIC PANEL    EKG None  Radiology Mr Tibia Fibula Left W Wo Contrast  Result Date: 11/10/2018 CLINICAL DATA:  Ankle swelling, cellulitis pain EXAM: MRI OF LOWER LEFT EXTREMITY WITHOUT AND WITH CONTRAST TECHNIQUE: Multiplanar, multisequence MR imaging of the left lower extremity was performed both before and after administration of intravenous contrast. CONTRAST:  82mL GADAVIST GADOBUTROL 1 MMOL/ML IV SOLN COMPARISON:  None. FINDINGS:  Bones/Joint/Cartilage No fracture or avascular necrosis. There is subchondral marrow signal changes seen at the lateral tibial plateau and patellofemoral compartment, from  osteoarthritis. No pathologic marrow infiltration. No evidence of abnormal enhancement. No large knee joint effusion is seen. There is a loculated popliteal cyst seen measuring 3 cm. No evidence of cyst rupture. Ligaments The ACL and PCL are intact. The ligaments of the ankle are suboptimally visualized. Muscles and Tendons There is mild fatty atrophy seen within the muscles of the lower extremity. The muscles are otherwise intact. No focal muscular tear or areas focal enhancement. The tendons are intact without evidence of tendinopathy or tear. Soft tissues There is mildly enhancing diffuse skin thickening and subcutaneous edema seen surrounding the mid to lower leg. No loculated fluid collections are seen within the soft tissues. No enhancing soft tissue mass. IMPRESSION: Findings suggestive of cellulitis involving the mid to lower leg. No soft tissue abscess or other acute abnormality. No evidence of osteomyelitis. Lateral patellofemoral compartment chondral disease. Loculated popliteal cyst without evidence of rupture Electronically Signed   By: Prudencio Pair M.D.   On: 11/10/2018 19:01   Ct Coronary Morph W/cta Cor W/score W/ca W/cm &/or Wo/cm  Addendum Date: 11/11/2018   ADDENDUM REPORT: 11/11/2018 21:46 HISTORY: 68 yo male with exertional chest pain and dyspnea EXAM: Cardiac/Coronary CTA TECHNIQUE: The patient was scanned on a [employer]. PROTOCOL: A 120 kV prospective scan was triggered in the descending thoracic aorta at 111 HU's. Axial non-contrast 3 mm slices were carried out through the heart. The data set was analyzed on a dedicated work station and scored using the [place]. Gantry rotation speed was 250 msecs and collimation was .6 mm. Beta blockade and 0.8 mg of sl NTG was given. The 3D data set was  reconstructed in 5% intervals of the 67-82 % of the R-R cycle. Diastolic phases were analyzed on a dedicated work station using MPR, MIP and VRT modes. The patient received 17mL OMNIPAQUE IOHEXOL 350 MG/ML SOLN of contrast. FINDINGS: Quality: Excellent Coronary calcium score: The patient's coronary artery calcium score is 250.78, which places the patient in the 66th percentile. Coronary arteries: Normal coronary origins.  Right dominance. Right Coronary Artery: Dominant vessel. Large conus branch. Minimal 1-24% proximal mixed stenosis (CADRADS1). Left Main Coronary Artery: No significant obstruction. Left Anterior Descending Coronary Artery: Large vessel which gives off 2 moderate sized diagonal branches. There is minimal mixed 1-24% stenosis (CADRADS1) of the proximal to mid-LAD. Calcium is noted at the ostium of a small (<2.0 mm) D2 branch without significant obstruction. Left Circumflex Artery: Large caliber vessel which gives off a large OM1 branch and several smaller distal OM2 branches. There is minimal 1-24% stenosis (CADRADS1) at the LCx/OM1 bifurcation and seen in the ostial OM1. Aorta: Normal size, 33 mm at the mid ascending aorta (level of the PA bifurcation) measured double oblique. Atherosclerotic calcification noted. No dissection. Aortic Valve: Trileaflet.  No calcifications. Other findings: Normal pulmonary vein drainage into the left atrium. Normal left atrial appendage without a thrombus. Normal size of the pulmonary artery. IMPRESSION: 1. Minimal non-obstructive CAD, CADRADS = 0. 2. Coronary calcium score of 250.78. This was 66th percentile for age and sex matched control. 3. Normal coronary origin with right dominance. 4. Atherosclerotic calcification of the aorta. Electronically Signed   By: Pixie Casino M.D.   On: 11/11/2018 21:46   Result Date: 11/11/2018 EXAM: OVER-READ INTERPRETATION  CT CHEST The following report is an over-read performed by radiologist Dr. Rolm Baptise of [facility], [place] on 11/11/2018. This over-read does not include interpretation of cardiac or coronary anatomy or pathology. The coronary CTA  interpretation by the cardiologist is attached. COMPARISON:  None. FINDINGS: Vascular: Heart is normal size.  Aorta normal caliber. Mediastinum/Nodes: No adenopathy in the lower mediastinum or hila. Lungs/Pleura: Visualized lungs clear.  No effusions. Upper Abdomen: Imaging into the upper abdomen shows no acute findings. Musculoskeletal: Chest wall soft tissues are unremarkable. No acute bony abnormality. IMPRESSION: No acute or significant extracardiac abnormality. Electronically Signed: By: Rolm Baptise M.D. On: 11/11/2018 15:53   Procedures Procedures (including critical care time)  Medications Ordered in ED Medications  enoxaparin (LOVENOX) injection 60 mg (60 mg Subcutaneous Not Given 11/12/18 1135)  acetaminophen (TYLENOL) tablet 650 mg (650 mg Oral Given 11/12/18 1410)    Or  acetaminophen (TYLENOL) suppository 650 mg ( Rectal See Alternative 11/12/18 1410)  oxyCODONE (Oxy IR/ROXICODONE) immediate release tablet 5 mg (has no administration in time range)  polyethylene glycol (MIRALAX / GLYCOLAX) packet 17 g (has no administration in time range)  ondansetron (ZOFRAN) tablet 4 mg (has no administration in time range)    Or  ondansetron (ZOFRAN) injection 4 mg (has no administration in time range)  amLODipine (NORVASC) tablet 2.5 mg (2.5 mg Oral Given 11/12/18 0915)  loratadine (CLARITIN) tablet 10 mg (10 mg Oral Given 11/12/18 0915)  pantoprazole (PROTONIX) EC tablet 40 mg (40 mg Oral Given 11/12/18 0915)  levothyroxine (SYNTHROID) tablet 175 mcg (175 mcg Oral Given 11/12/18 0545)  rosuvastatin (CRESTOR) tablet 5 mg (5 mg Oral Given 11/12/18 0915)  methocarbamol (ROBAXIN) tablet 500 mg (has no administration in time range)  irbesartan (AVAPRO) tablet 150 mg (150 mg Oral Given 11/12/18 0915)  traMADol (ULTRAM) tablet 50 mg (50 mg Oral Given 11/12/18 1410)   traZODone (DESYREL) tablet 50-100 mg (50 mg Oral Given 11/11/18 2255)  calcium carbonate (TUMS - dosed in mg elemental calcium) chewable tablet 400 mg of elemental calcium (has no administration in time range)  cefTRIAXone (ROCEPHIN) 1 g in sodium chloride 0.9 % 100 mL IVPB (1 g Intravenous New Bag/Given 11/12/18 0914)  vancomycin (VANCOCIN) IVPB 1000 mg/200 mL premix (1,000 mg Intravenous New Bag/Given 11/12/18 0959)  nitroGLYCERIN (NITROSTAT) 0.4 MG SL tablet (has no administration in time range)  valACYclovir (VALTREX) tablet 500 mg (has no administration in time range)  sodium chloride flush (NS) 0.9 % injection 3 mL (3 mLs Intravenous Given 11/09/18 2203)  vancomycin (VANCOCIN) 2,000 mg in sodium chloride 0.9 % 500 mL IVPB (0 mg Intravenous Stopped 11/10/18 1250)  gadobutrol (GADAVIST) 1 MMOL/ML injection 10 mL (10 mLs Intravenous Contrast Given 11/10/18 1835)  nitroGLYCERIN (NITROSTAT) SL tablet 0.8 mg (0.8 mg Sublingual Given 11/11/18 1422)    Initial Impression / Assessment and Plan / ED Course  I have reviewed the triage vital signs and the nursing notes.  Pertinent labs & imaging results that were available during my care of the patient were reviewed by me and considered in my medical decision making (see chart for details).  Lower extremity swelling/redness: symptoms concerning for cellulitis. Vitals at this time are reassuring, patient is afebrile, satting well, normal blood pressure and heart rate.  -Contact hospitalist for admission and IV antibiotics -CMP, CBC, CK, Lactic Acid -Blood culture -COVID test for admission (no concerning symptoms) -Patient may need further imaging to evaluate for compartment syndrome vs DVT vs other cause of swelling/redness  Final Clinical Impressions(s) / ED Diagnoses   Final diagnoses:  Cellulitis of left lower extremity    ED Discharge Orders    None     Milus Banister, [place], PGY-2  11/12/2018 3:34 PM     Daisy Floro, DO 11/12/18 1537    Elnora Morrison, MD 11/12/18 [phone]

## 2018-11-09 NOTE — ED Triage Notes (Signed)
The pt is c.o redness and swelling to his lt lower leg  For 2 weeks  He has been on 2 different antibiotics for the past 2 weeks  More redness and swelling today

## 2018-11-10 ENCOUNTER — Observation Stay (HOSPITAL_BASED_OUTPATIENT_CLINIC_OR_DEPARTMENT_OTHER): Payer: Medicare Other

## 2018-11-10 ENCOUNTER — Observation Stay (HOSPITAL_COMMUNITY): Payer: Medicare Other

## 2018-11-10 ENCOUNTER — Other Ambulatory Visit: Payer: Self-pay

## 2018-11-10 ENCOUNTER — Inpatient Hospital Stay (HOSPITAL_COMMUNITY): Payer: Medicare Other

## 2018-11-10 DIAGNOSIS — J302 Other seasonal allergic rhinitis: Secondary | ICD-10-CM | POA: Diagnosis present

## 2018-11-10 DIAGNOSIS — L0390 Cellulitis, unspecified: Secondary | ICD-10-CM | POA: Diagnosis not present

## 2018-11-10 DIAGNOSIS — Z6841 Body Mass Index (BMI) 40.0 and over, adult: Secondary | ICD-10-CM | POA: Diagnosis not present

## 2018-11-10 DIAGNOSIS — L03116 Cellulitis of left lower limb: Secondary | ICD-10-CM

## 2018-11-10 DIAGNOSIS — G4700 Insomnia, unspecified: Secondary | ICD-10-CM | POA: Diagnosis present

## 2018-11-10 DIAGNOSIS — R072 Precordial pain: Secondary | ICD-10-CM | POA: Diagnosis not present

## 2018-11-10 DIAGNOSIS — M545 Low back pain: Secondary | ICD-10-CM | POA: Diagnosis not present

## 2018-11-10 DIAGNOSIS — E785 Hyperlipidemia, unspecified: Secondary | ICD-10-CM | POA: Diagnosis not present

## 2018-11-10 DIAGNOSIS — L538 Other specified erythematous conditions: Secondary | ICD-10-CM

## 2018-11-10 DIAGNOSIS — G8929 Other chronic pain: Secondary | ICD-10-CM | POA: Diagnosis not present

## 2018-11-10 DIAGNOSIS — E038 Other specified hypothyroidism: Secondary | ICD-10-CM | POA: Diagnosis not present

## 2018-11-10 DIAGNOSIS — K219 Gastro-esophageal reflux disease without esophagitis: Secondary | ICD-10-CM | POA: Diagnosis present

## 2018-11-10 DIAGNOSIS — E7800 Pure hypercholesterolemia, unspecified: Secondary | ICD-10-CM | POA: Diagnosis not present

## 2018-11-10 DIAGNOSIS — M7989 Other specified soft tissue disorders: Secondary | ICD-10-CM

## 2018-11-10 DIAGNOSIS — M79672 Pain in left foot: Secondary | ICD-10-CM | POA: Diagnosis not present

## 2018-11-10 DIAGNOSIS — M7122 Synovial cyst of popliteal space [Baker], left knee: Secondary | ICD-10-CM | POA: Diagnosis not present

## 2018-11-10 DIAGNOSIS — R600 Localized edema: Secondary | ICD-10-CM | POA: Diagnosis not present

## 2018-11-10 DIAGNOSIS — Z79890 Hormone replacement therapy (postmenopausal): Secondary | ICD-10-CM | POA: Diagnosis not present

## 2018-11-10 DIAGNOSIS — E063 Autoimmune thyroiditis: Secondary | ICD-10-CM | POA: Diagnosis present

## 2018-11-10 DIAGNOSIS — R0609 Other forms of dyspnea: Secondary | ICD-10-CM | POA: Diagnosis not present

## 2018-11-10 DIAGNOSIS — Z79899 Other long term (current) drug therapy: Secondary | ICD-10-CM | POA: Diagnosis not present

## 2018-11-10 DIAGNOSIS — G4709 Other insomnia: Secondary | ICD-10-CM | POA: Diagnosis not present

## 2018-11-10 DIAGNOSIS — Z20828 Contact with and (suspected) exposure to other viral communicable diseases: Secondary | ICD-10-CM | POA: Diagnosis present

## 2018-11-10 DIAGNOSIS — I10 Essential (primary) hypertension: Secondary | ICD-10-CM | POA: Diagnosis not present

## 2018-11-10 DIAGNOSIS — Z96643 Presence of artificial hip joint, bilateral: Secondary | ICD-10-CM | POA: Diagnosis present

## 2018-11-10 DIAGNOSIS — Z880 Allergy status to penicillin: Secondary | ICD-10-CM | POA: Diagnosis not present

## 2018-11-10 DIAGNOSIS — E669 Obesity, unspecified: Secondary | ICD-10-CM | POA: Diagnosis present

## 2018-11-10 DIAGNOSIS — A6000 Herpesviral infection of urogenital system, unspecified: Secondary | ICD-10-CM | POA: Diagnosis not present

## 2018-11-10 HISTORY — DX: Cellulitis of left lower limb: L03.116

## 2018-11-10 HISTORY — DX: Insomnia, unspecified: G47.00

## 2018-11-10 LAB — CBC
HCT: 41.9 % (ref 39.0–52.0)
HCT: 42.5 % (ref 39.0–52.0)
Hemoglobin: 14.7 g/dL (ref 13.0–17.0)
Hemoglobin: 14.8 g/dL (ref 13.0–17.0)
MCH: 31 pg (ref 26.0–34.0)
MCH: 31.1 pg (ref 26.0–34.0)
MCHC: 34.8 g/dL (ref 30.0–36.0)
MCHC: 35.1 g/dL (ref 30.0–36.0)
MCV: 88.8 fL (ref 80.0–100.0)
MCV: 88.9 fL (ref 80.0–100.0)
Platelets: 267 10*3/uL (ref 150–400)
Platelets: 272 10*3/uL (ref 150–400)
RBC: 4.72 MIL/uL (ref 4.22–5.81)
RBC: 4.78 MIL/uL (ref 4.22–5.81)
RDW: 12.6 % (ref 11.5–15.5)
RDW: 12.6 % (ref 11.5–15.5)
WBC: 7.5 10*3/uL (ref 4.0–10.5)
WBC: 7.9 10*3/uL (ref 4.0–10.5)
nRBC: 0 % (ref 0.0–0.2)
nRBC: 0 % (ref 0.0–0.2)

## 2018-11-10 LAB — BASIC METABOLIC PANEL
Anion gap: 10 (ref 5–15)
BUN: 11 mg/dL (ref 8–23)
CO2: 25 mmol/L (ref 22–32)
Calcium: 9 mg/dL (ref 8.9–10.3)
Chloride: 102 mmol/L (ref 98–111)
Creatinine, Ser: 1 mg/dL (ref 0.61–1.24)
GFR calc Af Amer: >60 mL/min (ref >60)
GFR calc non Af Amer: >60 mL/min (ref >60)
Glucose, Bld: 110 mg/dL — ABNORMAL HIGH (ref 70–99)
Potassium: 3.9 mmol/L (ref 3.5–5.1)
Sodium: 137 mmol/L (ref 135–145)

## 2018-11-10 LAB — SARS CORONAVIRUS 2 (TAT 6-24 HRS): SARS Coronavirus 2: NEGATIVE

## 2018-11-10 MED ORDER — ONDANSETRON HCL 4 MG PO TABS: 4.0000 mg | ORAL_TABLET | Freq: Four times a day (QID) | ORAL | Status: DC | PRN

## 2018-11-10 MED ORDER — TRAZODONE HCL 50 MG PO TABS
50.0000 mg | ORAL_TABLET | Freq: Every evening | ORAL | Status: DC | PRN
  Administered 2018-11-10 – 2018-11-12 (×4): 50 mg via ORAL
  Filled 2018-11-10 (×4): qty 1

## 2018-11-10 MED ORDER — IRBESARTAN 150 MG PO TABS
150.0000 mg | ORAL_TABLET | Freq: Every day | ORAL | Status: DC
  Administered 2018-11-10 – 2018-11-13 (×4): 150 mg via ORAL
  Filled 2018-11-10 (×3): qty 1
  Filled 2018-11-10: qty 0.5
  Filled 2018-11-10: qty 1

## 2018-11-10 MED ORDER — AMLODIPINE BESYLATE 2.5 MG PO TABS
2.5000 mg | ORAL_TABLET | Freq: Every day | ORAL | Status: DC
  Administered 2018-11-10 – 2018-11-13 (×4): 2.5 mg via ORAL
  Filled 2018-11-10 (×5): qty 1

## 2018-11-10 MED ORDER — CALCIUM CARBONATE ANTACID 500 MG PO CHEW: 400.0000 mg | CHEWABLE_TABLET | Freq: Two times a day (BID) | ORAL | Status: DC | PRN

## 2018-11-10 MED ORDER — PANTOPRAZOLE SODIUM 40 MG PO TBEC
40.0000 mg | DELAYED_RELEASE_TABLET | Freq: Every day | ORAL | Status: DC
  Administered 2018-11-10 – 2018-11-13 (×4): 40 mg via ORAL
  Filled 2018-11-10 (×4): qty 1

## 2018-11-10 MED ORDER — VANCOMYCIN HCL 10 G IV SOLR
2000.0000 mg | Freq: Once | INTRAVENOUS | Status: AC
  Administered 2018-11-10: 2000 mg via INTRAVENOUS
  Filled 2018-11-10: qty 2000

## 2018-11-10 MED ORDER — ACETAMINOPHEN 650 MG RE SUPP: 650.0000 mg | Freq: Four times a day (QID) | RECTAL | Status: DC | PRN

## 2018-11-10 MED ORDER — ENOXAPARIN SODIUM 60 MG/0.6ML ~~LOC~~ SOLN
60.0000 mg | SUBCUTANEOUS | Status: DC
  Filled 2018-11-10 (×2): qty 0.6

## 2018-11-10 MED ORDER — METHOCARBAMOL 500 MG PO TABS: 500.0000 mg | ORAL_TABLET | Freq: Three times a day (TID) | ORAL | Status: DC | PRN

## 2018-11-10 MED ORDER — ONDANSETRON HCL 4 MG/2ML IJ SOLN: 4.0000 mg | Freq: Four times a day (QID) | INTRAMUSCULAR | Status: DC | PRN

## 2018-11-10 MED ORDER — CLINDAMYCIN PHOSPHATE 600 MG/50ML IV SOLN
600.0000 mg | Freq: Three times a day (TID) | INTRAVENOUS | Status: DC
  Administered 2018-11-10 (×2): 600 mg via INTRAVENOUS
  Filled 2018-11-10 (×2): qty 50

## 2018-11-10 MED ORDER — GADOBUTROL 1 MMOL/ML IV SOLN
10.0000 mL | Freq: Once | INTRAVENOUS | Status: AC | PRN
  Administered 2018-11-10: 10 mL via INTRAVENOUS

## 2018-11-10 MED ORDER — SODIUM CHLORIDE 0.9 % IV SOLN
1.0000 g | INTRAVENOUS | Status: DC
  Administered 2018-11-10 – 2018-11-13 (×4): 1 g via INTRAVENOUS
  Filled 2018-11-10 (×5): qty 10

## 2018-11-10 MED ORDER — VANCOMYCIN HCL IN DEXTROSE 1-5 GM/200ML-% IV SOLN
1000.0000 mg | Freq: Two times a day (BID) | INTRAVENOUS | Status: DC
  Administered 2018-11-10 – 2018-11-13 (×6): 1000 mg via INTRAVENOUS
  Filled 2018-11-10 (×7): qty 200

## 2018-11-10 MED ORDER — ACETAMINOPHEN 325 MG PO TABS
650.0000 mg | ORAL_TABLET | Freq: Four times a day (QID) | ORAL | Status: DC | PRN
  Administered 2018-11-10 – 2018-11-12 (×6): 650 mg via ORAL
  Filled 2018-11-10 (×7): qty 2

## 2018-11-10 MED ORDER — LORATADINE 10 MG PO TABS
10.0000 mg | ORAL_TABLET | Freq: Every day | ORAL | Status: DC
  Administered 2018-11-10 – 2018-11-13 (×4): 10 mg via ORAL
  Filled 2018-11-10 (×5): qty 1

## 2018-11-10 MED ORDER — TRAMADOL HCL 50 MG PO TABS
50.0000 mg | ORAL_TABLET | Freq: Three times a day (TID) | ORAL | Status: DC | PRN
  Administered 2018-11-10 – 2018-11-12 (×7): 50 mg via ORAL
  Filled 2018-11-10 (×7): qty 1

## 2018-11-10 MED ORDER — POLYETHYLENE GLYCOL 3350 17 G PO PACK: 17.0000 g | PACK | Freq: Every day | ORAL | Status: DC | PRN

## 2018-11-10 MED ORDER — OXYCODONE HCL 5 MG PO TABS: 5.0000 mg | ORAL_TABLET | ORAL | Status: DC | PRN

## 2018-11-10 MED ORDER — LEVOTHYROXINE SODIUM 75 MCG PO TABS
175.0000 ug | ORAL_TABLET | Freq: Every day | ORAL | Status: DC
  Administered 2018-11-10 – 2018-11-13 (×4): 175 ug via ORAL
  Filled 2018-11-10 (×4): qty 1

## 2018-11-10 MED ORDER — ROSUVASTATIN CALCIUM 5 MG PO TABS
5.0000 mg | ORAL_TABLET | Freq: Every day | ORAL | Status: DC
  Administered 2018-11-10 – 2018-11-13 (×4): 5 mg via ORAL
  Filled 2018-11-10 (×4): qty 1

## 2018-11-10 NOTE — ED Notes (Signed)
Lunch tray ordered 

## 2018-11-10 NOTE — ED Notes (Signed)
Ordered bfast 

## 2018-11-10 NOTE — ED Notes (Signed)
Pt returned from [employer]

## 2018-11-10 NOTE — H&P (Signed)
History and Physical    Jonathan Powers [id number] DOB: 1950/07/15 DOA: 11/09/2018  PCP: [name], MD  Patient coming from: Home  Chief Complaint: Painful rash  HPI: Jonathan Powers is a 68 y.o. male with medical history significant of hypertension, hyperlipidemia, chronic back pain, hypothyroidism, and reflux.  Over the past 4 weeks, the patient has noticed a small red area on his left lower extremity continue to grow.  He had a virtual visit with a provider and was placed on 10 days of Keflex.  He noticed very mild improvement and then subsequent worsening.  He saw his PCP 3 days ago and was placed on doxycycline.  Since the start of that, he has not noticed any improvement.  He is not having any drainage or growths over the area.  He is having spreading of the redness and increased pain.  He denies any fevers or inciting event.  He did have surgery on his left ankle in May 2019.  No recent prolonged bedrest, injury, surgeries; no personal or family history of clotting disorders.  ED Course: Labs drawn and unremarkable.  COVID as ordered and pending upon admission.  ED team also ordered a lower extremity ultrasound due to concern for clot.  Review of Systems: As per HPI otherwise 10 point review of systems negative.   Past Medical History:  Diagnosis Date  . ALLERGIC RHINITIS   . CELLULITIS, LEG, RIGHT    Recurrent R hip cellulitis 1/08,3/08   . Diverticulitis   . GERD (gastroesophageal reflux disease)   . Gout   . Hashimoto's thyroiditis   . Hypertension   . Hypogonadism male    low T, a/w ED  . Hypothyroid   . Left thyroid nodule 2008   on [place], decrease size in 2011 [place]  . Migraines    Atypical and ocular  . OSTEOARTHRITIS   . Seasonal allergies     Past Surgical History:  Procedure Laterality Date  . CARPAL TUNNEL RELEASE Left 03/30/2017   Procedure: CARPAL TUNNEL RELEASE;  Surgeon: Garald Balding, MD;  Location: [facility];  Service:  Orthopedics;  Laterality: Left;  . CHOLECYSTECTOMY  2004  . CTR Bilateral 1982  . CYSTOSCOPY  1974  . FOOT FUSION Left 06/22/2017  . HEMORRHOID SURGERY N/A 03/30/2017   Procedure: HEMORRHOIDECTOMY;  Surgeon: Coralie Keens, MD;  Location: [facility];  Service: General;  Laterality: N/A;  . open reduction L little finger Left 1970  . Repair digital thumb, left Left 1990  . Right shoulder cuff repair Right 09/15/11  . Right shoulder SAD, DCR Right 02/05/11  . TOTAL HIP ARTHROPLASTY Left 07/26/06  . TOTAL HIP ARTHROPLASTY Right 1999     reports that he has never smoked. He has never used smokeless tobacco. He reports current alcohol use. He reports that he does not use drugs.  Allergies  Allergen Reactions  . Nsaids Shortness Of Breath    Naproxen specifically causes SOB/wheeze tolerates topical diclofenac without problem Tylenol OK  . Tolmetin Shortness Of Breath    Naproxen specifically causes SOB/wheeze tolerates topical diclofenac without problem Tylenol OK  . Penicillins Rash    Family History  Problem Relation Age of Onset  . Arthritis Father   . Heart disease Father   . Diabetes Father   . Vascular Disease Father        AoBifem  . Arthritis Mother   . Diabetes Mother   . Multiple sclerosis Mother   .  Cancer Paternal Grandmother        "GI"  . Cancer Paternal Grandfather        stomach cancer  . Thyroid cancer Sister   . Uterine cancer Sister     Prior to Admission medications   Medication Sig Start Date End Date Taking? Authorizing Provider  acetaminophen (TYLENOL) 500 MG tablet Take 1,000 mg by mouth 2 (two) times daily.   Yes [provider]  amLODipine (NORVASC) 2.5 MG tablet Take 1 tablet (2.5 mg total) by mouth daily. 10/10/18 01/08/19 Yes Adrian Prows, MD  cetirizine (ZYRTEC) 10 MG tablet Take 10 mg by mouth daily.   Yes [provider]  Cholecalciferol (VITAMIN D-3) 5000 UNITS TABS Take 5,000 Units by mouth daily.    Yes  [provider]  doxycycline (VIBRAMYCIN) 100 MG capsule 1 tablet PO BID X 14 days Patient taking differently: Take 100 mg by mouth 2 (two) times daily.  11/07/18  Yes Garald Balding, MD  esomeprazole (NEXIUM) 20 MG capsule Take 20 mg by mouth daily as needed (for acid reflux).   Yes [provider]  levothyroxine (SYNTHROID) 175 MCG tablet Take 1 tablet (175 mcg total) by mouth daily before breakfast. 09/23/18  Yes [name], MD  methocarbamol (ROBAXIN) 500 MG tablet TAKE ONE TABLET BY MOUTH DAILY AT BEDTIME  Patient taking differently: Take 500 mg by mouth every 8 (eight) hours as needed for muscle spasms.  09/30/18  Yes Garald Balding, MD  rosuvastatin (CRESTOR) 5 MG tablet TAKE ONE TABLET BY MOUTH ONE TIME DAILY  Patient taking differently: Take 5 mg by mouth at bedtime.  07/12/18  Yes [name], MD  telmisartan (MICARDIS) 40 MG tablet TAKE ONE TABLET BY MOUTH ONE TIME DAILY  Patient taking differently: Take 40 mg by mouth daily.  11/06/18  Yes [name], MD  traMADol (ULTRAM) 50 MG tablet Take 1 tablet (50 mg total) by mouth 3 (three) times daily as needed. Patient taking differently: Take 50 mg by mouth at bedtime.  11/06/18  Yes Ofilia Neas, PA-C  traZODone (DESYREL) 100 MG tablet Take 0.5-1 tablets (50-100 mg total) by mouth at bedtime as needed for sleep. Patient taking differently: Take 50 mg by mouth at bedtime.  08/10/18  Yes [name], MD  allopurinol (ZYLOPRIM) 100 MG tablet Take 1 tablet (100 mg total) by mouth daily for 7 days, THEN 2 tablets (200 mg total) daily for 7 days, THEN 3 tablets (300 mg total) daily for 14 days. Patient not taking: Reported on 11/09/2018 07/10/18 11/09/27  Bo Merino, MD  diclofenac sodium (VOLTAREN) 1 % GEL 2-4 grams qid as needed for pain as discussed in office Patient not taking: Reported on 11/09/2018 07/22/18   Ofilia Neas, PA-C  fluocinonide-emollient (LIDEX-E) 0.05 % cream Apply 1 application  topically 2 (two) times daily. Patient not taking: Reported on 11/09/2018 07/24/17   [name], MD  hydrocortisone-pramoxine (PROCTOFOAM [facility]) rectal foam Place 1 applicator rectally 2 (two) times daily. Patient not taking: Reported on 11/09/2018 04/05/18   Marrian Salvage, FNP  ondansetron (ZOFRAN-ODT) 8 MG disintegrating tablet Take 1 tablet (8 mg total) by mouth every 8 (eight) hours as needed for nausea or vomiting. Patient not taking: Reported on 11/09/2018 04/05/18   Marrian Salvage, FNP  Teriparatide, Recombinant, 600 MCG/2.4ML SOLN Inject 0.08 mLs (20 mcg total) into the skin daily. Patient not taking: Reported on 11/09/2018 03/11/18   [name], MD  valACYclovir (VALTREX) 1000 MG tablet Take 1 tablet (1,000 mg total) by mouth 2 (two) times daily. Patient not taking: Reported on 11/09/2018 09/23/18   [name], MD    Physical Exam: Vitals:   11/09/18 2042 11/09/18 2058  BP: (!) 144/77   Pulse: 69   Resp: (!) 23   Temp: 98.9 F (37.2 C)   TempSrc: Oral   SpO2: 100%   Weight:  127.9 kg  Height:  5\' 10"  (1.778 m)    Constitutional: NAD, calm, comfortable Eyes: PERRL, lids and conjunctivae normal ENMT: Mucous membranes are moist. Posterior pharynx clear of any exudate or lesions.Normal dentition.  Neck: normal, supple, no masses, no thyromegaly Respiratory: clear to auscultation bilaterally, no wheezing, no crackles. Normal respiratory effort. No accessory muscle use.  Cardiovascular: RRR. No extremity edema other than left lower extremity. 2+ pedal pulses. No carotid bruits.  Abdomen: no tenderness, no masses palpated. No hepatosplenomegaly. Bowel sounds positive.  Musculoskeletal: no clubbing / cyanosis. No joint deformity upper and lower extremities. Good ROM, no contractures. Normal muscle tone.  Skin: + Erythema over the left distal lower extremity encompassing the entire circumference and going up the calf; there is tenderness to palpation and associated  pitting edema; there is no fluctuance or drainage, there are no fluid-filled lesions; there is excessive warmth to the touch Neurologic: CN 2-12 grossly intact. Sensation intact, DTR normal. Strength 5/5 in all 4.  Psychiatric: Normal judgment and insight. Alert and oriented x 3. Normal mood.   Labs on Admission: I have personally reviewed following labs and imaging studies  CBC: Recent Labs  Lab 11/09/18 2104  WBC 9.1  HGB 15.2  HCT 45.5  MCV 90.3  PLT [id number]   Basic Metabolic Panel: Recent Labs  Lab 11/07/18 1632 11/09/18 2104  NA 140 135  K 4.5 3.7  CL 100 100  CO2 27 22  GLUCOSE 98 138*  BUN 13 11  CREATININE 1.03 1.23  CALCIUM 9.4 9.2   Liver Function Tests: Recent Labs  Lab 11/09/18 2104  AST 24  ALT 20  ALKPHOS 53  BILITOT 0.9  PROT 7.2  ALBUMIN 4.0   Cardiac Enzymes: Recent Labs  Lab 11/09/18 2104  CKTOTAL 152   HbA1C: Recent Labs    11/09/18 2104  HGBA1C 5.1   EKG: Independently reviewed.   Assessment/Plan Principal Problem:   Cellulitis of left lower extremity  Stop outpatient doxycycline, start IV clindamycin 600 mg every 8 hours  Blood cultures pending  Monitor CBC and vital signs, stable upon admission  Oxycodone for breakthrough pain, Tylenol for mild pain Active Problems:   Essential hypertension  Continue Norvasc, change telmisartan to Avapro   Dyslipidemia, goal LDL below 130  Continue Crestor   Hypothyroid  Continue Synthroid   Chronic right-sided low back pain without sciatica  Continue home tramadol and Robaxin   Seasonal allergies  Substitute Claritin for Zyrtec   GERD  Substitute Protonix for Nexium   Insomnia  Continue trazodone  DVT prophylaxis: Lovenox Code Status: Full Family Communication: Self Disposition Plan: Home Consults called: None Admission status: Observation  Severity of Illness: The appropriate patient status for this patient is OBSERVATION. Observation status is judged to be reasonable and  necessary in order to provide the required intensity of service to ensure the patient's safety. The patient's presenting symptoms, physical exam findings, and initial radiographic and laboratory data in the context of their medical condition is felt to place them at decreased risk for further clinical deterioration.  Furthermore, it is anticipated that the patient will be medically stable for discharge from the hospital within 2 midnights of admission. The following factors support the patient status of observation.   " The patient's presenting symptoms include painful rash. " The physical exam findings include redness, swelling, ttp. " The initial radiographic and laboratory data are unremarkable.    Shelda Pal, DO [facility] [email or web address] 11/10/2018, 12:13 AM

## 2018-11-10 NOTE — Progress Notes (Signed)
   Follow Up Note  Please refer to full H&P done earlier this morning for full details Briefly, 68 year old male with history significant for hypertension, hyperlipidemia, chronic back pain, hypothyroidism, presents to the ED [facility] outpatient oral antibiotics for left lower extremity cellulitis.  Of note, patient has a history of left subtalar and talonavicular joint arthrodesis in May 2019.  Patient admitted for further management.  Patient is an orthopedic PA  Exam:  General: NAD   Cardiovascular: S1, S2 present  Respiratory: CTAB  Abdomen: Soft, nontender, nondistended, bowel sounds present  Musculoskeletal: 1+ bilateral pedal edema noted  Skin:  Left lower extremity erythema, swelling, some tenderness  Psychiatry: Normal mood   Present on Admission: . Hypothyroid . Essential hypertension . Dyslipidemia, goal LDL below 130 . Cellulitis of left lower extremity . Cellulitis . Chronic right-sided low back pain without sciatica . Insomnia . GERD (gastroesophageal reflux disease) . Seasonal allergies   Assessment/plan Left lower extremity cellulitis Currently afebrile, with no leukocytosis Venous Doppler bilaterally negative for DVT X-ray of left foot showed diffuse soft tissue edema around the foot and ankle, consider MRI to rule out osteomyelitis MRI ordered, pending Continue IV vancomycin, ceftriaxone Monitor closely  Work-up for CAD Patient reports intermittent chest pain, currently none now Follows with Dr. Adrian Prows, who recommended a CT coronary and echo Consider contacting  Dr. Einar Gip in am, to follow-up on patient, and see if this work-up can be done while patient is in the hospital Added Dr. Einar Gip to the treatment team  Hypertension Continue Norvasc, Avapro  Hyperlipidemia Continue Crestor  Hypothyroidism Continue Synthroid  Chronic low back pain Continue home regimen  GERD Continue PPI  Insomnia Continue trazodone

## 2018-11-10 NOTE — ED Notes (Signed)
Mri ordered   Will be coming for him

## 2018-11-10 NOTE — ED Notes (Signed)
Report called to dorothy on 5n  Will take pt up when he returns from [facility]

## 2018-11-10 NOTE — Progress Notes (Signed)
Patient ID: Jonathan Powers, male   DOB: Mar 09, 1950, 68 y.o.   MRN: [id number] [name] asked me personally to review the plain films and MRI involving his left lower extremity.  This is absolutely appropriate since I am on orthopedic call and he does have a history of a previous left subtalar fusion.  He has been dealing with cellulitis recently of the left lower extremity and had been treated with oral antibiotics per his primary orthopedic surgeon from [place].  The cellulitis did worsen yesterday and I spoke with [name] by phone and we both felt that he needed to go to the emergency room.  He was then subsequently admitted to the hospitalist service and started on IV antibiotics.  I did see him earlier this morning in the emergency room to assess his leg.  I have reviewed the plain films and the MRI of his left lower extremity.  There does not appear to be any evidence of osteomyelitis and this appears to be more of just a cellulitis involving the soft tissues.  There is no evidence of an abscess.  Earlier today we did put a compressive sock on his left lower extremity to hopefully help with venous return and to decrease the edema in the left lower extremity.

## 2018-11-10 NOTE — Progress Notes (Signed)
Pharmacy Antibiotic Note  Jonathan Powers is a 68 y.o. male admitted on 11/09/2018 with cellulitis.  Pharmacy has been consulted for vancomycin dosing. Pt is afebrile and WBC is WNL. Scr is WNL at 1 and lactic acid is <1. Pt was on recently on cephalexin then doxycycline.   Plan: Vancomycin 2gm IV x 1 then 1gm IV Q12H F/u renal fxn, C&S, clinical status and peak/trough at SS  Height: 5\' 10"  (177.8 cm) Weight: 282 lb (127.9 kg) IBW/kg (Calculated) : 73  Temp (24hrs), Avg:98.3 F (36.8 C), Min:97.6 F (36.4 C), Max:98.9 F (37.2 C)  Recent Labs  Lab 11/07/18 1632 11/09/18 2104 11/09/18 2105 11/10/18 0038 11/10/18 0321  WBC  --  9.1  --  7.9 7.5  CREATININE 1.03 1.23  --   --  1.00  LATICACIDVEN  --   --  1.7  --   --     Estimated Creatinine Clearance: 96.3 mL/min (by C-G formula based on SCr of 1 mg/dL).    Allergies  Allergen Reactions  . Nsaids Shortness Of Breath    Naproxen specifically causes SOB/wheeze tolerates topical diclofenac without problem Tylenol OK  . Tolmetin Shortness Of Breath    Naproxen specifically causes SOB/wheeze tolerates topical diclofenac without problem Tylenol OK  . Penicillins Rash    Antimicrobials this admission: Vanc 9/20>> CTX 9/20>> Clinda x 1 9/20  Dose adjustments this admission: N/A  Microbiology results: Pending  Thank you for allowing pharmacy to be a part of this patient's care.  Shermaine Rivet, Rande Lawman 11/10/2018 8:54 AM

## 2018-11-10 NOTE — Progress Notes (Signed)
VASCULAR LAB PRELIMINARY  PRELIMINARY  PRELIMINARY  PRELIMINARY  Left lower extremity venous duplex completed.    Preliminary report:  See CV proc for preliminary results.   Fionna Merriott, RVT 11/10/2018, 7:28 AM

## 2018-11-10 NOTE — ED Notes (Signed)
Pt to mri 

## 2018-11-11 ENCOUNTER — Other Ambulatory Visit: Payer: Medicare Other

## 2018-11-11 ENCOUNTER — Ambulatory Visit (HOSPITAL_COMMUNITY)
Admission: RE | Admit: 2018-11-11 | Discharge: 2018-11-11 | Disposition: A | Discharge status: Home / Self Care | Payer: Medicare Other | Source: Ambulatory Visit | Attending: Cardiology | Admitting: Cardiology

## 2018-11-11 DIAGNOSIS — E039 Hypothyroidism, unspecified: Secondary | ICD-10-CM

## 2018-11-11 DIAGNOSIS — R072 Precordial pain: Secondary | ICD-10-CM | POA: Diagnosis not present

## 2018-11-11 DIAGNOSIS — E7800 Pure hypercholesterolemia, unspecified: Secondary | ICD-10-CM

## 2018-11-11 DIAGNOSIS — G4709 Other insomnia: Secondary | ICD-10-CM

## 2018-11-11 DIAGNOSIS — L03116 Cellulitis of left lower limb: Secondary | ICD-10-CM | POA: Diagnosis present

## 2018-11-11 DIAGNOSIS — R0600 Dyspnea, unspecified: Secondary | ICD-10-CM

## 2018-11-11 DIAGNOSIS — E785 Hyperlipidemia, unspecified: Secondary | ICD-10-CM

## 2018-11-11 DIAGNOSIS — E038 Other specified hypothyroidism: Secondary | ICD-10-CM | POA: Diagnosis present

## 2018-11-11 DIAGNOSIS — M545 Low back pain: Secondary | ICD-10-CM

## 2018-11-11 DIAGNOSIS — R0609 Other forms of dyspnea: Secondary | ICD-10-CM | POA: Diagnosis not present

## 2018-11-11 DIAGNOSIS — G8929 Other chronic pain: Secondary | ICD-10-CM

## 2018-11-11 DIAGNOSIS — M79672 Pain in left foot: Secondary | ICD-10-CM

## 2018-11-11 DIAGNOSIS — I10 Essential (primary) hypertension: Secondary | ICD-10-CM

## 2018-11-11 HISTORY — DX: Hypothyroidism, unspecified: E03.9

## 2018-11-11 LAB — CBC WITH DIFFERENTIAL/PLATELET
Abs Immature Granulocytes: 0.02 10*3/uL (ref 0.00–0.07)
Basophils Absolute: 0.1 10*3/uL (ref 0.0–0.1)
Basophils Relative: 1 %
Eosinophils Absolute: 0.4 10*3/uL (ref 0.0–0.5)
Eosinophils Relative: 5 %
HCT: 43.8 % (ref 39.0–52.0)
Hemoglobin: 14.4 g/dL (ref 13.0–17.0)
Immature Granulocytes: 0 %
Lymphocytes Relative: 30 %
Lymphs Abs: 2.1 10*3/uL (ref 0.7–4.0)
MCH: 29.8 pg (ref 26.0–34.0)
MCHC: 32.9 g/dL (ref 30.0–36.0)
MCV: 90.7 fL (ref 80.0–100.0)
Monocytes Absolute: 0.7 10*3/uL (ref 0.1–1.0)
Monocytes Relative: 10 %
Neutro Abs: 3.8 10*3/uL (ref 1.7–7.7)
Neutrophils Relative %: 54 %
Platelets: 289 10*3/uL (ref 150–400)
RBC: 4.83 MIL/uL (ref 4.22–5.81)
RDW: 12.7 % (ref 11.5–15.5)
WBC: 7.1 10*3/uL (ref 4.0–10.5)
nRBC: 0 % (ref 0.0–0.2)

## 2018-11-11 LAB — BASIC METABOLIC PANEL
Anion gap: 8 (ref 5–15)
BUN: 10 mg/dL (ref 8–23)
CO2: 27 mmol/L (ref 22–32)
Calcium: 9.2 mg/dL (ref 8.9–10.3)
Chloride: 104 mmol/L (ref 98–111)
Creatinine, Ser: 1.04 mg/dL (ref 0.61–1.24)
GFR calc Af Amer: >60 mL/min (ref >60)
GFR calc non Af Amer: >60 mL/min (ref >60)
Glucose, Bld: 99 mg/dL (ref 70–99)
Potassium: 4.1 mmol/L (ref 3.5–5.1)
Sodium: 139 mmol/L (ref 135–145)

## 2018-11-11 MED ORDER — IOHEXOL 350 MG/ML SOLN
100.0000 mL | Freq: Once | INTRAVENOUS | Status: AC | PRN
  Administered 2018-11-11: 100 mL via INTRAVENOUS

## 2018-11-11 MED ORDER — NITROGLYCERIN 0.4 MG SL SUBL
0.8000 mg | SUBLINGUAL_TABLET | Freq: Once | SUBLINGUAL | Status: AC
  Administered 2018-11-11: 0.8 mg via SUBLINGUAL

## 2018-11-11 MED ORDER — NITROGLYCERIN 0.4 MG SL SUBL
SUBLINGUAL_TABLET | SUBLINGUAL | Status: AC
  Filled 2018-11-11: qty 2

## 2018-11-11 NOTE — Progress Notes (Signed)
Pt refused being put back on cardiac monitoring. Pt states reason is because it put a red mark on chest and removed hair.  Patient educated. MD notified.

## 2018-11-11 NOTE — Progress Notes (Signed)
Patient to CT via wheelchair.

## 2018-11-11 NOTE — Progress Notes (Signed)
PROGRESS NOTE    Jonathan Powers  [id number] DOB: 04-12-1950 DOA: 11/09/2018 PCP: [name], MD   Brief Narrative:  68 y.o. WM (orthopedic surgery PA) PMHx HTN, HLD, chronic back pain, Hashimoto's thyroiditis, hypothyroidism, hypogonadism, diverticulitis, cellulitis RIGHT leg, and reflux.  Over the past 4 weeks, the patient has noticed a small red area on his LEFT lower extremity continue to grow.  He had a virtual visit with a provider and was placed on 10 days of Keflex.  He noticed very mild improvement and then subsequent worsening.  He saw his PCP 3 days ago and was placed on doxycycline.  Since the start of that, he has not noticed any improvement.  He is not having any drainage or growths over the area.  He is having spreading of the redness and increased pain.  He denies any fevers or inciting event.  He did have surgery on his left ankle in May 2019.  No recent prolonged bedrest, injury, surgeries; no personal or family history of clotting disorders.  ED Course: Labs drawn and unremarkable.  COVID as ordered and pending upon admission.  ED team also ordered a lower extremity ultrasound due to concern for clot.   Subjective: 9/21 A/O x4, negative CP, negative S OB, negative abdominal pain, negative N/V.  Positive L LE pain, improving.   Assessment & Plan:   Principal Problem:   Cellulitis of left lower extremity Active Problems:   Essential hypertension   Dyslipidemia, goal LDL below 130   Hypothyroid   Chronic right-sided low back pain without sciatica   Cellulitis   Insomnia   GERD (gastroesophageal reflux disease)   Seasonal allergies  Cellulitis LLE - Patient received course of Keflex + 1/2 course of doxycycline as outpatient without resolution of cellulitis and previously injured LEFT ankle. - Patient's left ankle and shin erythematous, warm to touch painful to palpation but improving.  Given this is a severely injured ankle that was repaired would continue  parenteral antibiotics at least 48 more hours, then consider switching to Bactrim to complete 7-day course. -Venous Doppler bilateral negative for DVT see results below -Patient concurs with treatment plan.  Essential HTN - Amlodipine 2.5 mg daily - Irbesartan 150 mg daily  CAD work-up - Dr. Adrian Prows cardiologist has ordered CT coronary and echocardiogram.  Pending  HLD - Crestor 5 mg daily  Hypothyroidism - Synthroid 175 mcg daily  Chronic right sided low back pain without sciatica - Tramadol and Robaxin  GERD - Protonix 40 mg daily  Insomnia - Trazodone 50-100 mg PRN    DVT prophylaxis: Frequent ambulation Code Status: Full Family Communication: None Disposition Plan: 48 to 72 hours   Consultants:  Orthopedic surgery    Procedures/Significant Events:  9/20 LEFT foot x-ray;Osteopenia. No fracture or dislocation of the left foot. There is extensive subtalar and talonavicular plate and screw fusion of the hindfoot. Diffuse soft tissue edema about the foot and ankle. Consider MRI to further evaluate for marrow edema and osteomyelitis if clinically suspected. 9/21 MRI LEFT tibia fibula W/W0 contrast:Findings suggestive of cellulitis involving the mid to lower leg. No soft tissue abscess or other acute abnormality.  No evidence of osteomyelitis.  Lateral patellofemoral compartment chondral disease.  Loculated popliteal cyst without evidence of rupture   I have personally reviewed and interpreted all radiology studies and my findings are as above.  VENTILATOR SETTINGS:    Cultures 9/19 blood LEFT wrist NGTD 9/19 blood RIGHT wrist NGTD 9/20 SARS coronavirus negative  Antimicrobials: Anti-infectives (From admission, onward)   Start     Stop   11/10/18 2200  vancomycin (VANCOCIN) IVPB 1000 mg/200 mL premix         11/10/18 0900  cefTRIAXone (ROCEPHIN) 1 g in sodium chloride 0.9 % 100 mL IVPB         11/10/18 0845  vancomycin (VANCOCIN) 2,000 mg  in sodium chloride 0.9 % 500 mL IVPB     11/10/18 1250   11/10/18 0015  clindamycin (CLEOCIN) IVPB 600 mg  Status:  Discontinued     11/10/18 [id number]       Devices    LINES / TUBES:      Continuous Infusions:  cefTRIAXone (ROCEPHIN)  IV Stopped (11/10/18 0954)   vancomycin 1,000 mg (11/10/18 2304)     Objective: Vitals:   11/10/18 1002 11/10/18 2043 11/11/18 0500 11/11/18 0759  BP: 133/77 135/78 128/66 (!) 145/83  Pulse: 62 (!) 59 (!) 49 (!) 52  Resp: 18 18 20 18   Temp:  98.3 F (36.8 C) 97.7 F (36.5 C) 98.5 F (36.9 C)  TempSrc:  Oral Oral Oral  SpO2: 96% 96% 97% 97%  Weight:      Height:        Intake/Output Summary (Last 24 hours) at 11/11/2018 0801 Last data filed at 11/11/2018 0600 Gross per 24 hour  Intake 800 ml  Output --  Net 800 ml   Filed Weights   11/09/18 2058  Weight: 127.9 kg    Examination:  General: A/O x4 no acute respiratory distress Eyes: negative scleral hemorrhage, negative anisocoria, negative icterus ENT: Negative Runny nose, negative gingival bleeding, Neck:  Negative scars, masses, torticollis, lymphadenopathy, JVD Lungs: Clear to auscultation bilaterally without wheezes or crackles Cardiovascular: Regular rate and rhythm without murmur gallop or rub normal S1 and S2 Abdomen: negative abdominal pain, nondistended, positive soft, bowel sounds, no rebound, no ascites, no appreciable mass Extremities: LEFT ankle with well-healed surgical scars consistent with ligamentous/tendon repairs.  Positive swelling of foot cephalad to mid shin.  Positive tender to palpation.  Positive erythematous.  Positive warm to touch  Skin: Negative rashes, lesions, ulcers Psychiatric:  Negative depression, negative anxiety, negative fatigue, negative mania  Central nervous system:  Cranial nerves II through XII intact, tongue/uvula midline, all extremities muscle strength 5/5, sensation intact throughout,  negative dysarthria, negative expressive aphasia,  negative receptive aphasia.  .     Data Reviewed: Care during the described time interval was provided by me .  I have reviewed this patient's available data, including medical history, events of note, physical examination, and all test results as part of my evaluation.   CBC: Recent Labs  Lab 11/09/18 2104 11/10/18 0038 11/10/18 0321 11/11/18 0539  WBC 9.1 7.9 7.5 7.1  NEUTROABS  --   --   --  3.8  HGB 15.2 14.8 14.7 14.4  HCT 45.5 42.5 41.9 43.8  MCV 90.3 88.9 88.8 90.7  PLT 334 272 267 [id number]   Basic Metabolic Panel: Recent Labs  Lab 11/07/18 1632 11/09/18 2104 11/10/18 0321 11/11/18 0539  NA 140 135 137 139  K 4.5 3.7 3.9 4.1  CL 100 100 102 104  CO2 27 22 25 27   GLUCOSE 98 138* 110* 99  BUN 13 11 11 10   CREATININE 1.03 1.23 1.00 1.04  CALCIUM 9.4 9.2 9.0 9.2   GFR: Estimated Creatinine Clearance: 92.6 mL/min (by C-G formula based on SCr of 1.04 mg/dL). Liver Function Tests: Recent Labs  Lab 11/09/18 2104  AST 24  ALT 20  ALKPHOS 53  BILITOT 0.9  PROT 7.2  ALBUMIN 4.0   No results for input(s): LIPASE, AMYLASE in the last 168 hours. No results for input(s): AMMONIA in the last 168 hours. Coagulation Profile: No results for input(s): INR, PROTIME in the last 168 hours. Cardiac Enzymes: Recent Labs  Lab 11/09/18 2104  CKTOTAL 152   BNP (last 3 results) No results for input(s): PROBNP in the last 8760 hours. HbA1C: Recent Labs    11/09/18 2104  HGBA1C 5.1   CBG: No results for input(s): GLUCAP in the last 168 hours. Lipid Profile: No results for input(s): CHOL, HDL, LDLCALC, TRIG, CHOLHDL, LDLDIRECT in the last 72 hours. Thyroid Function Tests: No results for input(s): TSH, T4TOTAL, FREET4, T3FREE, THYROIDAB in the last 72 hours. Anemia Panel: No results for input(s): VITAMINB12, FOLATE, FERRITIN, TIBC, IRON, RETICCTPCT in the last 72 hours. Urine analysis:    Component Value Date/Time   COLORURINE YELLOW 02/05/2018 1629   APPEARANCEUR  CLEAR 02/05/2018 1629   LABSPEC 1.025 02/05/2018 1629   PHURINE 5.5 02/05/2018 1629   GLUCOSEU NEGATIVE 02/05/2018 1629   HGBUR NEGATIVE 02/05/2018 1629   BILIRUBINUR NEGATIVE 02/05/2018 1629   KETONESUR NEGATIVE 02/05/2018 1629   PROTEINUR NEGATIVE 09/12/2016 1708   UROBILINOGEN 0.2 02/05/2018 1629   NITRITE NEGATIVE 02/05/2018 1629   LEUKOCYTESUR NEGATIVE 02/05/2018 1629   Sepsis Labs: @LABRCNTIP (procalcitonin:4,lacticidven:4)  ) Recent Results (from the past 240 hour(s))  SARS CORONAVIRUS 2 (TAT 6-24 HRS) Nasopharyngeal Nasopharyngeal Swab     Status: None   Collection Time: 11/10/18 12:08 AM   Specimen: Nasopharyngeal Swab  Result Value Ref Range Status   SARS Coronavirus 2 NEGATIVE NEGATIVE Final    Comment: (NOTE) SARS-CoV-2 target nucleic acids are NOT DETECTED. The SARS-CoV-2 RNA is generally detectable in upper and lower respiratory specimens during the acute phase of infection. Negative results do not preclude SARS-CoV-2 infection, do not rule out co-infections with other pathogens, and should not be used as the sole basis for treatment or other patient management decisions. Negative results must be combined with clinical observations, patient history, and epidemiological information. The expected result is Negative. Fact Sheet for Patients: SugarRoll.be Fact Sheet for Healthcare Providers: https://www.-mathews.com/ This test is not yet approved or cleared by the [place] FDA and  has been authorized for detection and/or diagnosis of SARS-CoV-2 by FDA under an Emergency Use Authorization (EUA). This EUA will remain  in effect (meaning this test can be used) for the duration of the COVID-19 declaration under Section 56 4(b)(1) of the Act, 21 U.S.C. section 360bbb-3(b)(1), unless the authorization is terminated or revoked sooner. Performed at [facility], [place] [address], [place], [place] [address]           Radiology Studies: Mr Tibia Fibula Left W Wo Contrast  Result Date: 11/10/2018 CLINICAL DATA:  Ankle swelling, cellulitis pain EXAM: MRI OF LOWER LEFT EXTREMITY WITHOUT AND WITH CONTRAST TECHNIQUE: Multiplanar, multisequence MR imaging of the left lower extremity was performed both before and after administration of intravenous contrast. CONTRAST:  75mL GADAVIST GADOBUTROL 1 MMOL/ML IV SOLN COMPARISON:  None. FINDINGS: Bones/Joint/Cartilage No fracture or avascular necrosis. There is subchondral marrow signal changes seen at the lateral tibial plateau and patellofemoral compartment, from osteoarthritis. No pathologic marrow infiltration. No evidence of abnormal enhancement. No large knee joint effusion is seen. There is a loculated popliteal cyst seen measuring 3 cm. No evidence of cyst rupture. Ligaments The ACL and PCL are intact.  The ligaments of the ankle are suboptimally visualized. Muscles and Tendons There is mild fatty atrophy seen within the muscles of the lower extremity. The muscles are otherwise intact. No focal muscular tear or areas focal enhancement. The tendons are intact without evidence of tendinopathy or tear. Soft tissues There is mildly enhancing diffuse skin thickening and subcutaneous edema seen surrounding the mid to lower leg. No loculated fluid collections are seen within the soft tissues. No enhancing soft tissue mass. IMPRESSION: Findings suggestive of cellulitis involving the mid to lower leg. No soft tissue abscess or other acute abnormality. No evidence of osteomyelitis. Lateral patellofemoral compartment chondral disease. Loculated popliteal cyst without evidence of rupture Electronically Signed   By: Prudencio Pair M.D.   On: 11/10/2018 19:01   Dg Foot Complete Left  Result Date: 11/10/2018 CLINICAL DATA:  Cellulitis EXAM: LEFT FOOT - COMPLETE 3+ VIEW COMPARISON:  None. FINDINGS: Osteopenia. No fracture or dislocation of the left foot. There is extensive subtalar and  talonavicular plate and screw fusion of the hindfoot. Diffuse soft tissue edema about the foot and ankle. IMPRESSION: Osteopenia. No fracture or dislocation of the left foot. There is extensive subtalar and talonavicular plate and screw fusion of the hindfoot. Diffuse soft tissue edema about the foot and ankle. Consider MRI to further evaluate for marrow edema and osteomyelitis if clinically suspected. Electronically Signed   By: Eddie Candle M.D.   On: 11/10/2018 13:34   Vas [place] Lower Extremity Venous (dvt) (only Mc & Wl 7a-7p)  Result Date: 11/10/2018  Lower Venous Study Indications: Swelling, Erythema, and cellulitis.  Risk Factors: Known left Baker's cyst. Comparison Study: Prior study from 03/06/17 is available for comparison Performing Technologist: Sharion Dove RVS  Examination Guidelines: A complete evaluation includes B-mode imaging, spectral Doppler, color Doppler, and power Doppler as needed of all accessible portions of each vessel. Bilateral testing is considered an integral part of a complete examination. Limited examinations for reoccurring indications may be performed as noted.  +-----+---------------+---------+-----------+----------+--------------+  RIGHT Compressibility Phasicity Spontaneity Properties Thrombus Aging  +-----+---------------+---------+-----------+----------+--------------+  CFV   Full            Yes       Yes                                    +-----+---------------+---------+-----------+----------+--------------+   +---------+---------------+---------+-----------+----------+--------------+  LEFT      Compressibility Phasicity Spontaneity Properties Thrombus Aging  +---------+---------------+---------+-----------+----------+--------------+  CFV       Full            Yes       Yes                                    +---------+---------------+---------+-----------+----------+--------------+  SFJ       Full                                                              +---------+---------------+---------+-----------+----------+--------------+  FV Prox   Full                                                             +---------+---------------+---------+-----------+----------+--------------+  FV Mid    Full                                                             +---------+---------------+---------+-----------+----------+--------------+  FV Distal Full                                                             +---------+---------------+---------+-----------+----------+--------------+  PFV       Full                                                             +---------+---------------+---------+-----------+----------+--------------+  POP       Full            Yes       Yes                                    +---------+---------------+---------+-----------+----------+--------------+  PTV       Full                                                             +---------+---------------+---------+-----------+----------+--------------+  PERO      Full                                                             +---------+---------------+---------+-----------+----------+--------------+     Summary: Right: No evidence of common femoral vein obstruction. Left: There is no evidence of deep vein thrombosis in the lower extremity. A cystic structure is found in the popliteal fossa.  *See table(s) above for measurements and observations. Electronically signed by Ruta Hinds MD on 11/10/2018 at 10:59:20 AM.    Final         Scheduled Meds:  amLODipine  2.5 mg Oral Daily   enoxaparin (LOVENOX) injection  60 mg Subcutaneous Q24H   irbesartan  150 mg Oral Daily   levothyroxine  175 mcg Oral Q0600   loratadine  10 mg Oral Daily   pantoprazole  40 mg Oral Daily   rosuvastatin  5 mg Oral Daily   Continuous Infusions:  cefTRIAXone (ROCEPHIN)  IV Stopped (11/10/18 0954)   vancomycin 1,000 mg (11/10/18 2304)     LOS: 1 day   The patient is critically ill with  multiple organ systems failure and requires high complexity decision making for assessment and support, frequent evaluation and titration of therapies, application of advanced monitoring technologies and extensive interpretation of multiple databases. Critical Care Time devoted  to patient care services described in this note  Time spent: 40 minutes     Laraine Samet, Geraldo Docker, MD [facility] Pager [phone]  If 7PM-7AM, please contact night-coverage [email or web address] Password [email or web address] 11/11/2018, 8:01 AM

## 2018-11-11 NOTE — Plan of Care (Signed)
°  Problem: Education: °Goal: Knowledge of General Education information will improve °Description: Including pain rating scale, medication(s)/side effects and non-pharmacologic comfort measures °Outcome: Progressing °  °Problem: Clinical Measurements: °Goal: Will remain free from infection °Outcome: Progressing °  °Problem: Pain Managment: °Goal: General experience of comfort will improve °Outcome: Progressing °  °Problem: Skin Integrity: °Goal: Risk for impaired skin integrity will decrease °Outcome: Progressing °  °

## 2018-11-12 DIAGNOSIS — E038 Other specified hypothyroidism: Secondary | ICD-10-CM

## 2018-11-12 DIAGNOSIS — A6000 Herpesviral infection of urogenital system, unspecified: Secondary | ICD-10-CM | POA: Diagnosis present

## 2018-11-12 HISTORY — DX: Herpesviral infection of urogenital system, unspecified: A60.00

## 2018-11-12 MED ORDER — VALACYCLOVIR HCL 500 MG PO TABS
500.0000 mg | ORAL_TABLET | Freq: Two times a day (BID) | ORAL | Status: DC
  Administered 2018-11-12 – 2018-11-13 (×3): 500 mg via ORAL
  Filled 2018-11-12 (×3): qty 1

## 2018-11-12 NOTE — Progress Notes (Signed)
PROGRESS NOTE    Jonathan Powers  [id number] DOB: 10-31-1950 DOA: 11/09/2018 PCP: [name], MD   Brief Narrative:  68 y.o. WM (orthopedic surgery PA) PMHx HTN, HLD, chronic back pain, Hashimoto's thyroiditis, hypothyroidism, hypogonadism, diverticulitis, cellulitis RIGHT leg, and reflux.  Over the past 4 weeks, the patient has noticed a small red area on his LEFT lower extremity continue to grow.  He had a virtual visit with a provider and was placed on 10 days of Keflex.  He noticed very mild improvement and then subsequent worsening.  He saw his PCP 3 days ago and was placed on doxycycline.  Since the start of that, he has not noticed any improvement.  He is not having any drainage or growths over the area.  He is having spreading of the redness and increased pain.  He denies any fevers or inciting event.  He did have surgery on his left ankle in May 2019.  No recent prolonged bedrest, injury, surgeries; no personal or family history of clotting disorders.  ED Course: Labs drawn and unremarkable.  COVID as ordered and pending upon admission.  ED team also ordered a lower extremity ultrasound due to concern for clot.   Subjective:  9/22 A/O x4, negative CP, negative S OB, negative abdominal pain, negative N/V.  Positive L LE pain significantly improved.  Positive penile pain, tip of the penis.   Assessment & Plan:   Principal Problem:   Cellulitis of left lower extremity Active Problems:   Essential hypertension   Dyslipidemia, goal LDL below 130   Hypothyroid   Chronic right-sided low back pain without sciatica   Cellulitis   Insomnia   GERD (gastroesophageal reflux disease)   Seasonal allergies   Left leg cellulitis   HLD (hyperlipidemia)   Other specified hypothyroidism   Chronic low back pain  Cellulitis LLE - Patient received course of Keflex + 1/2 course of doxycycline as outpatient without resolution of cellulitis and previously injured LEFT ankle. -  Patient's left ankle and shin erythematous, warm to touch painful to palpation but improving.  Given this is a severely injured ankle that was repaired would continue parenteral antibiotics at least 48 more hours, then consider switching to Bactrim to complete 7-day course. -Venous Doppler bilateral negative for DVT see results below -Patient concurs with treatment plan.  Essential HTN - Amlodipine 2.5 mg daily - Irbesartan 150 mg daily  CAD work-up - Dr. Adrian Prows cardiologist has ordered CT coronary and echocardiogram.  Pending  HLD - Crestor 5 mg daily  Hypothyroidism - Synthroid 175 mcg daily  Chronic right sided low back pain without sciatica - Tramadol and Robaxin  GERD - Protonix 40 mg daily  Insomnia - Trazodone 50-100 mg PRN  Recurrent genital HSV infection -Per patient previous history HSV-2 outbreak.  Currently not sexually active - Patient with lesions on the tip of penis that appear to be pustules that have been broken open at 12:00 and 9:00. - Valacyclovir 500 mg BID x3 days    DVT prophylaxis: Frequent ambulation Code Status: Full Family Communication: None Disposition Plan: 48 to 72 hours   Consultants:  Orthopedic surgery    Procedures/Significant Events:  9/20 LEFT foot x-ray;Osteopenia. No fracture or dislocation of the left foot. There is extensive subtalar and talonavicular plate and screw fusion of the hindfoot. Diffuse soft tissue edema about the foot and ankle. Consider MRI to further evaluate for marrow edema and osteomyelitis if clinically suspected. 9/21 MRI LEFT tibia fibula W/W0  contrast:Findings suggestive of cellulitis involving the mid to lower leg. No soft tissue abscess or other acute abnormality.  No evidence of osteomyelitis.  Lateral patellofemoral compartment chondral disease.  Loculated popliteal cyst without evidence of rupture   I have personally reviewed and interpreted all radiology studies and my findings are  as above.  VENTILATOR SETTINGS:    Cultures 9/19 blood LEFT wrist NGTD 9/19 blood RIGHT wrist NGTD 9/20 SARS coronavirus negative   Antimicrobials: Anti-infectives (From admission, onward)   Start     Stop   11/10/18 2200  vancomycin (VANCOCIN) IVPB 1000 mg/200 mL premix         11/10/18 0900  cefTRIAXone (ROCEPHIN) 1 g in sodium chloride 0.9 % 100 mL IVPB         11/10/18 0845  vancomycin (VANCOCIN) 2,000 mg in sodium chloride 0.9 % 500 mL IVPB     11/10/18 1250   11/10/18 0015  clindamycin (CLEOCIN) IVPB 600 mg  Status:  Discontinued     11/10/18 [id number]       Devices    LINES / TUBES:      Continuous Infusions:  cefTRIAXone (ROCEPHIN)  IV 1 g (11/12/18 0914)   vancomycin 1,000 mg (11/12/18 0959)     Objective: Vitals:   11/11/18 1522 11/11/18 2004 11/12/18 0340 11/12/18 0737  BP: 111/79 (!) 138/96 123/73 130/67  Pulse: (!) 55 (!) 59 (!) 53 (!) 52  Resp: 18 17 19 20   Temp: 98.7 F (37.1 C) 98.6 F (37 C) 97.7 F (36.5 C) 97.7 F (36.5 C)  TempSrc: Oral Oral Oral Oral  SpO2: 98% 98% 95% 98%  Weight:      Height:        Intake/Output Summary (Last 24 hours) at 11/12/2018 1406 Last data filed at 11/12/2018 0900 Gross per 24 hour  Intake 720 ml  Output --  Net 720 ml   Filed Weights   11/09/18 2058  Weight: 127.9 kg   Physical Exam:  General: A/O x4, no acute respiratory distress Eyes: negative scleral hemorrhage, negative anisocoria, negative icterus ENT: Negative Runny nose, negative gingival bleeding, Neck:  Negative scars, masses, torticollis, lymphadenopathy, JVD Lungs: Clear to auscultation bilaterally without wheezes or crackles Cardiovascular: Regular rate and rhythm without murmur gallop or rub normal S1 and S2 Abdomen: negative abdominal pain, nondistended, positive soft, bowel sounds, no rebound, no ascites, no appreciable mass Extremities: LEFT ankle with well-healed surgical scars consistent with ligamentous/tendon repair.   Positive swelling of foot cephalad to ankle, mild tenderness to palpation, mild erythema.  Mild warm to touch.   Genitourinary: To painful lesions on penis that appear to be the beginnings of an HSV outbreak.  Both current lesions broken open with some pus extruded, painful, one lesion at 12:00, the other lesion at 9:00. Skin: Negative rashes, lesions, ulcers Psychiatric:  Negative depression, negative anxiety, negative fatigue, negative mania  Central nervous system:  Cranial nerves II through XII intact, tongue/uvula midline, all extremities muscle strength 5/5, sensation intact throughout, walking on heels within normal limits, negative dysarthria, negative expressive aphasia, negative receptive aphasia.       Data Reviewed: Care during the described time interval was provided by me .  I have reviewed this patient's available data, including medical history, events of note, physical examination, and all test results as part of my evaluation.   CBC: Recent Labs  Lab 11/09/18 2104 11/10/18 0038 11/10/18 0321 11/11/18 0539  WBC 9.1 7.9 7.5 7.1  NEUTROABS  --   --   --  3.8  HGB 15.2 14.8 14.7 14.4  HCT 45.5 42.5 41.9 43.8  MCV 90.3 88.9 88.8 90.7  PLT 334 272 267 [id number]   Basic Metabolic Panel: Recent Labs  Lab 11/07/18 1632 11/09/18 2104 11/10/18 0321 11/11/18 0539  NA 140 135 137 139  K 4.5 3.7 3.9 4.1  CL 100 100 102 104  CO2 27 22 25 27   GLUCOSE 98 138* 110* 99  BUN 13 11 11 10   CREATININE 1.03 1.23 1.00 1.04  CALCIUM 9.4 9.2 9.0 9.2   GFR: Estimated Creatinine Clearance: 92.6 mL/min (by C-G formula based on SCr of 1.04 mg/dL). Liver Function Tests: Recent Labs  Lab 11/09/18 2104  AST 24  ALT 20  ALKPHOS 53  BILITOT 0.9  PROT 7.2  ALBUMIN 4.0   No results for input(s): LIPASE, AMYLASE in the last 168 hours. No results for input(s): AMMONIA in the last 168 hours. Coagulation Profile: No results for input(s): INR, PROTIME in the last 168 hours. Cardiac  Enzymes: Recent Labs  Lab 11/09/18 2104  CKTOTAL 152   BNP (last 3 results) No results for input(s): PROBNP in the last 8760 hours. HbA1C: Recent Labs    11/09/18 2104  HGBA1C 5.1   CBG: No results for input(s): GLUCAP in the last 168 hours. Lipid Profile: No results for input(s): CHOL, HDL, LDLCALC, TRIG, CHOLHDL, LDLDIRECT in the last 72 hours. Thyroid Function Tests: No results for input(s): TSH, T4TOTAL, FREET4, T3FREE, THYROIDAB in the last 72 hours. Anemia Panel: No results for input(s): VITAMINB12, FOLATE, FERRITIN, TIBC, IRON, RETICCTPCT in the last 72 hours. Urine analysis:    Component Value Date/Time   COLORURINE YELLOW 02/05/2018 [place] 02/05/2018 1629   LABSPEC 1.025 02/05/2018 1629   PHURINE 5.5 02/05/2018 1629   GLUCOSEU NEGATIVE 02/05/2018 1629   HGBUR NEGATIVE 02/05/2018 1629   BILIRUBINUR NEGATIVE 02/05/2018 1629   KETONESUR NEGATIVE 02/05/2018 1629   PROTEINUR NEGATIVE 09/12/2016 1708   UROBILINOGEN 0.2 02/05/2018 1629   NITRITE NEGATIVE 02/05/2018 1629   LEUKOCYTESUR NEGATIVE 02/05/2018 1629   Sepsis Labs: @LABRCNTIP (procalcitonin:4,lacticidven:4)  ) Recent Results (from the past 240 hour(s))  Blood culture (routine x 2)     Status: None (Preliminary result)   Collection Time: 11/09/18 11:51 PM   Specimen: BLOOD LEFT WRIST  Result Value Ref Range Status   Specimen Description BLOOD LEFT WRIST  Final   Special Requests   Final    BOTTLES DRAWN AEROBIC AND ANAEROBIC Blood Culture results may not be optimal due to an inadequate volume of blood received in culture bottles   Culture   Final    NO GROWTH 2 DAYS Performed at [facility], [place] [address], [place], [place] [address]    Report Status PENDING  Incomplete  Blood culture (routine x 2)     Status: None (Preliminary result)   Collection Time: 11/09/18 11:56 PM   Specimen: BLOOD RIGHT WRIST  Result Value Ref Range Status   Specimen Description BLOOD RIGHT WRIST   Final   Special Requests   Final    BOTTLES DRAWN AEROBIC AND ANAEROBIC Blood Culture results may not be optimal due to an inadequate volume of blood received in culture bottles   Culture   Final    NO GROWTH 2 DAYS Performed at [facility], [place] [address], [place], [place] [address]    Report Status PENDING  Incomplete  SARS CORONAVIRUS 2 (TAT 6-24 HRS) Nasopharyngeal Nasopharyngeal Swab  Status: None   Collection Time: 11/10/18 12:08 AM   Specimen: Nasopharyngeal Swab  Result Value Ref Range Status   SARS Coronavirus 2 NEGATIVE NEGATIVE Final    Comment: (NOTE) SARS-CoV-2 target nucleic acids are NOT DETECTED. The SARS-CoV-2 RNA is generally detectable in upper and lower respiratory specimens during the acute phase of infection. Negative results do not preclude SARS-CoV-2 infection, do not rule out co-infections with other pathogens, and should not be used as the sole basis for treatment or other patient management decisions. Negative results must be combined with clinical observations, patient history, and epidemiological information. The expected result is Negative. Fact Sheet for Patients: SugarRoll.be Fact Sheet for Healthcare Providers: https://www.-mathews.com/ This test is not yet approved or cleared by the [place] FDA and  has been authorized for detection and/or diagnosis of SARS-CoV-2 by FDA under an Emergency Use Authorization (EUA). This EUA will remain  in effect (meaning this test can be used) for the duration of the COVID-19 declaration under Section 56 4(b)(1) of the Act, 21 U.S.C. section 360bbb-3(b)(1), unless the authorization is terminated or revoked sooner. Performed at [facility], [place] [address] [place],  [address]          Radiology Studies: Mr Tibia Fibula Left W Wo Contrast  Result Date: 11/10/2018 CLINICAL DATA:  Ankle swelling, cellulitis pain EXAM: MRI OF LOWER  LEFT EXTREMITY WITHOUT AND WITH CONTRAST TECHNIQUE: Multiplanar, multisequence MR imaging of the left lower extremity was performed both before and after administration of intravenous contrast. CONTRAST:  13mL GADAVIST GADOBUTROL 1 MMOL/ML IV SOLN COMPARISON:  None. FINDINGS: Bones/Joint/Cartilage No fracture or avascular necrosis. There is subchondral marrow signal changes seen at the lateral tibial plateau and patellofemoral compartment, from osteoarthritis. No pathologic marrow infiltration. No evidence of abnormal enhancement. No large knee joint effusion is seen. There is a loculated popliteal cyst seen measuring 3 cm. No evidence of cyst rupture. Ligaments The ACL and PCL are intact. The ligaments of the ankle are suboptimally visualized. Muscles and Tendons There is mild fatty atrophy seen within the muscles of the lower extremity. The muscles are otherwise intact. No focal muscular tear or areas focal enhancement. The tendons are intact without evidence of tendinopathy or tear. Soft tissues There is mildly enhancing diffuse skin thickening and subcutaneous edema seen surrounding the mid to lower leg. No loculated fluid collections are seen within the soft tissues. No enhancing soft tissue mass. IMPRESSION: Findings suggestive of cellulitis involving the mid to lower leg. No soft tissue abscess or other acute abnormality. No evidence of osteomyelitis. Lateral patellofemoral compartment chondral disease. Loculated popliteal cyst without evidence of rupture Electronically Signed   By: Prudencio Pair M.D.   On: 11/10/2018 19:01   Ct Coronary Morph W/cta Cor W/score W/ca W/cm &/or Wo/cm  Addendum Date: 11/11/2018   ADDENDUM REPORT: 11/11/2018 21:46 HISTORY: 68 yo male with exertional chest pain and dyspnea EXAM: Cardiac/Coronary CTA TECHNIQUE: The patient was scanned on a [employer]. PROTOCOL: A 120 kV prospective scan was triggered in the descending thoracic aorta at 111 HU's. Axial non-contrast 3  mm slices were carried out through the heart. The data set was analyzed on a dedicated work station and scored using the [place]. Gantry rotation speed was 250 msecs and collimation was .6 mm. Beta blockade and 0.8 mg of sl NTG was given. The 3D data set was reconstructed in 5% intervals of the 67-82 % of the R-R cycle. Diastolic phases were analyzed on a dedicated  work station using MPR, MIP and VRT modes. The patient received 155mL OMNIPAQUE IOHEXOL 350 MG/ML SOLN of contrast. FINDINGS: Quality: Excellent Coronary calcium score: The patient's coronary artery calcium score is 250.78, which places the patient in the 66th percentile. Coronary arteries: Normal coronary origins.  Right dominance. Right Coronary Artery: Dominant vessel. Large conus branch. Minimal 1-24% proximal mixed stenosis (CADRADS1). Left Main Coronary Artery: No significant obstruction. Left Anterior Descending Coronary Artery: Large vessel which gives off 2 moderate sized diagonal branches. There is minimal mixed 1-24% stenosis (CADRADS1) of the proximal to mid-LAD. Calcium is noted at the ostium of a small (<2.0 mm) D2 branch without significant obstruction. Left Circumflex Artery: Large caliber vessel which gives off a large OM1 branch and several smaller distal OM2 branches. There is minimal 1-24% stenosis (CADRADS1) at the LCx/OM1 bifurcation and seen in the ostial OM1. Aorta: Normal size, 33 mm at the mid ascending aorta (level of the PA bifurcation) measured double oblique. Atherosclerotic calcification noted. No dissection. Aortic Valve: Trileaflet.  No calcifications. Other findings: Normal pulmonary vein drainage into the left atrium. Normal left atrial appendage without a thrombus. Normal size of the pulmonary artery. IMPRESSION: 1. Minimal non-obstructive CAD, CADRADS = 0. 2. Coronary calcium score of 250.78. This was 66th percentile for age and sex matched control. 3. Normal coronary origin with right dominance. 4.  Atherosclerotic calcification of the aorta. Electronically Signed   By: Pixie Casino M.D.   On: 11/11/2018 21:46   Result Date: 11/11/2018 EXAM: OVER-READ INTERPRETATION  CT CHEST The following report is an over-read performed by radiologist Dr. Rolm Baptise of [facility], [place] on 11/11/2018. This over-read does not include interpretation of cardiac or coronary anatomy or pathology. The coronary CTA interpretation by the cardiologist is attached. COMPARISON:  None. FINDINGS: Vascular: Heart is normal size.  Aorta normal caliber. Mediastinum/Nodes: No adenopathy in the lower mediastinum or hila. Lungs/Pleura: Visualized lungs clear.  No effusions. Upper Abdomen: Imaging into the upper abdomen shows no acute findings. Musculoskeletal: Chest wall soft tissues are unremarkable. No acute bony abnormality. IMPRESSION: No acute or significant extracardiac abnormality. Electronically Signed: By: Rolm Baptise M.D. On: 11/11/2018 15:53        Scheduled Meds:  amLODipine  2.5 mg Oral Daily   enoxaparin (LOVENOX) injection  60 mg Subcutaneous Q24H   irbesartan  150 mg Oral Daily   levothyroxine  175 mcg Oral Q0600   loratadine  10 mg Oral Daily   pantoprazole  40 mg Oral Daily   rosuvastatin  5 mg Oral Daily   Continuous Infusions:  cefTRIAXone (ROCEPHIN)  IV 1 g (11/12/18 0914)   vancomycin 1,000 mg (11/12/18 0959)     LOS: 2 days   The patient is critically ill with multiple organ systems failure and requires high complexity decision making for assessment and support, frequent evaluation and titration of therapies, application of advanced monitoring technologies and extensive interpretation of multiple databases. Critical Care Time devoted to patient care services described in this note  Time spent: 40 minutes     Josephus Harriger, Geraldo Docker, MD [facility] Pager [phone]  If 7PM-7AM, please contact night-coverage [email or web address] Password [facility] 11/12/2018, 2:06 PM

## 2018-11-13 LAB — C DIFFICILE QUICK SCREEN W PCR REFLEX
C Diff antigen: NEGATIVE
C Diff interpretation: NOT DETECTED
C Diff toxin: NEGATIVE

## 2018-11-13 MED ORDER — SODIUM CHLORIDE 0.9 % IV SOLN: 2.0000 g | INTRAVENOUS | Status: DC

## 2018-11-13 MED ORDER — SULFAMETHOXAZOLE-TRIMETHOPRIM 800-160 MG PO TABS: 1.0000 | ORAL_TABLET | Freq: Two times a day (BID) | ORAL | 0 refills | Status: AC

## 2018-11-13 NOTE — Discharge Summary (Signed)
Physician Discharge Summary  Jonathan Powers [id number] DOB: 13-Jul-1950 DOA: 11/09/2018  PCP: [name], MD  Admit date: 11/09/2018 Discharge date: 11/13/2018   Admitted From: Home Disposition: Home  Recommendations for Outpatient Follow-up:  1. Follow up with PCP in 1-2 weeks 2. Please obtain BMP/CBC in one week  Discharge Condition: Stable CODE STATUS: Full Diet recommendation: As tolerated, cardiac prudent  Brief/Interim Summary: 68 y.o.WM (orthopedic surgery PA) PMHx HTN, HLD, chronic back pain, Hashimoto's thyroiditis, hypothyroidism, hypogonadism, diverticulitis, cellulitis RIGHT leg, and reflux.  Over the past 4 weeks, the patient has noticed a small red area on his LEFT lower extremity continue to grow. He had a virtual visit with a provider and was placed on 10 days of Keflex. He noticed very mild improvement and then subsequent worsening. He saw his PCP 3 days ago and was placed on doxycycline. Since the start of that, he has not noticed any improvement. He is not having any drainage or growths over the area. He is having spreading of the redness and increased pain. He denies any fevers or inciting event. He did have surgery on his left ankle in May 2019. No recent prolonged bedrest, injury, surgeries;no personal or family history of clotting disorders.  In EDlabs drawn and unremarkable. COVID as ordered and pending upon admission. ED team also ordered a lower extremity ultrasound due to concern for clot.  Patient admitted as above with swelling and erythema in the left lower extremity concerning for cellulitis.  Patient previously completed 10 days of Keflex and 3 days of doxycycline prior to admission.  At this time patient has improved quite drastically on current regimen of ceftriaxone and vancomycin.  Given resolution with vancomycin will continue treatment in the outpatient setting with Bactrim given concern for MRSA as patient already had 10 days of  Keflex and carries a penicillin allergy per discussion today at bedside.  She otherwise feels quite well, ambulating, denies fevers, chills, otherwise feels quite well and agreeable for discharge home.  Patient will need close follow-up with PCP in the next 7 to 10 days to ensure resolution of cellulitis and infection as above.  Discharge Diagnoses:  Principal Problem:   Cellulitis of left lower extremity Active Problems:   Essential hypertension   Dyslipidemia, goal LDL below 130   Hypothyroid   Chronic right-sided low back pain without sciatica   Cellulitis   Insomnia   GERD (gastroesophageal reflux disease)   Seasonal allergies   Left leg cellulitis   HLD (hyperlipidemia)   Other specified hypothyroidism   Chronic low back pain   Recurrent genital HSV (herpes simplex virus) infection  Discharge Instructions   Allergies as of 11/13/2018      Reactions   Nsaids Shortness Of Breath   Naproxen specifically causes SOB/wheeze tolerates topical diclofenac without problem Tylenol OK   Tolmetin Shortness Of Breath   Naproxen specifically causes SOB/wheeze tolerates topical diclofenac without problem Tylenol OK   Penicillins Rash      Medication List    STOP taking these medications   doxycycline 100 MG capsule Commonly known as: VIBRAMYCIN     TAKE these medications   acetaminophen 500 MG tablet Commonly known as: TYLENOL Take 1,000 mg by mouth 2 (two) times daily.   allopurinol 100 MG tablet Commonly known as: ZYLOPRIM Take 1 tablet (100 mg total) by mouth daily for 7 days, THEN 2 tablets (200 mg total) daily for 7 days, THEN 3 tablets (300 mg total) daily for 14 days. Start  taking on: Jul 10, 2018   amLODipine 2.5 MG tablet Commonly known as: NORVASC Take 1 tablet (2.5 mg total) by mouth daily.   cetirizine 10 MG tablet Commonly known as: ZYRTEC Take 10 mg by mouth daily.   diclofenac sodium 1 % Gel Commonly known as: VOLTAREN 2-4 grams qid as needed for pain  as discussed in office   esomeprazole 20 MG capsule Commonly known as: NEXIUM Take 20 mg by mouth daily as needed (for acid reflux).   fluocinonide-emollient 0.05 % cream Commonly known as: LIDEX-E Apply 1 application topically 2 (two) times daily.   hydrocortisone-pramoxine rectal foam Commonly known as: Proctofoam HC Place 1 applicator rectally 2 (two) times daily.   levothyroxine 175 MCG tablet Commonly known as: SYNTHROID Take 1 tablet (175 mcg total) by mouth daily before breakfast.   methocarbamol 500 MG tablet Commonly known as: ROBAXIN TAKE ONE TABLET BY MOUTH DAILY AT BEDTIME What changed:   when to take this  reasons to take this   ondansetron 8 MG disintegrating tablet Commonly known as: Zofran ODT Take 1 tablet (8 mg total) by mouth every 8 (eight) hours as needed for nausea or vomiting.   rosuvastatin 5 MG tablet Commonly known as: CRESTOR TAKE ONE TABLET BY MOUTH ONE TIME DAILY What changed: when to take this   sulfamethoxazole-trimethoprim 800-160 MG tablet Commonly known as: BACTRIM DS Take 1 tablet by mouth 2 (two) times daily for 10 days.   telmisartan 40 MG tablet Commonly known as: MICARDIS TAKE ONE TABLET BY MOUTH ONE TIME DAILY   Teriparatide (Recombinant) 600 MCG/2.4ML Soln Inject 0.08 mLs (20 mcg total) into the skin daily.   traMADol 50 MG tablet Commonly known as: ULTRAM Take 1 tablet (50 mg total) by mouth 3 (three) times daily as needed. What changed: when to take this   traZODone 100 MG tablet Commonly known as: DESYREL Take 0.5-1 tablets (50-100 mg total) by mouth at bedtime as needed for sleep. What changed:   how much to take  when to take this   valACYclovir 1000 MG tablet Commonly known as: VALTREX Take 1 tablet (1,000 mg total) by mouth 2 (two) times daily.   Vitamin D-3 125 MCG (5000 UT) Tabs Take 5,000 Units by mouth daily.       Allergies  Allergen Reactions  . Nsaids Shortness Of Breath    Naproxen  specifically causes SOB/wheeze tolerates topical diclofenac without problem Tylenol OK  . Tolmetin Shortness Of Breath    Naproxen specifically causes SOB/wheeze tolerates topical diclofenac without problem Tylenol OK  . Penicillins Rash    Consultations:  - None   Procedures/Studies: Mr Tibia Fibula Left W Wo Contrast  Result Date: 11/10/2018 CLINICAL DATA:  Ankle swelling, cellulitis pain EXAM: MRI OF LOWER LEFT EXTREMITY WITHOUT AND WITH CONTRAST TECHNIQUE: Multiplanar, multisequence MR imaging of the left lower extremity was performed both before and after administration of intravenous contrast. CONTRAST:  50mL GADAVIST GADOBUTROL 1 MMOL/ML IV SOLN COMPARISON:  None. FINDINGS: Bones/Joint/Cartilage No fracture or avascular necrosis. There is subchondral marrow signal changes seen at the lateral tibial plateau and patellofemoral compartment, from osteoarthritis. No pathologic marrow infiltration. No evidence of abnormal enhancement. No large knee joint effusion is seen. There is a loculated popliteal cyst seen measuring 3 cm. No evidence of cyst rupture. Ligaments The ACL and PCL are intact. The ligaments of the ankle are suboptimally visualized. Muscles and Tendons There is mild fatty atrophy seen within the muscles of the lower extremity. The  muscles are otherwise intact. No focal muscular tear or areas focal enhancement. The tendons are intact without evidence of tendinopathy or tear. Soft tissues There is mildly enhancing diffuse skin thickening and subcutaneous edema seen surrounding the mid to lower leg. No loculated fluid collections are seen within the soft tissues. No enhancing soft tissue mass. IMPRESSION: Findings suggestive of cellulitis involving the mid to lower leg. No soft tissue abscess or other acute abnormality. No evidence of osteomyelitis. Lateral patellofemoral compartment chondral disease. Loculated popliteal cyst without evidence of rupture Electronically Signed   By:  Prudencio Pair M.D.   On: 11/10/2018 19:01   Ct Coronary Morph W/cta Cor W/score W/ca W/cm &/or Wo/cm  Addendum Date: 11/11/2018   ADDENDUM REPORT: 11/11/2018 21:46 HISTORY: 68 yo male with exertional chest pain and dyspnea EXAM: Cardiac/Coronary CTA TECHNIQUE: The patient was scanned on a [employer]. PROTOCOL: A 120 kV prospective scan was triggered in the descending thoracic aorta at 111 HU's. Axial non-contrast 3 mm slices were carried out through the heart. The data set was analyzed on a dedicated work station and scored using the [place]. Gantry rotation speed was 250 msecs and collimation was .6 mm. Beta blockade and 0.8 mg of sl NTG was given. The 3D data set was reconstructed in 5% intervals of the 67-82 % of the R-R cycle. Diastolic phases were analyzed on a dedicated work station using MPR, MIP and VRT modes. The patient received 152mL OMNIPAQUE IOHEXOL 350 MG/ML SOLN of contrast. FINDINGS: Quality: Excellent Coronary calcium score: The patient's coronary artery calcium score is 250.78, which places the patient in the 66th percentile. Coronary arteries: Normal coronary origins.  Right dominance. Right Coronary Artery: Dominant vessel. Large conus branch. Minimal 1-24% proximal mixed stenosis (CADRADS1). Left Main Coronary Artery: No significant obstruction. Left Anterior Descending Coronary Artery: Large vessel which gives off 2 moderate sized diagonal branches. There is minimal mixed 1-24% stenosis (CADRADS1) of the proximal to mid-LAD. Calcium is noted at the ostium of a small (<2.0 mm) D2 branch without significant obstruction. Left Circumflex Artery: Large caliber vessel which gives off a large OM1 branch and several smaller distal OM2 branches. There is minimal 1-24% stenosis (CADRADS1) at the LCx/OM1 bifurcation and seen in the ostial OM1. Aorta: Normal size, 33 mm at the mid ascending aorta (level of the PA bifurcation) measured double oblique. Atherosclerotic calcification  noted. No dissection. Aortic Valve: Trileaflet.  No calcifications. Other findings: Normal pulmonary vein drainage into the left atrium. Normal left atrial appendage without a thrombus. Normal size of the pulmonary artery. IMPRESSION: 1. Minimal non-obstructive CAD, CADRADS = 0. 2. Coronary calcium score of 250.78. This was 66th percentile for age and sex matched control. 3. Normal coronary origin with right dominance. 4. Atherosclerotic calcification of the aorta. Electronically Signed   By: Pixie Casino M.D.   On: 11/11/2018 21:46   Result Date: 11/11/2018 EXAM: OVER-READ INTERPRETATION  CT CHEST The following report is an over-read performed by radiologist Dr. Rolm Baptise of [facility], [place] on 11/11/2018. This over-read does not include interpretation of cardiac or coronary anatomy or pathology. The coronary CTA interpretation by the cardiologist is attached. COMPARISON:  None. FINDINGS: Vascular: Heart is normal size.  Aorta normal caliber. Mediastinum/Nodes: No adenopathy in the lower mediastinum or hila. Lungs/Pleura: Visualized lungs clear.  No effusions. Upper Abdomen: Imaging into the upper abdomen shows no acute findings. Musculoskeletal: Chest wall soft tissues are unremarkable. No acute bony abnormality. IMPRESSION: No acute or significant extracardiac abnormality.  Electronically Signed: By: Rolm Baptise M.D. On: 11/11/2018 15:53   Dg Foot Complete Left  Result Date: 11/10/2018 CLINICAL DATA:  Cellulitis EXAM: LEFT FOOT - COMPLETE 3+ VIEW COMPARISON:  None. FINDINGS: Osteopenia. No fracture or dislocation of the left foot. There is extensive subtalar and talonavicular plate and screw fusion of the hindfoot. Diffuse soft tissue edema about the foot and ankle. IMPRESSION: Osteopenia. No fracture or dislocation of the left foot. There is extensive subtalar and talonavicular plate and screw fusion of the hindfoot. Diffuse soft tissue edema about the foot and ankle. Consider MRI to further  evaluate for marrow edema and osteomyelitis if clinically suspected. Electronically Signed   By: Eddie Candle M.D.   On: 11/10/2018 13:34   Vas [place] Lower Extremity Venous (dvt) (only Mc & Wl 7a-7p)  Result Date: 11/10/2018  Lower Venous Study Indications: Swelling, Erythema, and cellulitis.  Risk Factors: Known left Baker's cyst. Comparison Study: Prior study from 03/06/17 is available for comparison Performing Technologist: Sharion Dove RVS  Examination Guidelines: A complete evaluation includes B-mode imaging, spectral Doppler, color Doppler, and power Doppler as needed of all accessible portions of each vessel. Bilateral testing is considered an integral part of a complete examination. Limited examinations for reoccurring indications may be performed as noted.  +-----+---------------+---------+-----------+----------+--------------+ RIGHTCompressibilityPhasicitySpontaneityPropertiesThrombus Aging +-----+---------------+---------+-----------+----------+--------------+ CFV  Full           Yes      Yes                                 +-----+---------------+---------+-----------+----------+--------------+   +---------+---------------+---------+-----------+----------+--------------+ LEFT     CompressibilityPhasicitySpontaneityPropertiesThrombus Aging +---------+---------------+---------+-----------+----------+--------------+ CFV      Full           Yes      Yes                                 +---------+---------------+---------+-----------+----------+--------------+ SFJ      Full                                                        +---------+---------------+---------+-----------+----------+--------------+ FV Prox  Full                                                        +---------+---------------+---------+-----------+----------+--------------+ FV Mid   Full                                                         +---------+---------------+---------+-----------+----------+--------------+ FV DistalFull                                                        +---------+---------------+---------+-----------+----------+--------------+ PFV      Full                                                        +---------+---------------+---------+-----------+----------+--------------+  POP      Full           Yes      Yes                                 +---------+---------------+---------+-----------+----------+--------------+ PTV      Full                                                        +---------+---------------+---------+-----------+----------+--------------+ PERO     Full                                                        +---------+---------------+---------+-----------+----------+--------------+     Summary: Right: No evidence of common femoral vein obstruction. Left: There is no evidence of deep vein thrombosis in the lower extremity. A cystic structure is found in the popliteal fossa.  *See table(s) above for measurements and observations. Electronically signed by Ruta Hinds MD on 11/10/2018 at 10:59:20 AM.    Final     Subjective: No acute issues or events overnight, denies fever, chills, shortness of breath, chest pain, nausea, vomiting, diarrhea, constipation.  Discharge Exam: Vitals:   11/13/18 0434 11/13/18 0824  BP: 113/74 (!) 142/77  Pulse: (!) 54 (!) 55  Resp: 16   Temp: 97.8 F (36.6 C) 98.3 F (36.8 C)  SpO2: 98% 97%   Vitals:   11/12/18 1522 11/12/18 2008 11/13/18 0434 11/13/18 0824  BP: 125/77 (!) 144/79 113/74 (!) 142/77  Pulse: (!) 57 (!) 57 (!) 54 (!) 55  Resp:  16 16   Temp: 99.3 F (37.4 C) 98.3 F (36.8 C) 97.8 F (36.6 C) 98.3 F (36.8 C)  TempSrc: Oral Oral Oral Oral  SpO2: 98% 99% 98% 97%  Weight:      Height:        General:  Pleasantly resting in bed, No acute distress. HEENT:  Normocephalic atraumatic.  Sclerae nonicteric,  noninjected.  Extraocular movements intact bilaterally. Neck:  Without mass or deformity.  Trachea is midline. Lungs:  Clear to auscultate bilaterally without rhonchi, wheeze, or rales. Heart:  Regular rate and rhythm.  Without murmurs, rubs, or gallops. Abdomen:  Soft, nontender, nondistended.  Without guarding or rebound. Extremities: Scant blanching erythema anterior left lower extremity just proximal to the ankle Vascular:  Dorsalis pedis and posterior tibial pulses palpable bilaterally. Skin:  Warm and dry, no erythema, no ulcerations.   The results of significant diagnostics from this hospitalization (including imaging, microbiology, ancillary and laboratory) are listed below for reference.     Microbiology: Recent Results (from the past 240 hour(s))  Blood culture (routine x 2)     Status: None (Preliminary result)   Collection Time: 11/09/18 11:51 PM   Specimen: BLOOD LEFT WRIST  Result Value Ref Range Status   Specimen Description BLOOD LEFT WRIST  Final   Special Requests   Final    BOTTLES DRAWN AEROBIC AND ANAEROBIC Blood Culture results may not be optimal due to an inadequate volume of blood received in culture bottles   Culture   Final    NO  GROWTH 2 DAYS Performed at [facility], [place] [address] [place], [place] [address]    Report Status PENDING  Incomplete  Blood culture (routine x 2)     Status: None (Preliminary result)   Collection Time: 11/09/18 11:56 PM   Specimen: BLOOD RIGHT WRIST  Result Value Ref Range Status   Specimen Description BLOOD RIGHT WRIST  Final   Special Requests   Final    BOTTLES DRAWN AEROBIC AND ANAEROBIC Blood Culture results may not be optimal due to an inadequate volume of blood received in culture bottles   Culture   Final    NO GROWTH 2 DAYS Performed at [facility], [place] [address], [place], [place] [address]    Report Status PENDING  Incomplete  SARS CORONAVIRUS 2 (TAT 6-24 HRS) Nasopharyngeal Nasopharyngeal Swab      Status: None   Collection Time: 11/10/18 12:08 AM   Specimen: Nasopharyngeal Swab  Result Value Ref Range Status   SARS Coronavirus 2 NEGATIVE NEGATIVE Final    Comment: (NOTE) SARS-CoV-2 target nucleic acids are NOT DETECTED. The SARS-CoV-2 RNA is generally detectable in upper and lower respiratory specimens during the acute phase of infection. Negative results do not preclude SARS-CoV-2 infection, do not rule out co-infections with other pathogens, and should not be used as the sole basis for treatment or other patient management decisions. Negative results must be combined with clinical observations, patient history, and epidemiological information. The expected result is Negative. Fact Sheet for Patients: SugarRoll.be Fact Sheet for Healthcare Providers: https://www.woods-mathews.com/ This test is not yet approved or cleared by the [place] FDA and  has been authorized for detection and/or diagnosis of SARS-CoV-2 by FDA under an Emergency Use Authorization (EUA). This EUA will remain  in effect (meaning this test can be used) for the duration of the COVID-19 declaration under Section 56 4(b)(1) of the Act, 21 U.S.C. section 360bbb-3(b)(1), unless the authorization is terminated or revoked sooner. Performed at [facility], [place] [address] [place], [place] [address]      Labs: Basic Metabolic Panel: Recent Labs  Lab 11/07/18 1632 11/09/18 2104 11/10/18 0321 11/11/18 0539  NA 140 135 137 139  K 4.5 3.7 3.9 4.1  CL 100 100 102 104  CO2 27 22 25 27   GLUCOSE 98 138* 110* 99  BUN 13 11 11 10   CREATININE 1.03 1.23 1.00 1.04  CALCIUM 9.4 9.2 9.0 9.2   Liver Function Tests: Recent Labs  Lab 11/09/18 2104  AST 24  ALT 20  ALKPHOS 53  BILITOT 0.9  PROT 7.2  ALBUMIN 4.0   CBC: Recent Labs  Lab 11/09/18 2104 11/10/18 0038 11/10/18 0321 11/11/18 0539  WBC 9.1 7.9 7.5 7.1  NEUTROABS  --   --   --  3.8  HGB  15.2 14.8 14.7 14.4  HCT 45.5 42.5 41.9 43.8  MCV 90.3 88.9 88.8 90.7  PLT 334 272 267 289   Cardiac Enzymes: Recent Labs  Lab 11/09/18 2104  CKTOTAL 152   Urinalysis    Component Value Date/Time   COLORURINE YELLOW 02/05/2018 [place] 02/05/2018 1629   LABSPEC 1.025 02/05/2018 1629   PHURINE 5.5 02/05/2018 1629   GLUCOSEU NEGATIVE 02/05/2018 1629   HGBUR NEGATIVE 02/05/2018 1629   BILIRUBINUR NEGATIVE 02/05/2018 [place] 02/05/2018 1629   PROTEINUR NEGATIVE 09/12/2016 1708   UROBILINOGEN 0.2 02/05/2018 1629   NITRITE NEGATIVE 02/05/2018 1629   LEUKOCYTESUR NEGATIVE 02/05/2018 1629  Microbiology Recent Results (from the past 240 hour(s))  Blood culture (routine x 2)     Status: None (Preliminary result)   Collection Time: 11/09/18 11:51 PM   Specimen: BLOOD LEFT WRIST  Result Value Ref Range Status   Specimen Description BLOOD LEFT WRIST  Final   Special Requests   Final    BOTTLES DRAWN AEROBIC AND ANAEROBIC Blood Culture results may not be optimal due to an inadequate volume of blood received in culture bottles   Culture   Final    NO GROWTH 2 DAYS Performed at [facility], [place] [address], [place], [place] [address]    Report Status PENDING  Incomplete  Blood culture (routine x 2)     Status: None (Preliminary result)   Collection Time: 11/09/18 11:56 PM   Specimen: BLOOD RIGHT WRIST  Result Value Ref Range Status   Specimen Description BLOOD RIGHT WRIST  Final   Special Requests   Final    BOTTLES DRAWN AEROBIC AND ANAEROBIC Blood Culture results may not be optimal due to an inadequate volume of blood received in culture bottles   Culture   Final    NO GROWTH 2 DAYS Performed at [facility], [place] [address] [place], [place] [address]    Report Status PENDING  Incomplete  SARS CORONAVIRUS 2 (TAT 6-24 HRS) Nasopharyngeal Nasopharyngeal Swab     Status: None   Collection Time: 11/10/18 12:08 AM   Specimen:  Nasopharyngeal Swab  Result Value Ref Range Status   SARS Coronavirus 2 NEGATIVE NEGATIVE Final    Comment: (NOTE) SARS-CoV-2 target nucleic acids are NOT DETECTED. The SARS-CoV-2 RNA is generally detectable in upper and lower respiratory specimens during the acute phase of infection. Negative results do not preclude SARS-CoV-2 infection, do not rule out co-infections with other pathogens, and should not be used as the sole basis for treatment or other patient management decisions. Negative results must be combined with clinical observations, patient history, and epidemiological information. The expected result is Negative. Fact Sheet for Patients: SugarRoll.be Fact Sheet for Healthcare Providers: https://www.woods-mathews.com/ This test is not yet approved or cleared by the [place] FDA and  has been authorized for detection and/or diagnosis of SARS-CoV-2 by FDA under an Emergency Use Authorization (EUA). This EUA will remain  in effect (meaning this test can be used) for the duration of the COVID-19 declaration under Section 56 4(b)(1) of the Act, 21 U.S.C. section 360bbb-3(b)(1), unless the authorization is terminated or revoked sooner. Performed at [facility], [place] [address] [place], [place] [address]     Time coordinating discharge: Over 30 minutes  SIGNED:  Little Ishikawa, DO [facility] 11/13/2018, 10:13 AM Pager   If 7PM-7AM, please contact night-coverage [email or web address] Password [email or web address]

## 2018-11-13 NOTE — Progress Notes (Signed)
Pt given discharge instructions and gone over with him. Pt verbalized understanding, all questions answered. Pt gathered belongings to be taken home.

## 2018-11-15 ENCOUNTER — Telehealth: Payer: Self-pay | Admitting: *Deleted

## 2018-11-15 DIAGNOSIS — L0889 Other specified local infections of the skin and subcutaneous tissue: Secondary | ICD-10-CM | POA: Diagnosis not present

## 2018-11-15 DIAGNOSIS — N485 Ulcer of penis: Secondary | ICD-10-CM | POA: Diagnosis not present

## 2018-11-15 LAB — CULTURE, BLOOD (ROUTINE X 2)
Culture: NO GROWTH
Culture: NO GROWTH

## 2018-11-15 NOTE — Telephone Encounter (Signed)
Tried calling pt to make hosp f/u appt there was no answer LMOM RTC../lmb 

## 2018-11-18 NOTE — Telephone Encounter (Signed)
Called pt again to make appt there was no answer. LMOM to RTC to schedule. Per chart he has made appt to see cardiology 11/27/18. Closing phone note encounter.Marland KitchenJohny Chess

## 2018-11-19 ENCOUNTER — Other Ambulatory Visit: Payer: Self-pay | Admitting: Occupational Medicine

## 2018-11-20 LAB — COMPLETE METABOLIC PANEL WITH GFR
AG Ratio: 1.7 (calc) (ref 1.0–2.5)
ALT: 25 U/L (ref 9–46)
AST: 25 U/L (ref 10–35)
Albumin: 4.3 g/dL (ref 3.6–5.1)
Alkaline phosphatase (APISO): 52 U/L (ref 35–144)
BUN: 11 mg/dL (ref 7–25)
CO2: 25 mmol/L (ref 20–32)
Calcium: 9.5 mg/dL (ref 8.6–10.3)
Chloride: 103 mmol/L (ref 98–110)
Creat: 1.2 mg/dL (ref 0.70–1.25)
GFR, Est African American: 72 mL/min/{1.73_m2} (ref >=60)
GFR, Est Non African American: 62 mL/min/{1.73_m2} (ref >=60)
Globulin: 2.6 g/dL (calc) (ref 1.9–3.7)
Glucose, Bld: 69 mg/dL (ref 65–99)
Potassium: 4 mmol/L (ref 3.5–5.3)
Sodium: 138 mmol/L (ref 135–146)
Total Bilirubin: 0.6 mg/dL (ref 0.2–1.2)
Total Protein: 6.9 g/dL (ref 6.1–8.1)

## 2018-11-20 LAB — CBC WITH DIFFERENTIAL/PLATELET
Absolute Monocytes: 836 cells/uL (ref 200–950)
Basophils Absolute: 68 cells/uL (ref 0–200)
Basophils Relative: 1 %
Eosinophils Absolute: 177 cells/uL (ref 15–500)
Eosinophils Relative: 2.6 %
HCT: 45.9 % (ref 38.5–50.0)
Hemoglobin: 15.7 g/dL (ref 13.2–17.1)
Lymphs Abs: 2074 cells/uL (ref 850–3900)
MCH: 30.5 pg (ref 27.0–33.0)
MCHC: 34.2 g/dL (ref 32.0–36.0)
MCV: 89.1 fL (ref 80.0–100.0)
MPV: 8.7 fL (ref 7.5–12.5)
Monocytes Relative: 12.3 %
Neutro Abs: 3645 cells/uL (ref 1500–7800)
Neutrophils Relative %: 53.6 %
Platelets: 296 10*3/uL (ref 140–400)
RBC: 5.15 10*6/uL (ref 4.20–5.80)
RDW: 12.9 % (ref 11.0–15.0)
Total Lymphocyte: 30.5 %
WBC: 6.8 10*3/uL (ref 3.8–10.8)

## 2018-11-20 LAB — LIPID PANEL
Cholesterol: 108 mg/dL (ref <200)
HDL: 39 mg/dL — ABNORMAL LOW (ref >=40)
LDL Cholesterol (Calc): 40 mg/dL (calc)
Non-HDL Cholesterol (Calc): 69 mg/dL (calc) (ref <130)
Total CHOL/HDL Ratio: 2.8 (calc) (ref <5.0)
Triglycerides: 235 mg/dL — ABNORMAL HIGH (ref <150)

## 2018-11-20 LAB — PSA: PSA: 0.5 ng/mL (ref <=4.0)

## 2018-11-26 ENCOUNTER — Encounter: Payer: Self-pay | Admitting: Internal Medicine

## 2018-11-26 ENCOUNTER — Ambulatory Visit (INDEPENDENT_AMBULATORY_CARE_PROVIDER_SITE_OTHER): Payer: Medicare Other | Admitting: Internal Medicine

## 2018-11-26 ENCOUNTER — Other Ambulatory Visit: Payer: Self-pay

## 2018-11-26 ENCOUNTER — Other Ambulatory Visit (INDEPENDENT_AMBULATORY_CARE_PROVIDER_SITE_OTHER): Payer: Medicare Other

## 2018-11-26 VITALS — BP 120/72 | HR 60 | Temp 98.6°F | Resp 16 | Ht 70.0 in | Wt 273.0 lb

## 2018-11-26 DIAGNOSIS — I10 Essential (primary) hypertension: Secondary | ICD-10-CM

## 2018-11-26 DIAGNOSIS — R197 Diarrhea, unspecified: Secondary | ICD-10-CM

## 2018-11-26 DIAGNOSIS — E039 Hypothyroidism, unspecified: Secondary | ICD-10-CM

## 2018-11-26 DIAGNOSIS — L03116 Cellulitis of left lower limb: Secondary | ICD-10-CM

## 2018-11-26 LAB — CBC WITH DIFFERENTIAL/PLATELET
Basophils Absolute: 0 10*3/uL (ref 0.0–0.1)
Basophils Relative: 0.6 % (ref 0.0–3.0)
Eosinophils Absolute: 0.3 10*3/uL (ref 0.0–0.7)
Eosinophils Relative: 5.7 % — ABNORMAL HIGH (ref 0.0–5.0)
HCT: 42.2 % (ref 39.0–52.0)
Hemoglobin: 14.4 g/dL (ref 13.0–17.0)
Lymphocytes Relative: 38.6 % (ref 12.0–46.0)
Lymphs Abs: 2.2 10*3/uL (ref 0.7–4.0)
MCHC: 34.1 g/dL (ref 30.0–36.0)
MCV: 88.3 fl (ref 78.0–100.0)
Monocytes Absolute: 0.6 10*3/uL (ref 0.1–1.0)
Monocytes Relative: 10.5 % (ref 3.0–12.0)
Neutro Abs: 2.5 10*3/uL (ref 1.4–7.7)
Neutrophils Relative %: 44.6 % (ref 43.0–77.0)
Platelets: 304 10*3/uL (ref 150.0–400.0)
RBC: 4.78 Mil/uL (ref 4.22–5.81)
RDW: 13.5 % (ref 11.5–15.5)
WBC: 5.6 10*3/uL (ref 4.0–10.5)

## 2018-11-26 LAB — TSH: TSH: 1.22 u[IU]/mL (ref 0.35–4.50)

## 2018-11-26 NOTE — Patient Instructions (Signed)
Hypothyroidism  Hypothyroidism is when the thyroid gland does not make enough of certain hormones (it is underactive). The thyroid gland is a small gland located in the lower front part of the neck, just in front of the windpipe (trachea). This gland makes hormones that help control how the body uses food for energy (metabolism) as well as how the heart and brain function. These hormones also play a role in keeping your bones strong. When the thyroid is underactive, it produces too little of the hormones thyroxine (T4) and triiodothyronine (T3). What are the causes? This condition may be caused by:  Hashimoto's disease. This is a disease in which the body's disease-fighting system (immune system) attacks the thyroid gland. This is the most common cause.  Viral infections.  Pregnancy.  Certain medicines.  Birth defects.  Past radiation treatments to the head or neck for cancer.  Past treatment with radioactive iodine.  Past exposure to radiation in the environment.  Past surgical removal of part or all of the thyroid.  Problems with a gland in the center of the brain (pituitary gland).  Lack of enough iodine in the diet. What increases the risk? You are more likely to develop this condition if:  You are male.  You have a family history of thyroid conditions.  You use a medicine called lithium.  You take medicines that affect the immune system (immunosuppressants). What are the signs or symptoms? Symptoms of this condition include:  Feeling as though you have no energy (lethargy).  Not being able to tolerate cold.  Weight gain that is not explained by a change in diet or exercise habits.  Lack of appetite.  Dry skin.  Coarse hair.  Menstrual irregularity.  Slowing of thought processes.  Constipation.  Sadness or depression. How is this diagnosed? This condition may be diagnosed based on:  Your symptoms, your medical history, and a physical exam.  Blood  tests. You may also have imaging tests, such as an ultrasound or MRI. How is this treated? This condition is treated with medicine that replaces the thyroid hormones that your body does not make. After you begin treatment, it may take several weeks for symptoms to go away. Follow these instructions at home:  Take over-the-counter and prescription medicines only as told by your health care provider.  If you start taking any new medicines, tell your health care provider.  Keep all follow-up visits as told by your health care provider. This is important. ? As your condition improves, your dosage of thyroid hormone medicine may change. ? You will need to have blood tests regularly so that your health care provider can monitor your condition. Contact a health care provider if:  Your symptoms do not get better with treatment.  You are taking thyroid replacement medicine and you: ? Sweat a lot. ? Have tremors. ? Feel anxious. ? Lose weight rapidly. ? Cannot tolerate heat. ? Have emotional swings. ? Have diarrhea. ? Feel weak. Get help right away if you have:  Chest pain.  An irregular heartbeat.  A rapid heartbeat.  Difficulty breathing. Summary  Hypothyroidism is when the thyroid gland does not make enough of certain hormones (it is underactive).  When the thyroid is underactive, it produces too little of the hormones thyroxine (T4) and triiodothyronine (T3).  The most common cause is Hashimoto's disease, a disease in which the body's disease-fighting system (immune system) attacks the thyroid gland. The condition can also be caused by viral infections, medicine, pregnancy, or past   radiation treatment to the head or neck.  Symptoms may include weight gain, dry skin, constipation, feeling as though you do not have energy, and not being able to tolerate cold.  This condition is treated with medicine to replace the thyroid hormones that your body does not make. This information  is not intended to replace advice given to you by your health care provider. Make sure you discuss any questions you have with your health care provider. Document Released: 02/06/2005 Document Revised: 01/19/2017 Document Reviewed: 01/17/2017 Elsevier Patient Education  2020 [employer]  

## 2018-11-26 NOTE — Progress Notes (Signed)
Subjective:  Patient ID: Jonathan Powers, male    DOB: February 22, 1950  Age: 68 y.o. MRN: [id number]  CC: Hypothyroidism and Diarrhea   HPI Jonathan Powers presents for f/up - He was admitted 2 weeks ago for cellulitis over his left lower leg.  He has completed the antibiotics and tells me the area feels normal now.  The redness, pain, and swelling have resolved.  He denies fever or chills.  He has had diarrhea for the last week or 2.  Outpatient Medications Prior to Visit  Medication Sig Dispense Refill   acetaminophen (TYLENOL) 500 MG tablet Take 1,000 mg by mouth 2 (two) times daily.     allopurinol (ZYLOPRIM) 100 MG tablet Take 1 tablet (100 mg total) by mouth daily for 7 days, THEN 2 tablets (200 mg total) daily for 7 days, THEN 3 tablets (300 mg total) daily for 14 days. 63 tablet 0   amLODipine (NORVASC) 2.5 MG tablet Take 1 tablet (2.5 mg total) by mouth daily. 30 tablet 2   cetirizine (ZYRTEC) 10 MG tablet Take 10 mg by mouth daily.     Cholecalciferol (VITAMIN D-3) 5000 UNITS TABS Take 5,000 Units by mouth daily.      diclofenac sodium (VOLTAREN) 1 % GEL 2-4 grams qid as needed for pain as discussed in office 10 Tube 12   esomeprazole (NEXIUM) 20 MG capsule Take 20 mg by mouth daily as needed (for acid reflux).     fluocinonide-emollient (LIDEX-E) 0.05 % cream Apply 1 application topically 2 (two) times daily. 60 g 3   hydrocortisone-pramoxine (PROCTOFOAM HC) rectal foam Place 1 applicator rectally 2 (two) times daily. 10 g 0   levothyroxine (SYNTHROID) 175 MCG tablet Take 1 tablet (175 mcg total) by mouth daily before breakfast. 90 tablet 0   methocarbamol (ROBAXIN) 500 MG tablet TAKE ONE TABLET BY MOUTH DAILY AT BEDTIME  (Patient taking differently: Take 500 mg by mouth every 8 (eight) hours as needed for muscle spasms. ) 30 tablet 0   ondansetron (ZOFRAN-ODT) 8 MG disintegrating tablet Take 1 tablet (8 mg total) by mouth every 8 (eight) hours as needed for nausea or  vomiting. 20 tablet 0   rosuvastatin (CRESTOR) 5 MG tablet TAKE ONE TABLET BY MOUTH ONE TIME DAILY  (Patient taking differently: Take 5 mg by mouth at bedtime. ) 90 tablet 1   telmisartan (MICARDIS) 40 MG tablet TAKE ONE TABLET BY MOUTH ONE TIME DAILY  (Patient taking differently: Take 40 mg by mouth daily. ) 90 tablet 0   Teriparatide, Recombinant, 600 MCG/2.4ML SOLN Inject 0.08 mLs (20 mcg total) into the skin daily. 2.24 mL 5   traMADol (ULTRAM) 50 MG tablet Take 1 tablet (50 mg total) by mouth 3 (three) times daily as needed. (Patient taking differently: Take 50 mg by mouth at bedtime. ) 60 tablet 0   traZODone (DESYREL) 100 MG tablet Take 0.5-1 tablets (50-100 mg total) by mouth at bedtime as needed for sleep. (Patient taking differently: Take 50 mg by mouth at bedtime. ) 90 tablet 1   valACYclovir (VALTREX) 1000 MG tablet Take 1 tablet (1,000 mg total) by mouth 2 (two) times daily. 14 tablet 5   No facility-administered medications prior to visit.     ROS Review of Systems  Constitutional: Negative for chills, fatigue and fever.  HENT: Negative.   Eyes: Negative for visual disturbance.  Respiratory: Negative for cough, chest tightness, shortness of breath and wheezing.   Cardiovascular: Negative for chest pain, palpitations  and leg swelling.  Gastrointestinal: Negative for abdominal pain, constipation, diarrhea, nausea and vomiting.  Endocrine: Negative.   Genitourinary: Negative for difficulty urinating.  Musculoskeletal: Positive for arthralgias. Negative for myalgias.  Neurological: Negative.  Negative for dizziness, weakness and light-headedness.  Hematological: Negative for adenopathy. Does not bruise/bleed easily.  Psychiatric/Behavioral: Negative.     Objective:  BP 120/72 (BP Location: Left Arm, Patient Position: Sitting, Cuff Size: Large)    Pulse 60    Temp 98.6 F (37 C) (Oral)    Resp 16    Ht 5\' 10"  (1.778 m)    Wt 273 lb (123.8 kg)    SpO2 97%    BMI 39.17 kg/m    BP Readings from Last 3 Encounters:  11/26/18 120/72  10/10/18 (!) 149/76  07/22/18 131/62    Wt Readings from Last 3 Encounters:  11/26/18 273 lb (123.8 kg)  10/10/18 267 lb (121.1 kg)  07/22/18 285 lb (129.3 kg)    Physical Exam Vitals signs reviewed.  Constitutional:      Appearance: He is obese. He is not ill-appearing or diaphoretic.  HENT:     Nose: Nose normal.     Mouth/Throat:     Mouth: Mucous membranes are moist.  Eyes:     Conjunctiva/sclera: Conjunctivae normal.  Neck:     Musculoskeletal: Normal range of motion.  Cardiovascular:     Rate and Rhythm: Normal rate and regular rhythm.     Heart sounds: No murmur.  Pulmonary:     Effort: Pulmonary effort is normal.     Breath sounds: No wheezing, rhonchi or rales.  Abdominal:     General: Abdomen is protuberant. Bowel sounds are normal. There is no distension.     Palpations: There is no hepatomegaly, splenomegaly or mass.  Musculoskeletal: Normal range of motion.     Right lower leg: No edema.     Left lower leg: No edema.       Legs:  Lymphadenopathy:     Cervical: No cervical adenopathy.  Skin:    General: Skin is warm and dry.     Findings: No rash.  Neurological:     Mental Status: He is alert.     Lab Results  Component Value Date   WBC 6.8 11/19/2018   HGB 15.7 11/19/2018   HCT 45.9 11/19/2018   PLT 296 11/19/2018   GLUCOSE 69 11/19/2018   CHOL 108 11/19/2018   TRIG 235 (H) 11/19/2018   HDL 39 (L) 11/19/2018   LDLCALC 40 11/19/2018   ALT 25 11/19/2018   AST 25 11/19/2018   NA 138 11/19/2018   K 4.0 11/19/2018   CL 103 11/19/2018   CREATININE 1.20 11/19/2018   BUN 11 11/19/2018   CO2 25 11/19/2018   TSH 1.18 07/09/2018   PSA 0.5 11/19/2018   INR 2.4 (H) 07/29/2006   HGBA1C 5.1 11/09/2018    Ct Coronary Morph W/cta Cor W/score W/ca W/cm &/or Wo/cm  Addendum Date: 11/11/2018   ADDENDUM REPORT: 11/11/2018 21:46 HISTORY: 68 yo male with exertional chest pain and dyspnea EXAM:  Cardiac/Coronary CTA TECHNIQUE: The patient was scanned on a [employer]. PROTOCOL: A 120 kV prospective scan was triggered in the descending thoracic aorta at 111 HU's. Axial non-contrast 3 mm slices were carried out through the heart. The data set was analyzed on a dedicated work station and scored using the [employer]. Gantry rotation speed was 250 msecs and collimation was .6 mm. Beta blockade and 0.8 mg  of sl NTG was given. The 3D data set was reconstructed in 5% intervals of the 67-82 % of the R-R cycle. Diastolic phases were analyzed on a dedicated work station using MPR, MIP and VRT modes. The patient received 128mL OMNIPAQUE IOHEXOL 350 MG/ML SOLN of contrast. FINDINGS: Quality: Excellent Coronary calcium score: The patient's coronary artery calcium score is 250.78, which places the patient in the 66th percentile. Coronary arteries: Normal coronary origins.  Right dominance. Right Coronary Artery: Dominant vessel. Large conus branch. Minimal 1-24% proximal mixed stenosis (CADRADS1). Left Main Coronary Artery: No significant obstruction. Left Anterior Descending Coronary Artery: Large vessel which gives off 2 moderate sized diagonal branches. There is minimal mixed 1-24% stenosis (CADRADS1) of the proximal to mid-LAD. Calcium is noted at the ostium of a small (<2.0 mm) D2 branch without significant obstruction. Left Circumflex Artery: Large caliber vessel which gives off a large OM1 branch and several smaller distal OM2 branches. There is minimal 1-24% stenosis (CADRADS1) at the LCx/OM1 bifurcation and seen in the ostial OM1. Aorta: Normal size, 33 mm at the mid ascending aorta (level of the PA bifurcation) measured double oblique. Atherosclerotic calcification noted. No dissection. Aortic Valve: Trileaflet.  No calcifications. Other findings: Normal pulmonary vein drainage into the left atrium. Normal left atrial appendage without a thrombus. Normal size of the pulmonary artery. IMPRESSION: 1.  Minimal non-obstructive CAD, CADRADS = 0. 2. Coronary calcium score of 250.78. This was 66th percentile for age and sex matched control. 3. Normal coronary origin with right dominance. 4. Atherosclerotic calcification of the aorta. Electronically Signed   By: Pixie Casino M.D.   On: 11/11/2018 21:46   Result Date: 11/11/2018 EXAM: OVER-READ INTERPRETATION  CT CHEST The following report is an over-read performed by radiologist Dr. Rolm Baptise of [facility], [place] on 11/11/2018. This over-read does not include interpretation of cardiac or coronary anatomy or pathology. The coronary CTA interpretation by the cardiologist is attached. COMPARISON:  None. FINDINGS: Vascular: Heart is normal size.  Aorta normal caliber. Mediastinum/Nodes: No adenopathy in   the lower mediastinum or hila. Lungs/Pleura: Visualized lungs clear.  No effusions. Upper Abdomen: Imaging into the upper abdomen shows no acute findings. Musculoskeletal: Chest wall soft tissues are unremarkable. No acute bony abnormality. IMPRESSION: No acute or significant extracardiac abnormality. Electronically Signed: By: Rolm Baptise M.D. On: 11/11/2018 15:53    Assessment & Plan:   [place] was seen today for hypothyroidism and diarrhea.  Diagnoses and all orders for this visit:  Essential hypertension- His blood pressure is adequately well controlled. -     CBC with Differential/Platelet; Future  Acquired hypothyroidism- His TSH is in the normal range.  I recommended he stay on the current dose of levothyroxine. -     TSH; Future  Cellulitis of left lower extremity- This has resolved. -     CBC with Differential/Platelet; Future  Diarrhea of presumed infectious origin- I will screen him for C. difficile infection.  If that result is negative then this is likely antibiotic associated diarrhea.  He was encouraged to take a probiotic. -     CBC with Differential/Platelet; Future -     C. Difficile Toxin; Future   I am having Jonathan Powers maintain his cetirizine, acetaminophen, Vitamin D-3, fluocinonide-emollient, Teriparatide (Recombinant), hydrocortisone-pramoxine, ondansetron, allopurinol, rosuvastatin, diclofenac sodium, traZODone, levothyroxine, valACYclovir, methocarbamol, amLODipine, traMADol, telmisartan, and esomeprazole.  No orders of the defined types were placed in this encounter.    Follow-up: No follow-ups on file.  Scarlette Calico,  MD

## 2018-11-27 ENCOUNTER — Ambulatory Visit (INDEPENDENT_AMBULATORY_CARE_PROVIDER_SITE_OTHER): Payer: Medicare Other | Admitting: Cardiology

## 2018-11-27 ENCOUNTER — Other Ambulatory Visit: Payer: Medicare Other

## 2018-11-27 ENCOUNTER — Encounter: Payer: Self-pay | Admitting: Cardiology

## 2018-11-27 VITALS — BP 121/66 | HR 57 | Temp 97.3°F | Ht 70.0 in | Wt 276.1 lb

## 2018-11-27 DIAGNOSIS — I10 Essential (primary) hypertension: Secondary | ICD-10-CM

## 2018-11-27 DIAGNOSIS — R0600 Dyspnea, unspecified: Secondary | ICD-10-CM

## 2018-11-27 DIAGNOSIS — R931 Abnormal findings on diagnostic imaging of heart and coronary circulation: Secondary | ICD-10-CM

## 2018-11-27 DIAGNOSIS — R0609 Other forms of dyspnea: Secondary | ICD-10-CM

## 2018-11-27 DIAGNOSIS — Z6837 Body mass index (BMI) 37.0-37.9, adult: Secondary | ICD-10-CM

## 2018-11-27 DIAGNOSIS — E6609 Other obesity due to excess calories: Secondary | ICD-10-CM

## 2018-11-27 DIAGNOSIS — E66812 Obesity, class 2: Secondary | ICD-10-CM

## 2018-11-27 DIAGNOSIS — E782 Mixed hyperlipidemia: Secondary | ICD-10-CM | POA: Diagnosis not present

## 2018-11-27 DIAGNOSIS — R197 Diarrhea, unspecified: Secondary | ICD-10-CM

## 2018-11-27 LAB — C.DIFFICILE TOXIN: C. Difficile Toxin A: DETECTED — AB

## 2018-11-27 NOTE — Progress Notes (Signed)
Primary Physician/Referring:  [name], MD  Referring Bo Merino, MD Patient ID: Jonathan Powers, male    DOB: January 29, 1951, 68 y.o.   MRN: [id number]  Chief Complaint  Patient presents with  . Chest Pain  . Shortness of Breath  . Follow-up    6 week   HPI:    Jonathan Powers  is a 68 y.o. Caucasian male with history of idiopathic gout, hypertension, obesity, obstructive sleep apnea on CPAP, mild hypertriglyceridemia, recently evaluated for worsening dyspnea on exertion and chest tightness.  He was started on amlodipine at his last office visit.  He was scheduled for echocardiogram, which was not performed, and coronary CTA and presents to discuss results.  Since last seen by [place], he has not had any further episodes of chest pain.  He has dyspnea on exertion with overexertion, but overall is not bothered by this.  His blood pressure has been stable with amlodipine, but does notice some increased fatigue with this.  No leg edema, no painful swelling of the lower extremities, no recent long travel.  Past Medical History:  Diagnosis Date  . ALLERGIC RHINITIS   . CELLULITIS, LEG, RIGHT    Recurrent R hip cellulitis 1/08,3/08   . Diverticulitis   . GERD (gastroesophageal reflux disease)   . Gout   . Hashimoto's thyroiditis   . Hypertension   . Hypogonadism male    low T, a/w ED  . Hypothyroid   . Left thyroid nodule 2008   on [place], decrease size in 2011 [place]  . Migraines    Atypical and ocular  . OSTEOARTHRITIS   . Seasonal allergies    Past Surgical History:  Procedure Laterality Date  . CARPAL TUNNEL RELEASE Left 03/30/2017   Procedure: CARPAL TUNNEL RELEASE;  Surgeon: Garald Balding, MD;  Location: [facility];  Service: Orthopedics;  Laterality: Left;  . CHOLECYSTECTOMY  2004  . CTR Bilateral 1982  . CYSTOSCOPY  1974  . FOOT FUSION Left 06/22/2017  . HEMORRHOID SURGERY N/A 03/30/2017   Procedure: HEMORRHOIDECTOMY;  Surgeon: Coralie Keens, MD;   Location: [facility];  Service: General;  Laterality: N/A;  . open reduction L little finger Left 1970  . Repair digital thumb, left Left 1990  . Right shoulder cuff repair Right 09/15/11  . Right shoulder SAD, DCR Right 02/05/11  . TOTAL HIP ARTHROPLASTY Left 07/26/06  . TOTAL HIP ARTHROPLASTY Right 1999   Social History   Socioeconomic History  . Marital status: Married    Spouse name: Not on file  . Number of children: 3  . Years of education: Not on file  . Highest education level: Not on file  Occupational History  . Occupation: Orthopedic PA    Employer: [employer]  . Financial resource strain: Not on file  . Food insecurity    Worry: Not on file    Inability: Not on file  . Transportation needs    Medical: Not on file    Non-medical: Not on file  Tobacco Use  . Smoking status: Never Smoker  . Smokeless tobacco: Never Used  Substance and Sexual Activity  . Alcohol use: Yes    Alcohol/week: 0.0 standard drinks    Comment: rare  . Drug use: No  . Sexual activity: Not on file  Lifestyle  . Physical activity    Days per week: Not on file    Minutes per session: Not on file  . Stress:  Not on file  Relationships  . Social Herbalist on phone: Not on file    Gets together: Not on file    Attends religious service: Not on file    Active member of club or organization: Not on file    Attends meetings of clubs or organizations: Not on file    Relationship status: Not on file  . Intimate partner violence    Fear of current or ex partner: Not on file    Emotionally abused: Not on file    Physically abused: Not on file    Forced sexual activity: Not on file  Other Topics Concern  . Not on file  Social History Narrative   ortho PA, married, lives with spouse   ROS  Review of Systems  Constitution: Positive for malaise/fatigue. Negative for chills, decreased appetite and weight gain.  Cardiovascular: Negative for chest pain,  dyspnea on exertion, leg swelling and syncope.  Endocrine: Negative for cold intolerance.  Hematologic/Lymphatic: Does not bruise/bleed easily.  Musculoskeletal: Positive for back pain, joint pain and neck pain. Negative for joint swelling.  Gastrointestinal: Negative for abdominal pain, anorexia, change in bowel habit, hematochezia and melena.  Neurological: Negative for headaches and light-headedness.  Psychiatric/Behavioral: Negative for depression and substance abuse.  All other systems reviewed and are negative.  Objective  Blood pressure 121/66, pulse (!) 57, temperature (!) 97.3 F (36.3 C), height 5\' 10"  (1.778 m), weight 276 lb 1.6 oz (125.2 kg), SpO2 96 %. Body mass index is 39.62 kg/m.   Physical Exam  Constitutional: He appears well-developed. No distress.  Moderately obese  HENT:  Head: Atraumatic.  Eyes: Conjunctivae are normal.  Neck: Neck supple. No thyromegaly present.  Short neck and difficult to evaluate JVP  Cardiovascular: Normal rate, regular rhythm and normal heart sounds. Exam reveals no gallop.  No murmur heard. Pulses:      Carotid pulses are 2+ on the right side and 2+ on the left side.      Dorsalis pedis pulses are 2+ on the right side and 2+ on the left side.       Posterior tibial pulses are 2+ on the right side and 2+ on the left side.  Femoral and popliteal pulse difficult to feel due to patient's body habitus.   Pulmonary/Chest: Effort normal and breath sounds normal.  Abdominal: Soft. Bowel sounds are normal.  Obese. Pannus present  Musculoskeletal: Normal range of motion.        General: No edema.  Neurological: He is alert.  Skin: Skin is warm and dry.  Psychiatric: He has a normal mood and affect.   Radiology:  Coronary CTA 11/11/2018:  1. Minimal non-obstructive CAD, CADRADS = 0.  2. Coronary calcium score of 250.78. This was 66th percentile for age and sex matched control.  3. Normal coronary origin with right dominance.  4.  Atherosclerotic calcification of the aorta.  Laboratory examination:   Recent Labs    11/10/18 0321 11/11/18 0539 11/19/18 0931  NA 137 139 138  K 3.9 4.1 4.0  CL 102 104 103  CO2 25 27 25   GLUCOSE 110* 99 69  BUN 11 10 11   CREATININE 1.00 1.04 1.20  CALCIUM 9.0 9.2 9.5  GFRNONAA >60 >60 62  GFRAA >60 >60 72   CMP Latest Ref Rng & Units 11/19/2018 11/11/2018 11/10/2018  Glucose 65 - 99 mg/dL 69 99 110(H)  BUN 7 - 25 mg/dL 11 10 11   Creatinine 0.70 - 1.25  mg/dL 1.20 1.04 1.00  Sodium 135 - 146 mmol/L 138 139 137  Potassium 3.5 - 5.3 mmol/L 4.0 4.1 3.9  Chloride 98 - 110 mmol/L 103 104 102  CO2 20 - 32 mmol/L 25 27 25   Calcium 8.6 - 10.3 mg/dL 9.5 9.2 9.0  Total Protein 6.1 - 8.1 g/dL 6.9 - -  Total Bilirubin 0.2 - 1.2 mg/dL 0.6 - -  Alkaline Phos 38 - 126 U/L - - -  AST 10 - 35 U/L 25 - -  ALT 9 - 46 U/L 25 - -   CBC Latest Ref Rng & Units 11/26/2018 11/19/2018 11/11/2018  WBC 4.0 - 10.5 K/uL 5.6 6.8 7.1  Hemoglobin 13.0 - 17.0 g/dL 14.4 15.7 14.4  Hematocrit 39.0 - 52.0 % 42.2 45.9 43.8  Platelets 150.0 - 400.0 K/uL 304.0 296 289   Lipid Panel     Component Value Date/Time   CHOL 108 11/19/2018 0931   TRIG 235 (H) 11/19/2018 0931   HDL 39 (L) 11/19/2018 0931   CHOLHDL 2.8 11/19/2018 0931   VLDL 35.6 02/05/2018 1629   LDLCALC 40 11/19/2018 0931   HEMOGLOBIN A1C Lab Results  Component Value Date   HGBA1C 5.1 11/09/2018   MPG 99.67 11/09/2018   TSH Recent Labs    02/05/18 1629 07/09/18 1334 11/26/18 1506  TSH 1.36 1.18 1.22   Medications   Prior to Admission medications   Medication Sig Start Date End Date Taking? Authorizing Provider  acetaminophen (TYLENOL) 500 MG tablet Take 1,000 mg by mouth 2 (two) times daily.   Yes [provider]  cetirizine (ZYRTEC) 10 MG tablet Take 10 mg by mouth daily.   Yes [provider]  Cholecalciferol (VITAMIN D-3) 5000 UNITS TABS Take by mouth. Takes Monday thru friday   Yes [provider]   diclofenac sodium (VOLTAREN) 1 % GEL 2-4 grams qid as needed for pain as discussed in office 07/22/18  Yes Ofilia Neas, PA-C  allopurinol (ZYLOPRIM) 100 MG tablet Take 1 tablet (100 mg total) by mouth daily for 7 days, THEN 2 tablets (200 mg total) daily for 7 days, THEN 3 tablets (300 mg total) daily for 14 days. 07/10/18 08/07/18  Bo Merino, MD  Esomeprazole Magnesium (NEXIUM 24HR PO) Take 1 tablet by mouth daily as needed.     [provider]  fluocinonide-emollient (LIDEX-E) 0.05 % cream Apply 1 application topically 2 (two) times daily. 07/24/17   [name], MD  hydrocortisone-pramoxine (PROCTOFOAM [facility]) rectal foam Place 1 applicator rectally 2 (two) times daily. 04/05/18   Marrian Salvage, FNP  levothyroxine (SYNTHROID) 175 MCG tablet Take 1 tablet (175 mcg total) by mouth daily before breakfast. 09/23/18   [name], MD  methocarbamol (ROBAXIN) 500 MG tablet TAKE ONE TABLET BY MOUTH DAILY AT BEDTIME  09/30/18   Garald Balding, MD  ondansetron (ZOFRAN-ODT) 8 MG disintegrating tablet Take 1 tablet (8 mg total) by mouth every 8 (eight) hours as needed for nausea or vomiting. 04/05/18   Marrian Salvage, FNP  rosuvastatin (CRESTOR) 5 MG tablet TAKE ONE TABLET BY MOUTH ONE TIME DAILY  07/12/18   [name], MD  telmisartan (MICARDIS) 40 MG tablet Take 1 tablet (40 mg total) by mouth daily. 05/18/18   [name], MD  Teriparatide, Recombinant, 600 MCG/2.4ML SOLN Inject 0.08 mLs (20 mcg total) into the skin daily. 03/11/18   [name], MD  traMADol (ULTRAM) 50 MG tablet Take 1 tablet (50 mg  total) by mouth 3 (three) times daily as needed. 09/06/18   Bo Merino, MD  traZODone (DESYREL) 100 MG tablet Take 0.5-1 tablets (50-100 mg total) by mouth at bedtime as needed for sleep. 08/10/18   [name], MD  valACYclovir (VALTREX) 1000 MG tablet Take 1 tablet (1,000 mg total) by mouth 2 (two) times daily. 09/23/18   [name], MD     Current  Outpatient Medications  Medication Instructions  . acetaminophen (TYLENOL) 1,000 mg, Oral, 2 times daily  . amLODipine (NORVASC) 2.5 mg, Oral, Daily  . cetirizine (ZYRTEC) 10 mg, Daily  . diclofenac sodium (VOLTAREN) 1 % GEL 2-4 grams qid as needed for pain as discussed in office  . esomeprazole (NEXIUM) 20 mg, Oral, Daily PRN  . hydrocortisone-pramoxine (PROCTOFOAM HC) rectal foam 1 applicator, Rectal, 2 times daily  . levothyroxine (SYNTHROID) 175 mcg, Oral, Daily before breakfast  . methocarbamol (ROBAXIN) 500 MG tablet TAKE ONE TABLET BY MOUTH DAILY AT BEDTIME   . ondansetron (ZOFRAN ODT) 8 mg, Oral, Every 8 hours PRN  . rosuvastatin (CRESTOR) 5 MG tablet TAKE ONE TABLET BY MOUTH ONE TIME DAILY   . telmisartan (MICARDIS) 40 MG tablet TAKE ONE TABLET BY MOUTH ONE TIME DAILY   . traMADol (ULTRAM) 50 mg, Oral, 3 times daily PRN  . traZODone (DESYREL) 50-100 mg, Oral, At bedtime PRN  . valACYclovir (VALTREX) 1,000 mg, Oral, 2 times daily  . Vitamin D-3 5,000 Units, Oral, Daily    Cardiac Studies:   Lexiscan Myoview stress test 09/30/2012: No evidence of ischemia.  Normal   LVEF.  Echocardiogram 09/11/2012: Normal LVEF, mild LVH.  Left atrium mildly dilated.  Assessment     ICD-10-CM   1. Essential hypertension  I10   2. Dyspnea on exertion  R06.00   3. Agatston CAC score 200-399  R93.1   4. Mixed hyperlipidemia  E78.2   5. Class 2 obesity due to excess calories without serious comorbidity with body mass index (BMI) of 37.0 to 37.9 in adult  E66.09    Z68.37     EKG 10/10/2018: Normal sinus rhythm at the rate of 66 bpm, normal axis.  No evidence of ischemia, normal EKG.  Recommendations:   I discussed recently obtain coronary CTA that shows very minimal nonobstructive CAD.  A Gaston score noted to be 258.  I recommended continued aggressive risk factor modification.  He has not had any further episodes of chest discomfort and dyspnea on exertion has overall been stable.  Suspect  is related to deconditioning and his weight.  Previously his weight was recorded as 268, which he states was an error.  He is actually had approximately 10 pound weight loss.  Encouraged him to continue with diet modifications in view of hypertriglyceridemia.  Lipids are very well controlled with low-dose Crestor.    Blood pressure has significantly improved with addition of amlodipine, he does mention some fatigue with this.  We will continue with amlodipine for now, if he continues to lose weight, can potentially try coming off of amlodipine.  He was scheduled for echocardiogram but was previously ordered, but not completed for evaluation of LVEF and pulmonary hypertension.  I will send him a message regarding those results.  I would like to see him back in 6 months for follow-up on his efforts towards weight loss and also hypertension.  Encouraged him to contact us sooner if needed.  Miquel Dunn, MSN, APRN, FNP-C [facility]. [place] Office: [phone] Fax: [phone]

## 2018-11-28 ENCOUNTER — Encounter: Payer: Self-pay | Admitting: Internal Medicine

## 2018-11-28 ENCOUNTER — Other Ambulatory Visit: Payer: Self-pay | Admitting: Internal Medicine

## 2018-11-28 DIAGNOSIS — A0472 Enterocolitis due to Clostridium difficile, not specified as recurrent: Secondary | ICD-10-CM | POA: Insufficient documentation

## 2018-11-28 MED ORDER — VANCOMYCIN HCL 125 MG PO CAPS: 125.0000 mg | ORAL_CAPSULE | Freq: Four times a day (QID) | ORAL | 0 refills | Status: AC

## 2018-11-29 ENCOUNTER — Other Ambulatory Visit: Payer: Self-pay | Admitting: Internal Medicine

## 2018-11-29 DIAGNOSIS — B002 Herpesviral gingivostomatitis and pharyngotonsillitis: Secondary | ICD-10-CM

## 2018-12-27 ENCOUNTER — Other Ambulatory Visit: Payer: Self-pay

## 2018-12-27 ENCOUNTER — Ambulatory Visit (INDEPENDENT_AMBULATORY_CARE_PROVIDER_SITE_OTHER): Payer: Medicare Other

## 2018-12-27 DIAGNOSIS — R0609 Other forms of dyspnea: Secondary | ICD-10-CM

## 2018-12-27 DIAGNOSIS — R072 Precordial pain: Secondary | ICD-10-CM | POA: Diagnosis not present

## 2018-12-27 DIAGNOSIS — R0600 Dyspnea, unspecified: Secondary | ICD-10-CM

## 2018-12-29 ENCOUNTER — Other Ambulatory Visit: Payer: Self-pay | Admitting: Cardiology

## 2018-12-29 DIAGNOSIS — I10 Essential (primary) hypertension: Secondary | ICD-10-CM

## 2018-12-29 IMAGING — CT CT CTA ABD/PEL W/CM AND/OR W/O CM
2 of 6 series · 15 of 46 positions shown, 17 images · IV contrast (iopamidol)
Comparison: 07/23/2015 MRA and previous

CLINICAL DATA: Left upper abdominal pain x1 month, history of SMA
aneurysm

EXAM:
CTA ABDOMEN AND PELVIS WITH CONTRAST
TECHNIQUE: Multidetector CT imaging of the abdomen and pelvis was performed
using the standard protocol during bolus administration of
intravenous contrast. Multiplanar reconstructed images and MIPs were
obtained and reviewed to evaluate the vascular anatomy.
CONTRAST:  100mL [id number]-TXY IOPAMIDOL ([id number]-TXY) INJECTION 76%

[Series 4: arterial 3.0 i40f 2 · axial · arterial · 0.77mm/px · z∈[+898,+1333]mm · 12 of 173 slices shown, 14 images]
[im 14/173  soft-tissue]
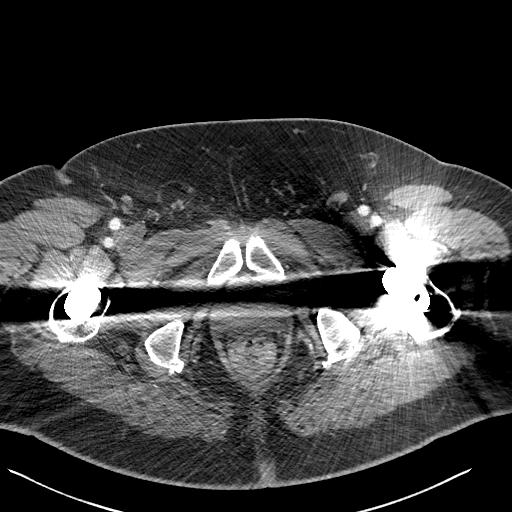
[im 14/173  bone]
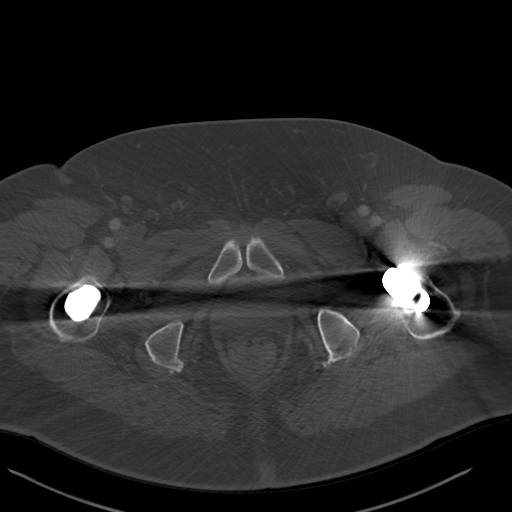
[im 27/173  soft-tissue]
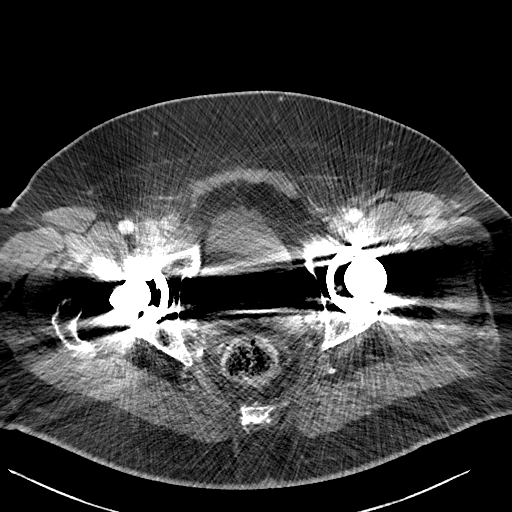
[im 40/173  soft-tissue]
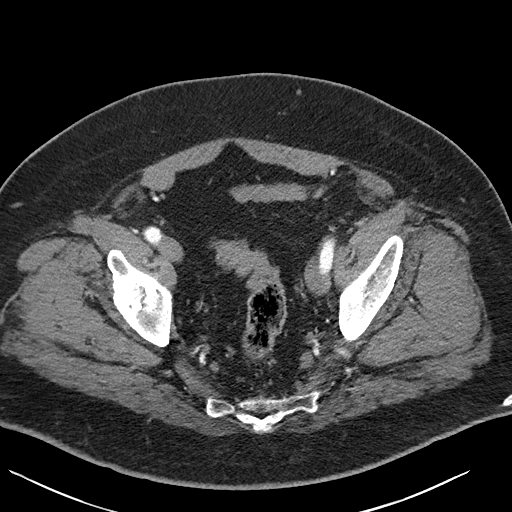
[im 53/173  soft-tissue]
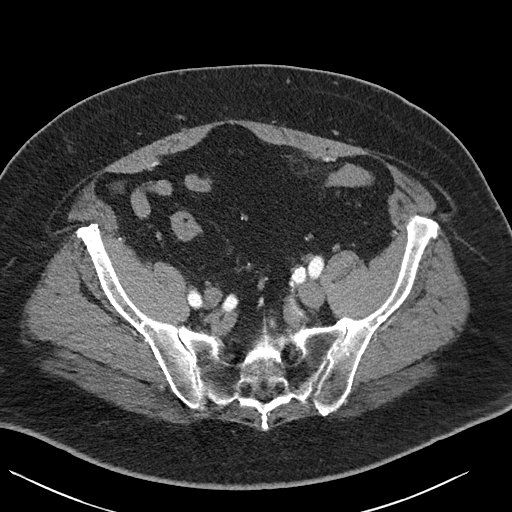
[im 67/173  soft-tissue]
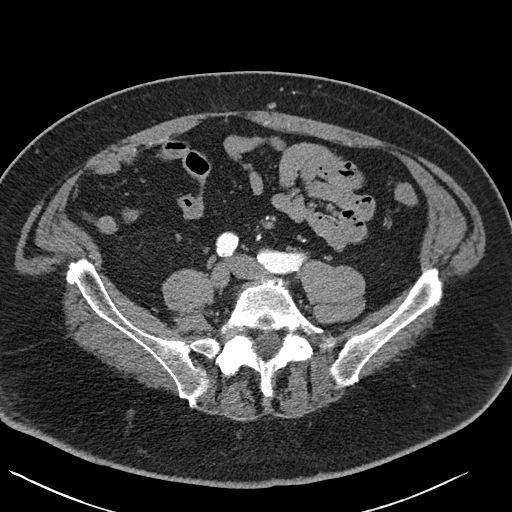
[im 80/173  soft-tissue]
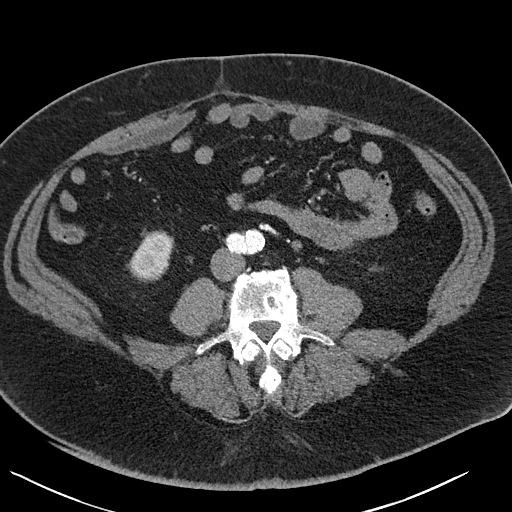
[im 93/173  soft-tissue]
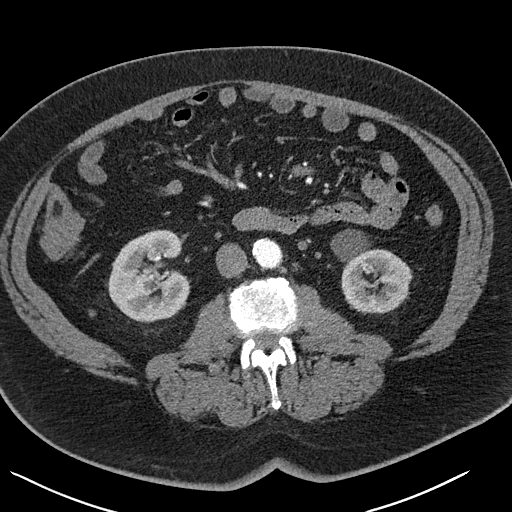
[im 106/173  soft-tissue]
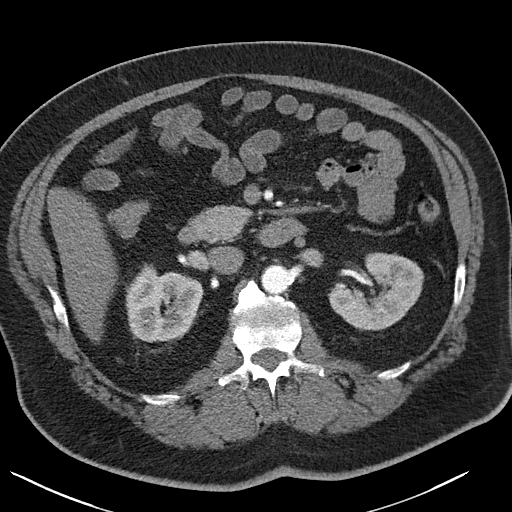
[im 120/173  soft-tissue]
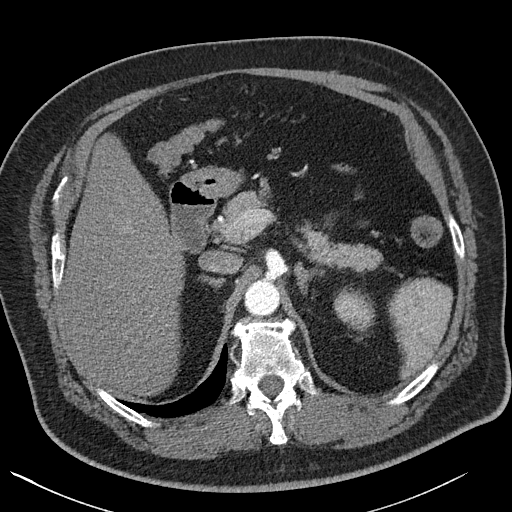
[im 120/173  bone]
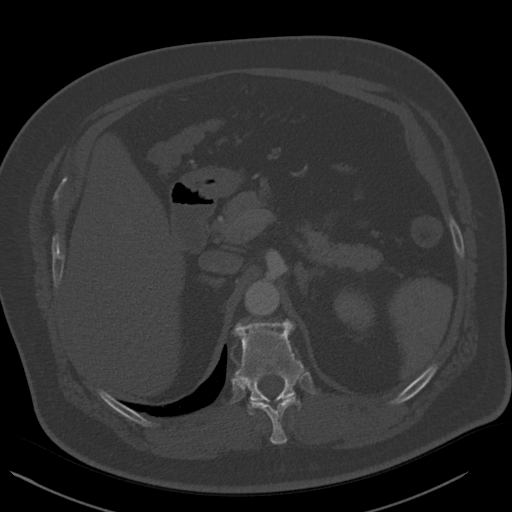
[im 133/173  soft-tissue]
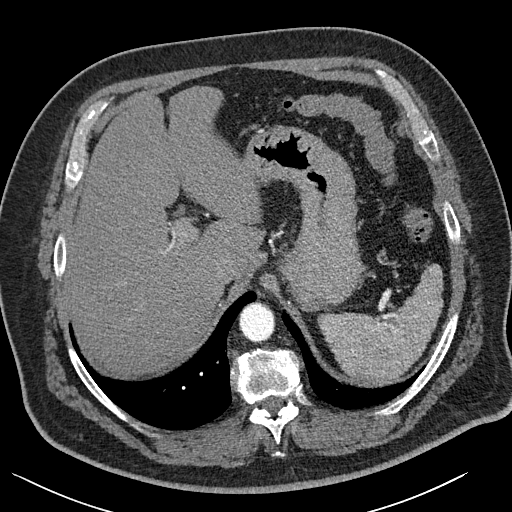
[im 146/173  soft-tissue]
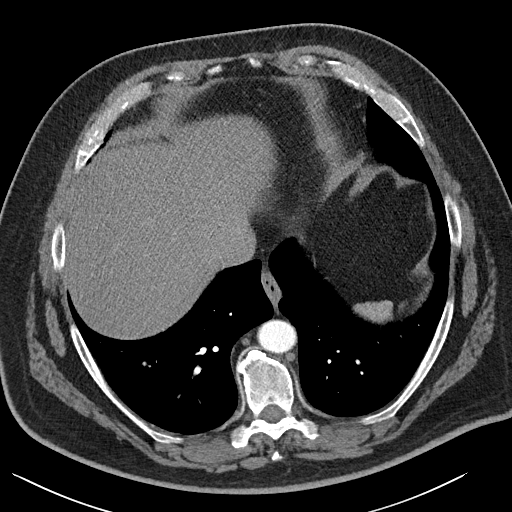
[im 159/173  soft-tissue]
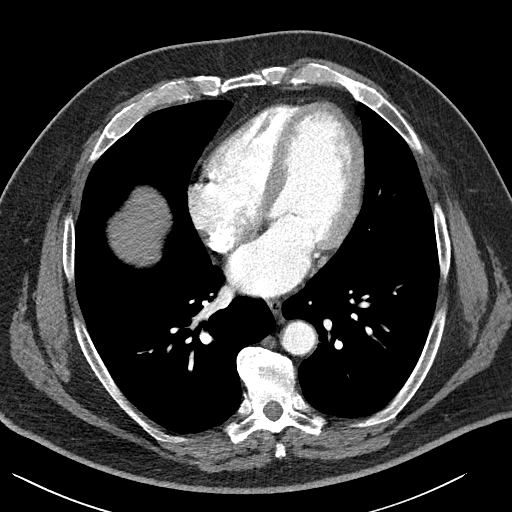

[Series 6: coronals · coronal · 0.78mm/px · 3 of 151 slices shown]
[im 38/151  soft-tissue]
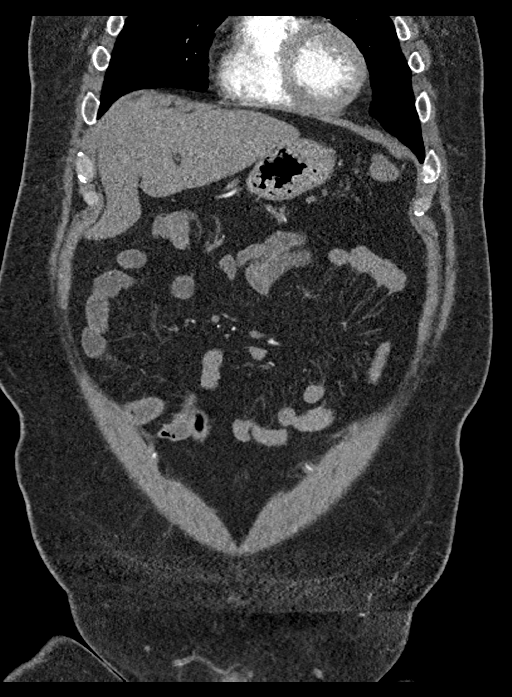
[im 76/151  soft-tissue]
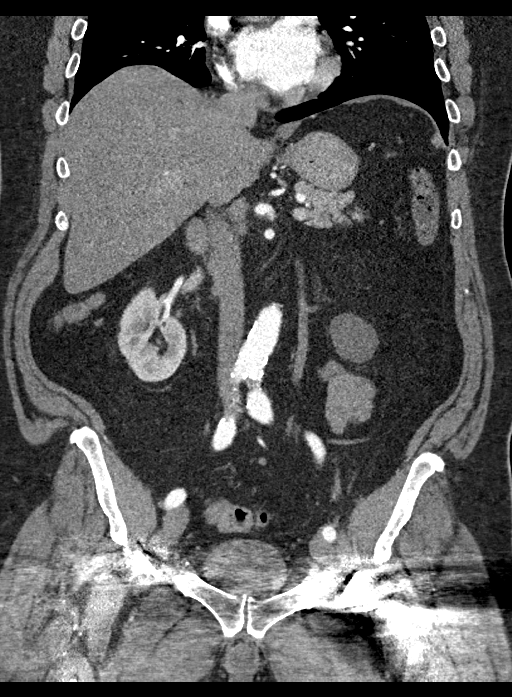
[im 113/151  soft-tissue]
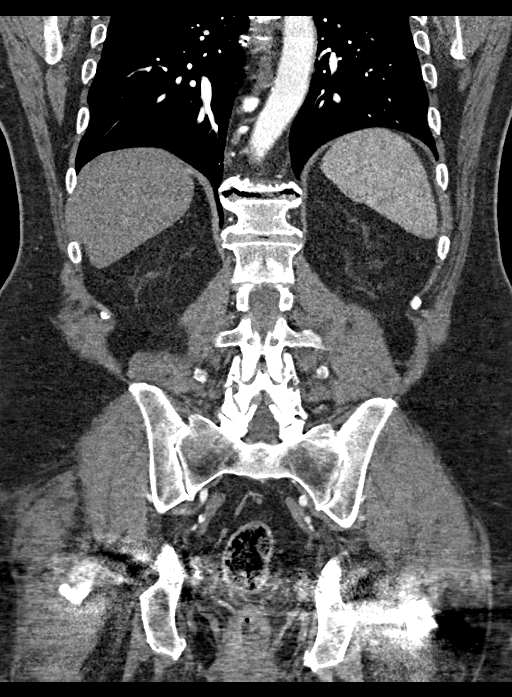

[15 of 46 positions shown; findings below may reference images not displayed]

FINDINGS: VASCULAR

Aorta: Mild tortuosity. Mild ectasia with calcified atheromatous
plaque in the infrarenal segment. No aneurysm, dissection, or
stenosis.

Celiac: No high-grade stenosis. Fusiform dilatation distally up to
1.4 cm diameter. Distal branching unremarkable.

SMA: Patent without evidence of aneurysm, dissection, vasculitis or
significant stenosis.

Renals: Both have nonocclusive calcified ostial plaque, patent
distally.

IMA: Patent without evidence of aneurysm, dissection, vasculitis or
significant stenosis.

Inflow: Left common iliac artery is ectatic up to 1.8 cm diameter.
Scattered calcified plaque in the iliac arterial systems. No
dissection or stenosis. Mild tortuosity.

Proximal Outflow: Bilateral common femoral and visualized portions
of the superficial and profunda femoral arteries are patent without
evidence of aneurysm, dissection, vasculitis or significant
stenosis.

Veins: No obvious venous abnormality within the limitations of this
arterial phase study.

Review of the MIP images confirms the above findings.

NON-VASCULAR

Lower chest: No acute abnormality.

Hepatobiliary: No focal liver abnormality is seen. Status post
cholecystectomy. No biliary dilatation.

Pancreas: Unremarkable. No pancreatic ductal dilatation or
surrounding inflammatory changes.

Spleen: Normal in size without focal abnormality.

Adrenals/Urinary Tract: Normal adrenals. Unremarkable right kidney.
No hydronephrosis. 3.8 cm exophytic cyst from the lower pole left
kidney, stable. Urinary bladder incompletely distended, imaging
somewhat degraded by streak artifact from hip arthroplasty hardware.

Stomach/Bowel: Stomach and small bowel decompressed. Normal
appendix. The colon is nondilated. A few sigmoid diverticula without
adjacent inflammatory/edematous change or abscess.

Lymphatic: No abdominal or pelvic adenopathy.

Reproductive: Prostate is unremarkable.

Other: No ascites. Bilateral pelvic phleboliths. No free air.

Musculoskeletal: Bilateral hip arthroplasty hardware is incompletely
visualized, unremarkable. Spondylitic changes in the lower thoracic
and lumbar spine. No fracture or worrisome bone lesion.
IMPRESSION: VASCULAR

1. No acute findings.
2. Stable 1.4 cm fusiform dilatation of celiac trunk.
3. Ectatic abdominal aorta at risk for aneurysm development.
Recommend followup by ultrasound in 5 years. This recommendation
follows ACR consensus guidelines: White Paper of the ACR Incidental
Findings Committee II on Vascular Findings. [HOSPITAL] 1152;
[DATE].
4. 1.8 cm fusiform dilatation of  left common iliac artery.

NON-VASCULAR

1. No acute findings.
2. Sigmoid diverticulosis.

## 2019-01-03 ENCOUNTER — Other Ambulatory Visit: Payer: Self-pay

## 2019-01-03 MED ORDER — TRAMADOL HCL 50 MG PO TABS: 50.0000 mg | ORAL_TABLET | Freq: Three times a day (TID) | ORAL | 0 refills | Status: DC | PRN

## 2019-01-03 NOTE — Telephone Encounter (Signed)
Last Visit: 07/10/2018 Next Visit: patient will schedule.  UDS: 10/11/2018 Narc Agreement: 10/11/2018  Last fill: 11/06/2018  Okay to refill tramadol?

## 2019-01-08 DIAGNOSIS — M2141 Flat foot [pes planus] (acquired), right foot: Secondary | ICD-10-CM | POA: Diagnosis not present

## 2019-01-08 DIAGNOSIS — M79672 Pain in left foot: Secondary | ICD-10-CM | POA: Diagnosis not present

## 2019-01-08 DIAGNOSIS — M79671 Pain in right foot: Secondary | ICD-10-CM | POA: Diagnosis not present

## 2019-01-08 DIAGNOSIS — M2142 Flat foot [pes planus] (acquired), left foot: Secondary | ICD-10-CM | POA: Diagnosis not present

## 2019-01-08 DIAGNOSIS — M25572 Pain in left ankle and joints of left foot: Secondary | ICD-10-CM | POA: Diagnosis not present

## 2019-01-09 ENCOUNTER — Telehealth: Payer: Self-pay

## 2019-01-09 NOTE — Telephone Encounter (Signed)
Patient calling for Echo results 

## 2019-01-10 NOTE — Telephone Encounter (Signed)
I discussed findings of the echocardiogram and mild diastolic dysfunction.  Encouraged him to continue to lose weight.  Do not see any contraindication for him to go through any surgical procedure.  Patient voiced understanding.

## 2019-01-15 ENCOUNTER — Other Ambulatory Visit: Payer: Self-pay

## 2019-01-15 ENCOUNTER — Ambulatory Visit (HOSPITAL_COMMUNITY)
Admission: EM | Admit: 2019-01-15 | Discharge: 2019-01-15 | Disposition: A | Discharge status: Home / Self Care | Payer: Medicare Other | Attending: Family Medicine | Admitting: Family Medicine

## 2019-01-15 ENCOUNTER — Ambulatory Visit (INDEPENDENT_AMBULATORY_CARE_PROVIDER_SITE_OTHER)
Admission: RE | Admit: 2019-01-15 | Discharge: 2019-01-15 | Disposition: A | Discharge status: Home / Self Care | Payer: Medicare Other | Source: Ambulatory Visit

## 2019-01-15 DIAGNOSIS — B9789 Other viral agents as the cause of diseases classified elsewhere: Secondary | ICD-10-CM

## 2019-01-15 DIAGNOSIS — Z20828 Contact with and (suspected) exposure to other viral communicable diseases: Secondary | ICD-10-CM

## 2019-01-15 DIAGNOSIS — J029 Acute pharyngitis, unspecified: Secondary | ICD-10-CM

## 2019-01-15 DIAGNOSIS — J3489 Other specified disorders of nose and nasal sinuses: Secondary | ICD-10-CM | POA: Diagnosis not present

## 2019-01-15 DIAGNOSIS — Z20822 Contact with and (suspected) exposure to covid-19: Secondary | ICD-10-CM

## 2019-01-15 DIAGNOSIS — J329 Chronic sinusitis, unspecified: Secondary | ICD-10-CM

## 2019-01-15 LAB — POC SARS CORONAVIRUS 2 AG -  ED: SARS Coronavirus 2 Ag: NEGATIVE

## 2019-01-15 LAB — POC SARS CORONAVIRUS 2 AG: SARS Coronavirus 2 Ag: NEGATIVE

## 2019-01-15 MED ORDER — PREDNISONE 20 MG PO TABS: 20.0000 mg | ORAL_TABLET | Freq: Two times a day (BID) | ORAL | 0 refills | Status: AC

## 2019-01-15 MED ORDER — DOXYCYCLINE HYCLATE 100 MG PO CAPS: 100.0000 mg | ORAL_CAPSULE | Freq: Two times a day (BID) | ORAL | 0 refills | Status: DC

## 2019-01-15 NOTE — ED Triage Notes (Signed)
Pt had video visit w/ [name], PA for rapid covid test.

## 2019-01-15 NOTE — ED Provider Notes (Addendum)
[name]    Virtual Visit via Video Note:  DUFF FRANCHINI  initiated request for Telemedicine visit with [name] team. I connected with [name]  on 01/15/2019 at 7:07 PM  for a synchronized telemedicine visit using a video enabled HIPPA compliant telemedicine application. I verified that I am speaking with [name]  using two identifiers. [name], PA-C  was physically located in a [facility]gent care site and CHEYNE KRAEMER was located at a different location.   The limitations of evaluation and management by telemedicine as well as the availability of in-person appointments were discussed. Patient was informed that he  may incur a bill ( including co-pay) for this virtual visit encounter. [name]  expressed understanding and gave verbal consent to proceed with virtual visit.  [id number] 01/15/19 Arrival Time: 1842  Cc: URI/ sinusitis  SUBJECTIVE:  Jonathan Powers is a 68 y.o. male who presents with maxillary and frontal sinus pressure/pain, and mild sore throat x 48 hours.  Denies sick exposure to COVID, flu or strep.  Denies recent travel.  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   Has tried afrin and flonase with temporary relief.  Denies aggravating factors.  Reports previous symptoms in the past related to sinusitis and was treated with antibiotics.   Denies fever, chills, fatigue,  rhinorrhea, cough, SOB, wheezing, chest pain, nausea, vomiting, changes in bowel or bladder habits.    ROS: As per HPI.  All other pertinent ROS negative.     Past Medical History:  Diagnosis Date  . ALLERGIC RHINITIS   . CELLULITIS, LEG, RIGHT    Recurrent R hip cellulitis 1/08,3/08   . Diverticulitis   . GERD (gastroesophageal reflux disease)   . Gout   . Hashimoto's thyroiditis   . Hypertension   . Hypogonadism male    low T, a/w ED  . Hypothyroid   . Left thyroid nodule 2008   on [place], decrease size in 2011 [place]  . Migraines     Atypical and ocular  . OSTEOARTHRITIS   . Seasonal allergies    Past Surgical History:  Procedure Laterality Date  . CARPAL TUNNEL RELEASE Left 03/30/2017   Procedure: CARPAL TUNNEL RELEASE;  Surgeon: Garald Balding, MD;  Location: [place];  Service: Orthopedics;  Laterality: Left;  . CHOLECYSTECTOMY  2004  . CTR Bilateral 1982  . CYSTOSCOPY  1974  . FOOT FUSION Left 06/22/2017  . HEMORRHOID SURGERY N/A 03/30/2017   Procedure: HEMORRHOIDECTOMY;  Surgeon: Coralie Keens, MD;  Location: [place];  Service: General;  Laterality: N/A;  . open reduction L little finger Left 1970  . Repair digital thumb, left Left 1990  . Right shoulder cuff repair Right 09/15/11  . Right shoulder SAD, DCR Right 02/05/11  . TOTAL HIP ARTHROPLASTY Left 07/26/06  . TOTAL HIP ARTHROPLASTY Right 1999   Allergies  Allergen Reactions  . Nsaids Shortness Of Breath    Naproxen specifically causes SOB/wheeze tolerates topical diclofenac without problem Tylenol OK  . Tolmetin Shortness Of Breath    Naproxen specifically causes SOB/wheeze tolerates topical diclofenac without problem Tylenol OK  . Penicillins Rash   No current facility-administered medications on file prior to encounter.    Current Outpatient Medications on File Prior to Encounter  Medication Sig Dispense Refill  . acetaminophen (TYLENOL) 500 MG tablet Take 1,000 mg by mouth 2 (two) times daily.    [name]  amLODipine (NORVASC) 2.5 MG tablet TAKE ONE TABLET BY MOUTH ONE TIME DAILY  30 tablet 0  . cetirizine (ZYRTEC) 10 MG tablet Take 10 mg by mouth daily.    . Cholecalciferol (VITAMIN D-3) 5000 UNITS TABS Take 5,000 Units by mouth daily.     . diclofenac sodium (VOLTAREN) 1 % GEL 2-4 grams qid as needed for pain as discussed in office 10 Tube 12  . esomeprazole (NEXIUM) 20 MG capsule Take 20 mg by mouth daily as needed (for acid reflux).    . hydrocortisone-pramoxine (PROCTOFOAM HC) rectal foam Place 1  applicator rectally 2 (two) times daily. 10 g 0  . levothyroxine (SYNTHROID) 175 MCG tablet Take 1 tablet (175 mcg total) by mouth daily before breakfast. 90 tablet 0  . methocarbamol (ROBAXIN) 500 MG tablet TAKE ONE TABLET BY MOUTH DAILY AT BEDTIME  (Patient taking differently: Take 500 mg by mouth every 8 (eight) hours as needed for muscle spasms. ) 30 tablet 0  . ondansetron (ZOFRAN-ODT) 8 MG disintegrating tablet Take 1 tablet (8 mg total) by mouth every 8 (eight) hours as needed for nausea or vomiting. 20 tablet 0  . rosuvastatin (CRESTOR) 5 MG tablet TAKE ONE TABLET BY MOUTH ONE TIME DAILY  (Patient taking differently: Take 5 mg by mouth at bedtime. ) 90 tablet 1  . telmisartan (MICARDIS) 40 MG tablet TAKE ONE TABLET BY MOUTH ONE TIME DAILY  (Patient taking differently: Take 40 mg by mouth daily. ) 90 tablet 0  . traMADol (ULTRAM) 50 MG tablet Take 1 tablet (50 mg total) by mouth 3 (three) times daily as needed. 90 tablet 0  . traZODone (DESYREL) 100 MG tablet Take 0.5-1 tablets (50-100 mg total) by mouth at bedtime as needed for sleep. (Patient taking differently: Take 50 mg by mouth at bedtime. ) 90 tablet 1  . valACYclovir (VALTREX) 1000 MG tablet TAKE ONE TABLET BY MOUTH TWICE DAILY  14 tablet 2      OBJECTIVE:  There were no vitals filed for this visit.   General appearance: alert; no distress Eyes: EOMI grossly HENT: normocephalic; atraumatic; localizes sinus tenderness to maxillary and frontal sinuses Neck: supple with FROM Lungs: normal respiratory effort; speaking in full sentences without difficulty Extremities: moves extremities without difficulty Skin: No obvious rashes Neurologic: No facial asymmetries Psychological: alert and cooperative; normal mood and affect  ASSESSMENT & PLAN:  1. Viral sinusitis   2. Encounter by telehealth for suspected COVID-19     Meds ordered this encounter  Medications  . predniSONE (DELTASONE) 20 MG tablet    Sig: Take 1 tablet (20 mg  total) by mouth 2 (two) times daily with a meal for 5 days.    Dispense:  10 tablet    Refill:  0    Order Specific Question:   Supervising Provider    Answer:   Raylene Everts [id number]  . doxycycline (VIBRAMYCIN) 100 MG capsule    Sig: Take 1 capsule (100 mg total) by mouth 2 (two) times daily.    Dispense:  20 capsule    Refill:  0    Order Specific Question:   Supervising Provider    Answer:   Raylene Everts [id number]   Go to [facility] to have a rapid COVID test.  If positive you will need to quarantine for 10 days from symptoms onset.   If negative, please remain in quarantine until you have received negative culture results.    Get plenty of rest and  push fluids Prednisone prescribed.  Take as directed to help with sinus congestion and swelling Doxycycline prescribed.  Please wait 2-3 days before filling medication.  Call or go to the ED if you have any new or worsening symptoms such as fever, cough, shortness of breath, chest tightness, chest pain, turning blue, changes in mental status, etc...   I discussed the assessment and treatment plan with the patient. The patient was provided an opportunity to ask questions and all were answered. The patient agreed with the plan and demonstrated an understanding of the instructions.   The patient was advised to call back or seek an in-person evaluation if the symptoms worsen or if the condition fails to improve as anticipated.  I provided 10 minutes of non-face-to-face time during this encounter.  [place], PA-C  01/15/2019 7:07 PM           Stacey Drain, [place], PA-C 01/15/19 [place], [place], PA-C 01/15/19 [id number]

## 2019-01-15 NOTE — Discharge Instructions (Signed)
Go to [facility] to have a rapid COVID test.  If positive you will need to quarantine for 10 days from symptoms onset.   If negative, please remain in quarantine until you have received negative culture results.    Get plenty of rest and push fluids Prednisone prescribed.  Take as directed to help with sinus congestion and swelling Doxycycline prescribed.  Please wait 2-3 days before filling medication.  Call or go to the ED if you have any new or worsening symptoms such as fever, cough, shortness of breath, chest tightness, chest pain, turning blue, changes in mental status, etc..[name]

## 2019-01-17 LAB — NOVEL CORONAVIRUS, NAA (HOSP ORDER, SEND-OUT TO REF LAB; TAT 18-24 HRS): SARS-CoV-2, NAA: NOT DETECTED

## 2019-01-23 DIAGNOSIS — M25552 Pain in left hip: Secondary | ICD-10-CM | POA: Diagnosis not present

## 2019-01-29 ENCOUNTER — Other Ambulatory Visit: Payer: Self-pay | Admitting: Internal Medicine

## 2019-01-29 DIAGNOSIS — E039 Hypothyroidism, unspecified: Secondary | ICD-10-CM

## 2019-01-30 ENCOUNTER — Other Ambulatory Visit: Payer: Self-pay | Admitting: Internal Medicine

## 2019-01-30 DIAGNOSIS — G4700 Insomnia, unspecified: Secondary | ICD-10-CM

## 2019-01-30 MED ORDER — LEVOTHYROXINE SODIUM 175 MCG PO TABS: 175.0000 ug | ORAL_TABLET | Freq: Every day | ORAL | 1 refills | Status: DC

## 2019-02-07 DIAGNOSIS — R948 Abnormal results of function studies of other organs and systems: Secondary | ICD-10-CM | POA: Diagnosis not present

## 2019-02-07 DIAGNOSIS — E349 Endocrine disorder, unspecified: Secondary | ICD-10-CM | POA: Diagnosis not present

## 2019-02-16 ENCOUNTER — Other Ambulatory Visit: Payer: Self-pay | Admitting: Internal Medicine

## 2019-02-16 DIAGNOSIS — E785 Hyperlipidemia, unspecified: Secondary | ICD-10-CM

## 2019-02-25 ENCOUNTER — Other Ambulatory Visit: Payer: Self-pay | Admitting: Orthopedic Surgery

## 2019-02-27 ENCOUNTER — Other Ambulatory Visit: Payer: Self-pay | Admitting: Cardiology

## 2019-02-27 ENCOUNTER — Encounter: Payer: Self-pay | Admitting: Cardiology

## 2019-02-27 ENCOUNTER — Other Ambulatory Visit: Payer: Self-pay | Admitting: Internal Medicine

## 2019-02-27 DIAGNOSIS — I10 Essential (primary) hypertension: Secondary | ICD-10-CM

## 2019-02-28 ENCOUNTER — Telehealth: Payer: Self-pay

## 2019-02-28 DIAGNOSIS — M25552 Pain in left hip: Secondary | ICD-10-CM | POA: Diagnosis not present

## 2019-02-28 NOTE — Telephone Encounter (Signed)
Copied from [place] [id number]. Topic: General - Inquiry >> Feb 28, 2019 12:20 PM Richardo Priest, [place] wrote: Reason for CRM: Wells Guiles with Dr.Rowan's office called in to inform office that fax for surgical clearance was sent in on 1/5, however pt's surgery has not been pushed up to 1/15. Please advise as they are needing this to be faxed back ASAP. Fax is [phone], attention Wells Guiles L.

## 2019-03-03 ENCOUNTER — Other Ambulatory Visit: Payer: Self-pay

## 2019-03-03 MED ORDER — TRAMADOL HCL 50 MG PO TABS: 50.0000 mg | ORAL_TABLET | Freq: Three times a day (TID) | ORAL | 0 refills | Status: DC | PRN

## 2019-03-03 NOTE — Telephone Encounter (Signed)
Can some call and have it sent to [phone]?  I do not know where they are trying to fax it to.

## 2019-03-03 NOTE — Telephone Encounter (Signed)
Last Visit: 07/10/2018 Next Visit: patient will schedule as needed.  UDS:10/11/2018  Narc Agreement: 10/11/2018   Last fill: 01/03/2019  Okay to refill tramadol?

## 2019-03-03 NOTE — Telephone Encounter (Signed)
Form have been requested.

## 2019-03-04 ENCOUNTER — Other Ambulatory Visit (HOSPITAL_COMMUNITY)
Admission: RE | Admit: 2019-03-04 | Discharge: 2019-03-04 | Disposition: A | Discharge status: Home / Self Care | Payer: Medicare Other | Source: Ambulatory Visit | Attending: Orthopedic Surgery | Admitting: Orthopedic Surgery

## 2019-03-04 DIAGNOSIS — Z01812 Encounter for preprocedural laboratory examination: Secondary | ICD-10-CM | POA: Insufficient documentation

## 2019-03-04 DIAGNOSIS — Z20822 Contact with and (suspected) exposure to covid-19: Secondary | ICD-10-CM | POA: Insufficient documentation

## 2019-03-04 NOTE — Progress Notes (Signed)
PCP - Scarlette Calico Cardiologist -   Chest x-ray -  EKG - 10-10-18 Stress Test -  ECHO - 12-27-18 Cardiac Cath -   Sleep Study -  CPAP -   Fasting Blood Sugar -  Checks Blood Sugar _____ times a day  Blood Thinner Instructions: Aspirin Instructions: Last Dose:  Anesthesia review: OSA on CPAP  Patient denies shortness of breath, fever, cough and chest pain at PAT appointment   Patient verbalized understanding of instructions that were given to them at the PAT appointment. Patient was also instructed that they will need to review over the PAT instructions again at home before surgery.

## 2019-03-04 NOTE — Patient Instructions (Signed)
DUE TO COVID-19 ONLY ONE VISITOR IS ALLOWED TO COME WITH YOU AND STAY IN THE WAITING ROOM ONLY DURING PRE OP AND PROCEDURE DAY OF SURGERY. THE 1 VISITOR MAY VISIT WITH YOU AFTER SURGERY IN YOUR PRIVATE ROOM DURING VISITING HOURS ONLY!  YOU NEED TO HAVE A COVID 19 TEST ON_______ @_______ , THIS TEST MUST BE DONE BEFORE SURGERY, COME  [place], [place] , [id number].  ([place]) ONCE YOUR COVID TEST IS COMPLETED, PLEASE BEGIN THE QUARANTINE INSTRUCTIONS AS OUTLINED IN YOUR HANDOUT.                LOISE DOYAL  03/04/2019   Your procedure is scheduled on: 03-07-19   Report to [facility] Main  Entrance   Report to admitting at          [place] AM     Call this number if you have problems the morning of surgery [phone]    Remember: NO SOLID FOOD AFTER MIDNIGHT THE NIGHT PRIOR TO SURGERY. NOTHING BY MOUTH EXCEPT CLEAR LIQUIDS UNTIL     0815 am . PLEASE FINISH ENSURE DRINK PER SURGEON ORDER  WHICH NEEDS TO BE COMPLETED AT      0815 then nothing by mouth .    CLEAR LIQUID DIET   Foods Allowed                                                                     Foods Excluded  Coffee and tea, regular and decaf                             liquids that you cannot  Plain Jell-O any favor except red or purple                                           see through such as: Fruit ices (not with fruit pulp)                                     milk, soups, orange juice  Iced Popsicles                                    All solid food Carbonated beverages, regular and diet                                    Cranberry, grape and apple juices Sports drinks like Gatorade Lightly seasoned clear broth or consume(fat free) Sugar, honey syrup   _____________________________________________________________________    BRUSH YOUR TEETH MORNING OF SURGERY AND RINSE YOUR MOUTH OUT, NO CHEWING GUM CANDY OR MINTS.     Take these medicines the morning of surgery with  A SIP OF WATER: levothyroxine, zyrtec, amlodipine  DO NOT TAKE ANY DIABETIC MEDICATIONS DAY OF YOUR SURGERY  You may not have any metal on your body including hair pins and              piercings  Do not wear jewelry,  lotions, powders or perfumes, deodorant                    Men may shave face and neck.   Do not bring valuables to the hospital. [place].  Contacts, dentures or bridgework may not be worn into surgery.      Patients discharged the day of surgery will not be allowed to drive home. IF YOU ARE HAVING SURGERY AND GOING HOME THE SAME DAY, YOU MUST HAVE AN ADULT TO DRIVE YOU HOME AND BE WITH YOU FOR 24 HOURS. YOU MAY GO HOME BY TAXI OR UBER OR ORTHERWISE, BUT AN ADULT MUST ACCOMPANY YOU HOME AND STAY WITH YOU FOR 24 HOURS.  Name and phone number of your driver:  Special Instructions: N/A              Please read over the following fact sheets you were given: _____________________________________________________________________             [facility] - Preparing for Surgery Before surgery, you can play an important role.  Because skin is not sterile, your skin needs to be as free of germs as possible.  You can reduce the number of germs on your skin by washing with CHG (chlorahexidine gluconate) soap before surgery.  CHG is an antiseptic cleaner which kills germs and bonds with the skin to continue killing germs even after washing. Please DO NOT use if you have an allergy to CHG or antibacterial soaps.  If your skin becomes reddened/irritated stop using the CHG and inform your nurse when you arrive at Short Stay. Do not shave (including legs and underarms) for at least 48 hours prior to the first CHG shower.  You may shave your face/neck. Please follow these instructions carefully:  1.  Shower with CHG Soap the night before surgery and the  morning of Surgery.  2.  If you choose to wash your hair,  wash your hair first as usual with your  normal  shampoo.  3.  After you shampoo, rinse your hair and body thoroughly to remove the  shampoo.                           4.  Use CHG as you would any other liquid soap.  You can apply chg directly  to the skin and wash                       Gently with a scrungie or clean washcloth.  5.  Apply the CHG Soap to your body ONLY FROM THE NECK DOWN.   Do not use on face/ open                           Wound or open sores. Avoid contact with eyes, ears mouth and genitals (private parts).                       Wash face,  Genitals (private parts) with your normal soap.             6.  Wash thoroughly, paying special attention to the area where your surgery  will be performed.  7.  Thoroughly rinse your body with warm water from the neck down.  8.  DO NOT shower/wash with your normal soap after using and rinsing off  the CHG Soap.                9.  Pat yourself dry with a clean towel.            10.  Wear clean pajamas.            11.  Place clean sheets on your bed the night of your first shower and do not  sleep with pets. Day of Surgery : Do not apply any lotions/deodorants the morning of surgery.  Please wear clean clothes to the hospital/surgery center.  FAILURE TO FOLLOW THESE INSTRUCTIONS MAY RESULT IN THE CANCELLATION OF YOUR SURGERY PATIENT SIGNATURE_________________________________  NURSE SIGNATURE__________________________________  ________________________________________________________________________   Jonathan Powers  An incentive spirometer is a tool that can help keep your lungs clear and active. This tool measures how well you are filling your lungs with each breath. Taking long deep breaths may help reverse or decrease the chance of developing breathing (pulmonary) problems (especially infection) following:  A long period of time when you are unable to move or be active. BEFORE THE PROCEDURE   If the spirometer includes an  indicator to show your best effort, your nurse or respiratory therapist will set it to a desired goal.  If possible, sit up straight or lean slightly forward. Try not to slouch.  Hold the incentive spirometer in an upright position. INSTRUCTIONS FOR USE  1. Sit on the edge of your bed if possible, or sit up as far as you can in bed or on a chair. 2. Hold the incentive spirometer in an upright position. 3. Breathe out normally. 4. Place the mouthpiece in your mouth and seal your lips tightly around it. 5. Breathe in slowly and as deeply as possible, raising the piston or the ball toward the top of the column. 6. Hold your breath for 3-5 seconds or for as long as possible. Allow the piston or ball to fall to the bottom of the column. 7. Remove the mouthpiece from your mouth and breathe out normally. 8. Rest for a few seconds and repeat Steps 1 through 7 at least 10 times every 1-2 hours when you are awake. Take your time and take a few normal breaths between deep breaths. 9. The spirometer may include an indicator to show your best effort. Use the indicator as a goal to work toward during each repetition. 10. After each set of 10 deep breaths, practice coughing to be sure your lungs are clear. If you have an incision (the cut made at the time of surgery), support your incision when coughing by placing a pillow or rolled up towels firmly against it. Once you are able to get out of bed, walk around indoors and cough well. You may stop using the incentive spirometer when instructed by your caregiver.  RISKS AND COMPLICATIONS  Take your time so you do not get dizzy or light-headed.  If you are in pain, you may need to take or ask for pain medication before doing incentive spirometry. It is harder to take a deep breath if you are having pain. AFTER USE  Rest and breathe slowly and easily.  It can be helpful to keep track of a log of your progress. Your  caregiver can provide you with a simple table  to help with this. If you are using the spirometer at home, follow these instructions: [place] IF:   You are having difficultly using the spirometer.  You have trouble using the spirometer as often as instructed.  Your pain medication is not giving enough relief while using the spirometer.  You develop fever of 100.5 F (38.1 C) or higher. SEEK IMMEDIATE MEDICAL CARE IF:   You cough up bloody sputum that had not been present before.  You develop fever of 102 F (38.9 C) or greater.  You develop worsening pain at or near the incision site. MAKE SURE YOU:   Understand these instructions.  Will watch your condition.  Will get help right away if you are not doing well or get worse. Document Released: 06/19/2006 Document Revised: 05/01/2011 Document Reviewed: 08/20/2006 ExitCare Patient Information 2014 ExitCare, [place].   ________________________________________________________________________  WHAT IS A BLOOD TRANSFUSION? Blood Transfusion Information  A transfusion is the replacement of blood or some of its parts. Blood is made up of multiple cells which provide different functions.  Red blood cells carry oxygen and are used for blood loss replacement.  White blood cells fight against infection.  Platelets control bleeding.  Plasma helps clot blood.  Other blood products are available for specialized needs, such as hemophilia or other clotting disorders. BEFORE THE TRANSFUSION  Who gives blood for transfusions?   Healthy volunteers who are fully evaluated to make sure their blood is safe. This is blood bank blood. Transfusion therapy is the safest it has ever been in the practice of medicine. Before blood is taken from a donor, a complete history is taken to make sure that person has no history of diseases nor engages in risky social behavior (examples are intravenous drug use or sexual activity with multiple partners). The donor's travel history is screened  to minimize risk of transmitting infections, such as malaria. The donated blood is tested for signs of infectious diseases, such as HIV and hepatitis. The blood is then tested to be sure it is compatible with you in order to minimize the chance of a transfusion reaction. If you or a relative donates blood, this is often done in anticipation of surgery and is not appropriate for emergency situations. It takes many days to process the donated blood. RISKS AND COMPLICATIONS Although transfusion therapy is very safe and saves many lives, the main dangers of transfusion include:   Getting an infectious disease.  Developing a transfusion reaction. This is an allergic reaction to something in the blood you were given. Every precaution is taken to prevent this. The decision to have a blood transfusion has been considered carefully by your caregiver before blood is given. Blood is not given unless the benefits outweigh the risks. AFTER THE TRANSFUSION  Right after receiving a blood transfusion, you will usually feel much better and more energetic. This is especially true if your red blood cells have gotten low (anemic). The transfusion raises the level of the red blood cells which carry oxygen, and this usually causes an energy increase.  The nurse administering the transfusion will monitor you carefully for complications. HOME CARE INSTRUCTIONS  No special instructions are needed after a transfusion. You may find your energy is better. Speak with your caregiver about any limitations on activity for underlying diseases you may have. SEEK MEDICAL CARE IF:   Your condition is not improving after your transfusion.  You develop redness or irritation at  the intravenous (IV) site. SEEK IMMEDIATE MEDICAL CARE IF:  Any of the following symptoms occur over the next 12 hours:  Shaking chills.  You have a temperature by mouth above 102 F (38.9 C), not controlled by medicine.  Chest, back, or muscle  pain.  People around you feel you are not acting correctly or are confused.  Shortness of breath or difficulty breathing.  Dizziness and fainting.  You get a rash or develop hives.  You have a decrease in urine output.  Your urine turns a dark color or changes to pink, red, or brown. Any of the following symptoms occur over the next 10 days:  You have a temperature by mouth above 102 F (38.9 C), not controlled by medicine.  Shortness of breath.  Weakness after normal activity.  The white part of the eye turns yellow (jaundice).  You have a decrease in the amount of urine or are urinating less often.  Your urine turns a dark color or changes to pink, red, or brown. Document Released: 02/04/2000 Document Revised: 05/01/2011 Document Reviewed: 09/23/2007 [facility] Patient Information 2014 [place], [place].  _______________________________________________________________________

## 2019-03-05 ENCOUNTER — Encounter (HOSPITAL_COMMUNITY): Payer: Self-pay

## 2019-03-05 ENCOUNTER — Encounter (HOSPITAL_COMMUNITY)
Admission: RE | Admit: 2019-03-05 | Discharge: 2019-03-05 | Disposition: A | Discharge status: Home / Self Care | Payer: Medicare Other | Source: Ambulatory Visit | Attending: Orthopedic Surgery | Admitting: Orthopedic Surgery

## 2019-03-05 ENCOUNTER — Other Ambulatory Visit: Payer: Self-pay

## 2019-03-05 ENCOUNTER — Ambulatory Visit (HOSPITAL_COMMUNITY)
Admission: RE | Admit: 2019-03-05 | Discharge: 2019-03-05 | Disposition: A | Discharge status: Home / Self Care | Payer: Medicare Other | Source: Ambulatory Visit | Attending: Orthopedic Surgery | Admitting: Orthopedic Surgery

## 2019-03-05 DIAGNOSIS — Z79899 Other long term (current) drug therapy: Secondary | ICD-10-CM | POA: Diagnosis not present

## 2019-03-05 DIAGNOSIS — Z79890 Hormone replacement therapy (postmenopausal): Secondary | ICD-10-CM | POA: Diagnosis not present

## 2019-03-05 DIAGNOSIS — Z01818 Encounter for other preprocedural examination: Secondary | ICD-10-CM

## 2019-03-05 DIAGNOSIS — G4733 Obstructive sleep apnea (adult) (pediatric): Secondary | ICD-10-CM | POA: Diagnosis not present

## 2019-03-05 DIAGNOSIS — I10 Essential (primary) hypertension: Secondary | ICD-10-CM | POA: Insufficient documentation

## 2019-03-05 DIAGNOSIS — E785 Hyperlipidemia, unspecified: Secondary | ICD-10-CM | POA: Diagnosis not present

## 2019-03-05 DIAGNOSIS — E039 Hypothyroidism, unspecified: Secondary | ICD-10-CM | POA: Diagnosis not present

## 2019-03-05 HISTORY — DX: Sleep apnea, unspecified: G47.30

## 2019-03-05 HISTORY — DX: Malignant (primary) neoplasm, unspecified: C80.1

## 2019-03-05 LAB — URINALYSIS, ROUTINE W REFLEX MICROSCOPIC
Bilirubin Urine: NEGATIVE
Glucose, UA: NEGATIVE mg/dL
Hgb urine dipstick: NEGATIVE
Ketones, ur: NEGATIVE mg/dL
Leukocytes,Ua: NEGATIVE
Nitrite: NEGATIVE
Protein, ur: NEGATIVE mg/dL
Specific Gravity, Urine: 1.024 (ref 1.005–1.030)
pH: 5 (ref 5.0–8.0)

## 2019-03-05 LAB — PROTIME-INR
INR: 1 (ref 0.8–1.2)
Prothrombin Time: 13 seconds (ref 11.4–15.2)

## 2019-03-05 LAB — BASIC METABOLIC PANEL
Anion gap: 8 (ref 5–15)
BUN: 14 mg/dL (ref 8–23)
CO2: 29 mmol/L (ref 22–32)
Calcium: 9.1 mg/dL (ref 8.9–10.3)
Chloride: 104 mmol/L (ref 98–111)
Creatinine, Ser: 0.92 mg/dL (ref 0.61–1.24)
GFR calc Af Amer: >60 mL/min (ref >60)
GFR calc non Af Amer: >60 mL/min (ref >60)
Glucose, Bld: 91 mg/dL (ref 70–99)
Potassium: 4.3 mmol/L (ref 3.5–5.1)
Sodium: 141 mmol/L (ref 135–145)

## 2019-03-05 LAB — NOVEL CORONAVIRUS, NAA (HOSP ORDER, SEND-OUT TO REF LAB; TAT 18-24 HRS): SARS-CoV-2, NAA: NOT DETECTED

## 2019-03-05 LAB — SURGICAL PCR SCREEN
MRSA, PCR: NEGATIVE
Staphylococcus aureus: NEGATIVE

## 2019-03-05 LAB — ABO/RH: ABO/RH(D): O POS

## 2019-03-05 LAB — APTT: aPTT: 32 seconds (ref 24–36)

## 2019-03-05 NOTE — Patient Instructions (Signed)
DUE TO COVID-19 ONLY ONE VISITOR IS ALLOWED TO COME WITH YOU AND STAY IN THE WAITING ROOM ONLY DURING PRE OP AND PROCEDURE DAY OF SURGERY. THE 1 VISITOR MAY VISIT WITH YOU AFTER SURGERY IN YOUR PRIVATE ROOM DURING VISITING HOURS ONLY!  YOU NEED TO HAVE A COVID 19 TEST ON_______ @_______ , THIS TEST MUST BE DONE BEFORE SURGERY, COME  [place] , [id number].  ([place]) ONCE YOUR COVID TEST IS COMPLETED, PLEASE BEGIN THE QUARANTINE INSTRUCTIONS AS OUTLINED IN YOUR HANDOUT.                [facility]ING  03/05/2019   Your procedure is scheduled on: 03-07-19   Report to [facility] Main  Entrance   Report to admitting at          [place] AM     Call this number if you have problems the morning of surgery [phone]    Remember: NO SOLID FOOD AFTER MIDNIGHT THE NIGHT PRIOR TO SURGERY. NOTHING BY MOUTH EXCEPT CLEAR LIQUIDS UNTIL     0815 am . PLEASE FINISH ENSURE DRINK PER SURGEON ORDER  WHICH NEEDS TO BE COMPLETED AT      0815 then nothing by mouth .    CLEAR LIQUID DIET   Foods Allowed                                                                     Foods Excluded  Coffee and tea, regular and decaf                             liquids that you cannot  Plain Jell-O any favor except red or purple                                           see through such as: Fruit ices (not with fruit pulp)                                     milk, soups, orange juice  Iced Popsicles                                    All solid food Carbonated beverages, regular and diet                                    Cranberry, grape and apple juices Sports drinks like Gatorade Lightly seasoned clear broth or consume(fat free) Sugar, honey syrup   _____________________________________________________________________    BRUSH YOUR TEETH MORNING OF SURGERY AND RINSE YOUR MOUTH OUT, NO CHEWING GUM CANDY OR MINTS.     Take these medicines the morning of surgery with  A SIP OF WATER: levothyroxine, zyrtec, amlodipine  DO NOT TAKE ANY DIABETIC MEDICATIONS DAY OF YOUR SURGERY  You may not have any metal on your body including hair pins and              piercings  Do not wear jewelry,  lotions, powders or perfumes, deodorant                    Men may shave face and neck.   Do not bring valuables to the hospital. [place].  Contacts, dentures or bridgework may not be worn into surgery.      Patients discharged the day of surgery will not be allowed to drive home. IF YOU ARE HAVING SURGERY AND GOING HOME THE SAME DAY, YOU MUST HAVE AN ADULT TO DRIVE YOU HOME AND BE WITH YOU FOR 24 HOURS. YOU MAY GO HOME BY TAXI OR UBER OR ORTHERWISE, BUT AN ADULT MUST ACCOMPANY YOU HOME AND STAY WITH YOU FOR 24 HOURS.  Name and phone number of your driver:  Special Instructions: N/A              Please read over the following fact sheets you were given: _____________________________________________________________________             [facility] - Preparing for Surgery Before surgery, you can play an important role.  Because skin is not sterile, your skin needs to be as free of germs as possible.  You can reduce the number of germs on your skin by washing with CHG (chlorahexidine gluconate) soap before surgery.  CHG is an antiseptic cleaner which kills germs and bonds with the skin to continue killing germs even after washing. Please DO NOT use if you have an allergy to CHG or antibacterial soaps.  If your skin becomes reddened/irritated stop using the CHG and inform your nurse when you arrive at Short Stay. Do not shave (including legs and underarms) for at least 48 hours prior to the first CHG shower.  You may shave your face/neck. Please follow these instructions carefully:  1.  Shower with CHG Soap the night before surgery and the  morning of Surgery.  2.  If you choose to wash your hair,  wash your hair first as usual with your  normal  shampoo.  3.  After you shampoo, rinse your hair and body thoroughly to remove the  shampoo.                           4.  Use CHG as you would any other liquid soap.  You can apply chg directly  to the skin and wash                       Gently with a scrungie or clean washcloth.  5.  Apply the CHG Soap to your body ONLY FROM THE NECK DOWN.   Do not use on face/ open                           Wound or open sores. Avoid contact with eyes, ears mouth and genitals (private parts).                       Wash face,  Genitals (private parts) with your normal soap.             6.  Wash thoroughly, paying special attention to the area where your surgery  will be performed.  7.  Thoroughly rinse your body with warm water from the neck down.  8.  DO NOT shower/wash with your normal soap after using and rinsing off  the CHG Soap.                9.  Pat yourself dry with a clean towel.            10.  Wear clean pajamas.            11.  Place clean sheets on your bed the night of your first shower and do not  sleep with pets. Day of Surgery : Do not apply any lotions/deodorants the morning of surgery.  Please wear clean clothes to the hospital/surgery center.  FAILURE TO FOLLOW THESE INSTRUCTIONS MAY RESULT IN THE CANCELLATION OF YOUR SURGERY PATIENT SIGNATURE_________________________________  NURSE SIGNATURE__________________________________  ________________________________________________________________________   Adam Phenix  An incentive spirometer is a tool that can help keep your lungs clear and active. This tool measures how well you are filling your lungs with each breath. Taking long deep breaths may help reverse or decrease the chance of developing breathing (pulmonary) problems (especially infection) following:  A long period of time when you are unable to move or be active. BEFORE THE PROCEDURE   If the spirometer includes an  indicator to show your best effort, your nurse or respiratory therapist will set it to a desired goal.  If possible, sit up straight or lean slightly forward. Try not to slouch.  Hold the incentive spirometer in an upright position. INSTRUCTIONS FOR USE  1. Sit on the edge of your bed if possible, or sit up as far as you can in bed or on a chair. 2. Hold the incentive spirometer in an upright position. 3. Breathe out normally. 4. Place the mouthpiece in your mouth and seal your lips tightly around it. 5. Breathe in slowly and as deeply as possible, raising the piston or the ball toward the top of the column. 6. Hold your breath for 3-5 seconds or for as long as possible. Allow the piston or ball to fall to the bottom of the column. 7. Remove the mouthpiece from your mouth and breathe out normally. 8. Rest for a few seconds and repeat Steps 1 through 7 at least 10 times every 1-2 hours when you are awake. Take your time and take a few normal breaths between deep breaths. 9. The spirometer may include an indicator to show your best effort. Use the indicator as a goal to work toward during each repetition. 10. After each set of 10 deep breaths, practice coughing to be sure your lungs are clear. If you have an incision (the cut made at the time of surgery), support your incision when coughing by placing a pillow or rolled up towels firmly against it. Once you are able to get out of bed, walk around indoors and cough well. You may stop using the incentive spirometer when instructed by your caregiver.  RISKS AND COMPLICATIONS  Take your time so you do not get dizzy or light-headed.  If you are in pain, you may need to take or ask for pain medication before doing incentive spirometry. It is harder to take a deep breath if you are having pain. AFTER USE  Rest and breathe slowly and easily.  It can be helpful to keep track of a log of your progress. Your  caregiver can provide you with a simple table  to help with this. If you are using the spirometer at home, follow these instructions: [place] IF:   You are having difficultly using the spirometer.  You have trouble using the spirometer as often as instructed.  Your pain medication is not giving enough relief while using the spirometer.  You develop fever of 100.5 F (38.1 C) or higher. SEEK IMMEDIATE MEDICAL CARE IF:   You cough up bloody sputum that had not been present before.  You develop fever of 102 F (38.9 C) or greater.  You develop worsening pain at or near the incision site. MAKE SURE YOU:   Understand these instructions.  Will watch your condition.  Will get help right away if you are not doing well or get worse. Document Released: 06/19/2006 Document Revised: 05/01/2011 Document Reviewed: 08/20/2006 ExitCare Patient Information 2014 ExitCare, [place].   ________________________________________________________________________  WHAT IS A BLOOD TRANSFUSION? Blood Transfusion Information  A transfusion is the replacement of blood or some of its parts. Blood is made up of multiple cells which provide different functions.  Red blood cells carry oxygen and are used for blood loss replacement.  White blood cells fight against infection.  Platelets control bleeding.  Plasma helps clot blood.  Other blood products are available for specialized needs, such as hemophilia or other clotting disorders. BEFORE THE TRANSFUSION  Who gives blood for transfusions?   Healthy volunteers who are fully evaluated to make sure their blood is safe. This is blood bank blood. Transfusion therapy is the safest it has ever been in the practice of medicine. Before blood is taken from a donor, a complete history is taken to make sure that person has no history of diseases nor engages in risky social behavior (examples are intravenous drug use or sexual activity with multiple partners). The donor's travel history is screened  to minimize risk of transmitting infections, such as malaria. The donated blood is tested for signs of infectious diseases, such as HIV and hepatitis. The blood is then tested to be sure it is compatible with you in order to minimize the chance of a transfusion reaction. If you or a relative donates blood, this is often done in anticipation of surgery and is not appropriate for emergency situations. It takes many days to process the donated blood. RISKS AND COMPLICATIONS Although transfusion therapy is very safe and saves many lives, the main dangers of transfusion include:   Getting an infectious disease.  Developing a transfusion reaction. This is an allergic reaction to something in the blood you were given. Every precaution is taken to prevent this. The decision to have a blood transfusion has been considered carefully by your caregiver before blood is given. Blood is not given unless the benefits outweigh the risks. AFTER THE TRANSFUSION  Right after receiving a blood transfusion, you will usually feel much better and more energetic. This is especially true if your red blood cells have gotten low (anemic). The transfusion raises the level of the red blood cells which carry oxygen, and this usually causes an energy increase.  The nurse administering the transfusion will monitor you carefully for complications. HOME CARE INSTRUCTIONS  No special instructions are needed after a transfusion. You may find your energy is better. Speak with your caregiver about any limitations on activity for underlying diseases you may have. SEEK MEDICAL CARE IF:   Your condition is not improving after your transfusion.  You develop redness or irritation at  the intravenous (IV) site. SEEK IMMEDIATE MEDICAL CARE IF:  Any of the following symptoms occur over the next 12 hours:  Shaking chills.  You have a temperature by mouth above 102 F (38.9 C), not controlled by medicine.  Chest, back, or muscle  pain.  People around you feel you are not acting correctly or are confused.  Shortness of breath or difficulty breathing.  Dizziness and fainting.  You get a rash or develop hives.  You have a decrease in urine output.  Your urine turns a dark color or changes to pink, red, or brown. Any of the following symptoms occur over the next 10 days:  You have a temperature by mouth above 102 F (38.9 C), not controlled by medicine.  Shortness of breath.  Weakness after normal activity.  The white part of the eye turns yellow (jaundice).  You have a decrease in the amount of urine or are urinating less often.  Your urine turns a dark color or changes to pink, red, or brown. Document Released: 02/04/2000 Document Revised: 05/01/2011 Document Reviewed: 09/23/2007 [facility] Patient Information 2014 [place], [place].  _______________________________________________________________________

## 2019-03-05 NOTE — Progress Notes (Signed)
PCP - Scarlette Calico Cardiologist -   Chest x-ray -  EKG - 10-10-18 epic Stress Test -  ECHO - 12-27-18 epic Cardiac Cath -  Cbc/diff 02-28-19   ON CHART Sleep Study -  CPAP -   Fasting Blood Sugar -  Checks Blood Sugar _____ times a day  Blood Thinner Instructions: Aspirin Instructions: Last Dose:  Anesthesia review: OSA sleep Apnea  Patient denies shortness of breath, fever, cough and chest pain at PAT appointment   Patient verbalized understanding of instructions that were given to them at the PAT appointment. Patient was also instructed that they will need to review over the PAT instructions again at home before surgery.

## 2019-03-06 DIAGNOSIS — Z96649 Presence of unspecified artificial hip joint: Secondary | ICD-10-CM

## 2019-03-06 DIAGNOSIS — T84018A Broken internal joint prosthesis, other site, initial encounter: Secondary | ICD-10-CM

## 2019-03-06 HISTORY — DX: Broken internal joint prosthesis, other site, initial encounter: T84.018A

## 2019-03-06 HISTORY — DX: Broken internal joint prosthesis, other site, initial encounter: Z96.649

## 2019-03-06 MED ORDER — BUPIVACAINE LIPOSOME 1.3 % IJ SUSP
10.0000 mL | Freq: Once | INTRAMUSCULAR | Status: DC
  Filled 2019-03-06: qty 10

## 2019-03-06 MED ORDER — VANCOMYCIN HCL 1500 MG/300ML IV SOLN
1500.0000 mg | INTRAVENOUS | Status: AC
  Administered 2019-03-07: 1500 mg via INTRAVENOUS
  Filled 2019-03-06: qty 300

## 2019-03-06 MED ORDER — TRANEXAMIC ACID 1000 MG/10ML IV SOLN
2000.0000 mg | INTRAVENOUS | Status: DC
  Filled 2019-03-06: qty 20

## 2019-03-06 NOTE — H&P (Signed)
TOTAL HIP REVISION ADMISSION H&P  Patient is admitted for left revision total hip arthroplasty.  Subjective:  Chief Complaint: left hip pain  HPI: Jonathan Powers, 69 y.o. male, has a history of pain and functional disability in the left hip due to arthritis and patient has failed non-surgical conservative treatments for greater than 12 weeks to include NSAID's and/or analgesics, use of assistive devices and activity modification. The indications for the revision total hip arthroplasty are bearing surface wear leading to  symptomatic synovitis and implant or hip misalignment.  Onset of symptoms was gradual starting several years ago with gradually worsening course since that time.  Prior procedures on the left hip include arthroplasty.  Patient currently rates pain in the left hip at 10 out of 10 with activity.  There is night pain, worsening of pain with activity and weight bearing, trendelenberg gait, pain that interfers with activities of daily living and pain with passive range of motion. Patient has evidence of joint space narrowing by imaging studies.  This condition presents safety issues increasing the risk of falls.  There is no current active infection.  Patient Active Problem List   Diagnosis Date Noted  . Intestinal infection due to Clostridioides difficile 11/28/2018  . Diarrhea of presumed infectious origin 11/26/2018  . Recurrent genital HSV (herpes simplex virus) infection 11/12/2018  . HLD (hyperlipidemia) 11/11/2018  . Other specified hypothyroidism 11/11/2018  . Cellulitis of left lower extremity 11/10/2018  . Insomnia 11/10/2018  . GERD (gastroesophageal reflux disease) 11/10/2018  . Seasonal allergies 11/10/2018  . Recurrent oral herpes simplex 09/23/2018  . Age-related osteoporosis with current pathological fracture 03/11/2018  . Flexural eczema 07/24/2017  . Routine general medical examination at a health care facility 10/12/2016  . Chronic right-sided low back pain  without sciatica 07/21/2016  . Low testosterone 07/21/2016  . Idiopathic chronic gout of multiple sites without tophus 07/21/2016  . Superior mesenteric artery aneurysm ([place]) 07/12/2015  . Left thyroid nodule   . OSA (obstructive sleep apnea) 01/27/2013  . Hypothyroid   . Symptomatic PVCs 09/06/2012  . Dyslipidemia, goal LDL below 130 09/06/2012  . BPH (benign prostatic hyperplasia) 09/06/2012  . OBESITY NOS 05/17/2006  . Essential hypertension 05/17/2006  . ALLERGIC RHINITIS 05/17/2006  . OSTEOARTHRITIS 05/17/2006   Past Medical History:  Diagnosis Date  . ALLERGIC RHINITIS   . Cancer ([place])    Melonoma back  . CELLULITIS, LEG, RIGHT    Recurrent R hip cellulitis 1/08,3/08   . Diverticulitis   . GERD (gastroesophageal reflux disease)   . Gout   . Hashimoto's thyroiditis   . Hypertension   . Hypogonadism male    low T, a/w ED  . Hypothyroid    goiters   . Left thyroid nodule 2008   on [place], decrease size in 2011 [place]  . Migraines    Atypical and ocular  . OSTEOARTHRITIS   . Seasonal allergies   . Sleep apnea    cpap    Past Surgical History:  Procedure Laterality Date  . CARPAL TUNNEL RELEASE Left 03/30/2017   Procedure: CARPAL TUNNEL RELEASE;  Surgeon: Garald Balding, MD;  Location: [facility];  Service: Orthopedics;  Laterality: Left;  . CHOLECYSTECTOMY  2004  . CTR Bilateral 1982  . CYSTOSCOPY  1974  . FOOT FUSION Left 06/22/2017  . HEMORRHOID SURGERY N/A 03/30/2017   Procedure: HEMORRHOIDECTOMY;  Surgeon: Coralie Keens, MD;  Location: [facility];  Service: General;  Laterality: N/A;  . open  reduction L little finger Left 1970  . Repair digital thumb, left Left 1990  . Right shoulder cuff repair Right 09/15/11  . Right shoulder SAD, DCR Right 02/05/11  . TOTAL HIP ARTHROPLASTY Left 07/26/06  . TOTAL HIP ARTHROPLASTY Right 1999    Current Facility-Administered Medications  Medication Dose Route Frequency Provider Last Rate Last  Admin  . [START ON 03/07/2019] bupivacaine liposome (EXPAREL) 1.3 % injection 133 mg  10 mL Other Once Lenis Noon, [facility]      . [START ON 03/07/2019] tranexamic acid (CYKLOKAPRON) 2,000 mg in sodium chloride 0.9 % 50 mL Topical Application  123XX123 mg Topical To OR Lenis Noon, RPH      . [START ON 03/07/2019] vancomycin (VANCOREADY) IVPB 1500 mg/300 mL  1,500 mg Intravenous On Call to OR Lenis Noon, [facility]       Current Outpatient Medications  Medication Sig Dispense Refill Last Dose  . acetaminophen (TYLENOL) 500 MG tablet Take 1,000 mg by mouth every 6 (six) hours as needed for moderate pain.      [name] amLODipine (NORVASC) 2.5 MG tablet Take 1 tablet (2.5 mg total) by mouth daily. 90 tablet 0   . cetirizine (ZYRTEC) 10 MG tablet Take 10 mg by mouth daily.     . diclofenac sodium (VOLTAREN) 1 % GEL 2-4 grams qid as needed for pain as discussed in office (Patient taking differently: Apply 2 g topically 4 (four) times daily as needed (pain). ) 10 Tube 12   . levothyroxine (SYNTHROID) 175 MCG tablet Take 1 tablet (175 mcg total) by mouth daily before breakfast. 90 tablet 1   . ondansetron (ZOFRAN-ODT) 8 MG disintegrating tablet Take 1 tablet (8 mg total) by mouth every 8 (eight) hours as needed for nausea or vomiting. 20 tablet 0   . rosuvastatin (CRESTOR) 5 MG tablet Take 1 tablet (5 mg total) by mouth at bedtime. 90 tablet 1   . tadalafil (CIALIS) 5 MG tablet Take 5 mg by mouth at bedtime.     . Testosterone 1.62 % GEL Apply 3 Pump topically daily.     . traZODone (DESYREL) 100 MG tablet TAKE ONE HALF TABLET TO ONE TABLET BY MOUTH AT BEDTIME AS NEEDED FOR SLEEP (Patient taking differently: Take 50 mg by mouth at bedtime. ) 90 tablet 0   . valACYclovir (VALTREX) 1000 MG tablet TAKE ONE TABLET BY MOUTH TWICE DAILY  (Patient taking differently: Take 500 mg by mouth daily as needed. ) 14 tablet 2   . doxycycline (VIBRAMYCIN) 100 MG capsule Take 1 capsule (100 mg total) by mouth 2 (two) times daily.  (Patient not taking: Reported on 02/28/2019) 20 capsule 0 Not Taking at Unknown time  . hydrocortisone-pramoxine (PROCTOFOAM HC) rectal foam Place 1 applicator rectally 2 (two) times daily. (Patient not taking: Reported on 02/28/2019) 10 g 0 Not Taking at Unknown time  . methocarbamol (ROBAXIN) 500 MG tablet TAKE ONE TABLET BY MOUTH DAILY AT BEDTIME  (Patient not taking: No sig reported) 30 tablet 0 Not Taking at Unknown time  . telmisartan (MICARDIS) 40 MG tablet TAKE ONE TABLET BY MOUTH ONE TIME DAILY  90 tablet 0   . traMADol (ULTRAM) 50 MG tablet Take 1 tablet (50 mg total) by mouth 3 (three) times daily as needed. 90 tablet 0    Allergies  Allergen Reactions  . Nsaids Shortness Of Breath    Naproxen specifically causes SOB/wheeze tolerates topical diclofenac without problem Tylenol OK  . Tolmetin Shortness Of Breath  Naproxen specifically causes SOB/wheeze tolerates topical diclofenac without problem Tylenol OK  . Penicillins Rash    Did it involve swelling of the face/tongue/throat, SOB, or low BP? No Did it involve sudden or severe rash/hives, skin peeling, or any reaction on the inside of your mouth or nose? No Did you need to seek medical attention at a hospital or doctor's office? No When did it last happen? If all above answers are "NO", may proceed with cephalosporin use.     Social History   Tobacco Use  . Smoking status: Never Smoker  . Smokeless tobacco: Never Used  Substance Use Topics  . Alcohol use: Yes    Alcohol/week: 0.0 standard drinks    Comment: rare    Family History  Problem Relation Age of Onset  . Arthritis Father   . Heart disease Father   . Diabetes Father   . Vascular Disease Father        AoBifem  . Arthritis Mother   . Diabetes Mother   . Multiple sclerosis Mother   . Cancer Paternal Grandmother        "GI"  . Cancer Paternal Grandfather        stomach cancer  . Thyroid cancer Sister   . Uterine cancer Sister       Review of  Systems  Constitutional: Negative.   HENT: Positive for hearing loss and sinus pain.   Eyes: Negative.   Respiratory: Negative.   Cardiovascular: Positive for palpitations and leg swelling.  Endocrine: Negative.   Genitourinary: Negative.   Musculoskeletal: Positive for arthralgias and myalgias.  Allergic/Immunologic: Negative.   Neurological: Positive for weakness.  Psychiatric/Behavioral: Negative.     Objective:  Physical Exam  Constitutional: He is oriented to person, place, and time. He appears well-developed and well-nourished.  HENT:  Head: Normocephalic and atraumatic.  Eyes: Pupils are equal, round, and reactive to light.  Cardiovascular: Intact distal pulses.  Respiratory: Effort normal.  Musculoskeletal:        General: Tenderness present.     Cervical back: Normal range of motion and neck supple.     Comments: Surgical scar to the left hip is well-healed no erythema no swelling internal and external rotation are 30 each without any difficulty he does walk with a left-sided limp.    Neurological: He is alert and oriented to person, place, and time.  Skin: Skin is warm and dry.  Psychiatric: He has a normal mood and affect. His behavior is normal. Judgment and thought content normal.    Vital signs in last 24 hours:     Labs:   Estimated body mass index is 39.61 kg/m as calculated from the following:   Height as of 03/05/19: 5' 10.5" (1.791 m).   Weight as of 03/05/19: 127 kg.  Imaging Review:  Serial x-rays the abduction of the cup is over 60 and comparing x-rays from 2011 and 2013 to the ones we did today the 36 mm head has migrated superiorly about 5 mm.  It also looks like it is starting to laterally sublux.    Assessment/Plan:  End stage arthritis, left hip(s) with failed previous arthroplasty.  The patient history, physical examination, clinical judgement of the provider and imaging studies are consistent with end stage degenerative joint disease  of the left hip(s), previous total hip arthroplasty. Revision total hip arthroplasty is deemed medically necessary. The treatment options including medical management, injection therapy, arthroscopy and arthroplasty were discussed at length. The risks and benefits of total  hip arthroplasty were presented and reviewed. The risks due to aseptic loosening, infection, stiffness, dislocation/subluxation,  thromboembolic complications and other imponderables were discussed.  The patient acknowledged the explanation, agreed to proceed with the plan and consent was signed. Patient is being admitted for inpatient treatment for surgery, pain control, PT, OT, prophylactic antibiotics, VTE prophylaxis, progressive ambulation and ADL's and discharge planning. The patient is planning to be discharged home with home health services

## 2019-03-07 ENCOUNTER — Inpatient Hospital Stay (HOSPITAL_COMMUNITY): Payer: Medicare Other | Admitting: Physician Assistant

## 2019-03-07 ENCOUNTER — Other Ambulatory Visit: Payer: Self-pay

## 2019-03-07 ENCOUNTER — Observation Stay (HOSPITAL_COMMUNITY)
Admission: RE | Admit: 2019-03-07 | Discharge: 2019-03-08 | Disposition: A | Discharge status: Home / Self Care | Payer: Medicare Other | Attending: Orthopedic Surgery | Admitting: Orthopedic Surgery

## 2019-03-07 ENCOUNTER — Encounter (HOSPITAL_COMMUNITY): Payer: Self-pay | Admitting: Orthopedic Surgery

## 2019-03-07 ENCOUNTER — Inpatient Hospital Stay (HOSPITAL_COMMUNITY): Payer: Medicare Other

## 2019-03-07 ENCOUNTER — Encounter (HOSPITAL_COMMUNITY)
Admission: RE | Disposition: A | Discharge status: Home / Self Care | Payer: Self-pay | Source: Home / Self Care | Attending: Orthopedic Surgery

## 2019-03-07 DIAGNOSIS — E785 Hyperlipidemia, unspecified: Secondary | ICD-10-CM | POA: Diagnosis not present

## 2019-03-07 DIAGNOSIS — Z886 Allergy status to analgesic agent status: Secondary | ICD-10-CM | POA: Diagnosis not present

## 2019-03-07 DIAGNOSIS — Z96649 Presence of unspecified artificial hip joint: Secondary | ICD-10-CM

## 2019-03-07 DIAGNOSIS — Z880 Allergy status to penicillin: Secondary | ICD-10-CM | POA: Diagnosis not present

## 2019-03-07 DIAGNOSIS — R262 Difficulty in walking, not elsewhere classified: Secondary | ICD-10-CM | POA: Insufficient documentation

## 2019-03-07 DIAGNOSIS — Z79899 Other long term (current) drug therapy: Secondary | ICD-10-CM | POA: Insufficient documentation

## 2019-03-07 DIAGNOSIS — M1990 Unspecified osteoarthritis, unspecified site: Secondary | ICD-10-CM | POA: Diagnosis not present

## 2019-03-07 DIAGNOSIS — Y792 Prosthetic and other implants, materials and accessory orthopedic devices associated with adverse incidents: Secondary | ICD-10-CM | POA: Diagnosis not present

## 2019-03-07 DIAGNOSIS — Z96641 Presence of right artificial hip joint: Secondary | ICD-10-CM | POA: Diagnosis not present

## 2019-03-07 DIAGNOSIS — M1612 Unilateral primary osteoarthritis, left hip: Secondary | ICD-10-CM | POA: Insufficient documentation

## 2019-03-07 DIAGNOSIS — E039 Hypothyroidism, unspecified: Secondary | ICD-10-CM | POA: Insufficient documentation

## 2019-03-07 DIAGNOSIS — E063 Autoimmune thyroiditis: Secondary | ICD-10-CM | POA: Insufficient documentation

## 2019-03-07 DIAGNOSIS — T84061A Wear of articular bearing surface of internal prosthetic left hip joint, initial encounter: Principal | ICD-10-CM | POA: Insufficient documentation

## 2019-03-07 DIAGNOSIS — G4733 Obstructive sleep apnea (adult) (pediatric): Secondary | ICD-10-CM | POA: Insufficient documentation

## 2019-03-07 DIAGNOSIS — I10 Essential (primary) hypertension: Secondary | ICD-10-CM | POA: Insufficient documentation

## 2019-03-07 DIAGNOSIS — T8484XA Pain due to internal orthopedic prosthetic devices, implants and grafts, initial encounter: Secondary | ICD-10-CM | POA: Diagnosis not present

## 2019-03-07 DIAGNOSIS — T84018A Broken internal joint prosthesis, other site, initial encounter: Secondary | ICD-10-CM

## 2019-03-07 DIAGNOSIS — Z79890 Hormone replacement therapy (postmenopausal): Secondary | ICD-10-CM | POA: Insufficient documentation

## 2019-03-07 DIAGNOSIS — Z471 Aftercare following joint replacement surgery: Secondary | ICD-10-CM | POA: Diagnosis not present

## 2019-03-07 DIAGNOSIS — Z96642 Presence of left artificial hip joint: Secondary | ICD-10-CM | POA: Diagnosis not present

## 2019-03-07 DIAGNOSIS — E669 Obesity, unspecified: Secondary | ICD-10-CM | POA: Diagnosis not present

## 2019-03-07 HISTORY — PX: TOTAL HIP REVISION: SHX763

## 2019-03-07 LAB — TYPE AND SCREEN
ABO/RH(D): O POS
Antibody Screen: NEGATIVE

## 2019-03-07 SURGERY — TOTAL HIP REVISION
Anesthesia: Spinal | Site: Hip | Laterality: Left

## 2019-03-07 MED ORDER — EPHEDRINE SULFATE-NACL 50-0.9 MG/10ML-% IV SOSY
PREFILLED_SYRINGE | INTRAVENOUS | Status: DC | PRN
  Administered 2019-03-07 (×2): 10 mg via INTRAVENOUS
  Administered 2019-03-07 (×2): 5 mg via INTRAVENOUS

## 2019-03-07 MED ORDER — POLYETHYLENE GLYCOL 3350 17 G PO PACK: 17.0000 g | PACK | Freq: Every day | ORAL | Status: DC | PRN

## 2019-03-07 MED ORDER — ONDANSETRON HCL 4 MG/2ML IJ SOLN: 4.0000 mg | Freq: Four times a day (QID) | INTRAMUSCULAR | Status: DC | PRN

## 2019-03-07 MED ORDER — TRANEXAMIC ACID-NACL 1000-0.7 MG/100ML-% IV SOLN
1000.0000 mg | Freq: Once | INTRAVENOUS | Status: AC
  Administered 2019-03-07: 1000 mg via INTRAVENOUS
  Filled 2019-03-07: qty 100

## 2019-03-07 MED ORDER — GABAPENTIN 100 MG PO CAPS
100.0000 mg | ORAL_CAPSULE | Freq: Three times a day (TID) | ORAL | Status: DC
  Administered 2019-03-07 – 2019-03-08 (×3): 100 mg via ORAL
  Filled 2019-03-07 (×3): qty 1

## 2019-03-07 MED ORDER — SODIUM CHLORIDE 0.9% FLUSH
INTRAVENOUS | Status: DC | PRN
  Administered 2019-03-07: 20 mL

## 2019-03-07 MED ORDER — METHOCARBAMOL 500 MG IVPB - SIMPLE MED
INTRAVENOUS | Status: AC
  Filled 2019-03-07: qty 50

## 2019-03-07 MED ORDER — HYDROMORPHONE HCL 1 MG/ML IJ SOLN
0.5000 mg | INTRAMUSCULAR | Status: DC | PRN
  Administered 2019-03-07: 1 mg via INTRAVENOUS
  Filled 2019-03-07: qty 1

## 2019-03-07 MED ORDER — LORATADINE 10 MG PO TABS
10.0000 mg | ORAL_TABLET | Freq: Every day | ORAL | Status: DC
  Administered 2019-03-08: 10 mg via ORAL
  Filled 2019-03-07: qty 1

## 2019-03-07 MED ORDER — TRANEXAMIC ACID 1000 MG/10ML IV SOLN
INTRAVENOUS | Status: DC | PRN
  Administered 2019-03-07: 2000 mg via TOPICAL

## 2019-03-07 MED ORDER — METOCLOPRAMIDE HCL 5 MG/ML IJ SOLN: 5.0000 mg | Freq: Three times a day (TID) | INTRAMUSCULAR | Status: DC | PRN

## 2019-03-07 MED ORDER — 0.9 % SODIUM CHLORIDE (POUR BTL) OPTIME
TOPICAL | Status: DC | PRN
  Administered 2019-03-07 (×2): 1000 mL

## 2019-03-07 MED ORDER — DEXAMETHASONE SODIUM PHOSPHATE 10 MG/ML IJ SOLN
INTRAMUSCULAR | Status: AC
  Filled 2019-03-07: qty 1

## 2019-03-07 MED ORDER — METHOCARBAMOL 500 MG PO TABS
500.0000 mg | ORAL_TABLET | Freq: Four times a day (QID) | ORAL | Status: DC | PRN
  Administered 2019-03-07: 500 mg via ORAL
  Filled 2019-03-07: qty 1

## 2019-03-07 MED ORDER — AMLODIPINE BESYLATE 5 MG PO TABS
2.5000 mg | ORAL_TABLET | Freq: Every day | ORAL | Status: DC
  Filled 2019-03-07: qty 1

## 2019-03-07 MED ORDER — METHOCARBAMOL 500 MG IVPB - SIMPLE MED
500.0000 mg | Freq: Four times a day (QID) | INTRAVENOUS | Status: DC | PRN
  Administered 2019-03-07: 500 mg via INTRAVENOUS
  Filled 2019-03-07: qty 50

## 2019-03-07 MED ORDER — ONDANSETRON HCL 4 MG/2ML IJ SOLN: 4.0000 mg | Freq: Once | INTRAMUSCULAR | Status: DC | PRN

## 2019-03-07 MED ORDER — TRANEXAMIC ACID-NACL 1000-0.7 MG/100ML-% IV SOLN
1000.0000 mg | INTRAVENOUS | Status: AC
  Administered 2019-03-07: 1000 mg via INTRAVENOUS
  Filled 2019-03-07: qty 100

## 2019-03-07 MED ORDER — FENTANYL CITRATE (PF) 100 MCG/2ML IJ SOLN
INTRAMUSCULAR | Status: AC
  Filled 2019-03-07: qty 2

## 2019-03-07 MED ORDER — SUGAMMADEX SODIUM 500 MG/5ML IV SOLN
INTRAVENOUS | Status: DC | PRN
  Administered 2019-03-07: 300 mg via INTRAVENOUS

## 2019-03-07 MED ORDER — ONDANSETRON 4 MG PO TBDP: 8.0000 mg | ORAL_TABLET | Freq: Three times a day (TID) | ORAL | Status: DC | PRN

## 2019-03-07 MED ORDER — PROPOFOL 10 MG/ML IV BOLUS
INTRAVENOUS | Status: DC | PRN
  Administered 2019-03-07: 150 ug via INTRAVENOUS

## 2019-03-07 MED ORDER — ROCURONIUM BROMIDE 10 MG/ML (PF) SYRINGE
PREFILLED_SYRINGE | INTRAVENOUS | Status: AC
  Filled 2019-03-07: qty 10

## 2019-03-07 MED ORDER — LACTATED RINGERS IV SOLN: INTRAVENOUS | Status: DC

## 2019-03-07 MED ORDER — PROPOFOL 500 MG/50ML IV EMUL
INTRAVENOUS | Status: DC | PRN
  Administered 2019-03-07: 25 ug/kg/min via INTRAVENOUS

## 2019-03-07 MED ORDER — SUGAMMADEX SODIUM 500 MG/5ML IV SOLN
INTRAVENOUS | Status: AC
  Filled 2019-03-07: qty 5

## 2019-03-07 MED ORDER — HYDROMORPHONE HCL 2 MG PO TABS
2.0000 mg | ORAL_TABLET | ORAL | Status: DC | PRN
  Administered 2019-03-07: 4 mg via ORAL
  Administered 2019-03-08 (×4): 2 mg via ORAL
  Filled 2019-03-07: qty 2
  Filled 2019-03-07 (×4): qty 1

## 2019-03-07 MED ORDER — DIPHENHYDRAMINE HCL 12.5 MG/5ML PO ELIX: 12.5000 mg | ORAL_SOLUTION | ORAL | Status: DC | PRN

## 2019-03-07 MED ORDER — MIDAZOLAM HCL 2 MG/2ML IJ SOLN
INTRAMUSCULAR | Status: AC
  Filled 2019-03-07: qty 2

## 2019-03-07 MED ORDER — OXYCODONE HCL 5 MG PO TABS
5.0000 mg | ORAL_TABLET | ORAL | Status: DC | PRN
  Administered 2019-03-07: 10 mg via ORAL
  Filled 2019-03-07: qty 2

## 2019-03-07 MED ORDER — BUPIVACAINE-EPINEPHRINE (PF) 0.5% -1:200000 IJ SOLN
INTRAMUSCULAR | Status: DC | PRN
  Administered 2019-03-07: 30 mL

## 2019-03-07 MED ORDER — TRAZODONE HCL 50 MG PO TABS
50.0000 mg | ORAL_TABLET | Freq: Every day | ORAL | Status: DC
  Administered 2019-03-07: 50 mg via ORAL
  Filled 2019-03-07: qty 1

## 2019-03-07 MED ORDER — METOCLOPRAMIDE HCL 5 MG PO TABS: 5.0000 mg | ORAL_TABLET | Freq: Three times a day (TID) | ORAL | Status: DC | PRN

## 2019-03-07 MED ORDER — BUPIVACAINE HCL (PF) 0.5 % IJ SOLN
INTRAMUSCULAR | Status: AC
  Filled 2019-03-07: qty 30

## 2019-03-07 MED ORDER — ROCURONIUM BROMIDE 50 MG/5ML IV SOSY
PREFILLED_SYRINGE | INTRAVENOUS | Status: DC | PRN
  Administered 2019-03-07 (×2): 20 mg via INTRAVENOUS
  Administered 2019-03-07: 10 mg via INTRAVENOUS
  Administered 2019-03-07: 20 mg via INTRAVENOUS
  Administered 2019-03-07: 60 mg via INTRAVENOUS

## 2019-03-07 MED ORDER — HYDROMORPHONE HCL 1 MG/ML IJ SOLN
0.2500 mg | INTRAMUSCULAR | Status: DC | PRN
  Administered 2019-03-07 (×2): 0.5 mg via INTRAVENOUS

## 2019-03-07 MED ORDER — DEXAMETHASONE SODIUM PHOSPHATE 10 MG/ML IJ SOLN
INTRAMUSCULAR | Status: DC | PRN
  Administered 2019-03-07: 10 mg via INTRAVENOUS

## 2019-03-07 MED ORDER — STERILE WATER FOR IRRIGATION IR SOLN
Status: DC | PRN
  Administered 2019-03-07: 2000 mL

## 2019-03-07 MED ORDER — ALUMINUM HYDROXIDE GEL 320 MG/5ML PO SUSP
15.0000 mL | ORAL | Status: DC | PRN
  Filled 2019-03-07: qty 30

## 2019-03-07 MED ORDER — IRBESARTAN 150 MG PO TABS
150.0000 mg | ORAL_TABLET | Freq: Every day | ORAL | Status: DC
  Administered 2019-03-07: 150 mg via ORAL
  Filled 2019-03-07 (×2): qty 1

## 2019-03-07 MED ORDER — DEXAMETHASONE SODIUM PHOSPHATE 10 MG/ML IJ SOLN
10.0000 mg | Freq: Once | INTRAMUSCULAR | Status: AC
  Administered 2019-03-08: 10 mg via INTRAVENOUS
  Filled 2019-03-07: qty 1

## 2019-03-07 MED ORDER — DOCUSATE SODIUM 100 MG PO CAPS
100.0000 mg | ORAL_CAPSULE | Freq: Two times a day (BID) | ORAL | Status: DC
  Administered 2019-03-07 – 2019-03-08 (×2): 100 mg via ORAL
  Filled 2019-03-07 (×2): qty 1

## 2019-03-07 MED ORDER — FENTANYL CITRATE (PF) 100 MCG/2ML IJ SOLN
25.0000 ug | INTRAMUSCULAR | Status: DC | PRN
  Administered 2019-03-07 (×3): 50 ug via INTRAVENOUS

## 2019-03-07 MED ORDER — BUPIVACAINE-EPINEPHRINE 0.5% -1:200000 IJ SOLN
INTRAMUSCULAR | Status: AC
  Filled 2019-03-07: qty 1

## 2019-03-07 MED ORDER — PROPOFOL 500 MG/50ML IV EMUL
INTRAVENOUS | Status: AC
  Filled 2019-03-07: qty 50

## 2019-03-07 MED ORDER — ACETAMINOPHEN 325 MG PO TABS: 325.0000 mg | ORAL_TABLET | Freq: Four times a day (QID) | ORAL | Status: DC | PRN

## 2019-03-07 MED ORDER — FENTANYL CITRATE (PF) 100 MCG/2ML IJ SOLN
INTRAMUSCULAR | Status: DC | PRN
  Administered 2019-03-07 (×6): 50 ug via INTRAVENOUS

## 2019-03-07 MED ORDER — LEVOTHYROXINE SODIUM 50 MCG PO TABS
175.0000 ug | ORAL_TABLET | Freq: Every day | ORAL | Status: DC
  Administered 2019-03-08: 175 ug via ORAL
  Filled 2019-03-07: qty 1

## 2019-03-07 MED ORDER — ONDANSETRON HCL 4 MG/2ML IJ SOLN
INTRAMUSCULAR | Status: DC | PRN
  Administered 2019-03-07: 4 mg via INTRAVENOUS

## 2019-03-07 MED ORDER — FLEET ENEMA 7-19 GM/118ML RE ENEM: 1.0000 | ENEMA | Freq: Once | RECTAL | Status: DC | PRN

## 2019-03-07 MED ORDER — LIDOCAINE 2% (20 MG/ML) 5 ML SYRINGE
INTRAMUSCULAR | Status: DC | PRN
  Administered 2019-03-07: 60 mg via INTRAVENOUS

## 2019-03-07 MED ORDER — ONDANSETRON HCL 4 MG/2ML IJ SOLN
INTRAMUSCULAR | Status: AC
  Filled 2019-03-07: qty 2

## 2019-03-07 MED ORDER — TIZANIDINE HCL 2 MG PO TABS: 2.0000 mg | ORAL_TABLET | Freq: Four times a day (QID) | ORAL | 0 refills | Status: DC | PRN

## 2019-03-07 MED ORDER — BUPIVACAINE LIPOSOME 1.3 % IJ SUSP
INTRAMUSCULAR | Status: DC | PRN
  Administered 2019-03-07: 10 mL

## 2019-03-07 MED ORDER — MIDAZOLAM HCL 5 MG/5ML IJ SOLN
INTRAMUSCULAR | Status: DC | PRN
  Administered 2019-03-07 (×2): 1 mg via INTRAVENOUS

## 2019-03-07 MED ORDER — HYDROMORPHONE HCL 1 MG/ML IJ SOLN
INTRAMUSCULAR | Status: AC
  Filled 2019-03-07: qty 1

## 2019-03-07 MED ORDER — ROSUVASTATIN CALCIUM 5 MG PO TABS
5.0000 mg | ORAL_TABLET | Freq: Every day | ORAL | Status: DC
  Administered 2019-03-07: 5 mg via ORAL
  Filled 2019-03-07: qty 1

## 2019-03-07 MED ORDER — PHENOL 1.4 % MT LIQD
1.0000 | OROMUCOSAL | Status: DC | PRN
  Filled 2019-03-07: qty 177

## 2019-03-07 MED ORDER — TESTOSTERONE 50 MG/5GM (1%) TD GEL: 5.0000 g | Freq: Every day | TRANSDERMAL | Status: DC

## 2019-03-07 MED ORDER — PANTOPRAZOLE SODIUM 40 MG PO TBEC
40.0000 mg | DELAYED_RELEASE_TABLET | Freq: Every day | ORAL | Status: DC
  Administered 2019-03-07 – 2019-03-08 (×2): 40 mg via ORAL
  Filled 2019-03-07 (×2): qty 1

## 2019-03-07 MED ORDER — APIXABAN 2.5 MG PO TABS: 2.5000 mg | ORAL_TABLET | Freq: Two times a day (BID) | ORAL | 0 refills | Status: DC

## 2019-03-07 MED ORDER — SODIUM CHLORIDE (PF) 0.9 % IJ SOLN
INTRAMUSCULAR | Status: AC
  Filled 2019-03-07: qty 20

## 2019-03-07 MED ORDER — VALACYCLOVIR HCL 500 MG PO TABS
500.0000 mg | ORAL_TABLET | Freq: Every day | ORAL | Status: DC | PRN
  Filled 2019-03-07: qty 1

## 2019-03-07 MED ORDER — EPHEDRINE 5 MG/ML INJ
INTRAVENOUS | Status: AC
  Filled 2019-03-07: qty 10

## 2019-03-07 MED ORDER — PROPOFOL 10 MG/ML IV BOLUS
INTRAVENOUS | Status: AC
  Filled 2019-03-07: qty 20

## 2019-03-07 MED ORDER — OXYCODONE-ACETAMINOPHEN 5-325 MG PO TABS: 1.0000 | ORAL_TABLET | ORAL | 0 refills | Status: DC | PRN

## 2019-03-07 MED ORDER — APIXABAN 2.5 MG PO TABS
2.5000 mg | ORAL_TABLET | Freq: Two times a day (BID) | ORAL | Status: DC
  Administered 2019-03-08: 2.5 mg via ORAL
  Filled 2019-03-07: qty 1

## 2019-03-07 MED ORDER — ACETAMINOPHEN 500 MG PO TABS
1000.0000 mg | ORAL_TABLET | Freq: Once | ORAL | Status: AC
  Administered 2019-03-07: 1000 mg via ORAL
  Filled 2019-03-07: qty 2

## 2019-03-07 MED ORDER — ACETAMINOPHEN 500 MG PO TABS
1000.0000 mg | ORAL_TABLET | Freq: Four times a day (QID) | ORAL | Status: AC
  Administered 2019-03-07 – 2019-03-08 (×4): 1000 mg via ORAL
  Filled 2019-03-07 (×4): qty 2

## 2019-03-07 MED ORDER — PHENYLEPHRINE HCL-NACL 10-0.9 MG/250ML-% IV SOLN
INTRAVENOUS | Status: DC | PRN
  Administered 2019-03-07: 25 ug/min via INTRAVENOUS

## 2019-03-07 MED ORDER — PROPOFOL 1000 MG/100ML IV EMUL
INTRAVENOUS | Status: AC
  Filled 2019-03-07: qty 100

## 2019-03-07 MED ORDER — GLYCOPYRROLATE PF 0.2 MG/ML IJ SOSY
PREFILLED_SYRINGE | INTRAMUSCULAR | Status: DC | PRN
  Administered 2019-03-07: .2 mg via INTRAVENOUS

## 2019-03-07 MED ORDER — KCL IN DEXTROSE-NACL 20-5-0.45 MEQ/L-%-% IV SOLN
INTRAVENOUS | Status: DC
  Filled 2019-03-07 (×2): qty 1000

## 2019-03-07 MED ORDER — BISACODYL 5 MG PO TBEC: 5.0000 mg | DELAYED_RELEASE_TABLET | Freq: Every day | ORAL | Status: DC | PRN

## 2019-03-07 MED ORDER — POVIDONE-IODINE 10 % EX SWAB
2.0000 "application " | Freq: Once | CUTANEOUS | Status: AC
  Administered 2019-03-07: 2 via TOPICAL

## 2019-03-07 MED ORDER — MENTHOL 3 MG MT LOZG
1.0000 | LOZENGE | OROMUCOSAL | Status: DC | PRN
  Filled 2019-03-07: qty 9

## 2019-03-07 MED ORDER — CHLORHEXIDINE GLUCONATE 4 % EX LIQD: 60.0000 mL | Freq: Once | CUTANEOUS | Status: DC

## 2019-03-07 MED ORDER — ONDANSETRON HCL 4 MG PO TABS: 4.0000 mg | ORAL_TABLET | Freq: Four times a day (QID) | ORAL | Status: DC | PRN

## 2019-03-07 SURGICAL SUPPLY — 66 items
BAG DECANTER FOR FLEXI CONT (MISCELLANEOUS) ×2 IMPLANT
BLADE SAW SGTL 73X25 THK (BLADE) ×1 IMPLANT
CHLORAPREP W/TINT 26 (MISCELLANEOUS) ×2 IMPLANT
COVER SURGICAL LIGHT HANDLE (MISCELLANEOUS) ×2 IMPLANT
COVER WAND RF STERILE (DRAPES) IMPLANT
CUP GRIPTION SECTOR 62MM (Orthopedic Implant) ×1 IMPLANT
DECANTER SPIKE VIAL GLASS SM (MISCELLANEOUS) ×4 IMPLANT
DRAPE C-ARM 42X120 X-RAY (DRAPES) IMPLANT
DRAPE C-ARMOR (DRAPES) IMPLANT
DRAPE ORTHO SPLIT 77X108 STRL (DRAPES) ×2
DRAPE SHEET LG 3/4 BI-LAMINATE (DRAPES) ×2 IMPLANT
DRAPE SURG ORHT 6 SPLT 77X108 (DRAPES) ×2 IMPLANT
DRAPE U-SHAPE 47X51 STRL (DRAPES) ×2 IMPLANT
DRESSING AQUACEL AG SP 3.5X10 (GAUZE/BANDAGES/DRESSINGS) IMPLANT
DRSG AQUACEL AG ADV 3.5X10 (GAUZE/BANDAGES/DRESSINGS) ×2 IMPLANT
DRSG AQUACEL AG SP 3.5X10 (GAUZE/BANDAGES/DRESSINGS) ×2
ELECT BLADE TIP CTD 4 INCH (ELECTRODE) ×2 IMPLANT
ELECT REM PT RETURN 15FT ADLT (MISCELLANEOUS) ×2 IMPLANT
GAUZE SPONGE 4X4 12PLY STRL (GAUZE/BANDAGES/DRESSINGS) IMPLANT
GAUZE XEROFORM 5X9 LF (GAUZE/BANDAGES/DRESSINGS) ×1 IMPLANT
GLOVE BIO SURGEON STRL SZ7.5 (GLOVE) ×2 IMPLANT
GLOVE BIO SURGEON STRL SZ8.5 (GLOVE) ×2 IMPLANT
GLOVE BIOGEL PI IND STRL 8 (GLOVE) ×1 IMPLANT
GLOVE BIOGEL PI IND STRL 9 (GLOVE) ×1 IMPLANT
GLOVE BIOGEL PI INDICATOR 8 (GLOVE) ×1
GLOVE BIOGEL PI INDICATOR 9 (GLOVE) ×1
GOWN STRL REIN XL XLG (GOWN DISPOSABLE) ×4 IMPLANT
HEAD FEMORAL DELTA 28MM 12/14 (Head) ×1 IMPLANT
HOLDER FOLEY CATH W/STRAP (MISCELLANEOUS) ×2 IMPLANT
HOOD PEEL AWAY FLYTE STAYCOOL (MISCELLANEOUS) ×7 IMPLANT
IMMOBILIZER KNEE 20 (SOFTGOODS) ×2
IMMOBILIZER KNEE 20 THIGH 36 (SOFTGOODS) IMPLANT
IV NS IRRIG 3000ML ARTHROMATIC (IV SOLUTION) ×2 IMPLANT
KIT BASIN OR (CUSTOM PROCEDURE TRAY) ×2 IMPLANT
KIT TURNOVER KIT A (KITS) IMPLANT
LINER BI-MENTUM PE 28/53 HIP (Miscellaneous) ×1 IMPLANT
LINER DUAL MOBILITY 62/53 (Miscellaneous) ×1 IMPLANT
NDL HYPO 21X1.5 SAFETY (NEEDLE) ×1 IMPLANT
NDL MAYO CATGUT SZ4 TPR NDL (NEEDLE) IMPLANT
NDL SAFETY ECLIPSE 18X1.5 (NEEDLE) ×1 IMPLANT
NEEDLE HYPO 18GX1.5 SHARP (NEEDLE) ×1
NEEDLE HYPO 21X1.5 SAFETY (NEEDLE) ×2 IMPLANT
NEEDLE MAYO CATGUT SZ4 (NEEDLE) IMPLANT
NS IRRIG 1000ML POUR BTL (IV SOLUTION) ×2 IMPLANT
PACK TOTAL JOINT (CUSTOM PROCEDURE TRAY) ×2 IMPLANT
PASSER SUT SWANSON 36MM LOOP (INSTRUMENTS) IMPLANT
PENCIL SMOKE EVACUATOR (MISCELLANEOUS) ×1 IMPLANT
PROTECTOR NERVE ULNAR (MISCELLANEOUS) ×2 IMPLANT
SCREW 6.5MMX30MM (Screw) ×1 IMPLANT
SPONGE LAP 18X18 RF (DISPOSABLE) ×2 IMPLANT
STAPLER VISISTAT 35W (STAPLE) ×2 IMPLANT
SUT ETHIBOND 2 V 37 (SUTURE) ×6 IMPLANT
SUT VIC AB 0 CT1 36 (SUTURE) ×2 IMPLANT
SUT VIC AB 1 CTX 36 (SUTURE) ×1
SUT VIC AB 1 CTX36XBRD ANBCTR (SUTURE) ×1 IMPLANT
SUT VIC AB 3-0 CT1 27 (SUTURE) ×1
SUT VIC AB 3-0 CT1 TAPERPNT 27 (SUTURE) ×1 IMPLANT
SWAB COLLECTION DEVICE MRSA (MISCELLANEOUS) IMPLANT
SWAB CULTURE ESWAB REG 1ML (MISCELLANEOUS) IMPLANT
SYR CONTROL 10ML LL (SYRINGE) ×4 IMPLANT
TOWEL OR 17X26 10 PK STRL BLUE (TOWEL DISPOSABLE) ×2 IMPLANT
TOWEL OR NON WOVEN STRL DISP B (DISPOSABLE) ×2 IMPLANT
TOWER CARTRIDGE SMART MIX (DISPOSABLE) IMPLANT
TRAY FOLEY MTR SLVR 14FR STAT (SET/KITS/TRAYS/PACK) ×2 IMPLANT
WATER STERILE IRR 1000ML POUR (IV SOLUTION) ×4 IMPLANT
YANKAUER SUCT BULB TIP 10FT TU (MISCELLANEOUS) ×2 IMPLANT

## 2019-03-07 NOTE — Progress Notes (Signed)
CSW spoke with the [facility] ED RN CM who walked the CSW through the process of submitting info to auto-populate the CODE 44.  CSW will continue to follow for D/C needs.  Jonathan Powers. Arbadella Kimbler, LCSW, LCAS, CSI Transitions of Care Clinical Social Worker Care Coordination Department Ph: [phone]

## 2019-03-07 NOTE — Transfer of Care (Signed)
Immediate Anesthesia Transfer of Care Note  Patient: Jonathan Powers  Procedure(s) Performed: LEFT TOTAL HIP REVISION POSTERIOR APPROACH (Left Hip)  Patient Location: PACU  Anesthesia Type:General  Level of Consciousness: awake, alert  and oriented  Airway & Oxygen Therapy: Patient Spontanous Breathing and Patient connected to face mask oxygen  Post-op Assessment: Report given to RN and Post -op Vital signs reviewed and stable  Post vital signs: Reviewed and stable  Last Vitals:  Vitals Value Taken Time  BP    Temp    Pulse    Resp    SpO2      Last Pain:  Vitals:   03/07/19 0934  TempSrc:   PainSc: 4          Complications: No apparent anesthesia complications

## 2019-03-07 NOTE — Care Management CC44 (Signed)
Condition Code 44 Documentation Completed  Patient Details  Name: Jonathan Powers MRN: [id number] Date of Birth: 1950-10-13   Condition Code 44 given:  Yes Patient signature on Condition Code 44 notice:  Yes Documentation of 2 MD's agreement:  Yes Code 44 added to claim:  Yes    Claudine Mouton, LCSW 03/07/2019, 6:21 PM

## 2019-03-07 NOTE — Care Management (Signed)
ED CM reviewed record with ED CSW to process regulatory forms.

## 2019-03-07 NOTE — Anesthesia Procedure Notes (Signed)
Procedure Name: Intubation Date/Time: 03/07/2019 11:59 AM Performed by: Maxwell Caul, CRNA Pre-anesthesia Checklist: Patient identified, Emergency Drugs available, Suction available and Patient being monitored Patient Re-evaluated:Patient Re-evaluated prior to induction Oxygen Delivery Method: Circle system utilized Preoxygenation: Pre-oxygenation with 100% oxygen Induction Type: IV induction Ventilation: Mask ventilation without difficulty Laryngoscope Size: Mac and 4 Grade View: Grade II Tube type: Oral Tube size: 7.5 mm Number of attempts: 1 Airway Equipment and Method: Stylet and Oral airway Placement Confirmation: ETT inserted through vocal cords under direct vision,  positive ETCO2 and breath sounds checked- equal and bilateral Secured at: 21 cm Tube secured with: Tape Dental Injury: Teeth and Oropharynx as per pre-operative assessment

## 2019-03-07 NOTE — Progress Notes (Signed)
CSW received a call from [name] in [place]. who states that pt's status has changed from inpatient to outpatient status and thus the pt will need to be served a "CODE 44".  CSW will continue to follow for D/C needs.  Jonathan Powers. Merranda Bolls, LCSW, LCAS, CSI Transitions of Care Clinical Social Worker Care Coordination Department Ph: [phone]

## 2019-03-07 NOTE — Discharge Instructions (Addendum)
Information on my medicine - ELIQUIS (apixaban)  This medication education was reviewed with me or my healthcare representative as part of my discharge preparation.  Why was Eliquis prescribed for you? Eliquis was prescribed for you to reduce the risk of blood clots forming after orthopedic surgery.    What do You need to know about Eliquis? Take your Eliquis TWICE DAILY - one tablet in the morning and one tablet in the evening with or without food.  It would be best to take the dose about the same time each day.  If you have difficulty swallowing the tablet whole please discuss with your pharmacist how to take the medication safely.  Take Eliquis exactly as prescribed by your doctor and DO NOT stop taking Eliquis without talking to the doctor who prescribed the medication.  Stopping without other medication to take the place of Eliquis may increase your risk of developing a clot.  After discharge, you should have regular check-up appointments with your healthcare provider that is prescribing your Eliquis.  What do you do if you miss a dose? If a dose of ELIQUIS is not taken at the scheduled time, take it as soon as possible on the same day and twice-daily administration should be resumed.  The dose should not be doubled to make up for a missed dose.  Do not take more than one tablet of ELIQUIS at the same time.  Important Safety Information A possible side effect of Eliquis is bleeding. You should call your healthcare provider right away if you experience any of the following: ? Bleeding from an injury or your nose that does not stop. ? Unusual colored urine (red or dark brown) or unusual colored stools (red or black). ? Unusual bruising for unknown reasons. ? A serious fall or if you hit your head (even if there is no bleeding).  Some medicines may interact with Eliquis and might increase your risk of bleeding or clotting while on Eliquis. To help avoid this, consult your  healthcare provider or pharmacist prior to using any new prescription or non-prescription medications, including herbals, vitamins, non-steroidal anti-inflammatory drugs (NSAIDs) and supplements.  This website has more information on Eliquis (apixaban): http://www.eliquis.com/eliquis/home  INSTRUCTIONS AFTER JOINT REPLACEMENT   o Remove items at home which could result in a fall. This includes throw rugs or furniture in walking pathways o ICE to the affected joint every three hours while awake for 30 minutes at a time, for at least the first 3-5 days, and then as needed for pain and swelling.  Continue to use ice for pain and swelling. You may notice swelling that will progress down to the foot and ankle.  This is normal after surgery.  Elevate your leg when you are not up walking on it.   o Continue to use the breathing machine you got in the hospital (incentive spirometer) which will help keep your temperature down.  It is common for your temperature to cycle up and down following surgery, especially at night when you are not up moving around and exerting yourself.  The breathing machine keeps your lungs expanded and your temperature down.   DIET:  As you were doing prior to hospitalization, we recommend a well-balanced diet.  DRESSING / WOUND CARE / SHOWERING  Keep the surgical dressing until follow up.  The dressing is water proof, so you can shower without any extra covering.  IF THE DRESSING FALLS OFF or the wound gets wet inside, change the dressing with sterile gauze.  Please use good hand washing techniques before changing the dressing.  Do not use any lotions or creams on the incision until instructed by your surgeon.    ACTIVITY  o Increase activity slowly as tolerated, but follow the weight bearing instructions below.   o No driving for 6 weeks or until further direction given by your physician.  You cannot drive while taking narcotics.  o No lifting or carrying greater than 10 lbs.  until further directed by your surgeon. o Avoid periods of inactivity such as sitting longer than an hour when not asleep. This helps prevent blood clots.  o You may return to work once you are authorized by your doctor.     WEIGHT BEARING   Weight bearing as tolerated with assist device (walker, cane, etc) as directed, use it as long as suggested by your surgeon or therapist, typically at least 4-6 weeks.   EXERCISES  Results after joint replacement surgery are often greatly improved when you follow the exercise, range of motion and muscle strengthening exercises prescribed by your doctor. Safety measures are also important to protect the joint from further injury. Any time any of these exercises cause you to have increased pain or swelling, decrease what you are doing until you are comfortable again and then slowly increase them. If you have problems or questions, call your caregiver or physical therapist for advice.   Rehabilitation is important following a joint replacement. After just a few days of immobilization, the muscles of the leg can become weakened and shrink (atrophy).  These exercises are designed to build up the tone and strength of the thigh and leg muscles and to improve motion. Often times heat used for twenty to thirty minutes before working out will loosen up your tissues and help with improving the range of motion but do not use heat for the first two weeks following surgery (sometimes heat can increase post-operative swelling).   These exercises can be done on a training (exercise) mat, on the floor, on a table or on a bed. Use whatever works the best and is most comfortable for you.    Use music or television while you are exercising so that the exercises are a pleasant break in your day. This will make your life better with the exercises acting as a break in your routine that you can look forward to.   Perform all exercises about fifteen times, three times per day or as  directed.  You should exercise both the operative leg and the other leg as well.  Exercises include:   . Quad Sets - Tighten up the muscle on the front of the thigh (Quad) and hold for 5-10 seconds.   . Straight Leg Raises - With your knee straight (if you were given a brace, keep it on), lift the leg to 60 degrees, hold for 3 seconds, and slowly lower the leg.  Perform this exercise against resistance later as your leg gets stronger.  . Leg Slides: Lying on your back, slowly slide your foot toward your buttocks, bending your knee up off the floor (only go as far as is comfortable). Then slowly slide your foot back down until your leg is flat on the floor again.  G[place]ard Haring Wings: Lying on your back spread your legs to the side as far apart as you can without causing discomfort.  . Hamstring Strength:  Lying on your back, push your heel against the floor with your leg straight by tightening up the  muscles of your buttocks.  Repeat, but this time bend your knee to a comfortable angle, and push your heel against the floor.  You may put a pillow under the heel to make it more comfortable if necessary.   A rehabilitation program following joint replacement surgery can speed recovery and prevent re-injury in the future due to weakened muscles. Contact your doctor or a physical therapist for more information on knee rehabilitation.    CONSTIPATION  Constipation is defined medically as fewer than three stools per week and severe constipation as less than one stool per week.  Even if you have a regular bowel pattern at home, your normal regimen is likely to be disrupted due to multiple reasons following surgery.  Combination of anesthesia, postoperative narcotics, change in appetite and fluid intake all can affect your bowels.   YOU MUST use at least one of the following options; they are listed in order of increasing strength to get the job done.  They are all available over the counter, and you may need to  use some, POSSIBLY even all of these options:    Drink plenty of fluids (prune juice may be helpful) and high fiber foods Colace 100 mg by mouth twice a day  Senokot for constipation as directed and as needed Dulcolax (bisacodyl), take with full glass of water  Miralax (polyethylene glycol) once or twice a day as needed.  If you have tried all these things and are unable to have a bowel movement in the first 3-4 days after surgery call either your surgeon or your primary doctor.    If you experience loose stools or diarrhea, hold the medications until you stool forms back up.  If your symptoms do not get better within 1 week or if they get worse, check with your doctor.  If you experience "the worst abdominal pain ever" or develop nausea or vomiting, please contact the office immediately for further recommendations for treatment.   ITCHING:  If you experience itching with your medications, try taking only a single pain pill, or even half a pain pill at a time.  You can also use Benadryl over the counter for itching or also to help with sleep.   TED HOSE STOCKINGS:  Use stockings on both legs until for at least 2 weeks or as directed by physician office. They may be removed at night for sleeping.  MEDICATIONS:  See your medication summary on the "After Visit Summary" that nursing will review with you.  You may have some home medications which will be placed on hold until you complete the course of blood thinner medication.  It is important for you to complete the blood thinner medication as prescribed.  PRECAUTIONS:  If you experience chest pain or shortness of breath - call 911 immediately for transfer to the hospital emergency department.   If you develop a fever greater that 101 F, purulent drainage from wound, increased redness or drainage from wound, foul odor from the wound/dressing, or calf pain - CONTACT YOUR SURGEON.                                                   FOLLOW-UP  APPOINTMENTS:  If you do not already have a post-op appointment, please call the office for an appointment to be seen by your surgeon.  Guidelines for how  soon to be seen are listed in your "After Visit Summary", but are typically between 1-4 weeks after surgery.  OTHER INSTRUCTIONS:   Knee Replacement:  Do not place pillow under knee, focus on keeping the knee straight while resting. CPM instructions: 0-90 degrees, 2 hours in the morning, 2 hours in the afternoon, and 2 hours in the evening. Place foam block, curve side up under heel at all times except when in CPM or when walking.  DO NOT modify, tear, cut, or change the foam block in any way.  MAKE SURE YOU:  . Understand these instructions.  . Get help right away if you are not doing well or get worse.    Thank you for letting us be a part of your medical care team.  It is a privilege we respect greatly.  We hope these instructions will help you stay on track for a fast and full recovery!

## 2019-03-07 NOTE — Op Note (Signed)
Preop diagnosis: Painful left total hip arthroplasty with a relatively vertical cup and severe polyethylene wear  Postoperative diagnosis: Same  Procedure: Revision left total hip arthroplasty with removal of Pinnacle shell that had been placed in 2009 abduction angle with 70 degrees anteversion was acceptable.  Patient has had significant polywear with pain with walking and prior to the polyethylene actually fracture you like to have it revised risk and benefits of surgery been discussed all questions answered. Surgeon: Kathalene Frames. Mayer Camel M.D.  Assistant: Kerry Hough. Barton Dubois  (present throughout entire procedure and necessary for timely completion of the procedure)  Estimated blood loss: 400 cc  Fluid replacement: 1600 cc of crystalloid  Complications: None  Indications: Patient had primary left total hip arthroplasty in 2009 abduction angle on the shell was 70 degrees and over time he is developed significant superior polyethylene wear now with pain even when he is riding his bicycle.  Preop work-up included normal CBC sed rate and CRP CT scan showed no significant osteolysis but did show at least 5 mm of superior polyethylene wear.  He desires elective/urgent revision left total hip arthroplasty with removal and replacement of the Pinnacle shell we will place a dual mobility liner to enhance stability.  Procedure: Patient was identified by arm band receive preoperative IV antibiotics in the holding area at, and hospital. He was then taken to the operating room where the appropriate anesthetic monitors were attached and general endotracheal anesthesia induced with the patient in the supine position. He was then rolled into the R lateral decubitus position and fixed there with a mark 2 pelvic clamp. The limb prepped and draped in usual sterile fashion from the ankle to the hemipelvis. Time out procedure performed. Skin along the lateral hip and thigh infiltrated with 10 cc of 1/2% Marcaine and  epinephrine solution. We began the operation by recreating the old posterior lateral incision 15 cm in line through the skin and subcutaneous tissue down to the level of the IT band which was cut in line with the skin incision.  The pseudocapsule was taken down off the intertrochanteric crest exposing the trunnion of the AML stem.  Scar tissue was removed superior and inferiorly allowing [place] to dislocate the hip and remove the 36 mm metal ball without difficulty.  This revealed the polyethylene liner which should have severe superior wear.  We further remove scar tissue from around the acetabular component allowing [place] to then talk the trunnion superior and anterior to the acetabulum levering with a Hohmann retractor.  We then removed the polyethylene liner with the Depuy polyethylene liner removal tool and remove the apex hole eliminator.  A trial 36 0 degree liner was then placed allowing [place] to then insert the short and long Innomed osteotomes radius for a 56 mm shell.  We then broke the interface between the shell and the bone allowing [place] to remove the cup and about 1 maybe 2 mm of bone in some areas.  The cup was noted to be very heavily ingrown.  Also note that the joint fluid was clear when we entered the joint.  We then reamed up to a 61 mm reamer obtaining good bony coverage in all quadrants and then hammered into place a new 62 mm Gryption sector cup placing a single dome screw.  The cup was placed in 45 degrees of abduction and 20 degrees of anteversion.  We then irrigated with normal saline solution and a dual mobility metal liner was placed inside the  shell followed by a 53 mm polyethylene ball with a +5x28 mm ceramic ball pressed inside.  The hip was then reduced taken through range of motion could not be dislocated and excellent sternal rotation and extension and was stable in flexion with internal rotation of 60 to 70 degrees.  The wound was once again irrigated out with normal saline solution the  pseudocapsule flap repaired back to the intertrochanteric crest through drill holes with #2 Ethibond suture.  The wound was then infiltrated with Exparel solution throughout.  Using a 21-gauge by 1/2 inch needle on a 10 cc syringe. We then closed the IT band with running #1 Vicryl suture, the subcutaneous tissue with 0 and 2-0 undyed Vicryl suture, the skin was closed with running interlocking 3-0 nylon suture. A dressing of Mepilex was then applied, the patient was unclamped a rolled supine awakened extubated and taken to the recovery without difficulty.

## 2019-03-07 NOTE — Anesthesia Preprocedure Evaluation (Addendum)
Anesthesia Evaluation  Patient identified by MRN, date of birth, ID band Patient awake    Reviewed: Allergy & Precautions, NPO status , Patient's Chart, lab work & pertinent test results  Airway Mallampati: II  TM Distance: >3 FB Neck ROM: Full    Dental no notable dental hx.    Pulmonary sleep apnea and Continuous Positive Airway Pressure Ventilation ,    Pulmonary exam normal breath sounds clear to auscultation       Cardiovascular hypertension, Pt. on medications Normal cardiovascular exam Rhythm:Regular Rate:Normal  ECG: NSR, rate 66  ECHO: Echocardiogram 12/28/2018: Left ventricle cavity is normal in size. Mild concentric hypertrophy of the left ventricle. Normal LV systolic function with EF 59%. Normal global wall motion. Doppler evidence of grade I (impaired) diastolic dysfunction, normal LAP.  Left atrial cavity is mildly dilated. Mild tricuspid regurgitation. Estimated pulmonary artery systolic pressure is 26 mmHg. IVC is dilated with respiratory variation. Estimated RA pressure 8 mmHg.  Sees cardiologist Einar Gip)   Neuro/Psych  Headaches, negative psych ROS   GI/Hepatic Neg liver ROS, GERD  Controlled,  Endo/Other  Hypothyroidism   Renal/GU negative Renal ROS     Musculoskeletal negative musculoskeletal ROS (+) Gout   Abdominal   Peds  Hematology HLD Platelets: 271 on 02/28/19   Anesthesia Other Findings WORN LEFT HIP REPLACMENT  Reproductive/Obstetrics                           Anesthesia Physical Anesthesia Plan  ASA: III  Anesthesia Plan: Spinal   Post-op Pain Management:    Induction: Intravenous  PONV Risk Score and Plan: 1 and Ondansetron, Dexamethasone, Propofol infusion and Treatment may vary due to age or medical condition  Airway Management Planned: Simple Face Mask  Additional Equipment:   Intra-op Plan:   Post-operative Plan:   Informed Consent: I have  reviewed the patients History and Physical, chart, labs and discussed the procedure including the risks, benefits and alternatives for the proposed anesthesia with the patient or authorized representative who has indicated his/her understanding and acceptance.     Dental advisory given  Plan Discussed with: CRNA  Anesthesia Plan Comments:         Anesthesia Quick Evaluation

## 2019-03-07 NOTE — Plan of Care (Signed)

## 2019-03-07 NOTE — Progress Notes (Addendum)
On-call MD paged regarding the pt's request to change PO pain med to Dilaudid instead of oxycodone.   Milly Jakob, MD gave verbal orders for Dilaudid PO 2-4 mg q-4 PRN for severe pain and to d/c PRN oxycodone.

## 2019-03-07 NOTE — Care Management Obs Status (Signed)
[facility] NOTIFICATION   Patient Details  Name: Jonathan Powers MRN: [id number] Date of Birth: 01-Nov-1950   Medicare Observation Status Notification Given:  Yes    Claudine Mouton, LCSW 03/07/2019, 6:21 PM

## 2019-03-07 NOTE — Progress Notes (Addendum)
CSW completed CODE 44 under the direction of the Advanced Care Supervisor and CSW verbally delivered the CODE 44 to the pt and afterwards provided the pt with a paper copy of the CODE 44 to the pt's room.  Pt was appreciative and thanked the CSW.  CSW will continue to follow for D/C needs.  Alphonse Guild. Sonji Starkes, LCSW, LCAS, CSI Transitions of Care Clinical Social Worker Care Coordination Department Ph: [phone]

## 2019-03-08 DIAGNOSIS — G4733 Obstructive sleep apnea (adult) (pediatric): Secondary | ICD-10-CM | POA: Diagnosis not present

## 2019-03-08 DIAGNOSIS — Z96642 Presence of left artificial hip joint: Secondary | ICD-10-CM | POA: Diagnosis not present

## 2019-03-08 DIAGNOSIS — T8484XA Pain due to internal orthopedic prosthetic devices, implants and grafts, initial encounter: Secondary | ICD-10-CM | POA: Diagnosis not present

## 2019-03-08 DIAGNOSIS — I10 Essential (primary) hypertension: Secondary | ICD-10-CM | POA: Diagnosis not present

## 2019-03-08 DIAGNOSIS — T84061A Wear of articular bearing surface of internal prosthetic left hip joint, initial encounter: Secondary | ICD-10-CM | POA: Diagnosis not present

## 2019-03-08 DIAGNOSIS — M1612 Unilateral primary osteoarthritis, left hip: Secondary | ICD-10-CM | POA: Diagnosis not present

## 2019-03-08 LAB — BASIC METABOLIC PANEL
Anion gap: 8 (ref 5–15)
BUN: 16 mg/dL (ref 8–23)
CO2: 25 mmol/L (ref 22–32)
Calcium: 8.4 mg/dL — ABNORMAL LOW (ref 8.9–10.3)
Chloride: 101 mmol/L (ref 98–111)
Creatinine, Ser: 1.18 mg/dL (ref 0.61–1.24)
GFR calc Af Amer: >60 mL/min (ref >60)
GFR calc non Af Amer: >60 mL/min (ref >60)
Glucose, Bld: 156 mg/dL — ABNORMAL HIGH (ref 70–99)
Potassium: 4.7 mmol/L (ref 3.5–5.1)
Sodium: 134 mmol/L — ABNORMAL LOW (ref 135–145)

## 2019-03-08 LAB — CBC
HCT: 35.4 % — ABNORMAL LOW (ref 39.0–52.0)
Hemoglobin: 11.7 g/dL — ABNORMAL LOW (ref 13.0–17.0)
MCH: 30.1 pg (ref 26.0–34.0)
MCHC: 33.1 g/dL (ref 30.0–36.0)
MCV: 91 fL (ref 80.0–100.0)
Platelets: 230 10*3/uL (ref 150–400)
RBC: 3.89 MIL/uL — ABNORMAL LOW (ref 4.22–5.81)
RDW: 13.2 % (ref 11.5–15.5)
WBC: 11 10*3/uL — ABNORMAL HIGH (ref 4.0–10.5)
nRBC: 0 % (ref 0.0–0.2)

## 2019-03-08 MED ORDER — HYDROMORPHONE HCL 2 MG PO TABS: 2.0000 mg | ORAL_TABLET | ORAL | 0 refills | Status: AC | PRN

## 2019-03-08 NOTE — Progress Notes (Signed)
Physical Therapy Treatment Patient Details Name: Jonathan Powers MRN: [id number] DOB: 1950-06-22 Today's Date: 03/08/2019    History of Present Illness Pt s/p L THR revision and with hx of bil THR, R RCR and foot fusion    PT Comments    Pt very cooperative and progressing well with mobility.  Pt ambulating increased distance with noted improvement in stability and progression to reciprocal gait.     Follow Up Recommendations  Follow surgeon's recommendation for DC plan and follow-up therapies     Equipment Recommendations  None recommended by PT    Recommendations for Other Services       Precautions / Restrictions Precautions Precautions: Fall;Posterior Hip Precaution Comments: precautions reviewed Restrictions Weight Bearing Restrictions: No LLE Weight Bearing: Weight bearing as tolerated    Mobility  Bed Mobility Overal bed mobility: Needs Assistance Bed Mobility: Supine to Sit     Supine to sit: Min assist     General bed mobility comments: Pt up in chair and requests back to same  Transfers Overall transfer level: Needs assistance Equipment used: Rolling walker (2 wheeled) Transfers: Sit to/from Stand Sit to Stand: Min guard;Supervision         General transfer comment: cues for LE management and use of UEs to self assist  Ambulation/Gait Ambulation/Gait assistance: Min guard;Supervision Gait Distance (Feet): 200 Feet Assistive device: Rolling walker (2 wheeled) Gait Pattern/deviations: Decreased step length - right;Decreased step length - left;Shuffle;Trunk flexed;Step-to pattern;Step-through pattern Gait velocity: dec   General Gait Details: cues for sequence, posture and position from RW;    Stairs Stairs: Yes Stairs assistance: Min guard Stair Management: Two rails;Step to pattern;Forwards Number of Stairs: 3 General stair comments: pt self cues for sequence; pt cues to get complete foot on tread   Wheelchair Mobility    Modified  Rankin (Stroke Patients Only)       Balance Overall balance assessment: Needs assistance Sitting-balance support: No upper extremity supported;Feet supported Sitting balance-Leahy Scale: Good     Standing balance support: Bilateral upper extremity supported Standing balance-Leahy Scale: Fair                              Cognition Arousal/Alertness: Awake/alert Behavior During Therapy: WFL for tasks assessed/performed Overall Cognitive Status: Within Functional Limits for tasks assessed                                        Exercises Total Joint Exercises Ankle Circles/Pumps: AROM;Both;20 reps;Supine Quad Sets: AROM;Both;10 reps;Supine Heel Slides: AAROM;Left;20 reps;Supine Hip ABduction/ADduction: AAROM;Left;15 reps;Supine    General Comments        Pertinent Vitals/Pain Pain Assessment: 0-10 Pain Score: 4  Pain Location: L hip and groin Pain Descriptors / Indicators: Aching;Sore Pain Intervention(s): Limited activity within patient's tolerance;Monitored during session;Premedicated before session;Ice applied    Home Living Family/patient expects to be discharged to:: Private residence Living Arrangements: Spouse/significant other Available Help at Discharge: Friend(s) Type of Home: Apartment Home Access: Level entry   Home Layout: One level Home Equipment: Environmental consultant - 2 wheels;Cane - single point;Crutches;Bedside commode Additional Comments: Pt plans dc to friends apartment prior to return home    Prior Function Level of Independence: Independent          PT Goals (current goals can now be found in the care plan section) Acute Rehab PT Goals Patient  Stated Goal: Regain IND PT Goal Formulation: With patient Time For Goal Achievement: 03/15/19 Potential to Achieve Goals: Good Progress towards PT goals: Progressing toward goals    Frequency    7X/week      PT Plan Current plan remains appropriate    Co-evaluation               AM-PAC PT "6 Clicks" Mobility   Outcome Measure  Help needed turning from your back to your side while in a flat bed without using bedrails?: A Lot Help needed moving from lying on your back to sitting on the side of a flat bed without using bedrails?: A Little Help needed moving to and from a bed to a chair (including a wheelchair)?: A Little Help needed standing up from a chair using your arms (e.g., wheelchair or bedside chair)?: A Little Help needed to walk in hospital room?: A Little Help needed climbing 3-5 steps with a railing? : A Little 6 Click Score: 17    End of Session Equipment Utilized During Treatment: Gait belt Activity Tolerance: Patient tolerated treatment well Patient left: in chair;with call bell/phone within reach;with chair alarm set Nurse Communication: Mobility status PT Visit Diagnosis: Difficulty in walking, not elsewhere classified (R26.2)     Time: [id number] PT Time Calculation (min) (ACUTE ONLY): 17 min  Charges:  $Gait Training: 8-22 mins $Therapeutic Exercise: 8-22 mins                     [place] Pager [phone] Office [phone]    Norman Piacentini 03/08/2019, 2:58 PM

## 2019-03-08 NOTE — Discharge Summary (Addendum)
Physician Discharge Summary  Patient ID: Jonathan Powers MRN: [id number] DOB/AGE: 11/11/1950 69 y.o.  Admit date: 03/07/2019 Discharge date: 03/08/2019  Admission Diagnoses:  Failed total hip arthroplasty [facility])  Discharge Diagnoses:  Principal Problem:   Failed total hip arthroplasty ([place]) Active Problems:   S/P revision of total hip   Past Medical History:  Diagnosis Date  . ALLERGIC RHINITIS   . Cancer ([place])    Melonoma back  . CELLULITIS, LEG, RIGHT    Recurrent R hip cellulitis 1/08,3/08   . Diverticulitis   . GERD (gastroesophageal reflux disease)   . Gout   . Hashimoto's thyroiditis   . Hypertension   . Hypogonadism male    low T, a/w ED  . Hypothyroid    goiters   . Left thyroid nodule 2008   on [place], decrease size in 2011 [place]  . Migraines    Atypical and ocular  . OSTEOARTHRITIS   . Seasonal allergies   . Sleep apnea    cpap    Surgeries: Procedure(s): LEFT TOTAL HIP REVISION POSTERIOR APPROACH on 03/07/2019   Consultants (if any):   Discharged Condition: Improved  Hospital Course: Jonathan Powers is an 69 y.o. male who was admitted 03/07/2019 with a diagnosis of Failed total hip arthroplasty ([place]) and went to the operating room on 03/07/2019 and underwent the above named procedures.    He was given perioperative antibiotics:  Anti-infectives (From admission, onward)   Start     Dose/Rate Route Frequency Ordered Stop   03/07/19 1644  valACYclovir (VALTREX) tablet 500 mg     500 mg Oral Daily PRN 03/07/19 1644     03/07/19 0600  vancomycin (VANCOREADY) IVPB 1500 mg/300 mL     1,500 mg 150 mL/hr over 120 Minutes Intravenous On call to O.R. 03/06/19 0735 03/07/19 1236    .  He was given sequential compression devices, early ambulation, for DVT prophylaxis.  He benefited maximally from the hospital stay and there were no complications.  On the day of d/c, he was tol PO, voiding, dressing with small amount of drying sanguinous drainage, and could detect  LT sens on P/D/1web of left foot, flex/ext left ankle/toes including great toe, and cleared PT for d/c home.  Recent vital signs:  Vitals:   03/08/19 0106 03/08/19 0541  BP: 105/65 (!) 90/51  Pulse: 66 (!) 59  Resp: 16 16  Temp: 98.6 F (37 C) 98.4 F (36.9 C)  SpO2: 94% 95%    Recent laboratory studies:  Lab Results  Component Value Date   HGB 11.7 (L) 03/08/2019   HGB 14.4 11/26/2018   HGB 15.7 11/19/2018   Lab Results  Component Value Date   WBC 11.0 (H) 03/08/2019   PLT 230 03/08/2019   Lab Results  Component Value Date   INR 1.0 03/05/2019   Lab Results  Component Value Date   NA 134 (L) 03/08/2019   K 4.7 03/08/2019   CL 101 03/08/2019   CO2 25 03/08/2019   BUN 16 03/08/2019   CREATININE 1.18 03/08/2019   GLUCOSE 156 (H) 03/08/2019    Discharge Medications:   Allergies as of 03/08/2019      Reactions   Nsaids Shortness Of Breath   Naproxen specifically causes SOB/wheeze tolerates topical diclofenac without problem Tylenol OK   Tolmetin Shortness Of Breath   Naproxen specifically causes SOB/wheeze tolerates topical diclofenac without problem Tylenol OK   Penicillins Rash   Did it involve swelling of the face/tongue/throat, SOB, or  low BP? No Did it involve sudden or severe rash/hives, skin peeling, or any reaction on the inside of your mouth or nose? No Did you need to seek medical attention at a hospital or doctor's office? No When did it last happen? If all above answers are "NO", may proceed with cephalosporin use.      Medication List    STOP taking these medications   methocarbamol 500 MG tablet Commonly known as: ROBAXIN   traMADol 50 MG tablet Commonly known as: ULTRAM     TAKE these medications   acetaminophen 500 MG tablet Commonly known as: TYLENOL Take 1,000 mg by mouth every 6 (six) hours as needed for moderate pain.   amLODipine 2.5 MG tablet Commonly known as: NORVASC Take 1 tablet (2.5 mg total) by mouth daily.    apixaban 2.5 MG Tabs tablet Commonly known as: Eliquis Take 1 tablet (2.5 mg total) by mouth 2 (two) times daily.   cetirizine 10 MG tablet Commonly known as: ZYRTEC Take 10 mg by mouth daily.   diclofenac sodium 1 % Gel Commonly known as: VOLTAREN 2-4 grams qid as needed for pain as discussed in office What changed:   how much to take  how to take this  when to take this  reasons to take this  additional instructions   doxycycline 100 MG capsule Commonly known as: VIBRAMYCIN Take 1 capsule (100 mg total) by mouth 2 (two) times daily.   hydrocortisone-pramoxine rectal foam Commonly known as: Proctofoam HC Place 1 applicator rectally 2 (two) times daily.   HYDROmorphone 2 MG tablet Commonly known as: Dilaudid Take 1-2 tablets (2-4 mg total) by mouth every 4 (four) hours as needed for up to 5 days for severe pain (postop pain).   levothyroxine 175 MCG tablet Commonly known as: SYNTHROID Take 1 tablet (175 mcg total) by mouth daily before breakfast.   ondansetron 8 MG disintegrating tablet Commonly known as: Zofran ODT Take 1 tablet (8 mg total) by mouth every 8 (eight) hours as needed for nausea or vomiting.   rosuvastatin 5 MG tablet Commonly known as: CRESTOR Take 1 tablet (5 mg total) by mouth at bedtime.   tadalafil 5 MG tablet Commonly known as: CIALIS Take 5 mg by mouth at bedtime.   telmisartan 40 MG tablet Commonly known as: MICARDIS TAKE ONE TABLET BY MOUTH ONE TIME DAILY   Testosterone 1.62 % Gel Apply 3 Pump topically daily.   tiZANidine 2 MG tablet Commonly known as: ZANAFLEX Take 1 tablet (2 mg total) by mouth every 6 (six) hours as needed.   traZODone 100 MG tablet Commonly known as: DESYREL TAKE ONE HALF TABLET TO ONE TABLET BY MOUTH AT BEDTIME AS NEEDED FOR SLEEP What changed: See the new instructions.   valACYclovir 1000 MG tablet Commonly known as: VALTREX TAKE ONE TABLET BY MOUTH TWICE DAILY What changed:   how much to  take  when to take this  reasons to take this            Durable Medical Equipment  (From admission, onward)         Start     Ordered   03/07/19 1645  DME Walker rolling  Once    Question:  Patient needs a walker to treat with the following condition  Answer:  Status post total hip replacement, left   03/07/19 1644   03/07/19 1645  DME 3 n 1  Once     03/07/19 1644  Diagnostic Studies: DG Chest 2 View  Result Date: 03/05/2019 CLINICAL DATA:  Preoperative for left hip revision EXAM: CHEST - 2 VIEW COMPARISON:  11/01/2015 chest radiograph. FINDINGS: Stable cardiomediastinal silhouette with normal heart size. No pneumothorax. No pleural effusion. Lungs appear clear, with no acute consolidative airspace disease and no pulmonary edema. IMPRESSION: No active cardiopulmonary disease. Electronically Signed   By: Ilona Sorrel M.D.   On: 03/05/2019 16:56   DG Hip Port Unilat With Pelvis 1V Left  Result Date: 03/07/2019 CLINICAL DATA:  Revision of left total hip prosthesis. EXAM: DG HIP (WITH OR WITHOUT PELVIS) 1V PORT LEFT COMPARISON:  Radiograph dated 01/23/2019 FINDINGS: The left acetabular component has been revised. The femoral component appears unchanged. Right hip prosthesis is unchanged. Pelvic bones appear normal. IMPRESSION: Satisfactory appearance of the left hip after revision of the acetabular component of the left total hip prosthesis. Electronically Signed   By: Lorriane Shire M.D.   On: 03/07/2019 16:02    Disposition:     Follow-up Information    Frederik Pear, MD In 2 weeks.   Specialty: Orthopedic Surgery Contact information: [place] [address] [phone]            Signed: Jolyn Nap 03/08/2019, 8:51 AM

## 2019-03-08 NOTE — TOC Initial Note (Signed)
Transition of Care [facility]) - Initial/Assessment Note    Patient Details  Name: Jonathan Powers MRN: [id number] Date of Birth: Feb 11, 1951  Transition of Care [facility]) CM/SW Contact:    Joaquin Courts, RN Phone Number: 03/08/2019, 9:21 AM  Clinical Narrative:   CM spoke with patient. Patient set up with [facility] for [place]. [employer] to deliver rolling walker and 3-in-1 to bedside for home use.                Expected Discharge Plan: [place] Barriers to Discharge: No Barriers Identified   Patient Goals and CMS Choice Patient states their goals for this hospitalization and ongoing recovery are:: to go home CMS Medicare.gov Compare Post Acute Care list provided to:: Patient Choice offered to / list presented to : Patient  Expected Discharge Plan and Services Expected Discharge Plan: [place]   Discharge Planning Services: CM Consult Post Acute Care Choice: [place] arrangements for the past 2 months: Single Family Home Expected Discharge Date: 03/08/19               DME Arranged: [place] Rile rolling, 3-N-1 DME Agency: [employer] Date DME Agency Contacted: 03/08/19 Time DME Agency Contacted: [phone] Representative spoke with at DME Agency: [place]: PT [place]: [facility] (formerly [employer]) Date [place]: 03/08/19 Time [place]: 0920 Representative spoke with at [place]: [place] Arrangements/Services Living arrangements for the past 2 months: [place]   Patient language and need for interpreter reviewed:: Yes Do you feel safe going back to the place where you live?: Yes      Need for Family Participation in Patient Care: Yes (Comment) Care giver support system in place?: Yes (comment)   Criminal Activity/Legal Involvement Pertinent to Current Situation/Hospitalization: No - Comment as needed  Activities of Daily Living Home Assistive Devices/Equipment:  None, CPAP ADL Screening (condition at time of admission) Patient's cognitive ability adequate to safely complete daily activities?: Yes Is the patient deaf or have difficulty hearing?: No Does the patient have difficulty seeing, even when wearing glasses/contacts?: No Does the patient have difficulty concentrating, remembering, or making decisions?: No Patient able to express need for assistance with ADLs?: Yes Does the patient have difficulty dressing or bathing?: No Independently performs ADLs?: Yes (appropriate for developmental age) Does the patient have difficulty walking or climbing stairs?: No Weakness of Legs: None Weakness of Arms/Hands: None  Permission Sought/Granted   Permission granted to share information with : Yes, Verbal Permission Granted     Permission granted to share info w AGENCY: [facility]        Emotional Assessment Appearance:: Appears stated age Attitude/Demeanor/Rapport: Engaged Affect (typically observed): Accepting Orientation: : Oriented to Self, Oriented to Place, Oriented to  Time, Oriented to Situation   Psych Involvement: No (comment)  Admission diagnosis:  S/P revision of total hip [Z96.649] Patient Active Problem List   Diagnosis Date Noted  . S/P revision of total hip 03/07/2019  . Failed total hip arthroplasty ([place]) 03/06/2019  . Intestinal infection due to Clostridioides difficile 11/28/2018  . Diarrhea of presumed infectious origin 11/26/2018  . Recurrent genital HSV (herpes simplex virus) infection 11/12/2018  . HLD (hyperlipidemia) 11/11/2018  . Other specified hypothyroidism 11/11/2018  . Cellulitis of left lower extremity 11/10/2018  . Insomnia 11/10/2018  . GERD (gastroesophageal reflux disease) 11/10/2018  . Seasonal allergies 11/10/2018  . Recurrent oral herpes simplex 09/23/2018  .  Age-related osteoporosis with current pathological fracture 03/11/2018  . Flexural eczema 07/24/2017  . Routine general medical examination at a  health care facility 10/12/2016  . Chronic right-sided low back pain without sciatica 07/21/2016  . Low testosterone 07/21/2016  . Idiopathic chronic gout of multiple sites without tophus 07/21/2016  . Superior mesenteric artery aneurysm ([place]) 07/12/2015  . Left thyroid nodule   . OSA (obstructive sleep apnea) 01/27/2013  . Hypothyroid   . Symptomatic PVCs 09/06/2012  . Dyslipidemia, goal LDL below 130 09/06/2012  . BPH (benign prostatic hyperplasia) 09/06/2012  . OBESITY NOS 05/17/2006  . Essential hypertension 05/17/2006  . ALLERGIC RHINITIS 05/17/2006  . OSTEOARTHRITIS 05/17/2006   PCP:  [name], MD Pharmacy:   [facility] # [address] [place] [address] [place] [address] Phone: [phone] Fax: [phone]  [id number] [place], [place] [id number] [place] [id number] Phone: [phone] Fax: [phone]     Social Determinants of Health (SDOH) Interventions    Readmission Risk Interventions No flowsheet data found.

## 2019-03-08 NOTE — Evaluation (Signed)
Physical Therapy Evaluation Patient Details Name: Jonathan Powers MRN: [id number] DOB: Oct 19, 1950 Today's Date: 03/08/2019   History of Present Illness  Pt s/p L THR revision and with hx of bil THR, R RCR and foot fusion  Clinical Impression  Pt s/p L THR revision and presents with functional mobility limitations 2* decreased L LE strength/ROM, post op pain, obesity, and posterior THP.  Pt should progress to dc home with assist of friends/family.  Pt states HHPT to see him tomorrow.    Follow Up Recommendations Follow surgeon's recommendation for DC plan and follow-up therapies    Equipment Recommendations  None recommended by PT    Recommendations for Other Services       Precautions / Restrictions Precautions Precautions: Fall;Posterior Hip Restrictions Weight Bearing Restrictions: No LLE Weight Bearing: Weight bearing as tolerated      Mobility  Bed Mobility Overal bed mobility: Needs Assistance Bed Mobility: Supine to Sit     Supine to sit: Min assist     General bed mobility comments: cues for sequence and use of R LE to self assist  Transfers Overall transfer level: Needs assistance Equipment used: Rolling walker (2 wheeled) Transfers: Sit to/from Stand Sit to Stand: Min assist;Min guard         General transfer comment: cues for LE management and use of UEs to self assist  Ambulation/Gait Ambulation/Gait assistance: Min assist;Min guard Gait Distance (Feet): 140 Feet Assistive device: Rolling walker (2 wheeled) Gait Pattern/deviations: Step-to pattern;Decreased step length - right;Decreased step length - left;Shuffle;Trunk flexed Gait velocity: dec   General Gait Details: cues for sequence, posture and position from RW;   Science writer    Modified Rankin (Stroke Patients Only)       Balance Overall balance assessment: Needs assistance Sitting-balance support: No upper extremity supported;Feet supported Sitting  balance-Leahy Scale: Good     Standing balance support: Bilateral upper extremity supported Standing balance-Leahy Scale: Poor                               Pertinent Vitals/Pain Pain Assessment: 0-10 Pain Score: 5  Pain Location: L hip and groin Pain Descriptors / Indicators: Aching;Sore Pain Intervention(s): Limited activity within patient's tolerance;Monitored during session;Premedicated before session;Ice applied    Home Living Family/patient expects to be discharged to:: Private residence Living Arrangements: Spouse/significant other Available Help at Discharge: Friend(s) Type of Home: Apartment Home Access: Level entry     Home Layout: One level Home Equipment: Environmental consultant - 2 wheels;Cane - single point;Crutches;Bedside commode Additional Comments: Pt plans dc to friends apartment prior to return home    Prior Function Level of Independence: Independent               Hand Dominance        Extremity/Trunk Assessment   Upper Extremity Assessment Upper Extremity Assessment: Overall WFL for tasks assessed    Lower Extremity Assessment Lower Extremity Assessment: LLE deficits/detail LLE Deficits / Details: AAROM at hip to 80 flex and 20 abd; strength at hip 2+/5       Communication   Communication: No difficulties  Cognition Arousal/Alertness: Awake/alert Behavior During Therapy: WFL for tasks assessed/performed Overall Cognitive Status: Within Functional Limits for tasks assessed  General Comments      Exercises Total Joint Exercises Ankle Circles/Pumps: AROM;Both;20 reps;Supine Quad Sets: AROM;Both;10 reps;Supine Heel Slides: AAROM;Left;20 reps;Supine Hip ABduction/ADduction: AAROM;Left;15 reps;Supine   Assessment/Plan    PT Assessment Patient needs continued PT services  PT Problem List Decreased strength;Decreased range of motion;Decreased activity tolerance;Decreased  balance;Decreased mobility;Decreased knowledge of use of DME;Obesity;Pain;Decreased knowledge of precautions       PT Treatment Interventions DME instruction;Gait training;Stair training;Functional mobility training;Therapeutic activities;Therapeutic exercise;Patient/family education    PT Goals (Current goals can be found in the Care Plan section)  Acute Rehab PT Goals Patient Stated Goal: Regain IND PT Goal Formulation: With patient Time For Goal Achievement: 03/15/19 Potential to Achieve Goals: Good    Frequency 7X/week   Barriers to discharge        Co-evaluation               AM-PAC PT "6 Clicks" Mobility  Outcome Measure Help needed turning from your back to your side while in a flat bed without using bedrails?: A Lot Help needed moving from lying on your back to sitting on the side of a flat bed without using bedrails?: A Little Help needed moving to and from a bed to a chair (including a wheelchair)?: A Little Help needed standing up from a chair using your arms (e.g., wheelchair or bedside chair)?: A Little Help needed to walk in hospital room?: A Little Help needed climbing 3-5 steps with a railing? : A Lot 6 Click Score: 16    End of Session Equipment Utilized During Treatment: Gait belt Activity Tolerance: Patient tolerated treatment well Patient left: in chair;with call bell/phone within reach;with chair alarm set Nurse Communication: Mobility status PT Visit Diagnosis: Difficulty in walking, not elsewhere classified (R26.2)    Time: 0914-1000 PT Time Calculation (min) (ACUTE ONLY): 46 min   Charges:   PT Evaluation $PT Eval Low Complexity: 1 Low PT Treatments $Gait Training: 8-22 mins $Therapeutic Exercise: 8-22 mins        Debe Coder PT Acute Rehabilitation Services Pager [phone] Office [phone]   Lida Berkery 03/08/2019, 2:50 PM

## 2019-03-09 DIAGNOSIS — G43909 Migraine, unspecified, not intractable, without status migrainosus: Secondary | ICD-10-CM | POA: Diagnosis not present

## 2019-03-09 DIAGNOSIS — Z96641 Presence of right artificial hip joint: Secondary | ICD-10-CM | POA: Diagnosis not present

## 2019-03-09 DIAGNOSIS — G8929 Other chronic pain: Secondary | ICD-10-CM | POA: Diagnosis not present

## 2019-03-09 DIAGNOSIS — M810 Age-related osteoporosis without current pathological fracture: Secondary | ICD-10-CM | POA: Diagnosis not present

## 2019-03-09 DIAGNOSIS — Z7901 Long term (current) use of anticoagulants: Secondary | ICD-10-CM | POA: Diagnosis not present

## 2019-03-09 DIAGNOSIS — Z471 Aftercare following joint replacement surgery: Secondary | ICD-10-CM | POA: Diagnosis not present

## 2019-03-09 DIAGNOSIS — M1A09X0 Idiopathic chronic gout, multiple sites, without tophus (tophi): Secondary | ICD-10-CM | POA: Diagnosis not present

## 2019-03-09 DIAGNOSIS — E785 Hyperlipidemia, unspecified: Secondary | ICD-10-CM | POA: Diagnosis not present

## 2019-03-09 DIAGNOSIS — Z9181 History of falling: Secondary | ICD-10-CM | POA: Diagnosis not present

## 2019-03-09 DIAGNOSIS — I10 Essential (primary) hypertension: Secondary | ICD-10-CM | POA: Diagnosis not present

## 2019-03-09 DIAGNOSIS — E063 Autoimmune thyroiditis: Secondary | ICD-10-CM | POA: Diagnosis not present

## 2019-03-09 DIAGNOSIS — G4700 Insomnia, unspecified: Secondary | ICD-10-CM | POA: Diagnosis not present

## 2019-03-09 DIAGNOSIS — E039 Hypothyroidism, unspecified: Secondary | ICD-10-CM | POA: Diagnosis not present

## 2019-03-09 DIAGNOSIS — Z6841 Body Mass Index (BMI) 40.0 and over, adult: Secondary | ICD-10-CM | POA: Diagnosis not present

## 2019-03-09 DIAGNOSIS — G4733 Obstructive sleep apnea (adult) (pediatric): Secondary | ICD-10-CM | POA: Diagnosis not present

## 2019-03-09 DIAGNOSIS — H9190 Unspecified hearing loss, unspecified ear: Secondary | ICD-10-CM | POA: Diagnosis not present

## 2019-03-09 DIAGNOSIS — I728 Aneurysm of other specified arteries: Secondary | ICD-10-CM | POA: Diagnosis not present

## 2019-03-09 DIAGNOSIS — Z85820 Personal history of malignant melanoma of skin: Secondary | ICD-10-CM | POA: Diagnosis not present

## 2019-03-09 DIAGNOSIS — N400 Enlarged prostate without lower urinary tract symptoms: Secondary | ICD-10-CM | POA: Diagnosis not present

## 2019-03-09 DIAGNOSIS — E041 Nontoxic single thyroid nodule: Secondary | ICD-10-CM | POA: Diagnosis not present

## 2019-03-09 DIAGNOSIS — M545 Low back pain: Secondary | ICD-10-CM | POA: Diagnosis not present

## 2019-03-09 DIAGNOSIS — E669 Obesity, unspecified: Secondary | ICD-10-CM | POA: Diagnosis not present

## 2019-03-09 DIAGNOSIS — T84091D Other mechanical complication of internal left hip prosthesis, subsequent encounter: Secondary | ICD-10-CM | POA: Diagnosis not present

## 2019-03-10 ENCOUNTER — Telehealth: Payer: Self-pay | Admitting: *Deleted

## 2019-03-10 NOTE — Anesthesia Postprocedure Evaluation (Signed)
Anesthesia Post Note  Patient: Jonathan Powers  Procedure(s) Performed: LEFT TOTAL HIP REVISION POSTERIOR APPROACH (Left Hip)     Patient location during evaluation: PACU Anesthesia Type: General Level of consciousness: awake and alert Pain management: pain level controlled Vital Signs Assessment: post-procedure vital signs reviewed and stable Respiratory status: spontaneous breathing, nonlabored ventilation, respiratory function stable and patient connected to nasal cannula oxygen Cardiovascular status: blood pressure returned to baseline and stable Postop Assessment: no apparent nausea or vomiting Anesthetic complications: no    Last Vitals:  Vitals:   03/08/19 0541 03/08/19 1015  BP: (!) 90/51 (!) 107/57  Pulse: (!) 59 70  Resp: 16 16  Temp: 36.9 C 36.8 C  SpO2: 95% 96%    Last Pain:  Vitals:   03/08/19 1408  TempSrc:   PainSc: 4                  [name] S

## 2019-03-10 NOTE — Telephone Encounter (Signed)
Pt was on TCM report admitted 03/07/19 for Failed total hip arthroplasty. Pt underwent LEFT TOTAL HIP REVISION POSTERIOR APPROACH , and was D/C 03/08/19. Pt will follow=up w/Dr. Mayer Camel in 2 weeks.Mar[place] KitchenJohny Chess

## 2019-03-12 DIAGNOSIS — I10 Essential (primary) hypertension: Secondary | ICD-10-CM | POA: Diagnosis not present

## 2019-03-12 DIAGNOSIS — M545 Low back pain: Secondary | ICD-10-CM | POA: Diagnosis not present

## 2019-03-12 DIAGNOSIS — M810 Age-related osteoporosis without current pathological fracture: Secondary | ICD-10-CM | POA: Diagnosis not present

## 2019-03-12 DIAGNOSIS — T84091D Other mechanical complication of internal left hip prosthesis, subsequent encounter: Secondary | ICD-10-CM | POA: Diagnosis not present

## 2019-03-12 DIAGNOSIS — G8929 Other chronic pain: Secondary | ICD-10-CM | POA: Diagnosis not present

## 2019-03-12 DIAGNOSIS — M1A09X0 Idiopathic chronic gout, multiple sites, without tophus (tophi): Secondary | ICD-10-CM | POA: Diagnosis not present

## 2019-03-14 DIAGNOSIS — M810 Age-related osteoporosis without current pathological fracture: Secondary | ICD-10-CM | POA: Diagnosis not present

## 2019-03-14 DIAGNOSIS — M1A09X0 Idiopathic chronic gout, multiple sites, without tophus (tophi): Secondary | ICD-10-CM | POA: Diagnosis not present

## 2019-03-14 DIAGNOSIS — I10 Essential (primary) hypertension: Secondary | ICD-10-CM | POA: Diagnosis not present

## 2019-03-14 DIAGNOSIS — M545 Low back pain: Secondary | ICD-10-CM | POA: Diagnosis not present

## 2019-03-14 DIAGNOSIS — T84091D Other mechanical complication of internal left hip prosthesis, subsequent encounter: Secondary | ICD-10-CM | POA: Diagnosis not present

## 2019-03-14 DIAGNOSIS — G8929 Other chronic pain: Secondary | ICD-10-CM | POA: Diagnosis not present

## 2019-03-17 ENCOUNTER — Encounter: Payer: Self-pay | Admitting: *Deleted

## 2019-03-17 DIAGNOSIS — I10 Essential (primary) hypertension: Secondary | ICD-10-CM | POA: Diagnosis not present

## 2019-03-17 DIAGNOSIS — T84091D Other mechanical complication of internal left hip prosthesis, subsequent encounter: Secondary | ICD-10-CM | POA: Diagnosis not present

## 2019-03-17 DIAGNOSIS — M545 Low back pain: Secondary | ICD-10-CM | POA: Diagnosis not present

## 2019-03-17 DIAGNOSIS — M1A09X0 Idiopathic chronic gout, multiple sites, without tophus (tophi): Secondary | ICD-10-CM | POA: Diagnosis not present

## 2019-03-17 DIAGNOSIS — M810 Age-related osteoporosis without current pathological fracture: Secondary | ICD-10-CM | POA: Diagnosis not present

## 2019-03-17 DIAGNOSIS — G8929 Other chronic pain: Secondary | ICD-10-CM | POA: Diagnosis not present

## 2019-03-18 DIAGNOSIS — Z471 Aftercare following joint replacement surgery: Secondary | ICD-10-CM | POA: Diagnosis not present

## 2019-03-18 DIAGNOSIS — Z96642 Presence of left artificial hip joint: Secondary | ICD-10-CM | POA: Diagnosis not present

## 2019-03-19 DIAGNOSIS — I10 Essential (primary) hypertension: Secondary | ICD-10-CM | POA: Diagnosis not present

## 2019-03-19 DIAGNOSIS — G8929 Other chronic pain: Secondary | ICD-10-CM | POA: Diagnosis not present

## 2019-03-19 DIAGNOSIS — M810 Age-related osteoporosis without current pathological fracture: Secondary | ICD-10-CM | POA: Diagnosis not present

## 2019-03-19 DIAGNOSIS — M545 Low back pain: Secondary | ICD-10-CM | POA: Diagnosis not present

## 2019-03-19 DIAGNOSIS — M1A09X0 Idiopathic chronic gout, multiple sites, without tophus (tophi): Secondary | ICD-10-CM | POA: Diagnosis not present

## 2019-03-19 DIAGNOSIS — T84091D Other mechanical complication of internal left hip prosthesis, subsequent encounter: Secondary | ICD-10-CM | POA: Diagnosis not present

## 2019-03-21 DIAGNOSIS — M810 Age-related osteoporosis without current pathological fracture: Secondary | ICD-10-CM | POA: Diagnosis not present

## 2019-03-21 DIAGNOSIS — G8929 Other chronic pain: Secondary | ICD-10-CM | POA: Diagnosis not present

## 2019-03-21 DIAGNOSIS — M545 Low back pain: Secondary | ICD-10-CM | POA: Diagnosis not present

## 2019-03-21 DIAGNOSIS — T84091D Other mechanical complication of internal left hip prosthesis, subsequent encounter: Secondary | ICD-10-CM | POA: Diagnosis not present

## 2019-03-21 DIAGNOSIS — M1A09X0 Idiopathic chronic gout, multiple sites, without tophus (tophi): Secondary | ICD-10-CM | POA: Diagnosis not present

## 2019-03-21 DIAGNOSIS — I10 Essential (primary) hypertension: Secondary | ICD-10-CM | POA: Diagnosis not present

## 2019-04-01 DIAGNOSIS — Z471 Aftercare following joint replacement surgery: Secondary | ICD-10-CM | POA: Diagnosis not present

## 2019-04-01 DIAGNOSIS — Z96642 Presence of left artificial hip joint: Secondary | ICD-10-CM | POA: Diagnosis not present

## 2019-04-02 DIAGNOSIS — H259 Unspecified age-related cataract: Secondary | ICD-10-CM | POA: Diagnosis not present

## 2019-04-02 DIAGNOSIS — H04123 Dry eye syndrome of bilateral lacrimal glands: Secondary | ICD-10-CM | POA: Diagnosis not present

## 2019-04-02 DIAGNOSIS — H35033 Hypertensive retinopathy, bilateral: Secondary | ICD-10-CM | POA: Diagnosis not present

## 2019-04-02 DIAGNOSIS — H0102B Squamous blepharitis left eye, upper and lower eyelids: Secondary | ICD-10-CM | POA: Diagnosis not present

## 2019-04-02 DIAGNOSIS — H0102A Squamous blepharitis right eye, upper and lower eyelids: Secondary | ICD-10-CM | POA: Diagnosis not present

## 2019-04-02 DIAGNOSIS — H43393 Other vitreous opacities, bilateral: Secondary | ICD-10-CM | POA: Diagnosis not present

## 2019-04-02 DIAGNOSIS — H40013 Open angle with borderline findings, low risk, bilateral: Secondary | ICD-10-CM | POA: Diagnosis not present

## 2019-04-08 DIAGNOSIS — Z471 Aftercare following joint replacement surgery: Secondary | ICD-10-CM | POA: Diagnosis not present

## 2019-05-15 ENCOUNTER — Other Ambulatory Visit: Payer: Self-pay

## 2019-05-15 ENCOUNTER — Encounter (HOSPITAL_COMMUNITY): Payer: Self-pay

## 2019-05-15 ENCOUNTER — Emergency Department (HOSPITAL_COMMUNITY)
Admission: EM | Admit: 2019-05-15 | Discharge: 2019-05-16 | Disposition: A | Discharge status: Home / Self Care | Payer: Medicare Other | Attending: Emergency Medicine | Admitting: Emergency Medicine

## 2019-05-15 ENCOUNTER — Emergency Department (HOSPITAL_COMMUNITY): Payer: Medicare Other

## 2019-05-15 DIAGNOSIS — E039 Hypothyroidism, unspecified: Secondary | ICD-10-CM | POA: Insufficient documentation

## 2019-05-15 DIAGNOSIS — Z79899 Other long term (current) drug therapy: Secondary | ICD-10-CM | POA: Diagnosis not present

## 2019-05-15 DIAGNOSIS — R0602 Shortness of breath: Secondary | ICD-10-CM | POA: Diagnosis not present

## 2019-05-15 DIAGNOSIS — R0789 Other chest pain: Secondary | ICD-10-CM | POA: Diagnosis present

## 2019-05-15 DIAGNOSIS — R072 Precordial pain: Secondary | ICD-10-CM | POA: Diagnosis not present

## 2019-05-15 DIAGNOSIS — Z7901 Long term (current) use of anticoagulants: Secondary | ICD-10-CM | POA: Insufficient documentation

## 2019-05-15 DIAGNOSIS — Z96642 Presence of left artificial hip joint: Secondary | ICD-10-CM | POA: Diagnosis not present

## 2019-05-15 DIAGNOSIS — Z96641 Presence of right artificial hip joint: Secondary | ICD-10-CM | POA: Insufficient documentation

## 2019-05-15 DIAGNOSIS — I10 Essential (primary) hypertension: Secondary | ICD-10-CM | POA: Insufficient documentation

## 2019-05-15 DIAGNOSIS — R079 Chest pain, unspecified: Secondary | ICD-10-CM | POA: Diagnosis not present

## 2019-05-15 LAB — BASIC METABOLIC PANEL
Anion gap: 9 (ref 5–15)
BUN: 9 mg/dL (ref 8–23)
CO2: 28 mmol/L (ref 22–32)
Calcium: 9.2 mg/dL (ref 8.9–10.3)
Chloride: 105 mmol/L (ref 98–111)
Creatinine, Ser: 1.29 mg/dL — ABNORMAL HIGH (ref 0.61–1.24)
GFR calc Af Amer: >60 mL/min (ref >60)
GFR calc non Af Amer: 57 mL/min — ABNORMAL LOW (ref >60)
Glucose, Bld: 105 mg/dL — ABNORMAL HIGH (ref 70–99)
Potassium: 4.2 mmol/L (ref 3.5–5.1)
Sodium: 142 mmol/L (ref 135–145)

## 2019-05-15 LAB — CBC
HCT: 44.7 % (ref 39.0–52.0)
Hemoglobin: 14.9 g/dL (ref 13.0–17.0)
MCH: 29.6 pg (ref 26.0–34.0)
MCHC: 33.3 g/dL (ref 30.0–36.0)
MCV: 88.7 fL (ref 80.0–100.0)
Platelets: 259 10*3/uL (ref 150–400)
RBC: 5.04 MIL/uL (ref 4.22–5.81)
RDW: 12.5 % (ref 11.5–15.5)
WBC: 7.6 10*3/uL (ref 4.0–10.5)
nRBC: 0 % (ref 0.0–0.2)

## 2019-05-15 LAB — TROPONIN I (HIGH SENSITIVITY)
Troponin I (High Sensitivity): 19 ng/L — ABNORMAL HIGH (ref <18)
Troponin I (High Sensitivity): 18 ng/L — ABNORMAL HIGH (ref <18)

## 2019-05-15 MED ORDER — SODIUM CHLORIDE 0.9% FLUSH: 3.0000 mL | Freq: Once | INTRAVENOUS | Status: DC

## 2019-05-15 NOTE — ED Triage Notes (Signed)
Pt arrives to ED w/ c/o L sided chest pain 6/10 and SOB x 3 days. Denies N/V, dizziness, syncope.

## 2019-05-16 NOTE — Discharge Instructions (Addendum)

## 2019-05-16 NOTE — ED Notes (Signed)
Ambulatory to and from BR with strong, steady gait

## 2019-05-16 NOTE — ED Notes (Signed)
Dr. Wickline at bedside.  

## 2019-05-16 NOTE — ED Notes (Signed)
Pt reports onset of chest pain radiating to L arm and L sided jaw tingling that started two hours PTA. Pt reports pain mostly resolved now. Denies N/V. Denies dizziness. Denies SOB presently. PIV initiated, 20 G to L forearm. IV flushes with 10 cc NS without s/s of infiltration. Positive blood return noted. Secured with tape and tegaderm

## 2019-05-16 NOTE — ED Provider Notes (Signed)
[facility] EMERGENCY DEPARTMENT Provider Note   CSN: [id number] Arrival date & time: 05/15/19  2026     History Chief Complaint  Patient presents with  . Chest Pain    Jonathan Powers is a 69 y.o. male.  The history is provided by the patient.  Chest Pain Pain location:  L chest Pain radiates to:  L arm Pain severity:  Moderate Onset quality:  Gradual Duration:  3 days Timing:  Intermittent Progression:  Improving Chronicity:  New Relieved by:  Nothing Worsened by:  Nothing Associated symptoms: shortness of breath   Associated symptoms: no diaphoresis, no fever and no vomiting     HPI: A 69 year old patient with a history of hypertension and hypercholesterolemia presents for evaluation of chest pain. Initial onset of pain was more than 6 hours ago. The patient's chest pain is described as heaviness/pressure/tightness and is not worse with exertion. The patient's chest pain is middle- or left-sided, is not well-localized, is not sharp and does radiate to the arms/jaw/neck. The patient does not complain of nausea and denies diaphoresis. The patient has a family history of coronary artery disease in a first-degree relative with onset less than age 81. The patient has no history of stroke, has no history of peripheral artery disease, has not smoked in the past 90 days, denies any history of treated diabetes and does not have an elevated BMI (>=30).  Patient reports intermittent chest pain for the past 3 days.  It is not worsened by exertion.  He has mild shortness of breath.  He is feeling well at this time.  Past Medical History:  Diagnosis Date  . ALLERGIC RHINITIS   . Cancer ([place])    Melonoma back  . CELLULITIS, LEG, RIGHT    Recurrent R hip cellulitis 1/08,3/08   . Diverticulitis   . GERD (gastroesophageal reflux disease)   . Gout   . Hashimoto's thyroiditis   . Hypertension   . Hypogonadism male    low T, a/w ED  . Hypothyroid    goiters   .  Left thyroid nodule 2008   on [place], decrease size in 2011 [place]  . Migraines    Atypical and ocular  . OSTEOARTHRITIS   . Seasonal allergies   . Sleep apnea    cpap    Patient Active Problem List   Diagnosis Date Noted  . S/P revision of total hip 03/07/2019  . Failed total hip arthroplasty ([place]) 03/06/2019  . Intestinal infection due to Clostridioides difficile 11/28/2018  . Diarrhea of presumed infectious origin 11/26/2018  . Recurrent genital HSV (herpes simplex virus) infection 11/12/2018  . HLD (hyperlipidemia) 11/11/2018  . Other specified hypothyroidism 11/11/2018  . Cellulitis of left lower extremity 11/10/2018  . Insomnia 11/10/2018  . GERD (gastroesophageal reflux disease) 11/10/2018  . Seasonal allergies 11/10/2018  . Recurrent oral herpes simplex 09/23/2018  . Age-related osteoporosis with current pathological fracture 03/11/2018  . Flexural eczema 07/24/2017  . Routine general medical examination at a health care facility 10/12/2016  . Chronic right-sided low back pain without sciatica 07/21/2016  . Low testosterone 07/21/2016  . Idiopathic chronic gout of multiple sites without tophus 07/21/2016  . Superior mesenteric artery aneurysm ([place]) 07/12/2015  . Left thyroid nodule   . OSA (obstructive sleep apnea) 01/27/2013  . Hypothyroid   . Symptomatic PVCs 09/06/2012  . Dyslipidemia, goal LDL below 130 09/06/2012  . BPH (benign prostatic hyperplasia) 09/06/2012  . OBESITY NOS 05/17/2006  . Essential hypertension 05/17/2006  .  ALLERGIC RHINITIS 05/17/2006  . OSTEOARTHRITIS 05/17/2006    Past Surgical History:  Procedure Laterality Date  . CARPAL TUNNEL RELEASE Left 03/30/2017   Procedure: CARPAL TUNNEL RELEASE;  Surgeon: Garald Balding, MD;  Location: [facility];  Service: Orthopedics;  Laterality: Left;  . CHOLECYSTECTOMY  2004  . CTR Bilateral 1982  . CYSTOSCOPY  1974  . FOOT FUSION Left 06/22/2017  . HEMORRHOID SURGERY N/A 03/30/2017    Procedure: HEMORRHOIDECTOMY;  Surgeon: Coralie Keens, MD;  Location: [facility];  Service: General;  Laterality: N/A;  . open reduction L little finger Left 1970  . Repair digital thumb, left Left 1990  . Right shoulder cuff repair Right 09/15/11  . Right shoulder SAD, DCR Right 02/05/11  . TOTAL HIP ARTHROPLASTY Left 07/26/06  . TOTAL HIP ARTHROPLASTY Right 1999  . TOTAL HIP REVISION Left 03/07/2019   Procedure: LEFT TOTAL HIP REVISION POSTERIOR APPROACH;  Surgeon: Frederik Pear, MD;  Location: [facility];  Service: Orthopedics;  Laterality: Left;       Family History  Problem Relation Age of Onset  . Arthritis Father   . Heart disease Father   . Diabetes Father   . Vascular Disease Father        AoBifem  . Arthritis Mother   . Diabetes Mother   . Multiple sclerosis Mother   . Cancer Paternal Grandmother        "GI"  . Cancer Paternal Grandfather        stomach cancer  . Thyroid cancer Sister   . Uterine cancer Sister     Social History   Tobacco Use  . Smoking status: Never Smoker  . Smokeless tobacco: Never Used  Substance Use Topics  . Alcohol use: Yes    Alcohol/week: 0.0 standard drinks    Comment: rare  . Drug use: No    Home Medications Prior to Admission medications   Medication Sig Start Date End Date Taking? Authorizing Provider  acetaminophen (TYLENOL) 500 MG tablet Take 1,000 mg by mouth every 6 (six) hours as needed for moderate pain.     [provider]  amLODipine (NORVASC) 2.5 MG tablet Take 1 tablet (2.5 mg total) by mouth daily. 02/28/19   Adrian Prows, MD  apixaban (ELIQUIS) 2.5 MG TABS tablet Take 1 tablet (2.5 mg total) by mouth 2 (two) times daily. 03/07/19   Leighton Parody, PA-C  cetirizine (ZYRTEC) 10 MG tablet Take 10 mg by mouth daily.    [provider]  diclofenac sodium (VOLTAREN) 1 % GEL 2-4 grams qid as needed for pain as discussed in office Patient taking differently: Apply 2 g topically 4 (four) times daily  as needed (pain).  07/22/18   Ofilia Neas, PA-C  doxycycline (VIBRAMYCIN) 100 MG capsule Take 1 capsule (100 mg total) by mouth 2 (two) times daily. Patient not taking: Reported on 02/28/2019 01/15/19   Wurst, [place], PA-C  hydrocortisone-pramoxine (PROCTOFOAM [facility]) rectal foam Place 1 applicator rectally 2 (two) times daily. Patient not taking: Reported on 02/28/2019 04/05/18   Marrian Salvage, FNP  levothyroxine (SYNTHROID) 175 MCG tablet Take 1 tablet (175 mcg total) by mouth daily before breakfast. 01/30/19   [name], MD  ondansetron (ZOFRAN-ODT) 8 MG disintegrating tablet Take 1 tablet (8 mg total) by mouth every 8 (eight) hours as needed for nausea or vomiting. 04/05/18   Marrian Salvage, FNP  rosuvastatin (CRESTOR) 5 MG tablet Take 1 tablet (5 mg total) by mouth  at bedtime. 02/19/19   [name], MD  tadalafil (CIALIS) 5 MG tablet Take 5 mg by mouth at bedtime.    [provider]  telmisartan (MICARDIS) 40 MG tablet TAKE ONE TABLET BY MOUTH ONE TIME DAILY  02/28/19   [name], MD  Testosterone 1.62 % GEL Apply 3 Pump topically daily. 10/02/18   [provider]  tiZANidine (ZANAFLEX) 2 MG tablet Take 1 tablet (2 mg total) by mouth every 6 (six) hours as needed. 03/07/19   Leighton Parody, PA-C  traZODone (DESYREL) 100 MG tablet TAKE ONE HALF TABLET TO ONE TABLET BY MOUTH AT BEDTIME AS NEEDED FOR SLEEP Patient taking differently: Take 50 mg by mouth at bedtime.  01/30/19   [name], MD  valACYclovir (VALTREX) 1000 MG tablet TAKE ONE TABLET BY MOUTH TWICE DAILY  Patient taking differently: Take 500 mg by mouth daily as needed.  11/29/18   [name], MD    Allergies    Nsaids, Tolmetin, and Penicillins  Review of Systems   Review of Systems  Constitutional: Negative for diaphoresis and fever.  Respiratory: Positive for shortness of breath.   Cardiovascular: Positive for chest pain.  Gastrointestinal: Negative for vomiting.  All  other systems reviewed and are negative.   Physical Exam Updated Vital Signs BP (!) 149/82   Pulse (!) 56   Temp 98.3 F (36.8 C) (Oral)   Resp 15   SpO2 96%   Physical Exam CONSTITUTIONAL: Well developed/well nourished HEAD: Normocephalic/atraumatic EYES: EOMI/PERRL ENMT: Mucous membranes moist NECK: supple no meningeal signs SPINE/BACK:entire spine nontender CV: S1/S2 noted, no murmurs/rubs/gallops noted LUNGS: Lungs are clear to auscultation bilaterally, no apparent distress Chest-no chest wall tenderness ABDOMEN: soft, nontender, no rebound or guarding, bowel sounds noted throughout abdomen GU:no cva tenderness NEURO: Pt is awake/alert/appropriate, moves all extremitiesx4.  No facial droop.   EXTREMITIES: pulses normal/equalx4, full ROM, no shoulder tenderness, no deformity, no erythema/warmth SKIN: warm, color normal PSYCH: no abnormalities of mood noted, alert and oriented to situation  ED Results / Procedures / Treatments   Labs (all labs ordered are listed, but only abnormal results are displayed) Labs Reviewed  BASIC METABOLIC PANEL - Abnormal; Notable for the following components:      Result Value   Glucose, Bld 105 (*)    Creatinine, Ser 1.29 (*)    GFR calc non Af Amer 57 (*)    All other components within normal limits  TROPONIN I (HIGH SENSITIVITY) - Abnormal; Notable for the following components:   Troponin I (High Sensitivity) 19 (*)    All other components within normal limits  TROPONIN I (HIGH SENSITIVITY) - Abnormal; Notable for the following components:   Troponin I (High Sensitivity) 18 (*)    All other components within normal limits  CBC    EKG EKG Interpretation  Date/Time:  Thursday May 15 2019 20:29:59 EDT Ventricular Rate:  61 PR Interval:  180 QRS Duration: 88 QT Interval:  404 QTC Calculation: 406 R Axis:   70 Text Interpretation: Normal sinus rhythm Normal ECG Confirmed by Ripley Fraise [phone]) on 05/16/2019 2:30:00  AM   Radiology DG Chest 2 View  Result Date: 05/15/2019 CLINICAL DATA:  Chest pain x1 day. EXAM: CHEST - 2 VIEW COMPARISON:  March 05, 2019 FINDINGS: Mild linear atelectasis is seen within the right lung base. There is no evidence of acute infiltrate, pleural effusion or pneumothorax. The heart size and mediastinal contours are within normal limits. The visualized  skeletal structures are unremarkable. IMPRESSION: Mild right basilar linear atelectasis without active cardiopulmonary disease. Electronically Signed   By: Virgina Norfolk M.D.   On: 05/15/2019 20:52    Procedures Procedures Medications Ordered in ED Medications  sodium chloride flush (NS) 0.9 % injection 3 mL (has no administration in time range)    ED Course  I have reviewed the triage vital signs and the nursing notes.  Pertinent labs & imaging results that were available during my care of the patient were reviewed by me and considered in my medical decision making (see chart for details).    MDM Rules/Calculators/A&P HEAR Score: 5                    3:58 AM  Patient presents with chest pain.  He reports he is feeling improved.  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  CT coronary last year revealed mild nonobstructive disease.  No acute EKG changes.  Troponins currently flat. D/w dr Birdena Jubilee with cardiology If the patient is feeling improved and feels comfortable discharge, he will arrange close follow-up.   Overall patient appears improved.  No acute distress.  troponins remained flat.  I did offer admission to patient, but he declines and feels he can follow closely with his cardiologist Dr. Einar Gip.  He will call him later in the day Low suspicion for PE/dissection. Final Clinical Impression(s) / ED Diagnoses Final diagnoses:  Precordial pain    Rx / DC Orders ED Discharge Orders    None       Ripley Fraise, MD 05/16/19 [phone]

## 2019-05-16 NOTE — ED Notes (Signed)
Patient verbalizes understanding of discharge instructions and follow up care. Opportunity for questioning and answers were provided. All questions answered completely. PIV removed, catheter intact. Site dressed with gauze and tape. Armband removed by staff, pt discharged from ED. Ambulatory from ED with strong, steady gait 

## 2019-05-18 ENCOUNTER — Other Ambulatory Visit: Payer: Self-pay | Admitting: Internal Medicine

## 2019-05-18 DIAGNOSIS — I10 Essential (primary) hypertension: Secondary | ICD-10-CM

## 2019-05-21 DIAGNOSIS — L821 Other seborrheic keratosis: Secondary | ICD-10-CM | POA: Diagnosis not present

## 2019-05-21 DIAGNOSIS — D225 Melanocytic nevi of trunk: Secondary | ICD-10-CM | POA: Diagnosis not present

## 2019-05-21 DIAGNOSIS — L718 Other rosacea: Secondary | ICD-10-CM | POA: Diagnosis not present

## 2019-05-21 DIAGNOSIS — D692 Other nonthrombocytopenic purpura: Secondary | ICD-10-CM | POA: Diagnosis not present

## 2019-05-30 ENCOUNTER — Ambulatory Visit: Payer: Medicare Other | Admitting: Cardiology

## 2019-05-30 ENCOUNTER — Encounter: Payer: Self-pay | Admitting: Cardiology

## 2019-05-30 ENCOUNTER — Other Ambulatory Visit: Payer: Self-pay

## 2019-05-30 VITALS — BP 152/90 | HR 63 | Temp 96.9°F | Resp 12 | Ht 70.5 in | Wt 280.0 lb

## 2019-05-30 DIAGNOSIS — E782 Mixed hyperlipidemia: Secondary | ICD-10-CM | POA: Diagnosis not present

## 2019-05-30 DIAGNOSIS — R0789 Other chest pain: Secondary | ICD-10-CM

## 2019-05-30 DIAGNOSIS — R931 Abnormal findings on diagnostic imaging of heart and coronary circulation: Secondary | ICD-10-CM

## 2019-05-30 DIAGNOSIS — I5032 Chronic diastolic (congestive) heart failure: Secondary | ICD-10-CM | POA: Diagnosis not present

## 2019-05-30 DIAGNOSIS — R0600 Dyspnea, unspecified: Secondary | ICD-10-CM

## 2019-05-30 DIAGNOSIS — R0609 Other forms of dyspnea: Secondary | ICD-10-CM | POA: Diagnosis not present

## 2019-05-30 DIAGNOSIS — I10 Essential (primary) hypertension: Secondary | ICD-10-CM | POA: Diagnosis not present

## 2019-05-30 MED ORDER — OLMESARTAN MEDOXOMIL-HCTZ 40-25 MG PO TABS: 1.0000 | ORAL_TABLET | Freq: Every day | ORAL | 3 refills | Status: DC

## 2019-05-30 NOTE — Progress Notes (Signed)
Primary Physician/Referring:  [name], MD  Referring Bo Merino, MD Patient ID: Jonathan Powers, male    DOB: [birth year], 69 y.o.   MRN: [id number]  Chief Complaint  Patient presents with  . Hypertension  . Wt management  . Follow-up    6 month   HPI:    Jonathan Powers  is a 69 y.o. Caucasian male with history of idiopathic gout, hypertension, mild hypertriglyceridemia and metabolic syndrome with elevated blood sugar without diabetes, moderate obesity, obstructive sleep apnea on CPAP evaluted by me for hypertension and chest pain and has had a Coronary CTA on 11/19/2018 which revealed mild luminal irregularity and a calcium score of 250.  He was seen in the emergency room on 05/15/2019 for left upper chest pain and shoulder pain with radiation to his left arm, however he also has chronic arthritis in his left arm.  He was concerned and presented to the emergency room where the serum troponin was minimally elevated without repeat troponin showing any delta change, EKG was normal, was discharged home with and advised to follow-up with me.  He has not had any recurrence since then.  Has noticed that his blood pressure has been elevated.  Dyspnea has remained stable, no PND or orthopnea.  Leg edema is chronic and stable.     Past Medical History:  Diagnosis Date  . ALLERGIC RHINITIS   . Cancer ([place])    Melonoma back  . CELLULITIS, LEG, RIGHT    Recurrent R hip cellulitis 1/08,3/08   . Diverticulitis   . GERD (gastroesophageal reflux disease)   . Gout   . Hashimoto's thyroiditis   . Hypertension   . Hypogonadism male    low T, a/w ED  . Hypothyroid    goiters   . Left thyroid nodule 2008   on [place], decrease size in 2011 [place]  . Migraines    Atypical and ocular  . OSTEOARTHRITIS   . Seasonal allergies   . Sleep apnea    cpap   Past Surgical History:  Procedure Laterality Date  . CARPAL TUNNEL RELEASE Left 03/30/2017   Procedure: CARPAL TUNNEL RELEASE;  Surgeon:  Garald Balding, MD;  Location: [facility];  Service: Orthopedics;  Laterality: Left;  . CHOLECYSTECTOMY  2004  . CTR Bilateral 1982  . CYSTOSCOPY  1974  . FOOT FUSION Left 06/22/2017  . HEMORRHOID SURGERY N/A 03/30/2017   Procedure: HEMORRHOIDECTOMY;  Surgeon: Coralie Keens, MD;  Location: [facility];  Service: General;  Laterality: N/A;  . open reduction L little finger Left 1970  . Repair digital thumb, left Left 1990  . Right shoulder cuff repair Right 09/15/11  . Right shoulder SAD, DCR Right 02/05/11  . TOTAL HIP ARTHROPLASTY Left 07/26/06  . TOTAL HIP ARTHROPLASTY Right 1999  . TOTAL HIP REVISION Left 03/07/2019   Procedure: LEFT TOTAL HIP REVISION POSTERIOR APPROACH;  Surgeon: Frederik Pear, MD;  Location: [facility];  Service: Orthopedics;  Laterality: Left;   Social History   Tobacco Use  . Smoking status: Never Smoker  . Smokeless tobacco: Never Used  Substance Use Topics  . Alcohol use: Yes    Alcohol/week: 0.0 standard drinks    Comment: rare   Marital Status: Married   ROS  Review of Systems  Cardiovascular: Positive for dyspnea on exertion. Negative for chest pain and leg swelling.  Musculoskeletal: Positive for arthritis, back pain, joint pain (neck and left shoulder) and neck pain.  Gastrointestinal: Negative  for melena.   Objective  Blood pressure (!) 152/90, pulse 63, temperature (!) 96.9 F (36.1 C), temperature source Oral, resp. rate 12, height 5' 10.5" (1.791 m), weight 280 lb (127 kg), SpO2 96 %. Body mass index is 39.61 kg/m.   Physical Exam  Constitutional: He appears well-developed. No distress.  Moderately obese  Neck:  Short neck and difficult to evaluate JVP  Cardiovascular: Normal rate, regular rhythm and normal heart sounds. Exam reveals no gallop.  No murmur heard. Pulses:      Carotid pulses are 2+ on the right side and 2+ on the left side.      Dorsalis pedis pulses are 2+ on the right side and 2+ on the left  side.       Posterior tibial pulses are 2+ on the right side and 2+ on the left side.  2+ bilateral pitting leg edema. No JVD.  Pulmonary/Chest: Effort normal and breath sounds normal.  Abdominal: Soft. Bowel sounds are normal.  Obese.    Laboratory examination:   Recent Labs    03/05/19 1250 03/08/19 0503 05/15/19 2036  NA 141 134* 142  K 4.3 4.7 4.2  CL 104 101 105  CO2 29 25 28   GLUCOSE 91 156* 105*  BUN 14 16 9   CREATININE 0.92 1.18 1.29*  CALCIUM 9.1 8.4* 9.2  GFRNONAA >60 >60 57*  GFRAA >60 >60 >60   CMP Latest Ref Rng & Units 05/15/2019 03/08/2019 03/05/2019  Glucose 70 - 99 mg/dL 105(H) 156(H) 91  BUN 8 - 23 mg/dL 9 16 14   Creatinine 0.61 - 1.24 mg/dL 1.29(H) 1.18 0.92  Sodium 135 - 145 mmol/L 142 134(L) 141  Potassium 3.5 - 5.1 mmol/L 4.2 4.7 4.3  Chloride 98 - 111 mmol/L 105 101 104  CO2 22 - 32 mmol/L 28 25 29   Calcium 8.9 - 10.3 mg/dL 9.2 8.4(L) 9.1  Total Protein 6.1 - 8.1 g/dL - - -  Total Bilirubin 0.2 - 1.2 mg/dL - - -  Alkaline Phos 38 - 126 U/L - - -  AST 10 - 35 U/L - - -  ALT 9 - 46 U/L - - -   CBC Latest Ref Rng & Units 05/15/2019 03/08/2019 11/26/2018  WBC 4.0 - 10.5 K/uL 7.6 11.0(H) 5.6  Hemoglobin 13.0 - 17.0 g/dL 14.9 11.7(L) 14.4  Hematocrit 39.0 - 52.0 % 44.7 35.4(L) 42.2  Platelets 150 - 400 K/uL 259 230 304.0   Lipid Panel     Component Value Date/Time   CHOL 108 11/19/2018 0931   TRIG 235 (H) 11/19/2018 0931   HDL 39 (L) 11/19/2018 0931   CHOLHDL 2.8 11/19/2018 0931   VLDL 35.6 02/05/2018 1629   LDLCALC 40 11/19/2018 0931   HEMOGLOBIN A1C Lab Results  Component Value Date   HGBA1C 5.1 11/09/2018   MPG 99.67 11/09/2018   TSH Recent Labs    07/09/18 1334 11/26/18 1506  TSH 1.18 1.22   Ref Range & Units 2 wk ago  (05/15/19) 2 wk ago  (05/15/19)   Troponin I (High Sensitivity) <18 ng/L 18High   19High  CM     Medications   Current Outpatient Medications  Medication Instructions  . acetaminophen (TYLENOL) 1,000 mg, Oral,  Every 6 hours PRN  . amLODipine (NORVASC) 2.5 mg, Oral, Daily  . cetirizine (ZYRTEC) 10 mg, Daily  . diclofenac sodium (VOLTAREN) 1 % GEL 2-4 grams qid as needed for pain as discussed in office  . levothyroxine (SYNTHROID) 175 mcg, Oral, Daily  before breakfast  . olmesartan-hydrochlorothiazide (BENICAR HCT) 40-25 MG tablet 1 tablet, Oral, Daily  . rosuvastatin (CRESTOR) 5 mg, Oral, Daily at bedtime  . tadalafil (CIALIS) 5 mg, Oral, Daily at bedtime  . Testosterone 1.62 % GEL 3 Pump, Topical, Daily  . traZODone (DESYREL) 100 MG tablet TAKE ONE HALF TABLET TO ONE TABLET BY MOUTH AT BEDTIME AS NEEDED FOR SLEEP  . valACYclovir (VALTREX) 1000 MG tablet TAKE ONE TABLET BY MOUTH TWICE DAILY    Radiology:   Coronary CTA 11/11/2018:  1. Minimal non-obstructive CAD, CADRADS = 0. 2. Coronary calcium score of 250.78. This was 66th percentile for age and sex matched control. 3. Normal coronary origin with right dominance. 4. Atherosclerotic calcification of the aorta.  Chest x-ray 05/15/2019: Mild right basilar linear atelectasis without active cardiopulmonary disease.  Cardiac Studies:   Lexiscan Myoview stress test 09/30/2012: No evidence of ischemia.  Normal   LVEF.  Echocardiogram 12/28/2018:  Left ventricle cavity is normal in size. Mild concentric hypertrophy of  the left ventricle. Normal LV systolic function with EF 59%. Normal global  wall motion. Doppler evidence of grade I (impaired) diastolic dysfunction,  normal LAP.  Left atrial cavity is mildly dilated.  Mild tricuspid regurgitation. Estimated pulmonary artery systolic pressure  is 26 mmHg.  IVC is dilated with respiratory variation. Estimated RA pressure 8 mmHg.  EKG:  EKG 05/30/2019: Normal sinus rhythm at rate of 60 bpm, normal axis.  No evidence of ischemia, normal EKG.   Assessment     ICD-10-CM   1. Musculoskeletal chest pain  R07.89   2. Agatston coronary artery calcium score between 200 and 399  R93.1   3.  Essential hypertension  I10 EKG 12-Lead    olmesartan-hydrochlorothiazide (BENICAR HCT) 40-25 MG tablet    Basic metabolic panel    Basic metabolic panel  4. Dyspnea on exertion  R06.00 Brain natriuretic peptide    Brain natriuretic peptide  5. Chronic diastolic (congestive) heart failure (HCC)  I50.32   6. Mixed hyperlipidemia  E78.2 Lipid Panel With LDL/HDL Ratio    Lipid Panel With LDL/HDL Ratio    Recommendations:   Jonathan Powers  is a 69 y.o. Caucasian male with history of idiopathic gout, hypertension, mild hypertriglyceridemia and metabolic syndrome with elevated blood sugar without diabetes, moderate obesity, obstructive sleep apnea on CPAP evaluted by me for hypertension and chest pain and has had a Coronary CTA on 11/19/2018 which revealed mild luminal irregularity and a calcium score of 250.  He was seen in the emergency room on 05/15/2019 for left upper chest pain and shoulder pain, I reviewed his chest x-ray, labs, do not think this is unstable angina.  No change in his physical exam, blood pressure remains elevated.  Due to leg edema, I will add olmesartan HCT and discontinue Telmisartan that he was previously on.  I reviewed his echocardiogram.  Suspect is minimal elevation in troponin is related to chronic diastolic heart failure from obesity and hypertension.  I would like to obtain a BMP and a BNP along with lipid profile testing in 1 month. I will see him back in 6 months unless recurrence of symptoms and or markedly abnormal labs. He will make an appointment with Dr. Scarlette Calico for hypertension f/u.  40-minute encounter with the evaluation of hospital records and labs and management of his chronic conditions.  Adrian Prows, MD, [facility] 05/31/2019, 10:13 PM [facility]. [place] Office: [phone]

## 2019-05-30 NOTE — Patient Instructions (Addendum)
You will finish Telmisartan that you have at home.  Start Benicar HCT in the morning and after 2 weeks if BP >130/80 mm Hg, Increase Amlodipine to 5 mg and call us.  Blood work in 1 month

## 2019-05-31 ENCOUNTER — Other Ambulatory Visit: Payer: Self-pay | Admitting: Cardiology

## 2019-05-31 DIAGNOSIS — I10 Essential (primary) hypertension: Secondary | ICD-10-CM

## 2019-06-02 ENCOUNTER — Other Ambulatory Visit: Payer: Self-pay

## 2019-06-02 DIAGNOSIS — I10 Essential (primary) hypertension: Secondary | ICD-10-CM

## 2019-06-03 DIAGNOSIS — M25552 Pain in left hip: Secondary | ICD-10-CM | POA: Diagnosis not present

## 2019-06-03 DIAGNOSIS — Z96642 Presence of left artificial hip joint: Secondary | ICD-10-CM | POA: Diagnosis not present

## 2019-06-11 DIAGNOSIS — N5201 Erectile dysfunction due to arterial insufficiency: Secondary | ICD-10-CM | POA: Diagnosis not present

## 2019-06-11 DIAGNOSIS — E291 Testicular hypofunction: Secondary | ICD-10-CM | POA: Diagnosis not present

## 2019-06-16 ENCOUNTER — Other Ambulatory Visit: Payer: Self-pay

## 2019-06-16 ENCOUNTER — Ambulatory Visit (INDEPENDENT_AMBULATORY_CARE_PROVIDER_SITE_OTHER): Payer: Medicare Other | Admitting: Physician Assistant

## 2019-06-16 ENCOUNTER — Encounter: Payer: Self-pay | Admitting: Physician Assistant

## 2019-06-16 VITALS — BP 128/85 | HR 65 | Resp 18 | Ht 71.0 in | Wt 276.6 lb

## 2019-06-16 DIAGNOSIS — Z8679 Personal history of other diseases of the circulatory system: Secondary | ICD-10-CM

## 2019-06-16 DIAGNOSIS — M1990 Unspecified osteoarthritis, unspecified site: Secondary | ICD-10-CM

## 2019-06-16 DIAGNOSIS — M1380 Other specified arthritis, unspecified site: Secondary | ICD-10-CM

## 2019-06-16 DIAGNOSIS — M51369 Other intervertebral disc degeneration, lumbar region without mention of lumbar back pain or lower extremity pain: Secondary | ICD-10-CM

## 2019-06-16 DIAGNOSIS — Z8669 Personal history of other diseases of the nervous system and sense organs: Secondary | ICD-10-CM

## 2019-06-16 DIAGNOSIS — M19041 Primary osteoarthritis, right hand: Secondary | ICD-10-CM | POA: Diagnosis not present

## 2019-06-16 DIAGNOSIS — M5030 Other cervical disc degeneration, unspecified cervical region: Secondary | ICD-10-CM | POA: Diagnosis not present

## 2019-06-16 DIAGNOSIS — Z8719 Personal history of other diseases of the digestive system: Secondary | ICD-10-CM

## 2019-06-16 DIAGNOSIS — Z5181 Encounter for therapeutic drug level monitoring: Secondary | ICD-10-CM | POA: Diagnosis not present

## 2019-06-16 DIAGNOSIS — M2141 Flat foot [pes planus] (acquired), right foot: Secondary | ICD-10-CM

## 2019-06-16 DIAGNOSIS — Z9889 Other specified postprocedural states: Secondary | ICD-10-CM | POA: Diagnosis not present

## 2019-06-16 DIAGNOSIS — Z8639 Personal history of other endocrine, nutritional and metabolic disease: Secondary | ICD-10-CM | POA: Diagnosis not present

## 2019-06-16 DIAGNOSIS — M1A09X0 Idiopathic chronic gout, multiple sites, without tophus (tophi): Secondary | ICD-10-CM | POA: Diagnosis not present

## 2019-06-16 DIAGNOSIS — Z96643 Presence of artificial hip joint, bilateral: Secondary | ICD-10-CM

## 2019-06-16 DIAGNOSIS — M5136 Other intervertebral disc degeneration, lumbar region: Secondary | ICD-10-CM | POA: Diagnosis not present

## 2019-06-16 DIAGNOSIS — M2142 Flat foot [pes planus] (acquired), left foot: Secondary | ICD-10-CM

## 2019-06-16 DIAGNOSIS — R5383 Other fatigue: Secondary | ICD-10-CM

## 2019-06-16 DIAGNOSIS — R7989 Other specified abnormal findings of blood chemistry: Secondary | ICD-10-CM

## 2019-06-16 DIAGNOSIS — M19042 Primary osteoarthritis, left hand: Secondary | ICD-10-CM

## 2019-06-16 NOTE — Patient Instructions (Signed)
Hydroxychloroquine tablets What is this medicine? HYDROXYCHLOROQUINE (hye drox ee KLOR oh kwin) is used to treat rheumatoid arthritis and systemic lupus erythematosus. It is also used to treat malaria. This medicine may be used for other purposes; ask your health care provider or pharmacist if you have questions. COMMON BRAND NAME(S): Plaquenil, Quineprox What should I tell my health care provider before I take this medicine? They need to know if you have any of these conditions:  diabetes  eye disease, vision problems  G6PD deficiency  heart disease  history of irregular heartbeat  if you often drink alcohol  kidney disease  liver disease  porphyria  psoriasis  an unusual or allergic reaction to chloroquine, hydroxychloroquine, other medicines, foods, dyes, or preservatives  pregnant or trying to get pregnant  breast-feeding How should I use this medicine? Take this medicine by mouth with a glass of water. Follow the directions on the prescription label. Do not cut, crush or chew this medicine. Swallow the tablets whole. Take this medicine with food. Avoid taking antacids within 4 hours of taking this medicine. It is best to separate these medicines by at least 4 hours. Take your medicine at regular intervals. Do not take it more often than directed. Take all of your medicine as directed even if you think you are better. Do not skip doses or stop your medicine early. Talk to your pediatrician regarding the use of this medicine in children. While this drug may be prescribed for selected conditions, precautions do apply. Overdosage: If you think you have taken too much of this medicine contact a poison control center or emergency room at once. NOTE: This medicine is only for you. Do not share this medicine with others. What if I miss a dose? If you miss a dose, take it as soon as you can. If it is almost time for your next dose, take only that dose. Do not take double or extra  doses. What may interact with this medicine? Do not take this medicine with any of the following medications:  cisapride  dronedarone  pimozide  thioridazine This medicine may also interact with the following medications:  ampicillin  antacids  cimetidine  cyclosporine  digoxin  kaolin  medicines for diabetes, like insulin, glipizide, glyburide  medicines for seizures like carbamazepine, phenobarbital, phenytoin  mefloquine  methotrexate  other medicines that prolong the QT interval (cause an abnormal heart rhythm)  praziquantel This list may not describe all possible interactions. Give your health care provider a list of all the medicines, herbs, non-prescription drugs, or dietary supplements you use. Also tell them if you smoke, drink alcohol, or use illegal drugs. Some items may interact with your medicine. What should I watch for while using this medicine? Visit your health care professional for regular checks on your progress. Tell your health care professional if your symptoms do not start to get better or if they get worse. You may need blood work done while you are taking this medicine. If you take other medicines that can affect heart rhythm, you may need more testing. Talk to your health care professional if you have questions. Your vision may be tested before and during use of this medicine. Tell your health care professional right away if you have any change in your eyesight. What side effects may I notice from receiving this medicine? Side effects that you should report to your doctor or health care professional as soon as possible:  allergic reactions like skin rash, itching or hives,   swelling of the face, lips, or tongue  changes in vision  decreased hearing or ringing of the ears  muscle weakness  redness, blistering, peeling or loosening of the skin, including inside the mouth  sensitivity to light  signs and symptoms of a dangerous change in  heartbeat or heart rhythm like chest pain; dizziness; fast or irregular heartbeat; palpitations; feeling faint or lightheaded, falls; breathing problems  signs and symptoms of liver injury like dark yellow or brown urine; general ill feeling or flu-like symptoms; light-colored stools; loss of appetite; nausea; right upper belly pain; unusually weak or tired; yellowing of the eyes or skin  signs and symptoms of low blood sugar such as feeling anxious; confusion; dizziness; increased hunger; unusually weak or tired; sweating; shakiness; cold; irritable; headache; blurred vision; fast heartbeat; loss of consciousness  suicidal thoughts  uncontrollable head, mouth, neck, arm, or leg movements Side effects that usually do not require medical attention (report to your doctor or health care professional if they continue or are bothersome):  diarrhea  dizziness  hair loss  headache  irritable  loss of appetite  nausea, vomiting  stomach pain This list may not describe all possible side effects. Call your doctor for medical advice about side effects. You may report side effects to FDA at 1-800-FDA-1088. Where should I keep my medicine? Keep out of the reach of children. Store at room temperature between 15 and 30 degrees C (59 and 86 degrees F). Protect from moisture and light. Throw away any unused medicine after the expiration date. NOTE: This sheet is a summary. It may not cover all possible information. If you have questions about this medicine, talk to your doctor, pharmacist, or health care provider.  2020 Elsevier/Gold Standard (2018-06-17 12:56:32)  

## 2019-06-16 NOTE — Progress Notes (Signed)
Office Visit Note  Patient: Jonathan Powers             Date of Birth: [birth year]           MRN: [id number]             PCP: [name], MD Referring: [name], MD Visit Date: 06/16/2019 Occupation: @GUAROCC @  Subjective:  Pain and swelling in both wrist joints   History of Present Illness: ALESANDER YADAV is a 69 y.o. male with history of inflammatory arthritis, gout, and osteoarthritis.  He has not had a gout flare in several years.  He is not taking any urate lowering medications at this time.  He presents today with increased pain and swelling in both wrist joints.  He experiences nocturnal pain in both wrists as well as morning stiffness lasting 1 to 2 hours daily.  He uses Voltaren gel topically as needed which provides temporary relief.  He takes tramadol very sparingly for pain relief as well.  He continues to have chronic pain in both shoulder joints and both knee joints.  He had a left total hip revision performed by Dr. Mayer Camel on 03/07/2019, which is doing better but he has very restricted ROM.   Activities of Daily Living:  Patient reports morning stiffness for  1-2 hours.   Patient Reports nocturnal pain.  Difficulty dressing/grooming: Denies Difficulty climbing stairs: Reports Difficulty getting out of chair: Reports Difficulty using hands for taps, buttons, cutlery, and/or writing: Reports  Review of Systems  Constitutional: Positive for fatigue. Negative for night sweats.  HENT: Negative for mouth sores, mouth dryness and nose dryness.   Eyes: Negative for redness and dryness.  Respiratory: Negative for shortness of breath and difficulty breathing.   Cardiovascular: Negative for chest pain, palpitations, hypertension, irregular heartbeat and swelling in legs/feet.  Gastrointestinal: Negative for constipation and diarrhea.  Endocrine: Negative for increased urination.  Genitourinary: Negative for painful urination.  Musculoskeletal: Positive for  arthralgias, joint pain, joint swelling and morning stiffness. Negative for myalgias, muscle weakness, muscle tenderness and myalgias.  Skin: Negative for color change, rash, hair loss, nodules/bumps, skin tightness, ulcers and sensitivity to sunlight.  Allergic/Immunologic: Negative for susceptible to infections.  Neurological: Negative for dizziness, fainting, memory loss, night sweats and weakness.  Hematological: Negative for swollen glands.  Psychiatric/Behavioral: Negative for depressed mood and sleep disturbance. The patient is not nervous/anxious.     PMFS History:  Patient Active Problem List   Diagnosis Date Noted  . S/P revision of total hip 03/07/2019  . Failed total hip arthroplasty ([place]) 03/06/2019  . Intestinal infection due to Clostridioides difficile 11/28/2018  . Diarrhea of presumed infectious origin 11/26/2018  . Recurrent genital HSV (herpes simplex virus) infection 11/12/2018  . HLD (hyperlipidemia) 11/11/2018  . Other specified hypothyroidism 11/11/2018  . Cellulitis of left lower extremity 11/10/2018  . Insomnia 11/10/2018  . GERD (gastroesophageal reflux disease) 11/10/2018  . Seasonal allergies 11/10/2018  . Recurrent oral herpes simplex 09/23/2018  . Age-related osteoporosis with current pathological fracture 03/11/2018  . Flexural eczema 07/24/2017  . Routine general medical examination at a health care facility 10/12/2016  . Chronic right-sided low back pain without sciatica 07/21/2016  . Low testosterone 07/21/2016  . Idiopathic chronic gout of multiple sites without tophus 07/21/2016  . Superior mesenteric artery aneurysm ([place]) 07/12/2015  . Left thyroid nodule   . OSA (obstructive sleep apnea) 01/27/2013  . Hypothyroid   . Symptomatic PVCs 09/06/2012  . Dyslipidemia, goal LDL  below 130 09/06/2012  . BPH (benign prostatic hyperplasia) 09/06/2012  . OBESITY NOS 05/17/2006  . Essential hypertension 05/17/2006  . ALLERGIC RHINITIS 05/17/2006  .  OSTEOARTHRITIS 05/17/2006    Past Medical History:  Diagnosis Date  . ALLERGIC RHINITIS   . Cancer ([place])    Melonoma back  . CELLULITIS, LEG, RIGHT    Recurrent R hip cellulitis 1/08,3/08   . Diverticulitis   . GERD (gastroesophageal reflux disease)   . Gout   . Hashimoto's thyroiditis   . Hypertension   . Hypogonadism male    low T, a/w ED  . Hypothyroid    goiters   . Left thyroid nodule 2008   on [place], decrease size in 2011 [place]  . Migraines    Atypical and ocular  . OSTEOARTHRITIS   . Seasonal allergies   . Sleep apnea    cpap    Family History  Problem Relation Age of Onset  . Arthritis Father   . Heart disease Father   . Diabetes Father   . Vascular Disease Father        AoBifem  . Arthritis Mother   . Diabetes Mother   . Multiple sclerosis Mother   . Cancer Paternal Grandmother        "GI"  . Cancer Paternal Grandfather        stomach cancer  . Thyroid cancer Sister   . Uterine cancer Sister    Past Surgical History:  Procedure Laterality Date  . CARPAL TUNNEL RELEASE Left 03/30/2017   Procedure: CARPAL TUNNEL RELEASE;  Surgeon: Garald Balding, MD;  Location: [facility];  Service: Orthopedics;  Laterality: Left;  . CHOLECYSTECTOMY  2004  . CTR Bilateral 1982  . CYSTOSCOPY  1974  . FOOT FUSION Left 06/22/2017  . HEMORRHOID SURGERY N/A 03/30/2017   Procedure: HEMORRHOIDECTOMY;  Surgeon: Coralie Keens, MD;  Location: [facility];  Service: General;  Laterality: N/A;  . open reduction L little finger Left 1970  . Repair digital thumb, left Left 1990  . Right shoulder cuff repair Right 09/15/11  . Right shoulder SAD, DCR Right 02/05/11  . TOTAL HIP ARTHROPLASTY Left 07/26/06  . TOTAL HIP ARTHROPLASTY Right 1999  . TOTAL HIP REVISION Left 03/07/2019   Procedure: LEFT TOTAL HIP REVISION POSTERIOR APPROACH;  Surgeon: Frederik Pear, MD;  Location: [facility];  Service: Orthopedics;  Laterality: Left;   Social History   Social History  Narrative   ortho PA, married, lives with spouse   Immunization History  Administered Date(s) Administered  . Influenza Split 12/21/2013  . Influenza, High Dose Seasonal PF 11/20/2017, 11/21/2018  . Influenza,inj,Quad PF,6+ Mos 12/11/2012, 11/20/2016  . Influenza-Unspecified 11/21/2014, 11/19/2015  . Pneumococcal Conjugate-13 02/19/2017  . Pneumococcal Polysaccharide-23 07/12/2015  . Tdap 07/12/2015     Objective: Vital Signs: BP 128/85 (BP Location: Left Arm, Patient Position: Sitting, Cuff Size: Normal)   Pulse 65   Resp 18   Ht 5\' 11"  (1.803 m)   Wt 276 lb 9.6 oz (125.5 kg)   BMI 38.58 kg/m    Physical Exam Vitals and nursing note reviewed.  Constitutional:      Appearance: He is well-developed.  HENT:     Head: Normocephalic and atraumatic.  Eyes:     Conjunctiva/sclera: Conjunctivae normal.     Pupils: Pupils are equal, round, and reactive to light.  Pulmonary:     Effort: Pulmonary effort is normal.  Abdominal:     General: Bowel sounds are  normal.     Palpations: Abdomen is soft.  Musculoskeletal:     Cervical back: Normal range of motion and neck supple.  Skin:    General: Skin is warm and dry.     Capillary Refill: Capillary refill takes less than 2 seconds.  Neurological:     Mental Status: He is alert and oriented to person, place, and time.  Psychiatric:        Behavior: Behavior normal.      Musculoskeletal Exam: C-spine slightly limited ROM with lateral rotation.  Postural thoracic kyphosis noted. Limited ROM of lumbar spine.  Painful ROM of both shoulder joints. Elbows have good ROM with no tenderness or synovitis.  Tenderness and swelling over both wrist joints noted.  Mild PIP and DIP thickening consistent with osteoarthritis of both hands.  No tenderness or synovitis of MCP joints.  Bilateral hip joints are replaced.  Knee joints have good ROM with no warmth or effusion.    CDAI Exam: CDAI Score: -- Patient Global: --; Provider Global:  -- Swollen: --; Tender: -- Joint Exam 06/16/2019   No joint exam has been documented for this visit   There is currently no information documented on the homunculus. Go to the Rheumatology activity and complete the homunculus joint exam.  Investigation: No additional findings.  Imaging: No results found.  Recent Labs: Lab Results  Component Value Date   WBC 7.6 05/15/2019   HGB 14.9 05/15/2019   PLT 259 05/15/2019   NA 142 05/15/2019   K 4.2 05/15/2019   CL 105 05/15/2019   CO2 28 05/15/2019   GLUCOSE 105 (H) 05/15/2019   BUN 9 05/15/2019   CREATININE 1.29 (H) 05/15/2019   BILITOT 0.6 11/19/2018   ALKPHOS 53 11/09/2018   AST 25 11/19/2018   ALT 25 11/19/2018   PROT 6.9 11/19/2018   ALBUMIN 4.0 11/09/2018   CALCIUM 9.2 05/15/2019   GFRAA >60 05/15/2019    Speciality Comments: No specialty comments available.  Procedures:  No procedures performed Allergies: Nsaids, Tolmetin, and Penicillins   Assessment / Plan:     Visit Diagnoses: Inflammatory arthritis: He presents today with tenderness and inflammation of bilateral wrist joints.  He has been experiencing constant pain in both wrists and has had worsening nocturnal pain.  He has limited range of motion with discomfort of both wrist joints on exam today.  He has been applying Voltaren gel topically as needed for pain relief but provides temporary relief.  In the past we discussed starting him on a trial of Plaquenil but he declined at that time.  Different treatment options were discussed today and he would like to proceed with starting on Plaquenil.  Indications, contraindications, potential side effects of Plaquenil were discussed.  All questions were addressed and consent was obtained.  He would like to discuss starting on Plaquenil with Dr. Einar Gip his cardiologist and Dr. Katy Fitch his ophthalmologist prior to having the prescription sent to the pharmacy.  He will notify us once he has been cleared and feels comfortable  proceeding.  He will follow-up in the office in 6 weeks.  Idiopathic chronic gout of multiple sites without tophus: He has not had a gout flare in several years.  He is not taking a urate lowering medication at this time.  His uric acid was 8.1 on 07/09/2018.  Primary osteoarthritis of both hands: He has PIP and DIP thickening consistent with osteoarthritis of both hands.  No tenderness or synovitis was noted.  He has complete fist  formation bilaterally.  Joint protection and muscle strengthening were discussed.  Pes planus of both feet: He wears supportive shoes.   S/P right rotator cuff repair: He has chronic right shoulder joint pain.  He has good range of motion with discomfort on exam.  Status post bilateral total hip replacement: Chronic pain.  He had a left total hip revision performed on 03/07/2019 by Dr. Mayer Camel.  His discomfort has improved but he has very restricted range of motion.  DDD (degenerative disc disease), cervical: He has slightly limited range of motion with lateral rotation of his C-spine.  He has no symptoms of radiculopathy at this time.  DDD (degenerative disc disease), lumbar: Chronic pain.  He takes tramadol 50 mg 1 tablet by mouth at bedtime as needed for pain relief.  He is not experiencing any symptoms of radiculopathy currently.  Other medical conditions are listed as follows:  History of hypertension  History of hypothyroidism Hashimotos  History of sleep apnea  History of gastroesophageal reflux (GERD)  History of diverticulitis  History of vitamin D deficiency  Other fatigue  Low testosterone  Orders: No orders of the defined types were placed in this encounter.  No orders of the defined types were placed in this encounter.     Follow-Up Instructions: Return in about 6 weeks (around 07/28/2019) for Inflammatory arthritis .   Ofilia Neas, PA-C  Note - This record has been created using Dragon software.  Chart creation errors have been  sought, but may not always  have been located. Such creation errors do not reflect on  the standard of medical care.

## 2019-06-18 LAB — PAIN MGMT, PROFILE 5 W/CONF, U
Amphetamines: NEGATIVE ng/mL
Barbiturates: NEGATIVE ng/mL
Benzodiazepines: NEGATIVE ng/mL
Cocaine Metabolite: NEGATIVE ng/mL
Codeine: NEGATIVE ng/mL
Creatinine: 190.8 mg/dL
Hydrocodone: NEGATIVE ng/mL
Hydromorphone: NEGATIVE ng/mL
Marijuana Metabolite: NEGATIVE ng/mL
Methadone Metabolite: NEGATIVE ng/mL
Morphine: NEGATIVE ng/mL
Norhydrocodone: NEGATIVE ng/mL
Opiates: NEGATIVE ng/mL
Oxidant: NEGATIVE ug/mL
Oxycodone: NEGATIVE ng/mL
pH: 6 (ref 4.5–9.0)

## 2019-06-18 LAB — PAIN MGMT, TRAMADOL W/MEDMATCH, U
Desmethyltramadol: 2132 ng/mL
Tramadol: 3775 ng/mL

## 2019-06-18 NOTE — Progress Notes (Signed)
UDS is consistent with treatment.

## 2019-06-19 ENCOUNTER — Other Ambulatory Visit: Payer: Self-pay

## 2019-06-19 MED ORDER — TRAMADOL HCL 50 MG PO TABS: ORAL_TABLET | ORAL | 0 refills | Status: DC

## 2019-06-19 NOTE — Telephone Encounter (Signed)
Last Visit: 05/27/2019 Next Visit: patient will schedule.  UDS: 06/16/2019 c/w Narc Agreement: 06/16/2019  Last fill: 03/03/2019   Okay to refill tramadol?

## 2019-06-25 ENCOUNTER — Other Ambulatory Visit: Payer: Self-pay | Admitting: Internal Medicine

## 2019-06-25 DIAGNOSIS — E039 Hypothyroidism, unspecified: Secondary | ICD-10-CM

## 2019-07-01 ENCOUNTER — Other Ambulatory Visit: Payer: Self-pay | Admitting: Orthopedic Surgery

## 2019-07-01 DIAGNOSIS — M25552 Pain in left hip: Secondary | ICD-10-CM

## 2019-07-14 ENCOUNTER — Encounter: Payer: Self-pay | Admitting: Internal Medicine

## 2019-07-16 ENCOUNTER — Other Ambulatory Visit: Payer: Self-pay

## 2019-07-16 ENCOUNTER — Encounter: Payer: Self-pay | Admitting: Internal Medicine

## 2019-07-16 ENCOUNTER — Ambulatory Visit (INDEPENDENT_AMBULATORY_CARE_PROVIDER_SITE_OTHER): Payer: Medicare Other | Admitting: Internal Medicine

## 2019-07-16 ENCOUNTER — Ambulatory Visit
Admission: RE | Admit: 2019-07-16 | Discharge: 2019-07-16 | Disposition: A | Discharge status: Home / Self Care | Payer: Medicare Other | Source: Ambulatory Visit | Attending: Orthopedic Surgery | Admitting: Orthopedic Surgery

## 2019-07-16 VITALS — BP 130/70 | HR 79 | Temp 98.7°F | Resp 16 | Ht 71.0 in | Wt 280.0 lb

## 2019-07-16 DIAGNOSIS — E039 Hypothyroidism, unspecified: Secondary | ICD-10-CM

## 2019-07-16 DIAGNOSIS — M25552 Pain in left hip: Secondary | ICD-10-CM | POA: Diagnosis not present

## 2019-07-16 DIAGNOSIS — I10 Essential (primary) hypertension: Secondary | ICD-10-CM | POA: Diagnosis not present

## 2019-07-16 DIAGNOSIS — R10817 Generalized abdominal tenderness: Secondary | ICD-10-CM

## 2019-07-16 LAB — CBC WITH DIFFERENTIAL/PLATELET
Basophils Absolute: 0 10*3/uL (ref 0.0–0.1)
Basophils Relative: 0.5 % (ref 0.0–3.0)
Eosinophils Absolute: 0.3 10*3/uL (ref 0.0–0.7)
Eosinophils Relative: 3.6 % (ref 0.0–5.0)
HCT: 42.8 % (ref 39.0–52.0)
Hemoglobin: 14.9 g/dL (ref 13.0–17.0)
Lymphocytes Relative: 28.8 % (ref 12.0–46.0)
Lymphs Abs: 2.1 10*3/uL (ref 0.7–4.0)
MCHC: 34.8 g/dL (ref 30.0–36.0)
MCV: 85.5 fl (ref 78.0–100.0)
Monocytes Absolute: 0.7 10*3/uL (ref 0.1–1.0)
Monocytes Relative: 10 % (ref 3.0–12.0)
Neutro Abs: 4.2 10*3/uL (ref 1.4–7.7)
Neutrophils Relative %: 57.1 % (ref 43.0–77.0)
Platelets: 282 10*3/uL (ref 150.0–400.0)
RBC: 5.01 Mil/uL (ref 4.22–5.81)
RDW: 14.2 % (ref 11.5–15.5)
WBC: 7.4 10*3/uL (ref 4.0–10.5)

## 2019-07-16 LAB — BASIC METABOLIC PANEL
BUN: 13 mg/dL (ref 6–23)
CO2: 31 mEq/L (ref 19–32)
Calcium: 9.4 mg/dL (ref 8.4–10.5)
Chloride: 102 mEq/L (ref 96–112)
Creatinine, Ser: 0.94 mg/dL (ref 0.40–1.50)
GFR: 79.87 mL/min (ref >60.00)
Glucose, Bld: 102 mg/dL — ABNORMAL HIGH (ref 70–99)
Potassium: 3.7 mEq/L (ref 3.5–5.1)
Sodium: 137 mEq/L (ref 135–145)

## 2019-07-16 LAB — HEPATIC FUNCTION PANEL
ALT: 18 U/L (ref 0–53)
AST: 22 U/L (ref 0–37)
Albumin: 4.4 g/dL (ref 3.5–5.2)
Alkaline Phosphatase: 55 U/L (ref 39–117)
Bilirubin, Direct: 0.1 mg/dL (ref 0.0–0.3)
Total Bilirubin: 0.5 mg/dL (ref 0.2–1.2)
Total Protein: 6.9 g/dL (ref 6.0–8.3)

## 2019-07-16 LAB — AMYLASE: Amylase: 35 U/L (ref 27–131)

## 2019-07-16 LAB — LIPASE: Lipase: 24 U/L (ref 11.0–59.0)

## 2019-07-16 LAB — TSH: TSH: 1.26 u[IU]/mL (ref 0.35–4.50)

## 2019-07-16 NOTE — Progress Notes (Signed)
Subjective:  Patient ID: Jonathan Powers, male    DOB: 06-24-1950  Age: 69 y.o. MRN: [id number]  CC: Abdominal Pain and Hypothyroidism  This visit occurred during the SARS-CoV-2 public health emergency.  Safety protocols were in place, including screening questions prior to the visit, additional usage of staff PPE, and extensive cleaning of exam room while observing appropriate contact time as indicated for disinfecting solutions.    HPI Jonathan Powers presents for a 53-month history of abdominal pain.  He describes it as a dull achy sensation with intermittent cramping, diarrhea, and constipation.  The pain radiates into his back.  He thinks he has a family history of pancreatic cancer and that is what he is concerned about.  He has had some black stools.  He has not gotten much symptom relief with Maalox, Tylenol, or Tums.  He has a history of irritable bowel syndrome and is status post cholecystectomy.  He also reports a remote history of peptic ulcer disease.  He is not currently taking NSAIDs, does not drink alcohol, and does not smoke cigarettes.  Outpatient Medications Prior to Visit  Medication Sig Dispense Refill  . acetaminophen (TYLENOL) 500 MG tablet Take 1,000 mg by mouth every 6 (six) hours as needed for moderate pain.     [name] amLODipine (NORVASC) 2.5 MG tablet TAKE ONE TABLET BY MOUTH ONE TIME DAILY  90 tablet 2  . cetirizine (ZYRTEC) 10 MG tablet Take 10 mg by mouth daily.    . diclofenac sodium (VOLTAREN) 1 % GEL 2-4 grams qid as needed for pain as discussed in office (Patient taking differently: Apply 2 g topically 4 (four) times daily as needed (pain). ) 10 Tube 12  . levothyroxine (SYNTHROID) 175 MCG tablet Take 1 tablet (175 mcg total) by mouth daily before breakfast. 90 tablet 1  . olmesartan-hydrochlorothiazide (BENICAR HCT) 40-25 MG tablet Take 1 tablet by mouth daily. 30 tablet 3  . rosuvastatin (CRESTOR) 5 MG tablet Take 1 tablet (5 mg total) by mouth at bedtime. 90  tablet 1  . tadalafil (CIALIS) 5 MG tablet Take 5 mg by mouth at bedtime.    . Testosterone 1.62 % GEL Apply 3 Pump topically daily.    . traMADol (ULTRAM) 50 MG tablet Take 1 tablet (50 mg total) by mouth at bedtime as needed. 90 tablet 0  . traZODone (DESYREL) 100 MG tablet TAKE ONE HALF TABLET TO ONE TABLET BY MOUTH AT BEDTIME AS NEEDED FOR SLEEP (Patient taking differently: Take 50 mg by mouth at bedtime. ) 90 tablet 0  . valACYclovir (VALTREX) 1000 MG tablet TAKE ONE TABLET BY MOUTH TWICE DAILY  (Patient not taking: No sig reported) 14 tablet 2   No facility-administered medications prior to visit.    ROS Review of Systems  Constitutional: Negative.  Negative for appetite change, diaphoresis, fatigue and unexpected weight change.  HENT: Negative.  Negative for trouble swallowing.   Eyes: Negative.   Respiratory: Negative for cough, chest tightness, shortness of breath and wheezing.   Cardiovascular: Negative for chest pain, palpitations and leg swelling.  Gastrointestinal: Positive for abdominal pain, constipation and diarrhea. Negative for abdominal distention, blood in stool, nausea, rectal pain and vomiting.  Endocrine: Negative.   Genitourinary: Negative.  Negative for difficulty urinating, dysuria, hematuria and urgency.  Musculoskeletal: Negative.  Negative for arthralgias and back pain.  Skin: Negative.  Negative for color change and pallor.  Neurological: Negative.  Negative for dizziness, weakness and light-headedness.  Hematological: Negative for adenopathy.  Does not bruise/bleed easily.  Psychiatric/Behavioral: Negative.     Objective:  BP 130/70 (BP Location: Left Arm, Patient Position: Sitting, Cuff Size: Large)   Pulse 79   Temp 98.7 F (37.1 C) (Oral)   Resp 16   Ht 5\' 11"  (1.803 m)   Wt 280 lb (127 kg)   SpO2 97%   BMI 39.05 kg/m   BP Readings from Last 3 Encounters:  07/16/19 130/70  06/16/19 128/85  05/30/19 (!) 152/90    Wt Readings from Last 3  Encounters:  07/16/19 280 lb (127 kg)  06/16/19 276 lb 9.6 oz (125.5 kg)  05/30/19 280 lb (127 kg)    Physical Exam Vitals reviewed.  Constitutional:      Appearance: He is well-developed.  HENT:     Mouth/Throat:     Mouth: Mucous membranes are moist.     Pharynx: No oropharyngeal exudate.  Eyes:     General: No scleral icterus.    Conjunctiva/sclera: Conjunctivae normal.  Cardiovascular:     Rate and Rhythm: Normal rate and regular rhythm.     Heart sounds: No murmur.  Pulmonary:     Effort: Pulmonary effort is normal.     Breath sounds: No stridor. No wheezing, rhonchi or rales.  Abdominal:     General: Abdomen is protuberant. Bowel sounds are normal. There is no distension.     Palpations: There is no hepatomegaly, splenomegaly or mass.     Tenderness: There is abdominal tenderness in the periumbilical area. There is no guarding or rebound.     Hernia: No hernia is present.  Genitourinary:    Prostate: Normal. Not enlarged, not tender and no nodules present.     Rectum: Normal. Guaiac result negative. No mass, tenderness, anal fissure, external hemorrhoid or internal hemorrhoid. Normal anal tone.  Musculoskeletal:        General: Normal range of motion.     Cervical back: Neck supple.     Right lower leg: No edema.     Left lower leg: No edema.  Lymphadenopathy:     Cervical: No cervical adenopathy.  Skin:    General: Skin is warm and dry.     Coloration: Skin is not pale.  Neurological:     General: No focal deficit present.     Mental Status: He is alert.  Psychiatric:        Behavior: Behavior normal.     Lab Results  Component Value Date   WBC 7.4 07/16/2019   HGB 14.9 07/16/2019   HCT 42.8 07/16/2019   PLT 282.0 07/16/2019   GLUCOSE 102 (H) 07/16/2019   CHOL 108 11/19/2018   TRIG 235 (H) 11/19/2018   HDL 39 (L) 11/19/2018   LDLCALC 40 11/19/2018   ALT 18 07/16/2019   AST 22 07/16/2019   NA 137 07/16/2019   K 3.7 07/16/2019   CL 102 07/16/2019     CREATININE 0.94 07/16/2019   BUN 13 07/16/2019   CO2 31 07/16/2019   TSH 1.26 07/16/2019   PSA 0.5 11/19/2018   INR 1.0 03/05/2019   HGBA1C 5.1 11/09/2018    No results found.  Assessment & Plan:   Jonathan Powers was seen today for abdominal pain and hypothyroidism.  Diagnoses and all orders for this visit:  Essential hypertension- His blood pressure is adequately well controlled.  Electrolytes and renal function are normal. -     CBC with Differential/Platelet; Future -     Urinalysis, Routine w reflex microscopic; Future -  Basic metabolic panel; Future -     Basic metabolic panel -     Urinalysis, Routine w reflex microscopic -     CBC with Differential/Platelet  Acquired hypothyroidism- His TSH is in the normal range.  He will remain on the current dose of levothyroxine. -     TSH; Future -     TSH  Generalized abdominal tenderness without rebound tenderness- He has abdominal tenderness.  There is no blood in the stool and is not anemic so I do not think this is peptic ulcer disease.  The rest of his labs are all reassuring.  I have asked him to undergo a CT scan of the abdomen with contrast to screen for pathology like pancreatic cancer.  He recently had a CT scan of the hip which raised some concern for atherosclerosis.  The CT scan will also help evaluate for mesenteric atherosclerosis with ischemia. -     Lipase; Future -     Amylase; Future -     CBC with Differential/Platelet; Future -     Urinalysis, Routine w reflex microscopic; Future -     Hepatic function panel; Future -     Hepatic function panel -     Urinalysis, Routine w reflex microscopic -     CBC with Differential/Platelet -     Amylase -     Lipase -     CT Abdomen Pelvis W Contrast; Future   I am having Jonathan Powers maintain his cetirizine, acetaminophen, diclofenac sodium, valACYclovir, levothyroxine, traZODone, rosuvastatin, tadalafil, Testosterone, olmesartan-hydrochlorothiazide, amLODipine, and  traMADol.  No orders of the defined types were placed in this encounter.  I spent 50 minutes in preparing to see the patient by review of recent labs, imaging and procedures, obtaining and reviewing separately obtained history, communicating with the patient and family or caregiver, ordering medications, tests or procedures, and documenting clinical information in the EHR including the differential Dx, treatment, and any further evaluation and other management of 1. Essential hypertension 2. Acquired hypothyroidism 3. Generalized abdominal tenderness without rebound tenderness    Follow-up: Return in about 3 weeks (around 08/06/2019).  Scarlette Calico, MD

## 2019-07-16 NOTE — Patient Instructions (Signed)
Abdominal Pain, Adult Pain in the abdomen (abdominal pain) can be caused by many things. Often, abdominal pain is not serious and it gets better with no treatment or by being treated at home. However, sometimes abdominal pain is serious. Your health care provider will ask questions about your medical history and do a physical exam to try to determine the cause of your abdominal pain. Follow these instructions at home:  Medicines  Take over-the-counter and prescription medicines only as told by your health care provider.  Do not take a laxative unless told by your health care provider. General instructions  Watch your condition for any changes.  Drink enough fluid to keep your urine pale yellow.  Keep all follow-up visits as told by your health care provider. This is important. Contact a health care provider if:  Your abdominal pain changes or gets worse.  You are not hungry or you lose weight without trying.  You are constipated or have diarrhea for more than 2-3 days.  You have pain when you urinate or have a bowel movement.  Your abdominal pain wakes you up at night.  Your pain gets worse with meals, after eating, or with certain foods.  You are vomiting and cannot keep anything down.  You have a fever.  You have blood in your urine. Get help right away if:  Your pain does not go away as soon as your health care provider told you to expect.  You cannot stop vomiting.  Your pain is only in areas of the abdomen, such as the right side or the left lower portion of the abdomen. Pain on the right side could be caused by appendicitis.  You have bloody or black stools, or stools that look like tar.  You have severe pain, cramping, or bloating in your abdomen.  You have signs of dehydration, such as: ? Dark urine, very little urine, or no urine. ? Cracked lips. ? Dry mouth. ? Sunken eyes. ? Sleepiness. ? Weakness.  You have trouble breathing or chest  pain. Summary  Often, abdominal pain is not serious and it gets better with no treatment or by being treated at home. However, sometimes abdominal pain is serious.  Watch your condition for any changes.  Take over-the-counter and prescription medicines only as told by your health care provider.  Contact a health care provider if your abdominal pain changes or gets worse.  Get help right away if you have severe pain, cramping, or bloating in your abdomen. This information is not intended to replace advice given to you by your health care provider. Make sure you discuss any questions you have with your health care provider. Document Revised: 06/17/2018 Document Reviewed: 06/17/2018 Elsevier Patient Education  2020 [employer]  

## 2019-07-17 ENCOUNTER — Encounter: Payer: Self-pay | Admitting: Internal Medicine

## 2019-07-17 LAB — URINALYSIS, ROUTINE W REFLEX MICROSCOPIC
Bilirubin Urine: NEGATIVE
Hgb urine dipstick: NEGATIVE
Ketones, ur: NEGATIVE
Leukocytes,Ua: NEGATIVE
Nitrite: NEGATIVE
RBC / HPF: NONE SEEN (ref 0–?)
Specific Gravity, Urine: 1.025 (ref 1.000–1.030)
Total Protein, Urine: NEGATIVE
Urine Glucose: NEGATIVE
Urobilinogen, UA: 0.2 (ref 0.0–1.0)
pH: 5.5 (ref 5.0–8.0)

## 2019-08-04 ENCOUNTER — Ambulatory Visit
Admission: RE | Admit: 2019-08-04 | Discharge: 2019-08-04 | Disposition: A | Discharge status: Home / Self Care | Payer: Medicare Other | Source: Ambulatory Visit | Attending: Internal Medicine | Admitting: Internal Medicine

## 2019-08-04 DIAGNOSIS — K760 Fatty (change of) liver, not elsewhere classified: Secondary | ICD-10-CM | POA: Diagnosis not present

## 2019-08-04 DIAGNOSIS — R10817 Generalized abdominal tenderness: Secondary | ICD-10-CM

## 2019-08-04 MED ORDER — IOPAMIDOL (ISOVUE-300) INJECTION 61%
100.0000 mL | Freq: Once | INTRAVENOUS | Status: AC | PRN
  Administered 2019-08-04: 100 mL via INTRAVENOUS

## 2019-08-05 ENCOUNTER — Ambulatory Visit (INDEPENDENT_AMBULATORY_CARE_PROVIDER_SITE_OTHER): Payer: Medicare Other

## 2019-08-05 ENCOUNTER — Encounter (HOSPITAL_COMMUNITY): Payer: Self-pay

## 2019-08-05 ENCOUNTER — Ambulatory Visit (HOSPITAL_COMMUNITY)
Admission: EM | Admit: 2019-08-05 | Discharge: 2019-08-05 | Disposition: A | Discharge status: Home / Self Care | Payer: Medicare Other | Attending: Urgent Care | Admitting: Urgent Care

## 2019-08-05 ENCOUNTER — Other Ambulatory Visit: Payer: Self-pay

## 2019-08-05 DIAGNOSIS — R5381 Other malaise: Secondary | ICD-10-CM | POA: Diagnosis not present

## 2019-08-05 DIAGNOSIS — R1084 Generalized abdominal pain: Secondary | ICD-10-CM | POA: Insufficient documentation

## 2019-08-05 DIAGNOSIS — Z79899 Other long term (current) drug therapy: Secondary | ICD-10-CM | POA: Diagnosis not present

## 2019-08-05 DIAGNOSIS — G4730 Sleep apnea, unspecified: Secondary | ICD-10-CM | POA: Diagnosis not present

## 2019-08-05 DIAGNOSIS — I10 Essential (primary) hypertension: Secondary | ICD-10-CM | POA: Insufficient documentation

## 2019-08-05 DIAGNOSIS — Z8261 Family history of arthritis: Secondary | ICD-10-CM | POA: Insufficient documentation

## 2019-08-05 DIAGNOSIS — Z8719 Personal history of other diseases of the digestive system: Secondary | ICD-10-CM | POA: Insufficient documentation

## 2019-08-05 DIAGNOSIS — E063 Autoimmune thyroiditis: Secondary | ICD-10-CM | POA: Diagnosis not present

## 2019-08-05 DIAGNOSIS — R509 Fever, unspecified: Secondary | ICD-10-CM

## 2019-08-05 DIAGNOSIS — Z20822 Contact with and (suspected) exposure to covid-19: Secondary | ICD-10-CM | POA: Diagnosis not present

## 2019-08-05 DIAGNOSIS — Z886 Allergy status to analgesic agent status: Secondary | ICD-10-CM | POA: Insufficient documentation

## 2019-08-05 DIAGNOSIS — R109 Unspecified abdominal pain: Secondary | ICD-10-CM

## 2019-08-05 DIAGNOSIS — M109 Gout, unspecified: Secondary | ICD-10-CM | POA: Insufficient documentation

## 2019-08-05 DIAGNOSIS — G8929 Other chronic pain: Secondary | ICD-10-CM | POA: Diagnosis not present

## 2019-08-05 DIAGNOSIS — J9811 Atelectasis: Secondary | ICD-10-CM | POA: Diagnosis not present

## 2019-08-05 DIAGNOSIS — R0602 Shortness of breath: Secondary | ICD-10-CM

## 2019-08-05 DIAGNOSIS — K219 Gastro-esophageal reflux disease without esophagitis: Secondary | ICD-10-CM | POA: Insufficient documentation

## 2019-08-05 DIAGNOSIS — Z8249 Family history of ischemic heart disease and other diseases of the circulatory system: Secondary | ICD-10-CM | POA: Insufficient documentation

## 2019-08-05 DIAGNOSIS — M1990 Unspecified osteoarthritis, unspecified site: Secondary | ICD-10-CM | POA: Insufficient documentation

## 2019-08-05 DIAGNOSIS — Z96643 Presence of artificial hip joint, bilateral: Secondary | ICD-10-CM | POA: Diagnosis not present

## 2019-08-05 DIAGNOSIS — Z880 Allergy status to penicillin: Secondary | ICD-10-CM | POA: Diagnosis not present

## 2019-08-05 DIAGNOSIS — Z791 Long term (current) use of non-steroidal anti-inflammatories (NSAID): Secondary | ICD-10-CM | POA: Diagnosis not present

## 2019-08-05 LAB — POCT URINALYSIS DIP (DEVICE)
Bilirubin Urine: NEGATIVE
Glucose, UA: NEGATIVE mg/dL
Hgb urine dipstick: NEGATIVE
Ketones, ur: NEGATIVE mg/dL
Leukocytes,Ua: NEGATIVE
Nitrite: NEGATIVE
Protein, ur: NEGATIVE mg/dL
Specific Gravity, Urine: 1.02 (ref 1.005–1.030)
Urobilinogen, UA: 0.2 mg/dL (ref 0.0–1.0)
pH: 7 (ref 5.0–8.0)

## 2019-08-05 MED ORDER — ACETAMINOPHEN 325 MG PO TABS
ORAL_TABLET | ORAL | Status: AC
  Filled 2019-08-05: qty 2

## 2019-08-05 MED ORDER — ACETAMINOPHEN 325 MG PO TABS
650.0000 mg | ORAL_TABLET | Freq: Once | ORAL | Status: AC
  Administered 2019-08-05: 650 mg via ORAL

## 2019-08-05 MED ORDER — DOXYCYCLINE HYCLATE 100 MG PO CAPS
100.0000 mg | ORAL_CAPSULE | Freq: Two times a day (BID) | ORAL | 0 refills | Status: DC
Start: 2019-08-05 — End: 2019-12-22

## 2019-08-05 NOTE — ED Provider Notes (Signed)
Jonathan Powers  MRN: [id number] DOB: July 24, 1950  Subjective:   Jonathan Powers is a 69 y.o. male presenting for 2 to 64-month history of persistent generalized abdominal pain.  Has had intermittent fevers that developed today.  He has seen his PCP, had complete labs done 2 and half weeks ago.  All labs were normal.  He does have a history of bilateral hip replacement, left ankle surgery.  He still works as an Database administrator, helps with multiple procedures.  His symptoms of belly pain, body aches and occasional shortness of breath began again today.  He checked his temperature and had a fever up to 101.9.  He has been using Tylenol with some relief.  Has a history of superior mesenteric artery aneurysm, history of C. difficile.  Of note, patient did spend time out in [place], wooded and field areas.  No known specific tick bite.  Him and his daughter checked at home. Last COVID vaccination was 03/2019.   No current facility-administered medications for this encounter.  Current Outpatient Medications:  .  acetaminophen (TYLENOL) 500 MG tablet, Take 1,000 mg by mouth every 6 (six) hours as needed for moderate pain. , Disp: , Rfl:  .  amLODipine (NORVASC) 2.5 MG tablet, TAKE ONE TABLET BY MOUTH ONE TIME DAILY , Disp: 90 tablet, Rfl: 2 .  cetirizine (ZYRTEC) 10 MG tablet, Take 10 mg by mouth daily., Disp: , Rfl:  .  levothyroxine (SYNTHROID) 175 MCG tablet, Take 1 tablet (175 mcg total) by mouth daily before breakfast., Disp: 90 tablet, Rfl: 1 .  olmesartan-hydrochlorothiazide (BENICAR HCT) 40-25 MG tablet, Take 1 tablet by mouth daily., Disp: 30 tablet, Rfl: 3 .  rosuvastatin (CRESTOR) 5 MG tablet, Take 1 tablet (5 mg total) by mouth at bedtime., Disp: 90 tablet, Rfl: 1 .  tadalafil (CIALIS) 5 MG tablet, Take 5 mg by mouth at bedtime., Disp: , Rfl:  .  Testosterone 1.62 % GEL, Apply 3 Pump topically daily., Disp: , Rfl:  .  traMADol (ULTRAM) 50 MG tablet, Take 1 tablet (50 mg total) by mouth at  bedtime as needed., Disp: 90 tablet, Rfl: 0 .  diclofenac sodium (VOLTAREN) 1 % GEL, 2-4 grams qid as needed for pain as discussed in office (Patient taking differently: Apply 2 g topically 4 (four) times daily as needed (pain). ), Disp: 10 Tube, Rfl: 12 .  traZODone (DESYREL) 100 MG tablet, TAKE ONE HALF TABLET TO ONE TABLET BY MOUTH AT BEDTIME AS NEEDED FOR SLEEP (Patient taking differently: Take 50 mg by mouth at bedtime. ), Disp: 90 tablet, Rfl: 0 .  valACYclovir (VALTREX) 1000 MG tablet, TAKE ONE TABLET BY MOUTH TWICE DAILY  (Patient not taking: No sig reported), Disp: 14 tablet, Rfl: 2   Allergies  Allergen Reactions  . Nsaids Shortness Of Breath    Naproxen specifically causes SOB/wheeze tolerates topical diclofenac without problem Tylenol OK  . Tolmetin Shortness Of Breath    Naproxen specifically causes SOB/wheeze tolerates topical diclofenac without problem Tylenol OK  . Penicillins Rash    Did it involve swelling of the face/tongue/throat, SOB, or low BP? No Did it involve sudden or severe rash/hives, skin peeling, or any reaction on the inside of your mouth or nose? No Did you need to seek medical attention at a hospital or doctor's office? No When did it last happen? If all above answers are "NO", may proceed with cephalosporin use.     Past Medical History:  Diagnosis Date  . ALLERGIC RHINITIS   .  Cancer ([place])    Melonoma back  . CELLULITIS, LEG, RIGHT    Recurrent R hip cellulitis 1/08,3/08   . Diverticulitis   . GERD (gastroesophageal reflux disease)   . Gout   . Hashimoto's thyroiditis   . Hypertension   . Hypogonadism male    low T, a/w ED  . Hypothyroid    goiters   . Left thyroid nodule 2008   on [place], decrease size in 2011 [place]  . Migraines    Atypical and ocular  . OSTEOARTHRITIS   . Seasonal allergies   . Sleep apnea    cpap     Past Surgical History:  Procedure Laterality Date  . CARPAL TUNNEL RELEASE Left 03/30/2017   Procedure: CARPAL  TUNNEL RELEASE;  Surgeon: Garald Balding, MD;  Location: [facility];  Service: Orthopedics;  Laterality: Left;  . CHOLECYSTECTOMY  2004  . CTR Bilateral 1982  . CYSTOSCOPY  1974  . FOOT FUSION Left 06/22/2017  . HEMORRHOID SURGERY N/A 03/30/2017   Procedure: HEMORRHOIDECTOMY;  Surgeon: Coralie Keens, MD;  Location: [facility];  Service: General;  Laterality: N/A;  . open reduction L little finger Left 1970  . Repair digital thumb, left Left 1990  . Right shoulder cuff repair Right 09/15/11  . Right shoulder SAD, DCR Right 02/05/11  . TOTAL HIP ARTHROPLASTY Left 07/26/06  . TOTAL HIP ARTHROPLASTY Right 1999  . TOTAL HIP REVISION Left 03/07/2019   Procedure: LEFT TOTAL HIP REVISION POSTERIOR APPROACH;  Surgeon: Frederik Pear, MD;  Location: [facility];  Service: Orthopedics;  Laterality: Left;    Family History  Problem Relation Age of Onset  . Arthritis Father   . Heart disease Father   . Diabetes Father   . Vascular Disease Father        AoBifem  . Arthritis Mother   . Diabetes Mother   . Multiple sclerosis Mother   . Cancer Paternal Grandmother        "GI"  . Cancer Paternal Grandfather        stomach cancer  . Thyroid cancer Sister   . Uterine cancer Sister     Social History   Tobacco Use  . Smoking status: Never Smoker  . Smokeless tobacco: Never Used  Vaping Use  . Vaping Use: Never used  Substance Use Topics  . Alcohol use: Yes    Alcohol/week: 0.0 standard drinks    Comment: rare  . Drug use: No    ROS   Objective:   Vitals: BP 124/64 (BP Location: Right Arm)   Pulse 81   Temp 99.9 F (37.7 C) (Oral)   Resp 18   SpO2 95%   Physical Exam Constitutional:      General: He is not in acute distress.    Appearance: Normal appearance. He is well-developed and normal weight. He is not ill-appearing, toxic-appearing or diaphoretic.  HENT:     Head: Normocephalic and atraumatic.     Right Ear: External ear normal.     Left  Ear: External ear normal.     Nose: Nose normal.     Mouth/Throat:     Mouth: Mucous membranes are moist.     Pharynx: Oropharynx is clear.  Eyes:     General: No scleral icterus.       Right eye: No discharge.        Left eye: No discharge.     Extraocular Movements: Extraocular movements intact.     Pupils:  Pupils are equal, round, and reactive to light.  Cardiovascular:     Rate and Rhythm: Normal rate and regular rhythm.     Heart sounds: Normal heart sounds. No murmur heard.  No friction rub. No gallop.   Pulmonary:     Effort: Pulmonary effort is normal. No respiratory distress.     Breath sounds: Normal breath sounds. No stridor. No wheezing, rhonchi or rales.  Abdominal:     General: Bowel sounds are normal. There is no distension.     Palpations: Abdomen is soft. There is no mass.     Tenderness: There is abdominal tenderness (throughout, worse over right lateral side). There is left CVA tenderness. There is no right CVA tenderness, guarding or rebound.  Musculoskeletal:     Cervical back: Normal range of motion.  Skin:    General: Skin is warm and dry.     Findings: No rash.  Neurological:     Mental Status: He is alert and oriented to person, place, and time.  Psychiatric:        Mood and Affect: Mood normal.        Behavior: Behavior normal.        Thought Content: Thought content normal.        Judgment: Judgment normal.     Results for orders placed or performed during the hospital encounter of 08/05/19 (from the past 24 hour(s))  POCT urinalysis dip (device)     Status: None   Collection Time: 08/05/19  6:33 PM  Result Value Ref Range   Glucose, UA NEGATIVE NEGATIVE mg/dL   Bilirubin Urine NEGATIVE NEGATIVE   Ketones, ur NEGATIVE NEGATIVE mg/dL   Specific Gravity, Urine 1.020 1.005 - 1.030   Hgb urine dipstick NEGATIVE NEGATIVE   pH 7.0 5.0 - 8.0   Protein, ur NEGATIVE NEGATIVE mg/dL   Urobilinogen, UA 0.2 0.0 - 1.0 mg/dL   Nitrite NEGATIVE NEGATIVE    Leukocytes,Ua NEGATIVE NEGATIVE   DG Chest 2 View  Result Date: 08/05/2019 CLINICAL DATA:  Fever and shortness of breath EXAM: CHEST - 2 VIEW COMPARISON:  05/15/2019 FINDINGS: Heart size is normal. Tortuous aorta. Poor inspiration. Possible mild right base atelectasis. Other than that, the lungs are felt to be clear. No infiltrate, lobar collapse or effusion. No significant bone finding. IMPRESSION: Poor inspiration. Possible mild right base atelectasis. No active disease suspected otherwise. Electronically Signed   By: Nelson Chimes M.D.   On: 08/05/2019 19:24   Assessment and Plan :   PDMP not reviewed this encounter.  1. Fever, unspecified   2. Chronic abdominal pain   3. Malaise     COVID 19 testing pending. Will cover for possible tick born illness with doxycycline. Emphasized need for close follow up with PCP. Continue APAP for fevers, aches and pains. Counseled patient on potential for adverse effects with medications prescribed/recommended today, ER and return-to-clinic precautions discussed, patient verbalized understanding.    Jaynee Eagles, [place] 08/05/19 1942

## 2019-08-05 NOTE — ED Triage Notes (Signed)
Pt presents today with left upper quadrant pain and fever. Pt states he had negative CT three days ago. Pt febrile to 101.9 prior to arrival. PT 99.9 at this time. Pt treating with tylenol, last dose 1200.

## 2019-08-06 LAB — URINE CULTURE: Culture: NO GROWTH

## 2019-08-06 LAB — SARS CORONAVIRUS 2 (TAT 6-24 HRS): SARS Coronavirus 2: NEGATIVE

## 2019-08-24 ENCOUNTER — Other Ambulatory Visit: Payer: Self-pay | Admitting: Cardiology

## 2019-08-24 DIAGNOSIS — I10 Essential (primary) hypertension: Secondary | ICD-10-CM

## 2019-09-08 ENCOUNTER — Other Ambulatory Visit: Payer: Self-pay | Admitting: Rheumatology

## 2019-09-08 ENCOUNTER — Other Ambulatory Visit: Payer: Self-pay | Admitting: Internal Medicine

## 2019-09-08 DIAGNOSIS — E785 Hyperlipidemia, unspecified: Secondary | ICD-10-CM

## 2019-09-08 NOTE — Telephone Encounter (Signed)
Last Visit: 05/27/2019 Next Visit: patient will schedule.  UDS: 06/16/2019 c/w Narc Agreement: 06/16/2019  Last fill: 06/19/2019  Okay to refill Tramadol?

## 2019-09-09 MED ORDER — TRAMADOL HCL 50 MG PO TABS: ORAL_TABLET | ORAL | 0 refills | Status: DC

## 2019-10-10 ENCOUNTER — Other Ambulatory Visit: Payer: Self-pay | Admitting: Internal Medicine

## 2019-10-10 DIAGNOSIS — E039 Hypothyroidism, unspecified: Secondary | ICD-10-CM

## 2019-11-26 ENCOUNTER — Other Ambulatory Visit: Payer: Self-pay | Admitting: Internal Medicine

## 2019-11-26 ENCOUNTER — Other Ambulatory Visit: Payer: Self-pay | Admitting: Rheumatology

## 2019-11-26 DIAGNOSIS — G4700 Insomnia, unspecified: Secondary | ICD-10-CM

## 2019-11-27 ENCOUNTER — Encounter: Payer: Self-pay | Admitting: Internal Medicine

## 2019-11-27 ENCOUNTER — Other Ambulatory Visit: Payer: Self-pay | Admitting: Internal Medicine

## 2019-11-27 DIAGNOSIS — B002 Herpesviral gingivostomatitis and pharyngotonsillitis: Secondary | ICD-10-CM

## 2019-11-27 MED ORDER — VALACYCLOVIR HCL 1 G PO TABS: 1000.0000 mg | ORAL_TABLET | Freq: Two times a day (BID) | ORAL | 5 refills | Status: DC

## 2019-11-27 MED ORDER — TRAZODONE HCL 100 MG PO TABS: 100.0000 mg | ORAL_TABLET | Freq: Every evening | ORAL | 1 refills | Status: DC | PRN

## 2019-11-27 NOTE — Progress Notes (Signed)
Office Visit Note  Patient: Jonathan Powers             Date of Birth: Aug 23, 1950           MRN: [id number]             PCP: [name], MD Referring: [name], MD Visit Date: 11/28/2019 Occupation: @GUAROCC @  Subjective:  Increased pain in hands.   History of Present Illness: Jonathan Powers is a 69 y.o. male with history of inflammatory arthritis, gouty arthropathy.  He was treated in the past with Uloric which was a stopped after the warning on the Uloric.  He states he has been having increased pain and discomfort in his bilateral hands, shoulders, knee joints and his entire spine.  He had left foot fusion last year which has doing well.  He takes tramadol 1 tablet at bedtime for chronic pain and discomfort.  He states he is able to sleep once he takes tramadol otherwise he has lot of discomfort.  He describes his pain on the level of 0-10 about 8 without tramadol and with tramadol about 4.   Activities of Daily Living:  Patient reports morning stiffness for 2 hours.   Patient Reports nocturnal pain.  Difficulty dressing/grooming: Reports Difficulty climbing stairs: Reports Difficulty getting out of chair: Reports Difficulty using hands for taps, buttons, cutlery, and/or writing: Reports  Review of Systems  Constitutional: Positive for fatigue.  HENT: Negative for mouth dryness.   Eyes: Positive for dryness.  Respiratory: Positive for shortness of breath.   Cardiovascular: Positive for swelling in legs/feet.  Gastrointestinal: Positive for diarrhea.  Endocrine: Negative for increased urination.  Genitourinary: Negative for difficulty urinating.  Musculoskeletal: Positive for arthralgias, gait problem, joint pain, joint swelling, muscle weakness, morning stiffness and muscle tenderness.  Skin: Negative for rash.  Allergic/Immunologic: Negative for susceptible to infections.  Neurological: Positive for numbness and weakness.  Hematological: Negative for  bruising/bleeding tendency.  Psychiatric/Behavioral: Positive for sleep disturbance.    PMFS History:  Patient Active Problem List   Diagnosis Date Noted  . Generalized abdominal tenderness without rebound tenderness 07/16/2019  . Failed total hip arthroplasty ([place]) 03/06/2019  . Intestinal infection due to Clostridioides difficile 11/28/2018  . Diarrhea of presumed infectious origin 11/26/2018  . Recurrent genital HSV (herpes simplex virus) infection 11/12/2018  . Other specified hypothyroidism 11/11/2018  . Insomnia 11/10/2018  . GERD (gastroesophageal reflux disease) 11/10/2018  . Seasonal allergies 11/10/2018  . Recurrent oral herpes simplex 09/23/2018  . Age-related osteoporosis with current pathological fracture 03/11/2018  . Flexural eczema 07/24/2017  . Routine general medical examination at a health care facility 10/12/2016  . Chronic right-sided low back pain without sciatica 07/21/2016  . Low testosterone 07/21/2016  . Idiopathic chronic gout of multiple sites without tophus 07/21/2016  . Superior mesenteric artery aneurysm ([place]) 07/12/2015  . Left thyroid nodule   . OSA (obstructive sleep apnea) 01/27/2013  . Hypothyroid   . Symptomatic PVCs 09/06/2012  . Dyslipidemia, goal LDL below 130 09/06/2012  . BPH (benign prostatic hyperplasia) 09/06/2012  . Essential hypertension 05/17/2006  . ALLERGIC RHINITIS 05/17/2006  . OSTEOARTHRITIS 05/17/2006    Past Medical History:  Diagnosis Date  . ALLERGIC RHINITIS   . Cancer ([place])    Melonoma back  . CELLULITIS, LEG, RIGHT    Recurrent R hip cellulitis 1/08,3/08   . Diverticulitis   . GERD (gastroesophageal reflux disease)   . Gout   . Hashimoto's thyroiditis   .  Hypertension   . Hypogonadism male    low T, a/w ED  . Hypothyroid    goiters   . Left thyroid nodule 2008   on [place], decrease size in 2011 [place]  . Migraines    Atypical and ocular  . OSTEOARTHRITIS   . Seasonal allergies   . Sleep apnea    cpap     Family History  Problem Relation Age of Onset  . Arthritis Father   . Heart disease Father   . Diabetes Father   . Vascular Disease Father        AoBifem  . Arthritis Mother   . Diabetes Mother   . Multiple sclerosis Mother   . Cancer Paternal Grandmother        "GI"  . Cancer Paternal Grandfather        stomach cancer  . Thyroid cancer Sister   . Uterine cancer Sister    Past Surgical History:  Procedure Laterality Date  . CARPAL TUNNEL RELEASE Left 03/30/2017   Procedure: CARPAL TUNNEL RELEASE;  Surgeon: Garald Balding, MD;  Location: [facility];  Service: Orthopedics;  Laterality: Left;  . CHOLECYSTECTOMY  2004  . CTR Bilateral 1982  . CYSTOSCOPY  1974  . FOOT FUSION Left 06/22/2017  . HEMORRHOID SURGERY N/A 03/30/2017   Procedure: HEMORRHOIDECTOMY;  Surgeon: Coralie Keens, MD;  Location: [facility];  Service: General;  Laterality: N/A;  . open reduction L little finger Left 1970  . Repair digital thumb, left Left 1990  . Right shoulder cuff repair Right 09/15/11  . Right shoulder SAD, DCR Right 02/05/11  . TOTAL HIP ARTHROPLASTY Left 07/26/06  . TOTAL HIP ARTHROPLASTY Right 1999  . TOTAL HIP REVISION Left 03/07/2019   Procedure: LEFT TOTAL HIP REVISION POSTERIOR APPROACH;  Surgeon: Frederik Pear, MD;  Location: [facility];  Service: Orthopedics;  Laterality: Left;   Social History   Social History Narrative   ortho PA, married, lives with spouse   Immunization History  Administered Date(s) Administered  . Influenza Split 12/21/2013  . Influenza, High Dose Seasonal PF 11/20/2017, 11/21/2018  . Influenza,inj,Quad PF,6+ Mos 12/11/2012, 11/20/2016  . Influenza-Unspecified 11/21/2014, 11/19/2015  . PFIZER SARS-COV-2 Vaccination 02/28/2019, 03/18/2019  . Pneumococcal Conjugate-13 02/19/2017  . Pneumococcal Polysaccharide-23 07/12/2015  . Tdap 07/12/2015     Objective: Vital Signs: BP 111/67 (BP Location: Left Arm, Patient Position:  Sitting, Cuff Size: Normal)   Pulse 72   Resp 16   Ht 5\' 9"  (1.753 m)   Wt 275 lb (124.7 kg)   BMI 40.61 kg/m    Physical Exam Vitals and nursing note reviewed.  Constitutional:      Appearance: He is well-developed.  HENT:     Head: Normocephalic and atraumatic.  Eyes:     Conjunctiva/sclera: Conjunctivae normal.     Pupils: Pupils are equal, round, and reactive to light.  Cardiovascular:     Rate and Rhythm: Normal rate and regular rhythm.     Heart sounds: Normal heart sounds.  Pulmonary:     Effort: Pulmonary effort is normal.     Breath sounds: Normal breath sounds.  Abdominal:     General: Bowel sounds are normal.     Palpations: Abdomen is soft.  Musculoskeletal:     Cervical back: Normal range of motion and neck supple.  Skin:    General: Skin is warm and dry.     Capillary Refill: Capillary refill takes less than 2 seconds.  Neurological:  Mental Status: He is alert and oriented to person, place, and time.  Psychiatric:        Behavior: Behavior normal.      Musculoskeletal Exam: C-spine was in good range of motion with some stiffness.  He has painful range of motion of bilateral shoulders.  Elbow joints were in good range of motion.  He has some tenosynovitis in his left first.  CMC, PIP and DIP thickening was noted.  Painful range of motion of his left hip joint.  Knee joints were in good range of motion with some discomfort in his left knee joint.  No tenderness over ankle joints or MTPs was noted.  CDAI Exam: CDAI Score: -- Patient Global: --; Provider Global: -- Swollen: --; Tender: -- Joint Exam 11/28/2019   No joint exam has been documented for this visit   There is currently no information documented on the homunculus. Go to the Rheumatology activity and complete the homunculus joint exam.  Investigation: No additional findings.  Imaging: No results found.  Recent Labs: Lab Results  Component Value Date   WBC 7.4 07/16/2019   HGB 14.9  07/16/2019   PLT 282.0 07/16/2019   NA 137 07/16/2019   K 3.7 07/16/2019   CL 102 07/16/2019   CO2 31 07/16/2019   GLUCOSE 102 (H) 07/16/2019   BUN 13 07/16/2019   CREATININE 0.94 07/16/2019   BILITOT 0.5 07/16/2019   ALKPHOS 55 07/16/2019   AST 22 07/16/2019   ALT 18 07/16/2019   PROT 6.9 07/16/2019   ALBUMIN 4.4 07/16/2019   CALCIUM 9.4 07/16/2019   GFRAA >60 05/15/2019    Speciality Comments: No specialty comments available.  Procedures:  No procedures performed Allergies: Nsaids, Tolmetin, and Penicillins   Assessment / Plan:     Visit Diagnoses: Idiopathic chronic gout of multiple sites without tophus -patient was treated with Uloric in the past.  But she discontinued the medication with the black box warning.  He does not recall if he had big improvement while he was on Uloric.  He also tried colchicine which effective initially but then to start working.  He had mild tenosynovitis in his left wrist joint.  None of the other joints are swollen.  Plan: Uric acid  Medication monitoring encounter -he has been on tramadol for the management of chronic pain.  He takes tramadol 50 mg p.o. nightly.  He has nocturnal pain and insomnia due to discomfort.  Tramadol has been effective in controlling his symptoms.  We will obtain following labs today.  Plan: CBC with Differential/Platelet, COMPLETE METABOLIC PANEL WITH GFR, DRUG MONITOR, PANEL 5, W/CONF, URINE, DRUG MONITOR, TRAMADOL,QN, URINE  Primary osteoarthritis of both hands-he has bilateral DIP and PIP thickening in his hands consistent with osteoarthritis.  S/P right rotator cuff repair  Pes planus of both feet-he is using shoe inserts.  Status post bilateral total hip replacement-he has been experiencing pain and discomfort in his left hip joint and some discomfort with range of motion today.  DDD (degenerative disc disease), cervical-he had good range of motion with discomfort.  DDD (degenerative disc disease), lumbar-he  has chronic discomfort in his lumbar region.  History of hypertension-blood pressure is well controlled.   Other medical problems are listed as follows:  History of hypothyroidism Hashimotos  History of gastroesophageal reflux (GERD)  History of diverticulitis  History of vitamin D deficiency  Low testosterone  History of sleep apnea  He is fully vaccinated against COVID-19 and will be getting a booster  today.  Orders: Orders Placed This Encounter  Procedures  . CBC with Differential/Platelet  . COMPLETE METABOLIC PANEL WITH GFR  . Uric acid  . DRUG MONITOR, PANEL 5, W/CONF, URINE  . DRUG MONITOR, TRAMADOL,QN, URINE   No orders of the defined types were placed in this encounter.   Follow-Up Instructions: Return in about 6 months (around 05/28/2020) for Osteoarthritis, gout.   Bo Merino, MD  Note - This record has been created using Editor, commissioning.  Chart creation errors have been sought, but may not always  have been located. Such creation errors do not reflect on  the standard of medical care.

## 2019-11-27 NOTE — Telephone Encounter (Signed)
Last Visit: 05/27/2019 Next Visit: patient will schedule.  UDS: 06/16/2019 c/w Narc Agreement: 06/16/2019  Last fill: 09/09/2019  Okay to refill Tramadol?

## 2019-11-27 NOTE — Telephone Encounter (Signed)
Attempted to contact the patient and left message for patient to call the office. Advised he will need to update UDS and needs a follow up visit.

## 2019-11-28 ENCOUNTER — Encounter: Payer: Self-pay | Admitting: Rheumatology

## 2019-11-28 ENCOUNTER — Other Ambulatory Visit: Payer: Self-pay

## 2019-11-28 ENCOUNTER — Ambulatory Visit (INDEPENDENT_AMBULATORY_CARE_PROVIDER_SITE_OTHER): Payer: Medicare Other | Admitting: Rheumatology

## 2019-11-28 VITALS — BP 111/67 | HR 72 | Resp 16 | Ht 69.0 in | Wt 275.0 lb

## 2019-11-28 DIAGNOSIS — Z8719 Personal history of other diseases of the digestive system: Secondary | ICD-10-CM

## 2019-11-28 DIAGNOSIS — Z96643 Presence of artificial hip joint, bilateral: Secondary | ICD-10-CM | POA: Diagnosis not present

## 2019-11-28 DIAGNOSIS — M2141 Flat foot [pes planus] (acquired), right foot: Secondary | ICD-10-CM | POA: Diagnosis not present

## 2019-11-28 DIAGNOSIS — M5030 Other cervical disc degeneration, unspecified cervical region: Secondary | ICD-10-CM | POA: Diagnosis not present

## 2019-11-28 DIAGNOSIS — M51369 Other intervertebral disc degeneration, lumbar region without mention of lumbar back pain or lower extremity pain: Secondary | ICD-10-CM

## 2019-11-28 DIAGNOSIS — M2142 Flat foot [pes planus] (acquired), left foot: Secondary | ICD-10-CM

## 2019-11-28 DIAGNOSIS — Z9889 Other specified postprocedural states: Secondary | ICD-10-CM | POA: Diagnosis not present

## 2019-11-28 DIAGNOSIS — Z8639 Personal history of other endocrine, nutritional and metabolic disease: Secondary | ICD-10-CM

## 2019-11-28 DIAGNOSIS — R7989 Other specified abnormal findings of blood chemistry: Secondary | ICD-10-CM | POA: Diagnosis not present

## 2019-11-28 DIAGNOSIS — M5136 Other intervertebral disc degeneration, lumbar region: Secondary | ICD-10-CM | POA: Diagnosis not present

## 2019-11-28 DIAGNOSIS — Z8679 Personal history of other diseases of the circulatory system: Secondary | ICD-10-CM

## 2019-11-28 DIAGNOSIS — M19041 Primary osteoarthritis, right hand: Secondary | ICD-10-CM | POA: Diagnosis not present

## 2019-11-28 DIAGNOSIS — M19042 Primary osteoarthritis, left hand: Secondary | ICD-10-CM

## 2019-11-28 DIAGNOSIS — Z5181 Encounter for therapeutic drug level monitoring: Secondary | ICD-10-CM | POA: Diagnosis not present

## 2019-11-28 DIAGNOSIS — Z8669 Personal history of other diseases of the nervous system and sense organs: Secondary | ICD-10-CM

## 2019-11-28 DIAGNOSIS — M1A09X0 Idiopathic chronic gout, multiple sites, without tophus (tophi): Secondary | ICD-10-CM

## 2019-11-28 MED ORDER — TRAMADOL HCL 50 MG PO TABS: ORAL_TABLET | ORAL | 0 refills | Status: DC

## 2019-11-29 NOTE — Progress Notes (Signed)
BC is normal, CMP is normal except glucose is mildly elevated as the sample was not fasting.  Uric acid is 9.0 which is elevated.  We discussed use of Uloric/allopurinol and colchicine at the last visit.  Please discuss if Jonathan Powers is interested in restarting medications.

## 2019-12-01 ENCOUNTER — Other Ambulatory Visit: Payer: Self-pay | Admitting: *Deleted

## 2019-12-01 ENCOUNTER — Ambulatory Visit: Payer: Medicare Other | Admitting: Cardiology

## 2019-12-01 DIAGNOSIS — Z5181 Encounter for therapeutic drug level monitoring: Secondary | ICD-10-CM

## 2019-12-01 MED ORDER — ALLOPURINOL 100 MG PO TABS: ORAL_TABLET | ORAL | 0 refills | Status: DC

## 2019-12-01 MED ORDER — COLCHICINE 0.6 MG PO TABS
0.6000 mg | ORAL_TABLET | Freq: Every day | ORAL | 2 refills | Status: DC
Start: 2019-12-01 — End: 2020-01-20

## 2019-12-01 NOTE — Progress Notes (Signed)
Please send a prescription for colchicine 0.6 mg p.o. daily.  Start allopurinol 100 mg, 1 tablet p.o. daily for 1 week, increase to 200 mg p.o. daily for the next week and then 300 mg p.o. daily.  He should get labs in 4 weeks which would include CBC, CMP with GFR and uric acid.  If he gets a flare he will increase colchicine to 0.6 mg p.o. twice daily for flares only.  He will continue on on colchicine until his uric acid is below 6 and he has had no flares for 6 months.

## 2019-12-01 NOTE — Telephone Encounter (Signed)
-----   Message from Bo Merino, MD sent at 12/01/2019 12:45 PM EDT ----- Please send a prescription for colchicine 0.6 mg p.o. daily.  Start allopurinol 100 mg, 1 tablet p.o. daily for 1 week, increase to 200 mg p.o. daily for the next week and then 300 mg p.o. daily.  He should get labs in 4 weeks which would include CBC , CMP with GFR and uric acid.  If he gets a flare he will increase colchicine to 0.6 mg p.o. twice daily for flares only.  He will continue on on colchicine until his uric acid is below 6 and he has had no flares for 6 months.

## 2019-12-02 LAB — DRUG MONITOR, PANEL 5, W/CONF, URINE
Amphetamines: NEGATIVE ng/mL (ref <500)
Barbiturates: NEGATIVE ng/mL (ref <300)
Benzodiazepines: NEGATIVE ng/mL (ref <100)
Cocaine Metabolite: NEGATIVE ng/mL (ref <150)
Creatinine: 105.2 mg/dL
Marijuana Metabolite: 31 ng/mL — ABNORMAL HIGH (ref <5)
Marijuana Metabolite: POSITIVE ng/mL — AB (ref <20)
Methadone Metabolite: NEGATIVE ng/mL (ref <100)
Opiates: NEGATIVE ng/mL (ref <100)
Oxidant: NEGATIVE ug/mL
Oxycodone: NEGATIVE ng/mL (ref <100)
pH: 5.4 (ref 4.5–9.0)

## 2019-12-02 LAB — COMPLETE METABOLIC PANEL WITH GFR
AG Ratio: 1.8 (calc) (ref 1.0–2.5)
ALT: 17 U/L (ref 9–46)
AST: 21 U/L (ref 10–35)
Albumin: 4.3 g/dL (ref 3.6–5.1)
Alkaline phosphatase (APISO): 51 U/L (ref 35–144)
BUN: 14 mg/dL (ref 7–25)
CO2: 28 mmol/L (ref 20–32)
Calcium: 9.5 mg/dL (ref 8.6–10.3)
Chloride: 103 mmol/L (ref 98–110)
Creat: 0.86 mg/dL (ref 0.70–1.25)
GFR, Est African American: 103 mL/min/{1.73_m2} (ref >=60)
GFR, Est Non African American: 89 mL/min/{1.73_m2} (ref >=60)
Globulin: 2.4 g/dL (calc) (ref 1.9–3.7)
Glucose, Bld: 122 mg/dL — ABNORMAL HIGH (ref 65–99)
Potassium: 3.9 mmol/L (ref 3.5–5.3)
Sodium: 139 mmol/L (ref 135–146)
Total Bilirubin: 0.6 mg/dL (ref 0.2–1.2)
Total Protein: 6.7 g/dL (ref 6.1–8.1)

## 2019-12-02 LAB — CBC WITH DIFFERENTIAL/PLATELET
Absolute Monocytes: 811 cells/uL (ref 200–950)
Basophils Absolute: 39 cells/uL (ref 0–200)
Basophils Relative: 0.5 %
Eosinophils Absolute: 203 cells/uL (ref 15–500)
Eosinophils Relative: 2.6 %
HCT: 43 % (ref 38.5–50.0)
Hemoglobin: 14.8 g/dL (ref 13.2–17.1)
Lymphs Abs: 2098 cells/uL (ref 850–3900)
MCH: 30.6 pg (ref 27.0–33.0)
MCHC: 34.4 g/dL (ref 32.0–36.0)
MCV: 89 fL (ref 80.0–100.0)
MPV: 9.3 fL (ref 7.5–12.5)
Monocytes Relative: 10.4 %
Neutro Abs: 4649 cells/uL (ref 1500–7800)
Neutrophils Relative %: 59.6 %
Platelets: 328 10*3/uL (ref 140–400)
RBC: 4.83 10*6/uL (ref 4.20–5.80)
RDW: 12.3 % (ref 11.0–15.0)
Total Lymphocyte: 26.9 %
WBC: 7.8 10*3/uL (ref 3.8–10.8)

## 2019-12-02 LAB — DM TEMPLATE

## 2019-12-02 LAB — DRUG MONITOR, TRAMADOL,QN, URINE
Desmethyltramadol: 589 ng/mL — ABNORMAL HIGH (ref <100)
Tramadol: 1110 ng/mL — ABNORMAL HIGH (ref <100)

## 2019-12-02 LAB — URIC ACID: Uric Acid, Serum: 9 mg/dL — ABNORMAL HIGH (ref 4.0–8.0)

## 2019-12-02 NOTE — Progress Notes (Signed)
I discussed the UDS results with Jonathan Powers.  He states he has a friend who smokes marijuana.  He has never smoked marijuana.  I explained to him that I would not be able to refill tramadol in the future and we will cancel the narcotic agreement.  He voiced understanding.

## 2019-12-03 ENCOUNTER — Encounter: Payer: Self-pay | Admitting: Cardiology

## 2019-12-03 ENCOUNTER — Ambulatory Visit: Payer: Medicare Other | Admitting: Cardiology

## 2019-12-03 ENCOUNTER — Other Ambulatory Visit: Payer: Self-pay

## 2019-12-03 VITALS — BP 125/58 | HR 59 | Resp 16 | Ht 69.0 in | Wt 280.0 lb

## 2019-12-03 DIAGNOSIS — E6609 Other obesity due to excess calories: Secondary | ICD-10-CM | POA: Diagnosis not present

## 2019-12-03 DIAGNOSIS — E782 Mixed hyperlipidemia: Secondary | ICD-10-CM

## 2019-12-03 DIAGNOSIS — I10 Essential (primary) hypertension: Secondary | ICD-10-CM | POA: Diagnosis not present

## 2019-12-03 DIAGNOSIS — Z6837 Body mass index (BMI) 37.0-37.9, adult: Secondary | ICD-10-CM

## 2019-12-03 DIAGNOSIS — F3289 Other specified depressive episodes: Secondary | ICD-10-CM

## 2019-12-03 DIAGNOSIS — R931 Abnormal findings on diagnostic imaging of heart and coronary circulation: Secondary | ICD-10-CM | POA: Diagnosis not present

## 2019-12-03 MED ORDER — BUPROPION HCL ER (SR) 150 MG PO TB12: 150.0000 mg | ORAL_TABLET | Freq: Two times a day (BID) | ORAL | 3 refills | Status: DC

## 2019-12-03 MED ORDER — PHENTERMINE HCL 30 MG PO CAPS: 30.0000 mg | ORAL_CAPSULE | ORAL | 3 refills | Status: DC

## 2019-12-03 NOTE — Progress Notes (Signed)
Primary Physician/Referring:  [name], MD  Referring Bo Merino, MD Patient ID: Jonathan Powers, male    DOB: 1950/07/03, 69 y.o.   MRN: [id number]  Chief Complaint  Patient presents with  . Hypertension  . Follow-up    6 month   HPI:    Jonathan Powers  is a 69 y.o. Caucasian male with history of idiopathic gout, hypertension, mild hypertriglyceridemia and metabolic syndrome with elevated blood sugar without diabetes, moderate obesity, obstructive sleep apnea on CPAP evaluted by me for hypertension and chest pain and has had a Coronary CTA on 11/19/2018 which revealed mild luminal irregularity and a calcium score of 250.  Presents for 6 month follow up.  At last visit discontinued telmisartan and started olmesartan/HCTZ.  BNP was ordered but was not done.  Patient is presently doing well, his only complaint today  left upper chest pain and shoulder pain with radiation to his left arm, however he also has chronic arthritis in his left arm.  This discomfort is nonexertional and not associated with shortness of breath.  Dyspnea is stable and he denies PND or orthopnea.  Continues to have mild chronic leg edema.  Past Medical History:  Diagnosis Date  . ALLERGIC RHINITIS   . Cancer ([place])    Melonoma back  . CELLULITIS, LEG, RIGHT    Recurrent R hip cellulitis 1/08,3/08   . Diverticulitis   . GERD (gastroesophageal reflux disease)   . Gout   . Hashimoto's thyroiditis   . Hypertension   . Hypogonadism male    low T, a/w ED  . Hypothyroid    goiters   . Left thyroid nodule 2008   on [place], decrease size in 2011 [place]  . Migraines    Atypical and ocular  . OSTEOARTHRITIS   . Seasonal allergies   . Sleep apnea    cpap   Past Surgical History:  Procedure Laterality Date  . CARPAL TUNNEL RELEASE Left 03/30/2017   Procedure: CARPAL TUNNEL RELEASE;  Surgeon: Garald Balding, MD;  Location: [facility];  Service: Orthopedics;  Laterality: Left;  .  CHOLECYSTECTOMY  2004  . CTR Bilateral 1982  . CYSTOSCOPY  1974  . FOOT FUSION Left 06/22/2017  . HEMORRHOID SURGERY N/A 03/30/2017   Procedure: HEMORRHOIDECTOMY;  Surgeon: Coralie Keens, MD;  Location: [facility];  Service: General;  Laterality: N/A;  . open reduction L little finger Left 1970  . Repair digital thumb, left Left 1990  . Right shoulder cuff repair Right 09/15/11  . Right shoulder SAD, DCR Right 02/05/11  . TOTAL HIP ARTHROPLASTY Left 07/26/06  . TOTAL HIP ARTHROPLASTY Right 1999  . TOTAL HIP REVISION Left 03/07/2019   Procedure: LEFT TOTAL HIP REVISION POSTERIOR APPROACH;  Surgeon: Frederik Pear, MD;  Location: [facility];  Service: Orthopedics;  Laterality: Left;   Social History   Tobacco Use  . Smoking status: Never Smoker  . Smokeless tobacco: Never Used  Substance Use Topics  . Alcohol use: Yes    Alcohol/week: 0.0 standard drinks    Comment: rare   Marital Status: Married   ROS  Review of Systems  Constitutional: Positive for weight gain.  Cardiovascular: Positive for dyspnea on exertion (stable). Negative for chest pain and leg swelling.  Musculoskeletal: Positive for arthritis, back pain, joint pain (neck and left shoulder) and neck pain.  Gastrointestinal: Negative for melena.   Objective  Blood pressure (!) 125/58, pulse (!) 59, resp. rate 16, height 5'  9" (1.753 m), weight 280 lb (127 kg), SpO2 96 %. Body mass index is 41.35 kg/m.  Vitals with BMI 12/03/2019 11/28/2019 08/05/2019  Height 5\' 9"  5\' 9"  -  Weight 280 lbs 275 lbs -  BMI 95.18 84.16 -  Systolic 606 301 601  Diastolic 58 67 64  Pulse 59 72 81  Some encounter information is confidential and restricted. Go to Review Flowsheets activity to see all data.     Physical Exam Constitutional:      General: He is not in acute distress.    Appearance: He is well-developed.     Comments: Moderately obese  Neck:     Comments: Short neck and difficult to evaluate JVP Cardiovascular:      Rate and Rhythm: Normal rate and regular rhythm.     Pulses:          Carotid pulses are 2+ on the right side and 2+ on the left side.      Radial pulses are 2+ on the right side and 2+ on the left side.       Dorsalis pedis pulses are 2+ on the right side and 2+ on the left side.       Posterior tibial pulses are 2+ on the right side and 2+ on the left side.     Heart sounds: Normal heart sounds. No murmur heard.  No gallop.      Comments: 1+ bilateral pitting leg edema, left > right.  No JVD. Pulmonary:     Effort: Pulmonary effort is normal.     Breath sounds: Normal breath sounds.  Abdominal:     General: Bowel sounds are normal.     Palpations: Abdomen is soft.     Comments: Obese.     Laboratory examination:   Recent Labs    03/08/19 0503 03/08/19 0503 05/15/19 2036 07/16/19 1534 11/28/19 1357  NA 134*   < > 142 137 139  K 4.7   < > 4.2 3.7 3.9  CL 101   < > 105 102 103  CO2 25   < > 28 31 28   GLUCOSE 156*   < > 105* 102* 122*  BUN 16   < > 9 13 14   CREATININE 1.18   < > 1.29* 0.94 0.86  CALCIUM 8.4*   < > 9.2 9.4 9.5  GFRNONAA >60  --  57*  --  89  GFRAA >60  --  >60  --  103   < > = values in this interval not displayed.   CMP Latest Ref Rng & Units 11/28/2019 07/16/2019 05/15/2019  Glucose 65 - 99 mg/dL 122(H) 102(H) 105(H)  BUN 7 - 25 mg/dL 14 13 9   Creatinine 0.70 - 1.25 mg/dL 0.86 0.94 1.29(H)  Sodium 135 - 146 mmol/L 139 137 142  Potassium 3.5 - 5.3 mmol/L 3.9 3.7 4.2  Chloride 98 - 110 mmol/L 103 102 105  CO2 20 - 32 mmol/L 28 31 28   Calcium 8.6 - 10.3 mg/dL 9.5 9.4 9.2  Total Protein 6.1 - 8.1 g/dL 6.7 6.9 -  Total Bilirubin 0.2 - 1.2 mg/dL 0.6 0.5 -  Alkaline Phos 39 - 117 U/L - 55 -  AST 10 - 35 U/L 21 22 -  ALT 9 - 46 U/L 17 18 -   CBC Latest Ref Rng & Units 11/28/2019 07/16/2019 05/15/2019  WBC 3.8 - 10.8 Thousand/uL 7.8 7.4 7.6  Hemoglobin 13.2 - 17.1 g/dL 14.8 14.9 14.9  Hematocrit 38 -  50 % 43.0 42.8 44.7  Platelets 140 - 400 Thousand/uL 328  282.0 259    Lipid Panel     Component Value Date/Time   CHOL 108 11/19/2018 0931   TRIG 235 (H) 11/19/2018 0931   HDL 39 (L) 11/19/2018 0931   CHOLHDL 2.8 11/19/2018 0931   VLDL 35.6 02/05/2018 1629   LDLCALC 40 11/19/2018 0931   HEMOGLOBIN A1C Lab Results  Component Value Date   HGBA1C 5.1 11/09/2018   MPG 99.67 11/09/2018   TSH Recent Labs    07/16/19 1534  TSH 1.26   Medications   Current Outpatient Medications  Medication Instructions  . acetaminophen (TYLENOL) 1,000 mg, Oral, Every 6 hours PRN  . allopurinol (ZYLOPRIM) 100 MG tablet Take 1 tablet p.o. daily for 1 week, increase 2 tabs p.o. daily for the next week and then increase to 3 tabs daily.  [name] amLODipine (NORVASC) 2.5 MG tablet TAKE ONE TABLET BY MOUTH ONE TIME DAILY   . buPROPion (WELLBUTRIN SR) 150 mg, Oral, 2 times daily, Start with 1 pill once daily for the first 3 days. Then 1 pill twice daily.  . cetirizine (ZYRTEC) 10 mg, Daily  . colchicine 0.6 mg, Oral, Daily  . diclofenac sodium (VOLTAREN) 1 % GEL 2-4 grams qid as needed for pain as discussed in office  . doxycycline (VIBRAMYCIN) 100 mg, Oral, 2 times daily  . levothyroxine (SYNTHROID) 175 MCG tablet TAKE 1 TABLET BY MOUTH DAILY BEFORE BREAKFAST  . olmesartan-hydrochlorothiazide (BENICAR HCT) 40-25 MG tablet TAKE 1 TABLET BY MOUTH EVERY DAY  . phentermine 30 mg, Oral, BH-each morning  . rosuvastatin (CRESTOR) 5 MG tablet TAKE ONE TABLET BY MOUTH DAILY AT BEDTIME  . tadalafil (CIALIS) 5 mg, Oral, Daily at bedtime  . Testosterone 1.62 % GEL 3 Pump, Topical, Daily  . traZODone (DESYREL) 100 mg, Oral, At bedtime PRN  . valACYclovir (VALTREX) 1,000 mg, Oral, 2 times daily   Radiology:   Coronary CTA 11/11/2018:  1. Minimal non-obstructive CAD, CADRADS = 0. 2. Coronary calcium score of 250.78. This was 66th percentile for age and sex matched control. 3. Normal coronary origin with right dominance. 4. Atherosclerotic calcification of the  aorta.  Chest x-ray 05/15/2019: Mild right basilar linear atelectasis without active cardiopulmonary disease.  Chest x-ray 08/05/2019:  Poor inspiration. Possible mild right base atelectasis. No active disease suspected otherwise.  Cardiac Studies:   Lexiscan Myoview stress test 09/30/2012: No evidence of ischemia.  Normal   LVEF.  Echocardiogram 12/28/2018:  Left ventricle cavity is normal in size. Mild concentric hypertrophy of the left ventricle. Normal LV systolic function with EF 59%. Normal global wall motion. Doppler evidence of grade I (impaired) diastolic dysfunction, normal LAP.  Left atrial cavity is mildly dilated.  Mild tricuspid regurgitation. Estimated pulmonary artery systolic pressure is 26 mmHg.  IVC is dilated with respiratory variation. Estimated RA pressure 8 mmHg.  EKG: EKG 12/03/2019: Normal sinus rhythm at a rate of 61 bpm, left atrial enlargement.  Normal axis.  Poor R wave progression, cannot exclude anterior infarct old.  Compared to EKG 05/30/2019, PRWP is new.    Assessment     ICD-10-CM   1. Essential hypertension  I10 EKG 12-Lead  2. Agatston coronary artery calcium score between 200 and 399  R93.1   3. Mixed hyperlipidemia  E78.2   4. Class 2 obesity due to excess calories without serious comorbidity with body mass index (BMI) of 37.0 to 37.9 in adult  E66.09 buPROPion [facility] SR) 150  MG 12 hr tablet   Z68.37 phentermine 30 MG capsule  5. Other depression  F32.89 buPROPion (WELLBUTRIN SR) 150 MG 12 hr tablet    Meds ordered this encounter  Medications  . buPROPion (WELLBUTRIN SR) 150 MG 12 hr tablet    Sig: Take 1 tablet (150 mg total) by mouth 2 (two) times daily. Start with 1 pill once daily for the first 3 days. Then 1 pill twice daily.    Dispense:  30 tablet    Refill:  3  . phentermine 30 MG capsule    Sig: Take 1 capsule (30 mg total) by mouth every morning.    Dispense:  30 capsule    Refill:  3   Medications Discontinued During This  Encounter  Medication Reason  . traMADol (ULTRAM) 50 MG tablet Completed Course    Recommendations:   Jonathan Powers  is a 69 y.o. Caucasian male with history of idiopathic gout, hypertension, mild hypertriglyceridemia and metabolic syndrome with elevated blood sugar without diabetes, moderate obesity, obstructive sleep apnea on CPAP evaluted by me for hypertension and chest pain and has had a Coronary CTA on 11/19/2018 which revealed mild luminal irregularity and a calcium score of 250.  Patient presents today for 83-month follow-up, he remains stable.  Blood pressure is well controlled. Reviewed labs, LDL well controlled.  Will continue amlodipine, olmesartan/HCTZ, rosuvastatin.  As of 11/19/2018 triglycerides still elevated at 235, discussed with patient the importance of low calorie healthy diet and regular exercise routine.  Patient verbalized understanding and agreement.  Of note patient's activity/exercise is limited due to joint pain.  However he is able to hike each weekend, encouraged him to continue to do so.    Patient has also gained weight since last visit, discussed with patient risks and benefits of starting medication targeted at weight loss.  Patient expressed willingness to try both Wellbutrin 150 mg twice daily and phentermine 30 mg once daily.  Instructed patient to start taking 150 mg Wellbutrin once daily for 3 days and increase to 150 mg twice daily if tolerated. Discussed with patient that lifestyle modifications are also key to successful weight loss. He verbalized understanding. Suspect atypical chest pain to be related to underlying musculoskeletal etiology, do not suspect unstable angina.  Patient also expresses that he is struggling and does not enjoy work anymore.  He is currently working 2 days a week and is hoping to transition to full retirement in the next 2 months.  Patient seems to express some mild depressive feelings associated with this transition.  Wellbutrin  will likely benefit him in this respect as well.  Follow-up in 3 months for hypertension, weight management, elevated coronary calcium score.  Patient was seen in collaboration with Dr. Einar Gip. He also reviewed patient's chart and examined the patient. Dr. Einar Gip is in agreement of the plan.    Alethia Berthold, PA-C 12/03/2019, 6:45 PM Office: [phone]

## 2019-12-22 ENCOUNTER — Ambulatory Visit (HOSPITAL_COMMUNITY)
Admission: EM | Admit: 2019-12-22 | Discharge: 2019-12-22 | Disposition: A | Discharge status: Home / Self Care | Payer: Medicare Other | Attending: Family Medicine | Admitting: Family Medicine

## 2019-12-22 ENCOUNTER — Other Ambulatory Visit: Payer: Self-pay | Admitting: Rheumatology

## 2019-12-22 ENCOUNTER — Encounter: Payer: Self-pay | Admitting: Internal Medicine

## 2019-12-22 ENCOUNTER — Other Ambulatory Visit: Payer: Self-pay

## 2019-12-22 DIAGNOSIS — Z20822 Contact with and (suspected) exposure to covid-19: Secondary | ICD-10-CM | POA: Insufficient documentation

## 2019-12-22 DIAGNOSIS — R059 Cough, unspecified: Secondary | ICD-10-CM | POA: Diagnosis not present

## 2019-12-22 DIAGNOSIS — J069 Acute upper respiratory infection, unspecified: Secondary | ICD-10-CM | POA: Diagnosis not present

## 2019-12-22 DIAGNOSIS — R0602 Shortness of breath: Secondary | ICD-10-CM | POA: Diagnosis not present

## 2019-12-22 MED ORDER — PROMETHAZINE-DM 6.25-15 MG/5ML PO SYRP
5.0000 mL | ORAL_SOLUTION | Freq: Four times a day (QID) | ORAL | 0 refills | Status: DC | PRN
Start: 2019-12-22 — End: 2020-08-13

## 2019-12-22 MED ORDER — DOXYCYCLINE HYCLATE 100 MG PO CAPS
100.0000 mg | ORAL_CAPSULE | Freq: Two times a day (BID) | ORAL | 0 refills | Status: DC
Start: 2019-12-22 — End: 2019-12-24

## 2019-12-22 MED ORDER — PREDNISONE 10 MG PO TABS: ORAL_TABLET | ORAL | 0 refills | Status: DC

## 2019-12-22 NOTE — ED Triage Notes (Signed)
Pt c/o productive cough with green sputum, runny nose, congestion, sinus pressure, SOB, body aches for approx 2 days. Also c/o left chest pain through to scapula for past several days. Pt states "it's not my heart". Denies reproducible with palpation or movement.  Denies fever, n/v/d, dizziness, diaphoresis, general malaise.   Took Afrin this morning and tylenol.

## 2019-12-22 NOTE — Telephone Encounter (Signed)
Patient to verify dose of Allopurinol and call back to advise.

## 2019-12-23 LAB — SARS CORONAVIRUS 2 (TAT 6-24 HRS): SARS Coronavirus 2: NEGATIVE

## 2019-12-23 NOTE — ED Provider Notes (Signed)
[facility]    CSN: [id number] Arrival date & time: 12/22/19  1844      History   Chief Complaint Chief Complaint  Patient presents with  . Cough  . Shortness of Breath    HPI Jonathan Powers is a 69 y.o. male.   Patient presenting today with about 4 days of progressively worsening productive cough, chest and nasal congestion, SOB, CP. Denies known fever, wheezes, abdominal pain, N/V/D. Has been taking afrin, flonase, antihistamines so far without benefit. States he's very prone to bronchitis and typically requires abx to relieve sxs when this happens. Denies known sick contacts, recent travel. UTD on COVID vaccines and booster. No known pulmonary conditions.      Past Medical History:  Diagnosis Date  . ALLERGIC RHINITIS   . Cancer ([place])    Melonoma back  . CELLULITIS, LEG, RIGHT    Recurrent R hip cellulitis 1/08,3/08   . Diverticulitis   . GERD (gastroesophageal reflux disease)   . Gout   . Hashimoto's thyroiditis   . Hypertension   . Hypogonadism male    low T, a/w ED  . Hypothyroid    goiters   . Left thyroid nodule 2008   on [place], decrease size in 2011 [place]  . Migraines    Atypical and ocular  . OSTEOARTHRITIS   . Seasonal allergies   . Sleep apnea    cpap    Patient Active Problem List   Diagnosis Date Noted  . LRTI (lower respiratory tract infection) 12/24/2019  . Generalized abdominal tenderness without rebound tenderness 07/16/2019  . Failed total hip arthroplasty ([place]) 03/06/2019  . Intestinal infection due to Clostridioides difficile 11/28/2018  . Diarrhea of presumed infectious origin 11/26/2018  . Recurrent genital HSV (herpes simplex virus) infection 11/12/2018  . Other specified hypothyroidism 11/11/2018  . Insomnia 11/10/2018  . GERD (gastroesophageal reflux disease) 11/10/2018  . Seasonal allergies 11/10/2018  . Recurrent oral herpes simplex 09/23/2018  . Age-related osteoporosis with current pathological fracture 03/11/2018    . Flexural eczema 07/24/2017  . Routine general medical examination at a health care facility 10/12/2016  . Chronic right-sided low back pain without sciatica 07/21/2016  . Low testosterone 07/21/2016  . Idiopathic chronic gout of multiple sites without tophus 07/21/2016  . Cough 11/01/2015  . Superior mesenteric artery aneurysm ([name]) 07/12/2015  . Left thyroid nodule   . OSA (obstructive sleep apnea) 01/27/2013  . Hypothyroid   . Symptomatic PVCs 09/06/2012  . Dyslipidemia, goal LDL below 130 09/06/2012  . BPH (benign prostatic hyperplasia) 09/06/2012  . Essential hypertension 05/17/2006  . ALLERGIC RHINITIS 05/17/2006  . OSTEOARTHRITIS 05/17/2006    Past Surgical History:  Procedure Laterality Date  . CARPAL TUNNEL RELEASE Left 03/30/2017   Procedure: CARPAL TUNNEL RELEASE;  Surgeon: Garald Balding, MD;  Location: [facility];  Service: Orthopedics;  Laterality: Left;  . CHOLECYSTECTOMY  2004  . CTR Bilateral 1982  . CYSTOSCOPY  1974  . FOOT FUSION Left 06/22/2017  . HEMORRHOID SURGERY N/A 03/30/2017   Procedure: HEMORRHOIDECTOMY;  Surgeon: Coralie Keens, MD;  Location: [facility];  Service: General;  Laterality: N/A;  . open reduction L little finger Left 1970  . Repair digital thumb, left Left 1990  . Right shoulder cuff repair Right 09/15/11  . Right shoulder SAD, DCR Right 02/05/11  . TOTAL HIP ARTHROPLASTY Left 07/26/06  . TOTAL HIP ARTHROPLASTY Right 1999  . TOTAL HIP REVISION Left 03/07/2019   Procedure: LEFT  TOTAL HIP REVISION POSTERIOR APPROACH;  Surgeon: Frederik Pear, MD;  Location[facility];  Service: Orthopedics;  Laterality: Left;       Home Medications    Prior to Admission medications   Medication Sig Start Date End Date Taking? Authorizing Provider  acetaminophen (TYLENOL) 500 MG tablet Take 1,000 mg by mouth every 6 (six) hours as needed for moderate pain.    Yes [provider]  amLODipine (NORVASC) 2.5 MG tablet  TAKE ONE TABLET BY MOUTH ONE TIME DAILY  06/03/19  Yes Adrian Prows, MD  cetirizine (ZYRTEC) 10 MG tablet Take 10 mg by mouth daily.   Yes [provider]  levothyroxine (SYNTHROID) 175 MCG tablet TAKE 1 TABLET BY MOUTH DAILY BEFORE BREAKFAST 10/10/19  Yes [name], MD  olmesartan-hydrochlorothiazide (BENICAR HCT) 40-25 MG tablet TAKE 1 TABLET BY MOUTH EVERY DAY 08/26/19  Yes Adrian Prows, MD  rosuvastatin (CRESTOR) 5 MG tablet TAKE ONE TABLET BY MOUTH DAILY AT BEDTIME 09/08/19  Yes [name], MD  traZODone (DESYREL) 100 MG tablet Take 1 tablet (100 mg total) by mouth at bedtime as needed for sleep. 11/27/19  Yes [name], MD  allopurinol (ZYLOPRIM) 100 MG tablet Take 1 tablet p.o. daily for 1 week, increase 2 tabs p.o. daily for the next week and then increase to 3 tabs daily. 12/01/19   Bo Merino, MD  buPROPion (WELLBUTRIN SR) 150 MG 12 hr tablet TAKE 1 TABLET (150 MG TOTAL) BY MOUTH 2 (TWO) TIMES DAILY. START WITH 1 PILL ONCE DAILY FOR THE FIRST 3 DAYS. THEN 1 PILL TWICE DAILY. 12/25/19   Cantwell, Celeste C, PA-C  cefdinir (OMNICEF) 300 MG capsule Take 1 capsule (300 mg total) by mouth 2 (two) times daily. 12/24/19   [name], MD  colchicine 0.6 MG tablet Take 1 tablet (0.6 mg total) by mouth daily. 12/01/19   Bo Merino, MD  diclofenac sodium (VOLTAREN) 1 % GEL 2-4 grams qid as needed for pain as discussed in office Patient taking differently: Apply 2 g topically 4 (four) times daily as needed (pain).  07/22/18   Ofilia Neas, PA-C  phentermine 30 MG capsule Take 1 capsule (30 mg total) by mouth every morning. 12/03/19   Cantwell, Celeste C, PA-C  predniSONE (DELTASONE) 10 MG tablet Take 6 tabs day one, 5 tabs day two, 4 tabs day three, etc 12/22/19   Volney American, PA-C  promethazine-dextromethorphan (PROMETHAZINE-DM) 6.25-15 MG/5ML syrup Take 5 mLs by mouth 4 (four) times daily as needed for cough. 12/22/19   Volney American, PA-C  tadalafil  (CIALIS) 5 MG tablet Take 5 mg by mouth at bedtime.    [provider]  Testosterone 1.62 % GEL Apply 3 Pump topically daily. 10/02/18   [provider]  valACYclovir (VALTREX) 1000 MG tablet Take 1 tablet (1,000 mg total) by mouth 2 (two) times daily. 11/27/19   [name], MD  apixaban (ELIQUIS) 2.5 MG TABS tablet Take 1 tablet (2.5 mg total) by mouth 2 (two) times daily. 03/07/19 05/16/19  Leighton Parody, PA-C  telmisartan (MICARDIS) 40 MG tablet TAKE ONE TABLET BY MOUTH ONE TIME DAILY  02/28/19   [name], MD    Family History Family History  Problem Relation Age of Onset  . Arthritis Father   . Heart disease Father   . Diabetes Father   . Vascular Disease Father        AoBifem  . Arthritis Mother   .  Diabetes Mother   . Multiple sclerosis Mother   . Cancer Paternal Grandmother        "GI"  . Cancer Paternal Grandfather        stomach cancer  . Thyroid cancer Sister   . Uterine cancer Sister     Social History Social History   Tobacco Use  . Smoking status: Never Smoker  . Smokeless tobacco: Never Used  Vaping Use  . Vaping Use: Never used  Substance Use Topics  . Alcohol use: Yes    Alcohol/week: 0.0 standard drinks    Comment: rare  . Drug use: No     Allergies   Nsaids, Tolmetin, and Penicillins   Review of Systems Review of Systems PER HPI   Physical Exam Triage Vital Signs ED Triage Vitals  Enc Vitals Group     BP 12/22/19 1958 128/75     Pulse Rate 12/22/19 1958 66     Resp 12/22/19 1958 18     Temp 12/22/19 1958 99.4 F (37.4 C)     Temp Source 12/22/19 1958 Oral     SpO2 12/22/19 1958 96 %     Weight --      Height --      Head Circumference --      Peak Flow --      Pain Score 12/22/19 2035 0     Pain Loc --      Pain Edu? --      Excl. in [place]? --    No data found.  Updated Vital Signs BP 128/75 (BP Location: Left Arm)   Pulse 66   Temp 99.4 F (37.4 C) (Oral)   Resp 18   SpO2 96%   Visual  Acuity Right Eye Distance:   Left Eye Distance:   Bilateral Distance:    Right Eye Near:   Left Eye Near:    Bilateral Near:     Physical Exam Vitals and nursing note reviewed.  Constitutional:      Appearance: Normal appearance.  HENT:     Head: Atraumatic.     Right Ear: Tympanic membrane normal.     Left Ear: Tympanic membrane normal.     Nose: Rhinorrhea present.     Mouth/Throat:     Mouth: Mucous membranes are moist.     Pharynx: Posterior oropharyngeal erythema present.  Eyes:     Extraocular Movements: Extraocular movements intact.     Conjunctiva/sclera: Conjunctivae normal.  Cardiovascular:     Rate and Rhythm: Normal rate and regular rhythm.  Pulmonary:     Effort: Pulmonary effort is normal. No respiratory distress.     Breath sounds: Normal breath sounds. No wheezing.  Abdominal:     General: Bowel sounds are normal. There is no distension.     Palpations: Abdomen is soft.     Tenderness: There is no abdominal tenderness. There is no guarding.  Musculoskeletal:        General: Normal range of motion.     Cervical back: Normal range of motion and neck supple.  Skin:    General: Skin is warm and dry.  Neurological:     General: No focal deficit present.     Mental Status: He is oriented to person, place, and time.  Psychiatric:        Mood and Affect: Mood normal.        Thought Content: Thought content normal.        Judgment: Judgment normal.  UC Treatments / Results  Labs (all labs ordered are listed, but only abnormal results are displayed) Labs Reviewed  SARS CORONAVIRUS 2 (TAT 6-24 HRS)    EKG   Radiology DG Chest 2 View  Result Date: 12/24/2019 CLINICAL DATA:  Cough and congestion for 3 days. EXAM: CHEST - 2 VIEW COMPARISON:  Radiograph 08/05/2019. FINDINGS: The cardiomediastinal contours are normal. The lungs are clear. Pulmonary vasculature is normal. No consolidation, pleural effusion, or pneumothorax. Thoracic spondylosis. No  acute osseous abnormalities are seen. IMPRESSION: No acute chest findings. Electronically Signed   By: Keith Rake M.D.   On: 12/24/2019 15:03    Procedures Procedures (including critical care time)  Medications Ordered in UC Medications - No data to display  Initial Impression / Assessment and Plan / UC Course  I have reviewed the triage vital signs and the nursing notes.  Pertinent labs & imaging results that were available during my care of the patient were reviewed by me and considered in my medical decision making (see chart for details).     Exam, vital signs very reassuring today. No evidence of bacterial infection on exam today and discussed trial of prednisone, phenergan DM to relieve bronchitis. Will send doxycycline for if sxs worsening after another 5 days or so but suspect the prednisone will improve his sxs. Will also work toward d/c of home afrin given potential for worsening congestion/rebound sxs. Continue allergy regimen regularly. COVID test pending, discussed isolation protocol at length. Return precautions reviewed with patient who is agreeable to plan. He does decline cardiac workup with regard to his CP today as he feels it's related to his bronchitis only.  Final Clinical Impressions(s) / UC Diagnoses   Final diagnoses:  Viral URI with cough   Discharge Instructions   None    ED Prescriptions    Medication Sig Dispense Auth. Provider   doxycycline (VIBRAMYCIN) 100 MG capsule Take 1 capsule (100 mg total) by mouth 2 (two) times daily. Start this medication ONLY if your other medications are not helping in the next 3-4 days 14 capsule Volney American, PA-C   predniSONE (DELTASONE) 10 MG tablet Take 6 tabs day one, 5 tabs day two, 4 tabs day three, etc 21 tablet Volney American, PA-C   promethazine-dextromethorphan (PROMETHAZINE-DM) 6.25-15 MG/5ML syrup Take 5 mLs by mouth 4 (four) times daily as needed for cough. 50 mL Volney American,  [place]     PDMP not reviewed this encounter.   Volney American, [place] 12/25/19 2206

## 2019-12-23 NOTE — Telephone Encounter (Signed)
Please call patient to verify the dose of allopurinol.  If he already started allopurinol he may be on a different dose than the starting dose.

## 2019-12-24 ENCOUNTER — Encounter: Payer: Self-pay | Admitting: Internal Medicine

## 2019-12-24 ENCOUNTER — Ambulatory Visit (INDEPENDENT_AMBULATORY_CARE_PROVIDER_SITE_OTHER): Payer: Medicare Other | Admitting: Internal Medicine

## 2019-12-24 ENCOUNTER — Ambulatory Visit (INDEPENDENT_AMBULATORY_CARE_PROVIDER_SITE_OTHER): Payer: Medicare Other

## 2019-12-24 ENCOUNTER — Other Ambulatory Visit: Payer: Self-pay

## 2019-12-24 VITALS — BP 126/78 | HR 72 | Temp 98.7°F | Resp 16 | Ht 69.0 in | Wt 275.0 lb

## 2019-12-24 DIAGNOSIS — J22 Unspecified acute lower respiratory infection: Secondary | ICD-10-CM | POA: Diagnosis not present

## 2019-12-24 DIAGNOSIS — Z23 Encounter for immunization: Secondary | ICD-10-CM | POA: Diagnosis not present

## 2019-12-24 DIAGNOSIS — R059 Cough, unspecified: Secondary | ICD-10-CM

## 2019-12-24 MED ORDER — CEFDINIR 300 MG PO CAPS: 300.0000 mg | ORAL_CAPSULE | Freq: Two times a day (BID) | ORAL | 0 refills | Status: DC

## 2019-12-24 NOTE — Progress Notes (Signed)
Subjective:  Patient ID: Jonathan Powers, male    DOB: Jun 21, 1950  Age: 69 y.o. MRN: [id number]  CC: URI   This visit occurred during the SARS-CoV-2 public health emergency.  Safety protocols were in place, including screening questions prior to the visit, additional usage of staff PPE, and extensive cleaning of exam room while observing appropriate contact time as indicated for disinfecting solutions.    HPI Jonathan Powers presents for a 5-day history of worsening cough that is productive of yellow, green phlegm with shortness of breath.  He was seen at an urgent care center 2 days ago and steroids and doxycycline were prescribed but he is not taking either 1 of those.  A chest x-ray was not completed.  He also complains of nasal congestion, facial pain, and earaches.  He denies wheezing, fever, chills, night sweats, or hemoptysis.  Outpatient Medications Prior to Visit  Medication Sig Dispense Refill  . acetaminophen (TYLENOL) 500 MG tablet Take 1,000 mg by mouth every 6 (six) hours as needed for moderate pain.     [name] allopurinol (ZYLOPRIM) 100 MG tablet Take 1 tablet p.o. daily for 1 week, increase 2 tabs p.o. daily for the next week and then increase to 3 tabs daily. 63 tablet 0  . amLODipine (NORVASC) 2.5 MG tablet TAKE ONE TABLET BY MOUTH ONE TIME DAILY  90 tablet 2  . buPROPion (WELLBUTRIN SR) 150 MG 12 hr tablet Take 1 tablet (150 mg total) by mouth 2 (two) times daily. Start with 1 pill once daily for the first 3 days. Then 1 pill twice daily. 30 tablet 3  . cetirizine (ZYRTEC) 10 MG tablet Take 10 mg by mouth daily.    . colchicine 0.6 MG tablet Take 1 tablet (0.6 mg total) by mouth daily. 30 tablet 2  . diclofenac sodium (VOLTAREN) 1 % GEL 2-4 grams qid as needed for pain as discussed in office (Patient taking differently: Apply 2 g topically 4 (four) times daily as needed (pain). ) 10 Tube 12  . levothyroxine (SYNTHROID) 175 MCG tablet TAKE 1 TABLET BY MOUTH DAILY BEFORE  BREAKFAST 90 tablet 0  . olmesartan-hydrochlorothiazide (BENICAR HCT) 40-25 MG tablet TAKE 1 TABLET BY MOUTH EVERY DAY 90 tablet 1  . phentermine 30 MG capsule Take 1 capsule (30 mg total) by mouth every morning. 30 capsule 3  . predniSONE (DELTASONE) 10 MG tablet Take 6 tabs day one, 5 tabs day two, 4 tabs day three, etc 21 tablet 0  . promethazine-dextromethorphan (PROMETHAZINE-DM) 6.25-15 MG/5ML syrup Take 5 mLs by mouth 4 (four) times daily as needed for cough. 50 mL 0  . rosuvastatin (CRESTOR) 5 MG tablet TAKE ONE TABLET BY MOUTH DAILY AT BEDTIME 90 tablet 1  . tadalafil (CIALIS) 5 MG tablet Take 5 mg by mouth at bedtime.    . Testosterone 1.62 % GEL Apply 3 Pump topically daily.    . traZODone (DESYREL) 100 MG tablet Take 1 tablet (100 mg total) by mouth at bedtime as needed for sleep. 90 tablet 1  . valACYclovir (VALTREX) 1000 MG tablet Take 1 tablet (1,000 mg total) by mouth 2 (two) times daily. 14 tablet 5  . doxycycline (VIBRAMYCIN) 100 MG capsule Take 1 capsule (100 mg total) by mouth 2 (two) times daily. Start this medication ONLY if your other medications are not helping in the next 3-4 days 14 capsule 0   No facility-administered medications prior to visit.    ROS Review of Systems  Constitutional: Negative  for chills, fatigue and fever.  HENT: Positive for sinus pressure. Negative for ear pain, sore throat and trouble swallowing.   Eyes: Negative.   Respiratory: Positive for cough and shortness of breath. Negative for chest tightness and wheezing.   Cardiovascular: Negative for chest pain, palpitations and leg swelling.  Gastrointestinal: Negative for abdominal pain, constipation, diarrhea, nausea and vomiting.  Endocrine: Negative.   Genitourinary: Negative.  Negative for dysuria.  Musculoskeletal: Negative for arthralgias and myalgias.  Skin: Negative.  Negative for color change and pallor.  Neurological: Negative.  Negative for dizziness, weakness and light-headedness.   Hematological: Negative for adenopathy. Does not bruise/bleed easily.  Psychiatric/Behavioral: Negative.     Objective:  BP 126/78   Pulse 72   Temp 98.7 F (37.1 C) (Oral)   Resp 16   Ht 5\' 9"  (1.753 m)   Wt 275 lb (124.7 kg)   SpO2 98%   BMI 40.61 kg/m   BP Readings from Last 3 Encounters:  12/24/19 126/78  12/22/19 128/75  12/03/19 (!) 125/58    Wt Readings from Last 3 Encounters:  12/24/19 275 lb (124.7 kg)  12/03/19 280 lb (127 kg)  11/28/19 275 lb (124.7 kg)    Physical Exam Vitals reviewed.  Constitutional:      Appearance: He is not ill-appearing.  HENT:     Right Ear: Tympanic membrane normal.     Left Ear: Tympanic membrane normal.     Nose: Mucosal edema present. No congestion or rhinorrhea.     Left Nostril: No epistaxis.     Right Sinus: No maxillary sinus tenderness.     Left Sinus: No maxillary sinus tenderness.     Mouth/Throat:     Mouth: Mucous membranes are moist.     Pharynx: No posterior oropharyngeal erythema.  Eyes:     General: No scleral icterus.    Conjunctiva/sclera: Conjunctivae normal.  Cardiovascular:     Rate and Rhythm: Normal rate and regular rhythm.     Heart sounds: No murmur heard.   Pulmonary:     Effort: Pulmonary effort is normal.     Breath sounds: No stridor. No wheezing, rhonchi or rales.  Abdominal:     General: Abdomen is flat.     Palpations: There is no mass.     Tenderness: There is no abdominal tenderness. There is no guarding.  Musculoskeletal:        General: Normal range of motion.     Cervical back: Neck supple.     Right lower leg: No edema.     Left lower leg: No edema.  Lymphadenopathy:     Cervical: No cervical adenopathy.  Skin:    General: Skin is warm and dry.     Coloration: Skin is not pale.  Neurological:     General: No focal deficit present.     Mental Status: He is alert.  Psychiatric:        Mood and Affect: Mood normal.        Behavior: Behavior normal.     Lab Results   Component Value Date   WBC 7.8 11/28/2019   HGB 14.8 11/28/2019   HCT 43.0 11/28/2019   PLT 328 11/28/2019   GLUCOSE 122 (H) 11/28/2019   CHOL 108 11/19/2018   TRIG 235 (H) 11/19/2018   HDL 39 (L) 11/19/2018   LDLCALC 40 11/19/2018   ALT 17 11/28/2019   AST 21 11/28/2019   NA 139 11/28/2019   K 3.9 11/28/2019   CL  103 11/28/2019   CREATININE 0.86 11/28/2019   BUN 14 11/28/2019   CO2 28 11/28/2019   TSH 1.26 07/16/2019   PSA 0.5 11/19/2018   INR 1.0 03/05/2019   HGBA1C 5.1 11/09/2018    DG Chest 2 View  Result Date: 12/24/2019 CLINICAL DATA:  Cough and congestion for 3 days. EXAM: CHEST - 2 VIEW COMPARISON:  Radiograph 08/05/2019. FINDINGS: The cardiomediastinal contours are normal. The lungs are clear. Pulmonary vasculature is normal. No consolidation, pleural effusion, or pneumothorax. Thoracic spondylosis. No acute osseous abnormalities are seen. IMPRESSION: No acute chest findings. Electronically Signed   By: Keith Rake M.D.   On: 12/24/2019 15:03    Assessment & Plan:   Lyle was seen today for uri.  Diagnoses and all orders for this visit:  Flu vaccine need -     Flu Vaccine QUAD High Dose(Fluad)  Cough- His chest x-ray is negative for infiltrate.  Will treat for bacterial lower respiratory tract infection with a third-generation cephalosporin. -     DG Chest 2 View; Future  LRTI (lower respiratory tract infection) -     cefdinir (OMNICEF) 300 MG capsule; Take 1 capsule (300 mg total) by mouth 2 (two) times daily.   I have discontinued Lateef Juncaj. Schmall's doxycycline. I am also having him start on cefdinir. Additionally, I am having him maintain his cetirizine, acetaminophen, diclofenac sodium, tadalafil, Testosterone, amLODipine, olmesartan-hydrochlorothiazide, rosuvastatin, levothyroxine, traZODone, valACYclovir, allopurinol, colchicine, buPROPion, phentermine, predniSONE, and promethazine-dextromethorphan.  Meds ordered this encounter  Medications  .  cefdinir (OMNICEF) 300 MG capsule    Sig: Take 1 capsule (300 mg total) by mouth 2 (two) times daily.    Dispense:  20 capsule    Refill:  0     Follow-up: Return in about 3 weeks (around 01/14/2020).  Scarlette Calico, MD

## 2019-12-24 NOTE — Patient Instructions (Signed)

## 2019-12-25 ENCOUNTER — Other Ambulatory Visit: Payer: Self-pay | Admitting: Student

## 2019-12-25 ENCOUNTER — Encounter: Payer: Self-pay | Admitting: Internal Medicine

## 2019-12-25 DIAGNOSIS — F3289 Other specified depressive episodes: Secondary | ICD-10-CM

## 2019-12-25 DIAGNOSIS — E6609 Other obesity due to excess calories: Secondary | ICD-10-CM

## 2020-01-16 ENCOUNTER — Other Ambulatory Visit: Payer: Self-pay | Admitting: Rheumatology

## 2020-01-19 ENCOUNTER — Encounter: Payer: Self-pay | Admitting: Internal Medicine

## 2020-01-19 ENCOUNTER — Other Ambulatory Visit: Payer: Self-pay | Admitting: Internal Medicine

## 2020-01-19 DIAGNOSIS — E039 Hypothyroidism, unspecified: Secondary | ICD-10-CM

## 2020-01-20 ENCOUNTER — Other Ambulatory Visit: Payer: Self-pay | Admitting: Cardiology

## 2020-01-20 ENCOUNTER — Other Ambulatory Visit: Payer: Self-pay

## 2020-01-20 ENCOUNTER — Other Ambulatory Visit: Payer: Self-pay | Admitting: Internal Medicine

## 2020-01-20 ENCOUNTER — Other Ambulatory Visit (INDEPENDENT_AMBULATORY_CARE_PROVIDER_SITE_OTHER): Payer: Medicare Other

## 2020-01-20 ENCOUNTER — Other Ambulatory Visit: Payer: Self-pay | Admitting: Rheumatology

## 2020-01-20 DIAGNOSIS — E039 Hypothyroidism, unspecified: Secondary | ICD-10-CM

## 2020-01-20 DIAGNOSIS — I10 Essential (primary) hypertension: Secondary | ICD-10-CM

## 2020-01-20 LAB — TSH: TSH: 3.31 u[IU]/mL (ref 0.35–4.50)

## 2020-01-20 MED ORDER — LEVOTHYROXINE SODIUM 175 MCG PO TABS: ORAL_TABLET | ORAL | 1 refills | Status: DC

## 2020-01-20 NOTE — Telephone Encounter (Signed)
Last Visit: 11/28/2019 Next Visit: 03/04/2020 Labs: 11/28/2019 CBC is normal, CMP is normal except glucose is mildly elevated as the sample was not fasting. Uric acid is 9.0  Current Dose per lab note on 12/01/2019: colchicine 0.6 mg p.o. daily Dx: Idiopathic chronic gout of multiple sites without tophus   Okay to refill per Dr. Estanislado Pandy

## 2020-01-26 DIAGNOSIS — H40013 Open angle with borderline findings, low risk, bilateral: Secondary | ICD-10-CM | POA: Diagnosis not present

## 2020-01-26 DIAGNOSIS — H35033 Hypertensive retinopathy, bilateral: Secondary | ICD-10-CM | POA: Diagnosis not present

## 2020-01-26 DIAGNOSIS — H04123 Dry eye syndrome of bilateral lacrimal glands: Secondary | ICD-10-CM | POA: Diagnosis not present

## 2020-01-26 DIAGNOSIS — H0102B Squamous blepharitis left eye, upper and lower eyelids: Secondary | ICD-10-CM | POA: Diagnosis not present

## 2020-01-26 DIAGNOSIS — H2513 Age-related nuclear cataract, bilateral: Secondary | ICD-10-CM | POA: Diagnosis not present

## 2020-01-26 DIAGNOSIS — H43393 Other vitreous opacities, bilateral: Secondary | ICD-10-CM | POA: Diagnosis not present

## 2020-01-26 DIAGNOSIS — H0102A Squamous blepharitis right eye, upper and lower eyelids: Secondary | ICD-10-CM | POA: Diagnosis not present

## 2020-02-12 ENCOUNTER — Encounter: Payer: Self-pay | Admitting: Internal Medicine

## 2020-02-12 ENCOUNTER — Other Ambulatory Visit: Payer: Self-pay | Admitting: Internal Medicine

## 2020-02-12 DIAGNOSIS — J0100 Acute maxillary sinusitis, unspecified: Secondary | ICD-10-CM

## 2020-02-12 MED ORDER — CEFDINIR 300 MG PO CAPS: 300.0000 mg | ORAL_CAPSULE | Freq: Two times a day (BID) | ORAL | 0 refills | Status: AC

## 2020-03-01 DIAGNOSIS — E291 Testicular hypofunction: Secondary | ICD-10-CM | POA: Diagnosis not present

## 2020-03-03 DIAGNOSIS — S62635B Displaced fracture of distal phalanx of left ring finger, initial encounter for open fracture: Secondary | ICD-10-CM | POA: Diagnosis not present

## 2020-03-04 ENCOUNTER — Other Ambulatory Visit: Payer: Self-pay | Admitting: Internal Medicine

## 2020-03-04 ENCOUNTER — Other Ambulatory Visit: Payer: Self-pay

## 2020-03-04 ENCOUNTER — Ambulatory Visit: Payer: Medicare Other | Admitting: Cardiology

## 2020-03-04 ENCOUNTER — Encounter: Payer: Self-pay | Admitting: Cardiology

## 2020-03-04 VITALS — BP 102/51 | HR 66 | Temp 97.2°F | Resp 16 | Ht 69.0 in | Wt 275.0 lb

## 2020-03-04 DIAGNOSIS — Z6837 Body mass index (BMI) 37.0-37.9, adult: Secondary | ICD-10-CM | POA: Diagnosis not present

## 2020-03-04 DIAGNOSIS — I10 Essential (primary) hypertension: Secondary | ICD-10-CM | POA: Diagnosis not present

## 2020-03-04 DIAGNOSIS — Z6841 Body Mass Index (BMI) 40.0 and over, adult: Secondary | ICD-10-CM | POA: Diagnosis not present

## 2020-03-04 DIAGNOSIS — E6609 Other obesity due to excess calories: Secondary | ICD-10-CM

## 2020-03-04 DIAGNOSIS — R0989 Other specified symptoms and signs involving the circulatory and respiratory systems: Secondary | ICD-10-CM | POA: Diagnosis not present

## 2020-03-04 DIAGNOSIS — E785 Hyperlipidemia, unspecified: Secondary | ICD-10-CM

## 2020-03-04 NOTE — Progress Notes (Signed)
Primary Physician/Referring:  [name], MD  Referring Bo Merino, MD Patient ID: Jonathan Powers, male    DOB: July 25, 1950, 70 y.o.   MRN: [id number]  Chief Complaint  Patient presents with  . Hypertension   HPI:    Jonathan Powers  is a 70 y.o. Caucasian male with history of idiopathic gout, hypertension, mild hypertriglyceridemia and metabolic syndrome with elevated blood sugar without diabetes, moderate obesity, obstructive sleep apnea on CPAP evaluted by me for hypertension and chest pain and has had a Coronary CTA on 11/19/2018 which revealed mild luminal irregularity and a calcium score of 250.  Presents for 6 month follow up of obesity and hypertension.  He is tolerating all his medications for hypertension and hyperlipidemia but could not tolerate Wellbutrin which had started 3 months ago along with a combination of phentermine.  He has not had any additional weight loss and took the medication only for about a week.  Otherwise no specific symptoms.  He has injured his left fourth and fifth digit while doing some woodwork last evening.  Past Medical History:  Diagnosis Date  . ALLERGIC RHINITIS   . Cancer ([place])    Melonoma back  . CELLULITIS, LEG, RIGHT    Recurrent R hip cellulitis 1/08,3/08   . Diverticulitis   . GERD (gastroesophageal reflux disease)   . Gout   . Hashimoto's thyroiditis   . Hypertension   . Hypogonadism male    low T, a/w ED  . Hypothyroid    goiters   . Left thyroid nodule 2008   on [place], decrease size in 2011 [place]  . Migraines    Atypical and ocular  . OSTEOARTHRITIS   . Seasonal allergies   . Sleep apnea    cpap   Past Surgical History:  Procedure Laterality Date  . CARPAL TUNNEL RELEASE Left 03/30/2017   Procedure: CARPAL TUNNEL RELEASE;  Surgeon: Garald Balding, MD;  Location: [facility];  Service: Orthopedics;  Laterality: Left;  . CHOLECYSTECTOMY  2004  . CTR Bilateral 1982  . CYSTOSCOPY  1974  . FOOT FUSION  Left 06/22/2017  . HEMORRHOID SURGERY N/A 03/30/2017   Procedure: HEMORRHOIDECTOMY;  Surgeon: Coralie Keens, MD;  Location: [facility];  Service: General;  Laterality: N/A;  . open reduction L little finger Left 1970  . Repair digital thumb, left Left 1990  . Right shoulder cuff repair Right 09/15/11  . Right shoulder SAD, DCR Right 02/05/11  . TOTAL HIP ARTHROPLASTY Left 07/26/06  . TOTAL HIP ARTHROPLASTY Right 1999  . TOTAL HIP REVISION Left 03/07/2019   Procedure: LEFT TOTAL HIP REVISION POSTERIOR APPROACH;  Surgeon: Frederik Pear, MD;  Location: [facility];  Service: Orthopedics;  Laterality: Left;   Social History   Tobacco Use  . Smoking status: Never Smoker  . Smokeless tobacco: Never Used  Substance Use Topics  . Alcohol use: Yes    Alcohol/week: 0.0 standard drinks    Comment: rare   Marital Status: Married   ROS  Review of Systems  Constitutional: Negative for weight gain.  Cardiovascular: Positive for dyspnea on exertion (stable). Negative for chest pain and leg swelling.  Musculoskeletal: Positive for arthritis, back pain, joint pain (neck and left shoulder) and neck pain.  Gastrointestinal: Negative for melena.   Objective  Blood pressure (!) 102/51, pulse 66, temperature (!) 97.2 F (36.2 C), temperature source Temporal, resp. rate 16, height 5\' 9"  (1.753 m), weight 275 lb (124.7 kg), SpO2 96 %.  Body mass index is 40.61 kg/m.  Vitals with BMI 03/04/2020 12/24/2019 12/22/2019  Height 5\' 9"  5\' 9"  -  Weight 275 lbs 275 lbs -  BMI 16.10 96.04 -  Systolic 540 981 191  Diastolic 51 78 75  Pulse 66 72 66  Some encounter information is confidential and restricted. Go to Review Flowsheets activity to see all data.     Physical Exam Constitutional:      General: He is not in acute distress.    Appearance: He is well-developed.     Comments: Moderately obese  Neck:     Comments: Short neck and difficult to evaluate JVP Cardiovascular:     Rate and Rhythm:  Normal rate and regular rhythm.     Pulses:          Carotid pulses are 2+ on the right side and 2+ on the left side.      Radial pulses are 2+ on the right side and 2+ on the left side.       Dorsalis pedis pulses are 2+ on the right side and 2+ on the left side.       Posterior tibial pulses are 2+ on the right side and 2+ on the left side.     Heart sounds: Normal heart sounds. No murmur heard. No gallop.      Comments: 1+ bilateral pitting leg edema, left > right.  No JVD. Pulmonary:     Effort: Pulmonary effort is normal.     Breath sounds: Normal breath sounds.  Abdominal:     General: Bowel sounds are normal.     Palpations: Abdomen is soft.     Comments: Obese.     Laboratory examination:   Recent Labs    03/08/19 0503 05/15/19 2036 07/16/19 1534 11/28/19 1357  NA 134* 142 137 139  K 4.7 4.2 3.7 3.9  CL 101 105 102 103  CO2 25 28 31 28   GLUCOSE 156* 105* 102* 122*  BUN 16 9 13 14   CREATININE 1.18 1.29* 0.94 0.86  CALCIUM 8.4* 9.2 9.4 9.5  GFRNONAA >60 57*  --  89  GFRAA >60 >60  --  103   CMP Latest Ref Rng & Units 11/28/2019 07/16/2019 05/15/2019  Glucose 65 - 99 mg/dL 122(H) 102(H) 105(H)  BUN 7 - 25 mg/dL 14 13 9   Creatinine 0.70 - 1.25 mg/dL 0.86 0.94 1.29(H)  Sodium 135 - 146 mmol/L 139 137 142  Potassium 3.5 - 5.3 mmol/L 3.9 3.7 4.2  Chloride 98 - 110 mmol/L 103 102 105  CO2 20 - 32 mmol/L 28 31 28   Calcium 8.6 - 10.3 mg/dL 9.5 9.4 9.2  Total Protein 6.1 - 8.1 g/dL 6.7 6.9 -  Total Bilirubin 0.2 - 1.2 mg/dL 0.6 0.5 -  Alkaline Phos 39 - 117 U/L - 55 -  AST 10 - 35 U/L 21 22 -  ALT 9 - 46 U/L 17 18 -   CBC Latest Ref Rng & Units 11/28/2019 07/16/2019 05/15/2019  WBC 3.8 - 10.8 Thousand/uL 7.8 7.4 7.6  Hemoglobin 13.2 - 17.1 g/dL 14.8 14.9 14.9  Hematocrit 38.5 - 50.0 % 43.0 42.8 44.7  Platelets 140 - 400 Thousand/uL 328 282.0 259    Lipid Panel     Component Value Date/Time   CHOL 108 11/19/2018 0931   TRIG 235 (H) 11/19/2018 0931   HDL 39 (L)  11/19/2018 0931   CHOLHDL 2.8 11/19/2018 0931   VLDL 35.6 02/05/2018 1629   LDLCALC  40 11/19/2018 0931   HEMOGLOBIN A1C Lab Results  Component Value Date   HGBA1C 5.1 11/09/2018   MPG 99.67 11/09/2018   TSH Recent Labs    07/16/19 1534 01/20/20 1145  TSH 1.26 3.31   Medications   Current Outpatient Medications  Medication Instructions  . acetaminophen (TYLENOL) 1,000 mg, Oral, Every 6 hours PRN  . cetirizine (ZYRTEC) 10 mg, Daily  . colchicine 0.6 MG tablet TAKE 1 TABLET BY MOUTH EVERY DAY  . diclofenac sodium (VOLTAREN) 1 % GEL 2-4 grams qid as needed for pain as discussed in office  . levothyroxine (SYNTHROID) 175 MCG tablet TAKE 1 TABLET BY MOUTH DAILY BEFORE BREAKFAST  . olmesartan-hydrochlorothiazide (BENICAR HCT) 40-25 MG tablet TAKE 1 TABLET BY MOUTH EVERY DAY  . phentermine 30 mg, Oral, BH-each morning  . promethazine-dextromethorphan (PROMETHAZINE-DM) 6.25-15 MG/5ML syrup 5 mLs, Oral, 4 times daily PRN  . rosuvastatin (CRESTOR) 5 MG tablet TAKE ONE TABLET BY MOUTH ONE TIME DAILY AT BEDTIME  . tadalafil (CIALIS) 5 mg, Oral, Daily at bedtime  . Testosterone 1.62 % GEL 3 Pump, Topical, Daily  . traZODone (DESYREL) 100 mg, Oral, At bedtime PRN  . valACYclovir (VALTREX) 1,000 mg, Oral, 2 times daily   Radiology:   Coronary CTA 11/11/2018:  1. Minimal non-obstructive CAD, CADRADS = 0. 2. Coronary calcium score of 250.78. This was 66th percentile for age and sex matched control. 3. Normal coronary origin with right dominance. 4. Atherosclerotic calcification of the aorta.  Chest x-ray 05/15/2019: Mild right basilar linear atelectasis without active cardiopulmonary disease.  Chest x-ray 08/05/2019:  Poor inspiration. Possible mild right base atelectasis. No active disease suspected otherwise.  Cardiac Studies:   Lexiscan Myoview stress test 09/30/2012: No evidence of ischemia.  Normal   LVEF.  Echocardiogram 12/28/2018:  Left ventricle cavity is normal in size.  Mild concentric hypertrophy of the left ventricle. Normal LV systolic function with EF 59%. Normal global wall motion. Doppler evidence of grade I (impaired) diastolic dysfunction, normal LAP.  Left atrial cavity is mildly dilated.  Mild tricuspid regurgitation. Estimated pulmonary artery systolic pressure is 26 mmHg.  IVC is dilated with respiratory variation. Estimated RA pressure 8 mmHg.  EKG: EKG 12/03/2019: Normal sinus rhythm at a rate of 61 bpm, left atrial enlargement.  Normal axis.  Poor R wave progression, cannot exclude anterior infarct old.  Compared to EKG 05/30/2019, PRWP is new.    Assessment     ICD-10-CM   1. Essential hypertension  I10   2. Right carotid bruit  R09.89 PCV CAROTID DUPLEX (BILATERAL)  3. Class 3 severe obesity due to excess calories without serious comorbidity with body mass index (BMI) of 40.0 to 44.9 in adult ([place])  E66.01    Z68.41   4. Class 2 obesity due to excess calories without serious comorbidity with body mass index (BMI) of 37.0 to 37.9 in adult  E66.09 phentermine 30 MG capsule   Z68.37     No orders of the defined types were placed in this encounter.  Medications Discontinued During This Encounter  Medication Reason  . predniSONE (DELTASONE) 10 MG tablet Error  . allopurinol (ZYLOPRIM) 100 MG tablet Patient Preference  . buPROPion (WELLBUTRIN SR) 150 MG 12 hr tablet Patient Preference  . phentermine 30 MG capsule Prescription never filled  . amLODipine (NORVASC) 2.5 MG tablet Discontinued by provider   No orders of the defined types were placed in this encounter.    Recommendations:   Jonathan Powers  is a  69 y.o. Caucasian male with history of idiopathic gout, hypertension, mild hypertriglyceridemia and metabolic syndrome with elevated blood sugar without diabetes, moderate obesity, obstructive sleep apnea on CPAP evaluted by me for hypertension and chest pain and has had a Coronary CTA on 11/19/2018 which revealed mild luminal  irregularity and a calcium score of 250.  Patient presents today for 62-month follow-up of hypertension, obesity.  I started him on Wellbutrin and phentermine for weight loss, however he did not tolerate the medication and discontinued this.  Suspect Wellbutrin to be the etiology for his intolerance, he will try phentermine.  He has lost about 20 pounds in weight and has maintained that weight loss but still has morbid obesity.  It would be helpful if he can lose at least another 25 pounds or so.  Since weight loss, his blood pressure has been soft.  I will discontinue amlodipine 2.5 mg daily.  He will continue with olmesartan HCT.  I hear a new right carotid bruit, will obtain carotid artery duplex.  I would like to see him back on annual basis in view of coronary calcification and his underlying cardiovascular risk.  Lifestyle modification, dietary changes and exercise again discussed with the patient.     Adrian Prows, MD, [facility] 03/04/2020, 12:29 PM Office: [phone] Pager: [phone]

## 2020-03-09 DIAGNOSIS — S62635B Displaced fracture of distal phalanx of left ring finger, initial encounter for open fracture: Secondary | ICD-10-CM | POA: Diagnosis not present

## 2020-03-09 DIAGNOSIS — Z4789 Encounter for other orthopedic aftercare: Secondary | ICD-10-CM | POA: Diagnosis not present

## 2020-03-09 DIAGNOSIS — M19032 Primary osteoarthritis, left wrist: Secondary | ICD-10-CM | POA: Diagnosis not present

## 2020-03-12 ENCOUNTER — Encounter: Payer: Self-pay | Admitting: Internal Medicine

## 2020-03-18 ENCOUNTER — Ambulatory Visit: Payer: Medicare Other

## 2020-03-18 ENCOUNTER — Other Ambulatory Visit: Payer: Self-pay

## 2020-03-18 DIAGNOSIS — R0989 Other specified symptoms and signs involving the circulatory and respiratory systems: Secondary | ICD-10-CM | POA: Diagnosis not present

## 2020-03-23 ENCOUNTER — Telehealth (INDEPENDENT_AMBULATORY_CARE_PROVIDER_SITE_OTHER): Payer: Medicare Other | Admitting: Internal Medicine

## 2020-03-23 ENCOUNTER — Encounter: Payer: Self-pay | Admitting: Internal Medicine

## 2020-03-23 DIAGNOSIS — J0100 Acute maxillary sinusitis, unspecified: Secondary | ICD-10-CM | POA: Diagnosis not present

## 2020-03-23 MED ORDER — AZITHROMYCIN 250 MG PO TABS
ORAL_TABLET | ORAL | 0 refills | Status: DC
Start: 2020-03-23 — End: 2020-07-21

## 2020-03-23 NOTE — Progress Notes (Signed)
Virtual Visit via Video Note  I connected with Jonathan Powers on 03/23/20 at  3:20 PM EST by a video enabled telemedicine application and verified that I am speaking with the correct person using two identifiers.  The patient and the provider were at separate locations throughout the entire encounter. Patient location: home, Provider location: work   I discussed the limitations of evaluation and management by telemedicine and the availability of in person appointments. The patient expressed understanding and agreed to proceed. The patient and the provider were the only parties present for the visit unless noted in HPI below.  History of Present Illness: The patient is a 70 y.o. man with visit for sinus pressure and drainage. Started 2-3 days ago. Denies fevers or chills. Denies known covid-19 exposure but works in [employer]. Has had vaccine dose times 2 and booster. Denies body aches. Overall it is not improving. [place]aving for [place] and wants an antibiotic to take with him. Takes zyrtec daily and denies missing doses recently. Has not taken other otc products. Has been vaccinated and booster.   Observations/Objective: Appearance: normal, breathing appears normal, casual grooming, abdomen does not appear distended, throat not well visualized, mental status is A and O times 3  Assessment and Plan: See problem oriented charting  Follow Up Instructions: rx azithromycin but encouraged not to start taking for at least 1 week as most sinus problems are viral and antibiotics are not recommended until about 2 weeks into symptoms  I discussed the assessment and treatment plan with the patient. The patient was provided an opportunity to ask questions and all were answered. The patient agreed with the plan and demonstrated an understanding of the instructions.   The patient was advised to call back or seek an in-person evaluation if the symptoms worsen or if the condition fails to improve as  anticipated.  Hoyt Koch, MD

## 2020-03-24 ENCOUNTER — Encounter: Payer: Self-pay | Admitting: Internal Medicine

## 2020-03-24 NOTE — Assessment & Plan Note (Signed)
Rx azithromycin for current sinus symptoms. He does not currently need antibiotic but given upcoming travel rx provided to fill and take with him. Advised given covid-19 surge to get tested prior to travel so that if this is covid-19 he is not spreading this while traveling.

## 2020-04-06 DIAGNOSIS — S62635D Displaced fracture of distal phalanx of left ring finger, subsequent encounter for fracture with routine healing: Secondary | ICD-10-CM | POA: Diagnosis not present

## 2020-04-27 DIAGNOSIS — S61215D Laceration without foreign body of left ring finger without damage to nail, subsequent encounter: Secondary | ICD-10-CM | POA: Diagnosis not present

## 2020-04-27 DIAGNOSIS — S62635B Displaced fracture of distal phalanx of left ring finger, initial encounter for open fracture: Secondary | ICD-10-CM | POA: Diagnosis not present

## 2020-04-30 ENCOUNTER — Other Ambulatory Visit: Payer: Self-pay | Admitting: Cardiology

## 2020-04-30 DIAGNOSIS — I10 Essential (primary) hypertension: Secondary | ICD-10-CM

## 2020-05-21 DIAGNOSIS — D485 Neoplasm of uncertain behavior of skin: Secondary | ICD-10-CM | POA: Diagnosis not present

## 2020-05-21 DIAGNOSIS — L814 Other melanin hyperpigmentation: Secondary | ICD-10-CM | POA: Diagnosis not present

## 2020-05-21 DIAGNOSIS — L821 Other seborrheic keratosis: Secondary | ICD-10-CM | POA: Diagnosis not present

## 2020-05-21 DIAGNOSIS — D225 Melanocytic nevi of trunk: Secondary | ICD-10-CM | POA: Diagnosis not present

## 2020-05-24 ENCOUNTER — Other Ambulatory Visit: Payer: Self-pay | Admitting: Internal Medicine

## 2020-05-24 DIAGNOSIS — E785 Hyperlipidemia, unspecified: Secondary | ICD-10-CM

## 2020-05-25 DIAGNOSIS — M1812 Unilateral primary osteoarthritis of first carpometacarpal joint, left hand: Secondary | ICD-10-CM | POA: Diagnosis not present

## 2020-05-25 DIAGNOSIS — M19032 Primary osteoarthritis, left wrist: Secondary | ICD-10-CM | POA: Diagnosis not present

## 2020-05-25 DIAGNOSIS — D3612 Benign neoplasm of peripheral nerves and autonomic nervous system, upper limb, including shoulder: Secondary | ICD-10-CM | POA: Diagnosis not present

## 2020-05-28 ENCOUNTER — Other Ambulatory Visit: Payer: Self-pay | Admitting: Cardiology

## 2020-05-28 DIAGNOSIS — I10 Essential (primary) hypertension: Secondary | ICD-10-CM

## 2020-05-31 ENCOUNTER — Other Ambulatory Visit: Payer: Self-pay | Admitting: Internal Medicine

## 2020-05-31 DIAGNOSIS — E039 Hypothyroidism, unspecified: Secondary | ICD-10-CM

## 2020-06-08 DIAGNOSIS — Z6841 Body Mass Index (BMI) 40.0 and over, adult: Secondary | ICD-10-CM | POA: Diagnosis not present

## 2020-06-08 DIAGNOSIS — M79641 Pain in right hand: Secondary | ICD-10-CM | POA: Diagnosis not present

## 2020-06-08 DIAGNOSIS — R5382 Chronic fatigue, unspecified: Secondary | ICD-10-CM | POA: Diagnosis not present

## 2020-06-08 DIAGNOSIS — M2550 Pain in unspecified joint: Secondary | ICD-10-CM | POA: Diagnosis not present

## 2020-06-08 DIAGNOSIS — M150 Primary generalized (osteo)arthritis: Secondary | ICD-10-CM | POA: Diagnosis not present

## 2020-06-08 DIAGNOSIS — M25542 Pain in joints of left hand: Secondary | ICD-10-CM | POA: Diagnosis not present

## 2020-06-22 ENCOUNTER — Encounter: Payer: Self-pay | Admitting: Internal Medicine

## 2020-06-28 ENCOUNTER — Other Ambulatory Visit (HOSPITAL_COMMUNITY): Payer: Self-pay | Admitting: *Deleted

## 2020-06-28 DIAGNOSIS — M79641 Pain in right hand: Secondary | ICD-10-CM | POA: Diagnosis not present

## 2020-06-28 DIAGNOSIS — M150 Primary generalized (osteo)arthritis: Secondary | ICD-10-CM | POA: Diagnosis not present

## 2020-06-28 DIAGNOSIS — R5382 Chronic fatigue, unspecified: Secondary | ICD-10-CM | POA: Diagnosis not present

## 2020-06-28 DIAGNOSIS — M2550 Pain in unspecified joint: Secondary | ICD-10-CM | POA: Diagnosis not present

## 2020-06-28 DIAGNOSIS — M25542 Pain in joints of left hand: Secondary | ICD-10-CM | POA: Diagnosis not present

## 2020-06-28 DIAGNOSIS — Z6841 Body Mass Index (BMI) 40.0 and over, adult: Secondary | ICD-10-CM | POA: Diagnosis not present

## 2020-06-29 ENCOUNTER — Other Ambulatory Visit: Payer: Self-pay

## 2020-06-29 ENCOUNTER — Ambulatory Visit (INDEPENDENT_AMBULATORY_CARE_PROVIDER_SITE_OTHER): Payer: Medicare Other | Admitting: Internal Medicine

## 2020-06-29 ENCOUNTER — Encounter: Payer: Self-pay | Admitting: Internal Medicine

## 2020-06-29 VITALS — BP 124/68 | HR 84 | Temp 98.3°F | Resp 18 | Ht 69.0 in | Wt 273.2 lb

## 2020-06-29 DIAGNOSIS — E038 Other specified hypothyroidism: Secondary | ICD-10-CM

## 2020-06-29 DIAGNOSIS — S61215D Laceration without foreign body of left ring finger without damage to nail, subsequent encounter: Secondary | ICD-10-CM | POA: Diagnosis not present

## 2020-06-29 DIAGNOSIS — R413 Other amnesia: Secondary | ICD-10-CM | POA: Insufficient documentation

## 2020-06-29 DIAGNOSIS — R739 Hyperglycemia, unspecified: Secondary | ICD-10-CM | POA: Insufficient documentation

## 2020-06-29 DIAGNOSIS — I10 Essential (primary) hypertension: Secondary | ICD-10-CM | POA: Diagnosis not present

## 2020-06-29 DIAGNOSIS — M1812 Unilateral primary osteoarthritis of first carpometacarpal joint, left hand: Secondary | ICD-10-CM | POA: Diagnosis not present

## 2020-06-29 DIAGNOSIS — M19032 Primary osteoarthritis, left wrist: Secondary | ICD-10-CM | POA: Diagnosis not present

## 2020-06-29 DIAGNOSIS — S62635D Displaced fracture of distal phalanx of left ring finger, subsequent encounter for fracture with routine healing: Secondary | ICD-10-CM | POA: Diagnosis not present

## 2020-06-29 HISTORY — DX: Hyperglycemia, unspecified: R73.9

## 2020-06-29 LAB — TSH: TSH: 1.47 u[IU]/mL (ref 0.35–4.50)

## 2020-06-29 LAB — CBC WITH DIFFERENTIAL/PLATELET
Basophils Absolute: 0 10*3/uL (ref 0.0–0.1)
Basophils Relative: 0.4 % (ref 0.0–3.0)
Eosinophils Absolute: 0.1 10*3/uL (ref 0.0–0.7)
Eosinophils Relative: 1.2 % (ref 0.0–5.0)
HCT: 43.6 % (ref 39.0–52.0)
Hemoglobin: 15 g/dL (ref 13.0–17.0)
Lymphocytes Relative: 22 % (ref 12.0–46.0)
Lymphs Abs: 1.7 10*3/uL (ref 0.7–4.0)
MCHC: 34.4 g/dL (ref 30.0–36.0)
MCV: 89.3 fl (ref 78.0–100.0)
Monocytes Absolute: 0.9 10*3/uL (ref 0.1–1.0)
Monocytes Relative: 12 % (ref 3.0–12.0)
Neutro Abs: 4.9 10*3/uL (ref 1.4–7.7)
Neutrophils Relative %: 64.4 % (ref 43.0–77.0)
Platelets: 309 10*3/uL (ref 150.0–400.0)
RBC: 4.89 Mil/uL (ref 4.22–5.81)
RDW: 12.9 % (ref 11.5–15.5)
WBC: 7.6 10*3/uL (ref 4.0–10.5)

## 2020-06-29 LAB — HEPATIC FUNCTION PANEL
ALT: 20 U/L (ref 0–53)
AST: 17 U/L (ref 0–37)
Albumin: 4.2 g/dL (ref 3.5–5.2)
Alkaline Phosphatase: 47 U/L (ref 39–117)
Bilirubin, Direct: 0.1 mg/dL (ref 0.0–0.3)
Total Bilirubin: 0.6 mg/dL (ref 0.2–1.2)
Total Protein: 6.9 g/dL (ref 6.0–8.3)

## 2020-06-29 LAB — HEMOGLOBIN A1C: Hgb A1c MFr Bld: 5.8 % (ref 4.6–6.5)

## 2020-06-29 LAB — FOLATE: Folate: >24.4 ng/mL (ref >5.9)

## 2020-06-29 LAB — VITAMIN B12: Vitamin B-12: 416 pg/mL (ref 211–911)
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
## 2020-06-29 NOTE — Patient Instructions (Signed)
Hypothyroidism  Hypothyroidism is when the thyroid gland does not make enough of certain hormones (it is underactive). The thyroid gland is a small gland located in the lower front part of the neck, just in front of the windpipe (trachea). This gland makes hormones that help control how the body uses food for energy (metabolism) as well as how the heart and brain function. These hormones also play a role in keeping your bones strong. When the thyroid is underactive, it produces too little of the hormones thyroxine (T4) and triiodothyronine (T3). What are the causes? This condition may be caused by:  Hashimoto's disease. This is a disease in which the body's disease-fighting system (immune system) attacks the thyroid gland. This is the most common cause.  Viral infections.  Pregnancy.  Certain medicines.  Birth defects.  Past radiation treatments to the head or neck for cancer.  Past treatment with radioactive iodine.  Past exposure to radiation in the environment.  Past surgical removal of part or all of the thyroid.  Problems with a gland in the center of the brain (pituitary gland).  Lack of enough iodine in the diet. What increases the risk? You are more likely to develop this condition if:  You are male.  You have a family history of thyroid conditions.  You use a medicine called lithium.  You take medicines that affect the immune system (immunosuppressants). What are the signs or symptoms? Symptoms of this condition include:  Feeling as though you have no energy (lethargy).  Not being able to tolerate cold.  Weight gain that is not explained by a change in diet or exercise habits.  Lack of appetite.  Dry skin.  Coarse hair.  Menstrual irregularity.  Slowing of thought processes.  Constipation.  Sadness or depression. How is this diagnosed? This condition may be diagnosed based on:  Your symptoms, your medical history, and a physical exam.  Blood  tests. You may also have imaging tests, such as an ultrasound or MRI. How is this treated? This condition is treated with medicine that replaces the thyroid hormones that your body does not make. After you begin treatment, it may take several weeks for symptoms to go away. Follow these instructions at home:  Take over-the-counter and prescription medicines only as told by your health care provider.  If you start taking any new medicines, tell your health care provider.  Keep all follow-up visits as told by your health care provider. This is important. ? As your condition improves, your dosage of thyroid hormone medicine may change. ? You will need to have blood tests regularly so that your health care provider can monitor your condition. Contact a health care provider if:  Your symptoms do not get better with treatment.  You are taking thyroid hormone replacement medicine and you: ? Sweat a lot. ? Have tremors. ? Feel anxious. ? Lose weight rapidly. ? Cannot tolerate heat. ? Have emotional swings. ? Have diarrhea. ? Feel weak. Get help right away if you have:  Chest pain.  An irregular heartbeat.  A rapid heartbeat.  Difficulty breathing. Summary  Hypothyroidism is when the thyroid gland does not make enough of certain hormones (it is underactive).  When the thyroid is underactive, it produces too little of the hormones thyroxine (T4) and triiodothyronine (T3).  The most common cause is Hashimoto's disease, a disease in which the body's disease-fighting system (immune system) attacks the thyroid gland. The condition can also be caused by viral infections, medicine, pregnancy, or   past radiation treatment to the head or neck.  Symptoms may include weight gain, dry skin, constipation, feeling as though you do not have energy, and not being able to tolerate cold.  This condition is treated with medicine to replace the thyroid hormones that your body does not make. This  information is not intended to replace advice given to you by your health care provider. Make sure you discuss any questions you have with your health care provider. Document Revised: 11/07/2019 Document Reviewed: 10/23/2019 Elsevier Patient Education  2021 [employer]  

## 2020-06-30 ENCOUNTER — Ambulatory Visit (HOSPITAL_BASED_OUTPATIENT_CLINIC_OR_DEPARTMENT_OTHER)
Admission: RE | Admit: 2020-06-30 | Discharge: 2020-06-30 | Disposition: A | Discharge status: Home / Self Care | Payer: Medicare Other | Source: Ambulatory Visit | Attending: Cardiology | Admitting: Cardiology

## 2020-07-01 ENCOUNTER — Encounter: Payer: Self-pay | Admitting: Internal Medicine

## 2020-07-02 NOTE — Telephone Encounter (Signed)
From pt
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
## 2020-07-05 LAB — RPR: RPR Ser Ql: NONREACTIVE

## 2020-07-05 LAB — VITAMIN B1: Vitamin B1 (Thiamine): 18 nmol/L (ref 8–30)

## 2020-07-06 ENCOUNTER — Other Ambulatory Visit: Payer: Self-pay

## 2020-07-06 ENCOUNTER — Other Ambulatory Visit: Payer: Self-pay | Admitting: Physician Assistant

## 2020-07-06 ENCOUNTER — Ambulatory Visit (INDEPENDENT_AMBULATORY_CARE_PROVIDER_SITE_OTHER): Payer: Medicare Other | Admitting: Physician Assistant

## 2020-07-06 ENCOUNTER — Encounter: Payer: Self-pay | Admitting: Physician Assistant

## 2020-07-06 VITALS — BP 129/70 | HR 73 | Resp 20 | Ht 69.0 in | Wt 274.0 lb

## 2020-07-06 DIAGNOSIS — R413 Other amnesia: Secondary | ICD-10-CM

## 2020-07-06 NOTE — Patient Instructions (Addendum)
It was a pleasure to see you today at our office.   Recommendations:  Meds: Follow up immediately after the results of the tests are available  Neurocognitive evaluation at our office MRI of the brain with and without , the office will call you to arrange you appointment    RECOMMENDATIONS FOR ALL PATIENTS WITH MEMORY PROBLEMS: 1. Continue to exercise (Recommend 30 minutes of walking everyday, or 3 hours every week) 2. Increase social interactions - continue going to [place] and enjoy social gatherings with friends and family 3. Eat healthy, avoid fried foods and eat more fruits and vegetables 4. Maintain adequate blood pressure, blood sugar, and blood cholesterol level. Reducing the risk of stroke and cardiovascular disease also helps promoting better memory. 5. Avoid stressful situations. Live a simple life and avoid aggravations. Organize your time and prepare for the next day in anticipation. 6. Sleep well, avoid any interruptions of sleep and avoid any distractions in the bedroom that may interfere with adequate sleep quality 7. Avoid sugar, avoid sweets as there is a strong link between excessive sugar intake, diabetes, and cognitive impairment We discussed the Mediterranean diet, which has been shown to help patients reduce the risk of progressive memory disorders and reduces cardiovascular risk. This includes eating fish, eat fruits and green leafy vegetables, nuts like almonds and hazelnuts, walnuts, and also use olive oil. Avoid fast foods and fried foods as much as possible. Avoid sweets and sugar as sugar use has been linked to worsening of memory function.  There is always a concern of gradual progression of memory problems. If this is the case, then we may need to adjust level of care according to patient needs. Support, both to the patient and caregiver, should then be put into place.      You have been referred for a neuropsychological evaluation (i.e., evaluation of memory  and thinking abilities). Please bring someone with you to this appointment if possible, as it is helpful for the doctor to hear from both you and another adult who knows you well. Please bring eyeglasses and hearing aids if you wear them.    The evaluation will take approximately 3 hours and has two parts:   . The first part is a clinical interview with the neuropsychologist (Dr. Melvyn Novas or Dr. Nicole Kindred). During the interview, the neuropsychologist will speak with you and the individual you brought to the appointment.    . The second part of the evaluation is testing with the doctor's technician Hinton Dyer or Maudie Mercury). During the testing, the technician will ask you to remember different types of material, solve problems, and answer some questionnaires. Your family member will not be present for this portion of the evaluation.   Please note: We must reserve several hours of the neuropsychologist's time and the psychometrician's time for your evaluation appointment. As such, there is a No-Show fee of $100. If you are unable to attend any of your appointments, please contact our office as soon as possible to reschedule.    FALL PRECAUTIONS: Be cautious when walking. Scan the area for obstacles that may increase the risk of trips and falls. When getting up in the mornings, sit up at the edge of the bed for a few minutes before getting out of bed. Consider elevating the bed at the head end to avoid drop of blood pressure when getting up. Walk always in a well-lit room (use night lights in the walls). Avoid area rugs or power cords from appliances in the middle  of the walkways. Use a walker or a cane if necessary and consider physical therapy for balance exercise. Get your eyesight checked regularly.  FINANCIAL OVERSIGHT: Supervision, especially oversight when making financial decisions or transactions is also recommended.  HOME SAFETY: Consider the safety of the kitchen when operating appliances like stoves, microwave  oven, and blender. Consider having supervision and share cooking responsibilities until no longer able to participate in those. Accidents with firearms and other hazards in the house should be identified and addressed as well.     MEDICATION SUPERVISION: Inability to self-administer medication needs to be constantly addressed. Implement a mechanism to ensure safe administration of the medications.       Mediterranean Diet A Mediterranean diet refers to food and lifestyle choices that are based on the traditions of countries located on the [employer]. This way of eating has been shown to help prevent certain conditions and improve outcomes for people who have chronic diseases, like kidney disease and heart disease. What are tips for following this plan? Lifestyle   Cook and eat meals together with your family, when possible.  Drink enough fluid to keep your urine clear or pale yellow.  Be physically active every day. This includes:  Aerobic exercise like running or swimming.  Leisure activities like gardening, walking, or housework.  Get 7-8 hours of sleep each night.  If recommended by your health care provider, drink red wine in moderation. This means 1 glass a day for nonpregnant women and 2 glasses a day for men. A glass of wine equals 5 oz (150 mL). Reading food labels   Check the serving size of packaged foods. For foods such as rice and pasta, the serving size refers to the amount of cooked product, not dry.  Check the total fat in packaged foods. Avoid foods that have saturated fat or trans fats.  Check the ingredients list for added sugars, such as corn syrup. Shopping   At the grocery store, buy most of your food from the areas near the walls of the store. This includes:  Fresh fruits and vegetables (produce).  Grains, beans, nuts, and seeds. Some of these may be available in unpackaged forms or large amounts (in bulk).  Fresh seafood.  Poultry and  eggs.  Low-fat dairy products.  Buy whole ingredients instead of prepackaged foods.  Buy fresh fruits and vegetables in-season from local farmers markets.  Buy frozen fruits and vegetables in resealable bags.  If you do not have access to quality fresh seafood, buy precooked frozen shrimp or canned fish, such as tuna, salmon, or sardines.  Buy small amounts of raw or cooked vegetables, salads, or olives from the deli or salad bar at your store.  Stock your pantry so you always have certain foods on hand, such as olive oil, canned tuna, canned tomatoes, rice, pasta, and beans. Cooking   Cook foods with extra-virgin olive oil instead of using butter or other vegetable oils.  Have meat as a side dish, and have vegetables or grains as your main dish. This means having meat in small portions or adding small amounts of meat to foods like pasta or stew.  Use beans or vegetables instead of meat in common dishes like chili or lasagna.  Experiment with different cooking methods. Try roasting or broiling vegetables instead of steaming or sauteing them.  Add frozen vegetables to soups, stews, pasta, or rice.  Add nuts or seeds for added healthy fat at each meal. You can add these to  yogurt, salads, or vegetable dishes.  Marinate fish or vegetables using olive oil, lemon juice, garlic, and fresh herbs. Meal planning   Plan to eat 1 vegetarian meal one day each week. Try to work up to 2 vegetarian meals, if possible.  Eat seafood 2 or more times a week.  Have healthy snacks readily available, such as:  Vegetable sticks with hummus.  Greek yogurt.  Fruit and nut trail mix.  Eat balanced meals throughout the week. This includes:  Fruit: 2-3 servings a day  Vegetables: 4-5 servings a day  Low-fat dairy: 2 servings a day  Fish, poultry, or lean meat: 1 serving a day  Beans and legumes: 2 or more servings a week  Nuts and seeds: 1-2 servings a day  Whole grains: 6-8 servings a  day  Extra-virgin olive oil: 3-4 servings a day  Limit red meat and sweets to only a few servings a month What are my food choices?  Mediterranean diet  Recommended  Grains: Whole-grain pasta. Brown rice. Bulgar wheat. Polenta. Couscous. Whole-wheat bread. [place].  Vegetables: Artichokes. Beets. Broccoli. Cabbage. Carrots. Eggplant. Green beans. Chard. Kale. Spinach. Onions. Leeks. Peas. Squash. Tomatoes. Peppers. Radishes.  Fruits: Apples. Apricots. Avocado. Berries. Bananas. Cherries. Dates. Figs. Grapes. Lemons. Melon. Oranges. Peaches. Plums. Pomegranate.  Meats and other protein foods: Beans. Almonds. Sunflower seeds. Pine nuts. Peanuts. [place]. Salmon. Scallops. Shrimp. [place]. Tilapia. Clams. Oysters. Eggs.  Dairy: Low-fat milk. Cheese. Greek yogurt.  Beverages: Water. Red wine. Herbal tea.  Fats and oils: Extra virgin olive oil. Avocado oil. Grape seed oil.  Sweets and desserts: [place] yogurt with honey. Baked apples. Poached pears. Trail mix.  Seasoning and other foods: Basil. Cilantro. Coriander. Cumin. Mint. Parsley. Sage. Rosemary. Tarragon. Garlic. Oregano. Thyme. Pepper. Balsalmic vinegar. Tahini. Hummus. Tomato sauce. Olives. Mushrooms.  Limit these  Grains: Prepackaged pasta or rice dishes. Prepackaged cereal with added sugar.  Vegetables: Deep fried potatoes (french fries).  Fruits: Fruit canned in syrup.  Meats and other protein foods: Beef. Pork. Lamb. Poultry with skin. Hot dogs. Berniece Salines.  Dairy: Ice cream. Sour cream. Whole milk.  Beverages: Juice. Sugar-sweetened soft drinks. Beer. Liquor and spirits.  Fats and oils: Butter. Canola oil. Vegetable oil. Beef fat (tallow). Lard.  Sweets and desserts: Cookies. Cakes. Pies. Candy.  Seasoning and other foods: Mayonnaise. Premade sauces and marinades.  The items listed may not be a complete list. Talk with your dietitian about what dietary choices are right for you. Summary  The Mediterranean diet  includes both food and lifestyle choices.  Eat a variety of fresh fruits and vegetables, beans, nuts, seeds, and whole grains.  Limit the amount of red meat and sweets that you eat.  Talk with your health care provider about whether it is safe for you to drink red wine in moderation. This means 1 glass a day for nonpregnant women and 2 glasses a day for men. A glass of wine equals 5 oz (150 mL). This information is not intended to replace advice given to you by your health care provider. Make sure you discuss any questions you have with your health care provider. Document Released: 09/30/2015 Document Revised: 11/02/2015 Document Reviewed: 09/30/2015 Elsevier Interactive Patient Education  2017 [employer].

## 2020-07-12 ENCOUNTER — Other Ambulatory Visit: Payer: Self-pay | Admitting: Student

## 2020-07-12 DIAGNOSIS — E6609 Other obesity due to excess calories: Secondary | ICD-10-CM

## 2020-07-14 ENCOUNTER — Other Ambulatory Visit: Payer: Self-pay | Admitting: Student

## 2020-07-14 DIAGNOSIS — Z6837 Body mass index (BMI) 37.0-37.9, adult: Secondary | ICD-10-CM

## 2020-07-14 DIAGNOSIS — E6609 Other obesity due to excess calories: Secondary | ICD-10-CM

## 2020-07-14 NOTE — Telephone Encounter (Signed)
Refill request

## 2020-07-18 ENCOUNTER — Other Ambulatory Visit: Payer: Self-pay | Admitting: Student

## 2020-07-18 DIAGNOSIS — F3289 Other specified depressive episodes: Secondary | ICD-10-CM

## 2020-07-18 DIAGNOSIS — E6609 Other obesity due to excess calories: Secondary | ICD-10-CM

## 2020-07-20 ENCOUNTER — Encounter: Payer: Self-pay | Admitting: Internal Medicine

## 2020-07-21 ENCOUNTER — Telehealth (INDEPENDENT_AMBULATORY_CARE_PROVIDER_SITE_OTHER): Payer: Medicare Other | Admitting: Internal Medicine

## 2020-07-21 DIAGNOSIS — J329 Chronic sinusitis, unspecified: Secondary | ICD-10-CM

## 2020-07-21 MED ORDER — AZITHROMYCIN 250 MG PO TABS: ORAL_TABLET | ORAL | 1 refills | Status: DC

## 2020-07-21 NOTE — Progress Notes (Signed)
Patient ID: Jonathan Powers, male   DOB: 1950/05/14, 70 y.o.   MRN: [id number]  Virtual Visit via Video Note  I connected with [name] on 07/24/20 at  2:00 PM EDT by a video enabled telemedicine application and verified that I am speaking with the correct person using two identifiers.  Location of all participants today  Patient: at home Provider: at office   I discussed the limitations of evaluation and management by telemedicine and the availability of in person appointments. The patient expressed understanding and agreed to proceed.  History of Present Illness:  Here with 2-3 days acute onset fever, facial pain, pressure, headache, general weakness and malaise, and greenish d/c, with mild ST and cough, but pt denies chest pain, wheezing, increased sob or doe, orthopnea, PND, increased LE swelling, palpitations, dizziness or syncope. Has not checked covid with onset of symptoms so far.   Past Medical History:  Diagnosis Date  . ALLERGIC RHINITIS   . Cancer ([place])    Melonoma back  . CELLULITIS, LEG, RIGHT    Recurrent R hip cellulitis 1/08,3/08   . Diverticulitis   . GERD (gastroesophageal reflux disease)   . Gout   . Hashimoto's thyroiditis   . Hypertension   . Hypogonadism male    low T, a/w ED  . Hypothyroid    goiters   . Left thyroid nodule 2008   on [place], decrease size in 2011 [place]  . Migraines    Atypical and ocular  . OSTEOARTHRITIS   . Seasonal allergies   . Sleep apnea    cpap   Past Surgical History:  Procedure Laterality Date  . CARPAL TUNNEL RELEASE Left 03/30/2017   Procedure: CARPAL TUNNEL RELEASE;  Surgeon: Garald Balding, MD;  Location: [facility];  Service: Orthopedics;  Laterality: Left;  . CHOLECYSTECTOMY  2004  . CTR Bilateral 1982  . CYSTOSCOPY  1974  . FOOT FUSION Left 06/22/2017  . HEMORRHOID SURGERY N/A 03/30/2017   Procedure: HEMORRHOIDECTOMY;  Surgeon: Coralie Keens, MD;  Location: [facility];  Service:  General;  Laterality: N/A;  . open reduction L little finger Left 1970  . Repair digital thumb, left Left 1990  . Right shoulder cuff repair Right 09/15/11  . Right shoulder SAD, DCR Right 02/05/11  . TOTAL HIP ARTHROPLASTY Left 07/26/06  . TOTAL HIP ARTHROPLASTY Right 1999  . TOTAL HIP REVISION Left 03/07/2019   Procedure: LEFT TOTAL HIP REVISION POSTERIOR APPROACH;  Surgeon: Frederik Pear, MD;  Location: [facility];  Service: Orthopedics;  Laterality: Left;    reports that he has never smoked. He has never used smokeless tobacco. He reports current alcohol use. He reports that he does not use drugs. family history includes Arthritis in his father and mother; Cancer in his paternal grandfather and paternal grandmother; Diabetes in his father and mother; Heart disease in his father; Multiple sclerosis in his mother; Thyroid cancer in his sister; Uterine cancer in his sister; Vascular Disease in his father. Allergies  Allergen Reactions  . Nsaids Shortness Of Breath    Naproxen specifically causes SOB/wheeze tolerates topical diclofenac without problem Tylenol OK  . Tolmetin Shortness Of Breath    Naproxen specifically causes SOB/wheeze tolerates topical diclofenac without problem Tylenol OK  . Penicillins Rash    Did it involve swelling of the face/tongue/throat, SOB, or low BP? No Did it involve sudden or severe rash/hives, skin peeling, or any reaction on the inside of your mouth or nose? No  Did you need to seek medical attention at a hospital or doctor's office? No When did it last happen? If all above answers are "NO", may proceed with cephalosporin use.    Current Outpatient Medications on File Prior to Visit  Medication Sig Dispense Refill  . acetaminophen (TYLENOL) 500 MG tablet Take 1,000 mg by mouth every 6 (six) hours as needed for moderate pain.     . cetirizine (ZYRTEC) 10 MG tablet Take 10 mg by mouth daily.    . colchicine 0.6 MG tablet TAKE 1 TABLET BY MOUTH EVERY DAY  (Patient not taking: Reported on 06/29/2020) 90 tablet 0  . diclofenac sodium (VOLTAREN) 1 % GEL 2-4 grams qid as needed for pain as discussed in office (Patient not taking: Reported on 07/06/2020) 10 Tube 12  . levothyroxine (SYNTHROID) 175 MCG tablet TAKE 1 TABLET BY MOUTH EVERY DAY BEFORE BREAKFAST 90 tablet 0  . olmesartan-hydrochlorothiazide (BENICAR HCT) 40-25 MG tablet TAKE 1 TABLET BY MOUTH EVERY DAY 90 tablet 0  . phentermine 30 MG capsule TAKE 1 CAPSULE BY MOUTH EVERY DAY IN THE MORNING 30 capsule 3  . promethazine-dextromethorphan (PROMETHAZINE-DM) 6.25-15 MG/5ML syrup Take 5 mLs by mouth 4 (four) times daily as needed for cough. 50 mL 0  . rosuvastatin (CRESTOR) 5 MG tablet TAKE ONE TABLET BY MOUTH DAILY AT BEDTIME 90 tablet 1  . tadalafil (CIALIS) 5 MG tablet Take 5 mg by mouth at bedtime.    . Testosterone 1.62 % GEL Apply 3 Pump topically daily.    . traZODone (DESYREL) 100 MG tablet Take 1 tablet (100 mg total) by mouth at bedtime as needed for sleep. (Patient taking differently: Take 100 mg by mouth at bedtime as needed for sleep. Take 1/2 tablet to 1 tablet) 90 tablet 1  . valACYclovir (VALTREX) 1000 MG tablet Take 1 tablet (1,000 mg total) by mouth 2 (two) times daily. (Patient taking differently: Take 1,000 mg by mouth as needed.) 14 tablet 5  . [DISCONTINUED] apixaban (ELIQUIS) 2.5 MG TABS tablet Take 1 tablet (2.5 mg total) by mouth 2 (two) times daily. 60 tablet 0   No current facility-administered medications on file prior to visit.    Observations/Objective: Alert, NAD, appropriate mood and affect, resps normal, cn 2-12 intact, moves all 4s, no visible rash or swelling Lab Results  Component Value Date   WBC 7.6 06/29/2020   HGB 15.0 06/29/2020   HCT 43.6 06/29/2020   PLT 309.0 06/29/2020   GLUCOSE 122 (H) 11/28/2019   CHOL 108 11/19/2018   TRIG 235 (H) 11/19/2018   HDL 39 (L) 11/19/2018   LDLCALC 40 11/19/2018   ALT 20 06/29/2020   AST 17 06/29/2020   NA 139  11/28/2019   K 3.9 11/28/2019   CL 103 11/28/2019   CREATININE 0.86 11/28/2019   BUN 14 11/28/2019   CO2 28 11/28/2019   TSH 1.47 06/29/2020   PSA 0.5 11/19/2018   INR 1.0 03/05/2019   HGBA1C 5.8 06/29/2020   Assessment and Plan: See notes  Follow Up Instructions: See notes   I discussed the assessment and treatment plan with the patient. The patient was provided an opportunity to ask questions and all were answered. The patient agreed with the plan and demonstrated an understanding of the instructions.   The patient was advised to call back or seek an in-person evaluation if the symptoms worsen or if the condition fails to improve as anticipated.   Cathlean Cower, MD

## 2020-07-24 ENCOUNTER — Encounter: Payer: Self-pay | Admitting: Internal Medicine

## 2020-07-24 DIAGNOSIS — J329 Chronic sinusitis, unspecified: Secondary | ICD-10-CM | POA: Insufficient documentation

## 2020-07-24 NOTE — Assessment & Plan Note (Signed)
Mild to mod, for antibx course,  to f/u any worsening symptoms or concerns, also to check COVID testing and report any positive testing

## 2020-07-24 NOTE — Patient Instructions (Signed)
Please take all new medication as prescribed 

## 2020-07-26 ENCOUNTER — Ambulatory Visit
Admission: RE | Admit: 2020-07-26 | Discharge: 2020-07-26 | Disposition: A | Discharge status: Home / Self Care | Payer: Medicare Other | Source: Ambulatory Visit | Attending: Physician Assistant | Admitting: Physician Assistant

## 2020-07-26 ENCOUNTER — Other Ambulatory Visit: Payer: Self-pay | Admitting: Orthopedic Surgery

## 2020-07-26 DIAGNOSIS — R413 Other amnesia: Secondary | ICD-10-CM | POA: Diagnosis not present

## 2020-07-26 DIAGNOSIS — J341 Cyst and mucocele of nose and nasal sinus: Secondary | ICD-10-CM | POA: Diagnosis not present

## 2020-07-26 MED ORDER — GADOBENATE DIMEGLUMINE 529 MG/ML IV SOLN
20.0000 mL | Freq: Once | INTRAVENOUS | Status: AC | PRN
  Administered 2020-07-26: 20 mL via INTRAVENOUS

## 2020-07-26 NOTE — Progress Notes (Signed)
Patient was seen for evaluation of his left foot.  He states he has been camping for the last 5 days and developed an ulcer in the webspace between the fourth and fifth toe.  Examination patient has a good dorsalis pedis and posterior tibial pulse there is no ascending cellulitis he has a ulcer in the fourth webspace that appears superficial there is maceration around the wound edges.  No ascending cellulitis.  A prescription for doxycycline is called in.  Patient was given a Vive wear sock to cut up in place in the webspace to help with the maceration drainage and infection.  Reevaluate next Monday.

## 2020-07-28 ENCOUNTER — Telehealth: Payer: Self-pay

## 2020-07-28 NOTE — Telephone Encounter (Signed)
normal MRI no acute findings, will follow with the neurocognitive exam, and see him thereafter

## 2020-07-28 NOTE — Telephone Encounter (Signed)
Pt called per DPR left a voice mail that MRI was normal and that we will follow up after his neurocognitive exam

## 2020-08-03 DIAGNOSIS — N481 Balanitis: Secondary | ICD-10-CM | POA: Diagnosis not present

## 2020-08-13 ENCOUNTER — Other Ambulatory Visit: Payer: Self-pay | Admitting: Internal Medicine

## 2020-08-13 ENCOUNTER — Other Ambulatory Visit: Payer: Self-pay | Admitting: Orthopedic Surgery

## 2020-08-13 ENCOUNTER — Telehealth: Payer: Self-pay | Admitting: Internal Medicine

## 2020-08-13 DIAGNOSIS — J40 Bronchitis, not specified as acute or chronic: Secondary | ICD-10-CM | POA: Insufficient documentation

## 2020-08-13 DIAGNOSIS — U071 COVID-19: Secondary | ICD-10-CM

## 2020-08-13 MED ORDER — PAXLOVID 20 X 150 MG & 10 X 100MG PO TBPK: 3.0000 | ORAL_TABLET | Freq: Two times a day (BID) | ORAL | 0 refills | Status: AC

## 2020-08-13 MED ORDER — PROMETHAZINE-DM 6.25-15 MG/5ML PO SYRP: 5.0000 mL | ORAL_SOLUTION | Freq: Four times a day (QID) | ORAL | 0 refills | Status: DC | PRN

## 2020-08-13 NOTE — Telephone Encounter (Signed)
   Patient said that he tested positive for covid 19 yesterday. He said that he is experiencing cough, congestion and weakness. He said that he was told to reach out to his PCP. Please advise

## 2020-08-13 NOTE — Telephone Encounter (Signed)
Pt has been informed and expressed understanding.  

## 2020-08-19 ENCOUNTER — Other Ambulatory Visit: Payer: Self-pay

## 2020-08-19 MED ORDER — VALACYCLOVIR HCL 1 G PO TABS: 1000.0000 mg | ORAL_TABLET | Freq: Two times a day (BID) | ORAL | 0 refills | Status: DC

## 2020-08-26 ENCOUNTER — Ambulatory Visit (INDEPENDENT_AMBULATORY_CARE_PROVIDER_SITE_OTHER): Payer: Medicare Other

## 2020-08-26 ENCOUNTER — Other Ambulatory Visit: Payer: Self-pay | Admitting: Orthopaedic Surgery

## 2020-08-26 ENCOUNTER — Encounter: Payer: Self-pay | Admitting: Orthopaedic Surgery

## 2020-08-26 ENCOUNTER — Ambulatory Visit (INDEPENDENT_AMBULATORY_CARE_PROVIDER_SITE_OTHER): Payer: Medicare Other | Admitting: Orthopaedic Surgery

## 2020-08-26 DIAGNOSIS — M542 Cervicalgia: Secondary | ICD-10-CM | POA: Diagnosis not present

## 2020-08-26 DIAGNOSIS — M545 Low back pain, unspecified: Secondary | ICD-10-CM | POA: Diagnosis not present

## 2020-08-26 DIAGNOSIS — G8929 Other chronic pain: Secondary | ICD-10-CM

## 2020-08-26 NOTE — Progress Notes (Signed)
Office Visit Note   Patient: Jonathan Powers           Date of Birth: April 08, 1950           MRN: [id number] Visit Date: 08/26/2020              Requested by: Jonathan Lima, MD [address] [place],  [place] [address] PCP: Jonathan Lima, MD   Assessment & Plan: Visit Diagnoses:  1. Neck pain   2. Low back pain, unspecified back pain laterality, unspecified chronicity, unspecified whether sciatica present   3. Chronic right-sided low back pain without sciatica     Plan: Jonathan Powers has had some chronic issues with both the cervical and lumbar spine.  There is significant degenerative changes by plain films.  He has had some loss of neck extension but no referred pain to either upper extremity.  In the lumbar spine has had a bit more trouble with some referred pain in probable radiculopathy into both lower extremities.  There are progressive degenerative changes but with plain films today.  He had an MRI scan of his lumbar spine 2 years ago demonstrating moderate right neuroforaminal stenosis at L5-S1 due to severe asymmetric facet hypertrophy and a small synovial cyst.  There was progressive L4-5 disc degeneration with mild spinal stenosis and moderate bilateral neuroforaminal stenosis.  He might be experiencing radicular pain possibly related to stenosis.  I would like Dr. Ernestina Powers to evaluate him for consideration of an epidural steroid injection.  Depending upon his results would consider repeat MRI scan.  He had +1 pulses in his feet were warm.  I am not sure that this is a vascular phenomenon but would like to evaluate that at some point as well  Follow-Up Instructions: Return if symptoms worsen or fail to improve.   Orders:  Orders Placed This Encounter  Procedures   XR Lumbar Spine 2-3 Views   XR Cervical Spine 2 or 3 views   No orders of the defined types were placed in this encounter.     Procedures: No procedures performed   Clinical Data: No additional  findings.   Subjective: Chief Complaint  Patient presents with   Neck - Pain   Lower Back - Pain  Jonathan Powers has been experiencing neck and low back pain.  The neck has been stiff and sore but without radicular discomfort to either upper extremity. His back has also been uncomfortable with stiffness and soreness and associated with referred pain to either upper extremity particularly when he stands for any length of time or ambulates any distance.  He has had pain as far distally as both calves.  HPI  Review of Systems   Objective: Vital Signs: There were no vitals taken for this visit.  Physical Exam Constitutional:      Appearance: He is well-developed.  Pulmonary:     Effort: Pulmonary effort is normal.  Skin:    General: Skin is warm and dry.  Neurological:     Mental Status: He is alert and oriented to person, place, and time.  Psychiatric:        Behavior: Behavior normal.    Ortho Exam awake alert and oriented x3.  Comfortable sitting.  Painless range of motion of both hips.  Has had prior hip replacement surgery and more recently has had revision of his left total hip.  Does have arthritis in both knees with some tenderness particularly laterally on the left.  His feet were warm.  I thought he had +1 pulses straight leg raise was negative.  No percussible tenderness of lumbar spine  Specialty Comments:  No specialty comments available.  Imaging: XR Cervical Spine 2 or 3 views  Result Date: 08/26/2020 Films of the cervical spine were obtained in 2 projections.  There is significant degenerative disc disease at C5 5 6 C6-7 and C4-5.  No listhesis.  No scoliosis.  Disc changes are also present with facet sclerosis at the same levels.  Films are consistent with advanced osteoarthritis  XR Lumbar Spine 2-3 Views  Result Date: 08/26/2020 Films lumbar spine were obtained in several projections.  There is near complete loss of the L4-5 disc space which has progressed from films  performed several years ago.  There is significant facet sclerosis at L4-5 and L5-S1.  No significant listhesis.  Spine is straight on the AP film.  There is diffuse calcification of the anterior longitudinal ligament with osteophytes.  Some calcification of the abdominal aorta but difficult to assess whether there is any dilatation.  There is significant sclerosis of the left SI joint possibly more progressive than from prior films.  Films are consistent with advanced arthritis of the lumbar spine.  Disc space height is narrowed at several other levels of the lumbar spine as well. Films of the thoracic spine were also obtained with some increased rest a kyphosis of about 35 to 40 degrees.  There is some compression of T12 which appears to be old    PMFS History: Patient Active Problem List   Diagnosis Date Noted   Neck pain 08/26/2020   Bronchitis due to COVID-19 virus 08/13/2020   Sinusitis 07/24/2020   Memory deficit 06/29/2020   Chronic hyperglycemia 06/29/2020   Generalized abdominal tenderness without rebound tenderness 07/16/2019   Failed total hip arthroplasty ([place]) 03/06/2019   Recurrent genital HSV (herpes simplex virus) infection 11/12/2018   Other specified hypothyroidism 11/11/2018   Insomnia 11/10/2018   GERD (gastroesophageal reflux disease) 11/10/2018   Seasonal allergies 11/10/2018   Recurrent oral herpes simplex 09/23/2018   Age-related osteoporosis with current pathological fracture 03/11/2018   Flexural eczema 07/24/2017   Routine general medical examination at a health care facility 10/12/2016   Chronic right-sided low back pain without sciatica 07/21/2016   Low testosterone 07/21/2016   Superior mesenteric artery aneurysm ([place]) 07/12/2015   Left thyroid nodule    OSA (obstructive sleep apnea) 01/27/2013   Hypothyroid    Symptomatic PVCs 09/06/2012   Dyslipidemia, goal LDL below 130 09/06/2012   BPH (benign prostatic hyperplasia) 09/06/2012   Essential hypertension  05/17/2006   ALLERGIC RHINITIS 05/17/2006   OSTEOARTHRITIS 05/17/2006   Past Medical History:  Diagnosis Date   ALLERGIC RHINITIS    Cancer ([place])    Melonoma back   CELLULITIS, LEG, RIGHT    Recurrent R hip cellulitis 1/08,3/08    Diverticulitis    GERD (gastroesophageal reflux disease)    Gout    Hashimoto's thyroiditis    Hypertension    Hypogonadism male    low T, a/w ED   Hypothyroid    goiters    Left thyroid nodule 2008   on [place], decrease size in 2011 [place]   Migraines    Atypical and ocular   OSTEOARTHRITIS    Seasonal allergies    Sleep apnea    cpap    Family History  Problem Relation Age of Onset   Arthritis Father    Heart disease Father    Diabetes Father  Vascular Disease Father        AoBifem   Arthritis Mother    Diabetes Mother    Multiple sclerosis Mother    Cancer Paternal Grandmother        "GI"   Cancer Paternal Grandfather        stomach cancer   Thyroid cancer Sister    Uterine cancer Sister     Past Surgical History:  Procedure Laterality Date   CARPAL TUNNEL RELEASE Left 03/30/2017   Procedure: CARPAL TUNNEL RELEASE;  Surgeon: Garald Balding, MD;  Location: [facility];  Service: Orthopedics;  Laterality: Left;   CHOLECYSTECTOMY  2004   CTR Bilateral [place]   FOOT FUSION Left 06/22/2017   HEMORRHOID SURGERY N/A 03/30/2017   Procedure: HEMORRHOIDECTOMY;  Surgeon: Coralie Keens, MD;  Location: [facility];  Service: General;  Laterality: N/A;   open reduction L little finger Left 1970   Repair digital thumb, left Left 1990   Right shoulder cuff repair Right 09/15/11   Right shoulder SAD, DCR Right 02/05/11   TOTAL HIP ARTHROPLASTY Left 07/26/06   TOTAL HIP ARTHROPLASTY Right 1999   TOTAL HIP REVISION Left 03/07/2019   Procedure: LEFT TOTAL HIP REVISION POSTERIOR APPROACH;  Surgeon: Frederik Pear, MD;  Location: [facility];  Service: Orthopedics;  Laterality: Left;   Social History    Occupational History   Occupation: Orthopedic PA    Employer: [employer]  Tobacco Use   Smoking status: Never   Smokeless tobacco: Never  Vaping Use   Vaping Use: Never used  Substance and Sexual Activity   Alcohol use: Yes    Alcohol/week: 0.0 standard drinks    Comment: rare   Drug use: No   Sexual activity: Yes     Garald Balding, MD   Note - This record has been created using Editor, commissioning.  Chart creation errors have been sought, but may not always  have been located. Such creation errors do not reflect on  the standard of medical care.

## 2020-08-30 DIAGNOSIS — R291 Meningismus: Secondary | ICD-10-CM | POA: Diagnosis not present

## 2020-08-30 DIAGNOSIS — N5201 Erectile dysfunction due to arterial insufficiency: Secondary | ICD-10-CM | POA: Diagnosis not present

## 2020-08-30 DIAGNOSIS — E291 Testicular hypofunction: Secondary | ICD-10-CM | POA: Diagnosis not present

## 2020-08-30 LAB — PSA: PSA: 0.88

## 2020-09-21 ENCOUNTER — Telehealth: Payer: Self-pay | Admitting: Internal Medicine

## 2020-09-24 ENCOUNTER — Other Ambulatory Visit: Payer: Self-pay | Admitting: Internal Medicine

## 2020-09-24 DIAGNOSIS — E039 Hypothyroidism, unspecified: Secondary | ICD-10-CM

## 2020-10-01 ENCOUNTER — Other Ambulatory Visit: Payer: Self-pay

## 2020-10-01 ENCOUNTER — Other Ambulatory Visit: Payer: Self-pay | Admitting: Cardiology

## 2020-10-01 ENCOUNTER — Encounter: Payer: Self-pay | Admitting: Counselor

## 2020-10-01 ENCOUNTER — Ambulatory Visit: Payer: Medicare Other

## 2020-10-01 ENCOUNTER — Ambulatory Visit (INDEPENDENT_AMBULATORY_CARE_PROVIDER_SITE_OTHER): Payer: Medicare Other | Admitting: Counselor

## 2020-10-01 DIAGNOSIS — G3184 Mild cognitive impairment, so stated: Secondary | ICD-10-CM | POA: Diagnosis not present

## 2020-10-01 DIAGNOSIS — G4733 Obstructive sleep apnea (adult) (pediatric): Secondary | ICD-10-CM

## 2020-10-01 DIAGNOSIS — E039 Hypothyroidism, unspecified: Secondary | ICD-10-CM

## 2020-10-01 DIAGNOSIS — F09 Unspecified mental disorder due to known physiological condition: Secondary | ICD-10-CM

## 2020-10-01 DIAGNOSIS — I10 Essential (primary) hypertension: Secondary | ICD-10-CM

## 2020-10-01 NOTE — Progress Notes (Signed)
   Psychometrist Note   Cognitive testing was administered to Jonathan Powers by Lamar Benes, B.S. (Technician) under the supervision of Alphonzo Severance, Psy.D., ABN. Mr. Treinen was able to tolerate all test procedures. Dr. Nicole Kindred met with the patient as needed to manage any emotional reactions to the testing procedures. Rest breaks were offered.    The battery of tests administered was selected by Dr. Nicole Kindred with consideration to the patient's current level of functioning, the nature of his symptoms, emotional and behavioral responses during the interview, level of literacy, observed level of motivation/effort, and the nature of the referral question. This battery was communicated to the psychometrist. Communication between Dr. Nicole Kindred and the psychometrist was ongoing throughout the evaluation and Dr. Nicole Kindred was immediately accessible at all times. Dr. Nicole Kindred provided supervision to the technician on the date of this service, to the extent necessary to assure the quality of all services provided.    Mr. Brandow will return in approximately one week for an interactive feedback session with Dr. Nicole Kindred, at which time test performance, clinical impressions, and treatment recommendations will be reviewed in detail. The patient understands he can contact our office should he require our assistance before this time.   A total of 130 minutes of billable time were spent with Jonathan Powers by the technician, including test administration and scoring time. Billing for these services is reflected in Dr. Les Pou note.   This note reflects time spent with the psychometrician and does not include test scores, clinical history, or any interpretations made by Dr. Nicole Kindred. The full report will follow in a separate note.
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
## 2020-10-05 NOTE — Progress Notes (Signed)
[facility]  Patient Name: Jonathan Powers MRN: [id number] Date of Birth: 05-05-1950 Age: 70 y.o. Education: 18 years  Measurement properties of test scores: IQ, Index, and Standard Scores (SS): Mean = 100; Standard Deviation = 15 Scaled Scores (Ss): Mean = 10; Standard Deviation = 3 Z scores (Z): Mean = 0; Standard Deviation = 1 T scores (T); Mean = 50; Standard Deviation = 10  TEST SCORES:    Note: This summary of test scores accompanies the interpretive report and should not be interpreted by unqualified individuals or in isolation without reference to the report. Test scores are relative to age, gender, and educational history as available and appropriate.   Performance Validity        "A" Random Letter Test Raw  Descriptor      Errors 2 Within Expectation  The Dot Counting Test: 13 Within Expectation      Embedded Measures: Raw  Descriptor      NAB Effort Index 2 Within Expectation      Expected Functioning        Wide Range Achievement Test: Standard/Scaled Score Percentile      Word Reading 107 68      Reynolds Intellectual Screening Test Standard/T-score Percentile      Guess What 43 25      Odd Item Out 33 5  RIST Index 84 14      Attention/Processing Speed        Neuropsychological Assessment Battery (Attention Module, Form 1): Scaled/T-score Percentile      Digits Forward 49 46      Digits Backwards 45 31      Numbers & Letters A Speed 45 31      Numbers & Letters A Accuracy 45 31      Numbers & Letters A Efficiency 45 31      Numbers & Letters B Efficiency 52 58      Numbers & Letters C Efficiency 31 3      Numbers & Letters D Efficiency 47 38      Numbers & Letters D Disruption 52 58      Language        Neuropsychological Assessment Battery (Language Module, Form 1): T-score Percentile      Naming   (31) 55 69      Verbal Fluency:  T Score Percentile      Controlled Oral Word Association (F-A-S) 39 14       Semantic Fluency (Animals) 31 3      Memory:        Neuropsychological Assessment Battery (Memory Module, Form 1): T-score/Standard Score Percentile  Memory Index (MEM): 69 2      List Learning           List A Immediate Recall   (4 , 4 , 6) 32 4         List B Immediate Recall   (4) 47 38         List A Short Delayed Recall   (2) 23 <1         List A Long Delayed Recall   (0) 21 <1         List A Percent Retention   (0 %) --- <1         List A Long Delayed Yes/No Recognition Hits   (9) --- 12         List A Long Delayed Yes/No Recognition False Alarms   (  7) --- 12         List A Recognition Discriminability Index --- 5      Shape Learning           Immediate Recognition   (7 , 7 , 6) 61 86         Delayed Recognition   (8) 67 96         Percent Retention   (133 %) --- 86         Delayed Forced-Choice Recognition Hits   (6) --- 9         Delayed Forced-Choice Recognition False Alarms   (1) --- 50         Delayed Forced-Choice Recognition Discriminability --- 21      Story Learning           Immediate Recall   (15, 23) 31 3         Delayed Recall   (14) 30 2         Percent Retention   (61 %) --- 8      Daily Living Memory            Immediate Recall   (15, 10) 25 1          Delayed Recall   (0, 0) 19 <1          Percent Retention (0 %) --- <1          Recognition Hits   (7) --- 8      Visuospatial/Constructional Functioning        Neuropsychological Assessment Battery (Visuospatial Module) T-score Percentile      Visual Discrimination 59 82      Figure Copy 60 84      Executive Functioning        Modified [place] Card Sorting Test (MWCST): Standard/T-Score Percentile      Number of Categories Correct 41 18      Number of Perseverative Errors 49 46      Number of Total Errors 44 27      Percent Perseverative Errors 53 62  Executive Function Composite 92 30      Trail Making Test: T-Score Percentile      Part A 58 79      Part B 50 50      [place] Diagnostic Aphasia  Exam: Raw Score Scaled Score      Complex Ideational Material 11 9      Clock Drawing Raw Score Descriptor      Command 10 WNL      Rating Scales         Raw Score Descriptor  Beck Depression Inventory - II 19 Mild  Beck Anxiety Inventory 18 Moderate      Clinical Dementia Rating Raw Score Descriptor      Sum of Boxes 2.0 Mild Cognitive Impairment      Global Score 0.5 MCI    Li Fragoso V. Nicole Kindred PsyD, [place] Clinical Neuropsychologist

## 2020-10-08 ENCOUNTER — Ambulatory Visit (INDEPENDENT_AMBULATORY_CARE_PROVIDER_SITE_OTHER): Payer: Medicare Other | Admitting: Counselor

## 2020-10-08 ENCOUNTER — Encounter: Payer: Self-pay | Admitting: Counselor

## 2020-10-08 ENCOUNTER — Other Ambulatory Visit: Payer: Self-pay

## 2020-10-08 DIAGNOSIS — G3184 Mild cognitive impairment, so stated: Secondary | ICD-10-CM

## 2020-10-08 HISTORY — DX: Mild cognitive impairment of uncertain or unknown etiology: G31.84

## 2020-10-08 NOTE — Progress Notes (Signed)
[facility]  Patient Name: Jonathan Powers MRN: [id number] Date of Birth: 06/11/1950 Age: 70 y.o. Education: 47 years  Clinical Impressions  Jonathan Powers is a 70 y.o., right-hand dominant, married man with a history of hypothyroidism, HLD, OSA (not on CPAP the past year), low T, and cognitive changes over the past 8 months or so. The main changes that he notices involve difficulties with word retrieval and delayed recall. He is functioning adequately, but there are subtle changes in some functional areas, he has a harder time placing orders at work, he feels that things are not as automatic, and he is less decisive than in the past. He is working as an Transport planner and thinks that the changes have been noticed by the surgeon whom he works with, although he denied they have ever been commented on. He stated that he feels as though he does just as well as he ever has in surgery and does not think that his performance is affected in any way.   Findings from neuropsychological testing are consistent with fairly good capacities in most areas although unfortunately, Jonathan Powers is demonstrating less than expected memory performance and extremely low performance at an index level. The pattern shows difficulties with acquisiton and retention of unstructured verbal information over time and better retention of structured verbal information. He also did quite well on visual memory measures. Nonetheless, findings are concerning for a developing storage problem. There were no clear areas of decrement in any other domain. He is reporting a mild level of depressive symptoms and a moderate level of anxiety symptoms on self-rating scales.  Jonathan Powers is  thus demonstrating neuropsychological test findings that are concerning for a developing memory storage problem and consistent with a clinical diagnosis of amnestic mild cognitive impairment. He should be followed over  time. His OSA and mood issues could be contributing but I think them unlikely sole etiologies and I am concerned that he is at risk for decline. I will instruct him about lifestyle changes for primary prevention. While it is not clear to be that he is necessarily unsafe or incapacitated with respect to his job duties, particularly given that he works very closely with a physician at all times, I do have concerns that he could become unsafe in the future given risk for progression, which will be a topic of discussion at the follow up.    Diagnostic Impressions: Amnestic mild cognitive impairment  Recommendations to be discussed with patient  Your performance and presentation on assessment today were consistent with less than expected memory performance. You had the most difficulty on measures involving learning and recall of verbal information. 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  The major difference between mild cognitive impairment (MCI) and dementia is in severity and potential prognosis. Once someone reaches a level of severity adequate to be diagnosed with a dementia, there is usually progression over time, though this may be years. On the other hand, mild cognitive impairment, while a significant risk for dementia in future, does not always progress to dementia, and in some instances stays the same or can even revert to normal. It is important to realize that if MCI is due to underlying Alzheimer's disease, it will most likely progress to dementia eventually. The rate of conversion to Alzheimer's dementia from amnestic MCI is about 15% per year versus the general population risk of conversion of 2% per year.  While it is possible that your increased irritability, OSA, or other factors could be affecting your memory, I am concerned that this could be due to an underlying condition and that there may be progression over  time. The time to focus on preventative strategies is now, and there are many things that you can do to reduce your risk of progression to dementia in the future.  We discussed the fact that having any cognitive impairment is potentially a risk factor in terms of your occupation. 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 You also are never operating without your physician present. 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   There is now good quality evidence from at least one large scale study that a modified mediterranean diet may help slow cognitive decline. This is known as the "MIND" diet. The Mind diet is not so much a specific diet as it is a set of recommendations for things that you should and should not eat.   Foods that are ENCOURAGED on the MIND Diet:  Green, leafy vegetables: Aim for six or more servings per week. This includes kale, spinach, cooked greens and salads.  All other vegetables: Try to eat another vegetable in addition to the green leafy vegetables at least once a day. It is best to choose non-starchy vegetables because they have a lot of nutrients with a low number of calories.  Berries: Eat berries at least twice a week. There is a plethora of research on strawberries, and other berries such as blueberries, raspberries and blackberries have also been found to have antioxidant and brain health benefits.  Nuts: Try to get five servings of nuts or more each week. The creators of the [place] don't specify what kind of nuts to consume, but it is probably best to vary the type of nuts you eat to obtain a variety of nutrients. Peanuts are a legume and do not fall into this category.  Olive oil: Use olive oil as your main cooking oil. There may be  other heart-healthy alternatives such as algae oil, though there is not yet sufficient research upon which to base a formal recommendation.  Whole grains: Aim for at least three servings daily. Choose minimally processed grains like oatmeal, quinoa, brown rice, whole-wheat pasta and 100% whole-wheat bread.  Fish: Eat fish at least once a week. It is best to choose fatty fish like salmon, sardines, trout, tuna and mackerel for their high amounts of omega-3 fatty acids.  Beans: Include beans in at least four meals every week. This includes all beans, lentils and soybeans.  Poultry: Try to eat chicken or [place] at least twice a week. Note that fried chicken is not encouraged on the MIND diet.  Wine: Aim for no more than one glass of alcohol daily. Both red and white wine may benefit the brain. However, much research has focused on the red wine compound resveratrol, which may help protect against Alzheimer's disease.  Foods that are DISCOURAGED on the MIND Diet: Butter and margarine: Try to eat less than 1 tablespoon (about 14 grams) daily. Instead, try using olive oil as your primary cooking fat, and dipping your bread in olive oil with herbs.  Cheese: The MIND diet recommends limiting your cheese consumption  to less than once per week.  Red meat: Aim for no more than three servings each week. This includes all beef, pork, lamb and products made from these meats.  [place] food: The MIND diet highly discourages fried food, especially the kind from fast-food restaurants. Limit your consumption to less than once per week.  Pastries and sweets: This includes most of the processed junk food and desserts you can think of. Ice cream, cookies, brownies, snack cakes, donuts, candy and more. Try to limit these to no more than four times a week.  Exercise is one of the best medicines for promoting health and maintaining cognitive fitness at all stages in life. Exercise probably has the largest documented effect on  brain health and performance of any lifestyle intervention. Studies have shown that even previously sedentary individuals who start exercising as late as age 64 show a significant survival benefit as compared to their non-exercising peers. In the [place], the current guidelines are for 30 minutes of moderate exercise per day, but increasing your activity level less than that may also be helpful. You do not have to get your 30 minutes of exercise in one shot and exercising for short periods of time spread throughout the day can be helpful. Go for several walks, learn to dance, or do something else you enjoy that gets your body moving. Of course, if you have an underlying medical condition or there is any question about whether it is safe for you to exercise, you should consult a medical treatment provider prior to beginning exercise.   While I am not a medical treatment provider, I would let you know that there are no symptomatically efficacious FDA approved medications for mild cognitive impairment, and therefore I think it is premature for medical treatment of your memory loss.  You should return to clinic in 18-24 months or sooner as clinically indicated to monitor for any changes.   Test Findings  Test scores are summarized in additional documentation associated with this encounter. Test scores are relative to age, gender, and educational history as available and appropriate. There were no concerns about performance validity as all findings fell within normal expectations.   General Intellectual Functioning/Achievement:  Performance on single word reading was toward the upper end of the average range.  Performance on the RIST index was low average with a low average score on the more verbally oriented subtest and unusually low performance on the more visually oriented subtest.  As per technician, the patient had some word finding problems on the RIST, and I think this may be an underestimate of  his level of prior ability.  Attention and Processing Efficiency Performance was within normal limits on measures of attention/processing efficiency despite a related low score. On verbally mediated indicators, he demonstrated average digit repetition forward and average digit repetition backward. Performance on timed measures of processing speed/efficiency was average on tasks involving letter cancellation, letter counting, and on a challenging measure involving alternating attention between cancellation and calculation. He did demonstrate an unusually low score on an analogous indicator involving mental math under time pressure. This could relate to some minor dyscalculia, although overall performance in this domain is not abnormal.  Language: Performance on visual object confrontation naming was errorless. Generation of words in response to the letters F-8-S was low average. Generation of animals in 1 minute was unusually low.  Visuospatial Function: Measures of visuospatial/constructional abilities suggest a good capacities, with high average scores on perceptual and constructional indicators.  Learning  and Memory: Performance on memory measures fell at an extremely low level overall, which is less than expected for an individual of Jonathan Powers overall ability. The profile shows difficulties with acquisition and retention of verbal information and much better visual memory. The findings are concerning for a developing storage issue.  In the verbal realm, Jonathan Powers learned 4, 6 and 6 words of a 12 item word list across 3 learning trials, which is unusually low.  He retained 2 words on short delayed recall and 0 words on long delayed recall, which are extremely low scores. Recognition discriminability for words from the list versus distractor items was unusually low. Memory for short story was unusually low, although his retention was a bit better (in the unusually low range) with unusually low  delayed recall. Memory for brief daily living type information was extremely low on immediate and delayed recall with unusually low recognition.  In the visual realm, immediate recall of a series of designs that are difficult to verbally encode was 7, 7, and 6 designs of a possible 9, which is high average.  His delayed recognition was very good, at 8 designs, generating a superior score.  Recognition discriminability for the designs was low average.  Executive Functions: Executive indicators suggested reasonable capacities, with mainly average scores.  The executive function composition of the modified [place] card sorting test was average, with comparable adequate scores on categories completed and perseverative errors.  Reasoning with verbal information was average on the Complex Ideational Material. Clock drawing was within normal limits, with good face formation, numbering, and hand placement. Alternating sequencing of numbers and letters of the alphabet was average. Generation of words in response to the letters F-A-S was low average.  Rating Scale(s): Jonathan Powers reported mild level of depressive symptoms and a moderate level of anxiety symptoms on self-report screening measures. These may somewhat overestimate his symptoms, because a number of issues he reported (e.g., not being able to focus on things, difficulty with decision making, etc.) may be related to his cognitive disorder.  Viviano Simas Nicole Kindred, PsyD, ABN Clinical Neuropsychologist  Coding and Compliance  Billing below reflects technician time, my direct face-to-face time with the patient, time spent in test administration, and time spent in professional activities including but not limited to: neuropsychological test interpretation, integration of neuropsychological test data with clinical history, report preparation, treatment planning, care coordination, and review of diagnostically pertinent medical history or studies.   Services  associated with this encounter: Clinical Interview [phone]) plus 180 minutes ([id number]; Neuropsychological Evaluation by Professional)  130 minutes ([id number]; Neuropsychological Testing by Technician)

## 2020-10-08 NOTE — Progress Notes (Signed)
NEUROPSYCHOLOGY FEEDBACK NOTE [facility]  Feedback Note: I met with Jonathan Powers to review the findings resulting from his neuropsychological evaluation. Since the last appointment, he has been about the same. Time was spent reviewing the impressions and recommendations that are detailed in the evaluation report. We discussed impression of amnestic mild cognitive impairment, at risk for decline. I suggested that he return for reevaluation in 18 to 24 months. 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 He clarified that he is having no difficulties procedurally whatsoever in surgery. He is also never operating alone and is always with Dr. Durward Fortes so there is a check on his performance. I was candid with him that it is not clear that he is incapacitated in any way or unsafe to operate, although that may happen in the future, as I am concerned this is a progressive condition. I shared my recommendation that he consider retiring sooner rather than later. 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 Discussed primary prevention with lifestyle changes as reflected in the patient instructions. I took time to explain the findings and answer all the patient's questions. I encouraged Jonathan Powers to contact me should he have any further questions or if further follow up is desired.   Current Medications and Medical History   Current Outpatient Medications  Medication Sig Dispense Refill   acetaminophen (TYLENOL) 500 MG tablet Take 1,000 mg by mouth every 6 (six) hours as needed for moderate pain.      cetirizine (ZYRTEC) 10 MG tablet Take 10 mg by mouth daily.     levothyroxine (SYNTHROID) 175 MCG tablet TAKE 1 TABLET BY MOUTH EVERY DAY BEFORE BREAKFAST 90 tablet 0   olmesartan-hydrochlorothiazide (BENICAR HCT) 40-25 MG tablet TAKE 1 TABLET BY MOUTH EVERY DAY 90  tablet 0   phentermine 30 MG capsule TAKE 1 CAPSULE BY MOUTH EVERY DAY IN THE MORNING 30 capsule 3   promethazine-dextromethorphan (PROMETHAZINE-DM) 6.25-15 MG/5ML syrup Take 5 mLs by mouth 4 (four) times daily as needed for cough. 118 mL 0   rosuvastatin (CRESTOR) 5 MG tablet TAKE ONE TABLET BY MOUTH DAILY AT BEDTIME 90 tablet 1   tadalafil (CIALIS) 5 MG tablet Take 5 mg by mouth at bedtime.     Testosterone 1.62 % GEL Apply 3 Pump topically daily.     traZODone (DESYREL) 100 MG tablet Take 1 tablet (100 mg total) by mouth at bedtime as needed for sleep. (Patient taking differently: Take 100 mg by mouth at bedtime as needed for sleep. Take 1/2 tablet to 1 tablet) 90 tablet 1   valACYclovir (VALTREX) 1000 MG tablet Take 1 tablet (1,000 mg total) by mouth 2 (two) times daily. (Patient taking differently: Take 1,000 mg by mouth as needed.) 14 tablet 5   valACYclovir (VALTREX) 1000 MG tablet Take 1 tablet (1,000 mg total) by mouth 2 (two) times daily. 30 tablet 0   No current facility-administered medications for this visit.    Patient Active Problem List   Diagnosis Date Noted   Amnestic MCI (mild cognitive impairment with memory loss) 10/08/2020   Neck pain 08/26/2020   Bronchitis due to COVID-19 virus 08/13/2020   Sinusitis 07/24/2020   Memory deficit 06/29/2020   Chronic hyperglycemia 06/29/2020   Generalized abdominal tenderness without rebound tenderness 07/16/2019   Failed total hip arthroplasty ([place]) 03/06/2019   Recurrent genital HSV (herpes  simplex virus) infection 11/12/2018   Other specified hypothyroidism 11/11/2018   Insomnia 11/10/2018   GERD (gastroesophageal reflux disease) 11/10/2018   Seasonal allergies 11/10/2018   Recurrent oral herpes simplex 09/23/2018   Age-related osteoporosis with current pathological fracture 03/11/2018   Flexural eczema 07/24/2017   Routine general medical examination at a health care facility 10/12/2016   Chronic right-sided low back pain  without sciatica 07/21/2016   Low testosterone 07/21/2016   Superior mesenteric artery aneurysm ([place]) 07/12/2015   Left thyroid nodule    OSA (obstructive sleep apnea) 01/27/2013   Hypothyroid    Symptomatic PVCs 09/06/2012   Dyslipidemia, goal LDL below 130 09/06/2012   BPH (benign prostatic hyperplasia) 09/06/2012   Essential hypertension 05/17/2006   ALLERGIC RHINITIS 05/17/2006   OSTEOARTHRITIS 05/17/2006    Mental Status and Behavioral Observations  Jonathan Powers presented on time to the present encounter and was alert and generally oriented. Speech was normal in rate, rhythm, volume, and prosody. Self-reported mood was "fine" and affect was euthymic. Thought process was logical and goal oriented, although there was some repeating within the clinical encounter. Thought content was appropriate to the topics discussed. There were no safety concerns identified at today's encounter, such as thoughts of harming self or others.   Plan  Feedback provided regarding the patient's neuropsychological evaluation. 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 Jonathan Powers was encouraged to contact me if any questions arise or if further follow up is desired.   Viviano Simas Nicole Kindred, PsyD, ABN Clinical Neuropsychologist  Service(s) Provided at This Encounter: 40 minutes [phone]; Psychotherapy with patient/family)

## 2020-10-08 NOTE — Patient Instructions (Signed)
Your performance and presentation on assessment today were consistent with less than expected memory performance.  You had the most difficulty on measures involving learning and recall of verbal information.  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  The major difference between mild cognitive impairment (MCI) and dementia is in severity and potential prognosis. Once someone reaches a level of severity adequate to be diagnosed with a dementia, there is usually progression over time, though this may be years. On the other hand, mild cognitive impairment, while a significant risk for dementia in future, does not always progress to dementia, and in some instances stays the same or can even revert to normal. It is important to realize that if MCI is due to underlying Alzheimer's disease, it will most likely progress to dementia eventually. The rate of conversion to Alzheimer's dementia from amnestic MCI is about 15% per year versus the general population risk of conversion of 2% per year.   While it is possible that your increased irritability, OSA, or other factors could be affecting your memory, I am concerned that this could be due to an underlying condition and that there may be progression over time. The time to focus on preventative strategies is now, and there are many things that you can do to reduce your risk of progression to dementia in the future.  We discussed the fact that having any cognitive impairment is potentially a risk factor in terms of your occupation. 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 You also are never operating without your physician present. 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   There is now good quality evidence from at least one large scale study that a modified mediterranean diet may help slow cognitive decline. This is known as the "MIND" diet. The Mind diet is not so much a specific diet as it is a set of recommendations for things that you should and should not eat.   Foods that are ENCOURAGED on the MIND Diet:  Green, leafy vegetables: Aim for six or more servings per week. This includes kale, spinach, cooked greens and salads.  All other vegetables: Try to eat another vegetable in addition to the green leafy vegetables at least once a day. It is best to choose non-starchy vegetables because they have a lot of nutrients with a low number of calories.  Berries: Eat berries at least twice a week. There is a plethora of research on strawberries, and other berries such as blueberries, raspberries and blackberries have also been found to have antioxidant and brain health benefits.  Nuts: Try to get five servings of nuts or more each week. The creators of the [place] don't specify what kind of nuts to consume, but it is probably best to vary the type of nuts you eat to obtain a variety of nutrients. Peanuts are a legume and do not fall into this category.  Olive oil: Use olive oil as your main cooking oil. There may be other heart-healthy alternatives such as algae oil, though there is not yet sufficient research upon which to base a formal recommendation.  Whole grains: Aim for at least three servings daily. Choose minimally processed grains like oatmeal,  quinoa, brown rice, whole-wheat pasta and 100% whole-wheat bread.  Fish: Eat fish at least once a week. It is best to choose fatty fish like salmon, sardines, trout, tuna and mackerel for their high amounts of omega-3 fatty acids.  Beans: Include beans in at least four meals every week. This includes all beans, lentils and soybeans.  Poultry: Try to eat chicken or [place] at least twice a week. Note that  fried chicken is not encouraged on the MIND diet.  Wine: Aim for no more than one glass of alcohol daily. Both red and white wine may benefit the brain. However, much research has focused on the red wine compound resveratrol, which may help protect against Alzheimer's disease.  Foods that are DISCOURAGED on the MIND Diet: Butter and margarine: Try to eat less than 1 tablespoon (about 14 grams) daily. Instead, try using olive oil as your primary cooking fat, and dipping your bread in olive oil with herbs.  Cheese: The MIND diet recommends limiting your cheese consumption to less than once per week.  Red meat: Aim for no more than three servings each week. This includes all beef, pork, lamb and products made from these meats.  [place] food: The MIND diet highly discourages fried food, especially the kind from fast-food restaurants. Limit your consumption to less than once per week.  Pastries and sweets: This includes most of the processed junk food and desserts you can think of. Ice cream, cookies, brownies, snack cakes, donuts, candy and more. Try to limit these to no more than four times a week.   Exercise is one of the best medicines for promoting health and maintaining cognitive fitness at all stages in life. Exercise probably has the largest documented effect on brain health and performance of any lifestyle intervention. Studies have shown that even previously sedentary individuals who start exercising as late as age 97 show a significant survival benefit as compared to their non-exercising peers. In the [place], the current guidelines are for 30 minutes of moderate exercise per day, but increasing your activity level less than that may also be helpful. You do not have to get your 30 minutes of exercise in one shot and exercising for short periods of time spread throughout the day can be helpful. Go for several walks, learn to dance, or do something else you enjoy that gets your body moving. Of  course, if you have an underlying medical condition or there is any question about whether it is safe for you to exercise, you should consult a medical treatment provider prior to beginning exercise.   While I am not a medical treatment provider, I would let you know that there are no symptomatically efficacious FDA approved medications for mild cognitive impairment, and therefore I think it is premature for medical treatment of your memory loss.   You should return to clinic in 18-24 months or sooner as clinically indicated to monitor for any changes.

## 2020-10-19 ENCOUNTER — Ambulatory Visit: Payer: Medicare Other | Admitting: Physician Assistant

## 2020-10-27 ENCOUNTER — Encounter: Payer: Self-pay | Admitting: Physician Assistant

## 2020-10-27 ENCOUNTER — Ambulatory Visit (INDEPENDENT_AMBULATORY_CARE_PROVIDER_SITE_OTHER): Payer: Medicare Other | Admitting: Physician Assistant

## 2020-10-27 ENCOUNTER — Other Ambulatory Visit: Payer: Self-pay

## 2020-10-27 VITALS — BP 121/70 | HR 68 | Resp 20 | Ht 70.0 in | Wt 277.0 lb

## 2020-10-27 DIAGNOSIS — G3184 Mild cognitive impairment, so stated: Secondary | ICD-10-CM

## 2020-10-27 NOTE — Patient Instructions (Addendum)
It was a pleasure to see you today at our office.   Recommendations:  Follow up in 6 months    RECOMMENDATIONS FOR ALL PATIENTS WITH MEMORY PROBLEMS: 1. Continue to exercise (Recommend 30 minutes of walking everyday, or 3 hours every week) 2. Increase social interactions - continue going to [place] and enjoy social gatherings with friends and family 3. Eat healthy, avoid fried foods and eat more fruits and vegetables 4. Maintain adequate blood pressure, blood sugar, and blood cholesterol level. Reducing the risk of stroke and cardiovascular disease also helps promoting better memory. 5. Avoid stressful situations. Live a simple life and avoid aggravations. Organize your time and prepare for the next day in anticipation. 6. Sleep well, avoid any interruptions of sleep and avoid any distractions in the bedroom that may interfere with adequate sleep quality 7. Avoid sugar, avoid sweets as there is a strong link between excessive sugar intake, diabetes, and cognitive impairment We discussed the Mediterranean diet, which has been shown to help patients reduce the risk of progressive memory disorders and reduces cardiovascular risk. This includes eating fish, eat fruits and green leafy vegetables, nuts like almonds and hazelnuts, walnuts, and also use olive oil. Avoid fast foods and fried foods as much as possible. Avoid sweets and sugar as sugar use has been linked to worsening of memory function.  There is always a concern of gradual progression of memory problems. If this is the case, then we may need to adjust level of care according to patient needs. Support, both to the patient and caregiver, should then be put into place.     FALL PRECAUTIONS: Be cautious when walking. Scan the area for obstacles that may increase the risk of trips and falls. When getting up in the mornings, sit up at the edge of the bed for a few minutes before getting out of bed. Consider elevating the bed at the head end to  avoid drop of blood pressure when getting up. Walk always in a well-lit room (use night lights in the walls). Avoid area rugs or power cords from appliances in the middle of the walkways. Use a walker or a cane if necessary and consider physical therapy for balance exercise. Get your eyesight checked regularly.  FINANCIAL OVERSIGHT: Supervision, especially oversight when making financial decisions or transactions is also recommended.  HOME SAFETY: Consider the safety of the kitchen when operating appliances like stoves, microwave oven, and blender. Consider having supervision and share cooking responsibilities until no longer able to participate in those. Accidents with firearms and other hazards in the house should be identified and addressed as well.   ABILITY TO BE LEFT ALONE: If patient is unable to contact 911 operator, consider using LifeLine, or when the need is there, arrange for someone to stay with patients. Smoking is a fire hazard, consider supervision or cessation. Risk of wandering should be assessed by caregiver and if detected at any point, supervision and safe proof recommendations should be instituted.  MEDICATION SUPERVISION: Inability to self-administer medication needs to be constantly addressed. Implement a mechanism to ensure safe administration of the medications.   DRIVING: Regarding driving, in patients with progressive memory problems, driving will be impaired. We advise to have someone else do the driving if trouble finding directions or if minor accidents are reported. Independent driving assessment is available to determine safety of driving.   If you are interested in the driving assessment, you can contact the following:  [employer] in [place]  [facility] [phone]  [facility] [phone] or [phone]    [place] refers to food  and lifestyle choices that are based on the traditions of countries located on the [employer]. This way of eating has been shown to help prevent certain conditions and improve outcomes for people who have chronic diseases, like kidney disease and heart disease. What are tips for following this plan? Lifestyle  Cook and eat meals together with your family, when possible. Drink enough fluid to keep your urine clear or pale yellow. Be physically active every day. This includes: Aerobic exercise like running or swimming. Leisure activities like gardening, walking, or housework. Get 7-8 hours of sleep each night. If recommended by your health care provider, drink red wine in moderation. This means 1 glass a day for nonpregnant women and 2 glasses a day for men. A glass of wine equals 5 oz (150 mL). Reading food labels  Check the serving size of packaged foods. For foods such as rice and pasta, the serving size refers to the amount of cooked product, not dry. Check the total fat in packaged foods. Avoid foods that have saturated fat or trans fats. Check the ingredients list for added sugars, such as corn syrup. Shopping  At the grocery store, buy most of your food from the areas near the walls of the store. This includes: Fresh fruits and vegetables (produce). Grains, beans, nuts, and seeds. Some of these may be available in unpackaged forms or large amounts (in bulk). Fresh seafood. Poultry and eggs. Low-fat dairy products. Buy whole ingredients instead of prepackaged foods. Buy fresh fruits and vegetables in-season from local farmers markets. Buy frozen fruits and vegetables in resealable bags. If you do not have access to quality fresh seafood, buy precooked frozen shrimp or canned fish, such as tuna, salmon, or sardines. Buy small amounts of raw or cooked vegetables, salads, or olives from the deli or salad bar at your store. Stock your pantry so you always have certain foods on hand,  such as olive oil, canned tuna, canned tomatoes, rice, pasta, and beans. Cooking  Cook foods with extra-virgin olive oil instead of using butter or other vegetable oils. Have meat as a side dish, and have vegetables or grains as your main dish. This means having meat in small portions or adding small amounts of meat to foods like pasta or stew. Use beans or vegetables instead of meat in common dishes like chili or lasagna. Experiment with different cooking methods. Try roasting or broiling vegetables instead of steaming or sauteing them. Add frozen vegetables to soups, stews, pasta, or rice. Add nuts or seeds for added healthy fat at each meal. You can add these to yogurt, salads, or vegetable dishes. Marinate fish or vegetables using olive oil, lemon juice, garlic, and fresh herbs. Meal planning  Plan to eat 1 vegetarian meal one day each week. Try to work up to 2 vegetarian meals, if possible. Eat seafood 2 or more times a week. Have healthy snacks readily available, such as: Vegetable sticks with hummus. Greek yogurt. Fruit and nut trail mix. Eat balanced meals throughout the week. This includes: Fruit: 2-3 servings a day Vegetables: 4-5 servings a day Low-fat dairy: 2 servings a day Fish, poultry, or lean meat: 1 serving a day Beans and legumes: 2 or more servings a week Nuts and seeds: 1-2 servings a day Whole grains: 6-8 servings a day Extra-virgin olive oil: 3-4 servings a  day Limit red meat and sweets to only a few servings a month What are my food choices? Mediterranean diet Recommended Grains: Whole-grain pasta. Brown rice. Bulgar wheat. Polenta. Couscous. Whole-wheat bread. [place]. Vegetables: Artichokes. Beets. Broccoli. Cabbage. Carrots. Eggplant. Green beans. Chard. Kale. Spinach. Onions. Leeks. Peas. Squash. Tomatoes. Peppers. Radishes. Fruits: Apples. Apricots. Avocado. Berries. Bananas. Cherries. Dates. Figs. Grapes. Lemons. Melon. Oranges. Peaches. Plums.  Pomegranate. Meats and other protein foods: Beans. Almonds. Sunflower seeds. Pine nuts. Peanuts. [place]. Salmon. Scallops. Shrimp. [place]. Tilapia. Clams. Oysters. Eggs. Dairy: Low-fat milk. Cheese. Greek yogurt. Beverages: Water. Red wine. Herbal tea. Fats and oils: Extra virgin olive oil. Avocado oil. Grape seed oil. Sweets and desserts: [place] yogurt with honey. Baked apples. Poached pears. Trail mix. Seasoning and other foods: Basil. Cilantro. Coriander. Cumin. Mint. Parsley. Sage. Rosemary. Tarragon. Garlic. Oregano. Thyme. Pepper. Balsalmic vinegar. Tahini. Hummus. Tomato sauce. Olives. Mushrooms. Limit these Grains: Prepackaged pasta or rice dishes. Prepackaged cereal with added sugar. Vegetables: Deep fried potatoes (french fries). Fruits: Fruit canned in syrup. Meats and other protein foods: Beef. Pork. Lamb. Poultry with skin. Hot dogs. [place] Sal[place]. Dairy: Ice cream. Sour cream. Whole milk. Beverages: Juice. Sugar-sweetened soft drinks. Beer. Liquor and spirits. Fats and oils: Butter. [place]ola oil. Vegetable oil. Beef fat (tallow). Lard. Sweets and desserts: Cookies. Cakes. Pies. Candy. Seasoning and other foods: Mayonnaise. Premade sauces and marinades. The items listed may not be a complete list. Talk with your dietitian about what dietary choices are right for you. Summary The Mediterranean diet includes both food and lifestyle choices. Eat a variety of fresh fruits and vegetables, beans, nuts, seeds, and whole grains. Limit the amount of red meat and sweets that you eat. Talk with your health care provider about whether it is safe for you to drink red wine in moderation. This means 1 glass a day for nonpregnant women and 2 glasses a day for men. A glass of wine equals 5 oz (150 mL). This information is not intended to replace advice given to you by your health care provider. Make sure you discuss any questions you have with your health care provider. Document Released: 09/30/2015 Document  Revised: 11/02/2015 Document Reviewed: 09/30/2015 Elsevier Interactive Patient Education  2017 [employer].
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
## 2020-11-11 ENCOUNTER — Other Ambulatory Visit: Payer: Self-pay | Admitting: Internal Medicine

## 2020-11-11 DIAGNOSIS — E785 Hyperlipidemia, unspecified: Secondary | ICD-10-CM

## 2020-11-24 ENCOUNTER — Encounter: Payer: Self-pay | Admitting: Internal Medicine

## 2020-11-25 ENCOUNTER — Other Ambulatory Visit: Payer: Self-pay | Admitting: Internal Medicine

## 2020-11-25 DIAGNOSIS — G4730 Sleep apnea, unspecified: Secondary | ICD-10-CM

## 2020-11-25 DIAGNOSIS — G4700 Insomnia, unspecified: Secondary | ICD-10-CM

## 2020-11-25 MED ORDER — TRAZODONE HCL 100 MG PO TABS: 100.0000 mg | ORAL_TABLET | Freq: Every evening | ORAL | 1 refills | Status: DC | PRN

## 2020-12-16 ENCOUNTER — Other Ambulatory Visit: Payer: Self-pay | Admitting: Orthopaedic Surgery

## 2020-12-20 DIAGNOSIS — H04123 Dry eye syndrome of bilateral lacrimal glands: Secondary | ICD-10-CM | POA: Diagnosis not present

## 2020-12-20 DIAGNOSIS — H35033 Hypertensive retinopathy, bilateral: Secondary | ICD-10-CM | POA: Diagnosis not present

## 2020-12-20 DIAGNOSIS — H2513 Age-related nuclear cataract, bilateral: Secondary | ICD-10-CM | POA: Diagnosis not present

## 2020-12-20 DIAGNOSIS — H40013 Open angle with borderline findings, low risk, bilateral: Secondary | ICD-10-CM | POA: Diagnosis not present

## 2020-12-20 DIAGNOSIS — H43393 Other vitreous opacities, bilateral: Secondary | ICD-10-CM | POA: Diagnosis not present

## 2020-12-20 DIAGNOSIS — H0102A Squamous blepharitis right eye, upper and lower eyelids: Secondary | ICD-10-CM | POA: Diagnosis not present

## 2020-12-20 DIAGNOSIS — H0102B Squamous blepharitis left eye, upper and lower eyelids: Secondary | ICD-10-CM | POA: Diagnosis not present

## 2020-12-23 ENCOUNTER — Ambulatory Visit: Payer: Self-pay

## 2020-12-23 ENCOUNTER — Encounter: Payer: Self-pay | Admitting: Orthopaedic Surgery

## 2020-12-23 ENCOUNTER — Other Ambulatory Visit: Payer: Self-pay

## 2020-12-23 ENCOUNTER — Ambulatory Visit (INDEPENDENT_AMBULATORY_CARE_PROVIDER_SITE_OTHER): Payer: Medicare Other | Admitting: Orthopaedic Surgery

## 2020-12-23 DIAGNOSIS — G8929 Other chronic pain: Secondary | ICD-10-CM

## 2020-12-23 DIAGNOSIS — M545 Low back pain, unspecified: Secondary | ICD-10-CM

## 2020-12-23 NOTE — Progress Notes (Signed)
Office Visit Note   Patient: Jonathan Powers           Date of Birth: 07-12-1950           MRN: [id number] Visit Date: 12/23/2020              Requested by: Jonathan Lima, MD [address] [place],  [place] [address] PCP: Jonathan Lima, MD   Assessment & Plan: Visit Diagnoses:  1. Chronic low back pain, unspecified back pain laterality, unspecified whether sciatica present   2. Chronic right-sided low back pain without sciatica     Plan: Jonathan Powers has had a history of chronic low back pain with a recent exacerbation within the past 3 to 4 months.  He is experiencing significant pain in the morning when he first awakens and difficulty getting out of bed but also is experience numbness and tingling into the left lower extremity.  He did have an MRI scan of the lumbar spine in 2020 demonstrating diffuse degenerative changes and some areas of mild stenosis both central and foraminal.  He did see Dr. Ernestina Powers who felt at the time that he probably did not need any intervention.  Not had any recent injury or trauma.  Exam was relatively benign reflexes were intact he had excellent capillary refill to the toes.  There is no change on his films so I think it is worth repeating the MRI scan given his radicular discomfort.  Consider having Dr. Ernestina Powers evaluate him afterwards  Follow-Up Instructions: Return After MRI scan lumbar spine.   Orders:  Orders Placed This Encounter  Procedures   XR Lumbar Spine 2-3 Views   MR Lumbar Spine w/o contrast   No orders of the defined types were placed in this encounter.     Procedures: No procedures performed   Clinical Data: No additional findings.   Subjective: Chief Complaint  Patient presents with   Lower Back - Pain  Recent exacerbation of chronic low back pain in the last several months without history of injury or trauma.  No changes in bowel or bladder but is experiencing left lower extremity radiculopathy.  Mostly low back pain.   Considerable stiffness when he first wakes up in the morning.  HPI  Review of Systems   Objective: Vital Signs: There were no vitals taken for this visit.  Physical Exam Constitutional:      Appearance: He is well-developed.  Eyes:     Pupils: Pupils are equal, round, and reactive to light.  Pulmonary:     Effort: Pulmonary effort is normal.  Skin:    General: Skin is warm and dry.  Neurological:     Mental Status: He is alert and oriented to person, place, and time.  Psychiatric:        Behavior: Behavior normal.    Ortho Exam awake alert and oriented x3.  Comfortable sitting.  Straight leg raise negative bilaterally.  Reflexes were symmetrical at both knees and ankles.  Excellent capillary refill to toes.  Feet were warm.  +1 pulses.  No calf pain or.  Bilateral pes planus.  Painless range of motion of both hips.  Mild percussible tenderness lower lumbar spine.  No significant pain over the sacroiliac joints.  Walks without a limp.  Does have significant arthritis of his left knee with predominantly lateral compartment arthritis  Specialty Comments:  No specialty comments available.  Imaging: XR Lumbar Spine 2-3 Views  Result Date: 12/23/2020 Films of the lumbar spine  were obtained in 2 projections.  Might be slight retrolisthesis of L4 on 5 with posterior osteophytes and significant narrowing of the L4-5 disc space.  Not much change from prior films.  No scoliosis.  Diffuse calcification of the anterior longitudinal ligament.  There is calcification of the abdominal aorta but greatest diameter was about 28 mm.  No acute changes.  Films are consistent with osteoarthritis    PMFS History: Patient Active Problem List   Diagnosis Date Noted   Amnestic MCI (mild cognitive impairment with memory loss) 10/08/2020   Neck pain 08/26/2020   Bronchitis due to COVID-19 virus 08/13/2020   Sinusitis 07/24/2020   Memory deficit 06/29/2020   Chronic hyperglycemia 06/29/2020    Generalized abdominal tenderness without rebound tenderness 07/16/2019   Failed total hip arthroplasty (Start) 03/06/2019   Recurrent genital HSV (herpes simplex virus) infection 11/12/2018   Other specified hypothyroidism 11/11/2018   Insomnia 11/10/2018   GERD (gastroesophageal reflux disease) 11/10/2018   Seasonal allergies 11/10/2018   Recurrent oral herpes simplex 09/23/2018   Age-related osteoporosis with current pathological fracture 03/11/2018   Flexural eczema 07/24/2017   Routine general medical examination at a health care facility 10/12/2016   Chronic right-sided low back pain without sciatica 07/21/2016   Low testosterone 07/21/2016   Superior mesenteric artery aneurysm ([place]) 07/12/2015   Left thyroid nodule    OSA (obstructive sleep apnea) 01/27/2013   Hypothyroid    Symptomatic PVCs 09/06/2012   Dyslipidemia, goal LDL below 130 09/06/2012   BPH (benign prostatic hyperplasia) 09/06/2012   Essential hypertension 05/17/2006   ALLERGIC RHINITIS 05/17/2006   OSTEOARTHRITIS 05/17/2006   Past Medical History:  Diagnosis Date   ALLERGIC RHINITIS    Cancer ([place])    Melonoma back   CELLULITIS, LEG, RIGHT    Recurrent R hip cellulitis 1/08,3/08    Diverticulitis    GERD (gastroesophageal reflux disease)    Gout    Hashimoto's thyroiditis    Hypertension    Hypogonadism male    low T, a/w ED   Hypothyroid    goiters    Left thyroid nodule 2008   on [place], decrease size in 2011 [place]   Migraines    Atypical and ocular   OSTEOARTHRITIS    Seasonal allergies    Sleep apnea    cpap    Family History  Problem Relation Age of Onset   Arthritis Father    Heart disease Father    Diabetes Father    Vascular Disease Father        AoBifem   Arthritis Mother    Diabetes Mother    Multiple sclerosis Mother    Cancer Paternal Grandmother        "GI"   Cancer Paternal Grandfather        stomach cancer   Thyroid cancer Sister    Uterine cancer Sister     Past Surgical  History:  Procedure Laterality Date   CARPAL TUNNEL RELEASE Left 03/30/2017   Procedure: CARPAL TUNNEL RELEASE;  Surgeon: Garald Balding, MD;  Location: [facility];  Service: Orthopedics;  Laterality: Left;   CHOLECYSTECTOMY  2004   CTR Bilateral [place]   FOOT FUSION Left 06/22/2017   HEMORRHOID SURGERY N/A 03/30/2017   Procedure: HEMORRHOIDECTOMY;  Surgeon: Coralie Keens, MD;  Location: [facility];  Service: General;  Laterality: N/A;   open reduction L little finger Left 1970   Repair digital thumb, left Left 1990  Right shoulder cuff repair Right 09/15/11   Right shoulder SAD, DCR Right 02/05/11   TOTAL HIP ARTHROPLASTY Left 07/26/06   TOTAL HIP ARTHROPLASTY Right 1999   TOTAL HIP REVISION Left 03/07/2019   Procedure: LEFT TOTAL HIP REVISION POSTERIOR APPROACH;  Surgeon: Frederik Pear, MD;  Location: [facility];  Service: Orthopedics;  Laterality: Left;   Social History   Occupational History   Occupation: Orthopedic PA    Employer: [employer]  Tobacco Use   Smoking status: Never   Smokeless tobacco: Never  Vaping Use   Vaping Use: Never used  Substance and Sexual Activity   Alcohol use: Yes    Alcohol/week: 0.0 standard drinks    Comment: rare   Drug use: No   Sexual activity: Yes     Garald Balding, MD   Note - This record has been created using Editor, commissioning.  Chart creation errors have been sought, but may not always  have been located. Such creation errors do not reflect on  the standard of medical care.

## 2020-12-24 ENCOUNTER — Other Ambulatory Visit: Payer: Self-pay | Admitting: Internal Medicine

## 2020-12-24 DIAGNOSIS — E039 Hypothyroidism, unspecified: Secondary | ICD-10-CM

## 2020-12-30 ENCOUNTER — Other Ambulatory Visit: Payer: Self-pay | Admitting: Cardiology

## 2020-12-30 DIAGNOSIS — I10 Essential (primary) hypertension: Secondary | ICD-10-CM

## 2021-01-07 ENCOUNTER — Other Ambulatory Visit: Payer: Self-pay | Admitting: Cardiology

## 2021-01-07 DIAGNOSIS — I10 Essential (primary) hypertension: Secondary | ICD-10-CM

## 2021-01-09 ENCOUNTER — Other Ambulatory Visit: Payer: Self-pay

## 2021-01-09 ENCOUNTER — Ambulatory Visit
Admission: RE | Admit: 2021-01-09 | Discharge: 2021-01-09 | Disposition: A | Discharge status: Home / Self Care | Payer: Medicare Other | Source: Ambulatory Visit | Attending: Orthopaedic Surgery | Admitting: Orthopaedic Surgery

## 2021-01-09 DIAGNOSIS — G8929 Other chronic pain: Secondary | ICD-10-CM

## 2021-01-09 DIAGNOSIS — M48061 Spinal stenosis, lumbar region without neurogenic claudication: Secondary | ICD-10-CM | POA: Diagnosis not present

## 2021-01-09 DIAGNOSIS — M545 Low back pain, unspecified: Secondary | ICD-10-CM

## 2021-01-10 ENCOUNTER — Other Ambulatory Visit: Payer: Self-pay

## 2021-01-10 ENCOUNTER — Encounter: Payer: Self-pay | Admitting: Orthopaedic Surgery

## 2021-01-10 DIAGNOSIS — G8929 Other chronic pain: Secondary | ICD-10-CM

## 2021-01-10 NOTE — Progress Notes (Signed)
Please schedule referral to Dr Ernestina Patches

## 2021-01-20 ENCOUNTER — Other Ambulatory Visit (HOSPITAL_COMMUNITY): Payer: Self-pay

## 2021-02-01 ENCOUNTER — Ambulatory Visit: Payer: Medicare Other | Admitting: Physical Medicine and Rehabilitation

## 2021-02-05 ENCOUNTER — Other Ambulatory Visit: Payer: Self-pay | Admitting: Internal Medicine

## 2021-02-05 DIAGNOSIS — E785 Hyperlipidemia, unspecified: Secondary | ICD-10-CM

## 2021-02-09 ENCOUNTER — Encounter: Payer: Self-pay | Admitting: Internal Medicine

## 2021-02-09 ENCOUNTER — Telehealth: Payer: Self-pay | Admitting: Internal Medicine

## 2021-02-09 ENCOUNTER — Ambulatory Visit (INDEPENDENT_AMBULATORY_CARE_PROVIDER_SITE_OTHER): Payer: Medicare Other | Admitting: Internal Medicine

## 2021-02-09 ENCOUNTER — Other Ambulatory Visit: Payer: Self-pay

## 2021-02-09 VITALS — BP 126/82 | HR 50 | Temp 98.5°F | Resp 16 | Ht 70.0 in | Wt 268.0 lb

## 2021-02-09 DIAGNOSIS — I10 Essential (primary) hypertension: Secondary | ICD-10-CM

## 2021-02-09 DIAGNOSIS — R739 Hyperglycemia, unspecified: Secondary | ICD-10-CM | POA: Diagnosis not present

## 2021-02-09 DIAGNOSIS — J0100 Acute maxillary sinusitis, unspecified: Secondary | ICD-10-CM

## 2021-02-09 DIAGNOSIS — E785 Hyperlipidemia, unspecified: Secondary | ICD-10-CM

## 2021-02-09 DIAGNOSIS — E039 Hypothyroidism, unspecified: Secondary | ICD-10-CM | POA: Diagnosis not present

## 2021-02-09 DIAGNOSIS — N5201 Erectile dysfunction due to arterial insufficiency: Secondary | ICD-10-CM | POA: Diagnosis not present

## 2021-02-09 DIAGNOSIS — Z23 Encounter for immunization: Secondary | ICD-10-CM

## 2021-02-09 LAB — CBC WITH DIFFERENTIAL/PLATELET
Basophils Absolute: 0 10*3/uL (ref 0.0–0.1)
Basophils Relative: 0.4 % (ref 0.0–3.0)
Eosinophils Absolute: 0.1 10*3/uL (ref 0.0–0.7)
Eosinophils Relative: 1.8 % (ref 0.0–5.0)
HCT: 43.8 % (ref 39.0–52.0)
Hemoglobin: 15.3 g/dL (ref 13.0–17.0)
Lymphocytes Relative: 25 % (ref 12.0–46.0)
Lymphs Abs: 1.5 10*3/uL (ref 0.7–4.0)
MCHC: 34.9 g/dL (ref 30.0–36.0)
MCV: 87.3 fl (ref 78.0–100.0)
Monocytes Absolute: 0.7 10*3/uL (ref 0.1–1.0)
Monocytes Relative: 10.7 % (ref 3.0–12.0)
Neutro Abs: 3.8 10*3/uL (ref 1.4–7.7)
Neutrophils Relative %: 62.1 % (ref 43.0–77.0)
Platelets: 305 10*3/uL (ref 150.0–400.0)
RBC: 5.02 Mil/uL (ref 4.22–5.81)
RDW: 13.2 % (ref 11.5–15.5)
WBC: 6.1 10*3/uL (ref 4.0–10.5)

## 2021-02-09 LAB — BASIC METABOLIC PANEL
BUN: 11 mg/dL (ref 6–23)
CO2: 32 mEq/L (ref 19–32)
Calcium: 9.8 mg/dL (ref 8.4–10.5)
Chloride: 100 mEq/L (ref 96–112)
Creatinine, Ser: 1.03 mg/dL (ref 0.40–1.50)
GFR: 74.22 mL/min (ref >60.00)
Glucose, Bld: 101 mg/dL — ABNORMAL HIGH (ref 70–99)
Potassium: 3.8 mEq/L (ref 3.5–5.1)
Sodium: 138 mEq/L (ref 135–145)

## 2021-02-09 LAB — LIPID PANEL
Cholesterol: 133 mg/dL (ref 0–200)
HDL: 42.5 mg/dL (ref >39.00)
LDL Cholesterol: 62 mg/dL (ref 0–99)
NonHDL: 90.56
Total CHOL/HDL Ratio: 3
Triglycerides: 145 mg/dL (ref 0.0–149.0)
VLDL: 29 mg/dL (ref 0.0–40.0)

## 2021-02-09 LAB — HEMOGLOBIN A1C: Hgb A1c MFr Bld: 5.8 % (ref 4.6–6.5)

## 2021-02-09 LAB — TSH: TSH: 1.24 u[IU]/mL (ref 0.35–5.50)

## 2021-02-09 NOTE — Progress Notes (Signed)
Subjective:  Patient ID: Jonathan Powers, male    DOB: 22-Oct-1950  Age: 70 y.o. MRN: [id number]  CC: Hypertension and Hypothyroidism  This visit occurred during the SARS-CoV-2 public health emergency.  Safety protocols were in place, including screening questions prior to the visit, additional usage of staff PPE, and extensive cleaning of exam room while observing appropriate contact time as indicated for disinfecting solutions.    HPI Jonathan Powers presents for f/up -  He complains of a several week history of nasal congestion, facial pain, and thick yellow nasal phlegm.  He is active and denies dizziness, lightheadedness, palpitations, near-syncope, or edema.  He complains of chronic, unchanged fatigue.  Outpatient Medications Prior to Visit  Medication Sig Dispense Refill   acetaminophen (TYLENOL) 500 MG tablet Take 1,000 mg by mouth every 6 (six) hours as needed for moderate pain.      cetirizine (ZYRTEC) 10 MG tablet Take 10 mg by mouth daily.     levothyroxine (SYNTHROID) 175 MCG tablet TAKE 1 TABLET BY MOUTH EVERY DAY BEFORE BREAKFAST 90 tablet 0   rosuvastatin (CRESTOR) 5 MG tablet TAKE ONE TABLET BY MOUTH DAILY AT BEDTIME 90 tablet 0   tadalafil (CIALIS) 5 MG tablet Take 5 mg by mouth at bedtime.     Testosterone 1.62 % GEL Apply 3 Pump topically daily.     traZODone (DESYREL) 100 MG tablet Take 1 tablet (100 mg total) by mouth at bedtime as needed for sleep. 90 tablet 1   valACYclovir (VALTREX) 1000 MG tablet Take 1 tablet (1,000 mg total) by mouth 2 (two) times daily. (Patient taking differently: Take 1,000 mg by mouth as needed.) 14 tablet 5   valACYclovir (VALTREX) 1000 MG tablet Take 1 tablet (1,000 mg total) by mouth 2 (two) times daily. 30 tablet 0   olmesartan-hydrochlorothiazide (BENICAR HCT) 40-25 MG tablet TAKE 1 TABLET BY MOUTH EVERY DAY 90 tablet 0   phentermine 30 MG capsule TAKE 1 CAPSULE BY MOUTH EVERY DAY IN THE MORNING 30 capsule 3   No facility-administered  medications prior to visit.    ROS Review of Systems  Constitutional:  Positive for fatigue. Negative for chills, diaphoresis, fever and unexpected weight change.  HENT:  Positive for congestion, rhinorrhea and sinus pressure. Negative for sinus pain, sore throat and trouble swallowing.   Eyes: Negative.   Respiratory:  Negative for cough, shortness of breath and wheezing.   Cardiovascular:  Negative for chest pain, palpitations and leg swelling.  Gastrointestinal:  Negative for abdominal pain, blood in stool, constipation, diarrhea, nausea and vomiting.  Endocrine: Negative.   Genitourinary: Negative.  Negative for difficulty urinating.  Musculoskeletal: Negative.  Negative for arthralgias and myalgias.  Skin: Negative.   Neurological: Negative.  Negative for dizziness, syncope, weakness and light-headedness.  Hematological:  Negative for adenopathy. Does not bruise/bleed easily.  Psychiatric/Behavioral: Negative.     Objective:  BP 126/82 (BP Location: Left Arm, Patient Position: Sitting, Cuff Size: Large)    Pulse (!) 50    Temp 98.5 F (36.9 C) (Oral)    Resp 16    Ht 5\' 10"  (1.778 m)    Wt 268 lb (121.6 kg)    SpO2 94%    BMI 38.45 kg/m   BP Readings from Last 3 Encounters:  02/09/21 126/82  10/27/20 121/70  07/06/20 129/70    Wt Readings from Last 3 Encounters:  02/09/21 268 lb (121.6 kg)  10/27/20 277 lb (125.6 kg)  07/06/20 274 lb (124.3 kg)  Physical Exam Vitals reviewed.  Constitutional:      Appearance: Normal appearance.  HENT:     Nose: Rhinorrhea present. Rhinorrhea is purulent.     Mouth/Throat:     Pharynx: Oropharynx is clear.  Eyes:     General: No scleral icterus.    Conjunctiva/sclera: Conjunctivae normal.  Cardiovascular:     Rate and Rhythm: Regular rhythm. Bradycardia present.     Pulses: Normal pulses.     Heart sounds: No murmur heard.    Comments: EKG- Sinus bradycardia, 48 bpm Otherwise normal EKG Pulmonary:     Effort: Pulmonary  effort is normal.     Breath sounds: No stridor. No wheezing, rhonchi or rales.  Abdominal:     General: Abdomen is flat.     Palpations: There is no mass.     Tenderness: There is no abdominal tenderness. There is no guarding.     Hernia: No hernia is present.  Musculoskeletal:     Cervical back: Neck supple.     Right lower leg: No edema.     Left lower leg: No edema.  Lymphadenopathy:     Cervical: No cervical adenopathy.  Skin:    General: Skin is warm and dry.  Neurological:     General: No focal deficit present.     Mental Status: He is alert.  Psychiatric:        Mood and Affect: Mood normal.        Behavior: Behavior normal.    Lab Results  Component Value Date   WBC 6.1 02/09/2021   HGB 15.3 02/09/2021   HCT 43.8 02/09/2021   PLT 305.0 02/09/2021   GLUCOSE 101 (H) 02/09/2021   CHOL 133 02/09/2021   TRIG 145.0 02/09/2021   HDL 42.50 02/09/2021   LDLCALC 62 02/09/2021   ALT 20 06/29/2020   AST 17 06/29/2020   NA 138 02/09/2021   K 3.8 02/09/2021   CL 100 02/09/2021   CREATININE 1.03 02/09/2021   BUN 11 02/09/2021   CO2 32 02/09/2021   TSH 1.24 02/09/2021   PSA 0.880 08/30/2020   INR 1.0 03/05/2019   HGBA1C 5.8 02/09/2021    MR Lumbar Spine w/o contrast  Result Date: 01/10/2021 CLINICAL DATA:  Low back pain, > 6 wks EXAM: MRI LUMBAR SPINE WITHOUT CONTRAST TECHNIQUE: Multiplanar, multisequence MR imaging of the lumbar spine was performed. No intravenous contrast was administered. COMPARISON:  September 02, 2018. FINDINGS: Segmentation:  Standard. Alignment: Similar trace retrolisthesis of T12 on L1, L1 on L2, L2-L2 on L3. Vertebrae: Degenerative/discogenic endplate signal changes at multiple levels, greatest at L4-L5 and L2-L3. No specific evidence of acute fracture or discitis/osteomyelitis. Mildly heterogeneous bone marrow without suspicious bone lesion. Conus medullaris and cauda equina: Conus extends to the L1-L2 level. Conus appears normal. Paraspinal and other  soft tissues: A T2 hyperintense left renal lesion is again visualized, likely a cyst. Disc levels: T12-L1: Similar disc bulge, ligamentum flavum thickening facet hypertrophy with mild canal stenosis. Patent foramina. L1-L2: Mild disc bulging.  Patent canal and foramina.  Similar. L2-L3: Mild disc bulging, facet arthropathy and ligamentum flavum thickening with similar borderline mild canal and bilateral subarticular recess stenosis. Patent foramina. L3-L4: Mild disc bulging and facet hypertrophy. Similar versus mildly progressed mild to moderate left foraminal stenosis. Similar mild bilateral subarticular recess narrowing. Patent right foramen. L4-L5: Progressive disc height loss. Posterior disc bulge and endplate spurring. Ligamentum flavum thickening. Similar mild canal stenosis. Similar versus slightly progressed moderate bilateral foraminal stenosis. L5-S1:  Asymmetric right facet arthropathy with facet joint effusion. Previously seen right facet joint synovial cyst is not confidently visualized and probably is decreased or resolved. Right foraminal stenosis is improved, now mild to moderate. No significant canal stenosis. Similar mild left foraminal stenosis. LS catheter allergy a CT IMPRESSION: 1. At L4-L5, progressive degenerative disc disease with similar versus mildly progressed moderate bilateral foraminal stenosis. Similar mild canal stenosis. 2. At L5-S1, previously seen right facet joint synovial cyst is not confidently visualized and probably is decreased or resolved. Right foraminal stenosis is improved, now mild to moderate. 3. At L3-L4, similar versus slightly progressed mild to moderate left foraminal stenosis with mild bilateral subarticular recess stenosis. Electronically Signed   By: Margaretha Sheffield M.D.   On: 01/10/2021 12:42    Assessment & Plan:   Zafir was seen today for hypertension and hypothyroidism.  Diagnoses and all orders for this visit:  Essential hypertension- His blood  pressure is overcontrolled and he is bradycardic.  He is asymptomatic.  No intervention is needed for the bradycardia.  I have asked him to stop taking his antihypertensive. -     EKG 12-Lead -     CBC with Differential/Platelet; Future -     Basic metabolic panel; Future -     TSH; Future -     TSH -     Basic metabolic panel -     CBC with Differential/Platelet  Hypothyroidism, unspecified type- His TSH is in the normal range.  He will stay on the current dose of levothyroxine. -     TSH; Future -     TSH  Chronic hyperglycemia- His A1c is normal. -     Basic metabolic panel; Future -     Hemoglobin A1c; Future -     Hemoglobin A1c -     Basic metabolic panel  Dyslipidemia, goal LDL below 130- LDL goal achieved. Doing well on the statin  -     Lipid panel; Future -     TSH; Future -     TSH -     Lipid panel  Flu vaccine need -     Flu Vaccine QUAD High Dose(Fluad)  Acute non-recurrent maxillary sinusitis -     azithromycin (ZITHROMAX) 500 MG tablet; Take 1 tablet (500 mg total) by mouth daily for 3 days.   I have discontinued Verdie Barrows. Bucaro's phentermine and olmesartan-hydrochlorothiazide. I am also having him start on azithromycin. Additionally, I am having him maintain his cetirizine, acetaminophen, tadalafil, Testosterone, valACYclovir, valACYclovir, traZODone, levothyroxine, and rosuvastatin.  Meds ordered this encounter  Medications   azithromycin (ZITHROMAX) 500 MG tablet    Sig: Take 1 tablet (500 mg total) by mouth daily for 3 days.    Dispense:  3 tablet    Refill:  0     Follow-up: Return in about 3 months (around 05/10/2021).  Scarlette Calico, MD

## 2021-02-09 NOTE — Telephone Encounter (Signed)
Pt requesting medication for sinuses. Was in this morning and forgot to ask. Requesting a zpack   [id number] - [place], [place] - [address] Phone:  [phone]  Fax:  [phone]

## 2021-02-09 NOTE — Patient Instructions (Signed)

## 2021-02-10 ENCOUNTER — Encounter: Payer: Self-pay | Admitting: Internal Medicine

## 2021-02-11 DIAGNOSIS — Z23 Encounter for immunization: Secondary | ICD-10-CM | POA: Insufficient documentation

## 2021-02-11 DIAGNOSIS — J0100 Acute maxillary sinusitis, unspecified: Secondary | ICD-10-CM | POA: Insufficient documentation

## 2021-02-11 MED ORDER — AZITHROMYCIN 500 MG PO TABS: 500.0000 mg | ORAL_TABLET | Freq: Every day | ORAL | 0 refills | Status: AC

## 2021-02-17 DIAGNOSIS — J0141 Acute recurrent pansinusitis: Secondary | ICD-10-CM

## 2021-02-17 DIAGNOSIS — J342 Deviated nasal septum: Secondary | ICD-10-CM

## 2021-02-17 DIAGNOSIS — J343 Hypertrophy of nasal turbinates: Secondary | ICD-10-CM

## 2021-02-17 DIAGNOSIS — J310 Chronic rhinitis: Secondary | ICD-10-CM | POA: Diagnosis not present

## 2021-02-17 HISTORY — DX: Hypertrophy of nasal turbinates: J34.3

## 2021-02-17 HISTORY — DX: Acute recurrent pansinusitis: J01.41

## 2021-02-17 HISTORY — DX: Deviated nasal septum: J34.2

## 2021-02-24 ENCOUNTER — Other Ambulatory Visit (HOSPITAL_COMMUNITY): Payer: Self-pay

## 2021-02-24 ENCOUNTER — Encounter: Payer: Self-pay | Admitting: Orthopedic Surgery

## 2021-02-24 ENCOUNTER — Ambulatory Visit (INDEPENDENT_AMBULATORY_CARE_PROVIDER_SITE_OTHER): Payer: Medicare Other | Admitting: Orthopedic Surgery

## 2021-02-24 ENCOUNTER — Other Ambulatory Visit: Payer: Self-pay

## 2021-02-24 DIAGNOSIS — L309 Dermatitis, unspecified: Secondary | ICD-10-CM | POA: Diagnosis not present

## 2021-02-24 MED ORDER — CEFDINIR 300 MG PO CAPS
300.0000 mg | ORAL_CAPSULE | Freq: Two times a day (BID) | ORAL | 0 refills | Status: DC
  Filled 2021-02-24: qty 14, 7d supply, fill #0

## 2021-02-24 NOTE — Progress Notes (Addendum)
Office Visit Note   Patient: Jonathan Powers           Date of Birth: 1950/09/08           MRN: [id number] Visit Date: 02/24/2021              Requested by: [name], MD [id number] [place],  [place] [address] PCP: [name], MD  Chief Complaint  Patient presents with   Left Foot - Wound Check      HPI: Patient is a 71 year old gentleman who presents complaining of blistering and dermatitis of both feet worse in the webspaces of the left foot laterally.  Patient states he recently was on antibiotic for sinus infection and does not know if this contributed to the problem.  Assessment & Plan: Visit Diagnoses:  1. Dermatitis of left foot     Plan: Patient was given 5 sleeve to cut into strips to fanfold between the toes.  He was also given a pair of Vive crew socks to be worn daily.  Recommend patient at his next cardiology appointment obtain a baseline ABI.  Follow-Up Instructions: Return if symptoms worsen or fail to improve.   Ortho Exam  Patient is alert, oriented, no adenopathy, well-dressed, normal affect, normal respiratory effort. Examination patient has a strong posterior tibial pulse bilaterally in the dorsalis pedis pulse on the right.  The dorsalis pedis pulse on the left is not as strong.  A Doppler was used and patient has a strong biphasic posterior tibial pulse bilaterally and on the right.  A good biphasic dorsalis pedis pulse.  The left foot has a weaker dorsalis pedis pulse by Doppler.  Patient has dermatitis with partial thickness skin blistering from a fungal rash within the webspaces.  There is no ascending cellulitis no exposed bone or tendon.  Imaging: No results found. No images are attached to the encounter.  Labs: Lab Results  Component Value Date   HGBA1C 5.8 02/09/2021   HGBA1C 5.8 06/29/2020   HGBA1C 5.1 11/09/2018   ESRSEDRATE 9 07/09/2018   ESRSEDRATE 9 01/03/2017   ESRSEDRATE 10 04/13/2016   CRP 2.2 07/09/2018    LABURIC 9.0 (H) 11/28/2019   LABURIC 8.1 (H) 07/09/2018   LABURIC 6.7 02/05/2018   REPTSTATUS 08/06/2019 FINAL 08/05/2019   CULT  08/05/2019    NO GROWTH Performed at [facility], [place] [address], [place], [place] [address]      Lab Results  Component Value Date   ALBUMIN 4.2 06/29/2020   ALBUMIN 4.4 07/16/2019   ALBUMIN 4.0 11/09/2018    No results found for: MG Lab Results  Component Value Date   VD25OH 55 07/09/2018   VD25OH 54 04/04/2018   VD25OH 46 03/01/2016    No results found for: PREALBUMIN CBC EXTENDED Latest Ref Rng & Units 02/09/2021 06/29/2020 11/28/2019  WBC 4.0 - 10.5 K/uL 6.1 7.6 7.8  RBC 4.22 - 5.81 Mil/uL 5.02 4.89 4.83  HGB 13.0 - 17.0 g/dL 15.3 15.0 14.8  HCT 39.0 - 52.0 % 43.8 43.6 43.0  PLT 150.0 - 400.0 K/uL 305.0 309.0 328  NEUTROABS 1.4 - 7.7 K/uL 3.8 4.9 4,649  LYMPHSABS 0.7 - 4.0 K/uL 1.5 1.7 2,098     There is no height or weight on file to calculate BMI.  Orders:  No orders of the defined types were placed in this encounter.  No orders of the defined types were placed in this encounter.    Procedures: No  procedures performed  Clinical Data: No additional findings.  ROS:  All other systems negative, except as noted in the HPI. Review of Systems  Objective: Vital Signs: There were no vitals taken for this visit.  Specialty Comments:  No specialty comments available.  PMFS History: Patient Active Problem List   Diagnosis Date Noted   Acute non-recurrent maxillary sinusitis 02/11/2021   Flu vaccine need 02/11/2021   Amnestic MCI (mild cognitive impairment with memory loss) 10/08/2020   Bronchitis due to COVID-19 virus 08/13/2020   Sinusitis 07/24/2020   Memory deficit 06/29/2020   Chronic hyperglycemia 06/29/2020   Failed total hip arthroplasty ([place]) 03/06/2019   Recurrent genital HSV (herpes simplex virus) infection 11/12/2018   Other specified hypothyroidism 11/11/2018   Insomnia 11/10/2018   GERD  (gastroesophageal reflux disease) 11/10/2018   Seasonal allergies 11/10/2018   Recurrent oral herpes simplex 09/23/2018   Age-related osteoporosis with current pathological fracture 03/11/2018   Flexural eczema 07/24/2017   Chronic right-sided low back pain without sciatica 07/21/2016   Low testosterone 07/21/2016   Superior mesenteric artery aneurysm ([place]) 07/12/2015   Left thyroid nodule    OSA (obstructive sleep apnea) 01/27/2013   Hypothyroid    Symptomatic PVCs 09/06/2012   Dyslipidemia, goal LDL below 130 09/06/2012   BPH (benign prostatic hyperplasia) 09/06/2012   Essential hypertension 05/17/2006   ALLERGIC RHINITIS 05/17/2006   OSTEOARTHRITIS 05/17/2006   Past Medical History:  Diagnosis Date   ALLERGIC RHINITIS    Cancer ([place])    Melonoma back   CELLULITIS, LEG, RIGHT    Recurrent R hip cellulitis 1/08,3/08    Diverticulitis    GERD (gastroesophageal reflux disease)    Gout    Hashimoto's thyroiditis    Hypertension    Hypogonadism male    low T, a/w ED   Hypothyroid    goiters    Left thyroid nodule 2008   on [place], decrease size in 2011 [place]   Migraines    Atypical and ocular   OSTEOARTHRITIS    Seasonal allergies    Sleep apnea    cpap    Family History  Problem Relation Age of Onset   Arthritis Father    Heart disease Father    Diabetes Father    Vascular Disease Father        AoBifem   Arthritis Mother    Diabetes Mother    Multiple sclerosis Mother    Cancer Paternal Grandmother        "GI"   Cancer Paternal Grandfather        stomach cancer   Thyroid cancer Sister    Uterine cancer Sister     Past Surgical History:  Procedure Laterality Date   CARPAL TUNNEL RELEASE Left 03/30/2017   Procedure: CARPAL TUNNEL RELEASE;  Surgeon: Garald Balding, MD;  Location: [facility];  Service: Orthopedics;  Laterality: Left;   CHOLECYSTECTOMY  2004   CTR Bilateral [place]   FOOT FUSION Left 06/22/2017   HEMORRHOID  SURGERY N/A 03/30/2017   Procedure: HEMORRHOIDECTOMY;  Surgeon: Coralie Keens, MD;  Location: [facility];  Service: General;  Laterality: N/A;   open reduction L little finger Left 1970   Repair digital thumb, left Left 1990   Right shoulder cuff repair Right 09/15/11   Right shoulder SAD, DCR Right 02/05/11   TOTAL HIP ARTHROPLASTY Left 07/26/06   TOTAL HIP ARTHROPLASTY Right 1999   TOTAL HIP REVISION Left 03/07/2019   Procedure:  LEFT TOTAL HIP REVISION POSTERIOR APPROACH;  Surgeon: Frederik Pear, MD;  Location: [facility];  Service: Orthopedics;  Laterality: Left;   Social History   Occupational History   Occupation: Orthopedic PA    Employer: [employer]  Tobacco Use   Smoking status: Never   Smokeless tobacco: Never  Vaping Use   Vaping Use: Never used  Substance and Sexual Activity   Alcohol use: Yes    Alcohol/week: 0.0 standard drinks    Comment: rare   Drug use: No   Sexual activity: Yes

## 2021-03-04 ENCOUNTER — Ambulatory Visit: Payer: Medicare Other | Admitting: Cardiology

## 2021-03-04 ENCOUNTER — Encounter: Payer: Self-pay | Admitting: Cardiology

## 2021-03-04 ENCOUNTER — Other Ambulatory Visit: Payer: Self-pay

## 2021-03-04 VITALS — BP 144/64 | HR 68 | Temp 98.4°F | Resp 16 | Ht 70.0 in | Wt 276.4 lb

## 2021-03-04 DIAGNOSIS — E6609 Other obesity due to excess calories: Secondary | ICD-10-CM

## 2021-03-04 DIAGNOSIS — R931 Abnormal findings on diagnostic imaging of heart and coronary circulation: Secondary | ICD-10-CM

## 2021-03-04 DIAGNOSIS — Z6839 Body mass index (BMI) 39.0-39.9, adult: Secondary | ICD-10-CM

## 2021-03-04 DIAGNOSIS — I10 Essential (primary) hypertension: Secondary | ICD-10-CM

## 2021-03-04 MED ORDER — BUPROPION HCL ER (SR) 150 MG PO TB12: 150.0000 mg | ORAL_TABLET | Freq: Two times a day (BID) | ORAL | 3 refills | Status: DC

## 2021-03-04 NOTE — Progress Notes (Signed)
Primary Physician/Referring:  [name], MD  Referring Bo Merino, MD Patient ID: Jonathan Powers, male    DOB: [birth year], 71 y.o.   MRN: [id number]  Chief Complaint  Patient presents with   Hypertension   Obesity   Follow-up    1 year   HPI:    Jonathan Powers  is a 71 y.o. Caucasian male with history of idiopathic gout, hypertension, mild hypertriglyceridemia and metabolic syndrome with elevated blood sugar without diabetes, moderate obesity, obstructive sleep apnea on CPAP evaluted by me for hypertension and chest pain and has had a Coronary CTA on 11/19/2018 which revealed mild luminal irregularity and a calcium score of 250 (66th MESA database percentile).  Presents for 6 month follow up of obesity and hypertension.  He is tolerating all his medications for hypertension and hyperlipidemia.  He had tried phentermine with about a 16 to 18 pound weight loss but could not tolerate Wellbutrin due to side effects.  Since discontinuing phentermine, she has regained his weight back.  States that he is continuing to be active.  He thinks he may be developing early cognitive disorder and is being evaluated by neurology.     Past Medical History:  Diagnosis Date   ALLERGIC RHINITIS    Cancer ([place])    Melonoma back   CELLULITIS, LEG, RIGHT    Recurrent R hip cellulitis 1/08,3/08    Diverticulitis    GERD (gastroesophageal reflux disease)    Gout    Hashimoto's thyroiditis    Hypertension    Hypogonadism male    low T, a/w ED   Hypothyroid    goiters    Left thyroid nodule 2008   on [place], decrease size in 2011 [place]   Migraines    Atypical and ocular   OSTEOARTHRITIS    Seasonal allergies    Sleep apnea    cpap   Past Surgical History:  Procedure Laterality Date   CARPAL TUNNEL RELEASE Left 03/30/2017   Procedure: CARPAL TUNNEL RELEASE;  Surgeon: Garald Balding, MD;  Location: [place];  Service: Orthopedics;  Laterality: Left;   CHOLECYSTECTOMY   2004   CTR Bilateral [place]   FOOT FUSION Left 06/22/2017   HEMORRHOID SURGERY N/A 03/30/2017   Procedure: HEMORRHOIDECTOMY;  Surgeon: Coralie Keens, MD;  Location: [place];  Service: General;  Laterality: N/A;   open reduction L little finger Left 1970   Repair digital thumb, left Left 1990   Right shoulder cuff repair Right 09/15/11   Right shoulder SAD, DCR Right 02/05/11   TOTAL HIP ARTHROPLASTY Left 07/26/06   TOTAL HIP ARTHROPLASTY Right 1999   TOTAL HIP REVISION Left 03/07/2019   Procedure: LEFT TOTAL HIP REVISION POSTERIOR APPROACH;  Surgeon: Frederik Pear, MD;  Location: [facility];  Service: Orthopedics;  Laterality: Left;   Social History   Tobacco Use   Smoking status: Never   Smokeless tobacco: Never  Substance Use Topics   Alcohol use: Yes    Alcohol/week: 0.0 standard drinks    Comment: rare   Marital Status: Married   ROS  Review of Systems  Constitutional: Negative for weight gain.  Cardiovascular:  Positive for dyspnea on exertion (stable). Negative for chest pain and leg swelling.  Musculoskeletal:  Positive for arthritis, back pain, joint pain (neck and left shoulder) and neck pain.  Gastrointestinal:  Negative for melena.  Objective  Blood pressure (!) 144/64, pulse 68, temperature 98.4 F (36.9  C), temperature source Temporal, resp. rate 16, height 5\' 10"  (1.778 m), weight 276 lb 6.4 oz (125.4 kg), SpO2 98 %. Body mass index is 39.66 kg/m.  Vitals with BMI 03/04/2021 02/09/2021 10/27/2020  Height 5\' 10"  5\' 10"  5\' 10"   Weight 276 lbs 6 oz 268 lbs 277 lbs  BMI 39.66 84.69 62.95  Systolic 284 132 440  Diastolic 64 82 70  Pulse 68 50 68  Some encounter information is confidential and restricted. Go to Review Flowsheets activity to see all data.     Physical Exam Constitutional:      Appearance: He is obese.  Neck:     Vascular: No carotid bruit or JVD.  Cardiovascular:     Rate and Rhythm: Normal rate and regular rhythm.      Pulses: Intact distal pulses.     Heart sounds: Normal heart sounds. No murmur heard.   No gallop.  Pulmonary:     Effort: Pulmonary effort is normal.     Breath sounds: Normal breath sounds.  Abdominal:     General: Bowel sounds are normal.     Palpations: Abdomen is soft.  Musculoskeletal:        General: No swelling.   Laboratory examination:   Recent Labs    02/09/21 1021  NA 138  K 3.8  CL 100  CO2 32  GLUCOSE 101*  BUN 11  CREATININE 1.03  CALCIUM 9.8   CMP Latest Ref Rng & Units 02/09/2021 06/29/2020 11/28/2019  Glucose 70 - 99 mg/dL 101(H) - 122(H)  BUN 6 - 23 mg/dL 11 - 14  Creatinine 0.40 - 1.50 mg/dL 1.03 - 0.86  Sodium 135 - 145 mEq/L 138 - 139  Potassium 3.5 - 5.1 mEq/L 3.8 - 3.9  Chloride 96 - 112 mEq/L 100 - 103  CO2 19 - 32 mEq/L 32 - 28  Calcium 8.4 - 10.5 mg/dL 9.8 - 9.5  Total Protein 6.0 - 8.3 g/dL - 6.9 6.7  Total Bilirubin 0.2 - 1.2 mg/dL - 0.6 0.6  Alkaline Phos 39 - 117 U/L - 47 -  AST 0 - 37 U/L - 17 21  ALT 0 - 53 U/L - 20 17   CBC Latest Ref Rng & Units 02/09/2021 06/29/2020 11/28/2019  WBC 4.0 - 10.5 K/uL 6.1 7.6 7.8  Hemoglobin 13.0 - 17.0 g/dL 15.3 15.0 14.8  Hematocrit 39.0 - 52.0 % 43.8 43.6 43.0  Platelets 150.0 - 400.0 K/uL 305.0 309.0 328    Lipid Panel     Component Value Date/Time   CHOL 133 02/09/2021 1021   TRIG 145.0 02/09/2021 1021   HDL 42.50 02/09/2021 1021   CHOLHDL 3 02/09/2021 1021   VLDL 29.0 02/09/2021 1021   LDLCALC 62 02/09/2021 1021   LDLCALC 40 11/19/2018 0931   HEMOGLOBIN A1C Lab Results  Component Value Date   HGBA1C 5.8 02/09/2021   MPG 99.67 11/09/2018   TSH Recent Labs    06/29/20 1159 02/09/21 1021  TSH 1.47 1.24   Medications   Current Outpatient Medications  Medication Instructions   acetaminophen (TYLENOL) 1,000 mg, Oral, Every 6 hours PRN   buPROPion (WELLBUTRIN SR) 150 mg, Oral, 2 times daily, 1 tablet in the morning, 1 tablet in the late afternoon   cetirizine (ZYRTEC) 10 mg,  Daily   levothyroxine (SYNTHROID) 175 MCG tablet TAKE 1 TABLET BY MOUTH EVERY DAY BEFORE BREAKFAST   olmesartan-hydrochlorothiazide (BENICAR HCT) 40-25 MG tablet 1 tablet, Oral, Daily, As directed   rosuvastatin (CRESTOR) 5  MG tablet TAKE ONE TABLET BY MOUTH DAILY AT BEDTIME   tadalafil (CIALIS) 5 mg, Oral, Daily at bedtime   Testosterone 1.62 % GEL 3 Pump, Topical, Daily   traZODone (DESYREL) 100 mg, Oral, At bedtime PRN   valACYclovir (VALTREX) 1,000 mg, Oral, 2 times daily   valACYclovir (VALTREX) 1,000 mg, Oral, 2 times daily   Radiology:   Coronary CTA 11/11/2018:  1. Minimal non-obstructive CAD, CADRADS = 0. 2. Coronary calcium score of 250.78. This was 66th percentile for age and sex matched control. 3. Normal coronary origin with right dominance. 4. Atherosclerotic calcification of the aorta.  Chest x-ray 05/15/2019: Mild right basilar linear atelectasis without active cardiopulmonary disease.  Chest x-ray 08/05/2019:  Poor inspiration. Possible mild right base atelectasis. No active disease suspected otherwise.  Cardiac Studies:   Lexiscan Myoview stress test 09/30/2012: No evidence of ischemia.  Normal   LVEF.  Echocardiogram 12/28/2018:  Left ventricle cavity is normal in size. Mild concentric hypertrophy of the left ventricle. Normal LV systolic function with EF 59%. Normal global wall motion. Doppler evidence of grade I (impaired) diastolic dysfunction, normal LAP.  Left atrial cavity is mildly dilated.  Mild tricuspid regurgitation. Estimated pulmonary artery systolic pressure is 26 mmHg.  IVC is dilated with respiratory variation. Estimated RA pressure 8 mmHg.  Carotid artery duplex 03/18/2020: Doppler velocity suggests stenosis in the right external carotid artery (<50%).  Peak systolic velocities in the left bifurcation, internal, external and common carotid arteries are within normal limits. Antegrade right vertebral artery flow. Antegrade left vertebral artery  flow. Follow up appropriate if clinically indicated. Right external carotid stenosis is the source of bruit.   EKG:  EKG 03/04/2021: Normal sinus rhythm at rate of 63 bpm, no evidence of ischemia, normal EKG. No significant change from EKG 12/03/2019    Assessment     ICD-10-CM   1. Essential hypertension  I10 EKG 12-Lead    olmesartan-hydrochlorothiazide (BENICAR HCT) 40-25 MG tablet    2. Class 2 obesity due to excess calories without serious comorbidity with body mass index (BMI) of 39.0 to 39.9 in adult  E66.09 buPROPion (WELLBUTRIN SR) 150 MG 12 hr tablet   Z68.39     3. Agatston coronary artery calcium score between 200 and 399, MESA Percentile: 67  R93.1       Meds ordered this encounter  Medications   buPROPion (WELLBUTRIN SR) 150 MG 12 hr tablet    Sig: Take 1 tablet (150 mg total) by mouth 2 (two) times daily. 1 tablet in the morning, 1 tablet in the late afternoon    Dispense:  60 tablet    Refill:  3   olmesartan-hydrochlorothiazide (BENICAR HCT) 40-25 MG tablet    Sig: Take 1 tablet by mouth daily. As directed    Dispense:  90 tablet    Refill:  3    DX Code Needed  .   Medications Discontinued During This Encounter  Medication Reason   cefdinir (OMNICEF) 300 MG capsule    Orders Placed This Encounter  Procedures   EKG 12-Lead   Recommendations:   AYDYN TESTERMAN  is a 71 y.o. Caucasian male with history of idiopathic gout, hypertension, mild hypertriglyceridemia and metabolic syndrome with elevated blood sugar without diabetes, moderate obesity, obstructive sleep apnea on CPAP evaluted by me for hypertension and chest pain and has had a Coronary CTA on 11/19/2018 which revealed mild luminal irregularity and a calcium score of 250 (66th MESA database percentile).  His blood  pressure has been well controlled on Benicar HCT which he takes 1 tablet daily alternating with 1/2 tablet.  Today it was slightly elevated but in general states that it has been less than  751-700 range systolic.  Lipids are well controlled.  He has not had any chest pain.  Since discontinuing phentermine he has regained his 20 pound weight loss.  He now suspects may be developing cognitive disorder, I am beginning to wonder whether he has exogenous depression as well in the office social issues.  Both with weight loss and depression in mind I would like to try Wellbutrin again.  Patient is willing to start this.  Otherwise stable from cardiac standpoint, if he tolerates we will send for 90-day Rx.  I will see him back in a year.    Adrian Prows, MD, [facility] 03/05/2021, 8:29 AM Office: [phone] Pager: [phone]

## 2021-03-05 MED ORDER — OLMESARTAN MEDOXOMIL-HCTZ 40-25 MG PO TABS: 1.0000 | ORAL_TABLET | Freq: Every day | ORAL | 3 refills | Status: DC

## 2021-03-09 ENCOUNTER — Encounter: Payer: Self-pay | Admitting: Internal Medicine

## 2021-03-11 ENCOUNTER — Ambulatory Visit (INDEPENDENT_AMBULATORY_CARE_PROVIDER_SITE_OTHER): Payer: Medicare Other | Admitting: Internal Medicine

## 2021-03-11 ENCOUNTER — Encounter: Payer: Self-pay | Admitting: Internal Medicine

## 2021-03-11 ENCOUNTER — Other Ambulatory Visit: Payer: Self-pay

## 2021-03-11 ENCOUNTER — Telehealth: Payer: Self-pay

## 2021-03-11 ENCOUNTER — Encounter: Payer: Self-pay | Admitting: Cardiology

## 2021-03-11 ENCOUNTER — Ambulatory Visit (INDEPENDENT_AMBULATORY_CARE_PROVIDER_SITE_OTHER): Payer: Medicare Other

## 2021-03-11 ENCOUNTER — Ambulatory Visit: Payer: Medicare Other | Admitting: Cardiology

## 2021-03-11 VITALS — BP 146/84 | HR 63 | Temp 97.8°F | Resp 16 | Ht 70.0 in

## 2021-03-11 VITALS — BP 118/68 | HR 63 | Ht 70.0 in | Wt 271.0 lb

## 2021-03-11 DIAGNOSIS — M546 Pain in thoracic spine: Secondary | ICD-10-CM

## 2021-03-11 DIAGNOSIS — R748 Abnormal levels of other serum enzymes: Secondary | ICD-10-CM

## 2021-03-11 DIAGNOSIS — I10 Essential (primary) hypertension: Secondary | ICD-10-CM | POA: Diagnosis not present

## 2021-03-11 DIAGNOSIS — R058 Other specified cough: Secondary | ICD-10-CM

## 2021-03-11 DIAGNOSIS — R0609 Other forms of dyspnea: Secondary | ICD-10-CM | POA: Diagnosis not present

## 2021-03-11 DIAGNOSIS — M47814 Spondylosis without myelopathy or radiculopathy, thoracic region: Secondary | ICD-10-CM | POA: Diagnosis not present

## 2021-03-11 DIAGNOSIS — R059 Cough, unspecified: Secondary | ICD-10-CM | POA: Diagnosis not present

## 2021-03-11 LAB — TROPONIN I (HIGH SENSITIVITY): High Sens Troponin I: 24 ng/L (ref 2–17)

## 2021-03-11 LAB — D-DIMER, QUANTITATIVE: D-Dimer, Quant: 0.43 mcg/mL FEU (ref <0.50)

## 2021-03-11 LAB — PRO B NATRIURETIC PEPTIDE: NT-Pro BNP: 54 pg/mL (ref 0–376)

## 2021-03-11 LAB — POC COVID19 BINAXNOW: SARS Coronavirus 2 Ag: NEGATIVE

## 2021-03-11 LAB — SARS-COV-2 IGG: SARS-COV-2 IgG: 5.51

## 2021-03-11 NOTE — Telephone Encounter (Signed)
Hope called from the lab, pt has a critical Troponin level of 24.  Sars-COV-2IgG result came back reactive 5.51

## 2021-03-11 NOTE — Patient Instructions (Signed)

## 2021-03-11 NOTE — Progress Notes (Signed)
Subjective:  Patient ID: Jonathan Powers, male    DOB: 1950/03/24  Age: 71 y.o. MRN: [id number]  CC: Cough and Back Pain  This visit occurred during the SARS-CoV-2 public health emergency.  Safety protocols were in place, including screening questions prior to the visit, additional usage of staff PPE, and extensive cleaning of exam room while observing appropriate contact time as indicated for disinfecting solutions.    HPI Jonathan Powers presents for f/up -  He complains of a 1 week history of pain in his thoracic spine and cough productive of white phlegm with DOE.  He denies hemoptysis, pleuritic pain, fever, chills, or wheezing.  Outpatient Medications Prior to Visit  Medication Sig Dispense Refill   acetaminophen (TYLENOL) 500 MG tablet Take 1,000 mg by mouth every 6 (six) hours as needed for moderate pain.      buPROPion (WELLBUTRIN SR) 150 MG 12 hr tablet Take 1 tablet (150 mg total) by mouth 2 (two) times daily. 1 tablet in the morning, 1 tablet in the late afternoon 60 tablet 3   cetirizine (ZYRTEC) 10 MG tablet Take 10 mg by mouth daily.     levothyroxine (SYNTHROID) 175 MCG tablet TAKE 1 TABLET BY MOUTH EVERY DAY BEFORE BREAKFAST 90 tablet 0   olmesartan-hydrochlorothiazide (BENICAR HCT) 40-25 MG tablet Take 1 tablet by mouth daily. As directed 90 tablet 3   rosuvastatin (CRESTOR) 5 MG tablet TAKE ONE TABLET BY MOUTH DAILY AT BEDTIME 90 tablet 0   tadalafil (CIALIS) 5 MG tablet Take 5 mg by mouth at bedtime.     Testosterone 1.62 % GEL Apply 3 Pump topically daily.     traZODone (DESYREL) 100 MG tablet Take 1 tablet (100 mg total) by mouth at bedtime as needed for sleep. 90 tablet 1   valACYclovir (VALTREX) 1000 MG tablet Take 1 tablet (1,000 mg total) by mouth 2 (two) times daily. (Patient taking differently: Take 1,000 mg by mouth as needed.) 14 tablet 5   valACYclovir (VALTREX) 1000 MG tablet Take 1 tablet (1,000 mg total) by mouth 2 (two) times daily. 30 tablet 0   No  facility-administered medications prior to visit.    ROS Review of Systems  Constitutional:  Negative for appetite change, diaphoresis, fatigue and fever.  HENT: Negative.  Negative for sore throat and trouble swallowing.   Respiratory:  Positive for cough and shortness of breath. Negative for chest tightness and wheezing.   Cardiovascular:  Negative for chest pain, palpitations and leg swelling.  Gastrointestinal:  Negative for abdominal pain, diarrhea and nausea.  Genitourinary: Negative.  Negative for difficulty urinating.  Musculoskeletal:  Positive for back pain.  Skin: Negative.  Negative for color change and rash.  Neurological:  Negative for dizziness, weakness and light-headedness.  Hematological:  Negative for adenopathy. Does not bruise/bleed easily.  Psychiatric/Behavioral: Negative.     Objective:  BP 118/68    Pulse 63    Ht 5\' 10"  (1.778 m)    Wt 271 lb (122.9 kg)    SpO2 95%    BMI 38.88 kg/m   BP Readings from Last 3 Encounters:  03/11/21 (!) 146/84  03/11/21 118/68  03/04/21 (!) 144/64    Wt Readings from Last 3 Encounters:  03/11/21 271 lb (122.9 kg)  03/04/21 276 lb 6.4 oz (125.4 kg)  02/09/21 268 lb (121.6 kg)    Physical Exam Vitals reviewed.  HENT:     Mouth/Throat:     Mouth: Mucous membranes are moist.  Eyes:  General: No scleral icterus.    Conjunctiva/sclera: Conjunctivae normal.  Cardiovascular:     Rate and Rhythm: Normal rate and regular rhythm.     Heart sounds: No murmur heard.   No friction rub. No gallop.  Pulmonary:     Effort: Pulmonary effort is normal.     Breath sounds: No stridor. No wheezing, rhonchi or rales.  Abdominal:     General: Abdomen is protuberant. Bowel sounds are normal. There is no distension.     Palpations: Abdomen is soft. There is no hepatomegaly, splenomegaly or mass.  Musculoskeletal:        General: Normal range of motion.     Cervical back: Normal and neck supple.     Thoracic back: Normal. No signs  of trauma, tenderness or bony tenderness.     Lumbar back: Normal.     Right lower leg: No edema.     Left lower leg: No edema.  Lymphadenopathy:     Cervical: No cervical adenopathy.  Skin:    General: Skin is warm and dry.     Coloration: Skin is not pale.  Neurological:     General: No focal deficit present.     Mental Status: He is alert. Mental status is at baseline.  Psychiatric:        Mood and Affect: Mood normal.    Lab Results  Component Value Date   WBC 6.1 02/09/2021   HGB 15.3 02/09/2021   HCT 43.8 02/09/2021   PLT 305.0 02/09/2021   GLUCOSE 101 (H) 02/09/2021   CHOL 133 02/09/2021   TRIG 145.0 02/09/2021   HDL 42.50 02/09/2021   LDLCALC 62 02/09/2021   ALT 20 06/29/2020   AST 17 06/29/2020   NA 138 02/09/2021   K 3.8 02/09/2021   CL 100 02/09/2021   CREATININE 1.03 02/09/2021   BUN 11 02/09/2021   CO2 32 02/09/2021   TSH 1.24 02/09/2021   PSA 0.880 08/30/2020   INR 1.0 03/05/2019   HGBA1C 5.8 02/09/2021    MR Lumbar Spine w/o contrast  Result Date: 01/10/2021 CLINICAL DATA:  Low back pain, > 6 wks EXAM: MRI LUMBAR SPINE WITHOUT CONTRAST TECHNIQUE: Multiplanar, multisequence MR imaging of the lumbar spine was performed. No intravenous contrast was administered. COMPARISON:  September 02, 2018. FINDINGS: Segmentation:  Standard. Alignment: Similar trace retrolisthesis of T12 on L1, L1 on L2, L2-L2 on L3. Vertebrae: Degenerative/discogenic endplate signal changes at multiple levels, greatest at L4-L5 and L2-L3. No specific evidence of acute fracture or discitis/osteomyelitis. Mildly heterogeneous bone marrow without suspicious bone lesion. Conus medullaris and cauda equina: Conus extends to the L1-L2 level. Conus appears normal. Paraspinal and other soft tissues: A T2 hyperintense left renal lesion is again visualized, likely a cyst. Disc levels: T12-L1: Similar disc bulge, ligamentum flavum thickening facet hypertrophy with mild canal stenosis. Patent foramina.  L1-L2: Mild disc bulging.  Patent canal and foramina.  Similar. L2-L3: Mild disc bulging, facet arthropathy and ligamentum flavum thickening with similar borderline mild canal and bilateral subarticular recess stenosis. Patent foramina. L3-L4: Mild disc bulging and facet hypertrophy. Similar versus mildly progressed mild to moderate left foraminal stenosis. Similar mild bilateral subarticular recess narrowing. Patent right foramen. L4-L5: Progressive disc height loss. Posterior disc bulge and endplate spurring. Ligamentum flavum thickening. Similar mild canal stenosis. Similar versus slightly progressed moderate bilateral foraminal stenosis. L5-S1: Asymmetric right facet arthropathy with facet joint effusion. Previously seen right facet joint synovial cyst is not confidently visualized and probably is decreased or  resolved. Right foraminal stenosis is improved, now mild to moderate. No significant canal stenosis. Similar mild left foraminal stenosis. LS catheter allergy a CT IMPRESSION: 1. At L4-L5, progressive degenerative disc disease with similar versus mildly progressed moderate bilateral foraminal stenosis. Similar mild canal stenosis. 2. At L5-S1, previously seen right facet joint synovial cyst is not confidently visualized and probably is decreased or resolved. Right foraminal stenosis is improved, now mild to moderate. 3. At L3-L4, similar versus slightly progressed mild to moderate left foraminal stenosis with mild bilateral subarticular recess stenosis. Electronically Signed   By: Margaretha Sheffield M.D.   On: 01/10/2021 12:42   DG Chest 2 View  Result Date: 03/11/2021 CLINICAL DATA:  Cough for the past week. EXAM: CHEST - 2 VIEW COMPARISON:  Chest x-ray dated December 24, 2019. FINDINGS: The heart size and mediastinal contours are within normal limits. Both lungs are clear. The visualized skeletal structures are unremarkable. IMPRESSION: No active cardiopulmonary disease. Electronically Signed   By:  Titus Dubin M.D.   On: 03/11/2021 09:58   DG Thoracic Spine W/Swimmers  Result Date: 03/11/2021 CLINICAL DATA:  Thoracic back pain for the past week.  No injury. EXAM: THORACIC SPINE - 3 VIEWS COMPARISON:  Chest x-ray dated December 24, 2019. FINDINGS: Twelve rib-bearing thoracic vertebral bodies. No acute fracture or subluxation. Vertebral body heights are preserved. Alignment is normal. Unchanged mild multilevel disc height loss and anterior endplate spurring throughout the cervical spine. IMPRESSION: 1. No acute osseous abnormality. Unchanged mild thoracic spondylosis. Electronically Signed   By: Titus Dubin M.D.   On: 03/11/2021 10:00     Assessment & Plan:   Abdallah was seen today for cough and back pain.  Diagnoses and all orders for this visit:  Cough productive of purulent sputum- His COVID antigen test is negative.  COVID antibody is insignificantly elevated.  Chest x-ray is negative for mass or infiltrate.  He has a viral URI. -     DG Chest 2 View; Future -     SARS-COV-2 IgG; Future -     POC COVID-19 -     SARS-COV-2 IgG  Acute midline thoracic back pain- Plain films are negative for fracture. -     DG Thoracic Spine W/Swimmers; Future  DOE (dyspnea on exertion)- He has a slightly positive troponin.  D-dimer and BNP are normal.  He saw his cardiologist later in the day and I was told that his EKG was normal. -     Troponin I (High Sensitivity); Future -     D-dimer, quantitative; Future -     Pro b natriuretic peptide (BNP); Future -     Pro b natriuretic peptide (BNP) -     D-dimer, quantitative -     Troponin I (High Sensitivity)   I am having [name] D. Tingler maintain his cetirizine, acetaminophen, tadalafil, Testosterone, valACYclovir, valACYclovir, traZODone, levothyroxine, rosuvastatin, buPROPion, and olmesartan-hydrochlorothiazide.  No orders of the defined types were placed in this encounter.    Follow-up: Return if symptoms worsen or fail to  improve.  Scarlette Calico, MD

## 2021-03-11 NOTE — Progress Notes (Signed)
Primary Physician/Referring:  [name], MD  Referring Bo Merino, MD Patient ID: Jonathan Powers, male    DOB: June 11, 1950, 71 y.o.   MRN: [id number]  Chief Complaint  Patient presents with   Hypertension   HPI:    Jonathan Powers  is a 71 y.o. Caucasian male with history of idiopathic gout, hypertension, mild hypertriglyceridemia and metabolic syndrome with elevated blood sugar without diabetes, moderate obesity, obstructive sleep apnea on CPAP evaluted by me for hypertension and chest pain and has had a Coronary CTA on 11/19/2018 which revealed mild luminal irregularity and a calcium score of 250 (66th MESA database percentile).  He was seen by Dr. Scarlette Calico this morning for an upper respiratory infection, at the same time he mentioned about back pain and also chest pain when he coughs, he had abnormal troponin of 20.  Patient walked into office, requesting an EKG. I worked him in.      Past Medical History:  Diagnosis Date   ALLERGIC RHINITIS    Cancer ([place])    Melonoma back   CELLULITIS, LEG, RIGHT    Recurrent R hip cellulitis 1/08,3/08    Diverticulitis    GERD (gastroesophageal reflux disease)    Gout    Hashimoto's thyroiditis    Hypertension    Hypogonadism male    low T, a/w ED   Hypothyroid    goiters    Left thyroid nodule 2008   on [place], decrease size in 2011 [place]   Migraines    Atypical and ocular   OSTEOARTHRITIS    Seasonal allergies    Sleep apnea    cpap   Past Surgical History:  Procedure Laterality Date   CARPAL TUNNEL RELEASE Left 03/30/2017   Procedure: CARPAL TUNNEL RELEASE;  Surgeon: Garald Balding, MD;  Location: [facility];  Service: Orthopedics;  Laterality: Left;   CHOLECYSTECTOMY  2004   CTR Bilateral [place]   FOOT FUSION Left 06/22/2017   HEMORRHOID SURGERY N/A 03/30/2017   Procedure: HEMORRHOIDECTOMY;  Surgeon: Coralie Keens, MD;  Location: [facility];  Service: General;   Laterality: N/A;   open reduction L little finger Left 1970   Repair digital thumb, left Left 1990   Right shoulder cuff repair Right 09/15/11   Right shoulder SAD, DCR Right 02/05/11   TOTAL HIP ARTHROPLASTY Left 07/26/06   TOTAL HIP ARTHROPLASTY Right 1999   TOTAL HIP REVISION Left 03/07/2019   Procedure: LEFT TOTAL HIP REVISION POSTERIOR APPROACH;  Surgeon: Frederik Pear, MD;  Location: [facility];  Service: Orthopedics;  Laterality: Left;   Social History   Tobacco Use   Smoking status: Never   Smokeless tobacco: Never  Substance Use Topics   Alcohol use: Yes    Alcohol/week: 0.0 standard drinks    Comment: rare   Marital Status: Married   ROS  Review of Systems  Constitutional: Negative for weight gain.  Cardiovascular:  Positive for dyspnea on exertion (stable). Negative for chest pain and leg swelling.  Musculoskeletal:  Positive for arthritis, back pain, joint pain (neck and left shoulder) and neck pain.  Gastrointestinal:  Negative for melena.  Objective  Blood pressure (!) 146/84, pulse 63, temperature 97.8 F (36.6 C), temperature source Temporal, resp. rate 16, height 5\' 10"  (1.778 m), SpO2 97 %. Body mass index is 38.88 kg/m.  Vitals with BMI 03/11/2021 03/11/2021 03/04/2021  Height 5\' 10"  5\' 10"  5\' 10"   Weight - 271 lbs  276 lbs 6 oz  BMI - 48.18 56.31  Systolic 497 026 378  Diastolic 84 68 64  Pulse 63 63 68  Some encounter information is confidential and restricted. Go to Review Flowsheets activity to see all data.     Physical Exam Constitutional:      Appearance: He is obese.  Neck:     Vascular: No carotid bruit or JVD.  Cardiovascular:     Rate and Rhythm: Normal rate and regular rhythm.     Pulses: Intact distal pulses.     Heart sounds: Normal heart sounds. No murmur heard.   No gallop.  Pulmonary:     Effort: Pulmonary effort is normal.     Breath sounds: Normal breath sounds.  Abdominal:     General: Bowel sounds are normal.     Palpations: Abdomen is  soft.  Musculoskeletal:        General: No swelling.   Laboratory examination:   Recent Labs    02/09/21 1021  NA 138  K 3.8  CL 100  CO2 32  GLUCOSE 101*  BUN 11  CREATININE 1.03  CALCIUM 9.8   CMP Latest Ref Rng & Units 02/09/2021 06/29/2020 11/28/2019  Glucose 70 - 99 mg/dL 101(H) - 122(H)  BUN 6 - 23 mg/dL 11 - 14  Creatinine 0.40 - 1.50 mg/dL 1.03 - 0.86  Sodium 135 - 145 mEq/L 138 - 139  Potassium 3.5 - 5.1 mEq/L 3.8 - 3.9  Chloride 96 - 112 mEq/L 100 - 103  CO2 19 - 32 mEq/L 32 - 28  Calcium 8.4 - 10.5 mg/dL 9.8 - 9.5  Total Protein 6.0 - 8.3 g/dL - 6.9 6.7  Total Bilirubin 0.2 - 1.2 mg/dL - 0.6 0.6  Alkaline Phos 39 - 117 U/L - 47 -  AST 0 - 37 U/L - 17 21  ALT 0 - 53 U/L - 20 17   CBC Latest Ref Rng & Units 02/09/2021 06/29/2020 11/28/2019  WBC 4.0 - 10.5 K/uL 6.1 7.6 7.8  Hemoglobin 13.0 - 17.0 g/dL 15.3 15.0 14.8  Hematocrit 39.0 - 52.0 % 43.8 43.6 43.0  Platelets 150.0 - 400.0 K/uL 305.0 309.0 328    Lipid Panel     Component Value Date/Time   CHOL 133 02/09/2021 1021   TRIG 145.0 02/09/2021 1021   HDL 42.50 02/09/2021 1021   CHOLHDL 3 02/09/2021 1021   VLDL 29.0 02/09/2021 1021   LDLCALC 62 02/09/2021 1021   LDLCALC 40 11/19/2018 0931   HEMOGLOBIN A1C Lab Results  Component Value Date   HGBA1C 5.8 02/09/2021   MPG 99.67 11/09/2018   TSH Recent Labs    06/29/20 1159 02/09/21 1021  TSH 1.47 1.24   Medications   Current Outpatient Medications  Medication Instructions   acetaminophen (TYLENOL) 1,000 mg, Oral, Every 6 hours PRN   buPROPion (WELLBUTRIN SR) 150 mg, Oral, 2 times daily, 1 tablet in the morning, 1 tablet in the late afternoon   cetirizine (ZYRTEC) 10 mg, Daily   levothyroxine (SYNTHROID) 175 MCG tablet TAKE 1 TABLET BY MOUTH EVERY DAY BEFORE BREAKFAST   olmesartan-hydrochlorothiazide (BENICAR HCT) 40-25 MG tablet 1 tablet, Oral, Daily, As directed   rosuvastatin (CRESTOR) 5 MG tablet TAKE ONE TABLET BY MOUTH DAILY AT BEDTIME    tadalafil (CIALIS) 5 mg, Oral, Daily at bedtime   Testosterone 1.62 % GEL 3 Pump, Topical, Daily   traZODone (DESYREL) 100 mg, Oral, At bedtime PRN   valACYclovir (VALTREX) 1,000 mg, Oral, 2 times  daily   valACYclovir (VALTREX) 1,000 mg, Oral, 2 times daily   Radiology:   Coronary CTA 11/11/2018:  1. Minimal non-obstructive CAD, CADRADS = 0. 2. Coronary calcium score of 250.78. This was 66th percentile for age and sex matched control. 3. Normal coronary origin with right dominance. 4. Atherosclerotic calcification of the aorta.  Chest x-ray 05/15/2019: Mild right basilar linear atelectasis without active cardiopulmonary disease.  Chest x-ray 08/05/2019:  Poor inspiration. Possible mild right base atelectasis. No active disease suspected otherwise.  Cardiac Studies:   Lexiscan Myoview stress test 09/30/2012: No evidence of ischemia.  Normal   LVEF.  Echocardiogram 12/28/2018:  Left ventricle cavity is normal in size. Mild concentric hypertrophy of the left ventricle. Normal LV systolic function with EF 59%. Normal global wall motion. Doppler evidence of grade I (impaired) diastolic dysfunction, normal LAP.  Left atrial cavity is mildly dilated.  Mild tricuspid regurgitation. Estimated pulmonary artery systolic pressure is 26 mmHg.  IVC is dilated with respiratory variation. Estimated RA pressure 8 mmHg.  Carotid artery duplex 03/18/2020: Doppler velocity suggests stenosis in the right external carotid artery (<50%).  Peak systolic velocities in the left bifurcation, internal, external and common carotid arteries are within normal limits. Antegrade right vertebral artery flow. Antegrade left vertebral artery flow. Follow up appropriate if clinically indicated. Right external carotid stenosis is the source of bruit.   EKG:  EKG 03/11/2021: Normal sinus rhythm at rate of 62 bpm.  No evidence of ischemia, normal QT interval.  Normal EKG.  No change from 03/04/2021.  Assessment      ICD-10-CM   1. Essential hypertension  I10 EKG 12-Lead    2. Abnormal cardiac enzyme level  R74.8       No orders of the defined types were placed in this encounter.  There are no discontinued medications.  Orders Placed This Encounter  Procedures   EKG 12-Lead   Recommendations:   Jonathan Powers  is a 71 y.o. Caucasian male with history of idiopathic gout, hypertension, mild hypertriglyceridemia and metabolic syndrome with elevated blood sugar without diabetes, moderate obesity, obstructive sleep apnea on CPAP evaluted by me for hypertension and chest pain and has had a Coronary CTA on 11/19/2018 which revealed mild luminal irregularity and a calcium score of 250 (66th MESA database percentile).  He was seen by Dr. Scarlette Calico this morning for an upper respiratory infection, at the same time he mentioned about back pain and also chest pain when he coughs, he had abnormal troponin of 20.  Patient walked into office, EKG was performed.  Essentially normal.  His examination is unchanged from prior examination a week ago.  No clinical evidence of heart failure.  I suspect mildly elevated troponin is probably nonspecific in obese patient with acute respiratory infection, he may have a component of diastolic heart failure as well.  I have advised the patient that if he has any symptoms of angina (explained symptoms) that he needs to immediately call me as he has my personal number or go to the emergency room.  He is also advised to contact me if he were to develop worsening dyspnea, PND or orthopnea.  Otherwise stable from cardiac standpoint, I will see him back in a year as previously scheduled.   I discussed with Dr. Scarlette Calico.   Adrian Prows, MD, [facility] 03/11/2021, 3:27 PM Office: [phone] Pager: [phone]

## 2021-03-15 ENCOUNTER — Encounter: Payer: Self-pay | Admitting: Internal Medicine

## 2021-03-18 ENCOUNTER — Ambulatory Visit (INDEPENDENT_AMBULATORY_CARE_PROVIDER_SITE_OTHER): Payer: Medicare Other | Admitting: Internal Medicine

## 2021-03-18 ENCOUNTER — Encounter: Payer: Self-pay | Admitting: Internal Medicine

## 2021-03-18 ENCOUNTER — Other Ambulatory Visit: Payer: Self-pay

## 2021-03-18 VITALS — BP 116/72 | HR 56 | Temp 98.2°F | Ht 70.0 in | Wt 269.0 lb

## 2021-03-18 DIAGNOSIS — I728 Aneurysm of other specified arteries: Secondary | ICD-10-CM | POA: Diagnosis not present

## 2021-03-18 DIAGNOSIS — R10812 Left upper quadrant abdominal tenderness: Secondary | ICD-10-CM | POA: Diagnosis not present

## 2021-03-18 DIAGNOSIS — R634 Abnormal weight loss: Secondary | ICD-10-CM

## 2021-03-18 DIAGNOSIS — K219 Gastro-esophageal reflux disease without esophagitis: Secondary | ICD-10-CM | POA: Diagnosis not present

## 2021-03-18 DIAGNOSIS — I10 Essential (primary) hypertension: Secondary | ICD-10-CM

## 2021-03-18 DIAGNOSIS — R0781 Pleurodynia: Secondary | ICD-10-CM | POA: Diagnosis not present

## 2021-03-18 LAB — URINALYSIS, ROUTINE W REFLEX MICROSCOPIC
Bilirubin Urine: NEGATIVE
Hgb urine dipstick: NEGATIVE
Ketones, ur: NEGATIVE
Leukocytes,Ua: NEGATIVE
Nitrite: NEGATIVE
RBC / HPF: NONE SEEN (ref 0–?)
Specific Gravity, Urine: 1.01 (ref 1.000–1.030)
Total Protein, Urine: NEGATIVE
Urine Glucose: NEGATIVE
Urobilinogen, UA: 0.2 (ref 0.0–1.0)
pH: 5 (ref 5.0–8.0)

## 2021-03-18 LAB — BASIC METABOLIC PANEL
BUN: 15 mg/dL (ref 6–23)
CO2: 32 mEq/L (ref 19–32)
Calcium: 9.3 mg/dL (ref 8.4–10.5)
Chloride: 100 mEq/L (ref 96–112)
Creatinine, Ser: 0.96 mg/dL (ref 0.40–1.50)
GFR: 80.7 mL/min (ref >60.00)
Glucose, Bld: 97 mg/dL (ref 70–99)
Potassium: 3.9 mEq/L (ref 3.5–5.1)
Sodium: 138 mEq/L (ref 135–145)

## 2021-03-18 LAB — HEPATIC FUNCTION PANEL
ALT: 18 U/L (ref 0–53)
AST: 21 U/L (ref 0–37)
Albumin: 4.4 g/dL (ref 3.5–5.2)
Alkaline Phosphatase: 44 U/L (ref 39–117)
Bilirubin, Direct: 0.1 mg/dL (ref 0.0–0.3)
Total Bilirubin: 0.9 mg/dL (ref 0.2–1.2)
Total Protein: 7.1 g/dL (ref 6.0–8.3)

## 2021-03-18 LAB — LIPASE: Lipase: 24 U/L (ref 11.0–59.0)

## 2021-03-18 LAB — AMYLASE: Amylase: 41 U/L (ref 27–131)

## 2021-03-18 NOTE — Patient Instructions (Signed)

## 2021-03-18 NOTE — Progress Notes (Signed)
Subjective:  Patient ID: Jonathan Powers, male    DOB: 1951/01/21  Age: 71 y.o. MRN: [id number]  CC: Abdominal Pain  This visit occurred during the SARS-CoV-2 public health emergency.  Safety protocols were in place, including screening questions prior to the visit, additional usage of staff PPE, and extensive cleaning of exam room while observing appropriate contact time as indicated for disinfecting solutions.    HPI Jonathan Powers presents for f/up -  He continues to feel poorly and is afraid that he may have cancer.  He has had a several month history of left upper quadrant pain with loss of appetite and weight loss.  He continues to complain of cough productive of phlegm with shortness of breath, pleuritic chest pain, and heartburn.  Outpatient Medications Prior to Visit  Medication Sig Dispense Refill   acetaminophen (TYLENOL) 500 MG tablet Take 1,000 mg by mouth every 6 (six) hours as needed for moderate pain.      buPROPion (WELLBUTRIN SR) 150 MG 12 hr tablet Take 1 tablet (150 mg total) by mouth 2 (two) times daily. 1 tablet in the morning, 1 tablet in the late afternoon 60 tablet 3   cetirizine (ZYRTEC) 10 MG tablet Take 10 mg by mouth daily.     levothyroxine (SYNTHROID) 175 MCG tablet TAKE 1 TABLET BY MOUTH EVERY DAY BEFORE BREAKFAST 90 tablet 0   olmesartan-hydrochlorothiazide (BENICAR HCT) 40-25 MG tablet Take 1 tablet by mouth daily. As directed 90 tablet 3   rosuvastatin (CRESTOR) 5 MG tablet TAKE ONE TABLET BY MOUTH DAILY AT BEDTIME 90 tablet 0   tadalafil (CIALIS) 5 MG tablet Take 5 mg by mouth at bedtime.     Testosterone 1.62 % GEL Apply 3 Pump topically daily.     traZODone (DESYREL) 100 MG tablet Take 1 tablet (100 mg total) by mouth at bedtime as needed for sleep. 90 tablet 1   valACYclovir (VALTREX) 1000 MG tablet Take 1 tablet (1,000 mg total) by mouth 2 (two) times daily. (Patient taking differently: Take 1,000 mg by mouth as needed.) 14 tablet 5   valACYclovir  (VALTREX) 1000 MG tablet Take 1 tablet (1,000 mg total) by mouth 2 (two) times daily. 30 tablet 0   No facility-administered medications prior to visit.    ROS Review of Systems  Constitutional:  Positive for appetite change and unexpected weight change (wt loss). Negative for chills, diaphoresis, fatigue and fever.  HENT: Negative.  Negative for sore throat and trouble swallowing.   Eyes: Negative.   Respiratory:  Positive for cough and shortness of breath. Negative for chest tightness and wheezing.   Cardiovascular:  Positive for chest pain. Negative for palpitations and leg swelling.  Gastrointestinal:  Positive for abdominal pain. Negative for constipation, diarrhea, nausea and vomiting.  Genitourinary: Negative.  Negative for difficulty urinating, dysuria and hematuria.  Musculoskeletal: Negative.   Skin: Negative.  Negative for rash.  Neurological: Negative.  Negative for dizziness, weakness, light-headedness and headaches.  Hematological:  Negative for adenopathy. Does not bruise/bleed easily.  Psychiatric/Behavioral: Negative.     Objective:  BP 116/72 (BP Location: Right Arm, Patient Position: Sitting, Cuff Size: Large)    Pulse (!) 56    Temp 98.2 F (36.8 C) (Oral)    Ht 5\' 10"  (1.778 m)    Wt 269 lb (122 kg)    SpO2 97%    BMI 38.60 kg/m   BP Readings from Last 3 Encounters:  03/18/21 116/72  03/11/21 (!) 146/84  03/11/21  118/68    Wt Readings from Last 3 Encounters:  03/18/21 269 lb (122 kg)  03/11/21 271 lb (122.9 kg)  03/04/21 276 lb 6.4 oz (125.4 kg)    Physical Exam Vitals reviewed.  Constitutional:      Appearance: He is obese. He is not ill-appearing.  HENT:     Nose: Nose normal.     Mouth/Throat:     Mouth: Mucous membranes are moist.  Eyes:     General: No scleral icterus.    Conjunctiva/sclera: Conjunctivae normal.  Cardiovascular:     Rate and Rhythm: Normal rate and regular rhythm.     Heart sounds: No murmur heard. Pulmonary:     Effort:  Pulmonary effort is normal.     Breath sounds: No stridor. No wheezing, rhonchi or rales.  Abdominal:     General: Abdomen is protuberant. Bowel sounds are normal.     Palpations: Abdomen is soft.     Tenderness: There is abdominal tenderness in the left upper quadrant. There is no guarding or rebound.     Hernia: No hernia is present.  Musculoskeletal:        General: Normal range of motion.     Cervical back: Neck supple.     Right lower leg: No edema.     Left lower leg: No edema.  Lymphadenopathy:     Cervical: No cervical adenopathy.  Skin:    General: Skin is warm and dry.     Findings: No lesion or rash.  Neurological:     General: No focal deficit present.     Mental Status: He is alert.  Psychiatric:        Mood and Affect: Mood normal.    Lab Results  Component Value Date   WBC 6.1 02/09/2021   HGB 15.3 02/09/2021   HCT 43.8 02/09/2021   PLT 305.0 02/09/2021   GLUCOSE 97 03/18/2021   CHOL 133 02/09/2021   TRIG 145.0 02/09/2021   HDL 42.50 02/09/2021   LDLCALC 62 02/09/2021   ALT 18 03/18/2021   AST 21 03/18/2021   NA 138 03/18/2021   K 3.9 03/18/2021   CL 100 03/18/2021   CREATININE 0.96 03/18/2021   BUN 15 03/18/2021   CO2 32 03/18/2021   TSH 1.24 02/09/2021   PSA 0.880 08/30/2020   INR 1.0 03/05/2019   HGBA1C 5.8 02/09/2021    MR Lumbar Spine w/o contrast  Result Date: 01/10/2021 CLINICAL DATA:  Low back pain, > 6 wks EXAM: MRI LUMBAR SPINE WITHOUT CONTRAST TECHNIQUE: Multiplanar, multisequence MR imaging of the lumbar spine was performed. No intravenous contrast was administered. COMPARISON:  September 02, 2018. FINDINGS: Segmentation:  Standard. Alignment: Similar trace retrolisthesis of T12 on L1, L1 on L2, L2-L2 on L3. Vertebrae: Degenerative/discogenic endplate signal changes at multiple levels, greatest at L4-L5 and L2-L3. No specific evidence of acute fracture or discitis/osteomyelitis. Mildly heterogeneous bone marrow without suspicious bone lesion.  Conus medullaris and cauda equina: Conus extends to the L1-L2 level. Conus appears normal. Paraspinal and other soft tissues: A T2 hyperintense left renal lesion is again visualized, likely a cyst. Disc levels: T12-L1: Similar disc bulge, ligamentum flavum thickening facet hypertrophy with mild canal stenosis. Patent foramina. L1-L2: Mild disc bulging.  Patent canal and foramina.  Similar. L2-L3: Mild disc bulging, facet arthropathy and ligamentum flavum thickening with similar borderline mild canal and bilateral subarticular recess stenosis. Patent foramina. L3-L4: Mild disc bulging and facet hypertrophy. Similar versus mildly progressed mild to moderate left foraminal  stenosis. Similar mild bilateral subarticular recess narrowing. Patent right foramen. L4-L5: Progressive disc height loss. Posterior disc bulge and endplate spurring. Ligamentum flavum thickening. Similar mild canal stenosis. Similar versus slightly progressed moderate bilateral foraminal stenosis. L5-S1: Asymmetric right facet arthropathy with facet joint effusion. Previously seen right facet joint synovial cyst is not confidently visualized and probably is decreased or resolved. Right foraminal stenosis is improved, now mild to moderate. No significant canal stenosis. Similar mild left foraminal stenosis. LS catheter allergy a CT IMPRESSION: 1. At L4-L5, progressive degenerative disc disease with similar versus mildly progressed moderate bilateral foraminal stenosis. Similar mild canal stenosis. 2. At L5-S1, previously seen right facet joint synovial cyst is not confidently visualized and probably is decreased or resolved. Right foraminal stenosis is improved, now mild to moderate. 3. At L3-L4, similar versus slightly progressed mild to moderate left foraminal stenosis with mild bilateral subarticular recess stenosis. Electronically Signed   By: Margaretha Sheffield M.D.   On: 01/10/2021 12:42    Assessment & Plan:   Rc was seen today for  abdominal pain.  Diagnoses and all orders for this visit:  Left upper quadrant abdominal tenderness without rebound tenderness- His labs are reassuring.  Will screen for occult infection and malignancy. -     Lipase; Future -     Amylase; Future -     Basic metabolic panel; Future -     Hepatic function panel; Future -     Urinalysis, Routine w reflex microscopic; Future -     CT Abdomen Pelvis W Contrast; Future -     Urinalysis, Routine w reflex microscopic -     Hepatic function panel -     Basic metabolic panel -     Amylase -     Lipase  Superior mesenteric artery aneurysm (HCC) -     CT Abdomen Pelvis W Contrast; Future  Chest pain, pleuritic-Will screen for malignancy and occult infection. -     CT Chest W Contrast; Future  Essential hypertension- His blood pressure is adequately well controlled. -     Basic metabolic panel; Future -     Hepatic function panel; Future -     Urinalysis, Routine w reflex microscopic; Future -     Urinalysis, Routine w reflex microscopic -     Hepatic function panel -     Basic metabolic panel  Weight loss, abnormal -     CT Chest W Contrast; Future -     CT Abdomen Pelvis W Contrast; Future  Gastroesophageal reflux disease without esophagitis -     esomeprazole (NEXIUM) 40 MG capsule; Take 1 capsule (40 mg total) by mouth daily.   I am having Sadrac Zeoli. [place] start on esomeprazole. I am also having him maintain his cetirizine, acetaminophen, tadalafil, Testosterone, valACYclovir, valACYclovir, traZODone, levothyroxine, rosuvastatin, buPROPion, and olmesartan-hydrochlorothiazide.  Meds ordered this encounter  Medications   esomeprazole (NEXIUM) 40 MG capsule    Sig: Take 1 capsule (40 mg total) by mouth daily.    Dispense:  90 capsule    Refill:  1     Follow-up: Return in about 6 weeks (around 04/29/2021).  Scarlette Calico, MD

## 2021-03-19 MED ORDER — ESOMEPRAZOLE MAGNESIUM 40 MG PO CPDR: 40.0000 mg | DELAYED_RELEASE_CAPSULE | Freq: Every day | ORAL | 1 refills | Status: DC

## 2021-03-25 ENCOUNTER — Encounter: Payer: Self-pay | Admitting: Internal Medicine

## 2021-03-26 ENCOUNTER — Other Ambulatory Visit: Payer: Self-pay | Admitting: Internal Medicine

## 2021-03-26 DIAGNOSIS — E039 Hypothyroidism, unspecified: Secondary | ICD-10-CM

## 2021-03-28 ENCOUNTER — Other Ambulatory Visit: Payer: Self-pay | Admitting: Internal Medicine

## 2021-03-28 DIAGNOSIS — E039 Hypothyroidism, unspecified: Secondary | ICD-10-CM

## 2021-03-28 MED ORDER — LEVOTHYROXINE SODIUM 175 MCG PO TABS: 175.0000 ug | ORAL_TABLET | Freq: Every day | ORAL | 0 refills | Status: DC

## 2021-04-06 ENCOUNTER — Ambulatory Visit
Admission: RE | Admit: 2021-04-06 | Discharge: 2021-04-06 | Disposition: A | Discharge status: Home / Self Care | Payer: Medicare Other | Source: Ambulatory Visit | Attending: Internal Medicine | Admitting: Internal Medicine

## 2021-04-06 ENCOUNTER — Other Ambulatory Visit: Payer: Self-pay

## 2021-04-06 DIAGNOSIS — R10812 Left upper quadrant abdominal tenderness: Secondary | ICD-10-CM

## 2021-04-06 DIAGNOSIS — K429 Umbilical hernia without obstruction or gangrene: Secondary | ICD-10-CM | POA: Diagnosis not present

## 2021-04-06 DIAGNOSIS — R634 Abnormal weight loss: Secondary | ICD-10-CM | POA: Diagnosis not present

## 2021-04-06 DIAGNOSIS — K5730 Diverticulosis of large intestine without perforation or abscess without bleeding: Secondary | ICD-10-CM | POA: Diagnosis not present

## 2021-04-06 DIAGNOSIS — I774 Celiac artery compression syndrome: Secondary | ICD-10-CM | POA: Diagnosis not present

## 2021-04-06 DIAGNOSIS — I2510 Atherosclerotic heart disease of native coronary artery without angina pectoris: Secondary | ICD-10-CM | POA: Diagnosis not present

## 2021-04-06 DIAGNOSIS — K4020 Bilateral inguinal hernia, without obstruction or gangrene, not specified as recurrent: Secondary | ICD-10-CM | POA: Diagnosis not present

## 2021-04-06 DIAGNOSIS — I700 Atherosclerosis of aorta: Secondary | ICD-10-CM | POA: Diagnosis not present

## 2021-04-06 DIAGNOSIS — I728 Aneurysm of other specified arteries: Secondary | ICD-10-CM

## 2021-04-06 MED ORDER — IOPAMIDOL (ISOVUE-300) INJECTION 61%
100.0000 mL | Freq: Once | INTRAVENOUS | Status: AC | PRN
  Administered 2021-04-06: 100 mL via INTRAVENOUS

## 2021-04-07 DIAGNOSIS — N5201 Erectile dysfunction due to arterial insufficiency: Secondary | ICD-10-CM | POA: Diagnosis not present

## 2021-04-07 DIAGNOSIS — R948 Abnormal results of function studies of other organs and systems: Secondary | ICD-10-CM | POA: Diagnosis not present

## 2021-04-07 DIAGNOSIS — E291 Testicular hypofunction: Secondary | ICD-10-CM | POA: Diagnosis not present

## 2021-04-19 DIAGNOSIS — J310 Chronic rhinitis: Secondary | ICD-10-CM | POA: Diagnosis not present

## 2021-04-19 DIAGNOSIS — J342 Deviated nasal septum: Secondary | ICD-10-CM | POA: Diagnosis not present

## 2021-04-19 DIAGNOSIS — J3489 Other specified disorders of nose and nasal sinuses: Secondary | ICD-10-CM | POA: Diagnosis not present

## 2021-04-19 DIAGNOSIS — J329 Chronic sinusitis, unspecified: Secondary | ICD-10-CM | POA: Diagnosis not present

## 2021-04-19 DIAGNOSIS — J0141 Acute recurrent pansinusitis: Secondary | ICD-10-CM | POA: Diagnosis not present

## 2021-04-19 DIAGNOSIS — H903 Sensorineural hearing loss, bilateral: Secondary | ICD-10-CM | POA: Diagnosis not present

## 2021-04-19 DIAGNOSIS — J343 Hypertrophy of nasal turbinates: Secondary | ICD-10-CM | POA: Diagnosis not present

## 2021-04-22 ENCOUNTER — Encounter: Payer: Self-pay | Admitting: Internal Medicine

## 2021-04-25 ENCOUNTER — Other Ambulatory Visit: Payer: Self-pay | Admitting: Internal Medicine

## 2021-04-25 DIAGNOSIS — G4700 Insomnia, unspecified: Secondary | ICD-10-CM

## 2021-04-25 DIAGNOSIS — E785 Hyperlipidemia, unspecified: Secondary | ICD-10-CM

## 2021-04-25 MED ORDER — ROSUVASTATIN CALCIUM 5 MG PO TABS: 5.0000 mg | ORAL_TABLET | Freq: Every day | ORAL | 1 refills | Status: DC

## 2021-04-25 MED ORDER — TRAZODONE HCL 100 MG PO TABS: 100.0000 mg | ORAL_TABLET | Freq: Every evening | ORAL | 1 refills | Status: DC | PRN

## 2021-04-26 ENCOUNTER — Ambulatory Visit (INDEPENDENT_AMBULATORY_CARE_PROVIDER_SITE_OTHER): Payer: Medicare Other | Admitting: Physician Assistant

## 2021-04-26 ENCOUNTER — Encounter: Payer: Self-pay | Admitting: Physician Assistant

## 2021-04-26 ENCOUNTER — Other Ambulatory Visit: Payer: Self-pay

## 2021-04-26 VITALS — BP 114/68 | HR 74 | Ht 71.0 in | Wt 268.0 lb

## 2021-04-26 DIAGNOSIS — G3184 Mild cognitive impairment, so stated: Secondary | ICD-10-CM | POA: Diagnosis not present

## 2021-04-26 MED ORDER — MEMANTINE HCL 5 MG PO TABS: ORAL_TABLET | ORAL | 11 refills | Status: DC
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
## 2021-04-26 NOTE — Patient Instructions (Addendum)
It was a pleasure to see you today at our office.   Recommendations:  Follow up in 6 months Start Memantine 5 mg tablets.  Take 1 tablet at bedtime for 2 weeks, then 1 tablet twice daily.   Side effects include dizziness, headache, diarrhea or constipation.  Call with any questions or concerns.      RECOMMENDATIONS FOR ALL PATIENTS WITH MEMORY PROBLEMS: 1. Continue to exercise (Recommend 30 minutes of walking everyday, or 3 hours every week) 2. Increase social interactions - continue going to [place] and enjoy social gatherings with friends and family 3. Eat healthy, avoid fried foods and eat more fruits and vegetables 4. Maintain adequate blood pressure, blood sugar, and blood cholesterol level. Reducing the risk of stroke and cardiovascular disease also helps promoting better memory. 5. Avoid stressful situations. Live a simple life and avoid aggravations. Organize your time and prepare for the next day in anticipation. 6. Sleep well, avoid any interruptions of sleep and avoid any distractions in the bedroom that may interfere with adequate sleep quality 7. Avoid sugar, avoid sweets as there is a strong link between excessive sugar intake, diabetes, and cognitive impairment We discussed the Mediterranean diet, which has been shown to help patients reduce the risk of progressive memory disorders and reduces cardiovascular risk. This includes eating fish, eat fruits and green leafy vegetables, nuts like almonds and hazelnuts, walnuts, and also use olive oil. Avoid fast foods and fried foods as much as possible. Avoid sweets and sugar as sugar use has been linked to worsening of memory function.  There is always a concern of gradual progression of memory problems. If this is the case, then we may need to adjust level of care according to patient needs. Support, both to the patient and caregiver, should then be put into place.     FALL PRECAUTIONS: Be cautious when walking. Scan the area for  obstacles that may increase the risk of trips and falls. When getting up in the mornings, sit up at the edge of the bed for a few minutes before getting out of bed. Consider elevating the bed at the head end to avoid drop of blood pressure when getting up. Walk always in a well-lit room (use night lights in the walls). Avoid area rugs or power cords from appliances in the middle of the walkways. Use a walker or a cane if necessary and consider physical therapy for balance exercise. Get your eyesight checked regularly.  FINANCIAL OVERSIGHT: Supervision, especially oversight when making financial decisions or transactions is also recommended.  HOME SAFETY: Consider the safety of the kitchen when operating appliances like stoves, microwave oven, and blender. Consider having supervision and share cooking responsibilities until no longer able to participate in those. Accidents with firearms and other hazards in the house should be identified and addressed as well.   ABILITY TO BE LEFT ALONE: If patient is unable to contact 911 operator, consider using LifeLine, or when the need is there, arrange for someone to stay with patients. Smoking is a fire hazard, consider supervision or cessation. Risk of wandering should be assessed by caregiver and if detected at any point, supervision and safe proof recommendations should be instituted.  MEDICATION SUPERVISION: Inability to self-administer medication needs to be constantly addressed. Implement a mechanism to ensure safe administration of the medications.   DRIVING: Regarding driving, in patients with progressive memory problems, driving will be impaired. We advise to have someone else do the driving if trouble finding directions or if  minor accidents are reported. Independent driving assessment is available to determine safety of driving.   If you are interested in the driving assessment, you can contact the following:  [employer] in [place]  [place] [phone] or [phone]    [place] refers to food and lifestyle choices that are based on the traditions of countries located on the [employer]. This way of eating has been shown to help prevent certain conditions and improve outcomes for people who have chronic diseases, like kidney disease and heart disease. What are tips for following this plan? Lifestyle  Cook and eat meals together with your family, when possible. Drink enough fluid to keep your urine clear or pale yellow. Be physically active every day. This includes: Aerobic exercise like running or swimming. Leisure activities like gardening, walking, or housework. Get 7-8 hours of sleep each night. If recommended by your health care provider, drink red wine in moderation. This means 1 glass a day for nonpregnant women and 2 glasses a day for men. A glass of wine equals 5 oz (150 mL). Reading food labels  Check the serving size of packaged foods. For foods such as rice and pasta, the serving size refers to the amount of cooked product, not dry. Check the total fat in packaged foods. Avoid foods that have saturated fat or trans fats. Check the ingredients list for added sugars, such as corn syrup. Shopping  At the grocery store, buy most of your food from the areas near the walls of the store. This includes: Fresh fruits and vegetables (produce). Grains, beans, nuts, and seeds. Some of these may be available in unpackaged forms or large amounts (in bulk). Fresh seafood. Poultry and eggs. Low-fat dairy products. Buy whole ingredients instead of prepackaged foods. Buy fresh fruits and vegetables in-season from local farmers markets. Buy frozen fruits and vegetables in resealable bags. If you do not have access to quality fresh seafood, buy precooked frozen shrimp or canned  fish, such as tuna, salmon, or sardines. Buy small amounts of raw or cooked vegetables, salads, or olives from the deli or salad bar at your store. Stock your pantry so you always have certain foods on hand, such as olive oil, canned tuna, canned tomatoes, rice, pasta, and beans. Cooking  Cook foods with extra-virgin olive oil instead of using butter or other vegetable oils. Have meat as a side dish, and have vegetables or grains as your main dish. This means having meat in small portions or adding small amounts of meat to foods like pasta or stew. Use beans or vegetables instead of meat in common dishes like chili or lasagna. Experiment with different cooking methods. Try roasting or broiling vegetables instead of steaming or sauteing them. Add frozen vegetables to soups, stews, pasta, or rice. Add nuts or seeds for added healthy fat at each meal. You can add these to yogurt, salads, or vegetable dishes. Marinate fish or vegetables using olive oil, lemon juice, garlic, and fresh herbs. Meal planning  Plan to eat 1 vegetarian meal one day each week. Try to work up to 2 vegetarian meals, if possible. Eat seafood 2 or more times a week. Have healthy snacks readily available, such as: Vegetable sticks with hummus. Greek yogurt. Fruit and nut trail mix. Eat balanced meals throughout the week. This includes: Fruit: 2-3 servings a day Vegetables: 4-5 servings a day Low-fat dairy: 2 servings a  day Fish, poultry, or lean meat: 1 serving a day Beans and legumes: 2 or more servings a week Nuts and seeds: 1-2 servings a day Whole grains: 6-8 servings a day Extra-virgin olive oil: 3-4 servings a day Limit red meat and sweets to only a few servings a month What are my food choices? Mediterranean diet Recommended Grains: Whole-grain pasta. Brown rice. Bulgar wheat. Polenta. Couscous. Whole-wheat bread. [place]. Vegetables: Artichokes. Beets. Broccoli. Cabbage. Carrots. Eggplant. Green  beans. Chard. Kale. Spinach. Onions. Leeks. Peas. Squash. Tomatoes. Peppers. Radishes. Fruits: Apples. Apricots. Avocado. Berries. Bananas. Cherries. Dates. Figs. Grapes. Lemons. Melon. Oranges. Peaches. Plums. Pomegranate. Meats and other protein foods: Beans. Almonds. Sunflower seeds. Pine nuts. Peanuts. [place]. Salmon. Scallops. Shrimp. [place]. Tilapia. Clams. Oysters. Eggs. Dairy: Low-fat milk. Cheese. Greek yogurt. Beverages: Water. Red wine. Herbal tea. Fats and oils: Extra virgin olive oil. Avocado oil. Grape seed oil. Sweets and desserts: [place] yogurt with honey. Baked apples. Poached pears. Trail mix. Seasoning and other foods: Basil. Cilantro. Coriander. Cumin. Mint. Parsley. Sage. Rosemary. Tarragon. Garlic. Oregano. Thyme. Pepper. Balsalmic vinegar. Tahini. Hummus. Tomato sauce. Olives. Mushrooms. Limit these Grains: Prepackaged pasta or rice dishes. Prepackaged cereal with added sugar. Vegetables: Deep fried potatoes (french fries). Fruits: Fruit canned in syrup. Meats and other protein foods: Beef. Pork. Lamb. Poultry with skin. Hot dogs. [place] Salines. Dairy: Ice cream. Sour cream. Whole milk. Beverages: Juice. Sugar-sweetened soft drinks. Beer. Liquor and spirits. Fats and oils: Butter. Canola oil. Vegetable oil. Beef fat (tallow). Lard. Sweets and desserts: Cookies. Cakes. Pies. Candy. Seasoning and other foods: Mayonnaise. Premade sauces and marinades. The items listed may not be a complete list. Talk with your dietitian about what dietary choices are right for you. Summary The Mediterranean diet includes both food and lifestyle choices. Eat a variety of fresh fruits and vegetables, beans, nuts, seeds, and whole grains. Limit the amount of red meat and sweets that you eat. Talk with your health care provider about whether it is safe for you to drink red wine in moderation. This means 1 glass a day for nonpregnant women and 2 glasses a day for men. A glass of wine equals 5 oz (150  mL). This information is not intended to replace advice given to you by your health care provider. Make sure you discuss any questions you have with your health care provider. Document Released: 09/30/2015 Document Revised: 11/02/2015 Document Reviewed: 09/30/2015 Elsevier Interactive Patient Education  2017 [employer].

## 2021-05-02 DIAGNOSIS — Z20822 Contact with and (suspected) exposure to covid-19: Secondary | ICD-10-CM | POA: Diagnosis not present

## 2021-05-03 ENCOUNTER — Other Ambulatory Visit: Payer: Self-pay | Admitting: Internal Medicine

## 2021-05-03 DIAGNOSIS — E785 Hyperlipidemia, unspecified: Secondary | ICD-10-CM

## 2021-05-12 ENCOUNTER — Other Ambulatory Visit: Payer: Self-pay | Admitting: Physician Assistant

## 2021-05-12 MED ORDER — DOXYCYCLINE HYCLATE 100 MG PO TABS: 100.0000 mg | ORAL_TABLET | Freq: Two times a day (BID) | ORAL | 0 refills | Status: DC

## 2021-05-16 ENCOUNTER — Encounter: Payer: Self-pay | Admitting: Orthopedic Surgery

## 2021-05-16 ENCOUNTER — Ambulatory Visit (INDEPENDENT_AMBULATORY_CARE_PROVIDER_SITE_OTHER): Payer: Medicare Other | Admitting: Orthopedic Surgery

## 2021-05-16 DIAGNOSIS — L309 Dermatitis, unspecified: Secondary | ICD-10-CM | POA: Diagnosis not present

## 2021-05-16 NOTE — Progress Notes (Signed)
? ?Office Visit Note ?  ?Patient: Jonathan Powers           ?Date of Birth: 1950/08/02           ?MRN: [id number] ?Visit Date: 05/16/2021 ?             ?Requested by: [name], MD ?[place],  [place] [address] ?PCP: [name], MD ? ?Chief Complaint  ?Patient presents with  ? Right Foot - Pain  ? Left Foot - Pain  ? ? ? ? ?HPI: ?Patient is a 71 year old gentleman who presents in follow-up for fungal infection between his toes of both feet.  Patient has been on antibiotics he states he developed some blistering from the antibiotics.  He has been using the pieces of V[place] where sock between his toes. ? ?Assessment & Plan: ?Visit Diagnoses:  ?1. Dermatitis of left foot   ? ? ?Plan: Patient will continue using the sock between the toes to resolve the remaining ulceration.  Patient has made excellent improvement he will call if there is any changes. ? ?Follow-Up Instructions: Return if symptoms worsen or fail to improve.  ? ?Ortho Exam ? ?Patient is alert, oriented, no adenopathy, well-dressed, normal affect, normal respiratory effort. ?Examination almost all of the ulcers are completely healed.  There is no redness no cellulitis no drainage patient has 1 interdigital webspace that has a small amount of maceration.  Patient has made excellent improvement. ? ?Imaging: ?No results found. ?No images are attached to the encounter. ? ?Labs: ?Lab Results  ?Component Value Date  ? HGBA1C 5.8 02/09/2021  ? HGBA1C 5.8 06/29/2020  ? HGBA1C 5.1 11/09/2018  ? ESRSEDRATE 9 07/09/2018  ? ESRSEDRATE 9 01/03/2017  ? ESRSEDRATE 10 04/13/2016  ? CRP 2.2 07/09/2018  ? LABURIC 9.0 (H) 11/28/2019  ? LABURIC 8.1 (H) 07/09/2018  ? LABURIC 6.7 02/05/2018  ? REPTSTATUS 08/06/2019 FINAL 08/05/2019  ? CULT  08/05/2019  ?  NO GROWTH ?Performed at [facility], [place] [address] [place], [place] [address] ?  ? ? ? ?Lab Results  ?Component Value Date  ? ALBUMIN 4.4 03/18/2021  ? ALBUMIN 4.2 06/29/2020  ? ALBUMIN 4.4  07/16/2019  ? ? ?No results found for: MG ?Lab Results  ?Component Value Date  ? VD25OH 55 07/09/2018  ? VD25OH 54 04/04/2018  ? VD25OH 46 03/01/2016  ? ? ?No results found for: PREALBUMIN ? ?  Latest Ref Rng & Units 02/09/2021  ? 10:21 AM 06/29/2020  ? 11:59 AM 11/28/2019  ?  1:57 PM  ?CBC EXTENDED  ?WBC 4.0 - 10.5 K/uL 6.1   7.6   7.8    ?RBC 4.22 - 5.81 Mil/uL 5.02   4.89   4.83    ?Hemoglobin 13.0 - 17.0 g/dL 15.3   15.0   14.8    ?HCT 39.0 - 52.0 % 43.8   43.6   43.0    ?Platelets 150.0 - 400.0 K/uL 305.0   309.0   328    ?NEUT# 1.4 - 7.7 K/uL 3.8   4.9   4,649    ?Lymph# 0.7 - 4.0 K/uL 1.5   1.7   2,098    ? ? ? ?There is no height or weight on file to calculate BMI. ? ?Orders:  ?No orders of the defined types were placed in this encounter. ? ?No orders of the defined types were placed in this encounter. ? ? ? Procedures: ?No procedures performed ? ?Clinical Data: ?  No additional findings. ? ?ROS: ? ?All other systems negative, except as noted in the HPI. ?Review of Systems ? ?Objective: ?Vital Signs: There were no vitals taken for this visit. ? ?Specialty Comments:  ?No specialty comments available. ? ?PMFS History: ?Patient Active Problem List  ? Diagnosis Date Noted  ? Left upper quadrant abdominal tenderness without rebound tenderness 03/18/2021  ? Chest pain, pleuritic 03/18/2021  ? Amnestic MCI (mild cognitive impairment with memory loss) 10/08/2020  ? Memory deficit 06/29/2020  ? Chronic hyperglycemia 06/29/2020  ? Failed total hip arthroplasty ([place]) 03/06/2019  ? Recurrent genital HSV (herpes simplex virus) infection 11/12/2018  ? Other specified hypothyroidism 11/11/2018  ? Insomnia 11/10/2018  ? GERD (gastroesophageal reflux disease) 11/10/2018  ? Seasonal allergies 11/10/2018  ? Recurrent oral herpes simplex 09/23/2018  ? Age-related osteoporosis with current pathological fracture 03/11/2018  ? Flexural eczema 07/24/2017  ? Chronic right-sided low back pain without sciatica 07/21/2016  ? Superior  mesenteric artery aneurysm ([place]) 07/12/2015  ? Left thyroid nodule   ? OSA (obstructive sleep apnea) 01/27/2013  ? Hypothyroid   ? Symptomatic PVCs 09/06/2012  ? Dyslipidemia, goal LDL below 130 09/06/2012  ? BPH (benign prostatic hyperplasia) 09/06/2012  ? Essential hypertension 05/17/2006  ? ALLERGIC RHINITIS 05/17/2006  ? OSTEOARTHRITIS 05/17/2006  ? ?Past Medical History:  ?Diagnosis Date  ? ALLERGIC RHINITIS   ? Cancer [facility])   ? Melonoma back  ? CELLULITIS, LEG, RIGHT   ? Recurrent R hip cellulitis 1/08,3/08   ? Diverticulitis   ? GERD (gastroesophageal reflux disease)   ? Gout   ? Hashimoto's thyroiditis   ? Hypertension   ? Hypogonadism male   ? low T, a/w ED  ? Hypothyroid   ? goiters   ? Left thyroid nodule 2008  ? on [place], decrease size in 2011 [place]  ? Migraines   ? Atypical and ocular  ? OSTEOARTHRITIS   ? Seasonal allergies   ? Sleep apnea   ? cpap  ?  ?Family History  ?Problem Relation Age of Onset  ? Arthritis Father   ? Heart disease Father   ? Diabetes Father   ? Vascular Disease Father   ?     AoBifem  ? Arthritis Mother   ? Diabetes Mother   ? Multiple sclerosis Mother   ? Cancer Paternal Grandmother   ?     "GI"  ? Cancer Paternal Grandfather   ?     stomach cancer  ? Thyroid cancer Sister   ? Uterine cancer Sister   ?  ?Past Surgical History:  ?Procedure Laterality Date  ? CARPAL TUNNEL RELEASE Left 03/30/2017  ? Procedure: CARPAL TUNNEL RELEASE;  Surgeon: Garald Balding, MD;  Location: [facility];  Service: Orthopedics;  Laterality: Left;  ? CHOLECYSTECTOMY  2004  ? CTR Bilateral 1982  ? CYSTOSCOPY  1974  ? FOOT FUSION Left 06/22/2017  ? HEMORRHOID SURGERY N/A 03/30/2017  ? Procedure: HEMORRHOIDECTOMY;  Surgeon: Coralie Keens, MD;  Location: [facility];  Service: General;  Laterality: N/A;  ? open reduction L little finger Left 1970  ? Repair digital thumb, left Left 1990  ? Right shoulder cuff repair Right 09/15/11  ? Right shoulder SAD, DCR Right 02/05/11  ? TOTAL  HIP ARTHROPLASTY Left 07/26/06  ? TOTAL HIP ARTHROPLASTY Right 1999  ? TOTAL HIP REVISION Left 03/07/2019  ? Procedure: LEFT TOTAL HIP REVISION POSTERIOR APPROACH;  Surgeon: Frederik Pear, MD;  Location: [facility];  Service: Orthopedics;  Laterality: Left;  ? ?Social History  ? ?Occupational History  ? Occupation: Orthopedic PA  ?  Employer: [employer]  ?Tobacco Use  ? Smoking status: Never  ? Smokeless tobacco: Never  ?Vaping Use  ? Vaping Use: Never used  ?Substance and Sexual Activity  ? Alcohol use: Yes  ?  Alcohol/week: 0.0 standard drinks  ?  Comment: rare  ? Drug use: No  ? Sexual activity: Yes  ? ? ? ? ? ?

## 2021-05-18 DIAGNOSIS — Z20822 Contact with and (suspected) exposure to covid-19: Secondary | ICD-10-CM | POA: Diagnosis not present

## 2021-05-20 DIAGNOSIS — B009 Herpesviral infection, unspecified: Secondary | ICD-10-CM | POA: Diagnosis not present

## 2021-05-23 DIAGNOSIS — Z20822 Contact with and (suspected) exposure to covid-19: Secondary | ICD-10-CM | POA: Diagnosis not present

## 2021-05-25 DIAGNOSIS — L821 Other seborrheic keratosis: Secondary | ICD-10-CM | POA: Diagnosis not present

## 2021-05-25 DIAGNOSIS — D485 Neoplasm of uncertain behavior of skin: Secondary | ICD-10-CM | POA: Diagnosis not present

## 2021-05-25 DIAGNOSIS — D225 Melanocytic nevi of trunk: Secondary | ICD-10-CM | POA: Diagnosis not present

## 2021-05-25 DIAGNOSIS — B353 Tinea pedis: Secondary | ICD-10-CM | POA: Diagnosis not present

## 2021-05-25 DIAGNOSIS — D2239 Melanocytic nevi of other parts of face: Secondary | ICD-10-CM | POA: Diagnosis not present

## 2021-05-28 ENCOUNTER — Other Ambulatory Visit: Payer: Self-pay | Admitting: Internal Medicine

## 2021-05-28 DIAGNOSIS — G4730 Sleep apnea, unspecified: Secondary | ICD-10-CM

## 2021-06-19 DIAGNOSIS — Z20822 Contact with and (suspected) exposure to covid-19: Secondary | ICD-10-CM | POA: Diagnosis not present

## 2021-06-21 DIAGNOSIS — H35033 Hypertensive retinopathy, bilateral: Secondary | ICD-10-CM | POA: Diagnosis not present

## 2021-06-21 DIAGNOSIS — H40013 Open angle with borderline findings, low risk, bilateral: Secondary | ICD-10-CM | POA: Diagnosis not present

## 2021-06-21 DIAGNOSIS — H04123 Dry eye syndrome of bilateral lacrimal glands: Secondary | ICD-10-CM | POA: Diagnosis not present

## 2021-06-21 DIAGNOSIS — H43393 Other vitreous opacities, bilateral: Secondary | ICD-10-CM | POA: Diagnosis not present

## 2021-06-21 DIAGNOSIS — H0102B Squamous blepharitis left eye, upper and lower eyelids: Secondary | ICD-10-CM | POA: Diagnosis not present

## 2021-06-21 DIAGNOSIS — H0102A Squamous blepharitis right eye, upper and lower eyelids: Secondary | ICD-10-CM | POA: Diagnosis not present

## 2021-06-21 DIAGNOSIS — H2513 Age-related nuclear cataract, bilateral: Secondary | ICD-10-CM | POA: Diagnosis not present

## 2021-06-25 ENCOUNTER — Other Ambulatory Visit: Payer: Self-pay

## 2021-06-25 ENCOUNTER — Emergency Department (HOSPITAL_COMMUNITY)
Admission: EM | Admit: 2021-06-25 | Discharge: 2021-06-25 | Disposition: A | Discharge status: Home / Self Care | Payer: Medicare Other | Attending: Emergency Medicine | Admitting: Emergency Medicine

## 2021-06-25 ENCOUNTER — Encounter (HOSPITAL_COMMUNITY): Payer: Self-pay | Admitting: *Deleted

## 2021-06-25 ENCOUNTER — Emergency Department (HOSPITAL_COMMUNITY): Payer: Medicare Other

## 2021-06-25 DIAGNOSIS — J0110 Acute frontal sinusitis, unspecified: Secondary | ICD-10-CM | POA: Diagnosis not present

## 2021-06-25 DIAGNOSIS — K219 Gastro-esophageal reflux disease without esophagitis: Secondary | ICD-10-CM | POA: Insufficient documentation

## 2021-06-25 DIAGNOSIS — Z7901 Long term (current) use of anticoagulants: Secondary | ICD-10-CM | POA: Insufficient documentation

## 2021-06-25 DIAGNOSIS — Z79899 Other long term (current) drug therapy: Secondary | ICD-10-CM | POA: Insufficient documentation

## 2021-06-25 DIAGNOSIS — R001 Bradycardia, unspecified: Secondary | ICD-10-CM | POA: Diagnosis not present

## 2021-06-25 DIAGNOSIS — R519 Headache, unspecified: Secondary | ICD-10-CM | POA: Diagnosis present

## 2021-06-25 DIAGNOSIS — R0602 Shortness of breath: Secondary | ICD-10-CM | POA: Diagnosis not present

## 2021-06-25 DIAGNOSIS — R197 Diarrhea, unspecified: Secondary | ICD-10-CM | POA: Diagnosis not present

## 2021-06-25 DIAGNOSIS — J0111 Acute recurrent frontal sinusitis: Secondary | ICD-10-CM | POA: Insufficient documentation

## 2021-06-25 DIAGNOSIS — I10 Essential (primary) hypertension: Secondary | ICD-10-CM | POA: Diagnosis not present

## 2021-06-25 DIAGNOSIS — R109 Unspecified abdominal pain: Secondary | ICD-10-CM | POA: Insufficient documentation

## 2021-06-25 DIAGNOSIS — R059 Cough, unspecified: Secondary | ICD-10-CM | POA: Diagnosis not present

## 2021-06-25 LAB — CBC WITH DIFFERENTIAL/PLATELET
Abs Immature Granulocytes: 0.03 10*3/uL (ref 0.00–0.07)
Basophils Absolute: 0 10*3/uL (ref 0.0–0.1)
Basophils Relative: 0 %
Eosinophils Absolute: 0.2 10*3/uL (ref 0.0–0.5)
Eosinophils Relative: 3 %
HCT: 44.2 % (ref 39.0–52.0)
Hemoglobin: 15.1 g/dL (ref 13.0–17.0)
Immature Granulocytes: 0 %
Lymphocytes Relative: 23 %
Lymphs Abs: 2 10*3/uL (ref 0.7–4.0)
MCH: 30.6 pg (ref 26.0–34.0)
MCHC: 34.2 g/dL (ref 30.0–36.0)
MCV: 89.5 fL (ref 80.0–100.0)
Monocytes Absolute: 1 10*3/uL (ref 0.1–1.0)
Monocytes Relative: 11 %
Neutro Abs: 5.4 10*3/uL (ref 1.7–7.7)
Neutrophils Relative %: 63 %
Platelets: 254 10*3/uL (ref 150–400)
RBC: 4.94 MIL/uL (ref 4.22–5.81)
RDW: 12.8 % (ref 11.5–15.5)
WBC: 8.6 10*3/uL (ref 4.0–10.5)
nRBC: 0 % (ref 0.0–0.2)

## 2021-06-25 LAB — COMPREHENSIVE METABOLIC PANEL WITH GFR
ALT: 17 U/L (ref 0–44)
AST: 21 U/L (ref 15–41)
Albumin: 4.2 g/dL (ref 3.5–5.0)
Alkaline Phosphatase: 44 U/L (ref 38–126)
Anion gap: 8 (ref 5–15)
BUN: 13 mg/dL (ref 8–23)
CO2: 27 mmol/L (ref 22–32)
Calcium: 9.2 mg/dL (ref 8.9–10.3)
Chloride: 104 mmol/L (ref 98–111)
Creatinine, Ser: 0.92 mg/dL (ref 0.61–1.24)
GFR, Estimated: >60 mL/min
Glucose, Bld: 98 mg/dL (ref 70–99)
Potassium: 3.9 mmol/L (ref 3.5–5.1)
Sodium: 139 mmol/L (ref 135–145)
Total Bilirubin: 1 mg/dL (ref 0.3–1.2)
Total Protein: 7.3 g/dL (ref 6.5–8.1)

## 2021-06-25 MED ORDER — CEFUROXIME AXETIL 500 MG PO TABS: 500.0000 mg | ORAL_TABLET | Freq: Two times a day (BID) | ORAL | 0 refills | Status: AC

## 2021-06-25 NOTE — ED Notes (Signed)
Pt d/c home per MD order. Discharge summary reviewed with pt, pt verbalizes understanding. Ambulatory off unit. No s/s of acute distress noted. Discharged home with friend.  ?

## 2021-06-25 NOTE — ED Provider Notes (Signed)
?[facility] DEPT ?Provider Note ? ? ?CSN: [id number] ?Arrival date & time: 06/25/21  1049 ? ?  ? ?History ? ?Chief Complaint  ?Patient presents with  ? Facial Pain  ? ? ?Jonathan Powers is a 71 y.o. male.  With past medical history of GERD, seasonal allergies, hypertension, diverticulitis who presents emergency department with facial pain and diarrhea. ? ?Patient states that he has had sinus pain and pressure over the past 1 week.  He states that he was initially placed on doxycycline for 3 days however was changed to clindamycin on 06/23/2021 for sinusitis.  He states that since then he has had abdominal pain and worsening diarrhea.  He also states that his sinusitis symptoms are not improving and he feels more short of breath.  He states that he is coughing up green-yellow sputum.  He denies any chest pain or palpitations.  Denies fevers.  He denies nausea or vomiting.  He is still tolerating p.o. ? ?HPI ? ?  ? ?Home Medications ?Prior to Admission medications   ?Medication Sig Start Date End Date Taking? Authorizing Provider  ?acetaminophen (TYLENOL) 500 MG tablet Take 1,000 mg by mouth every 6 (six) hours as needed for moderate pain.     [provider]  ?buPROPion (WELLBUTRIN SR) 150 MG 12 hr tablet Take 1 tablet (150 mg total) by mouth 2 (two) times daily. 1 tablet in the morning, 1 tablet in the late afternoon 03/04/21   Adrian Prows, MD  ?cetirizine (ZYRTEC) 10 MG tablet Take 10 mg by mouth daily.    [provider]  ?esomeprazole (NEXIUM) 40 MG capsule Take 1 capsule (40 mg total) by mouth daily. 03/19/21   [name], MD  ?levothyroxine (SYNTHROID) 175 MCG tablet Take 1 tablet (175 mcg total) by mouth daily before breakfast. 03/28/21   [name], MD  ?memantine [place]) 5 MG tablet Take half tablet (5 mg) daily for 2 weeks, then increase to the full tablet at 5 mg bid 04/26/21   Rondel Jumbo, PA-C  ?olmesartan-hydrochlorothiazide (BENICAR HCT) 40-25 MG  tablet Take 1 tablet by mouth daily. As directed 03/05/21   Adrian Prows, MD  ?rosuvastatin (CRESTOR) 5 MG tablet TAKE ONE TABLET BY MOUTH DAILY AT BEDTIME 05/03/21   [name], MD  ?tadalafil (CIALIS) 5 MG tablet Take 5 mg by mouth at bedtime.    [provider]  ?Testosterone 1.62 % GEL Apply 3 Pump topically daily. 10/02/18   [provider]  ?traZODone (DESYREL) 100 MG tablet TAKE 1 TABLET BY MOUTH AT BEDTIME AS NEEDED FOR SLEEP. 05/28/21   [name], MD  ?valACYclovir (VALTREX) 1000 MG tablet Take 1 tablet (1,000 mg total) by mouth 2 (two) times daily. ?Patient taking differently: Take 1,000 mg by mouth as needed. 11/27/19   [name], MD  ?valACYclovir (VALTREX) 1000 MG tablet Take 1 tablet (1,000 mg total) by mouth 2 (two) times daily. 08/19/20   Garald Balding, MD  ?apixaban (ELIQUIS) 2.5 MG TABS tablet Take 1 tablet (2.5 mg total) by mouth 2 (two) times daily. 03/07/19 05/16/19  Leighton Parody, PA-C  ?   ? ?Allergies    ?Nsaids, Tolmetin, and Penicillins   ? ?Review of Systems   ?Review of Systems  ?Constitutional:  Negative for fever.  ?HENT:  Positive for postnasal drip, sinus pressure and sinus pain.   ?Respiratory:  Positive for cough and shortness of breath.   ?Cardiovascular:  Negative for chest  pain, palpitations and leg swelling.  ?Gastrointestinal:  Positive for abdominal pain and diarrhea. Negative for nausea and vomiting.  ?All other systems reviewed and are negative. ? ?Physical Exam ?Updated Vital Signs ?BP 119/61   Pulse (!) 58   Temp 98 ?F (36.7 ?C) (Oral)   Resp 16   Ht '5\' 11"'$  (1.803 m)   Wt 124.7 kg   SpO2 97%   BMI 38.35 kg/m?  ?Physical Exam ?Vitals and nursing note reviewed.  ?Constitutional:   ?   General: He is not in acute distress. ?   Appearance: Normal appearance. He is normal weight. He is ill-appearing. He is not toxic-appearing.  ?HENT:  ?   Head: Normocephalic and atraumatic.  ?   Right Ear: Hearing normal.  ?   Left Ear: Hearing normal. A  middle ear effusion is present.  ?   Nose: Septal deviation and congestion present.  ?   Right Sinus: Frontal sinus tenderness present. No maxillary sinus tenderness.  ?   Left Sinus: Frontal sinus tenderness present. No maxillary sinus tenderness.  ?   Mouth/Throat:  ?   Mouth: Mucous membranes are moist.  ?   Pharynx: Oropharynx is clear. Posterior oropharyngeal erythema present.  ?Eyes:  ?   General: No scleral icterus. ?   Extraocular Movements: Extraocular movements intact.  ?Cardiovascular:  ?   Rate and Rhythm: Normal rate and regular rhythm.  ?   Pulses: Normal pulses.  ?   Heart sounds: No murmur heard. ?Pulmonary:  ?   Effort: Pulmonary effort is normal. No respiratory distress.  ?   Breath sounds: Normal breath sounds.  ?Abdominal:  ?   General: Bowel sounds are normal. There is no distension.  ?   Palpations: Abdomen is soft.  ?   Tenderness: There is no abdominal tenderness.  ?Musculoskeletal:     ?   General: Normal range of motion.  ?Skin: ?   General: Skin is warm and dry.  ?   Capillary Refill: Capillary refill takes less than 2 seconds.  ?Neurological:  ?   General: No focal deficit present.  ?   Mental Status: He is alert and oriented to person, place, and time. Mental status is at baseline.  ?Psychiatric:     ?   Mood and Affect: Mood normal.     ?   Behavior: Behavior normal.     ?   Thought Content: Thought content normal.     ?   Judgment: Judgment normal.  ? ? ?ED Results / Procedures / Treatments   ?Labs ?(all labs ordered are listed, but only abnormal results are displayed) ?Labs Reviewed  ?GASTROINTESTINAL PANEL BY PCR, STOOL (REPLACES STOOL CULTURE)  ?C DIFFICILE QUICK SCREEN W PCR REFLEX    ?CBC WITH DIFFERENTIAL/PLATELET  ?COMPREHENSIVE METABOLIC PANEL  ? ?EKG ?EKG Interpretation ? ?Date/Time:  Saturday Jun 25 2021 12:22:35 EDT ?Ventricular Rate:  54 ?PR Interval:  195 ?QRS Duration: 94 ?QT Interval:  429 ?QTC Calculation: 407 ?R Axis:     ?Text Interpretation: Sinus bradycardia  Confirmed by Dene Gentry [phone]) on 06/25/2021 12:27:55 PM ? ?Radiology ?DG Chest 2 View ? ?Result Date: 06/25/2021 ?CLINICAL DATA:  Cough and shortness of breath for 2 days. EXAM: CHEST - 2 VIEW COMPARISON:  03/11/2021 and prior studies FINDINGS: This is a low volume study. The cardiomediastinal silhouette is unremarkable. Mild peribronchial thickening again noted. There is no evidence of focal airspace disease, pulmonary edema, suspicious pulmonary nodule/mass, pleural effusion, or pneumothorax. No acute  bony abnormalities are identified. IMPRESSION: No active cardiopulmonary disease. Electronically Signed   By: Margarette Canada M.D.   On: 06/25/2021 12:41   ? ?Procedures ?Procedures  ? ?Medications Ordered in ED ?Medications - No data to display ? ?ED Course/ Medical Decision Making/ A&P ?  ?                        ?Medical Decision Making ?Amount and/or Complexity of Data Reviewed ?Labs: ordered. ?Radiology: ordered. ? ?Risk ?Prescription drug management. ? ?This patient presents to the ED for concern of sinus pain and pressure, this involves an extensive number of treatment options, and is a complaint that carries with it a high risk of complications and morbidity.  The differential diagnosis includes bacterial or viral sinusitis  ? ?Co morbidities that complicate the patient evaluation ?GERD, allergies, HTN ? ?Additional history obtained:  ?Additional history obtained from: none  ?External records from outside source obtained and reviewed including: ENT recent prescription ? ?EKG: ?EKG: sinus bradycardia.  ? ?Lab Results: ?I personally ordered, reviewed, and interpreted labs. ?Pertinent results include: ?CBC and CMP within normal limits ? ?Imaging Studies ordered:  ?I ordered imaging studies which included x-ray.  I independently reviewed & interpreted imaging & am in agreement with radiology impression. ?Imaging shows: ?CXR: mild peribronchial thickening  ? ?Medications  ?-I reviewed the patient's home medications  and did make adjustments. ?-I did  prescribe new home medications. ? ?Tests Considered: ?GI panel, Cdiff for diarrhea after antibiotics - patient unable to provide sample ? ?Critical Interventions: ?N/a ? ?Consultati

## 2021-06-25 NOTE — Discharge Instructions (Signed)
You were seen in the emergency department for sinusitis. I am prescribing you Ceftin which you will take twice daily for 5 days. Please stop taking the Clindamycin as it is causing you GI upset. Please follow-up with your ENT and PCP if your symptoms are not improving.  ?

## 2021-06-25 NOTE — ED Triage Notes (Signed)
Pt has diarrhea and abd pain since started on antibiotics for sinus infection. Sinus infection has not improved with tx.  ?

## 2021-07-05 ENCOUNTER — Telehealth: Payer: Self-pay | Admitting: Internal Medicine

## 2021-07-05 NOTE — Telephone Encounter (Signed)
Patient thinks he may have an upper respiratory tract infection and Cdiff.  He just wanted to let you know. ?

## 2021-07-06 DIAGNOSIS — J310 Chronic rhinitis: Secondary | ICD-10-CM | POA: Diagnosis not present

## 2021-07-06 DIAGNOSIS — J343 Hypertrophy of nasal turbinates: Secondary | ICD-10-CM | POA: Diagnosis not present

## 2021-07-06 DIAGNOSIS — J324 Chronic pansinusitis: Secondary | ICD-10-CM | POA: Diagnosis not present

## 2021-07-06 DIAGNOSIS — J342 Deviated nasal septum: Secondary | ICD-10-CM | POA: Diagnosis not present

## 2021-07-07 ENCOUNTER — Encounter: Payer: Self-pay | Admitting: Internal Medicine

## 2021-07-07 ENCOUNTER — Ambulatory Visit (INDEPENDENT_AMBULATORY_CARE_PROVIDER_SITE_OTHER): Payer: Medicare Other | Admitting: Internal Medicine

## 2021-07-07 VITALS — BP 126/70 | HR 66 | Resp 18 | Ht 71.0 in | Wt 266.2 lb

## 2021-07-07 DIAGNOSIS — A09 Infectious gastroenteritis and colitis, unspecified: Secondary | ICD-10-CM | POA: Insufficient documentation

## 2021-07-07 DIAGNOSIS — I10 Essential (primary) hypertension: Secondary | ICD-10-CM

## 2021-07-07 DIAGNOSIS — J329 Chronic sinusitis, unspecified: Secondary | ICD-10-CM | POA: Diagnosis not present

## 2021-07-07 LAB — BASIC METABOLIC PANEL
BUN: 17 mg/dL (ref 6–23)
CO2: 28 mEq/L (ref 19–32)
Calcium: 9.3 mg/dL (ref 8.4–10.5)
Chloride: 104 mEq/L (ref 96–112)
Creatinine, Ser: 0.94 mg/dL (ref 0.40–1.50)
GFR: 82.59 mL/min (ref >60.00)
Glucose, Bld: 121 mg/dL — ABNORMAL HIGH (ref 70–99)
Potassium: 3.5 mEq/L (ref 3.5–5.1)
Sodium: 140 mEq/L (ref 135–145)

## 2021-07-07 LAB — HEPATIC FUNCTION PANEL
ALT: 24 U/L (ref 0–53)
AST: 28 U/L (ref 0–37)
Albumin: 4.3 g/dL (ref 3.5–5.2)
Alkaline Phosphatase: 46 U/L (ref 39–117)
Bilirubin, Direct: 0.1 mg/dL (ref 0.0–0.3)
Total Bilirubin: 0.5 mg/dL (ref 0.2–1.2)
Total Protein: 7.1 g/dL (ref 6.0–8.3)

## 2021-07-07 LAB — CBC WITH DIFFERENTIAL/PLATELET
Basophils Absolute: 0 10*3/uL (ref 0.0–0.1)
Basophils Relative: 0.4 % (ref 0.0–3.0)
Eosinophils Absolute: 0.1 10*3/uL (ref 0.0–0.7)
Eosinophils Relative: 1.5 % (ref 0.0–5.0)
HCT: 41 % (ref 39.0–52.0)
Hemoglobin: 14.3 g/dL (ref 13.0–17.0)
Lymphocytes Relative: 26.5 % (ref 12.0–46.0)
Lymphs Abs: 1.9 10*3/uL (ref 0.7–4.0)
MCHC: 35 g/dL (ref 30.0–36.0)
MCV: 87.8 fl (ref 78.0–100.0)
Monocytes Absolute: 0.7 10*3/uL (ref 0.1–1.0)
Monocytes Relative: 9.6 % (ref 3.0–12.0)
Neutro Abs: 4.6 10*3/uL (ref 1.4–7.7)
Neutrophils Relative %: 62 % (ref 43.0–77.0)
Platelets: 353 10*3/uL (ref 150.0–400.0)
RBC: 4.66 Mil/uL (ref 4.22–5.81)
RDW: 13.3 % (ref 11.5–15.5)
WBC: 7.4 10*3/uL (ref 4.0–10.5)

## 2021-07-07 NOTE — Patient Instructions (Signed)
Please continue all other medications as before, and refills have been done if requested.  Please have the pharmacy call with any other refills you may need.  Please keep your appointments with your specialists as you may have planned  Please go to the LAB at the blood drawing area for the tests to be done  You will be contacted by phone if any changes need to be made immediately.  Otherwise, you will receive a letter about your results with an explanation, but please check with MyChart first.  Please remember to sign up for MyChart if you have not done so, as this will be important to you in the future with finding out test results, communicating by private email, and scheduling acute appointments online when needed.      

## 2021-07-07 NOTE — Progress Notes (Signed)
Patient ID: Jonathan Powers, male   DOB: 1950-05-21, 71 y.o.   MRN: [id number]        Chief Complaint:  Chief Complaint  Patient presents with   Possible C-Diff    He mentioned that he has been on different antibiotics over the last 3-4 weeks. And now his bowel movements come out like pudding. He stated that he has had this before.         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  Denies worsening reflux, dysphagia, vomiting.  Does c/o ongoing fatigue, but denies signficant daytime hypersomnolence.  Pt denies chest pain, increased sob or doe, wheezing, orthopnea, PND, increased LE swelling, palpitations, dizziness or syncope.   Pt denies polydipsia, polyuria, or new focal neuro s/s.        Wt Readings from Last 3 Encounters:  07/07/21 266 lb 3.2 oz (120.7 kg)  04/26/21 268 lb (121.6 kg)  03/18/21 269 lb (122 kg)   BP Readings from Last 3 Encounters:  07/07/21 126/70  04/26/21 114/68  03/18/21 116/72         Past Medical History:  Diagnosis Date   ALLERGIC RHINITIS    Cancer ([place])    Melonoma back   CELLULITIS, LEG, RIGHT    Recurrent R hip cellulitis 1/08,3/08    Diverticulitis    GERD (gastroesophageal reflux disease)    Gout    Hashimoto's thyroiditis    Hypertension    Hypogonadism male    low T, a/w ED   Hypothyroid    goiters    Left thyroid nodule 2008   on [place], decrease size in 2011 [place]   Migraines    Atypical and ocular   OSTEOARTHRITIS    Seasonal allergies    Sleep apnea    cpap   Past Surgical History:  Procedure Laterality Date   CARPAL TUNNEL RELEASE Left 03/30/2017   Procedure: CARPAL TUNNEL RELEASE;  Surgeon: Garald Balding, MD;  Location: [place];  Service: Orthopedics;  Laterality: Left;   CHOLECYSTECTOMY  2004    CTR Bilateral [place]   FOOT FUSION Left 06/22/2017   HEMORRHOID SURGERY N/A 03/30/2017   Procedure: HEMORRHOIDECTOMY;  Surgeon: Coralie Keens, MD;  Location: [place];  Service: General;  Laterality: N/A;   open reduction L little finger Left 1970   Repair digital thumb, left Left 1990   Right shoulder cuff repair Right 09/15/11   Right shoulder SAD, DCR Right 02/05/11   TOTAL HIP ARTHROPLASTY Left 07/26/06   TOTAL HIP ARTHROPLASTY Right 1999   TOTAL HIP REVISION Left 03/07/2019   Procedure: LEFT TOTAL HIP REVISION POSTERIOR APPROACH;  Surgeon: Frederik Pear, MD;  Location: [facility];  Service: Orthopedics;  Laterality: Left;    reports that he has never smoked. He has never used smokeless tobacco. He reports current alcohol use. He reports that he does not use drugs. family history includes Arthritis in his father and mother; Cancer in his paternal grandfather and paternal grandmother; Diabetes in his father and mother; Heart disease in his father; Multiple sclerosis in his mother; Thyroid cancer in his sister; Uterine cancer in his sister; Vascular Disease in his father. Allergies  Allergen Reactions   Nsaids Shortness Of Breath  Naproxen specifically causes SOB/wheeze tolerates topical diclofenac without problem Tylenol OK   Tolmetin Shortness Of Breath    Naproxen specifically causes SOB/wheeze tolerates topical diclofenac without problem Tylenol OK   Penicillins Rash    Did it involve swelling of the face/tongue/throat, SOB, or low BP? No Did it involve sudden or severe rash/hives, skin peeling, or any reaction on the inside of your mouth or nose? No Did you need to seek medical attention at a hospital or doctor's office? No When did it last happen?       If all above answers are "NO", may proceed with cephalosporin use.    Current Outpatient Medications on File Prior to Visit  Medication Sig Dispense Refill   acetaminophen (TYLENOL) 500 MG  tablet Take 1,000 mg by mouth every 6 (six) hours as needed for moderate pain.      buPROPion (WELLBUTRIN SR) 150 MG 12 hr tablet Take 1 tablet (150 mg total) by mouth 2 (two) times daily. 1 tablet in the morning, 1 tablet in the late afternoon 60 tablet 3   cetirizine (ZYRTEC) 10 MG tablet Take 10 mg by mouth daily.     esomeprazole (NEXIUM) 40 MG capsule Take 1 capsule (40 mg total) by mouth daily. 90 capsule 1   levothyroxine (SYNTHROID) 175 MCG tablet Take 1 tablet (175 mcg total) by mouth daily before breakfast. 90 tablet 0   memantine (NAMENDA) 5 MG tablet Take half tablet (5 mg) daily for 2 weeks, then increase to the full tablet at 5 mg bid 60 tablet 11   olmesartan-hydrochlorothiazide (BENICAR HCT) 40-25 MG tablet Take 1 tablet by mouth daily. As directed 90 tablet 3   rosuvastatin (CRESTOR) 5 MG tablet TAKE ONE TABLET BY MOUTH DAILY AT BEDTIME 90 tablet 1   tadalafil (CIALIS) 5 MG tablet Take 5 mg by mouth at bedtime.     Testosterone 1.62 % GEL Apply 3 Pump topically daily.     traZODone (DESYREL) 100 MG tablet TAKE 1 TABLET BY MOUTH AT BEDTIME AS NEEDED FOR SLEEP. 90 tablet 1   valACYclovir (VALTREX) 1000 MG tablet Take 1 tablet (1,000 mg total) by mouth 2 (two) times daily. (Patient taking differently: Take 1,000 mg by mouth as needed.) 14 tablet 5   valACYclovir (VALTREX) 1000 MG tablet Take 1 tablet (1,000 mg total) by mouth 2 (two) times daily. 30 tablet 0   [DISCONTINUED] apixaban (ELIQUIS) 2.5 MG TABS tablet Take 1 tablet (2.5 mg total) by mouth 2 (two) times daily. 60 tablet 0   No current facility-administered medications on file prior to visit.        ROS:  All others reviewed and negative.  Objective        PE:  BP 126/70   Pulse 66   Resp 18   Ht '5\' 11"'$  (1.803 m)   Wt 266 lb 3.2 oz (120.7 kg)   SpO2 96%   BMI 37.13 kg/m                 Constitutional: Pt appears in NAD               HENT: Head: NCAT.                Right Ear: External ear normal.                  Left Ear: External ear normal.                Eyes: .  Pupils are equal, round, and reactive to light. Conjunctivae and EOM are normal               Nose: without d/c or deformity               Neck: Neck supple. Gross normal ROM               Cardiovascular: Normal rate and regular rhythm.                 Pulmonary/Chest: Effort normal and breath sounds without rales or wheezing.                Abd:  Soft, NT, ND, + BS, no organomegaly               Neurological: Pt is alert. At baseline orientation, motor grossly intact               Skin: Skin is warm. No rashes, no other new lesions, LE edema - none               Psychiatric: Pt behavior is normal without agitation   Micro: none  Cardiac tracings I have personally interpreted today:  none  Pertinent Radiological findings (summarize): none   Lab Results  Component Value Date   WBC 7.4 07/07/2021   HGB 14.3 07/07/2021   HCT 41.0 07/07/2021   PLT 353.0 07/07/2021   GLUCOSE 121 (H) 07/07/2021   CHOL 133 02/09/2021   TRIG 145.0 02/09/2021   HDL 42.50 02/09/2021   LDLCALC 62 02/09/2021   ALT 24 07/07/2021   AST 28 07/07/2021   NA 140 07/07/2021   K 3.5 07/07/2021   CL 104 07/07/2021   CREATININE 0.94 07/07/2021   BUN 17 07/07/2021   CO2 28 07/07/2021   TSH 1.24 02/09/2021   PSA 0.880 08/30/2020   INR 1.0 03/05/2019   HGBA1C 5.8 02/09/2021   Assessment/Plan:  Jonathan Powers is a 71 y.o. White or Caucasian [1] male with  has a past medical history of ALLERGIC RHINITIS, Cancer ([place]), CELLULITIS, LEG, RIGHT, Diverticulitis, GERD (gastroesophageal reflux disease), Gout, Hashimoto's thyroiditis, Hypertension, Hypogonadism male, Hypothyroid, Left thyroid nodule (2008), Migraines, OSTEOARTHRITIS, Seasonal allergies, and Sleep apnea.  Diarrhea of infectious origin Presumed infectious, for r/o C diff stool study,  to f/u any worsening symptoms or concerns  Essential hypertension BP Readings from Last 3 Encounters:  07/07/21  126/70  04/26/21 114/68  03/18/21 116/72   Stable, pt to continue medical treatment benicar hct   Chronic sinusitis Pt to f/u ENT as planned  Followup: Return if symptoms worsen or fail to improve.  Cathlean Cower, MD 07/10/2021 1:59 PM [facility]

## 2021-07-08 ENCOUNTER — Telehealth: Payer: Self-pay | Admitting: Internal Medicine

## 2021-07-08 NOTE — Telephone Encounter (Signed)
Sorry, no results yet, and this often takes several days

## 2021-07-08 NOTE — Telephone Encounter (Signed)
Patient would like someone to call him with his fecal results from yesterday

## 2021-07-09 LAB — CLOSTRIDIUM DIFFICILE BY PCR: Toxigenic C. Difficile by PCR: NEGATIVE

## 2021-07-10 ENCOUNTER — Encounter: Payer: Self-pay | Admitting: Internal Medicine

## 2021-07-10 DIAGNOSIS — J329 Chronic sinusitis, unspecified: Secondary | ICD-10-CM

## 2021-07-10 HISTORY — DX: Chronic sinusitis, unspecified: J32.9

## 2021-07-10 NOTE — Assessment & Plan Note (Signed)
Pt to f/u ENT as planned

## 2021-07-10 NOTE — Assessment & Plan Note (Signed)
Presumed infectious, for r/o C diff stool study,  to f/u any worsening symptoms or concerns

## 2021-07-10 NOTE — Assessment & Plan Note (Signed)
BP Readings from Last 3 Encounters:  07/07/21 126/70  04/26/21 114/68  03/18/21 116/72   Stable, pt to continue medical treatment benicar hct

## 2021-07-11 ENCOUNTER — Telehealth: Payer: Self-pay | Admitting: Internal Medicine

## 2021-07-11 NOTE — Telephone Encounter (Signed)
Connected to [facility] 5.20.2023.  Caller was in the office and was waiting for his test results for his CDif, caller states he still has Diarrhea.  Advised to go to ED.

## 2021-07-12 NOTE — Telephone Encounter (Signed)
Pt saw results.. MD sent msg on mychart C-Diff was Negative.Marland KitchenJohny Powers

## 2021-07-25 ENCOUNTER — Other Ambulatory Visit: Payer: Self-pay | Admitting: Internal Medicine

## 2021-07-25 DIAGNOSIS — R918 Other nonspecific abnormal finding of lung field: Secondary | ICD-10-CM

## 2021-07-25 DIAGNOSIS — R911 Solitary pulmonary nodule: Secondary | ICD-10-CM

## 2021-07-25 HISTORY — DX: Solitary pulmonary nodule: R91.1

## 2021-08-03 ENCOUNTER — Ambulatory Visit
Admission: RE | Admit: 2021-08-03 | Discharge: 2021-08-03 | Disposition: A | Discharge status: Home / Self Care | Payer: Medicare Other | Source: Ambulatory Visit | Attending: Internal Medicine | Admitting: Internal Medicine

## 2021-08-03 DIAGNOSIS — R918 Other nonspecific abnormal finding of lung field: Secondary | ICD-10-CM | POA: Diagnosis not present

## 2021-08-03 DIAGNOSIS — R911 Solitary pulmonary nodule: Secondary | ICD-10-CM | POA: Diagnosis not present

## 2021-08-16 DIAGNOSIS — L821 Other seborrheic keratosis: Secondary | ICD-10-CM | POA: Diagnosis not present

## 2021-08-16 DIAGNOSIS — B360 Pityriasis versicolor: Secondary | ICD-10-CM | POA: Diagnosis not present

## 2021-08-16 DIAGNOSIS — L720 Epidermal cyst: Secondary | ICD-10-CM | POA: Diagnosis not present

## 2021-09-05 ENCOUNTER — Inpatient Hospital Stay (HOSPITAL_COMMUNITY)
Admission: EM | Admit: 2021-09-05 | Discharge: 2021-09-13 | DRG: 603 | Disposition: A | Discharge status: Home / Self Care | Payer: Medicare Other | Attending: Internal Medicine | Admitting: Internal Medicine

## 2021-09-05 ENCOUNTER — Encounter (HOSPITAL_COMMUNITY): Payer: Self-pay

## 2021-09-05 ENCOUNTER — Other Ambulatory Visit: Payer: Self-pay

## 2021-09-05 ENCOUNTER — Emergency Department (HOSPITAL_BASED_OUTPATIENT_CLINIC_OR_DEPARTMENT_OTHER): Payer: Medicare Other

## 2021-09-05 DIAGNOSIS — E783 Hyperchylomicronemia: Secondary | ICD-10-CM | POA: Diagnosis not present

## 2021-09-05 DIAGNOSIS — Z8261 Family history of arthritis: Secondary | ICD-10-CM | POA: Diagnosis not present

## 2021-09-05 DIAGNOSIS — L03116 Cellulitis of left lower limb: Secondary | ICD-10-CM | POA: Diagnosis not present

## 2021-09-05 DIAGNOSIS — Z886 Allergy status to analgesic agent status: Secondary | ICD-10-CM | POA: Diagnosis not present

## 2021-09-05 DIAGNOSIS — Z7901 Long term (current) use of anticoagulants: Secondary | ICD-10-CM

## 2021-09-05 DIAGNOSIS — R059 Cough, unspecified: Secondary | ICD-10-CM | POA: Diagnosis not present

## 2021-09-05 DIAGNOSIS — J302 Other seasonal allergic rhinitis: Secondary | ICD-10-CM | POA: Diagnosis present

## 2021-09-05 DIAGNOSIS — E063 Autoimmune thyroiditis: Secondary | ICD-10-CM | POA: Diagnosis present

## 2021-09-05 DIAGNOSIS — Z8249 Family history of ischemic heart disease and other diseases of the circulatory system: Secondary | ICD-10-CM

## 2021-09-05 DIAGNOSIS — E876 Hypokalemia: Secondary | ICD-10-CM | POA: Diagnosis present

## 2021-09-05 DIAGNOSIS — L0390 Cellulitis, unspecified: Secondary | ICD-10-CM | POA: Diagnosis present

## 2021-09-05 DIAGNOSIS — E039 Hypothyroidism, unspecified: Secondary | ICD-10-CM

## 2021-09-05 DIAGNOSIS — E785 Hyperlipidemia, unspecified: Secondary | ICD-10-CM

## 2021-09-05 DIAGNOSIS — Z79899 Other long term (current) drug therapy: Secondary | ICD-10-CM

## 2021-09-05 DIAGNOSIS — M109 Gout, unspecified: Secondary | ICD-10-CM | POA: Diagnosis present

## 2021-09-05 DIAGNOSIS — Z820 Family history of epilepsy and other diseases of the nervous system: Secondary | ICD-10-CM | POA: Diagnosis not present

## 2021-09-05 DIAGNOSIS — E032 Hypothyroidism due to medicaments and other exogenous substances: Secondary | ICD-10-CM | POA: Diagnosis not present

## 2021-09-05 DIAGNOSIS — I10 Essential (primary) hypertension: Secondary | ICD-10-CM | POA: Diagnosis present

## 2021-09-05 DIAGNOSIS — Z66 Do not resuscitate: Secondary | ICD-10-CM | POA: Diagnosis present

## 2021-09-05 DIAGNOSIS — Z880 Allergy status to penicillin: Secondary | ICD-10-CM

## 2021-09-05 DIAGNOSIS — E669 Obesity, unspecified: Secondary | ICD-10-CM

## 2021-09-05 DIAGNOSIS — Z20822 Contact with and (suspected) exposure to covid-19: Secondary | ICD-10-CM | POA: Diagnosis present

## 2021-09-05 DIAGNOSIS — L03115 Cellulitis of right lower limb: Secondary | ICD-10-CM | POA: Diagnosis not present

## 2021-09-05 DIAGNOSIS — Z833 Family history of diabetes mellitus: Secondary | ICD-10-CM

## 2021-09-05 DIAGNOSIS — M7989 Other specified soft tissue disorders: Secondary | ICD-10-CM

## 2021-09-05 DIAGNOSIS — Z85828 Personal history of other malignant neoplasm of skin: Secondary | ICD-10-CM

## 2021-09-05 DIAGNOSIS — Z79890 Hormone replacement therapy (postmenopausal): Secondary | ICD-10-CM

## 2021-09-05 DIAGNOSIS — Z96643 Presence of artificial hip joint, bilateral: Secondary | ICD-10-CM | POA: Diagnosis present

## 2021-09-05 DIAGNOSIS — E031 Congenital hypothyroidism without goiter: Secondary | ICD-10-CM | POA: Diagnosis not present

## 2021-09-05 DIAGNOSIS — Z808 Family history of malignant neoplasm of other organs or systems: Secondary | ICD-10-CM

## 2021-09-05 DIAGNOSIS — K219 Gastro-esophageal reflux disease without esophagitis: Secondary | ICD-10-CM | POA: Diagnosis present

## 2021-09-05 DIAGNOSIS — L03114 Cellulitis of left upper limb: Secondary | ICD-10-CM | POA: Diagnosis not present

## 2021-09-05 DIAGNOSIS — E7801 Familial hypercholesterolemia: Secondary | ICD-10-CM | POA: Diagnosis not present

## 2021-09-05 DIAGNOSIS — G4733 Obstructive sleep apnea (adult) (pediatric): Secondary | ICD-10-CM | POA: Diagnosis present

## 2021-09-05 DIAGNOSIS — E871 Hypo-osmolality and hyponatremia: Secondary | ICD-10-CM | POA: Diagnosis not present

## 2021-09-05 DIAGNOSIS — Z888 Allergy status to other drugs, medicaments and biological substances status: Secondary | ICD-10-CM

## 2021-09-05 DIAGNOSIS — Z6837 Body mass index (BMI) 37.0-37.9, adult: Secondary | ICD-10-CM

## 2021-09-05 DIAGNOSIS — Z9049 Acquired absence of other specified parts of digestive tract: Secondary | ICD-10-CM

## 2021-09-05 DIAGNOSIS — R0602 Shortness of breath: Secondary | ICD-10-CM | POA: Diagnosis not present

## 2021-09-05 LAB — CBC WITH DIFFERENTIAL/PLATELET
Abs Immature Granulocytes: 0.04 10*3/uL (ref 0.00–0.07)
Basophils Absolute: 0 10*3/uL (ref 0.0–0.1)
Basophils Relative: 0 %
Eosinophils Absolute: 0.1 10*3/uL (ref 0.0–0.5)
Eosinophils Relative: 1 %
HCT: 43.6 % (ref 39.0–52.0)
Hemoglobin: 14.6 g/dL (ref 13.0–17.0)
Immature Granulocytes: 0 %
Lymphocytes Relative: 12 %
Lymphs Abs: 1.3 10*3/uL (ref 0.7–4.0)
MCH: 30.8 pg (ref 26.0–34.0)
MCHC: 33.5 g/dL (ref 30.0–36.0)
MCV: 92 fL (ref 80.0–100.0)
Monocytes Absolute: 0.9 10*3/uL (ref 0.1–1.0)
Monocytes Relative: 9 %
Neutro Abs: 8.5 10*3/uL — ABNORMAL HIGH (ref 1.7–7.7)
Neutrophils Relative %: 78 %
Platelets: 262 10*3/uL (ref 150–400)
RBC: 4.74 MIL/uL (ref 4.22–5.81)
RDW: 12.9 % (ref 11.5–15.5)
WBC: 10.9 10*3/uL — ABNORMAL HIGH (ref 4.0–10.5)
nRBC: 0 % (ref 0.0–0.2)

## 2021-09-05 LAB — CBC
HCT: 40 % (ref 39.0–52.0)
Hemoglobin: 13.5 g/dL (ref 13.0–17.0)
MCH: 30.9 pg (ref 26.0–34.0)
MCHC: 33.8 g/dL (ref 30.0–36.0)
MCV: 91.5 fL (ref 80.0–100.0)
Platelets: 217 10*3/uL (ref 150–400)
RBC: 4.37 MIL/uL (ref 4.22–5.81)
RDW: 12.8 % (ref 11.5–15.5)
WBC: 9.3 10*3/uL (ref 4.0–10.5)
nRBC: 0 % (ref 0.0–0.2)

## 2021-09-05 LAB — COMPREHENSIVE METABOLIC PANEL
ALT: 25 U/L (ref 0–44)
AST: 23 U/L (ref 15–41)
Albumin: 3.8 g/dL (ref 3.5–5.0)
Alkaline Phosphatase: 47 U/L (ref 38–126)
Anion gap: 9 (ref 5–15)
BUN: 17 mg/dL (ref 8–23)
CO2: 29 mmol/L (ref 22–32)
Calcium: 9.1 mg/dL (ref 8.9–10.3)
Chloride: 96 mmol/L — ABNORMAL LOW (ref 98–111)
Creatinine, Ser: 1.22 mg/dL (ref 0.61–1.24)
GFR, Estimated: >60 mL/min (ref >60)
Glucose, Bld: 124 mg/dL — ABNORMAL HIGH (ref 70–99)
Potassium: 3.5 mmol/L (ref 3.5–5.1)
Sodium: 134 mmol/L — ABNORMAL LOW (ref 135–145)
Total Bilirubin: 0.9 mg/dL (ref 0.3–1.2)
Total Protein: 7.5 g/dL (ref 6.5–8.1)

## 2021-09-05 LAB — CREATININE, SERUM
Creatinine, Ser: 0.88 mg/dL (ref 0.61–1.24)
GFR, Estimated: >60 mL/min (ref >60)

## 2021-09-05 MED ORDER — ENOXAPARIN SODIUM 60 MG/0.6ML IJ SOSY
60.0000 mg | PREFILLED_SYRINGE | INTRAMUSCULAR | Status: DC
  Administered 2021-09-05 – 2021-09-12 (×8): 60 mg via SUBCUTANEOUS
  Filled 2021-09-05 (×8): qty 0.6

## 2021-09-05 MED ORDER — SODIUM CHLORIDE 0.9 % IV SOLN
2.0000 g | Freq: Once | INTRAVENOUS | Status: AC
  Administered 2021-09-05: 2 g via INTRAVENOUS
  Filled 2021-09-05: qty 20

## 2021-09-05 MED ORDER — LEVOTHYROXINE SODIUM 100 MCG PO TABS
175.0000 ug | ORAL_TABLET | Freq: Every day | ORAL | Status: DC
  Administered 2021-09-06 – 2021-09-13 (×8): 175 ug via ORAL
  Filled 2021-09-05 (×8): qty 1

## 2021-09-05 MED ORDER — VANCOMYCIN HCL 1500 MG/300ML IV SOLN: 1500.0000 mg | INTRAVENOUS | Status: DC

## 2021-09-05 MED ORDER — LIDOCAINE HCL 1 % IJ SOLN
INTRAMUSCULAR | Status: AC
  Filled 2021-09-05: qty 20

## 2021-09-05 MED ORDER — MORPHINE SULFATE (PF) 2 MG/ML IV SOLN
2.0000 mg | INTRAVENOUS | Status: DC | PRN
  Administered 2021-09-06: 2 mg via INTRAVENOUS
  Filled 2021-09-05: qty 1

## 2021-09-05 MED ORDER — IRBESARTAN 150 MG PO TABS
300.0000 mg | ORAL_TABLET | Freq: Every day | ORAL | Status: DC
  Filled 2021-09-05: qty 2

## 2021-09-05 MED ORDER — LORATADINE 10 MG PO TABS
10.0000 mg | ORAL_TABLET | Freq: Every day | ORAL | Status: DC
  Administered 2021-09-06 – 2021-09-13 (×8): 10 mg via ORAL
  Filled 2021-09-05 (×8): qty 1

## 2021-09-05 MED ORDER — PANTOPRAZOLE SODIUM 40 MG PO TBEC
40.0000 mg | DELAYED_RELEASE_TABLET | Freq: Every day | ORAL | Status: DC
  Administered 2021-09-06 – 2021-09-13 (×8): 40 mg via ORAL
  Filled 2021-09-05 (×8): qty 1

## 2021-09-05 MED ORDER — ROSUVASTATIN CALCIUM 5 MG PO TABS
5.0000 mg | ORAL_TABLET | Freq: Every day | ORAL | Status: DC
  Administered 2021-09-06 – 2021-09-12 (×7): 5 mg via ORAL
  Filled 2021-09-05 (×7): qty 1

## 2021-09-05 MED ORDER — OLMESARTAN MEDOXOMIL-HCTZ 40-25 MG PO TABS: 1.0000 | ORAL_TABLET | Freq: Every day | ORAL | Status: DC

## 2021-09-05 MED ORDER — ACETAMINOPHEN 500 MG PO TABS
1000.0000 mg | ORAL_TABLET | Freq: Four times a day (QID) | ORAL | Status: DC | PRN
  Administered 2021-09-06 – 2021-09-11 (×6): 1000 mg via ORAL
  Filled 2021-09-05 (×7): qty 2

## 2021-09-05 MED ORDER — ONDANSETRON HCL 4 MG PO TABS: 4.0000 mg | ORAL_TABLET | Freq: Four times a day (QID) | ORAL | Status: DC | PRN

## 2021-09-05 MED ORDER — OXYCODONE HCL 5 MG PO TABS
5.0000 mg | ORAL_TABLET | ORAL | Status: DC | PRN
  Administered 2021-09-05 – 2021-09-12 (×23): 5 mg via ORAL
  Filled 2021-09-05 (×24): qty 1

## 2021-09-05 MED ORDER — FLUTICASONE PROPIONATE 50 MCG/ACT NA SUSP
1.0000 | Freq: Every day | NASAL | Status: DC | PRN
Start: 2021-09-05 — End: 2021-09-13

## 2021-09-05 MED ORDER — ACETAMINOPHEN 650 MG RE SUPP: 650.0000 mg | Freq: Four times a day (QID) | RECTAL | Status: DC | PRN

## 2021-09-05 MED ORDER — ONDANSETRON HCL 4 MG/2ML IJ SOLN: 4.0000 mg | Freq: Four times a day (QID) | INTRAMUSCULAR | Status: DC | PRN

## 2021-09-05 MED ORDER — VANCOMYCIN HCL 2000 MG/400ML IV SOLN
2000.0000 mg | Freq: Once | INTRAVENOUS | Status: AC
  Administered 2021-09-05: 2000 mg via INTRAVENOUS
  Filled 2021-09-05: qty 400

## 2021-09-05 MED ORDER — HYDROCHLOROTHIAZIDE 25 MG PO TABS
25.0000 mg | ORAL_TABLET | Freq: Every day | ORAL | Status: DC
  Filled 2021-09-05: qty 1

## 2021-09-05 MED ORDER — SENNOSIDES-DOCUSATE SODIUM 8.6-50 MG PO TABS: 1.0000 | ORAL_TABLET | Freq: Every evening | ORAL | Status: DC | PRN

## 2021-09-05 MED ORDER — CEFTRIAXONE SODIUM 1 G IJ SOLR
2.0000 g | Freq: Once | INTRAMUSCULAR | Status: DC
  Filled 2021-09-05: qty 20

## 2021-09-05 MED ORDER — ACETAMINOPHEN 325 MG PO TABS
650.0000 mg | ORAL_TABLET | Freq: Once | ORAL | Status: AC
Start: 2021-09-05 — End: 2021-09-05
  Administered 2021-09-05: 650 mg via ORAL
  Filled 2021-09-05: qty 2

## 2021-09-05 MED ORDER — TRAZODONE HCL 100 MG PO TABS
100.0000 mg | ORAL_TABLET | Freq: Every evening | ORAL | Status: DC | PRN
Start: 2021-09-05 — End: 2021-09-13

## 2021-09-05 MED ORDER — SODIUM CHLORIDE 0.9 % IV SOLN: INTRAVENOUS | Status: DC

## 2021-09-05 MED ORDER — SODIUM CHLORIDE 0.9 % IV SOLN
2.0000 g | INTRAVENOUS | Status: DC
  Administered 2021-09-06 – 2021-09-08 (×3): 2 g via INTRAVENOUS
  Filled 2021-09-05 (×3): qty 20

## 2021-09-05 NOTE — H&P (Signed)
History and Physical    Jonathan Powers HYW:[id number] DOB: 1950-06-03 DOA: 09/05/2021  PCP: [name], MD   Patient coming from: Home  I have personally briefly reviewed patient's old medical records in [place]  Chief Complaint:   HPI: Jonathan Powers is a 71 y.o. male with medical history significant of hypertension, hypothyroidism, obstructive sleep apnea on CPAP presented with progressively worsening left lower extremity swelling.  Patient states that he has had worsening left leg swelling for the last 2 days, gradual onset, progressively worsening, involving the most of the lower leg associated with pain, fever.  Patient denies any leg trauma, animal or insect bite, nausea, vomiting, chest pain, shortness breath, palpitations, loss of consciousness or seizures.  ED Course: He was found to have left lower extremity cellulitis and started on broad-spectrum antibiotics.  Lower extremity duplex was negative for DVT. Hospitalist service was called to evaluate the patient.  Review of Systems: As per HPI otherwise all other systems were reviewed and are negative.   Past Medical History:  Diagnosis Date   ALLERGIC RHINITIS    Cancer ([place])    Melonoma back   CELLULITIS, LEG, RIGHT    Recurrent R hip cellulitis 1/08,3/08    Diverticulitis    GERD (gastroesophageal reflux disease)    Gout    Hashimoto's thyroiditis    Hypertension    Hypogonadism male    low T, a/w ED   Hypothyroid    goiters    Left thyroid nodule 2008   on [place], decrease size in 2011 [place]   Migraines    Atypical and ocular   OSTEOARTHRITIS    Seasonal allergies    Sleep apnea    cpap    Past Surgical History:  Procedure Laterality Date   CARPAL TUNNEL RELEASE Left 03/30/2017   Procedure: CARPAL TUNNEL RELEASE;  Surgeon: Garald Balding, MD;  Location: [place];  Service: Orthopedics;  Laterality: Left;   CHOLECYSTECTOMY  2004   CTR Bilateral [place]    FOOT FUSION Left 06/22/2017   HEMORRHOID SURGERY N/A 03/30/2017   Procedure: HEMORRHOIDECTOMY;  Surgeon: Coralie Keens, MD;  Location: [place];  Service: General;  Laterality: N/A;   open reduction L little finger Left 1970   Repair digital thumb, left Left 1990   Right shoulder cuff repair Right 09/15/11   Right shoulder SAD, DCR Right 02/05/11   TOTAL HIP ARTHROPLASTY Left 07/26/06   TOTAL HIP ARTHROPLASTY Right 1999   TOTAL HIP REVISION Left 03/07/2019   Procedure: LEFT TOTAL HIP REVISION POSTERIOR APPROACH;  Surgeon: Frederik Pear, MD;  Location: [facility];  Service: Orthopedics;  Laterality: Left;     reports that he has never smoked. He has never used smokeless tobacco. He reports current alcohol use. He reports that he does not use drugs.  Allergies  Allergen Reactions   Nsaids Shortness Of Breath    Naproxen specifically causes SOB/wheeze tolerates topical diclofenac without problem Tylenol OK   Tolmetin Shortness Of Breath    Naproxen specifically causes SOB/wheeze tolerates topical diclofenac without problem Tylenol OK   Penicillins Rash    Did it involve swelling of the face/tongue/throat, SOB, or low BP? No Did it involve sudden or severe rash/hives, skin peeling, or any reaction on the inside of your mouth or nose? No Did you need to seek medical attention at a hospital or doctor's office? No When did it last happen?  If all above answers are "NO", may proceed with cephalosporin use.     Family History  Problem Relation Age of Onset   Arthritis Father    Heart disease Father    Diabetes Father    Vascular Disease Father        AoBifem   Arthritis Mother    Diabetes Mother    Multiple sclerosis Mother    Cancer Paternal Grandmother        "GI"   Cancer Paternal Grandfather        stomach cancer   Thyroid cancer Sister    Uterine cancer Sister     Prior to Admission medications   Medication Sig Start Date End Date Taking? Authorizing  Provider  acetaminophen (TYLENOL) 500 MG tablet Take 1,000 mg by mouth every 6 (six) hours as needed for moderate pain.     [provider]  buPROPion (WELLBUTRIN SR) 150 MG 12 hr tablet Take 1 tablet (150 mg total) by mouth 2 (two) times daily. 1 tablet in the morning, 1 tablet in the late afternoon 03/04/21   Adrian Prows, MD  cetirizine (ZYRTEC) 10 MG tablet Take 10 mg by mouth daily.    [provider]  esomeprazole (NEXIUM) 40 MG capsule Take 1 capsule (40 mg total) by mouth daily. 03/19/21   [name], MD  levothyroxine (SYNTHROID) 175 MCG tablet Take 1 tablet (175 mcg total) by mouth daily before breakfast. 03/28/21   [name], MD  memantine [facility]) 5 MG tablet Take half tablet (5 mg) daily for 2 weeks, then increase to the full tablet at 5 mg bid 04/26/21   Rondel Jumbo, PA-C  olmesartan-hydrochlorothiazide (BENICAR HCT) 40-25 MG tablet Take 1 tablet by mouth daily. As directed 03/05/21   Adrian Prows, MD  rosuvastatin (CRESTOR) 5 MG tablet TAKE ONE TABLET BY MOUTH DAILY AT BEDTIME 05/03/21   [name], MD  tadalafil (CIALIS) 5 MG tablet Take 5 mg by mouth at bedtime.    [provider]  Testosterone 1.62 % GEL Apply 3 Pump topically daily. 10/02/18   [provider]  traZODone (DESYREL) 100 MG tablet TAKE 1 TABLET BY MOUTH AT BEDTIME AS NEEDED FOR SLEEP. 05/28/21   [name], MD  valACYclovir (VALTREX) 1000 MG tablet Take 1 tablet (1,000 mg total) by mouth 2 (two) times daily. Patient taking differently: Take 1,000 mg by mouth as needed. 11/27/19   [name], MD  valACYclovir (VALTREX) 1000 MG tablet Take 1 tablet (1,000 mg total) by mouth 2 (two) times daily. 08/19/20   Garald Balding, MD  apixaban (ELIQUIS) 2.5 MG TABS tablet Take 1 tablet (2.5 mg total) by mouth 2 (two) times daily. 03/07/19 05/16/19  Leighton Parody, PA-C    Physical Exam: Vitals:   09/05/21 1342 09/05/21 1345 09/05/21 1540  BP: 134/70  118/68  Pulse: 69  61   Resp: 16  18  Temp: 99.7 F (37.6 C)  100.2 F (37.9 C)  TempSrc: Oral  Oral  SpO2: 97%  96%  Weight:  120.2 kg   Height:  5' 10.5" (1.791 m)     Constitutional: NAD, calm, comfortable Vitals:   09/05/21 1342 09/05/21 1345 09/05/21 1540  BP: 134/70  118/68  Pulse: 69  61  Resp: 16  18  Temp: 99.7 F (37.6 C)  100.2 F (37.9 C)  TempSrc: Oral  Oral  SpO2: 97%  96%  Weight:  120.2 kg   Height:  5' 10.5" (1.791 m)    Eyes: PERRL, lids and conjunctivae normal ENMT: Mucous membranes are moist. Posterior pharynx clear of any exudate or lesions. Neck: normal, supple, no masses, no thyromegaly Respiratory: bilateral decreased breath sounds at bases, no wheezing, no crackles. Normal respiratory effort. No accessory muscle use.  Cardiovascular: S1 S2 positive, rate controlled. No extremity edema. 2+ pedal pulses.  Abdomen: no tenderness, no masses palpated. No hepatosplenomegaly. Bowel sounds positive.  Musculoskeletal: no clubbing / cyanosis.  Left lower extremity is swollen/erythematous/mildly tender FROM down the knee up to the ankle.  No ulcers, wounds noted.   Skin: no other rashes/petechiae  neurologic: CN 2-12 grossly intact. Moving extremities. No focal neurologic deficits.  Psychiatric: Normal judgment and insight. Alert and oriented x 3. Normal mood.    Labs on Admission: I have personally reviewed following labs and imaging studies  CBC: Recent Labs  Lab 09/05/21 1456  WBC 10.9*  NEUTROABS 8.5*  HGB 14.6  HCT 43.6  MCV 92.0  PLT 017   Basic Metabolic Panel: Recent Labs  Lab 09/05/21 1456  NA 134*  K 3.5  CL 96*  CO2 29  GLUCOSE 124*  BUN 17  CREATININE 1.22  CALCIUM 9.1   GFR: Estimated Creatinine Clearance: 74.8 mL/min (by C-G formula based on SCr of 1.22 mg/dL). Liver Function Tests: Recent Labs  Lab 09/05/21 1456  AST 23  ALT 25  ALKPHOS 47  BILITOT 0.9  PROT 7.5  ALBUMIN 3.8   No results for input(s): "LIPASE", "AMYLASE" in the last  168 hours. No results for input(s): "AMMONIA" in the last 168 hours. Coagulation Profile: No results for input(s): "INR", "PROTIME" in the last 168 hours. Cardiac Enzymes: No results for input(s): "CKTOTAL", "CKMB", "CKMBINDEX", "TROPONINI" in the last 168 hours. BNP (last 3 results) Recent Labs    03/11/21 1021  PROBNP 54   HbA1C: No results for input(s): "HGBA1C" in the last 72 hours. CBG: No results for input(s): "GLUCAP" in the last 168 hours. Lipid Profile: No results for input(s): "CHOL", "HDL", "LDLCALC", "TRIG", "CHOLHDL", "LDLDIRECT" in the last 72 hours. Thyroid Function Tests: No results for input(s): "TSH", "T4TOTAL", "FREET4", "T3FREE", "THYROIDAB" in the last 72 hours. Anemia Panel: No results for input(s): "VITAMINB12", "FOLATE", "FERRITIN", "TIBC", "IRON", "RETICCTPCT" in the last 72 hours. Urine analysis:    Component Value Date/Time   COLORURINE YELLOW 03/18/2021 1053   APPEARANCEUR CLEAR 03/18/2021 1053   LABSPEC 1.010 03/18/2021 1053   PHURINE 5.0 03/18/2021 1053   GLUCOSEU NEGATIVE 03/18/2021 1053   HGBUR NEGATIVE 03/18/2021 1053   BILIRUBINUR NEGATIVE 03/18/2021 1053   KETONESUR NEGATIVE 03/18/2021 1053   PROTEINUR NEGATIVE 08/05/2019 1833   UROBILINOGEN 0.2 03/18/2021 1053   NITRITE NEGATIVE 03/18/2021 1053   LEUKOCYTESUR NEGATIVE 03/18/2021 1053    Radiological Exams on Admission: VAS [place] LOWER EXTREMITY VENOUS (DVT) (ONLY [facility] & [facility])  Result Date: 09/05/2021  Lower Venous DVT Study Patient Name:  Jonathan Powers  Date of Exam:   09/05/2021 Medical Rec #: [id number]         Accession #:    [id number] Date of Birth: 09-30-1950         Patient Gender: M Patient Age:   32 years Exam Location[facility]  [facility] Procedure:      VAS [place] LOWER EXTREMITY VENOUS (DVT) Referring Phys: LESLIE SOFIA --------------------------------------------------------------------------------  Indications: Swelling.  Risk Factors: None identified. Limitations: Poor  ultrasound/tissue interface. Comparison Study: No prior studies. Performing Technologist: Oliver Hum RVT  Examination  Guidelines: A complete evaluation includes B-mode imaging, spectral Doppler, color Doppler, and power Doppler as needed of all accessible portions of each vessel. Bilateral testing is considered an integral part of a complete examination. Limited examinations for reoccurring indications may be performed as noted. The reflux portion of the exam is performed with the patient in reverse Trendelenburg.  +-----+---------------+---------+-----------+----------+--------------+ RIGHTCompressibilityPhasicitySpontaneityPropertiesThrombus Aging +-----+---------------+---------+-----------+----------+--------------+ CFV  Full           Yes      Yes                                 +-----+---------------+---------+-----------+----------+--------------+   +---------+---------------+---------+-----------+----------+-------------------+ LEFT     CompressibilityPhasicitySpontaneityPropertiesThrombus Aging      +---------+---------------+---------+-----------+----------+-------------------+ CFV      Full           Yes      Yes                                      +---------+---------------+---------+-----------+----------+-------------------+ SFJ      Full                                                             +---------+---------------+---------+-----------+----------+-------------------+ FV Prox  Full                                                             +---------+---------------+---------+-----------+----------+-------------------+ FV Mid   Full                                                             +---------+---------------+---------+-----------+----------+-------------------+ FV Distal               Yes      Yes                                      +---------+---------------+---------+-----------+----------+-------------------+ PFV       Full                                                             +---------+---------------+---------+-----------+----------+-------------------+ POP      Full           Yes      Yes                                      +---------+---------------+---------+-----------+----------+-------------------+ PTV      Full                                                             +---------+---------------+---------+-----------+----------+-------------------+  PERO                                                  Not well visualized +---------+---------------+---------+-----------+----------+-------------------+    Summary: RIGHT: - No evidence of common femoral vein obstruction.  LEFT: - There is no evidence of deep vein thrombosis in the lower extremity. However, portions of this examination were limited- see technologist comments above.  - A cystic structure, measuring 2.2 cm high by 2.9 cm wide by greater than 5.6 cm long, is found in the popliteal fossa.  *See table(s) above for measurements and observations. Electronically signed by Servando Snare MD on 09/05/2021 at 4:23:20 PM.    Final       Assessment/Plan  Left lower extremity cellulitis -Involving the left lower extremity from almost down from the knee to the ankle.  Duplex ultrasound negative for DVT. -Already started on Rocephin.  Add vancomycin. -Repeat a.m. labs including white count and CBC.  Hyponatremia -Mild.  Repeat a.m. labs.  Monitor  Hypothyroidism -Continue levothyroxine  Hypertension -Monitor blood pressure.  Will resume antihypertensives if blood pressure remains elevated  Obesity -Outpatient follow-up  DVT prophylaxis: Lovenox Code Status: DNR, confirmed by the patient Family Communication: Daughter at bedside Disposition Plan: Home in 1 to 2 days once clinically improves Consults called: None Admission status: MedSurg/observation  Severity of Illness: The appropriate patient status for this  patient is OBSERVATION. Observation status is judged to be reasonable and necessary in order to provide the required intensity of service to ensure the patient's safety. The patient's presenting symptoms, physical exam findings, and initial radiographic and laboratory data in the context of their medical condition is felt to place them at decreased risk for further clinical deterioration. Furthermore, it is anticipated that the patient will be medically stable for discharge from the hospital within 2 midnights of admission.     Aline August MD [facility]  09/05/2021, 5:06 PM

## 2021-09-05 NOTE — Progress Notes (Signed)
Pharmacy Antibiotic Note  Jonathan Powers is a 71 y.o. male admitted on 09/05/2021 with cellulitis.  Pharmacy has been consulted for vancomycin dosing.  Plan: Vancomycin 2g IV x1 then '1500mg'$  IV q24h.  (SCr 1.22, Vd 0.5, est AUC 461) Measure Vanc levels as needed.  Goal AUC = 400 - 550. Follow up renal function, culture results, and clinical course.   Height: 5' 10.5" (179.1 cm) Weight: 120.2 kg (265 lb) IBW/kg (Calculated) : 74.15  Temp (24hrs), Avg:100 F (37.8 C), Min:99.7 F (37.6 C), Max:100.2 F (37.9 C)  Recent Labs  Lab 09/05/21 1456  WBC 10.9*  CREATININE 1.22    Estimated Creatinine Clearance: 74.8 mL/min (by C-G formula based on SCr of 1.22 mg/dL).    Allergies  Allergen Reactions   Nsaids Shortness Of Breath    Naproxen specifically causes SOB/wheeze tolerates topical diclofenac without problem Tylenol OK   Tolmetin Shortness Of Breath    Naproxen specifically causes SOB/wheeze tolerates topical diclofenac without problem Tylenol OK   Penicillins Rash    Did it involve swelling of the face/tongue/throat, SOB, or low BP? No Did it involve sudden or severe rash/hives, skin peeling, or any reaction on the inside of your mouth or nose? No Did you need to seek medical attention at a hospital or doctor's office? No When did it last happen?       If all above answers are "NO", may proceed with cephalosporin use.       Thank you for allowing pharmacy to be a part of this patient's care.  Gretta Arab PharmD, BCPS Clinical Pharmacist [facility] main pharmacy [phone] 09/05/2021 5:18 PM

## 2021-09-05 NOTE — ED Provider Notes (Signed)
[facility] DEPT Provider Note   CSN: [id number] Arrival date & time: 09/05/21  1326     History  Chief Complaint  Patient presents with   Leg Pain   Leg Swelling    Jonathan Powers is a 71 y.o. male.  The history is provided by the patient. No language interpreter was used.  Leg Pain Location:  Leg Time since incident:  2 days Injury: no   Leg location:  L leg Pain details:    Quality:  Aching   Radiates to:  Does not radiate   Severity:  Moderate   Onset quality:  Gradual   Duration:  2 days   Timing:  Constant   Progression:  Worsening Chronicity:  New Dislocation: no   Foreign body present:  No foreign bodies Prior injury to area:  No Worsened by:  Nothing Ineffective treatments:  None tried Associated symptoms: no decreased ROM   Risk factors: no concern for non-accidental trauma and no recent illness        Home Medications Prior to Admission medications   Medication Sig Start Date End Date Taking? Authorizing Provider  acetaminophen (TYLENOL) 500 MG tablet Take 1,000 mg by mouth every 6 (six) hours as needed for moderate pain.     [provider]  buPROPion (WELLBUTRIN SR) 150 MG 12 hr tablet Take 1 tablet (150 mg total) by mouth 2 (two) times daily. 1 tablet in the morning, 1 tablet in the late afternoon 03/04/21   Adrian Prows, MD  cetirizine (ZYRTEC) 10 MG tablet Take 10 mg by mouth daily.    [provider]  esomeprazole (NEXIUM) 40 MG capsule Take 1 capsule (40 mg total) by mouth daily. 03/19/21   [name], MD  levothyroxine (SYNTHROID) 175 MCG tablet Take 1 tablet (175 mcg total) by mouth daily before breakfast. 03/28/21   [name], MD  memantine [facility]) 5 MG tablet Take half tablet (5 mg) daily for 2 weeks, then increase to the full tablet at 5 mg bid 04/26/21   Rondel Jumbo, PA-C  olmesartan-hydrochlorothiazide (BENICAR HCT) 40-25 MG tablet Take 1 tablet by mouth daily. As directed 03/05/21    Adrian Prows, MD  rosuvastatin (CRESTOR) 5 MG tablet TAKE ONE TABLET BY MOUTH DAILY AT BEDTIME 05/03/21   [name], MD  tadalafil (CIALIS) 5 MG tablet Take 5 mg by mouth at bedtime.    [provider]  Testosterone 1.62 % GEL Apply 3 Pump topically daily. 10/02/18   [provider]  traZODone (DESYREL) 100 MG tablet TAKE 1 TABLET BY MOUTH AT BEDTIME AS NEEDED FOR SLEEP. 05/28/21   [name], MD  valACYclovir (VALTREX) 1000 MG tablet Take 1 tablet (1,000 mg total) by mouth 2 (two) times daily. Patient taking differently: Take 1,000 mg by mouth as needed. 11/27/19   [name], MD  valACYclovir (VALTREX) 1000 MG tablet Take 1 tablet (1,000 mg total) by mouth 2 (two) times daily. 08/19/20   Garald Balding, MD  apixaban (ELIQUIS) 2.5 MG TABS tablet Take 1 tablet (2.5 mg total) by mouth 2 (two) times daily. 03/07/19 05/16/19  Leighton Parody, PA-C      Allergies    Nsaids, Tolmetin, and Penicillins    Review of Systems   Review of Systems  All other systems reviewed and are negative.   Physical Exam Updated Vital Signs BP 134/70 (BP Location: Right Arm)   Pulse 69   Temp 99.7 F (  37.6 C) (Oral)   Resp 16   Ht 5' 10.5" (1.791 m)   Wt 120.2 kg   SpO2 97%   BMI 37.49 kg/m  Physical Exam Vitals and nursing note reviewed.  Constitutional:      General: He is not in acute distress.    Appearance: He is well-developed.  HENT:     Head: Normocephalic and atraumatic.  Eyes:     Conjunctiva/sclera: Conjunctivae normal.  Cardiovascular:     Rate and Rhythm: Normal rate and regular rhythm.     Heart sounds: No murmur heard. Pulmonary:     Effort: Pulmonary effort is normal. No respiratory distress.     Breath sounds: Normal breath sounds.  Musculoskeletal:        General: Swelling present.     Comments: Swollen tender  left lower leg, positive homan's   Skin:    General: Skin is warm and dry.     Capillary Refill: Capillary refill takes less than 2  seconds.  Neurological:     Mental Status: He is alert.  Psychiatric:        Mood and Affect: Mood normal.     ED Results / Procedures / Treatments   Labs (all labs ordered are listed, but only abnormal results are displayed) Labs Reviewed  CBC WITH DIFFERENTIAL/PLATELET - Abnormal; Notable for the following components:      Result Value   WBC 10.9 (*)    Neutro Abs 8.5 (*)    All other components within normal limits  COMPREHENSIVE METABOLIC PANEL - Abnormal; Notable for the following components:   Sodium 134 (*)    Chloride 96 (*)    Glucose, Bld 124 (*)    All other components within normal limits  CULTURE, BLOOD (ROUTINE X 2)  CULTURE, BLOOD (ROUTINE X 2)    EKG None  Radiology No results found.  Procedures Procedures    Medications Ordered in ED Medications  cefTRIAXone (ROCEPHIN) injection 2 g (has no administration in time range)    ED Course/ Medical Decision Making/ A&P                           Medical Decision Making Pt complains of redness and swelling to left leg,   Amount and/or Complexity of Data Reviewed External Data Reviewed: notes.    Details: primary notes reviewed Labs: ordered. Decision-making details documented in ED Course.    Details: wbc 10.9  sodium is 134 Radiology: ordered.    Details: Vascular ultrasound  no dvt Discussion of management or test interpretation with external provider(s): Hospitalist consulted and will admit    Risk OTC drugs. Prescription drug management. Decision regarding hospitalization. Risk Details: Pt given Rocephin 2gram  IV.             Final Clinical Impression(s) / ED Diagnoses Final diagnoses:  Cellulitis of left lower extremity    Rx / DC Orders ED Discharge Orders     None         Sidney Ace 09/05/21 1702    Carmin Muskrat, MD 09/06/21 [phone]

## 2021-09-05 NOTE — ED Triage Notes (Signed)
Patient c/o  left lower leg pain, swelling and redness x 48 hours ago. Patient states he has had a fever 102 .

## 2021-09-05 NOTE — ED Provider Triage Note (Signed)
Emergency Medicine Provider Triage Evaluation Note  AADIN GAUT , a 71 y.o. male  was evaluated in triage.  Pt complains of left leg swelling and pain   Review of Systems  Positive: fever Negative: Dvt risk  Physical Exam  BP 134/70 (BP Location: Right Arm)   Pulse 69   Temp 99.7 F (37.6 C) (Oral)   Resp 16   Ht 5' 10.5" (1.791 m)   Wt 120.2 kg   SpO2 97%   BMI 37.49 kg/m  Gen:   Awake, no distress   Resp:  Normal effort  MSK:   Moves extremities without difficulty  Other:  Red swollen left lower leg,    Medical Decision Making  Medically screening exam initiated at 1:58 PM.  Appropriate orders placed.  KERMITT HARJO was informed that the remainder of the evaluation will be completed by another provider, this initial triage assessment does not replace that evaluation, and the importance of remaining in the ED until their evaluation is complete.     Fransico Meadow, [place] 09/05/21 1400

## 2021-09-05 NOTE — Progress Notes (Signed)
Left lower extremity venous duplex has been completed. Preliminary results can be found in CV Proc through chart review.  Results were given to [facility] PA.  09/05/21 2:46 PM Carlos Levering RVT

## 2021-09-06 DIAGNOSIS — M7989 Other specified soft tissue disorders: Secondary | ICD-10-CM | POA: Diagnosis not present

## 2021-09-06 DIAGNOSIS — L0390 Cellulitis, unspecified: Secondary | ICD-10-CM | POA: Diagnosis present

## 2021-09-06 DIAGNOSIS — E063 Autoimmune thyroiditis: Secondary | ICD-10-CM | POA: Diagnosis present

## 2021-09-06 DIAGNOSIS — E031 Congenital hypothyroidism without goiter: Secondary | ICD-10-CM | POA: Diagnosis not present

## 2021-09-06 DIAGNOSIS — L03114 Cellulitis of left upper limb: Secondary | ICD-10-CM | POA: Diagnosis not present

## 2021-09-06 DIAGNOSIS — Z833 Family history of diabetes mellitus: Secondary | ICD-10-CM | POA: Diagnosis not present

## 2021-09-06 DIAGNOSIS — Z820 Family history of epilepsy and other diseases of the nervous system: Secondary | ICD-10-CM | POA: Diagnosis not present

## 2021-09-06 DIAGNOSIS — E669 Obesity, unspecified: Secondary | ICD-10-CM | POA: Diagnosis present

## 2021-09-06 DIAGNOSIS — Z79899 Other long term (current) drug therapy: Secondary | ICD-10-CM | POA: Diagnosis not present

## 2021-09-06 DIAGNOSIS — L03115 Cellulitis of right lower limb: Secondary | ICD-10-CM | POA: Diagnosis not present

## 2021-09-06 DIAGNOSIS — I10 Essential (primary) hypertension: Secondary | ICD-10-CM | POA: Diagnosis present

## 2021-09-06 DIAGNOSIS — Z7901 Long term (current) use of anticoagulants: Secondary | ICD-10-CM | POA: Diagnosis not present

## 2021-09-06 DIAGNOSIS — L03116 Cellulitis of left lower limb: Secondary | ICD-10-CM | POA: Diagnosis present

## 2021-09-06 DIAGNOSIS — R059 Cough, unspecified: Secondary | ICD-10-CM | POA: Diagnosis not present

## 2021-09-06 DIAGNOSIS — G4733 Obstructive sleep apnea (adult) (pediatric): Secondary | ICD-10-CM | POA: Diagnosis present

## 2021-09-06 DIAGNOSIS — Z8249 Family history of ischemic heart disease and other diseases of the circulatory system: Secondary | ICD-10-CM | POA: Diagnosis not present

## 2021-09-06 DIAGNOSIS — Z20822 Contact with and (suspected) exposure to covid-19: Secondary | ICD-10-CM | POA: Diagnosis present

## 2021-09-06 DIAGNOSIS — E7801 Familial hypercholesterolemia: Secondary | ICD-10-CM | POA: Diagnosis not present

## 2021-09-06 DIAGNOSIS — E783 Hyperchylomicronemia: Secondary | ICD-10-CM | POA: Diagnosis not present

## 2021-09-06 DIAGNOSIS — E871 Hypo-osmolality and hyponatremia: Secondary | ICD-10-CM

## 2021-09-06 DIAGNOSIS — K219 Gastro-esophageal reflux disease without esophagitis: Secondary | ICD-10-CM | POA: Diagnosis present

## 2021-09-06 DIAGNOSIS — Z8261 Family history of arthritis: Secondary | ICD-10-CM | POA: Diagnosis not present

## 2021-09-06 DIAGNOSIS — E039 Hypothyroidism, unspecified: Secondary | ICD-10-CM | POA: Diagnosis not present

## 2021-09-06 DIAGNOSIS — E785 Hyperlipidemia, unspecified: Secondary | ICD-10-CM | POA: Diagnosis present

## 2021-09-06 DIAGNOSIS — E032 Hypothyroidism due to medicaments and other exogenous substances: Secondary | ICD-10-CM | POA: Diagnosis not present

## 2021-09-06 DIAGNOSIS — E876 Hypokalemia: Secondary | ICD-10-CM | POA: Diagnosis present

## 2021-09-06 DIAGNOSIS — Z880 Allergy status to penicillin: Secondary | ICD-10-CM | POA: Diagnosis not present

## 2021-09-06 DIAGNOSIS — Z886 Allergy status to analgesic agent status: Secondary | ICD-10-CM | POA: Diagnosis not present

## 2021-09-06 DIAGNOSIS — M109 Gout, unspecified: Secondary | ICD-10-CM | POA: Diagnosis present

## 2021-09-06 DIAGNOSIS — Z96643 Presence of artificial hip joint, bilateral: Secondary | ICD-10-CM | POA: Diagnosis present

## 2021-09-06 DIAGNOSIS — J302 Other seasonal allergic rhinitis: Secondary | ICD-10-CM | POA: Diagnosis present

## 2021-09-06 DIAGNOSIS — Z79890 Hormone replacement therapy (postmenopausal): Secondary | ICD-10-CM | POA: Diagnosis not present

## 2021-09-06 DIAGNOSIS — Z66 Do not resuscitate: Secondary | ICD-10-CM | POA: Diagnosis present

## 2021-09-06 DIAGNOSIS — R0602 Shortness of breath: Secondary | ICD-10-CM | POA: Diagnosis not present

## 2021-09-06 LAB — COMPREHENSIVE METABOLIC PANEL
ALT: 22 U/L (ref 0–44)
AST: 24 U/L (ref 15–41)
Albumin: 3.2 g/dL — ABNORMAL LOW (ref 3.5–5.0)
Alkaline Phosphatase: 42 U/L (ref 38–126)
Anion gap: 9 (ref 5–15)
BUN: 13 mg/dL (ref 8–23)
CO2: 25 mmol/L (ref 22–32)
Calcium: 8.6 mg/dL — ABNORMAL LOW (ref 8.9–10.3)
Chloride: 104 mmol/L (ref 98–111)
Creatinine, Ser: 0.9 mg/dL (ref 0.61–1.24)
GFR, Estimated: >60 mL/min (ref >60)
Glucose, Bld: 106 mg/dL — ABNORMAL HIGH (ref 70–99)
Potassium: 3.3 mmol/L — ABNORMAL LOW (ref 3.5–5.1)
Sodium: 138 mmol/L (ref 135–145)
Total Bilirubin: 0.8 mg/dL (ref 0.3–1.2)
Total Protein: 6.3 g/dL — ABNORMAL LOW (ref 6.5–8.1)

## 2021-09-06 LAB — CBC
HCT: 38.5 % — ABNORMAL LOW (ref 39.0–52.0)
Hemoglobin: 13 g/dL (ref 13.0–17.0)
MCH: 31.1 pg (ref 26.0–34.0)
MCHC: 33.8 g/dL (ref 30.0–36.0)
MCV: 92.1 fL (ref 80.0–100.0)
Platelets: 230 10*3/uL (ref 150–400)
RBC: 4.18 MIL/uL — ABNORMAL LOW (ref 4.22–5.81)
RDW: 13 % (ref 11.5–15.5)
WBC: 9.9 10*3/uL (ref 4.0–10.5)
nRBC: 0 % (ref 0.0–0.2)

## 2021-09-06 LAB — C-REACTIVE PROTEIN: CRP: 14.5 mg/dL — ABNORMAL HIGH (ref <1.0)

## 2021-09-06 LAB — MAGNESIUM: Magnesium: 1.6 mg/dL — ABNORMAL LOW (ref 1.7–2.4)

## 2021-09-06 MED ORDER — POTASSIUM CHLORIDE CRYS ER 20 MEQ PO TBCR
40.0000 meq | EXTENDED_RELEASE_TABLET | Freq: Once | ORAL | Status: AC
  Administered 2021-09-06: 40 meq via ORAL
  Filled 2021-09-06: qty 2

## 2021-09-06 MED ORDER — MAGNESIUM SULFATE 2 GM/50ML IV SOLN
2.0000 g | Freq: Once | INTRAVENOUS | Status: AC
  Administered 2021-09-06: 2 g via INTRAVENOUS
  Filled 2021-09-06: qty 50

## 2021-09-06 MED ORDER — VANCOMYCIN HCL IN DEXTROSE 1-5 GM/200ML-% IV SOLN
1000.0000 mg | Freq: Two times a day (BID) | INTRAVENOUS | Status: DC
  Administered 2021-09-06 – 2021-09-09 (×7): 1000 mg via INTRAVENOUS
  Filled 2021-09-06 (×8): qty 200

## 2021-09-06 NOTE — Progress Notes (Signed)
PROGRESS NOTE    Jonathan Powers  HMC:[id number] DOB: 29-May-1950 DOA: 09/05/2021 PCP: Jonathan Lima, MD   Brief Narrative:   71 y.o. male with medical history significant of hypertension, hypothyroidism, obstructive sleep apnea on CPAP presented with progressively worsening left lower extremity swelling and redness.  On the presentation, he was found to have left lower extremity cellulitis and started on broad-spectrum antibiotics.  Lower extremity duplex was negative for DVT.  Assessment & Plan:   Left lower extremity cellulitis -Involving the left lower extremity from almost down from the knee to the ankle.  Duplex ultrasound negative for DVT. -Continue Rocephin and vancomycin.  Follow cultures.   Hyponatremia -Resolved  Hypokalemia -Replace.  Repeat AM labs  Hypomagnesemia -Replace.  Repeat a.m. labs  Hypothyroidism -Continue levothyroxine  Hypertension -Monitor blood pressure.  Blood pressure on the lower side.  Hold antihypertensives for now.  Obesity -Outpatient follow-up   DVT prophylaxis: Lovenox Code Status: Full Family Communication: None at bedside Disposition Plan: Status is: Observation The patient will require care spanning > 2 midnights and should be moved to inpatient because: Need for IV antibiotics  Consultants: None  Procedures: None  Antimicrobials: Rocephin and vancomycin from 09/05/2021 onwards   Subjective: Patient seen and examined at bedside.  Still complains of left lower extremity swelling, redness and pain; only slight improvement.  Does not feel ready to go home.  No overnight temperature spikes, nausea, vomiting.  Objective: Vitals:   09/05/21 2206 09/05/21 2330 09/06/21 0413 09/06/21 0423  BP:  115/68 (!) 106/41   Pulse:  60 64   Resp:  18 18   Temp:  99.5 F (37.5 C) 99.2 F (37.3 C)   TempSrc:  Oral Oral   SpO2:  93% 96%   Weight: 118.2 kg   118.2 kg  Height:        Intake/Output Summary (Last 24 hours) at 09/06/2021  0727 Last data filed at 09/06/2021 0600 Gross per 24 hour  Intake 1528.92 ml  Output --  Net 1528.92 ml   Filed Weights   09/05/21 1345 09/05/21 2206 09/06/21 0423  Weight: 120.2 kg 118.2 kg 118.2 kg    Examination:  General: On room air.  No distress ENT/neck: No thyromegaly.  JVD is not elevated  respiratory: Decreased breath sounds at bases bilaterally with some crackles; no wheezing  CVS: S1-S2 heard, rate controlled currently Abdominal: Soft, obese, nontender, slightly distended; no organomegaly, normal bowel sounds are heard Extremities: Trace lower extremity edema; no cyanosis  CNS: Awake and alert.  No focal neurologic deficit.  Moves extremities Lymph: No obvious lymphadenopathy Skin:Left lower extremity is swollen/erythematous/mildly tender FROM down the knee up to the ankle; slightly improved compared to yesterday.  No ulcers, wounds noted.   psych: Affect, judgment and mood are normal  musculoskeletal: No obvious other joint swelling/deformity      Data Reviewed: I have personally reviewed following labs and imaging studies  CBC: Recent Labs  Lab 09/05/21 1456 09/05/21 1958 09/06/21 0330  WBC 10.9* 9.3 9.9  NEUTROABS 8.5*  --   --   HGB 14.6 13.5 13.0  HCT 43.6 40.0 38.5*  MCV 92.0 91.5 92.1  PLT 262 217 662   Basic Metabolic Panel: Recent Labs  Lab 09/05/21 1456 09/05/21 1958 09/06/21 0330  NA 134*  --  138  K 3.5  --  3.3*  CL 96*  --  104  CO2 29  --  25  GLUCOSE 124*  --  106*  BUN  17  --  13  CREATININE 1.22 0.88 0.90  CALCIUM 9.1  --  8.6*  MG  --   --  1.6*   GFR: Estimated Creatinine Clearance: 100.6 mL/min (by C-G formula based on SCr of 0.9 mg/dL). Liver Function Tests: Recent Labs  Lab 09/05/21 1456 09/06/21 0330  AST 23 24  ALT 25 22  ALKPHOS 47 42  BILITOT 0.9 0.8  PROT 7.5 6.3*  ALBUMIN 3.8 3.2*   No results for input(s): "LIPASE", "AMYLASE" in the last 168 hours. No results for input(s): "AMMONIA" in the last 168  hours. Coagulation Profile: No results for input(s): "INR", "PROTIME" in the last 168 hours. Cardiac Enzymes: No results for input(s): "CKTOTAL", "CKMB", "CKMBINDEX", "TROPONINI" in the last 168 hours. BNP (last 3 results) Recent Labs    03/11/21 1021  PROBNP 54   HbA1C: No results for input(s): "HGBA1C" in the last 72 hours. CBG: No results for input(s): "GLUCAP" in the last 168 hours. Lipid Profile: No results for input(s): "CHOL", "HDL", "LDLCALC", "TRIG", "CHOLHDL", "LDLDIRECT" in the last 72 hours. Thyroid Function Tests: No results for input(s): "TSH", "T4TOTAL", "FREET4", "T3FREE", "THYROIDAB" in the last 72 hours. Anemia Panel: No results for input(s): "VITAMINB12", "FOLATE", "FERRITIN", "TIBC", "IRON", "RETICCTPCT" in the last 72 hours. Sepsis Labs: No results for input(s): "PROCALCITON", "LATICACIDVEN" in the last 168 hours.  No results found for this or any previous visit (from the past 240 hour(s)).       Radiology Studies: VAS [place] LOWER EXTREMITY VENOUS (DVT) (ONLY [facility])  Result Date: 09/05/2021  Lower Venous DVT Study Patient Name:  Jonathan Powers  Date of Exam:   09/05/2021 Medical Rec #: [id number]         Accession #:    [id number] Date of Birth: Jan 02, 1951         Patient Gender: M Patient Age:   71 years Exam Location:  [facility] Procedure:      VAS [place] LOWER EXTREMITY VENOUS (DVT) Referring Phys: Jonathan Powers --------------------------------------------------------------------------------  Indications: Swelling.  Risk Factors: None identified. Limitations: Poor ultrasound/tissue interface. Comparison Study: No prior studies. Performing Technologist: Oliver Hum RVT  Examination Guidelines: A complete evaluation includes B-mode imaging, spectral Doppler, color Doppler, and power Doppler as needed of all accessible portions of each vessel. Bilateral testing is considered an integral part of a complete examination. Limited examinations for reoccurring  indications may be performed as noted. The reflux portion of the exam is performed with the patient in reverse Trendelenburg.  +-----+---------------+---------+-----------+----------+--------------+ RIGHTCompressibilityPhasicitySpontaneityPropertiesThrombus Aging +-----+---------------+---------+-----------+----------+--------------+ CFV  Full           Yes      Yes                                 +-----+---------------+---------+-----------+----------+--------------+   +---------+---------------+---------+-----------+----------+-------------------+ LEFT     CompressibilityPhasicitySpontaneityPropertiesThrombus Aging      +---------+---------------+---------+-----------+----------+-------------------+ CFV      Full           Yes      Yes                                      +---------+---------------+---------+-----------+----------+-------------------+ SFJ      Full                                                             +---------+---------------+---------+-----------+----------+-------------------+  FV Prox  Full                                                             +---------+---------------+---------+-----------+----------+-------------------+ FV Mid   Full                                                             +---------+---------------+---------+-----------+----------+-------------------+ FV Distal               Yes      Yes                                      +---------+---------------+---------+-----------+----------+-------------------+ PFV      Full                                                             +---------+---------------+---------+-----------+----------+-------------------+ POP      Full           Yes      Yes                                      +---------+---------------+---------+-----------+----------+-------------------+ PTV      Full                                                              +---------+---------------+---------+-----------+----------+-------------------+ PERO                                                  Not well visualized +---------+---------------+---------+-----------+----------+-------------------+    Summary: RIGHT: - No evidence of common femoral vein obstruction.  LEFT: - There is no evidence of deep vein thrombosis in the lower extremity. However, portions of this examination were limited- see technologist comments above.  - A cystic structure, measuring 2.2 cm high by 2.9 cm wide by greater than 5.6 cm long, is found in the popliteal fossa.  *See table(s) above for measurements and observations. Electronically signed by Servando Snare MD on 09/05/2021 at 4:23:20 PM.    Final         Scheduled Meds:  enoxaparin (LOVENOX) injection  60 mg Subcutaneous Q24H   irbesartan  300 mg Oral Daily   And   hydrochlorothiazide  25 mg Oral Daily   levothyroxine  175 mcg Oral QAC breakfast   loratadine  10 mg Oral Daily   pantoprazole  40 mg Oral Daily   rosuvastatin  5  mg Oral QHS   Continuous Infusions:  sodium chloride 75 mL/hr at 09/05/21 2242   cefTRIAXone (ROCEPHIN)  IV     vancomycin            Aline August, MD [facility] 09/06/2021, 7:27 AM

## 2021-09-06 NOTE — Progress Notes (Signed)
  Transition of Care [facility]) Screening Note   Patient Details  Name: Jonathan Powers Date of Birth: 01/28/1951   Transition of Care [facility]) CM/SW Contact:    Lennart Pall, LCSW Phone Number: 09/06/2021, 10:36 AM    Transition of Care Department [facility]) has reviewed patient and no TOC needs have been identified at this time. We will continue to monitor patient advancement through interdisciplinary progression rounds. If new patient transition needs arise, please place a TOC consult.

## 2021-09-06 NOTE — Progress Notes (Signed)
Pharmacy Antibiotic Note  Jonathan Powers is a 71 y.o. male who presented to the ED on 09/05/2021 with c/o LLE pain redness and swelling.  He's currently on ceftriaxone and vancomycin for cellulitis.  Today, 09/06/2021: - day #2 abx - Tmax 100.2 - wbc wnl - scr down 0.90 (crcl~100)  Plan: - adjust vancomycin to 1000 mg IV q12h for est AUC 479 - continue ceftriaxone 2gm IV q24h per MD  _____________________________________________  Height: 5' 10.5" (179.1 cm) Weight: 118.2 kg (260 lb 9.3 oz) IBW/kg (Calculated) : 74.15  Temp (24hrs), Avg:99.5 F (37.5 C), Min:99.2 F (37.3 C), Max:100.2 F (37.9 C)  Recent Labs  Lab 09/05/21 1456 09/05/21 1958 09/06/21 0330  WBC 10.9* 9.3 9.9  CREATININE 1.22 0.88 0.90    Estimated Creatinine Clearance: 100.6 mL/min (by C-G formula based on SCr of 0.9 mg/dL).    Allergies  Allergen Reactions   Nsaids Shortness Of Breath    Naproxen specifically causes SOB/wheeze tolerates topical diclofenac without problem Tylenol OK   Tolmetin Shortness Of Breath    Naproxen specifically causes SOB/wheeze tolerates topical diclofenac without problem Tylenol OK   Penicillins Rash    Did it involve swelling of the face/tongue/throat, SOB, or low BP? No Did it involve sudden or severe rash/hives, skin peeling, or any reaction on the inside of your mouth or nose? No Did you need to seek medical attention at a hospital or doctor's office? No When did it last happen?       If all above answers are "NO", may proceed with cephalosporin use.     Thank you for allowing pharmacy to be a part of this patient's care.  Lynelle Doctor 09/06/2021 10:50 AM

## 2021-09-07 DIAGNOSIS — L03115 Cellulitis of right lower limb: Secondary | ICD-10-CM

## 2021-09-07 LAB — CBC WITH DIFFERENTIAL/PLATELET
Abs Immature Granulocytes: 0.03 10*3/uL (ref 0.00–0.07)
Basophils Absolute: 0 10*3/uL (ref 0.0–0.1)
Basophils Relative: 0 %
Eosinophils Absolute: 0.2 10*3/uL (ref 0.0–0.5)
Eosinophils Relative: 2 %
HCT: 38.3 % — ABNORMAL LOW (ref 39.0–52.0)
Hemoglobin: 13 g/dL (ref 13.0–17.0)
Immature Granulocytes: 0 %
Lymphocytes Relative: 13 %
Lymphs Abs: 1.2 10*3/uL (ref 0.7–4.0)
MCH: 31.2 pg (ref 26.0–34.0)
MCHC: 33.9 g/dL (ref 30.0–36.0)
MCV: 91.8 fL (ref 80.0–100.0)
Monocytes Absolute: 1.4 10*3/uL — ABNORMAL HIGH (ref 0.1–1.0)
Monocytes Relative: 14 %
Neutro Abs: 6.7 10*3/uL (ref 1.7–7.7)
Neutrophils Relative %: 71 %
Platelets: 269 10*3/uL (ref 150–400)
RBC: 4.17 MIL/uL — ABNORMAL LOW (ref 4.22–5.81)
RDW: 13.1 % (ref 11.5–15.5)
WBC: 9.6 10*3/uL (ref 4.0–10.5)
nRBC: 0 % (ref 0.0–0.2)

## 2021-09-07 LAB — MAGNESIUM: Magnesium: 2.1 mg/dL (ref 1.7–2.4)

## 2021-09-07 LAB — BASIC METABOLIC PANEL
Anion gap: 10 (ref 5–15)
BUN: 8 mg/dL (ref 8–23)
CO2: 26 mmol/L (ref 22–32)
Calcium: 8.8 mg/dL — ABNORMAL LOW (ref 8.9–10.3)
Chloride: 104 mmol/L (ref 98–111)
Creatinine, Ser: 0.87 mg/dL (ref 0.61–1.24)
GFR, Estimated: >60 mL/min (ref >60)
Glucose, Bld: 125 mg/dL — ABNORMAL HIGH (ref 70–99)
Potassium: 3.4 mmol/L — ABNORMAL LOW (ref 3.5–5.1)
Sodium: 140 mmol/L (ref 135–145)

## 2021-09-07 LAB — MRSA NEXT GEN BY PCR, NASAL: MRSA by PCR Next Gen: NOT DETECTED

## 2021-09-07 LAB — C-REACTIVE PROTEIN: CRP: 15.6 mg/dL — ABNORMAL HIGH (ref <1.0)

## 2021-09-07 MED ORDER — SENNOSIDES-DOCUSATE SODIUM 8.6-50 MG PO TABS
1.0000 | ORAL_TABLET | Freq: Every day | ORAL | Status: DC
  Administered 2021-09-08 – 2021-09-12 (×5): 1 via ORAL
  Filled 2021-09-07 (×6): qty 1

## 2021-09-07 MED ORDER — POTASSIUM CHLORIDE 20 MEQ PO PACK
40.0000 meq | PACK | Freq: Once | ORAL | Status: AC
  Administered 2021-09-07: 40 meq via ORAL
  Filled 2021-09-07: qty 2

## 2021-09-07 NOTE — Plan of Care (Signed)

## 2021-09-07 NOTE — Progress Notes (Signed)
PROGRESS NOTE    WAYLAND BAIK  CBJ:[id number] DOB: 07-15-1950 DOA: 09/05/2021 PCP: [name], MD   Brief Narrative:   71 y.o. male with medical history significant of hypertension, hypothyroidism, obstructive sleep apnea on CPAP presented with progressively worsening left lower extremity swelling and redness.  On the presentation, he was found to have left lower extremity cellulitis and started on broad-spectrum antibiotics.  Lower extremity duplex was negative for DVT.  Assessment & Plan:   Left lower extremity cellulitis Does not appear septic -significant left lower extremity erythema/edema from almost down from the knee to the ankle -.  Duplex ultrasound negative for DVT. -Continue Rocephin and vancomycin.  Follow cultures.   Hyponatremia -Resolved  Hypokalemia -Replace.  Repeat AM labs  Hypomagnesemia -Replace.  Repeat a.m. labs  Hypothyroidism -Continue levothyroxine  Hypertension -Monitor blood pressure.  Blood pressure on the lower side.  Hold antihypertensives for now.  Obesity -Outpatient follow-up   Body mass index is 36.86 kg/m. Trying to loose weight by excerixzing and dieting Reports stopped using cpap after loosing weight  DVT prophylaxis: Lovenox Code Status: Full Family Communication: None at bedside Disposition Plan:  The patient will require care spanning > 2 midnights and should be moved to inpatient because: Need for IV antibiotics  Consultants: None  Procedures: None  Antimicrobials: Rocephin and vancomycin from 09/05/2021 onwards   Subjective:  Left leg is achy Fever trending down Wbc normal K  a little low   Objective: Vitals:   09/06/21 0936 09/06/21 1252 09/06/21 2019 09/07/21 0531  BP: 122/65 122/72 (!) 101/50 131/70  Pulse: 64 60 61 65  Resp: '20 18 17 18  '$ Temp: 99.2 F (37.3 C) 98.8 F (37.1 C) 100 F (37.8 C) 99.5 F (37.5 C)  TempSrc:   Oral Oral  SpO2: 95% 97% 97% 97%  Weight:      Height:         Intake/Output Summary (Last 24 hours) at 09/07/2021 1208 Last data filed at 09/07/2021 1047 Gross per 24 hour  Intake 1940 ml  Output --  Net 1940 ml   Filed Weights   09/05/21 1345 09/05/21 2206 09/06/21 0423  Weight: 120.2 kg 118.2 kg 118.2 kg    Examination:  General: On room air.  No distress ENT/neck: No thyromegaly.  JVD is not elevated  respiratory: Decreased breath sounds at bases bilaterally with some crackles; no wheezing  CVS: S1-S2 heard, rate controlled currently Abdominal: Soft, obese, nontender, slightly distended; no organomegaly, normal bowel sounds are heard Extremities: Trace lower extremity edema; no cyanosis  CNS: Awake and alert.  No focal neurologic deficit.  Moves extremities Lymph: No obvious lymphadenopathy Skin:Left lower extremity is swollen/erythematous/mildly tender FROM down the knee up to the ankle; slightly improved compared to yesterday.  No ulcers, wounds noted.   psych: Affect, judgment and mood are normal  musculoskeletal: No obvious other joint swelling/deformity      Data Reviewed: I have personally reviewed following labs and imaging studies  CBC: Recent Labs  Lab 09/05/21 1456 09/05/21 1958 09/06/21 0330 09/07/21 0304  WBC 10.9* 9.3 9.9 9.6  NEUTROABS 8.5*  --   --  6.7  HGB 14.6 13.5 13.0 13.0  HCT 43.6 40.0 38.5* 38.3*  MCV 92.0 91.5 92.1 91.8  PLT 262 217 230 160   Basic Metabolic Panel: Recent Labs  Lab 09/05/21 1456 09/05/21 1958 09/06/21 0330 09/07/21 0304  NA 134*  --  138 140  K 3.5  --  3.3* 3.4*  CL 96*  --  104 104  CO2 29  --  25 26  GLUCOSE 124*  --  106* 125*  BUN 17  --  13 8  CREATININE 1.22 0.88 0.90 0.87  CALCIUM 9.1  --  8.6* 8.8*  MG  --   --  1.6* 2.1   GFR: Estimated Creatinine Clearance: 104.1 mL/min (by C-G formula based on SCr of 0.87 mg/dL). Liver Function Tests: Recent Labs  Lab 09/05/21 1456 09/06/21 0330  AST 23 24  ALT 25 22  ALKPHOS 47 42  BILITOT 0.9 0.8  PROT 7.5  6.3*  ALBUMIN 3.8 3.2*   No results for input(s): "LIPASE", "AMYLASE" in the last 168 hours. No results for input(s): "AMMONIA" in the last 168 hours. Coagulation Profile: No results for input(s): "INR", "PROTIME" in the last 168 hours. Cardiac Enzymes: No results for input(s): "CKTOTAL", "CKMB", "CKMBINDEX", "TROPONINI" in the last 168 hours. BNP (last 3 results) Recent Labs    03/11/21 1021  PROBNP 54   HbA1C: No results for input(s): "HGBA1C" in the last 72 hours. CBG: No results for input(s): "GLUCAP" in the last 168 hours. Lipid Profile: No results for input(s): "CHOL", "HDL", "LDLCALC", "TRIG", "CHOLHDL", "LDLDIRECT" in the last 72 hours. Thyroid Function Tests: No results for input(s): "TSH", "T4TOTAL", "FREET4", "T3FREE", "THYROIDAB" in the last 72 hours. Anemia Panel: No results for input(s): "VITAMINB12", "FOLATE", "FERRITIN", "TIBC", "IRON", "RETICCTPCT" in the last 72 hours. Sepsis Labs: No results for input(s): "PROCALCITON", "LATICACIDVEN" in the last 168 hours.  Recent Results (from the past 240 hour(s))  Blood culture (routine x 2)     Status: None (Preliminary result)   Collection Time: 09/05/21  1:44 PM   Specimen: BLOOD  Result Value Ref Range Status   Specimen Description   Final    BLOOD LEFT ANTECUBITAL Performed at [facility] [address], [place], [place] [address]    Special Requests   Final    BOTTLES DRAWN AEROBIC AND ANAEROBIC Blood Culture adequate volume Performed at [facility] [address], [place], [place] [address]    Culture   Final    NO GROWTH 2 DAYS Performed at [facility] [address] [place], [place] [address]    Report Status PENDING  Incomplete  Blood culture (routine x 2)     Status: None (Preliminary result)   Collection Time: 09/05/21  4:20 PM   Specimen: BLOOD  Result Value Ref Range Status   Specimen Description   Final    BLOOD BLOOD RIGHT FOREARM Performed at  [facility] [address] [place], [place] [address]    Special Requests   Final    BOTTLES DRAWN AEROBIC AND ANAEROBIC Blood Culture adequate volume Performed at [facility] [address] [place], [place] [address]    Culture   Final    NO GROWTH 2 DAYS Performed at [facility] [address], [place], [place] [address]    Report Status PENDING  Incomplete         Radiology Studies: VAS [place] LOWER EXTREMITY VENOUS (DVT) (ONLY [facility] & [facility])  Result Date: 09/05/2021  Lower Venous DVT Study Patient Name:  MARVELLE CAUDILL  Date of Exam:   09/05/2021 Medical Rec #: [id number]         Accession #:    [id number] Date of Birth: 1950/12/10         Patient Gender: M Patient Age:  70 years Exam Location:  [facility] Procedure:      VAS [place] LOWER EXTREMITY VENOUS (DVT) Referring Phys: LESLIE SOFIA --------------------------------------------------------------------------------  Indications: Swelling.  Risk Factors: None identified. Limitations: Poor ultrasound/tissue interface. Comparison Study: No prior studies. Performing Technologist: Oliver Hum RVT  Examination Guidelines: A complete evaluation includes B-mode imaging, spectral Doppler, color Doppler, and power Doppler as needed of all accessible portions of each vessel. Bilateral testing is considered an integral part of a complete examination. Limited examinations for reoccurring indications may be performed as noted. The reflux portion of the exam is performed with the patient in reverse Trendelenburg.  +-----+---------------+---------+-----------+----------+--------------+ RIGHTCompressibilityPhasicitySpontaneityPropertiesThrombus Aging +-----+---------------+---------+-----------+----------+--------------+ CFV  Full           Yes      Yes                                 +-----+---------------+---------+-----------+----------+--------------+    +---------+---------------+---------+-----------+----------+-------------------+ LEFT     CompressibilityPhasicitySpontaneityPropertiesThrombus Aging      +---------+---------------+---------+-----------+----------+-------------------+ CFV      Full           Yes      Yes                                      +---------+---------------+---------+-----------+----------+-------------------+ SFJ      Full                                                             +---------+---------------+---------+-----------+----------+-------------------+ FV Prox  Full                                                             +---------+---------------+---------+-----------+----------+-------------------+ FV Mid   Full                                                             +---------+---------------+---------+-----------+----------+-------------------+ FV Distal               Yes      Yes                                      +---------+---------------+---------+-----------+----------+-------------------+ PFV      Full                                                             +---------+---------------+---------+-----------+----------+-------------------+ POP      Full           Yes      Yes                                      +---------+---------------+---------+-----------+----------+-------------------+  PTV      Full                                                             +---------+---------------+---------+-----------+----------+-------------------+ PERO                                                  Not well visualized +---------+---------------+---------+-----------+----------+-------------------+    Summary: RIGHT: - No evidence of common femoral vein obstruction.  LEFT: - There is no evidence of deep vein thrombosis in the lower extremity. However, portions of this examination were limited- see technologist comments above.  - A cystic  structure, measuring 2.2 cm high by 2.9 cm wide by greater than 5.6 cm long, is found in the popliteal fossa.  *See table(s) above for measurements and observations. Electronically signed by Servando Snare MD on 09/05/2021 at 4:23:20 PM.    Final         Scheduled Meds:  enoxaparin (LOVENOX) injection  60 mg Subcutaneous Q24H   levothyroxine  175 mcg Oral QAC breakfast   loratadine  10 mg Oral Daily   pantoprazole  40 mg Oral Daily   potassium chloride  40 mEq Oral Once   rosuvastatin  5 mg Oral QHS   senna-docusate  1 tablet Oral QHS   Continuous Infusions:  cefTRIAXone (ROCEPHIN)  IV 2 g (09/06/21 1616)   vancomycin 1,000 mg (09/07/21 0050)          Florencia Reasons, MD PhD FACP [facility] 09/07/2021, 12:08 PM

## 2021-09-08 ENCOUNTER — Inpatient Hospital Stay (HOSPITAL_COMMUNITY): Payer: Medicare Other

## 2021-09-08 DIAGNOSIS — L03115 Cellulitis of right lower limb: Secondary | ICD-10-CM | POA: Diagnosis not present

## 2021-09-08 LAB — BASIC METABOLIC PANEL
Anion gap: 7 (ref 5–15)
BUN: 7 mg/dL — ABNORMAL LOW (ref 8–23)
CO2: 28 mmol/L (ref 22–32)
Calcium: 8.6 mg/dL — ABNORMAL LOW (ref 8.9–10.3)
Chloride: 104 mmol/L (ref 98–111)
Creatinine, Ser: 0.76 mg/dL (ref 0.61–1.24)
GFR, Estimated: >60 mL/min (ref >60)
Glucose, Bld: 119 mg/dL — ABNORMAL HIGH (ref 70–99)
Potassium: 3.1 mmol/L — ABNORMAL LOW (ref 3.5–5.1)
Sodium: 139 mmol/L (ref 135–145)

## 2021-09-08 LAB — MAGNESIUM: Magnesium: 1.9 mg/dL (ref 1.7–2.4)

## 2021-09-08 LAB — SARS CORONAVIRUS 2 BY RT PCR: SARS Coronavirus 2 by RT PCR: NEGATIVE

## 2021-09-08 LAB — HEMOGLOBIN A1C
Hgb A1c MFr Bld: 5.3 % (ref 4.8–5.6)
Mean Plasma Glucose: 105.41 mg/dL

## 2021-09-08 MED ORDER — POTASSIUM CHLORIDE 20 MEQ PO PACK
40.0000 meq | PACK | ORAL | Status: AC
  Administered 2021-09-08 (×2): 40 meq via ORAL
  Filled 2021-09-08 (×2): qty 2

## 2021-09-08 MED ORDER — GUAIFENESIN ER 600 MG PO TB12
600.0000 mg | ORAL_TABLET | Freq: Two times a day (BID) | ORAL | Status: DC
  Administered 2021-09-08 – 2021-09-13 (×11): 600 mg via ORAL
  Filled 2021-09-08 (×11): qty 1

## 2021-09-08 MED ORDER — GADOBUTROL 1 MMOL/ML IV SOLN
10.0000 mL | Freq: Once | INTRAVENOUS | Status: AC | PRN
  Administered 2021-09-08: 10 mL via INTRAVENOUS

## 2021-09-08 NOTE — Plan of Care (Signed)
  Problem: Pain Managment: Goal: General experience of comfort will improve Outcome: Progressing   Problem: Safety: Goal: Ability to remain free from injury will improve Outcome: Progressing   

## 2021-09-08 NOTE — Progress Notes (Signed)
PROGRESS NOTE    Jonathan Powers  JZP:[id number] DOB: 1950/10/28 DOA: 09/05/2021 PCP: [name], MD   Brief Narrative:   71 y.o. male with medical history significant of hypertension, hypothyroidism, obstructive sleep apnea on CPAP presented with progressively worsening left lower extremity swelling and redness.  On the presentation, he was found to have left lower extremity cellulitis and started on broad-spectrum antibiotics.  Lower extremity duplex was negative for DVT.  Assessment & Plan:   Left lower extremity cellulitis Does not appear septic, blood culture no growth -persistent significant left lower extremity erythema/edema from almost down from the knee to the ankle -.  Duplex ultrasound negative for DVT. -negative x ray, will proceed with MRI -Continue Rocephin and vancomycin.    Hyponatremia -Resolved  Hypokalemia -Remain low , continue to replace.  Repeat AM labs  Hypomagnesemia -Replaced, improved  Hypothyroidism -Continue levothyroxine  Hypertension -Monitor blood pressure.  Blood pressure on the lower side.  Hold antihypertensives for now.  Cough? Lung sound clear Will obtain covid screening, sputum culture, cxr, add on mucinex    Body mass index is 36.86 kg/m.  Meet obesity criteria He is motivated to lose weight by diet and exercise  Reports stopped using cpap after loosing weight  DVT prophylaxis: Lovenox Code Status: Full Family Communication: None at bedside Disposition Plan:  The patient will require care spanning > 2 midnights and should be moved to inpatient because: Need for IV antibiotics  Consultants: None  Procedures: None  Antimicrobials: Rocephin and vancomycin from 09/05/2021 onwards   Subjective:   Started to cough with purlent /yellowish white sputum Left leg remains significantly edematous , erythema , report more pain , has good pulse  Fever trending down Wbc normal K remains low   Objective: Vitals:    09/07/21 1320 09/07/21 1531 09/07/21 2141 09/08/21 0526  BP: (!) 111/54  113/65 121/69  Pulse: 67  65 (!) 56  Resp: '14  17 17  '$ Temp: 99.1 F (37.3 C) 99.1 F (37.3 C) 99.1 F (37.3 C) 98.5 F (36.9 C)  TempSrc: Oral Oral Oral Oral  SpO2: 99%  97% 94%  Weight:      Height:        Intake/Output Summary (Last 24 hours) at 09/08/2021 1125 Last data filed at 09/08/2021 0858 Gross per 24 hour  Intake 1859.98 ml  Output --  Net 1859.98 ml   Filed Weights   09/05/21 1345 09/05/21 2206 09/06/21 0423  Weight: 120.2 kg 118.2 kg 118.2 kg    Examination:  General: On room air.  No distress ENT/neck: No thyromegaly.  JVD is not elevated  respiratory: Decreased breath sounds at bases bilaterally with some crackles; no wheezing  CVS: S1-S2 heard, rate controlled currently Abdominal: Soft, obese, nontender, slightly distended; no organomegaly, normal bowel sounds are heard Extremities: Trace lower extremity edema; no cyanosis  CNS: Awake and alert.  No focal neurologic deficit.  Moves extremities Lymph: No obvious lymphadenopathy Skin:Left lower extremity is swollen/erythematous/mildly tender FROM down the knee up to the ankle; slightly improved compared to yesterday.  No ulcers, wounds noted.   psych: Affect, judgment and mood are normal  musculoskeletal: No obvious other joint swelling/deformity      Data Reviewed: I have personally reviewed following labs and imaging studies  CBC: Recent Labs  Lab 09/05/21 1456 09/05/21 1958 09/06/21 0330 09/07/21 0304  WBC 10.9* 9.3 9.9 9.6  NEUTROABS 8.5*  --   --  6.7  HGB 14.6 13.5 13.0 13.0  HCT 43.6 40.0 38.5* 38.3*  MCV 92.0 91.5 92.1 91.8  PLT 262 217 230 267   Basic Metabolic Panel: Recent Labs  Lab 09/05/21 1456 09/05/21 1958 09/06/21 0330 09/07/21 0304 09/08/21 0330  NA 134*  --  138 140 139  K 3.5  --  3.3* 3.4* 3.1*  CL 96*  --  104 104 104  CO2 29  --  '25 26 28  '$ GLUCOSE 124*  --  106* 125* 119*  BUN 17  --  13 8  7*  CREATININE 1.22 0.88 0.90 0.87 0.76  CALCIUM 9.1  --  8.6* 8.8* 8.6*  MG  --   --  1.6* 2.1 1.9   GFR: Estimated Creatinine Clearance: 113.2 mL/min (by C-G formula based on SCr of 0.76 mg/dL). Liver Function Tests: Recent Labs  Lab 09/05/21 1456 09/06/21 0330  AST 23 24  ALT 25 22  ALKPHOS 47 42  BILITOT 0.9 0.8  PROT 7.5 6.3*  ALBUMIN 3.8 3.2*   No results for input(s): "LIPASE", "AMYLASE" in the last 168 hours. No results for input(s): "AMMONIA" in the last 168 hours. Coagulation Profile: No results for input(s): "INR", "PROTIME" in the last 168 hours. Cardiac Enzymes: No results for input(s): "CKTOTAL", "CKMB", "CKMBINDEX", "TROPONINI" in the last 168 hours. BNP (last 3 results) Recent Labs    03/11/21 1021  PROBNP 54   HbA1C: Recent Labs    09/08/21 0330  HGBA1C 5.3   CBG: No results for input(s): "GLUCAP" in the last 168 hours. Lipid Profile: No results for input(s): "CHOL", "HDL", "LDLCALC", "TRIG", "CHOLHDL", "LDLDIRECT" in the last 72 hours. Thyroid Function Tests: No results for input(s): "TSH", "T4TOTAL", "FREET4", "T3FREE", "THYROIDAB" in the last 72 hours. Anemia Panel: No results for input(s): "VITAMINB12", "FOLATE", "FERRITIN", "TIBC", "IRON", "RETICCTPCT" in the last 72 hours. Sepsis Labs: No results for input(s): "PROCALCITON", "LATICACIDVEN" in the last 168 hours.  Recent Results (from the past 240 hour(s))  Blood culture (routine x 2)     Status: None (Preliminary result)   Collection Time: 09/05/21  1:44 PM   Specimen: BLOOD  Result Value Ref Range Status   Specimen Description   Final    BLOOD LEFT ANTECUBITAL Performed at [facility] [address], [place], [place] [address]    Special Requests   Final    BOTTLES DRAWN AEROBIC AND ANAEROBIC Blood Culture adequate volume Performed at [facility] [address], [place], [place] [address]    Culture   Final    NO GROWTH 3 DAYS Performed at  [facility], [place] [address] [place], [place] [address]    Report Status PENDING  Incomplete  Blood culture (routine x 2)     Status: None (Preliminary result)   Collection Time: 09/05/21  4:20 PM   Specimen: BLOOD  Result Value Ref Range Status   Specimen Description   Final    BLOOD BLOOD RIGHT FOREARM Performed at [facility] [address], [place], [place] [address]    Special Requests   Final    BOTTLES DRAWN AEROBIC AND ANAEROBIC Blood Culture adequate volume Performed at [facility] [address], [place], [place] [address]    Culture   Final    NO GROWTH 3 DAYS Performed at [facility], [place] [address], [place], [place] [address]    Report Status PENDING  Incomplete  MRSA Next Gen by PCR, Nasal     Status: None  Collection Time: 09/07/21 10:41 AM   Specimen: Nasal Mucosa; Nasal Swab  Result Value Ref Range Status   MRSA by PCR Next Gen NOT DETECTED NOT DETECTED Final    Comment: (NOTE) The GeneXpert MRSA Assay (FDA approved for NASAL specimens only), is one component of a comprehensive MRSA colonization surveillance program. It is not intended to diagnose MRSA infection nor to guide or monitor treatment for MRSA infections. Test performance is not FDA approved in patients less than 46 years old. Performed at [facility], [place] [address], [place], [place] [address]          Radiology Studies: No results found.      Scheduled Meds:  enoxaparin (LOVENOX) injection  60 mg Subcutaneous Q24H   levothyroxine  175 mcg Oral QAC breakfast   loratadine  10 mg Oral Daily   pantoprazole  40 mg Oral Daily   potassium chloride  40 mEq Oral Q4H   rosuvastatin  5 mg Oral QHS   senna-docusate  1 tablet Oral QHS   Continuous Infusions:  cefTRIAXone (ROCEPHIN)  IV Stopped (09/07/21 1600)   vancomycin 1,000 mg (09/08/21 0045)          Florencia Reasons, MD PhD FACP [facility] 09/08/2021,  11:25 AM

## 2021-09-09 DIAGNOSIS — Z880 Allergy status to penicillin: Secondary | ICD-10-CM

## 2021-09-09 DIAGNOSIS — E7801 Familial hypercholesterolemia: Secondary | ICD-10-CM

## 2021-09-09 DIAGNOSIS — E031 Congenital hypothyroidism without goiter: Secondary | ICD-10-CM | POA: Diagnosis not present

## 2021-09-09 DIAGNOSIS — L03115 Cellulitis of right lower limb: Secondary | ICD-10-CM | POA: Diagnosis not present

## 2021-09-09 DIAGNOSIS — I10 Essential (primary) hypertension: Secondary | ICD-10-CM | POA: Diagnosis not present

## 2021-09-09 DIAGNOSIS — L03116 Cellulitis of left lower limb: Secondary | ICD-10-CM | POA: Diagnosis not present

## 2021-09-09 LAB — EXPECTORATED SPUTUM ASSESSMENT W GRAM STAIN, RFLX TO RESP C

## 2021-09-09 LAB — BASIC METABOLIC PANEL
Anion gap: 10 (ref 5–15)
BUN: 7 mg/dL — ABNORMAL LOW (ref 8–23)
CO2: 26 mmol/L (ref 22–32)
Calcium: 9 mg/dL (ref 8.9–10.3)
Chloride: 101 mmol/L (ref 98–111)
Creatinine, Ser: 0.83 mg/dL (ref 0.61–1.24)
GFR, Estimated: >60 mL/min (ref >60)
Glucose, Bld: 104 mg/dL — ABNORMAL HIGH (ref 70–99)
Potassium: 3.8 mmol/L (ref 3.5–5.1)
Sodium: 137 mmol/L (ref 135–145)

## 2021-09-09 LAB — CK: Total CK: 68 U/L (ref 49–397)

## 2021-09-09 MED ORDER — CEFAZOLIN SODIUM-DEXTROSE 2-4 GM/100ML-% IV SOLN
2.0000 g | Freq: Three times a day (TID) | INTRAVENOUS | Status: DC
  Administered 2021-09-09 – 2021-09-13 (×12): 2 g via INTRAVENOUS
  Filled 2021-09-09 (×12): qty 100

## 2021-09-09 NOTE — Consult Note (Signed)
Date of Admission:  09/05/2021          Reason for Consult: Cellulitis   Referring Provider: Florencia Reasons, MD   Assessment:  Left lower remedy nonpurulent cellulitis + question of myofascitis but this may be an "overcall" on MRI done yesterday History of left ankle surgery with hardware Penicillin allergy  Plan:  We will narrow to cefazolin Elevate leg If he does well on this would change over to cefadroxil 2 tablets twice daily and considered completing a course of treatment of total antibiotics between 10 and 14 days given slow response of erythema to disappear and MRI questioning myositis and fasciitis though I am skeptical of the latter I will add CK to am labs Would consider amoxicillin challenge on Monday if he is still in the hospital  I will ask Dr. Linus Salmons to check in on him once over the weekend prior to change to PO antiibitcs  Active Problems:   Obesity (BMI 30-39.9)   Essential hypertension   Hyperlipidemia   Hypothyroidism   Cellulitis   Hyponatremia   Scheduled Meds:  enoxaparin (LOVENOX) injection  60 mg Subcutaneous Q24H   guaiFENesin  600 mg Oral BID   levothyroxine  175 mcg Oral QAC breakfast   loratadine  10 mg Oral Daily   pantoprazole  40 mg Oral Daily   rosuvastatin  5 mg Oral QHS   senna-docusate  1 tablet Oral QHS   Continuous Infusions:   ceFAZolin (ANCEF) IV     PRN Meds:.acetaminophen **OR** acetaminophen, fluticasone, morphine injection, ondansetron **OR** ondansetron (ZOFRAN) IV, oxyCODONE, senna-docusate, traZODone  HPI: 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  [facility] was found on exam to have left lower extremity cellulitis.  He was started on vancomycin and ceftriaxone which are certainly more broad antibiotics than he would need given this is nonpurulent cellulitic presentation.  He has improved on vancomycin and ceftriaxone and we have been consulted to make sure that he is on appropriate antibiotics.  Given that there is no purulence whatsoever I feel comfortable changing him over to cefazolin.  He has an MRI that was performed yesterday which showed no evidence of osteomyelitis or abscess but suggested possibility of myofasciitis.  Skeptical he has mild fasciitis but I will add a CPK to his morning labs.  I spent 83  minutes with the patient including than 50% of the time in face to face counseling of the patient the nature of cellulitis potential recurrent cellulitis as he is experienced now for his first time purulent versus nonpurulent cellulitis personally reviewing MRI of the left lower extremity along with review of medical records in preparation for the visit and during the visit and in coordination of his care.   I asked my partner Dr. Linus Salmons check on him prior to switch to oral therapy.     Review of Systems: Review of Systems  Constitutional:  Positive for fever. Negative for chills, malaise/fatigue and weight loss.  HENT:  Negative for congestion and sore throat.   Eyes:  Negative for blurred vision and photophobia.  Respiratory:  Negative for cough, shortness of breath and wheezing.  Cardiovascular:  Negative for chest pain, palpitations and leg swelling.  Gastrointestinal:  Negative for abdominal pain, blood in stool, constipation, diarrhea, heartburn, melena, nausea and vomiting.  Genitourinary:  Negative for dysuria, flank pain and hematuria.  Musculoskeletal:  Positive for joint pain and myalgias. Negative for back pain and falls.  Skin:  Negative for itching and rash.  Neurological:  Negative for dizziness, focal  weakness, loss of consciousness, weakness and headaches.  Endo/Heme/Allergies:  Does not bruise/bleed easily.  Psychiatric/Behavioral:  Negative for depression and suicidal ideas. The patient does not have insomnia.     Past Medical History:  Diagnosis Date   ALLERGIC RHINITIS    Cancer ([place])    Melonoma back   CELLULITIS, LEG, RIGHT    Recurrent R hip cellulitis 1/08,3/08    Diverticulitis    GERD (gastroesophageal reflux disease)    Gout    Hashimoto's thyroiditis    Hypertension    Hypogonadism male    low T, a/w ED   Hypothyroid    goiters    Left thyroid nodule 2008   on [place], decrease size in 2011 [place]   Migraines    Atypical and ocular   OSTEOARTHRITIS    Seasonal allergies    Sleep apnea    cpap    Social History   Tobacco Use   Smoking status: Never   Smokeless tobacco: Never  Vaping Use   Vaping Use: Never used  Substance Use Topics   Alcohol use: Yes    Alcohol/week: 0.0 standard drinks of alcohol    Comment: rare   Drug use: No    Family History  Problem Relation Age of Onset   Arthritis Father    Heart disease Father    Diabetes Father    Vascular Disease Father        AoBifem   Arthritis Mother    Diabetes Mother    Multiple sclerosis Mother    Cancer Paternal Grandmother        "GI"   Cancer Paternal Grandfather        stomach cancer   Thyroid cancer Sister    Uterine cancer Sister    Allergies  Allergen Reactions   Nsaids Shortness Of Breath    Naproxen specifically causes SOB/wheeze tolerates topical diclofenac without problem Tylenol OK   Tolmetin Shortness Of Breath    Naproxen specifically causes SOB/wheeze tolerates topical diclofenac without problem Tylenol OK   Penicillins Rash    Did it involve swelling of the face/tongue/throat, SOB, or low BP? No Did it involve sudden or severe rash/hives, skin peeling, or any reaction on the inside of your mouth or nose? No Did you need to seek medical attention at a hospital or  doctor's office? No When did it last happen?       If all above answers are "NO", may proceed with cephalosporin use.     OBJECTIVE: Blood pressure 134/74, pulse 65, temperature 98.9 F (37.2 C), temperature source Oral, resp. rate 18, height 5' 10.5" (1.791 m), weight 121.4 kg, SpO2 95 %.  Physical Exam Constitutional:      Appearance: He is well-developed.  HENT:     Head: Normocephalic and atraumatic.  Eyes:     Conjunctiva/sclera: Conjunctivae normal.  Cardiovascular:     Rate and Rhythm: Normal rate and regular rhythm.  Pulmonary:     Effort: Pulmonary effort is normal. No respiratory distress.     Breath sounds: No wheezing.  Abdominal:     General:  There is no distension.     Palpations: Abdomen is soft.  Musculoskeletal:        General: No tenderness. Normal range of motion.     Cervical back: Normal range of motion and neck supple.  Skin:    General: Skin is warm and dry.     Coloration: Skin is not pale.     Findings: Erythema present. No rash.  Neurological:     General: No focal deficit present.     Mental Status: He is alert and oriented to person, place, and time.  Psychiatric:        Mood and Affect: Mood normal.        Behavior: Behavior normal.        Thought Content: Thought content normal.        Judgment: Judgment normal.    LLE cellulitis 09/09/2021:     Lab Results Lab Results  Component Value Date   WBC 9.6 09/07/2021   HGB 13.0 09/07/2021   HCT 38.3 (L) 09/07/2021   MCV 91.8 09/07/2021   PLT 269 09/07/2021    Lab Results  Component Value Date   CREATININE 0.83 09/09/2021   BUN 7 (L) 09/09/2021   NA 137 09/09/2021   K 3.8 09/09/2021   CL 101 09/09/2021   CO2 26 09/09/2021    Lab Results  Component Value Date   ALT 22 09/06/2021   AST 24 09/06/2021   ALKPHOS 42 09/06/2021   BILITOT 0.8 09/06/2021     Microbiology: Recent Results (from the past 240 hour(s))  Blood culture (routine x 2)     Status: None (Preliminary result)    Collection Time: 09/05/21  1:44 PM   Specimen: BLOOD  Result Value Ref Range Status   Specimen Description   Final    BLOOD LEFT ANTECUBITAL Performed at [facility], [place] [address] [place], [place] [address]    Special Requests   Final    BOTTLES DRAWN AEROBIC AND ANAEROBIC Blood Culture adequate volume Performed at [place] [address] [place], [place] [address]    Culture   Final    NO GROWTH 4 DAYS Performed at [facility], [place] [address], [place], [place] [address]    Report Status PENDING  Incomplete  Blood culture (routine x 2)     Status: None (Preliminary result)   Collection Time: 09/05/21  4:20 PM   Specimen: BLOOD  Result Value Ref Range Status   Specimen Description   Final    BLOOD BLOOD RIGHT FOREARM Performed at [place] [address], [place], [place] [address]    Special Requests   Final    BOTTLES DRAWN AEROBIC AND ANAEROBIC Blood Culture adequate volume Performed at [place] [address] [place], [place] [address]    Culture   Final    NO GROWTH 4 DAYS Performed at [facility], [place] [address], [place], [place] [address]    Report Status PENDING  Incomplete  MRSA Next Gen by PCR, Nasal     Status: None   Collection Time: 09/07/21 10:41 AM   Specimen: Nasal Mucosa; Nasal Swab  Result Value Ref Range Status   MRSA by PCR Next Gen NOT DETECTED NOT DETECTED Final    Comment: (NOTE) The GeneXpert MRSA Assay (FDA approved for NASAL specimens only), is one component of a comprehensive MRSA colonization surveillance program. It is not intended to diagnose MRSA infection  nor to guide or monitor treatment for MRSA infections. Test performance is not FDA approved in patients less than 4 years old. Performed at [facility], [place] [address], [place], [place] [address]   SARS Coronavirus 2 by RT PCR (hospital order, performed in  [facility] hospital lab) *cepheid single result test* Anterior Nasal Swab     Status: None   Collection Time: 09/08/21 11:25 AM   Specimen: Anterior Nasal Swab  Result Value Ref Range Status   SARS Coronavirus 2 by RT PCR NEGATIVE NEGATIVE Final    Comment: (NOTE) SARS-CoV-2 target nucleic acids are NOT DETECTED.  The SARS-CoV-2 RNA is generally detectable in upper and lower respiratory specimens during the acute phase of infection. The lowest concentration of SARS-CoV-2 viral copies this assay can detect is 250 copies / mL. A negative result does not preclude SARS-CoV-2 infection and should not be used as the sole basis for treatment or other patient management decisions.  A negative result may occur with improper specimen collection / handling, submission of specimen other than nasopharyngeal swab, presence of viral mutation(s) within the areas targeted by this assay, and inadequate number of viral copies (<250 copies / mL). A negative result must be combined with clinical observations, patient history, and epidemiological information.  Fact Sheet for Patients:   https://www.patel.info/  Fact Sheet for Healthcare Providers: https://hall.com/  This test is not yet approved or  cleared by the [place] FDA and has been authorized for detection and/or diagnosis of SARS-CoV-2 by FDA under an Emergency Use Authorization (EUA).  This EUA will remain in effect (meaning this test can be used) for the duration of the COVID-19 declaration under Section 564(b)(1) of the Act, 21 U.S.C. section 360bbb-3(b)(1), unless the authorization is terminated or revoked sooner.  Performed at [facility], [place] [address] [place], [place] [address]   Expectorated Sputum Assessment w Gram Stain, Rflx to Resp Cult     Status: None   Collection Time: 09/09/21  8:45 AM   Specimen: Expectorated Sputum  Result Value Ref Range Status    Specimen Description EXPECTORATED SPUTUM  Final   Special Requests NONE  Final   Sputum evaluation   Final    Sputum specimen not acceptable for testing.  Please recollect.   Performed at [facility], [place] [address], [place], [place] [address]    Report Status 09/09/2021 FINAL  Final    Alcide Evener, [place] [facility] [phone] pager  09/09/2021, 3:46 PM

## 2021-09-09 NOTE — Progress Notes (Signed)
Pharmacy Antibiotic Note  Jonathan Powers is a 71 y.o. male who presented to the ED on 09/05/2021 with c/o LLE pain redness and swelling. ID consulted and narrowing antibiotics to Cefazolin for cellulitis. Pharmacy consulted for dosing.   Plan: Cefazolin 2g IV q8h Monitor renal function, cultures, clinical course, further ID recommendations    Height: 5' 10.5" (179.1 cm) Weight: 121.4 kg (267 lb 10.2 oz) IBW/kg (Calculated) : 74.15  Temp (24hrs), Avg:98.7 F (37.1 C), Min:98.4 F (36.9 C), Max:98.9 F (37.2 C)  Recent Labs  Lab 09/05/21 1456 09/05/21 1958 09/06/21 0330 09/07/21 0304 09/08/21 0330 09/09/21 0320  WBC 10.9* 9.3 9.9 9.6  --   --   CREATININE 1.22 0.88 0.90 0.87 0.76 0.83     Estimated Creatinine Clearance: 110.6 mL/min (by C-G formula based on SCr of 0.83 mg/dL).    Allergies  Allergen Reactions   Nsaids Shortness Of Breath    Naproxen specifically causes SOB/wheeze tolerates topical diclofenac without problem Tylenol OK   Tolmetin Shortness Of Breath    Naproxen specifically causes SOB/wheeze tolerates topical diclofenac without problem Tylenol OK   Penicillins Rash    Did it involve swelling of the face/tongue/throat, SOB, or low BP? No Did it involve sudden or severe rash/hives, skin peeling, or any reaction on the inside of your mouth or nose? No Did you need to seek medical attention at a hospital or doctor's office? No When did it last happen?       If all above answers are "NO", may proceed with cephalosporin use.     Thank you for allowing pharmacy to be a part of this patient's care.   Lindell Spar, PharmD, BCPS Clinical Pharmacist  09/09/2021 3:18 PM

## 2021-09-09 NOTE — Care Management Important Message (Signed)
Important Message  Patient Details IM Letter given. Name: Jonathan Powers MRN: [id number] Date of Birth: 09-17-1950   Medicare Important Message Given:  Yes     Kerin Salen 09/09/2021, 11:36 AM

## 2021-09-09 NOTE — Progress Notes (Signed)
Pharmacy Antibiotic Note  Jonathan Powers is a 71 y.o. male who presented to the ED on 09/05/2021 with c/o LLE pain redness and swelling.  He's currently on ceftriaxone and vancomycin for cellulitis.  Today, 09/09/2021: D5 abx Remains afebrile, WBC stable WNL SCr stable WNL Concern for possible PNA; cellulitis sounds nonpurulent per MD; ID to see for potential narrowing MRSA PCR negative  Plan: Consider narrowing antibiotics to Ancef if nonpurulent cellulitis; could continue Rocephin if found to have PNA.   Until further assessment by ID: Continue vancomycin 1000 mg IV q12h for est AUC 479 Continue ceftriaxone 2gm IV q24h per MD  _____________________________________________  Height: 5' 10.5" (179.1 cm) Weight: 121.4 kg (267 lb 10.2 oz) IBW/kg (Calculated) : 74.15  Temp (24hrs), Avg:98.7 F (37.1 C), Min:98.4 F (36.9 C), Max:98.9 F (37.2 C)  Recent Labs  Lab 09/05/21 1456 09/05/21 1958 09/06/21 0330 09/07/21 0304 09/08/21 0330 09/09/21 0320  WBC 10.9* 9.3 9.9 9.6  --   --   CREATININE 1.22 0.88 0.90 0.87 0.76 0.83     Estimated Creatinine Clearance: 110.6 mL/min (by C-G formula based on SCr of 0.83 mg/dL).    Allergies  Allergen Reactions   Nsaids Shortness Of Breath    Naproxen specifically causes SOB/wheeze tolerates topical diclofenac without problem Tylenol OK   Tolmetin Shortness Of Breath    Naproxen specifically causes SOB/wheeze tolerates topical diclofenac without problem Tylenol OK   Penicillins Rash    Did it involve swelling of the face/tongue/throat, SOB, or low BP? No Did it involve sudden or severe rash/hives, skin peeling, or any reaction on the inside of your mouth or nose? No Did you need to seek medical attention at a hospital or doctor's office? No When did it last happen?       If all above answers are "NO", may proceed with cephalosporin use.     Thank you for allowing pharmacy to be a part of this patient's care.  Emanual Lamountain  A 09/09/2021 2:45 PM

## 2021-09-09 NOTE — Progress Notes (Signed)
PROGRESS NOTE    Jonathan Powers  QIH:[id number] DOB: September 03, 1950 DOA: 09/05/2021 PCP: [name], MD   Brief Narrative:   71 y.o. male with medical history significant of hypertension, hypothyroidism, obstructive sleep apnea on CPAP presented with progressively worsening left lower extremity swelling and redness.  On the presentation, he was found to have left lower extremity cellulitis and started on broad-spectrum antibiotics.  Lower extremity duplex was negative for DVT.  Assessment & Plan:   Left lower extremity cellulitis - Duplex ultrasound negative for DVT. -negative x ray - MRI" 1. Diffuse cellulitis without discrete subcutaneous abscess. 2. Mild changes of myofasciitis but no findings for pyomyositis. 3. No findings for septic arthritis or osteomyelitis." -Does not appear septic, blood culture no growth but persistent significant left lower extremity erythema/edema from almost down from the knee to the ankle after several days of Rocephin and vancomycin, will consult ID   Hyponatremia -Resolved  Hypokalemia -Replaced and improved   Hypomagnesemia -Replaced, improved  Hypothyroidism -Continue levothyroxine  Hypertension -Monitor blood pressure.  Blood pressure on the lower side.  Hold antihypertensives for now.  Cough, bronchitis? Lung sound clear Negative  covid screening, cxr no acute findings, already on abx, continue mucinex sputum culture if able to obtain   Body mass index is 37.86 kg/m.  Meet obesity criteria He is motivated to lose weight by diet and exercise  Reports stopped using cpap after loosing weight  DVT prophylaxis: Lovenox Code Status: Full Family Communication: None at bedside Disposition Plan: Not ready to discharge ,continue Need IV antibiotics  Consultants: ID  Procedures: None  Antimicrobials: Rocephin and vancomycin from 09/05/2021 onwards  Anti-infectives (From admission, onward)    Start     Dose/Rate Route Frequency  Ordered Stop   09/09/21 1600  ceFAZolin (ANCEF) IVPB 2g/100 mL premix        2 g 200 mL/hr over 30 Minutes Intravenous Every 8 hours 09/09/21 1521     09/06/21 1800  vancomycin (VANCOREADY) IVPB 1500 mg/300 mL  Status:  Discontinued        1,500 mg 150 mL/hr over 120 Minutes Intravenous Every 24 hours 09/05/21 1721 09/06/21 1056   09/06/21 1600  cefTRIAXone (ROCEPHIN) 2 g in sodium chloride 0.9 % 100 mL IVPB  Status:  Discontinued        2 g 200 mL/hr over 30 Minutes Intravenous Every 24 hours 09/05/21 1706 09/09/21 1515   09/06/21 1200  vancomycin (VANCOCIN) IVPB 1000 mg/200 mL premix  Status:  Discontinued        1,000 mg 200 mL/hr over 60 Minutes Intravenous Every 12 hours 09/06/21 1057 09/09/21 1515   09/05/21 1730  vancomycin (VANCOREADY) IVPB 2000 mg/400 mL        2,000 mg 200 mL/hr over 120 Minutes Intravenous  Once 09/05/21 1720 09/05/21 2057   09/05/21 1415  cefTRIAXone (ROCEPHIN) 2 g in sodium chloride 0.9 % 100 mL IVPB        2 g 200 mL/hr over 30 Minutes Intravenous  Once 09/05/21 1406 09/05/21 1655   09/05/21 1345  cefTRIAXone (ROCEPHIN) injection 2 g  Status:  Discontinued        2 g Intramuscular  Once 09/05/21 1344 09/05/21 1406       Subjective:   Had significant  cough with purlent /yellowish white sputum on 7/20, cough appears has much improved today  Left leg remains significantly edematous , erythema , report more pain , has good pulse  Afebrile last 24 hours Wbc  normal   Objective: Vitals:   09/08/21 2330 09/09/21 0500 09/09/21 0636 09/09/21 1339  BP: (!) 123/54  112/75 134/74  Pulse: 65  (!) 58 65  Resp: '16  17 18  '$ Temp: 98.4 F (36.9 C)  98.9 F (37.2 C) 98.9 F (37.2 C)  TempSrc: Oral  Oral Oral  SpO2: 94%  94% 95%  Weight:  121.4 kg    Height:        Intake/Output Summary (Last 24 hours) at 09/09/2021 1551 Last data filed at 09/09/2021 0600 Gross per 24 hour  Intake 451.21 ml  Output --  Net 451.21 ml   Filed Weights   09/05/21 2206  09/06/21 0423 09/09/21 0500  Weight: 118.2 kg 118.2 kg 121.4 kg    Examination:  General: On room air.  No distress ENT/neck: No thyromegaly.  JVD is not elevated  respiratory: Decreased breath sounds at bases bilaterally with some crackles; no wheezing  CVS: S1-S2 heard, rate controlled currently Abdominal: Soft, obese, nontender, slightly distended; no organomegaly, normal bowel sounds are heard Extremities: Trace lower extremity edema; no cyanosis  CNS: Awake and alert.  No focal neurologic deficit.  Moves extremities Lymph: No obvious lymphadenopathy Skin:Left lower extremity is swollen/erythematous/mildly tender FROM down the knee up to the ankle; slightly improved compared to yesterday.  No ulcers, wounds noted.   psych: Affect, judgment and mood are normal  musculoskeletal: No obvious other joint swelling/deformity      Data Reviewed: I have personally reviewed following labs and imaging studies  CBC: Recent Labs  Lab 09/05/21 1456 09/05/21 1958 09/06/21 0330 09/07/21 0304  WBC 10.9* 9.3 9.9 9.6  NEUTROABS 8.5*  --   --  6.7  HGB 14.6 13.5 13.0 13.0  HCT 43.6 40.0 38.5* 38.3*  MCV 92.0 91.5 92.1 91.8  PLT 262 217 230 825   Basic Metabolic Panel: Recent Labs  Lab 09/05/21 1456 09/05/21 1958 09/06/21 0330 09/07/21 0304 09/08/21 0330 09/09/21 0320  NA 134*  --  138 140 139 137  K 3.5  --  3.3* 3.4* 3.1* 3.8  CL 96*  --  104 104 104 101  CO2 29  --  '25 26 28 26  '$ GLUCOSE 124*  --  106* 125* 119* 104*  BUN 17  --  13 8 7* 7*  CREATININE 1.22 0.88 0.90 0.87 0.76 0.83  CALCIUM 9.1  --  8.6* 8.8* 8.6* 9.0  MG  --   --  1.6* 2.1 1.9  --    GFR: Estimated Creatinine Clearance: 110.6 mL/min (by C-G formula based on SCr of 0.83 mg/dL). Liver Function Tests: Recent Labs  Lab 09/05/21 1456 09/06/21 0330  AST 23 24  ALT 25 22  ALKPHOS 47 42  BILITOT 0.9 0.8  PROT 7.5 6.3*  ALBUMIN 3.8 3.2*   No results for input(s): "LIPASE", "AMYLASE" in the last 168  hours. No results for input(s): "AMMONIA" in the last 168 hours. Coagulation Profile: No results for input(s): "INR", "PROTIME" in the last 168 hours. Cardiac Enzymes: Recent Labs  Lab 09/09/21 0320  CKTOTAL 68   BNP (last 3 results) Recent Labs    03/11/21 1021  PROBNP 54   HbA1C: Recent Labs    09/08/21 0330  HGBA1C 5.3   CBG: No results for input(s): "GLUCAP" in the last 168 hours. Lipid Profile: No results for input(s): "CHOL", "HDL", "LDLCALC", "TRIG", "CHOLHDL", "LDLDIRECT" in the last 72 hours. Thyroid Function Tests: No results for input(s): "TSH", "T4TOTAL", "FREET4", "T3FREE", "THYROIDAB" in  the last 72 hours. Anemia Panel: No results for input(s): "VITAMINB12", "FOLATE", "FERRITIN", "TIBC", "IRON", "RETICCTPCT" in the last 72 hours. Sepsis Labs: No results for input(s): "PROCALCITON", "LATICACIDVEN" in the last 168 hours.  Recent Results (from the past 240 hour(s))  Blood culture (routine x 2)     Status: None (Preliminary result)   Collection Time: 09/05/21  1:44 PM   Specimen: BLOOD  Result Value Ref Range Status   Specimen Description   Final    BLOOD LEFT ANTECUBITAL Performed at [facility] [address], [place], [place] [address]    Special Requests   Final    BOTTLES DRAWN AEROBIC AND ANAEROBIC Blood Culture adequate volume Performed at [facility] [address] [place], [place] [address]    Culture   Final    NO GROWTH 4 DAYS Performed at [facility], [place] [address] [place], [place] [address]    Report Status PENDING  Incomplete  Blood culture (routine x 2)     Status: None (Preliminary result)   Collection Time: 09/05/21  4:20 PM   Specimen: BLOOD  Result Value Ref Range Status   Specimen Description   Final    BLOOD BLOOD RIGHT FOREARM Performed at [facility] [address] [place], [place] [address]    Special Requests   Final    BOTTLES DRAWN AEROBIC AND  ANAEROBIC Blood Culture adequate volume Performed at [facility] [address] [place], [place] [address]    Culture   Final    NO GROWTH 4 DAYS Performed at [facility], [place] [address], [place], [place] [address]    Report Status PENDING  Incomplete  MRSA Next Gen by PCR, Nasal     Status: None   Collection Time: 09/07/21 10:41 AM   Specimen: Nasal Mucosa; Nasal Swab  Result Value Ref Range Status   MRSA by PCR Next Gen NOT DETECTED NOT DETECTED Final    Comment: (NOTE) The GeneXpert MRSA Assay (FDA approved for NASAL specimens only), is one component of a comprehensive MRSA colonization surveillance program. It is not intended to diagnose MRSA infection nor to guide or monitor treatment for MRSA infections. Test performance is not FDA approved in patients less than 61 years old. Performed at [facility], [place] [address], [place],  [address]   SARS Coronavirus 2 by RT PCR (hospital order, performed in [facility] hospital lab) *cepheid single result test* Anterior Nasal Swab     Status: None   Collection Time: 09/08/21 11:25 AM   Specimen: Anterior Nasal Swab  Result Value Ref Range Status   SARS Coronavirus 2 by RT PCR NEGATIVE NEGATIVE Final    Comment: (NOTE) SARS-CoV-2 target nucleic acids are NOT DETECTED.  The SARS-CoV-2 RNA is generally detectable in upper and lower respiratory specimens during the acute phase of infection. The lowest concentration of SARS-CoV-2 viral copies this assay can detect is 250 copies / mL. A negative result does not preclude SARS-CoV-2 infection and should not be used as the sole basis for treatment or other patient management decisions.  A negative result may occur with improper specimen collection / handling, submission of specimen other than nasopharyngeal swab, presence of viral mutation(s) within the areas targeted by this assay, and inadequate number of viral copies (<250 copies /  mL). A negative result must be combined with clinical observations, patient history, and epidemiological information.  Fact Sheet for Patients:  https://www.patel.info/  Fact Sheet for Healthcare Providers: https://hall.com/  This test is not yet approved or  cleared by the [place] FDA and has been authorized for detection and/or diagnosis of SARS-CoV-2 by FDA under an Emergency Use Authorization (EUA).  This EUA will remain in effect (meaning this test can be used) for the duration of the COVID-19 declaration under Section 564(b)(1) of the Act, 21 U.S.C. section 360bbb-3(b)(1), unless the authorization is terminated or revoked sooner.  Performed at [facility], [place] [address], [place], [place] [address]   Expectorated Sputum Assessment w Gram Stain, Rflx to Resp Cult     Status: None   Collection Time: 09/09/21  8:45 AM   Specimen: Expectorated Sputum  Result Value Ref Range Status   Specimen Description EXPECTORATED SPUTUM  Final   Special Requests NONE  Final   Sputum evaluation   Final    Sputum specimen not acceptable for testing.  Please recollect.   Performed at [facility], [place] [address], [place], [place] [address]    Report Status 09/09/2021 FINAL  Final         Radiology Studies: MR TIBIA FIBULA LEFT W WO CONTRAST  Result Date: 09/08/2021 CLINICAL DATA:  Diffuse lower extremity swelling and redness. EXAM: MRI OF LOWER LEFT EXTREMITY WITHOUT AND WITH CONTRAST TECHNIQUE: Multiplanar, multisequence MR imaging of the left lower extremity was performed both before and after administration of intravenous contrast. CONTRAST:  44m GADAVIST GADOBUTROL 1 MMOL/ML IV SOLN COMPARISON:  None Available. FINDINGS: Diffuse and marked subcutaneous soft tissue swelling/edema/fluid and skin thickening consistent with severe cellulitis. No focal rim enhancing fluid collection to suggest a drainable  abscess. There are also mild changes of myofasciitis most notably involving the interosseous membrane and posterior tibialis muscle but no findings for pyomyositis. Small knee joint effusion but no findings suspicious for septic arthritis at the knee or ankle joint. No findings for osteomyelitis. IMPRESSION: 1. Diffuse cellulitis without discrete subcutaneous abscess. 2. Mild changes of myofasciitis but no findings for pyomyositis. 3. No findings for septic arthritis or osteomyelitis. Electronically Signed   By: PMarijo SanesM.D.   On: 09/08/2021 19:05   DG Tibia/Fibula Left  Result Date: 09/08/2021 CLINICAL DATA:  Pain and swelling EXAM: LEFT TIBIA AND FIBULA - 2 VIEW COMPARISON:  None Available. FINDINGS: No recent fracture or dislocation is seen. There are no focal lytic lesions. There is previous surgical fusion of multiple tarsals in the posterior aspect of the ankle. Degenerative changes are noted in left knee with bony spurs, more so in the lateral compartment. There is diffuse edema in subcutaneous plane. There is soft tissue swelling around the ankle, more so over the lateral malleolus. IMPRESSION: No recent fracture or dislocation is seen. There are no focal lytic lesions. Degenerative changes are noted in left knee, mostly in the lateral compartment. Electronically Signed   By: PElmer PickerM.D.   On: 09/08/2021 13:33   DG Chest 2 View  Result Date: 09/08/2021 CLINICAL DATA:  Cough, shortness of breath, no chest pain EXAM: CHEST - 2 VIEW COMPARISON:  06/25/2021 FINDINGS: The heart size and mediastinal contours are within normal limits. Both lungs are clear. The visualized skeletal structures are unremarkable. IMPRESSION: No active cardiopulmonary disease. Electronically Signed   By: HKathreen DevoidM.D.   On: 09/08/2021 13:32        Scheduled Meds:  enoxaparin (LOVENOX) injection  60 mg Subcutaneous Q24H   guaiFENesin  600 mg Oral BID  levothyroxine  175 mcg Oral QAC breakfast    loratadine  10 mg Oral Daily   pantoprazole  40 mg Oral Daily   rosuvastatin  5 mg Oral QHS   senna-docusate  1 tablet Oral QHS   Continuous Infusions:   ceFAZolin (ANCEF) IV 2 g (09/09/21 1546)          Florencia Reasons, MD PhD FACP [facility] 09/09/2021, 3:51 PM

## 2021-09-09 NOTE — Plan of Care (Signed)
  Problem: Pain Managment: Goal: General experience of comfort will improve Outcome: Progressing   

## 2021-09-10 DIAGNOSIS — L03115 Cellulitis of right lower limb: Secondary | ICD-10-CM | POA: Diagnosis not present

## 2021-09-10 DIAGNOSIS — L03116 Cellulitis of left lower limb: Secondary | ICD-10-CM | POA: Diagnosis not present

## 2021-09-10 LAB — CBC WITH DIFFERENTIAL/PLATELET
Abs Immature Granulocytes: 0.04 10*3/uL (ref 0.00–0.07)
Basophils Absolute: 0 10*3/uL (ref 0.0–0.1)
Basophils Relative: 1 %
Eosinophils Absolute: 0.3 10*3/uL (ref 0.0–0.5)
Eosinophils Relative: 4 %
HCT: 39.4 % (ref 39.0–52.0)
Hemoglobin: 13.1 g/dL (ref 13.0–17.0)
Immature Granulocytes: 1 %
Lymphocytes Relative: 26 %
Lymphs Abs: 1.9 10*3/uL (ref 0.7–4.0)
MCH: 30.3 pg (ref 26.0–34.0)
MCHC: 33.2 g/dL (ref 30.0–36.0)
MCV: 91.2 fL (ref 80.0–100.0)
Monocytes Absolute: 0.9 10*3/uL (ref 0.1–1.0)
Monocytes Relative: 11 %
Neutro Abs: 4.3 10*3/uL (ref 1.7–7.7)
Neutrophils Relative %: 57 %
Platelets: 403 10*3/uL — ABNORMAL HIGH (ref 150–400)
RBC: 4.32 MIL/uL (ref 4.22–5.81)
RDW: 12.6 % (ref 11.5–15.5)
WBC: 7.4 10*3/uL (ref 4.0–10.5)
nRBC: 0 % (ref 0.0–0.2)

## 2021-09-10 LAB — BASIC METABOLIC PANEL
Anion gap: 9 (ref 5–15)
BUN: 11 mg/dL (ref 8–23)
CO2: 27 mmol/L (ref 22–32)
Calcium: 8.9 mg/dL (ref 8.9–10.3)
Chloride: 105 mmol/L (ref 98–111)
Creatinine, Ser: 0.93 mg/dL (ref 0.61–1.24)
GFR, Estimated: >60 mL/min (ref >60)
Glucose, Bld: 107 mg/dL — ABNORMAL HIGH (ref 70–99)
Potassium: 3.5 mmol/L (ref 3.5–5.1)
Sodium: 141 mmol/L (ref 135–145)

## 2021-09-10 LAB — CK: Total CK: 39 U/L — ABNORMAL LOW (ref 49–397)

## 2021-09-10 LAB — CULTURE, BLOOD (ROUTINE X 2)
Culture: NO GROWTH
Culture: NO GROWTH
Special Requests: ADEQUATE
Special Requests: ADEQUATE

## 2021-09-10 LAB — MAGNESIUM: Magnesium: 1.9 mg/dL (ref 1.7–2.4)

## 2021-09-10 NOTE — Progress Notes (Signed)
PROGRESS NOTE    Jonathan Powers  WIO:[id number] DOB: 12/04/1950 DOA: 09/05/2021 PCP: [name], MD   Brief Narrative:   71 y.o. male with medical history significant of hypertension, hypothyroidism, obstructive sleep apnea on CPAP presented with progressively worsening left lower extremity swelling and redness.  On the presentation, he was found to have left lower extremity cellulitis and started on broad-spectrum antibiotics.  Lower extremity duplex was negative for DVT.  Assessment & Plan:   Left lower extremity cellulitis - Duplex ultrasound negative for DVT. -negative x ray - MRI" 1. Diffuse cellulitis without discrete subcutaneous abscess. 2. Mild changes of myofasciitis but no findings for pyomyositis. 3. No findings for septic arthritis or osteomyelitis." -Does not appear septic, blood culture no growth but persistent significant left lower extremity erythema/edema from almost down from the knee to the ankle after several days of Rocephin and vancomycin -ID consulted , abx adjustment per ID, currently on ancef.   Hyponatremia -Resolved  Hypokalemia -Replaced and improved   Hypomagnesemia -Replaced, improved  Hypothyroidism -Continue levothyroxine  Hypertension -Monitor blood pressure.  Blood pressure on the lower side.  Hold antihypertensives for now.  Cough, bronchitis? Had significant  cough with purlent /yellowish white sputum on 7/20 Lung sound clear Negative  covid screening, cxr no acute findings, already on abx, continue mucinex sputum culture if able to obtain Cough appear has resolved    Body mass index is 37.58 kg/m.  Meet obesity criteria He is motivated to lose weight by diet and exercise  Reports stopped using cpap after loosing weight  DVT prophylaxis: Lovenox Code Status: Full Family Communication: None at bedside Disposition Plan:  currently on IV antibiotics  Consultants: ID  Procedures: None  Antimicrobials: Rocephin and  vancomycin from 09/05/2021 to 7/21, then ancef  Anti-infectives (From admission, onward)    Start     Dose/Rate Route Frequency Ordered Stop   09/09/21 1600  ceFAZolin (ANCEF) IVPB 2g/100 mL premix        2 g 200 mL/hr over 30 Minutes Intravenous Every 8 hours 09/09/21 1521     09/06/21 1800  vancomycin (VANCOREADY) IVPB 1500 mg/300 mL  Status:  Discontinued        1,500 mg 150 mL/hr over 120 Minutes Intravenous Every 24 hours 09/05/21 1721 09/06/21 1056   09/06/21 1600  cefTRIAXone (ROCEPHIN) 2 g in sodium chloride 0.9 % 100 mL IVPB  Status:  Discontinued        2 g 200 mL/hr over 30 Minutes Intravenous Every 24 hours 09/05/21 1706 09/09/21 1515   09/06/21 1200  vancomycin (VANCOCIN) IVPB 1000 mg/200 mL premix  Status:  Discontinued        1,000 mg 200 mL/hr over 60 Minutes Intravenous Every 12 hours 09/06/21 1057 09/09/21 1515   09/05/21 1730  vancomycin (VANCOREADY) IVPB 2000 mg/400 mL        2,000 mg 200 mL/hr over 120 Minutes Intravenous  Once 09/05/21 1720 09/05/21 2057   09/05/21 1415  cefTRIAXone (ROCEPHIN) 2 g in sodium chloride 0.9 % 100 mL IVPB        2 g 200 mL/hr over 30 Minutes Intravenous  Once 09/05/21 1406 09/05/21 1655   09/05/21 1345  cefTRIAXone (ROCEPHIN) injection 2 g  Status:  Discontinued        2 g Intramuscular  Once 09/05/21 1344 09/05/21 1406       Subjective:     Left leg remains significantly edematous , erythema ,  has good pulse  Afebrile  Wbc normal   Objective: Vitals:   09/09/21 2108 09/10/21 0500 09/10/21 0640 09/10/21 1344  BP: (!) 103/52  134/78 118/71  Pulse: 65  63 (!) 53  Resp: '18  18 14  '$ Temp: 98.6 F (37 C)  98.3 F (36.8 C) 98.5 F (36.9 C)  TempSrc:    Oral  SpO2: 96%  93% 94%  Weight:  120.5 kg    Height:        Intake/Output Summary (Last 24 hours) at 09/10/2021 1711 Last data filed at 09/10/2021 1606 Gross per 24 hour  Intake 1707.03 ml  Output --  Net 1707.03 ml   Filed Weights   09/06/21 0423 09/09/21 0500  09/10/21 0500  Weight: 118.2 kg 121.4 kg 120.5 kg    Examination:  General: On room air.  No distress ENT/neck: No thyromegaly.  JVD is not elevated  respiratory: Decreased breath sounds at bases bilaterally with some crackles; no wheezing  CVS: S1-S2 heard, rate controlled currently Abdominal: Soft, obese, nontender, slightly distended; no organomegaly, normal bowel sounds are heard Extremities: Trace lower extremity edema; no cyanosis  CNS: Awake and alert.  No focal neurologic deficit.  Moves extremities Lymph: No obvious lymphadenopathy Skin:Left lower extremity is swollen/erythematous/mildly tender FROM down the knee up to the ankle; slightly improved compared to yesterday.  No ulcers, wounds noted.   psych: Affect, judgment and mood are normal  musculoskeletal: No obvious other joint swelling/deformity      Data Reviewed: I have personally reviewed following labs and imaging studies  CBC: Recent Labs  Lab 09/05/21 1456 09/05/21 1958 09/06/21 0330 09/07/21 0304 09/10/21 0325  WBC 10.9* 9.3 9.9 9.6 7.4  NEUTROABS 8.5*  --   --  6.7 4.3  HGB 14.6 13.5 13.0 13.0 13.1  HCT 43.6 40.0 38.5* 38.3* 39.4  MCV 92.0 91.5 92.1 91.8 91.2  PLT 262 217 230 269 814*   Basic Metabolic Panel: Recent Labs  Lab 09/06/21 0330 09/07/21 0304 09/08/21 0330 09/09/21 0320 09/10/21 0325  NA 138 140 139 137 141  K 3.3* 3.4* 3.1* 3.8 3.5  CL 104 104 104 101 105  CO2 '25 26 28 26 27  '$ GLUCOSE 106* 125* 119* 104* 107*  BUN 13 8 7* 7* 11  CREATININE 0.90 0.87 0.76 0.83 0.93  CALCIUM 8.6* 8.8* 8.6* 9.0 8.9  MG 1.6* 2.1 1.9  --  1.9   GFR: Estimated Creatinine Clearance: 98.3 mL/min (by C-G formula based on SCr of 0.93 mg/dL). Liver Function Tests: Recent Labs  Lab 09/05/21 1456 09/06/21 0330  AST 23 24  ALT 25 22  ALKPHOS 47 42  BILITOT 0.9 0.8  PROT 7.5 6.3*  ALBUMIN 3.8 3.2*   No results for input(s): "LIPASE", "AMYLASE" in the last 168 hours. No results for input(s):  "AMMONIA" in the last 168 hours. Coagulation Profile: No results for input(s): "INR", "PROTIME" in the last 168 hours. Cardiac Enzymes: Recent Labs  Lab 09/09/21 0320 09/10/21 0325  CKTOTAL 68 39*   BNP (last 3 results) Recent Labs    03/11/21 1021  PROBNP 54   HbA1C: Recent Labs    09/08/21 0330  HGBA1C 5.3   CBG: No results for input(s): "GLUCAP" in the last 168 hours. Lipid Profile: No results for input(s): "CHOL", "HDL", "LDLCALC", "TRIG", "CHOLHDL", "LDLDIRECT" in the last 72 hours. Thyroid Function Tests: No results for input(s): "TSH", "T4TOTAL", "FREET4", "T3FREE", "THYROIDAB" in the last 72 hours. Anemia Panel: No results for input(s): "VITAMINB12", "FOLATE", "FERRITIN", "TIBC", "IRON", "RETICCTPCT" in  the last 72 hours. Sepsis Labs: No results for input(s): "PROCALCITON", "LATICACIDVEN" in the last 168 hours.  Recent Results (from the past 240 hour(s))  Blood culture (routine x 2)     Status: None   Collection Time: 09/05/21  1:44 PM   Specimen: BLOOD  Result Value Ref Range Status   Specimen Description   Final    BLOOD LEFT ANTECUBITAL Performed at [facility] [address] [place], [place] [address]    Special Requests   Final    BOTTLES DRAWN AEROBIC AND ANAEROBIC Blood Culture adequate volume Performed at [facility] [address], [place], [place] [address]    Culture   Final    NO GROWTH 5 DAYS Performed at [facility], [place] [address], [place], [place] [address]    Report Status 09/10/2021 FINAL  Final  Blood culture (routine x 2)     Status: None   Collection Time: 09/05/21  4:20 PM   Specimen: BLOOD  Result Value Ref Range Status   Specimen Description   Final    BLOOD BLOOD RIGHT FOREARM Performed at [facility] [address] [place], [place] [address]    Special Requests   Final    BOTTLES DRAWN AEROBIC AND ANAEROBIC Blood Culture adequate volume Performed at  [facility] [address] [place], [place] [address]    Culture   Final    NO GROWTH 5 DAYS Performed at [facility], [place] [address] [place], [place] [address]    Report Status 09/10/2021 FINAL  Final  MRSA Next Gen by PCR, Nasal     Status: None   Collection Time: 09/07/21 10:41 AM   Specimen: Nasal Mucosa; Nasal Swab  Result Value Ref Range Status   MRSA by PCR Next Gen NOT DETECTED NOT DETECTED Final    Comment: (NOTE) The GeneXpert MRSA Assay (FDA approved for NASAL specimens only), is one component of a comprehensive MRSA colonization surveillance program. It is not intended to diagnose MRSA infection nor to guide or monitor treatment for MRSA infections. Test performance is not FDA approved in patients less than 34 years old. Performed at [facility], [place] [address], [place], [place] [address]   SARS Coronavirus 2 by RT PCR (hospital order, performed in [facility] hospital lab) *cepheid single result test* Anterior Nasal Swab     Status: None   Collection Time: 09/08/21 11:25 AM   Specimen: Anterior Nasal Swab  Result Value Ref Range Status   SARS Coronavirus 2 by RT PCR NEGATIVE NEGATIVE Final    Comment: (NOTE) SARS-CoV-2 target nucleic acids are NOT DETECTED.  The SARS-CoV-2 RNA is generally detectable in upper and lower respiratory specimens during the acute phase of infection. The lowest concentration of SARS-CoV-2 viral copies this assay can detect is 250 copies / mL. A negative result does not preclude SARS-CoV-2 infection and should not be used as the sole basis for treatment or other patient management decisions.  A negative result may occur with improper specimen collection / handling, submission of specimen other than nasopharyngeal swab, presence of viral mutation(s) within the areas targeted by this assay, and inadequate number of viral copies (<250 copies / mL). A negative result must be combined with  clinical observations, patient history, and epidemiological information.  Fact Sheet for Patients:   https://www.patel.info/  Fact Sheet for Healthcare Providers: https://hall.com/  This test is not yet approved or  cleared  by the [place] and has been authorized for detection and/or diagnosis of SARS-CoV-2 by FDA under an Emergency Use Authorization (EUA).  This EUA will remain in effect (meaning this test can be used) for the duration of the COVID-19 declaration under Section 564(b)(1) of the Act, 21 U.S.C. section 360bbb-3(b)(1), unless the authorization is terminated or revoked sooner.  Performed at [facility], [place] [address] [place], [place] [address]   Expectorated Sputum Assessment w Gram Stain, Rflx to Resp Cult     Status: None   Collection Time: 09/09/21  8:45 AM   Specimen: Expectorated Sputum  Result Value Ref Range Status   Specimen Description EXPECTORATED SPUTUM  Final   Special Requests NONE  Final   Sputum evaluation   Final    Sputum specimen not acceptable for testing.  Please recollect.   Performed at [facility], [place] [address] [place], [place] [address]    Report Status 09/09/2021 FINAL  Final         Radiology Studies: MR TIBIA FIBULA LEFT W WO CONTRAST  Result Date: 09/08/2021 CLINICAL DATA:  Diffuse lower extremity swelling and redness. EXAM: MRI OF LOWER LEFT EXTREMITY WITHOUT AND WITH CONTRAST TECHNIQUE: Multiplanar, multisequence MR imaging of the left lower extremity was performed both before and after administration of intravenous contrast. CONTRAST:  58m GADAVIST GADOBUTROL 1 MMOL/ML IV SOLN COMPARISON:  None Available. FINDINGS: Diffuse and marked subcutaneous soft tissue swelling/edema/fluid and skin thickening consistent with severe cellulitis. No focal rim enhancing fluid collection to suggest a drainable abscess. There are also mild changes of  myofasciitis most notably involving the interosseous membrane and posterior tibialis muscle but no findings for pyomyositis. Small knee joint effusion but no findings suspicious for septic arthritis at the knee or ankle joint. No findings for osteomyelitis. IMPRESSION: 1. Diffuse cellulitis without discrete subcutaneous abscess. 2. Mild changes of myofasciitis but no findings for pyomyositis. 3. No findings for septic arthritis or osteomyelitis. Electronically Signed   By: PMarijo SanesM.D.   On: 09/08/2021 19:05        Scheduled Meds:  enoxaparin (LOVENOX) injection  60 mg Subcutaneous Q24H   guaiFENesin  600 mg Oral BID   levothyroxine  175 mcg Oral QAC breakfast   loratadine  10 mg Oral Daily   pantoprazole  40 mg Oral Daily   rosuvastatin  5 mg Oral QHS   senna-docusate  1 tablet Oral QHS   Continuous Infusions:   ceFAZolin (ANCEF) IV 200 mL/hr at 09/10/21 1606          FFlorencia Reasons MD PhD FACP [facility] 09/10/2021, 5:11 PM

## 2021-09-10 NOTE — Plan of Care (Signed)
  Problem: Clinical Measurements: Goal: Respiratory complications will improve Outcome: Progressing   Problem: Clinical Measurements: Goal: Cardiovascular complication will be avoided Outcome: Progressing   Problem: Skin Integrity: Goal: Risk for impaired skin integrity will decrease Outcome: Progressing   Problem: Activity: Goal: Risk for activity intolerance will decrease Outcome: Progressing

## 2021-09-10 NOTE — Progress Notes (Addendum)
[facility] for Infectious Disease   Reason for visit: Follow up on cellulitis  Interval History: changed to cefazolin, WBC wnl, no fever, no chills.  He feels his leg is worse today.   Day 6 total antibiotics  Physical Exam: Constitutional:  Vitals:   09/10/21 0640 09/10/21 1344  BP: 134/78 118/71  Pulse: 63 (!) 53  Resp: 18 14  Temp: 98.3 F (36.8 C) 98.5 F (36.9 C)  SpO2: 93% 94%   patient appears in NAD Respiratory: Normal respiratory effort MS: left leg with erythema up to mid to upper shin, warm, minimal tenderness, + pitting edema  Review of Systems: Constitutional: negative for fevers and chills  Lab Results  Component Value Date   WBC 7.4 09/10/2021   HGB 13.1 09/10/2021   HCT 39.4 09/10/2021   MCV 91.2 09/10/2021   PLT 403 (H) 09/10/2021    Lab Results  Component Value Date   CREATININE 0.93 09/10/2021   BUN 11 09/10/2021   NA 141 09/10/2021   K 3.5 09/10/2021   CL 105 09/10/2021   CO2 27 09/10/2021    Lab Results  Component Value Date   ALT 22 09/06/2021   AST 24 09/06/2021   ALKPHOS 42 09/06/2021     Microbiology: Recent Results (from the past 240 hour(s))  Blood culture (routine x 2)     Status: None   Collection Time: 09/05/21  1:44 PM   Specimen: BLOOD  Result Value Ref Range Status   Specimen Description   Final    BLOOD LEFT ANTECUBITAL Performed at [facility], [place] [address] [place], [place] [address]    Special Requests   Final    BOTTLES DRAWN AEROBIC AND ANAEROBIC Blood Culture adequate volume Performed at [facility] [address] [place], [place] [address]    Culture   Final    NO GROWTH 5 DAYS Performed at [facility], [place] [address], [place], [place] [address]    Report Status 09/10/2021 FINAL  Final  Blood culture (routine x 2)     Status: None   Collection Time: 09/05/21  4:20 PM   Specimen: BLOOD  Result Value Ref Range Status   Specimen Description   Final     BLOOD BLOOD RIGHT FOREARM Performed at [facility] [address] [place], [place] [address]    Special Requests   Final    BOTTLES DRAWN AEROBIC AND ANAEROBIC Blood Culture adequate volume Performed at [facility] [address], [place], [place] [address]    Culture   Final    NO GROWTH 5 DAYS Performed at [facility], [place] [address] [place], [place] [address]    Report Status 09/10/2021 FINAL  Final  MRSA Next Gen by PCR, Nasal     Status: None   Collection Time: 09/07/21 10:41 AM   Specimen: Nasal Mucosa; Nasal Swab  Result Value Ref Range Status   MRSA by PCR Next Gen NOT DETECTED NOT DETECTED Final    Comment: (NOTE) The GeneXpert MRSA Assay (FDA approved for NASAL specimens only), is one component of a comprehensive MRSA colonization surveillance program. It is not intended to diagnose MRSA infection nor to guide or monitor treatment for MRSA infections. Test performance is not FDA approved in patients less than 50 years old. Performed at [facility], [place] [address] [place], [place] [address]   SARS Coronavirus 2 by RT PCR (hospital  order, performed in [facility] hospital lab) *cepheid single result test* Anterior Nasal Swab     Status: None   Collection Time: 09/08/21 11:25 AM   Specimen: Anterior Nasal Swab  Result Value Ref Range Status   SARS Coronavirus 2 by RT PCR NEGATIVE NEGATIVE Final    Comment: (NOTE) SARS-CoV-2 target nucleic acids are NOT DETECTED.  The SARS-CoV-2 RNA is generally detectable in upper and lower respiratory specimens during the acute phase of infection. The lowest concentration of SARS-CoV-2 viral copies this assay can detect is 250 copies / mL. A negative result does not preclude SARS-CoV-2 infection and should not be used as the sole basis for treatment or other patient management decisions.  A negative result may occur with improper specimen collection / handling,  submission of specimen other than nasopharyngeal swab, presence of viral mutation(s) within the areas targeted by this assay, and inadequate number of viral copies (<250 copies / mL). A negative result must be combined with clinical observations, patient history, and epidemiological information.  Fact Sheet for Patients:   https://www.patel.info/  Fact Sheet for Healthcare Providers: https://hall.com/  This test is not yet approved or  cleared by the [place] FDA and has been authorized for detection and/or diagnosis of SARS-CoV-2 by FDA under an Emergency Use Authorization (EUA).  This EUA will remain in effect (meaning this test can be used) for the duration of the COVID-19 declaration under Section 564(b)(1) of the Act, 21 U.S.C. section 360bbb-3(b)(1), unless the authorization is terminated or revoked sooner.  Performed at [facility], [place] [address] [place], [place] [address]   Expectorated Sputum Assessment w Gram Stain, Rflx to Resp Cult     Status: None   Collection Time: 09/09/21  8:45 AM   Specimen: Expectorated Sputum  Result Value Ref Range Status   Specimen Description EXPECTORATED SPUTUM  Final   Special Requests NONE  Final   Sputum evaluation   Final    Sputum specimen not acceptable for testing.  Please recollect.   Performed at [facility], [place] [address], [place], [place] [address]    Report Status 09/09/2021 FINAL  Final    Impression/Plan:  1. Left leg cellulitis - non-purulent and typically Strep.  He is concerned it is worse today, though looks about the same compared to the picture yesterday, though hard to compare.  On exam, he did not have his leg elevated. I have recommended more aggressive elevation of his leg to decrease the swelling which is likely why it looks a bit worse.  Continue with cefazolin.   From an ID standpoint, it is ok to switch to oral cefadroxil  1000 mg (2 x 500 mg) twice a day and discharge tomorrow.  No indication for IV therapy with no fever, no systemic signs.  Otherwise Dr Tommy Medal back on Monday.    2.  History of cellulitis - he is concerned due to having had this before.  I discussed considering treatment for onychomycosis can decrease the risk for cellulitis of the legs.  Additionally, weight loss.

## 2021-09-11 DIAGNOSIS — L03116 Cellulitis of left lower limb: Secondary | ICD-10-CM | POA: Diagnosis not present

## 2021-09-11 LAB — CBC WITH DIFFERENTIAL/PLATELET
Abs Immature Granulocytes: 0.04 10*3/uL (ref 0.00–0.07)
Basophils Absolute: 0 10*3/uL (ref 0.0–0.1)
Basophils Relative: 1 %
Eosinophils Absolute: 0.2 10*3/uL (ref 0.0–0.5)
Eosinophils Relative: 3 %
HCT: 38.9 % — ABNORMAL LOW (ref 39.0–52.0)
Hemoglobin: 12.9 g/dL — ABNORMAL LOW (ref 13.0–17.0)
Immature Granulocytes: 1 %
Lymphocytes Relative: 27 %
Lymphs Abs: 1.8 10*3/uL (ref 0.7–4.0)
MCH: 30.4 pg (ref 26.0–34.0)
MCHC: 33.2 g/dL (ref 30.0–36.0)
MCV: 91.5 fL (ref 80.0–100.0)
Monocytes Absolute: 0.8 10*3/uL (ref 0.1–1.0)
Monocytes Relative: 12 %
Neutro Abs: 3.7 10*3/uL (ref 1.7–7.7)
Neutrophils Relative %: 56 %
Platelets: 445 10*3/uL — ABNORMAL HIGH (ref 150–400)
RBC: 4.25 MIL/uL (ref 4.22–5.81)
RDW: 12.7 % (ref 11.5–15.5)
WBC: 6.5 10*3/uL (ref 4.0–10.5)
nRBC: 0 % (ref 0.0–0.2)

## 2021-09-11 LAB — C-REACTIVE PROTEIN: CRP: 4.2 mg/dL — ABNORMAL HIGH (ref <1.0)

## 2021-09-11 LAB — BASIC METABOLIC PANEL
Anion gap: 9 (ref 5–15)
BUN: 11 mg/dL (ref 8–23)
CO2: 27 mmol/L (ref 22–32)
Calcium: 8.9 mg/dL (ref 8.9–10.3)
Chloride: 105 mmol/L (ref 98–111)
Creatinine, Ser: 0.92 mg/dL (ref 0.61–1.24)
GFR, Estimated: >60 mL/min (ref >60)
Glucose, Bld: 105 mg/dL — ABNORMAL HIGH (ref 70–99)
Potassium: 3.9 mmol/L (ref 3.5–5.1)
Sodium: 141 mmol/L (ref 135–145)

## 2021-09-11 LAB — MAGNESIUM: Magnesium: 2 mg/dL (ref 1.7–2.4)

## 2021-09-11 LAB — SEDIMENTATION RATE: Sed Rate: 86 mm/hr — ABNORMAL HIGH (ref 0–16)

## 2021-09-11 NOTE — Progress Notes (Signed)
PROGRESS NOTE    Jonathan Powers  LZJ:[id number] DOB: [birth year] DOA: 09/05/2021 PCP: [name], MD   Brief Narrative:   71 y.o. male with medical history significant of hypertension, hypothyroidism, obstructive sleep apnea on CPAP presented with progressively worsening left lower extremity swelling and redness.  On the presentation, he was found to have left lower extremity cellulitis and started on broad-spectrum antibiotics.  Lower extremity duplex was negative for DVT.  Assessment & Plan:   Left lower extremity cellulitis - Duplex ultrasound negative for DVT. -negative x ray - MRI" 1. Diffuse cellulitis without discrete subcutaneous abscess. 2. Mild changes of myofasciitis but no findings for pyomyositis. 3. No findings for septic arthritis or osteomyelitis." -Does not appear septic, blood culture no growth but persistent significant left lower extremity erythema/edema from almost down from the knee to the ankle after several days of Rocephin and vancomycin -ID consulted , abx adjustment per ID, currently on ancef. Appear slightly improved    Hyponatremia -Resolved  Hypokalemia -Replaced and improved   Hypomagnesemia -Replaced, improved  Hypothyroidism -Continue levothyroxine  Hypertension -Monitor blood pressure.  Blood pressure on the lower side.  Hold antihypertensives for now.  Cough, bronchitis? Had significant  cough with purlent /yellowish white sputum on 7/20 Lung sound clear Negative  covid screening, cxr no acute findings, already on abx, continue mucinex sputum culture if able to obtain Cough appear has resolved    Body mass index is 37.58 kg/m.  Meet obesity criteria He is motivated to lose weight by diet and exercise  Reports stopped using cpap after loosing weight  DVT prophylaxis: Lovenox Code Status: Full Family Communication: None at bedside Disposition Plan:  currently on IV antibiotics, needs ID clearance, possible discharge on  monday  Consultants: ID  Procedures: None  Antimicrobials: Rocephin and vancomycin from 09/05/2021 to 7/21, then ancef    Subjective:     Left leg less  edematous , erythema ,  has good pulse  Afebrile  Wbc normal   Objective: Vitals:   09/10/21 1344 09/10/21 2146 09/11/21 0619 09/11/21 1246  BP: 118/71 139/71 133/77 134/77  Pulse: (!) 53 62 63 (!) 55  Resp: '14 18 18 18  '$ Temp: 98.5 F (36.9 C) 98.9 F (37.2 C) 99.5 F (37.5 C) 98.2 F (36.8 C)  TempSrc: Oral Oral Oral Oral  SpO2: 94% 95% 93% 96%  Weight:      Height:        Intake/Output Summary (Last 24 hours) at 09/11/2021 1841 Last data filed at 09/11/2021 1726 Gross per 24 hour  Intake 1260.14 ml  Output 300 ml  Net 960.14 ml   Filed Weights   09/06/21 0423 09/09/21 0500 09/10/21 0500  Weight: 118.2 kg 121.4 kg 120.5 kg    Examination:  General: On room air.  No distress ENT/neck: No thyromegaly.  JVD is not elevated  respiratory: Decreased breath sounds at bases bilaterally with some crackles; no wheezing  CVS: S1-S2 heard, rate controlled currently Abdominal: Soft, obese, nontender, slightly distended; no organomegaly, normal bowel sounds are heard Extremities: Trace lower extremity edema; no cyanosis  CNS: Awake and alert.  No focal neurologic deficit.  Moves extremities Lymph: No obvious lymphadenopathy Skin:Left lower extremity is swollen/erythematous/mildly tender FROM down the knee up to the ankle; slightly improved compared to yesterday.  No ulcers, wounds noted.   psych: Affect, judgment and mood are normal  musculoskeletal: No obvious other joint swelling/deformity      Data Reviewed: I have personally reviewed following  labs and imaging studies  CBC: Recent Labs  Lab 09/05/21 1456 09/05/21 1958 09/06/21 0330 09/07/21 0304 09/10/21 0325 09/11/21 0328  WBC 10.9* 9.3 9.9 9.6 7.4 6.5  NEUTROABS 8.5*  --   --  6.7 4.3 3.7  HGB 14.6 13.5 13.0 13.0 13.1 12.9*  HCT 43.6 40.0 38.5*  38.3* 39.4 38.9*  MCV 92.0 91.5 92.1 91.8 91.2 91.5  PLT 262 217 230 269 403* 174*   Basic Metabolic Panel: Recent Labs  Lab 09/06/21 0330 09/07/21 0304 09/08/21 0330 09/09/21 0320 09/10/21 0325 09/11/21 0328  NA 138 140 139 137 141 141  K 3.3* 3.4* 3.1* 3.8 3.5 3.9  CL 104 104 104 101 105 105  CO2 '25 26 28 26 27 27  '$ GLUCOSE 106* 125* 119* 104* 107* 105*  BUN 13 8 7* 7* 11 11  CREATININE 0.90 0.87 0.76 0.83 0.93 0.92  CALCIUM 8.6* 8.8* 8.6* 9.0 8.9 8.9  MG 1.6* 2.1 1.9  --  1.9 2.0   GFR: Estimated Creatinine Clearance: 99.4 mL/min (by C-G formula based on SCr of 0.92 mg/dL). Liver Function Tests: Recent Labs  Lab 09/05/21 1456 09/06/21 0330  AST 23 24  ALT 25 22  ALKPHOS 47 42  BILITOT 0.9 0.8  PROT 7.5 6.3*  ALBUMIN 3.8 3.2*   No results for input(s): "LIPASE", "AMYLASE" in the last 168 hours. No results for input(s): "AMMONIA" in the last 168 hours. Coagulation Profile: No results for input(s): "INR", "PROTIME" in the last 168 hours. Cardiac Enzymes: Recent Labs  Lab 09/09/21 0320 09/10/21 0325  CKTOTAL 68 39*   BNP (last 3 results) Recent Labs    03/11/21 1021  PROBNP 54   HbA1C: No results for input(s): "HGBA1C" in the last 72 hours.  CBG: No results for input(s): "GLUCAP" in the last 168 hours. Lipid Profile: No results for input(s): "CHOL", "HDL", "LDLCALC", "TRIG", "CHOLHDL", "LDLDIRECT" in the last 72 hours. Thyroid Function Tests: No results for input(s): "TSH", "T4TOTAL", "FREET4", "T3FREE", "THYROIDAB" in the last 72 hours. Anemia Panel: No results for input(s): "VITAMINB12", "FOLATE", "FERRITIN", "TIBC", "IRON", "RETICCTPCT" in the last 72 hours. Sepsis Labs: No results for input(s): "PROCALCITON", "LATICACIDVEN" in the last 168 hours.  Recent Results (from the past 240 hour(s))  Blood culture (routine x 2)     Status: None   Collection Time: 09/05/21  1:44 PM   Specimen: BLOOD  Result Value Ref Range Status   Specimen Description    Final    BLOOD LEFT ANTECUBITAL Performed at [facility] [address], [place], [place] [address]    Special Requests   Final    BOTTLES DRAWN AEROBIC AND ANAEROBIC Blood Culture adequate volume Performed at [facility] [address], [place], [place] [address]    Culture   Final    NO GROWTH 5 DAYS Performed at [facility], [place] [address], [place], [place] [address]    Report Status 09/10/2021 FINAL  Final  Blood culture (routine x 2)     Status: None   Collection Time: 09/05/21  4:20 PM   Specimen: BLOOD  Result Value Ref Range Status   Specimen Description   Final    BLOOD BLOOD RIGHT FOREARM Performed at [facility] [address], [place], [place] [address]    Special Requests   Final    BOTTLES DRAWN AEROBIC AND ANAEROBIC Blood Culture adequate volume Performed at [facility] [address] [place], [place]  [address]    Culture   Final    NO GROWTH 5 DAYS Performed at [facility], [place] [address], [place], [place] [address]    Report Status 09/10/2021 FINAL  Final  MRSA Next Gen by PCR, Nasal     Status: None   Collection Time: 09/07/21 10:41 AM   Specimen: Nasal Mucosa; Nasal Swab  Result Value Ref Range Status   MRSA by PCR Next Gen NOT DETECTED NOT DETECTED Final    Comment: (NOTE) The GeneXpert MRSA Assay (FDA approved for NASAL specimens only), is one component of a comprehensive MRSA colonization surveillance program. It is not intended to diagnose MRSA infection nor to guide or monitor treatment for MRSA infections. Test performance is not FDA approved in patients less than 71 years old. Performed at [facility], [place] [address], [place], [place] [address]   SARS Coronavirus 2 by RT PCR (hospital order, performed in [facility] hospital lab) *cepheid single result test* Anterior Nasal Swab     Status: None   Collection Time: 09/08/21 11:25  AM   Specimen: Anterior Nasal Swab  Result Value Ref Range Status   SARS Coronavirus 2 by RT PCR NEGATIVE NEGATIVE Final    Comment: (NOTE) SARS-CoV-2 target nucleic acids are NOT DETECTED.  The SARS-CoV-2 RNA is generally detectable in upper and lower respiratory specimens during the acute phase of infection. The lowest concentration of SARS-CoV-2 viral copies this assay can detect is 250 copies / mL. A negative result does not preclude SARS-CoV-2 infection and should not be used as the sole basis for treatment or other patient management decisions.  A negative result may occur with improper specimen collection / handling, submission of specimen other than nasopharyngeal swab, presence of viral mutation(s) within the areas targeted by this assay, and inadequate number of viral copies (<250 copies / mL). A negative result must be combined with clinical observations, patient history, and epidemiological information.  Fact Sheet for Patients:   https://www.patel.info/  Fact Sheet for Healthcare Providers: https://hall.com/  This test is not yet approved or  cleared by the [place] FDA and has been authorized for detection and/or diagnosis of SARS-CoV-2 by FDA under an Emergency Use Authorization (EUA).  This EUA will remain in effect (meaning this test can be used) for the duration of the COVID-19 declaration under Section 564(b)(1) of the Act, 21 U.S.C. section 360bbb-3(b)(1), unless the authorization is terminated or revoked sooner.  Performed at [facility], [place] [address], [place], [place] [address]   Expectorated Sputum Assessment w Gram Stain, Rflx to Resp Cult     Status: None   Collection Time: 09/09/21  8:45 AM   Specimen: Expectorated Sputum  Result Value Ref Range Status   Specimen Description EXPECTORATED SPUTUM  Final   Special Requests NONE  Final   Sputum evaluation   Final    Sputum specimen  not acceptable for testing.  Please recollect.   Performed at [facility], [place] [address] [place], [place] [address]    Report Status 09/09/2021 FINAL  Final         Radiology Studies: No results found.      Scheduled Meds:  enoxaparin (LOVENOX) injection  60 mg Subcutaneous Q24H   guaiFENesin  600 mg Oral BID   levothyroxine  175 mcg Oral QAC breakfast   loratadine  10 mg Oral Daily   pantoprazole  40 mg Oral Daily   rosuvastatin  5 mg Oral  QHS   senna-docusate  1 tablet Oral QHS   Continuous Infusions:   ceFAZolin (ANCEF) IV Stopped (09/11/21 1533)          Florencia Reasons, MD PhD FACP [facility] 09/11/2021, 6:41 PM

## 2021-09-11 NOTE — Plan of Care (Signed)
  Problem: Clinical Measurements: Goal: Diagnostic test results will improve Outcome: Progressing   Problem: Clinical Measurements: Goal: Respiratory complications will improve Outcome: Progressing   Problem: Clinical Measurements: Goal: Cardiovascular complication will be avoided Outcome: Progressing   Problem: Elimination: Goal: Will not experience complications related to bowel motility Outcome: Progressing   Problem: Skin Integrity: Goal: Risk for impaired skin integrity will decrease Outcome: Progressing   

## 2021-09-11 NOTE — Plan of Care (Signed)
  Problem: Education: Goal: Knowledge of General Education information will improve Description: Including pain rating scale, medication(s)/side effects and non-pharmacologic comfort measures Outcome: Progressing   Problem: Health Behavior/Discharge Planning: Goal: Ability to manage health-related needs will improve Outcome: Progressing   Problem: Clinical Measurements: Goal: Respiratory complications will improve Outcome: Not Applicable   Problem: Activity: Goal: Risk for activity intolerance will decrease Outcome: Progressing

## 2021-09-11 NOTE — Plan of Care (Signed)

## 2021-09-12 DIAGNOSIS — L03116 Cellulitis of left lower limb: Secondary | ICD-10-CM | POA: Diagnosis not present

## 2021-09-12 DIAGNOSIS — Z880 Allergy status to penicillin: Secondary | ICD-10-CM | POA: Diagnosis not present

## 2021-09-12 DIAGNOSIS — L03114 Cellulitis of left upper limb: Secondary | ICD-10-CM | POA: Diagnosis not present

## 2021-09-12 LAB — CBC
HCT: 39.1 % (ref 39.0–52.0)
Hemoglobin: 12.8 g/dL — ABNORMAL LOW (ref 13.0–17.0)
MCH: 30.3 pg (ref 26.0–34.0)
MCHC: 32.7 g/dL (ref 30.0–36.0)
MCV: 92.7 fL (ref 80.0–100.0)
Platelets: 446 10*3/uL — ABNORMAL HIGH (ref 150–400)
RBC: 4.22 MIL/uL (ref 4.22–5.81)
RDW: 12.7 % (ref 11.5–15.5)
WBC: 7 10*3/uL (ref 4.0–10.5)
nRBC: 0 % (ref 0.0–0.2)

## 2021-09-12 LAB — BASIC METABOLIC PANEL
Anion gap: 6 (ref 5–15)
BUN: 12 mg/dL (ref 8–23)
CO2: 29 mmol/L (ref 22–32)
Calcium: 8.6 mg/dL — ABNORMAL LOW (ref 8.9–10.3)
Chloride: 106 mmol/L (ref 98–111)
Creatinine, Ser: 0.98 mg/dL (ref 0.61–1.24)
GFR, Estimated: >60 mL/min (ref >60)
Glucose, Bld: 105 mg/dL — ABNORMAL HIGH (ref 70–99)
Potassium: 4.1 mmol/L (ref 3.5–5.1)
Sodium: 141 mmol/L (ref 135–145)

## 2021-09-12 MED ORDER — DIPHENHYDRAMINE HCL 50 MG/ML IJ SOLN: 25.0000 mg | Freq: Once | INTRAMUSCULAR | Status: DC | PRN

## 2021-09-12 MED ORDER — EPINEPHRINE 0.3 MG/0.3ML IJ SOAJ: 0.3000 mg | Freq: Once | INTRAMUSCULAR | Status: DC | PRN

## 2021-09-12 MED ORDER — AMOXICILLIN 500 MG PO CAPS
500.0000 mg | ORAL_CAPSULE | Freq: Once | ORAL | Status: AC
  Administered 2021-09-12: 500 mg via ORAL
  Filled 2021-09-12 (×2): qty 1

## 2021-09-12 NOTE — Progress Notes (Signed)
Penicillin Allergy Clarification: Oral Amoxicillin Challenge  History of allergy:  Low Risk - listed as rash, per discussion with patient - happened "a long time ago and did not require hospitalization or immediate attention"  Type of intervention (select all that apply):  Amoxicillin Oral Challenge - discussed with patient and agreeable to proceed. Also discussed plan and monitoring with the patient's RN  Impact on therapy:  TBD  Plan - Amoxicillin 500 mg po x 1 dose at 1400 today - Prn bendaryl and epi-pen as needed - Vitals q81mn x 1 hour - discussed with RN - Will follow-up on tolerance and ability to de-label/remove the penicillin allergy  Thank you for allowing pharmacy to be a part of this patient's care.  EAlycia Rossetti PharmD, BCPS Infectious Diseases Clinical Pharmacist 09/12/2021 1:42 PM   **Pharmacist phone directory can now be found on [email or web address] ([email or web address]).  Listed under [place]

## 2021-09-12 NOTE — Progress Notes (Signed)
Penicillin Allergy Clarification: Oral Amoxicillin Challenge  History of allergy:  Low Risk - listed as rash, per discussion with patient - happened "a long time ago and did not require hospitalization or immediate attention"  Type of intervention (select all that apply):  Amoxicillin Oral Challenge - tolerated 500 mg po dose x 1 without any issues.   Impact on therapy:  TBD  Plan - De-label PCN allergy - Discussed with patient - can make providers aware he can utilize penicillins in the future  Thank you for allowing pharmacy to be a part of this patient's care.  Alycia Rossetti, PharmD, BCPS Infectious Diseases Clinical Pharmacist 09/12/2021 1:42 PM   **Pharmacist phone directory can now be found on [email or web address] ([email or web address]).  Listed under [place].

## 2021-09-12 NOTE — Hospital Course (Addendum)
71 y.o. male with medical history significant of hypertension, hypothyroidism, obstructive sleep apnea on CPAP presented with progressively worsening left lower extremity swelling and redness.  On the presentation, he was found to have left lower extremity cellulitis and started on broad-spectrum antibiotics.  Lower extremity duplex was negative for DVT.  Seen by infectious disease.  MRI showed Diffuse cellulitis without discrete subcutaneous abscess. 2. Mild changes of myofasciitis but no findings for pyomyositis. 3. No findings for septic arthritis or osteomyelitis."  Being managed with IV antibiotics as per ID.  Overall improving plan for discharge home once cleared by ID.  Seen by ID this morning okay to discharge on oral cephalexin that were sent the prescription.  Patient informed to follow-up with PCP in 1 week, or if any worsening of redness swelling pain or fever chills nausea vomiting

## 2021-09-12 NOTE — Progress Notes (Signed)
PROGRESS NOTE Jonathan Powers  IWL:[id number] DOB: May 11, 1950 DOA: 09/05/2021 PCP: [name], MD   Brief Narrative/Hospital Course: 71 y.o. male with medical history significant of hypertension, hypothyroidism, obstructive sleep apnea on CPAP presented with progressively worsening left lower extremity swelling and redness.  On the presentation, he was found to have left lower extremity cellulitis and started on broad-spectrum antibiotics.  Lower extremity duplex was negative for DVT.  Seen by infectious disease.  MRI showed Diffuse cellulitis without discrete subcutaneous abscess. 2. Mild changes of myofasciitis but no findings for pyomyositis. 3. No findings for septic arthritis or osteomyelitis."      Subjective: Seen and examined this morning.  Reports left leg still red painful swollen does not feel much improved.   Assessment and Plan: Principal Problem:   Left leg cellulitis Active Problems:   Obesity (BMI 30-39.9)   Essential hypertension   Hyperlipidemia   Hypothyroidism   Cellulitis   Hyponatremia   Left leg cellulitis: Duplex negative for DVT, x-ray negative.MRI suggested possibility of early myofascitis done last week.  ID managing continue on cefazolin for now as he is not feeling well.  If pain or edema worsens may need a repeat MRI with and without contrast.  Penicillin allergy does not recall the nature of allergy, ID planning for amoxicillin challenge Essential hypertension: Stable without medication Hyperlipidemia: On Crestor Hypokalemia- resolved. Hypothyroidism:cont synthroid Hyponatremia:resolved. Class II Obesity:Patient's Body mass index is 36.65 kg/m. : Will benefit with PCP follow-up, weight loss  healthy lifestyle   DVT prophylaxis:  Code Status:   Code Status: DNR Family Communication: plan of care discussed with patient at bedside.  Discussed with ID  Patient status is: Inpatient because of IV antibiotics Level of care: Med-Surg   Dispo: The  patient is from: home            Anticipated disposition: home 1-2 days Mobility Assessment (last 72 hours)     Mobility Assessment     Row Name 09/12/21 0723 09/11/21 2148 09/11/21 0928 09/11/21 0924 09/10/21 2040   Does patient have an order for bedrest or is patient medically unstable No - Continue assessment No - Continue assessment No - Continue assessment No - Continue assessment No - Continue assessment   What is the highest level of mobility based on the progressive mobility assessment? Level 5 (Walks with assist in room/hall) - Balance while stepping forward/back and can walk in room with assist - Complete Level 5 (Walks with assist in room/hall) - Balance while stepping forward/back and can walk in room with assist - Complete Level 5 (Walks with assist in room/hall) - Balance while stepping forward/back and can walk in room with assist - Complete Level 5 (Walks with assist in room/hall) - Balance while stepping forward/back and can walk in room with assist - Complete Level 5 (Walks with assist in room/hall) - Balance while stepping forward/back and can walk in room with assist - Complete    Row Name 09/10/21 0730 09/09/21 2000         Does patient have an order for bedrest or is patient medically unstable No - Continue assessment No - Continue assessment      What is the highest level of mobility based on the progressive mobility assessment? Level 5 (Walks with assist in room/hall) - Balance while stepping forward/back and can walk in room with assist - Complete Level 5 (Walks with assist in room/hall) - Balance while stepping forward/back and can walk in room with assist -  Complete                Objective: Vitals last 24 hrs: Vitals:   09/11/21 1246 09/11/21 2201 09/12/21 0552 09/12/21 1008  BP: 134/77 (!) 141/69 130/71 135/65  Pulse: (!) 55 (!) 57 62 64  Resp: '18 18 18 18  '$ Temp: 98.2 F (36.8 C) 98.9 F (37.2 C) 98.6 F (37 C) 98.4 F (36.9 C)  TempSrc: Oral Oral Oral  Oral  SpO2: 96% 97% 96% 93%  Weight:   117.5 kg   Height:       Weight change:   Physical Examination: General exam: alert awake,older than stated age, weak appearing. HEENT:Oral mucosa moist, Ear/Nose WNL grossly, dentition normal. Respiratory system: bilaterally clear BS, no use of accessory muscle Cardiovascular system: S1 & S2 +, No JVD. Gastrointestinal system: Abdomen soft,NT,ND, BS+ Nervous System:Alert, awake, moving extremities and grossly nonfocal Extremities: LE edema neg,distal peripheral pulses palpable.  Skin: No rashes,no icterus. MSK: Normal muscle bulk,tone, power  Medications reviewed:  Scheduled Meds:  amoxicillin  500 mg Oral Once   enoxaparin (LOVENOX) injection  60 mg Subcutaneous Q24H   guaiFENesin  600 mg Oral BID   levothyroxine  175 mcg Oral QAC breakfast   loratadine  10 mg Oral Daily   pantoprazole  40 mg Oral Daily   rosuvastatin  5 mg Oral QHS   senna-docusate  1 tablet Oral QHS   Continuous Infusions:   ceFAZolin (ANCEF) IV 2 g (09/12/21 0829)      Diet Order             Diet regular Room service appropriate? Yes; Fluid consistency: Thin  Diet effective now                            Intake/Output Summary (Last 24 hours) at 09/12/2021 1117 Last data filed at 09/12/2021 0050 Gross per 24 hour  Intake 700.14 ml  Output --  Net 700.14 ml   Net IO Since Admission: 9,767.1 mL [09/12/21 1117]  Wt Readings from Last 3 Encounters:  09/12/21 117.5 kg  07/07/21 120.7 kg  06/25/21 124.7 kg     Unresulted Labs (From admission, onward)     Start     Ordered   09/12/21 0500  Creatinine, serum  (enoxaparin (LOVENOX)    CrCl >/= 30 ml/min)  Weekly,   R     Comments: while on enoxaparin therapy    09/05/21 1706          Data Reviewed: I have personally reviewed following labs and imaging studies CBC: Recent Labs  Lab 09/05/21 1456 09/05/21 1958 09/06/21 0330 09/07/21 0304 09/10/21 0325 09/11/21 0328 09/12/21 0313  WBC  10.9*   < > 9.9 9.6 7.4 6.5 7.0  NEUTROABS 8.5*  --   --  6.7 4.3 3.7  --   HGB 14.6   < > 13.0 13.0 13.1 12.9* 12.8*  HCT 43.6   < > 38.5* 38.3* 39.4 38.9* 39.1  MCV 92.0   < > 92.1 91.8 91.2 91.5 92.7  PLT 262   < > 230 269 403* 445* 446*   < > = values in this interval not displayed.   Basic Metabolic Panel: Recent Labs  Lab 09/06/21 0330 09/07/21 0304 09/08/21 0330 09/09/21 0320 09/10/21 0325 09/11/21 0328 09/12/21 0313  NA 138 140 139 137 141 141 141  K 3.3* 3.4* 3.1* 3.8 3.5 3.9 4.1  CL 104 104 104 101  105 105 106  CO2 '25 26 28 26 27 27 29  '$ GLUCOSE 106* 125* 119* 104* 107* 105* 105*  BUN 13 8 7* 7* '11 11 12  '$ CREATININE 0.90 0.87 0.76 0.83 0.93 0.92 0.98  CALCIUM 8.6* 8.8* 8.6* 9.0 8.9 8.9 8.6*  MG 1.6* 2.1 1.9  --  1.9 2.0  --    GFR: Estimated Creatinine Clearance: 92.1 mL/min (by C-G formula based on SCr of 0.98 mg/dL). Liver Function Tests: Recent Labs  Lab 09/05/21 1456 09/06/21 0330  AST 23 24  ALT 25 22  ALKPHOS 47 42  BILITOT 0.9 0.8  PROT 7.5 6.3*  ALBUMIN 3.8 3.2*   No results for input(s): "LIPASE", "AMYLASE" in the last 168 hours. No results for input(s): "AMMONIA" in the last 168 hours. Coagulation Profile: No results for input(s): "INR", "PROTIME" in the last 168 hours. BNP (last 3 results) Recent Labs    03/11/21 1021  PROBNP 54   HbA1C: No results for input(s): "HGBA1C" in the last 72 hours. CBG: No results for input(s): "GLUCAP" in the last 168 hours. Lipid Profile: No results for input(s): "CHOL", "HDL", "LDLCALC", "TRIG", "CHOLHDL", "LDLDIRECT" in the last 72 hours. Thyroid Function Tests: No results for input(s): "TSH", "T4TOTAL", "FREET4", "T3FREE", "THYROIDAB" in the last 72 hours. Sepsis Labs: No results for input(s): "PROCALCITON", "LATICACIDVEN" in the last 168 hours.  Recent Results (from the past 240 hour(s))  Blood culture (routine x 2)     Status: None   Collection Time: 09/05/21  1:44 PM   Specimen: BLOOD  Result  Value Ref Range Status   Specimen Description   Final    BLOOD LEFT ANTECUBITAL Performed at [facility] [address], [place], [place] [address]    Special Requests   Final    BOTTLES DRAWN AEROBIC AND ANAEROBIC Blood Culture adequate volume Performed at [facility] [address] [place], [place] [address]    Culture   Final    NO GROWTH 5 DAYS Performed at [facility], [place] [address] [place], [place] [address]    Report Status 09/10/2021 FINAL  Final  Blood culture (routine x 2)     Status: None   Collection Time: 09/05/21  4:20 PM   Specimen: BLOOD  Result Value Ref Range Status   Specimen Description   Final    BLOOD BLOOD RIGHT FOREARM Performed at [facility] [address] [place], [place] [address]    Special Requests   Final    BOTTLES DRAWN AEROBIC AND ANAEROBIC Blood Culture adequate volume Performed at [facility] [address] [place], [place] [address]    Culture   Final    NO GROWTH 5 DAYS Performed at [facility], [place] [address], [place], [place] [address]    Report Status 09/10/2021 FINAL  Final  MRSA Next Gen by PCR, Nasal     Status: None   Collection Time: 09/07/21 10:41 AM   Specimen: Nasal Mucosa; Nasal Swab  Result Value Ref Range Status   MRSA by PCR Next Gen NOT DETECTED NOT DETECTED Final    Comment: (NOTE) The GeneXpert MRSA Assay (FDA approved for NASAL specimens only), is one component of a comprehensive MRSA colonization surveillance program. It is not intended to diagnose MRSA infection nor to guide or monitor treatment for MRSA infections. Test performance is not FDA approved in patients less than 34 years old. Performed at [facility],  [place] [address], [place], [place] [address]   SARS Coronavirus 2 by RT PCR (hospital order, performed in [facility] hospital lab) *cepheid single result test* Anterior Nasal Swab      Status: None   Collection Time: 09/08/21 11:25 AM   Specimen: Anterior Nasal Swab  Result Value Ref Range Status   SARS Coronavirus 2 by RT PCR NEGATIVE NEGATIVE Final    Comment: (NOTE) SARS-CoV-2 target nucleic acids are NOT DETECTED.  The SARS-CoV-2 RNA is generally detectable in upper and lower respiratory specimens during the acute phase of infection. The lowest concentration of SARS-CoV-2 viral copies this assay can detect is 250 copies / mL. A negative result does not preclude SARS-CoV-2 infection and should not be used as the sole basis for treatment or other patient management decisions.  A negative result may occur with improper specimen collection / handling, submission of specimen other than nasopharyngeal swab, presence of viral mutation(s) within the areas targeted by this assay, and inadequate number of viral copies (<250 copies / mL). A negative result must be combined with clinical observations, patient history, and epidemiological information.  Fact Sheet for Patients:   https://www.patel.info/  Fact Sheet for Healthcare Providers: https://hall.com/  This test is not yet approved or  cleared by the [place] FDA and has been authorized for detection and/or diagnosis of SARS-CoV-2 by FDA under an Emergency Use Authorization (EUA).  This EUA will remain in effect (meaning this test can be used) for the duration of the COVID-19 declaration under Section 564(b)(1) of the Act, 21 U.S.C. section 360bbb-3(b)(1), unless the authorization is terminated or revoked sooner.  Performed at [facility], [place] [address] [place], [place] [address]   Expectorated Sputum Assessment w Gram Stain, Rflx to Resp Cult     Status: None   Collection Time: 09/09/21  8:45 AM   Specimen: Expectorated Sputum  Result Value Ref Range Status   Specimen Description EXPECTORATED SPUTUM  Final   Special Requests NONE  Final    Sputum evaluation   Final    Sputum specimen not acceptable for testing.  Please recollect.   Performed at [facility], [place] [address] [place], [place] [address]    Report Status 09/09/2021 FINAL  Final    Antimicrobials: Anti-infectives (From admission, onward)    Start     Dose/Rate Route Frequency Ordered Stop   09/12/21 1400  amoxicillin (AMOXIL) capsule 500 mg        500 mg Oral  Once 09/12/21 1103     09/09/21 1600  ceFAZolin (ANCEF) IVPB 2g/100 mL premix        2 g 200 mL/hr over 30 Minutes Intravenous Every 8 hours 09/09/21 1521     09/06/21 1800  vancomycin (VANCOREADY) IVPB 1500 mg/300 mL  Status:  Discontinued        1,500 mg 150 mL/hr over 120 Minutes Intravenous Every 24 hours 09/05/21 1721 09/06/21 1056   09/06/21 1600  cefTRIAXone (ROCEPHIN) 2 g in sodium chloride 0.9 % 100 mL IVPB  Status:  Discontinued        2 g 200 mL/hr over 30 Minutes Intravenous Every 24 hours 09/05/21 1706 09/09/21 1515   09/06/21 1200  vancomycin (VANCOCIN) IVPB 1000 mg/200 mL premix  Status:  Discontinued        1,000 mg 200 mL/hr over 60 Minutes Intravenous Every 12 hours 09/06/21 1057 09/09/21 1515   09/05/21 1730  vancomycin (VANCOREADY) IVPB 2000 mg/400 mL  2,000 mg 200 mL/hr over 120 Minutes Intravenous  Once 09/05/21 1720 09/05/21 2057   09/05/21 1415  cefTRIAXone (ROCEPHIN) 2 g in sodium chloride 0.9 % 100 mL IVPB        2 g 200 mL/hr over 30 Minutes Intravenous  Once 09/05/21 1406 09/05/21 1655   09/05/21 1345  cefTRIAXone (ROCEPHIN) injection 2 g  Status:  Discontinued        2 g Intramuscular  Once 09/05/21 1344 09/05/21 1406      Culture/Microbiology    Component Value Date/Time   SDES EXPECTORATED SPUTUM 09/09/2021 0845   SPECREQUEST NONE 09/09/2021 0845   CULT  09/05/2021 1620    NO GROWTH 5 DAYS Performed at [facility] [address], [place], [place] [address]    REPTSTATUS 09/09/2021 FINAL 09/09/2021 0845  Other culture-see note   Radiology Studies: No results found.   LOS: 6 days   Antonieta Pert, MD [facility]  09/12/2021, 11:17 AM

## 2021-09-12 NOTE — Plan of Care (Signed)
  Problem: Pain Managment: Goal: General experience of comfort will improve Outcome: Progressing   Problem: Safety: Goal: Ability to remain free from injury will improve Outcome: Progressing   

## 2021-09-12 NOTE — Progress Notes (Signed)
Subjective: Feels poorly has been in bed for 12 hours   Antibiotics:  Anti-infectives (From admission, onward)    Start     Dose/Rate Route Frequency Ordered Stop   09/09/21 1600  ceFAZolin (ANCEF) IVPB 2g/100 mL premix        2 g 200 mL/hr over 30 Minutes Intravenous Every 8 hours 09/09/21 1521     09/06/21 1800  vancomycin (VANCOREADY) IVPB 1500 mg/300 mL  Status:  Discontinued        1,500 mg 150 mL/hr over 120 Minutes Intravenous Every 24 hours 09/05/21 1721 09/06/21 1056   09/06/21 1600  cefTRIAXone (ROCEPHIN) 2 g in sodium chloride 0.9 % 100 mL IVPB  Status:  Discontinued        2 g 200 mL/hr over 30 Minutes Intravenous Every 24 hours 09/05/21 1706 09/09/21 1515   09/06/21 1200  vancomycin (VANCOCIN) IVPB 1000 mg/200 mL premix  Status:  Discontinued        1,000 mg 200 mL/hr over 60 Minutes Intravenous Every 12 hours 09/06/21 1057 09/09/21 1515   09/05/21 1730  vancomycin (VANCOREADY) IVPB 2000 mg/400 mL        2,000 mg 200 mL/hr over 120 Minutes Intravenous  Once 09/05/21 1720 09/05/21 2057   09/05/21 1415  cefTRIAXone (ROCEPHIN) 2 g in sodium chloride 0.9 % 100 mL IVPB        2 g 200 mL/hr over 30 Minutes Intravenous  Once 09/05/21 1406 09/05/21 1655   09/05/21 1345  cefTRIAXone (ROCEPHIN) injection 2 g  Status:  Discontinued        2 g Intramuscular  Once 09/05/21 1344 09/05/21 1406       Medications: Scheduled Meds:  enoxaparin (LOVENOX) injection  60 mg Subcutaneous Q24H   guaiFENesin  600 mg Oral BID   levothyroxine  175 mcg Oral QAC breakfast   loratadine  10 mg Oral Daily   pantoprazole  40 mg Oral Daily   rosuvastatin  5 mg Oral QHS   senna-docusate  1 tablet Oral QHS   Continuous Infusions:   ceFAZolin (ANCEF) IV 2 g (09/12/21 0829)   PRN Meds:.acetaminophen **OR** acetaminophen, fluticasone, morphine injection, ondansetron **OR** ondansetron (ZOFRAN) IV, oxyCODONE, senna-docusate, traZODone    Objective: Weight change:   Intake/Output  Summary (Last 24 hours) at 09/12/2021 1051 Last data filed at 09/12/2021 0050 Gross per 24 hour  Intake 700.14 ml  Output --  Net 700.14 ml   Blood pressure 135/65, pulse 64, temperature 98.4 F (36.9 C), temperature source Oral, resp. rate 18, height 5' 10.5" (1.791 m), weight 117.5 kg, SpO2 93 %. Temp:  [98.2 F (36.8 C)-98.9 F (37.2 C)] 98.4 F (36.9 C) (07/24 1008) Pulse Rate:  [55-64] 64 (07/24 1008) Resp:  [18] 18 (07/24 1008) BP: (130-141)/(65-77) 135/65 (07/24 1008) SpO2:  [93 %-97 %] 93 % (07/24 1008) Weight:  [117.5 kg] 117.5 kg (07/24 0552)  Physical Exam: Physical Exam Constitutional:      Appearance: He is well-developed.  HENT:     Head: Normocephalic and atraumatic.  Eyes:     Conjunctiva/sclera: Conjunctivae normal.  Cardiovascular:     Rate and Rhythm: Normal rate and regular rhythm.  Pulmonary:     Effort: Pulmonary effort is normal. No respiratory distress.     Breath sounds: Normal breath sounds. No stridor. No wheezing.  Abdominal:     General: There is no distension.     Palpations: Abdomen is soft.  Musculoskeletal:  General: Swelling present.     Cervical back: Normal range of motion and neck supple.     Left lower leg: Edema present.  Skin:    General: Skin is warm and dry.     Findings: No erythema or rash.  Neurological:     General: No focal deficit present.     Mental Status: He is alert and oriented to person, place, and time.  Psychiatric:        Mood and Affect: Mood normal.        Behavior: Behavior normal.        Thought Content: Thought content normal.        Judgment: Judgment normal.     Cellulitic area 09/12/2021:     CBC:    BMET Recent Labs    09/11/21 0328 09/12/21 0313  NA 141 141  K 3.9 4.1  CL 105 106  CO2 27 29  GLUCOSE 105* 105*  BUN 11 12  CREATININE 0.92 0.98  CALCIUM 8.9 8.6*     Liver Panel  No results for input(s): "PROT", "ALBUMIN", "AST", "ALT", "ALKPHOS", "BILITOT", "BILIDIR",  "IBILI" in the last 72 hours.     Sedimentation Rate Recent Labs    09/11/21 0328  ESRSEDRATE 86*   C-Reactive Protein Recent Labs    09/11/21 0328  CRP 4.2*    Micro Results: Recent Results (from the past 720 hour(s))  Blood culture (routine x 2)     Status: None   Collection Time: 09/05/21  1:44 PM   Specimen: BLOOD  Result Value Ref Range Status   Specimen Description   Final    BLOOD LEFT ANTECUBITAL Performed at [facility] [address] [place], [place] [address]    Special Requests   Final    BOTTLES DRAWN AEROBIC AND ANAEROBIC Blood Culture adequate volume Performed at [facility] [address], [place], [place] [address]    Culture   Final    NO GROWTH 5 DAYS Performed at [facility], [place] [address], [place], [place] [address]    Report Status 09/10/2021 FINAL  Final  Blood culture (routine x 2)     Status: None   Collection Time: 09/05/21  4:20 PM   Specimen: BLOOD  Result Value Ref Range Status   Specimen Description   Final    BLOOD BLOOD RIGHT FOREARM Performed at [facility] [address], [place], [place] [address]    Special Requests   Final    BOTTLES DRAWN AEROBIC AND ANAEROBIC Blood Culture adequate volume Performed at [facility] [address], [place], [place] [address]    Culture   Final    NO GROWTH 5 DAYS Performed at [facility], [place] [address], [place], [place] [address]    Report Status 09/10/2021 FINAL  Final  MRSA Next Gen by PCR, Nasal     Status: None   Collection Time: 09/07/21 10:41 AM   Specimen: Nasal Mucosa; Nasal Swab  Result Value Ref Range Status   MRSA by PCR Next Gen NOT DETECTED NOT DETECTED Final    Comment: (NOTE) The GeneXpert MRSA Assay (FDA approved for NASAL specimens only), is one component of a comprehensive MRSA colonization surveillance program. It is not intended to diagnose MRSA infection nor to  guide or monitor treatment for MRSA infections. Test performance is not FDA approved in patients less than 60 years old. Performed at [facility],  [place] [address], [place], [place] [address]   SARS Coronavirus 2 by RT PCR (hospital order, performed in [facility] hospital lab) *cepheid single result test* Anterior Nasal Swab     Status: None   Collection Time: 09/08/21 11:25 AM   Specimen: Anterior Nasal Swab  Result Value Ref Range Status   SARS Coronavirus 2 by RT PCR NEGATIVE NEGATIVE Final    Comment: (NOTE) SARS-CoV-2 target nucleic acids are NOT DETECTED.  The SARS-CoV-2 RNA is generally detectable in upper and lower respiratory specimens during the acute phase of infection. The lowest concentration of SARS-CoV-2 viral copies this assay can detect is 250 copies / mL. A negative result does not preclude SARS-CoV-2 infection and should not be used as the sole basis for treatment or other patient management decisions.  A negative result may occur with improper specimen collection / handling, submission of specimen other than nasopharyngeal swab, presence of viral mutation(s) within the areas targeted by this assay, and inadequate number of viral copies (<250 copies / mL). A negative result must be combined with clinical observations, patient history, and epidemiological information.  Fact Sheet for Patients:   https://www.patel.info/  Fact Sheet for Healthcare Providers: https://hall.com/  This test is not yet approved or  cleared by the [place] FDA and has been authorized for detection and/or diagnosis of SARS-CoV-2 by FDA under an Emergency Use Authorization (EUA).  This EUA will remain in effect (meaning this test can be used) for the duration of the COVID-19 declaration under Section 564(b)(1) of the Act, 21 U.S.C. section 360bbb-3(b)(1), unless the authorization is terminated or revoked  sooner.  Performed at [facility], [place] [address], [place], [place] [address]   Expectorated Sputum Assessment w Gram Stain, Rflx to Resp Cult     Status: None   Collection Time: 09/09/21  8:45 AM   Specimen: Expectorated Sputum  Result Value Ref Range Status   Specimen Description EXPECTORATED SPUTUM  Final   Special Requests NONE  Final   Sputum evaluation   Final    Sputum specimen not acceptable for testing.  Please recollect.   Performed at [facility], [place] [address] [place], [place] [address]    Report Status 09/09/2021 FINAL  Final    Studies/Results: No results found.    Assessment/Plan:  INTERVAL HISTORY: pt remains afebrile but still feels poorly  Active Problems:   Obesity (BMI 30-39.9)   Essential hypertension   Hyperlipidemia   Hypothyroidism   Cellulitis   Hyponatremia    Jonathan Powers is a 71 y.o. male with patient with nonpurulent cellulitis narrowed to vancomycin and ceftriaxone to cefazolin.  MRI had suggested possibility of early myofascitits done last week though I wonder if that was an overcall.  #1 cellulitis: Would continue cefazolin for now given he still not feeling well.  If his pain or edema worsens would repeat MRI with and without contrast but we need more time for this to be a meaningful study.  #2 penicillin allergy does not recall the nature of it does not believe he was hospitalized we will give an amoxicillin challenge today.      LOS: 6 days   Alcide Evener 09/12/2021, 10:51 AM

## 2021-09-13 ENCOUNTER — Other Ambulatory Visit (HOSPITAL_COMMUNITY): Payer: Self-pay

## 2021-09-13 DIAGNOSIS — E783 Hyperchylomicronemia: Secondary | ICD-10-CM | POA: Diagnosis not present

## 2021-09-13 DIAGNOSIS — E032 Hypothyroidism due to medicaments and other exogenous substances: Secondary | ICD-10-CM

## 2021-09-13 DIAGNOSIS — L03116 Cellulitis of left lower limb: Secondary | ICD-10-CM | POA: Diagnosis not present

## 2021-09-13 MED ORDER — CEFADROXIL 500 MG PO CAPS
1000.0000 mg | ORAL_CAPSULE | Freq: Two times a day (BID) | ORAL | 0 refills | Status: DC
  Filled 2021-09-13: qty 20, 5d supply, fill #0

## 2021-09-13 MED ORDER — CEFADROXIL 500 MG PO CAPS
1000.0000 mg | ORAL_CAPSULE | Freq: Two times a day (BID) | ORAL | Status: DC
  Administered 2021-09-13: 1000 mg via ORAL
  Filled 2021-09-13: qty 2

## 2021-09-13 NOTE — Discharge Summary (Signed)
Physician Discharge Summary  AUDRA BELLARD POE:[id number] DOB: 1950/10/15 DOA: 09/05/2021  PCP: [name], MD  Admit date: 09/05/2021 Discharge date: 09/13/2021 Recommendations for Outpatient Follow-up:  Follow up with PCP, ID in 1 weeks-call for appointment Please obtain BMP/CBC in one week  Discharge Dispo: home Discharge Condition: Stable Code Status:   Code Status: DNR Diet recommendation:  Diet Order             Diet regular Room service appropriate? Yes; Fluid consistency: Thin  Diet effective now                    Brief/Interim Summary: 71 y.o. male with medical history significant of hypertension, hypothyroidism, obstructive sleep apnea on CPAP presented with progressively worsening left lower extremity swelling and redness.  On the presentation, he was found to have left lower extremity cellulitis and started on broad-spectrum antibiotics.  Lower extremity duplex was negative for DVT.  Seen by infectious disease.  MRI showed Diffuse cellulitis without discrete subcutaneous abscess. 2. Mild changes of myofasciitis but no findings for pyomyositis. 3. No findings for septic arthritis or osteomyelitis."  Being managed with IV antibiotics as per ID.  Overall improving plan for discharge home once cleared by ID.  Seen by ID this morning okay to discharge on oral cephalexin that were sent the prescription.  Patient informed to follow-up with PCP in 1 week, or if any worsening of redness swelling pain or fever chills nausea vomiting     Discharge Diagnoses:  Principal Problem:   Left leg cellulitis Active Problems:   Obesity (BMI 30-39.9)   Essential hypertension   Hyperlipidemia   Hypothyroidism   Cellulitis   Hyponatremia  Left leg cellulitis: Duplex negative for DVT, x-ray negative.MRI suggested possibility of early myofascitis done last week.  ID managing cw/ iv cefazolin-he does have pain but overall improving, seen and cleared by ID for discharge home on oral  Keflex.  Outpatient follow-up with instruction provided  Penicillin allergy does not recall the nature of allergy, ID  did amoxicillin challenge-tolerated Essential hypertension: Stable without medication Hyperlipidemia: On Crestor Hypokalemia- resolved. Hypothyroidism:cont synthroid Hyponatremia:resolved. Class II Obesity:Patient's Body mass index is 36.65 kg/m. : Will benefit with PCP follow-up, weight loss  healthy lifestyle  Consults: Infectious disease Subjective: Alert awake oriented complains of some pain in his left leg has erythema swelling but overall better. Discharge Exam: Vitals:   09/13/21 0546 09/13/21 0800  BP: 129/69   Pulse: 60   Resp: 18   Temp: 98.7 F (37.1 C)   SpO2: 93% 98%   General: Pt is alert, awake, not in acute distress Cardiovascular: RRR, S1/S2 +, no rubs, no gallops Respiratory: CTA bilaterally, no wheezing, no rhonchi Abdominal: Soft, NT, ND, bowel sounds + Extremities: no edema, no cyanosis  Discharge Instructions  Discharge Instructions     Discharge instructions   Complete by: As directed    Please call call MD or return to ER for similar or worsening recurring problem with worsening redness swelling pain after extremity artery fever, nausea vomiting,abdominal pain, uncontrolled pain, chest pain,  shortness of breath or any other alarming symptoms.  Please follow-up your doctor as instructed in a week time and call the office for appointment.  Please avoid alcohol, smoking, or any other illicit substance and maintain healthy habits including taking your regular medications as prescribed.  You were cared for by a hospitalist during your hospital stay. If you have any questions about your discharge medications or the  care you received while you were in the hospital after you are discharged, you can call the unit and ask to speak with the hospitalist on call if the hospitalist that took care of you is not available.  Once you are  discharged, your primary care physician will handle any further medical issues. Please note that NO REFILLS for any discharge medications will be authorized once you are discharged, as it is imperative that you return to your primary care physician (or establish a relationship with a primary care physician if you do not have one) for your aftercare needs so that they can reassess your need for medications and monitor your lab values   Increase activity slowly   Complete by: As directed       Allergies as of 09/13/2021       Reactions   Nsaids Shortness Of Breath   Naproxen specifically causes SOB/wheeze tolerates topical diclofenac without problem Tylenol OK   Tolmetin Shortness Of Breath   Naproxen specifically causes SOB/wheeze tolerates topical diclofenac without problem Tylenol OK        Medication List     STOP taking these medications    clindamycin 150 MG capsule Commonly known as: CLEOCIN       TAKE these medications    acetaminophen 500 MG tablet Commonly known as: TYLENOL Take 1,000 mg by mouth every 6 (six) hours as needed for moderate pain.   cefadroxil 500 MG capsule Commonly known as: DURICEF Take 2 capsules (1,000 mg total) by mouth 2 (two) times daily for 5 days.   cetirizine 10 MG tablet Commonly known as: ZYRTEC Take 10 mg by mouth daily.   esomeprazole 40 MG capsule Commonly known as: NexIUM Take 1 capsule (40 mg total) by mouth daily.   fluticasone 50 MCG/ACT nasal spray Commonly known as: FLONASE Place 1 spray into both nostrils daily as needed for allergies.   ketoconazole 2 % cream Commonly known as: NIZORAL Apply 1 Application topically daily as needed for irritation (rash).   levothyroxine 175 MCG tablet Commonly known as: SYNTHROID Take 1 tablet (175 mcg total) by mouth daily before breakfast.   olmesartan-hydrochlorothiazide 40-25 MG tablet Commonly known as: BENICAR HCT Take 1 tablet by mouth daily. As directed    rosuvastatin 5 MG tablet Commonly known as: CRESTOR TAKE ONE TABLET BY MOUTH DAILY AT BEDTIME What changed: when to take this   tadalafil 5 MG tablet Commonly known as: CIALIS Take 5 mg by mouth daily as needed for erectile dysfunction.   traZODone 100 MG tablet Commonly known as: DESYREL TAKE 1 TABLET BY MOUTH AT BEDTIME AS NEEDED FOR SLEEP.   valACYclovir 1000 MG tablet Commonly known as: VALTREX Take 1 tablet (1,000 mg total) by mouth 2 (two) times daily. What changed:  when to take this reasons to take this        Allergies  Allergen Reactions   Nsaids Shortness Of Breath    Naproxen specifically causes SOB/wheeze tolerates topical diclofenac without problem Tylenol OK   Tolmetin Shortness Of Breath    Naproxen specifically causes SOB/wheeze tolerates topical diclofenac without problem Tylenol OK    The results of significant diagnostics from this hospitalization (including imaging, microbiology, ancillary and laboratory) are listed below for reference.    Microbiology: Recent Results (from the past 240 hour(s))  Blood culture (routine x 2)     Status: None   Collection Time: 09/05/21  1:44 PM   Specimen: BLOOD  Result Value Ref Range Status  Specimen Description   Final    BLOOD LEFT ANTECUBITAL Performed at [facility] [address], [place], [place] [address]    Special Requests   Final    BOTTLES DRAWN AEROBIC AND ANAEROBIC Blood Culture adequate volume Performed at [facility] [address], [place], [place] [address]    Culture   Final    NO GROWTH 5 DAYS Performed at [facility], [place] [address] [place], [place] [address]    Report Status 09/10/2021 FINAL  Final  Blood culture (routine x 2)     Status: None   Collection Time: 09/05/21  4:20 PM   Specimen: BLOOD  Result Value Ref Range Status   Specimen Description   Final    BLOOD BLOOD RIGHT FOREARM Performed at [facility] [address], [place], [place] [address]    Special Requests   Final    BOTTLES DRAWN AEROBIC AND ANAEROBIC Blood Culture adequate volume Performed at [facility] [address] [place], [place] [address]    Culture   Final    NO GROWTH 5 DAYS Performed at [facility], [place] [address] [place], [place] [address]    Report Status 09/10/2021 FINAL  Final  MRSA Next Gen by PCR, Nasal     Status: None   Collection Time: 09/07/21 10:41 AM   Specimen: Nasal Mucosa; Nasal Swab  Result Value Ref Range Status   MRSA by PCR Next Gen NOT DETECTED NOT DETECTED Final    Comment: (NOTE) The GeneXpert MRSA Assay (FDA approved for NASAL specimens only), is one component of a comprehensive MRSA colonization surveillance program. It is not intended to diagnose MRSA infection nor to guide or monitor treatment for MRSA infections. Test performance is not FDA approved in patients less than 54 years old. Performed at [facility], [place] [address] [place], [place] [address]   SARS Coronavirus 2 by RT PCR (hospital order, performed in [facility] hospital lab) *cepheid single result test* Anterior Nasal Swab     Status: None   Collection Time: 09/08/21 11:25 AM   Specimen: Anterior Nasal Swab  Result Value Ref Range Status   SARS Coronavirus 2 by RT PCR NEGATIVE NEGATIVE Final    Comment: (NOTE) SARS-CoV-2 target nucleic acids are NOT DETECTED.  The SARS-CoV-2 RNA is generally detectable in upper and lower respiratory specimens during the acute phase of infection. The lowest concentration of SARS-CoV-2 viral copies this assay can detect is 250 copies / mL. A negative result does not preclude SARS-CoV-2 infection and should not be used as the sole basis for treatment or other patient management decisions.  A negative result may occur with improper specimen collection / handling, submission of specimen other than nasopharyngeal swab, presence  of viral mutation(s) within the areas targeted by this assay, and inadequate number of viral copies (<250 copies / mL). A negative result must be combined with clinical observations, patient history, and epidemiological information.  Fact Sheet for Patients:   https://www.patel.info/  Fact Sheet for Healthcare Providers: https://hall.com/  This test is not yet approved or  cleared by the [place] FDA and has been authorized for detection and/or diagnosis of SARS-CoV-2 by FDA under an Emergency Use Authorization (EUA).  This EUA will remain in effect (meaning this test can be used) for the duration of the COVID-19 declaration under Section 564(b)(1) of the Act, 21 U.S.C. section 360bbb-3(b)(1), unless the authorization is  terminated or revoked sooner.  Performed at [facility], [place] [address], [place], [place] [address]   Expectorated Sputum Assessment w Gram Stain, Rflx to Resp Cult     Status: None   Collection Time: 09/09/21  8:45 AM   Specimen: Expectorated Sputum  Result Value Ref Range Status   Specimen Description EXPECTORATED SPUTUM  Final   Special Requests NONE  Final   Sputum evaluation   Final    Sputum specimen not acceptable for testing.  Please recollect.   Performed at [facility], [place] [address] [place], [place] [address]    Report Status 09/09/2021 FINAL  Final    Procedures/Studies: MR TIBIA FIBULA LEFT W WO CONTRAST  Result Date: 09/08/2021 CLINICAL DATA:  Diffuse lower extremity swelling and redness. EXAM: MRI OF LOWER LEFT EXTREMITY WITHOUT AND WITH CONTRAST TECHNIQUE: Multiplanar, multisequence MR imaging of the left lower extremity was performed both before and after administration of intravenous contrast. CONTRAST:  26m GADAVIST GADOBUTROL 1 MMOL/ML IV SOLN COMPARISON:  None Available. FINDINGS: Diffuse and marked subcutaneous soft tissue swelling/edema/fluid and skin  thickening consistent with severe cellulitis. No focal rim enhancing fluid collection to suggest a drainable abscess. There are also mild changes of myofasciitis most notably involving the interosseous membrane and posterior tibialis muscle but no findings for pyomyositis. Small knee joint effusion but no findings suspicious for septic arthritis at the knee or ankle joint. No findings for osteomyelitis. IMPRESSION: 1. Diffuse cellulitis without discrete subcutaneous abscess. 2. Mild changes of myofasciitis but no findings for pyomyositis. 3. No findings for septic arthritis or osteomyelitis. Electronically Signed   By: PMarijo SanesM.D.   On: 09/08/2021 19:05   DG Tibia/Fibula Left  Result Date: 09/08/2021 CLINICAL DATA:  Pain and swelling EXAM: LEFT TIBIA AND FIBULA - 2 VIEW COMPARISON:  None Available. FINDINGS: No recent fracture or dislocation is seen. There are no focal lytic lesions. There is previous surgical fusion of multiple tarsals in the posterior aspect of the ankle. Degenerative changes are noted in left knee with bony spurs, more so in the lateral compartment. There is diffuse edema in subcutaneous plane. There is soft tissue swelling around the ankle, more so over the lateral malleolus. IMPRESSION: No recent fracture or dislocation is seen. There are no focal lytic lesions. Degenerative changes are noted in left knee, mostly in the lateral compartment. Electronically Signed   By: PElmer PickerM.D.   On: 09/08/2021 13:33   DG Chest 2 View  Result Date: 09/08/2021 CLINICAL DATA:  Cough, shortness of breath, no chest pain EXAM: CHEST - 2 VIEW COMPARISON:  06/25/2021 FINDINGS: The heart size and mediastinal contours are within normal limits. Both lungs are clear. The visualized skeletal structures are unremarkable. IMPRESSION: No active cardiopulmonary disease. Electronically Signed   By: HKathreen DevoidM.D.   On: 09/08/2021 13:32   VAS [place]OWER EXTREMITY VENOUS (DVT) (ONLY [facility] &  [facility])  Result Date: 09/05/2021  Lower Venous DVT Study Patient Name:  BKABLE HAYWOOD Date of Exam:   09/05/2021 Medical Rec #: [id number]        Accession #:    [id number] of Birth: 8Jan 09, 1952        Patient Gender: M Patient Age:   89years [facility]      VAS [place]OWER EXTREMITY VENOUS (DVT) Referring Phys: LESLIE SOFIA --------------------------------------------------------------------------------  Indications: Swelling.  Risk Factors: None identified. Limitations: Poor ultrasound/tissue interface.  Comparison Study: No prior studies. Performing Technologist: Oliver Hum RVT  Examination Guidelines: A complete evaluation includes B-mode imaging, spectral Doppler, color Doppler, and power Doppler as needed of all accessible portions of each vessel. Bilateral testing is considered an integral part of a complete examination. Limited examinations for reoccurring indications may be performed as noted. The reflux portion of the exam is performed with the patient in reverse Trendelenburg.  +-----+---------------+---------+-----------+----------+--------------+ RIGHTCompressibilityPhasicitySpontaneityPropertiesThrombus Aging +-----+---------------+---------+-----------+----------+--------------+ CFV  Full           Yes      Yes                                 +-----+---------------+---------+-----------+----------+--------------+   +---------+---------------+---------+-----------+----------+-------------------+ LEFT     CompressibilityPhasicitySpontaneityPropertiesThrombus Aging      +---------+---------------+---------+-----------+----------+-------------------+ CFV      Full           Yes      Yes                                      +---------+---------------+---------+-----------+----------+-------------------+ SFJ      Full                                                              +---------+---------------+---------+-----------+----------+-------------------+ FV Prox  Full                                                             +---------+---------------+---------+-----------+----------+-------------------+ FV Mid   Full                                                             +---------+---------------+---------+-----------+----------+-------------------+ FV Distal               Yes      Yes                                      +---------+---------------+---------+-----------+----------+-------------------+ PFV      Full                                                             +---------+---------------+---------+-----------+----------+-------------------+ POP      Full           Yes      Yes                                      +---------+---------------+---------+-----------+----------+-------------------+ PTV  Full                                                             +---------+---------------+---------+-----------+----------+-------------------+ PERO                                                  Not well visualized +---------+---------------+---------+-----------+----------+-------------------+    Summary: RIGHT: - No evidence of common femoral vein obstruction.  LEFT: - There is no evidence of deep vein thrombosis in the lower extremity. However, portions of this examination were limited- see technologist comments above.  - A cystic structure, measuring 2.2 cm high by 2.9 cm wide by greater than 5.6 cm long, is found in the popliteal fossa.  *See table(s) above for measurements and observations. Electronically signed by Servando Snare MD on 09/05/2021 at 4:23:20 PM.    Final     Labs: BNP (last 3 results) No results for input(s): "BNP" in the last 8760 hours. Basic Metabolic Panel: Recent Labs  Lab 09/07/21 0304 09/08/21 0330 09/09/21 0320 09/10/21 0325 09/11/21 0328 09/12/21 0313  NA 140 139 137 141  141 141  K 3.4* 3.1* 3.8 3.5 3.9 4.1  CL 104 104 101 105 105 106  CO2 '26 28 26 27 27 29  '$ GLUCOSE 125* 119* 104* 107* 105* 105*  BUN 8 7* 7* '11 11 12  '$ CREATININE 0.87 0.76 0.83 0.93 0.92 0.98  CALCIUM 8.8* 8.6* 9.0 8.9 8.9 8.6*  MG 2.1 1.9  --  1.9 2.0  --    Liver Function Tests: No results for input(s): "AST", "ALT", "ALKPHOS", "BILITOT", "PROT", "ALBUMIN" in the last 168 hours. No results for input(s): "LIPASE", "AMYLASE" in the last 168 hours. No results for input(s): "AMMONIA" in the last 168 hours. CBC: Recent Labs  Lab 09/07/21 0304 09/10/21 0325 09/11/21 0328 09/12/21 0313  WBC 9.6 7.4 6.5 7.0  NEUTROABS 6.7 4.3 3.7  --   HGB 13.0 13.1 12.9* 12.8*  HCT 38.3* 39.4 38.9* 39.1  MCV 91.8 91.2 91.5 92.7  PLT 269 403* 445* 446*   Cardiac Enzymes: Recent Labs  Lab 09/09/21 0320 09/10/21 0325  CKTOTAL 68 39*   BNP: Invalid input(s): "POCBNP" CBG: No results for input(s): "GLUCAP" in the last 168 hours. D-Dimer No results for input(s): "DDIMER" in the last 72 hours. Hgb A1c No results for input(s): "HGBA1C" in the last 72 hours. Lipid Profile No results for input(s): "CHOL", "HDL", "LDLCALC", "TRIG", "CHOLHDL", "LDLDIRECT" in the last 72 hours. Thyroid function studies No results for input(s): "TSH", "T4TOTAL", "T3FREE", "THYROIDAB" in the last 72 hours.  Invalid input(s): "FREET3" Anemia work up No results for input(s): "VITAMINB12", "FOLATE", "FERRITIN", "TIBC", "IRON", "RETICCTPCT" in the last 72 hours. Urinalysis    Component Value Date/Time   COLORURINE YELLOW 03/18/2021 1053   APPEARANCEUR CLEAR 03/18/2021 1053   LABSPEC 1.010 03/18/2021 1053   PHURINE 5.0 03/18/2021 [place] 03/18/2021 [place] 03/18/2021 [place] 03/18/2021 [place] 03/18/2021 1053   PROTEINUR NEGATIVE 08/05/2019 1833   UROBILINOGEN 0.2 03/18/2021 1053   NITRITE NEGATIVE 03/18/2021 1053   LEUKOCYTESUR NEGATIVE  03/18/2021  [facility]  Lab 09/07/21 0304 09/10/21 0325 09/11/21 0328 09/12/21 0313  WBC 9.6 7.4 6.5 7.0   Microbiology Recent Results (from the past 240 hour(s))  Blood culture (routine x 2)     Status: None   Collection Time: 09/05/21  1:44 PM   Specimen: BLOOD  Result Value Ref Range Status   Specimen Description   Final    BLOOD LEFT ANTECUBITAL Performed at [facility] [address] [place], [place] [address]    Special Requests   Final    BOTTLES DRAWN AEROBIC AND ANAEROBIC Blood Culture adequate volume Performed at [facility] [address] [place], [place] [address]    Culture   Final    NO GROWTH 5 DAYS Performed at [facility], [place] [address], [place], [place] [address]    Report Status 09/10/2021 FINAL  Final  Blood culture (routine x 2)     Status: None   Collection Time: 09/05/21  4:20 PM   Specimen: BLOOD  Result Value Ref Range Status   Specimen Description   Final    BLOOD BLOOD RIGHT FOREARM Performed at [facility] [address] [place], [place] [address]    Special Requests   Final    BOTTLES DRAWN AEROBIC AND ANAEROBIC Blood Culture adequate volume Performed at [facility] [address], [place], [place] [address]    Culture   Final    NO GROWTH 5 DAYS Performed at [facility], [place] [address], [place], [place] [address]    Report Status 09/10/2021 FINAL  Final  MRSA Next Gen by PCR, Nasal     Status: None   Collection Time: 09/07/21 10:41 AM   Specimen: Nasal Mucosa; Nasal Swab  Result Value Ref Range Status   MRSA by PCR Next Gen NOT DETECTED NOT DETECTED Final    Comment: (NOTE) The GeneXpert MRSA Assay (FDA approved for NASAL specimens only), is one component of a comprehensive MRSA colonization surveillance program. It is not intended to diagnose MRSA infection nor to guide or monitor treatment for MRSA  infections. Test performance is not FDA approved in patients less than 50 years old. Performed at [facility], [place] [address], [place],  [address]   SARS Coronavirus 2 by RT PCR (hospital order, performed in [facility] hospital lab) *cepheid single result test* Anterior Nasal Swab     Status: None   Collection Time: 09/08/21 11:25 AM   Specimen: Anterior Nasal Swab  Result Value Ref Range Status   SARS Coronavirus 2 by RT PCR NEGATIVE NEGATIVE Final    Comment: (NOTE) SARS-CoV-2 target nucleic acids are NOT DETECTED.  The SARS-CoV-2 RNA is generally detectable in upper and lower respiratory specimens during the acute phase of infection. The lowest concentration of SARS-CoV-2 viral copies this assay can detect is 250 copies / mL. A negative result does not preclude SARS-CoV-2 infection and should not be used as the sole basis for treatment or other patient management decisions.  A negative result may occur with improper specimen collection / handling, submission of specimen other than nasopharyngeal swab, presence of viral mutation(s) within the areas targeted by this assay, and inadequate number of viral copies (<250 copies / mL). A negative result must be combined with clinical observations, patient history, and epidemiological information.  Fact Sheet for Patients:   https://www.patel.info/  Fact Sheet for Healthcare Providers: https://hall.com/  This test  is not yet approved or  cleared by the [place] and has been authorized for detection and/or diagnosis of SARS-CoV-2 by FDA under an Emergency Use Authorization (EUA).  This EUA will remain in effect (meaning this test can be used) for the duration of the COVID-19 declaration under Section 564(b)(1) of the Act, 21 U.S.C. section 360bbb-3(b)(1), unless the authorization is terminated or revoked sooner.  Performed at [facility],  [place] [address], [place], [place] [address]   Expectorated Sputum Assessment w Gram Stain, Rflx to Resp Cult     Status: None   Collection Time: 09/09/21  8:45 AM   Specimen: Expectorated Sputum  Result Value Ref Range Status   Specimen Description EXPECTORATED SPUTUM  Final   Special Requests NONE  Final   Sputum evaluation   Final    Sputum specimen not acceptable for testing.  Please recollect.   Performed at [facility], [place] [address], [place], [place] [address]    Report Status 09/09/2021 FINAL  Final     Time coordinating discharge: 25 minutes  SIGNED: Antonieta Pert, MD  [facility] 09/13/2021, 1:20 PM  If 7PM-7AM, please contact night-coverage [email or web address]

## 2021-09-13 NOTE — Progress Notes (Signed)
Discharge instructions reviewed with patient. All questions and concerns addressed. IV removed per protocol, tolerated well, intact. All belongings given.

## 2021-09-13 NOTE — Progress Notes (Signed)
Subjective: Feeling better than yesterday   Antibiotics:  Anti-infectives (From admission, onward)    Start     Dose/Rate Route Frequency Ordered Stop   09/13/21 1400  cefadroxil (DURICEF) capsule 1,000 mg        1,000 mg Oral 2 times daily 09/13/21 1300     09/13/21 0000  cefadroxil (DURICEF) 500 MG capsule        1,000 mg Oral 2 times daily 09/13/21 1304 09/18/21 2359   09/12/21 1400  amoxicillin (AMOXIL) capsule 500 mg        500 mg Oral  Once 09/12/21 1103 09/12/21 1402   09/09/21 1600  ceFAZolin (ANCEF) IVPB 2g/100 mL premix  Status:  Discontinued        2 g 200 mL/hr over 30 Minutes Intravenous Every 8 hours 09/09/21 1521 09/13/21 1300   09/06/21 1800  vancomycin (VANCOREADY) IVPB 1500 mg/300 mL  Status:  Discontinued        1,500 mg 150 mL/hr over 120 Minutes Intravenous Every 24 hours 09/05/21 1721 09/06/21 1056   09/06/21 1600  cefTRIAXone (ROCEPHIN) 2 g in sodium chloride 0.9 % 100 mL IVPB  Status:  Discontinued        2 g 200 mL/hr over 30 Minutes Intravenous Every 24 hours 09/05/21 1706 09/09/21 1515   09/06/21 1200  vancomycin (VANCOCIN) IVPB 1000 mg/200 mL premix  Status:  Discontinued        1,000 mg 200 mL/hr over 60 Minutes Intravenous Every 12 hours 09/06/21 1057 09/09/21 1515   09/05/21 1730  vancomycin (VANCOREADY) IVPB 2000 mg/400 mL        2,000 mg 200 mL/hr over 120 Minutes Intravenous  Once 09/05/21 1720 09/05/21 2057   09/05/21 1415  cefTRIAXone (ROCEPHIN) 2 g in sodium chloride 0.9 % 100 mL IVPB        2 g 200 mL/hr over 30 Minutes Intravenous  Once 09/05/21 1406 09/05/21 1655   09/05/21 1345  cefTRIAXone (ROCEPHIN) injection 2 g  Status:  Discontinued        2 g Intramuscular  Once 09/05/21 1344 09/05/21 1406       Medications: Scheduled Meds:  cefadroxil  1,000 mg Oral BID   enoxaparin (LOVENOX) injection  60 mg Subcutaneous Q24H   guaiFENesin  600 mg Oral BID   levothyroxine  175 mcg Oral QAC breakfast   loratadine  10 mg Oral  Daily   pantoprazole  40 mg Oral Daily   rosuvastatin  5 mg Oral QHS   senna-docusate  1 tablet Oral QHS   Continuous Infusions:   PRN Meds:.acetaminophen **OR** acetaminophen, diphenhydrAMINE, EPINEPHrine, fluticasone, morphine injection, ondansetron **OR** ondansetron (ZOFRAN) IV, oxyCODONE, senna-docusate, traZODone    Objective: Weight change:   Intake/Output Summary (Last 24 hours) at 09/13/2021 1322 Last data filed at 09/13/2021 0600 Gross per 24 hour  Intake 820 ml  Output --  Net 820 ml    Blood pressure 129/69, pulse 60, temperature 98.7 F (37.1 C), temperature source Oral, resp. rate 18, height 5' 10.5" (1.791 m), weight 117.5 kg, SpO2 98 %. Temp:  [98.3 F (36.8 C)-99.2 F (37.3 C)] 98.7 F (37.1 C) (07/25 0546) Pulse Rate:  [60-72] 60 (07/25 0546) Resp:  [17-18] 18 (07/25 0546) BP: (127-148)/(62-89) 129/69 (07/25 0546) SpO2:  [93 %-98 %] 98 % (07/25 0800)  Physical Exam: Physical Exam Constitutional:      Appearance: He is well-developed.  HENT:     Head: Normocephalic and atraumatic.  Eyes:     Conjunctiva/sclera: Conjunctivae normal.  Cardiovascular:     Rate and Rhythm: Normal rate and regular rhythm.  Pulmonary:     Effort: Pulmonary effort is normal. No respiratory distress.     Breath sounds: Normal breath sounds. No stridor. No wheezing.  Abdominal:     General: There is no distension.     Palpations: Abdomen is soft.  Musculoskeletal:        General: Swelling present. Normal range of motion.     Cervical back: Normal range of motion and neck supple.     Left lower leg: Edema present.  Skin:    General: Skin is warm and dry.     Findings: Erythema present. No rash.  Neurological:     General: No focal deficit present.     Mental Status: He is alert and oriented to person, place, and time.  Psychiatric:        Mood and Affect: Mood normal.        Behavior: Behavior normal.        Thought Content: Thought content normal.        Judgment:  Judgment normal.     Cellulitic area 09/12/2021:    09/13/2021:     CBC:    BMET Recent Labs    09/11/21 0328 09/12/21 0313  NA 141 141  K 3.9 4.1  CL 105 106  CO2 27 29  GLUCOSE 105* 105*  BUN 11 12  CREATININE 0.92 0.98  CALCIUM 8.9 8.6*      Liver Panel  No results for input(s): "PROT", "ALBUMIN", "AST", "ALT", "ALKPHOS", "BILITOT", "BILIDIR", "IBILI" in the last 72 hours.     Sedimentation Rate Recent Labs    09/11/21 0328  ESRSEDRATE 86*    C-Reactive Protein Recent Labs    09/11/21 0328  CRP 4.2*     Micro Results: Recent Results (from the past 720 hour(s))  Blood culture (routine x 2)     Status: None   Collection Time: 09/05/21  1:44 PM   Specimen: BLOOD  Result Value Ref Range Status   Specimen Description   Final    BLOOD LEFT ANTECUBITAL Performed at [facility] [address], [place], [place] [address]    Special Requests   Final    BOTTLES DRAWN AEROBIC AND ANAEROBIC Blood Culture adequate volume Performed at [facility] [address], [place], [place] [address]    Culture   Final    NO GROWTH 5 DAYS Performed at [facility], [name] [address] [place], [place] [address]    Report Status 09/10/2021 FINAL  Final  Blood culture (routine x 2)     Status: None   Collection Time: 09/05/21  4:20 PM   Specimen: BLOOD  Result Value Ref Range Status   Specimen Description   Final    BLOOD BLOOD RIGHT FOREARM Performed at [facility] [address], [place], [place] [address]    Special Requests   Final    BOTTLES DRAWN AEROBIC AND ANAEROBIC Blood Culture adequate volume Performed at [facility] [address] [place], [place] [address]    Culture   Final    NO GROWTH 5 DAYS Performed at [facility], [place] [address] [place], [place] [address]    Report Status 09/10/2021 FINAL  Final  MRSA Next Gen by PCR, Nasal     Status: None    Collection Time: 09/07/21  10:41 AM   Specimen: Nasal Mucosa; Nasal Swab  Result Value Ref Range Status   MRSA by PCR Next Gen NOT DETECTED NOT DETECTED Final    Comment: (NOTE) The GeneXpert MRSA Assay (FDA approved for NASAL specimens only), is one component of a comprehensive MRSA colonization surveillance program. It is not intended to diagnose MRSA infection nor to guide or monitor treatment for MRSA infections. Test performance is not FDA approved in patients less than 62 years old. Performed at [facility], [place] [address] [place], [place] [address]   SARS Coronavirus 2 by RT PCR (hospital order, performed in [facility] hospital lab) *cepheid single result test* Anterior Nasal Swab     Status: None   Collection Time: 09/08/21 11:25 AM   Specimen: Anterior Nasal Swab  Result Value Ref Range Status   SARS Coronavirus 2 by RT PCR NEGATIVE NEGATIVE Final    Comment: (NOTE) SARS-CoV-2 target nucleic acids are NOT DETECTED.  The SARS-CoV-2 RNA is generally detectable in upper and lower respiratory specimens during the acute phase of infection. The lowest concentration of SARS-CoV-2 viral copies this assay can detect is 250 copies / mL. A negative result does not preclude SARS-CoV-2 infection and should not be used as the sole basis for treatment or other patient management decisions.  A negative result may occur with improper specimen collection / handling, submission of specimen other than nasopharyngeal swab, presence of viral mutation(s) within the areas targeted by this assay, and inadequate number of viral copies (<250 copies / mL). A negative result must be combined with clinical observations, patient history, and epidemiological information.  Fact Sheet for Patients:   https://www.patel.info/  Fact Sheet for Healthcare Providers: https://hall.com/  This test is not yet approved or  cleared by the [place] FDA and has been authorized for detection and/or diagnosis of SARS-CoV-2 by FDA under an Emergency Use Authorization (EUA).  This EUA will remain in effect (meaning this test can be used) for the duration of the COVID-19 declaration under Section 564(b)(1) of the Act, 21 U.S.C. section 360bbb-3(b)(1), unless the authorization is terminated or revoked sooner.  Performed at [facility], [place] [address] [place], [place] [address]   Expectorated Sputum Assessment w Gram Stain, Rflx to Resp Cult     Status: None   Collection Time: 09/09/21  8:45 AM   Specimen: Expectorated Sputum  Result Value Ref Range Status   Specimen Description EXPECTORATED SPUTUM  Final   Special Requests NONE  Final   Sputum evaluation   Final    Sputum specimen not acceptable for testing.  Please recollect.   Performed at [facility], [place] [address] [place], [place] [address]    Report Status 09/09/2021 FINAL  Final    Studies/Results: No results found.    Assessment/Plan:  INTERVAL HISTORY: Patient improved clinically  Principal Problem:   Left leg cellulitis Active Problems:   Obesity (BMI 30-39.9)   Essential hypertension   Hyperlipidemia   Hypothyroidism   Cellulitis   Hyponatremia    Jonathan Powers is a 71 y.o. male with patient with nonpurulent cellulitis narrowed to vancomycin and ceftriaxone to cefazolin.  MRI had suggested possibility of early myofascitits done last week though I wonder if that was an overcall.  #1  Cellulitis: He is improving I do not think he has mild fasciitis  He can be switched over to cefadroxil to complete a total of 14 days.  He will be discharged  today.  Happy to see him in the clinic if he needs to be seen certainly if he has another recurrence I would want him to have antibiotics on hand at all times to initiate at the first sign of cellulitis.        LOS: 7 days   Alcide Evener 09/13/2021, 1:22  PM

## 2021-09-14 ENCOUNTER — Telehealth: Payer: Self-pay | Admitting: *Deleted

## 2021-09-14 ENCOUNTER — Encounter: Payer: Self-pay | Admitting: *Deleted

## 2021-09-14 ENCOUNTER — Other Ambulatory Visit (HOSPITAL_COMMUNITY): Payer: Self-pay

## 2021-09-14 ENCOUNTER — Other Ambulatory Visit: Payer: Self-pay | Admitting: Infectious Disease

## 2021-09-14 MED ORDER — CEFADROXIL 500 MG PO CAPS
1000.0000 mg | ORAL_CAPSULE | Freq: Two times a day (BID) | ORAL | 0 refills | Status: AC
  Filled 2021-09-14 (×3): qty 20, 5d supply, fill #0

## 2021-09-14 NOTE — Patient Outreach (Signed)
  Care Coordination [facility] Note Transition Care Management Follow-up Telephone Call Date of discharge and from where: 09/13/21 [facility] How have you been since you were released from the hospital? "I am doing so-so; I don't have the antibiotics they were supposed to deliver to me before I left the hospital; I have turned my car upside down looking for them, but they are not there and they are not in the bag of belongings I came home with" Any questions or concerns? Yes- as above, patient reports does not have the antibiotics that were prescribed at hospital discharge  Items Reviewed: Did the pt receive and understand the discharge instructions provided? No - reports does not have printed discharge instructions from hospital discharge-- reviewed with patient today Medications obtained and verified? No  Other? No  Any new allergies since your discharge? No  Dietary orders reviewed? Yes Do you have support at home? Yes   Home Care and Equipment/Supplies: Were home health services ordered? no If so, what is the name of the agency? N/A  Has the agency set up a time to come to the patient's home? not applicable Were any new equipment or medical supplies ordered?  No What is the name of the medical supply agency? N/A Were you able to get the supplies/equipment? not applicable Do you have any questions related to the use of the equipment or supplies? No - N/A  Functional Questionnaire: (I = Independent and D = Dependent) ADLs: I  Bathing/Dressing- I  Meal Prep- I  Eating- I  Maintaining continence- I  Transferring/Ambulation- I  Managing Meds- I  Follow up appointments reviewed:  PCP Hospital f/u appt confirmed? No  Scheduled to see - on - @ -- no scheduled PCP office visit noted [facility] f/u appt confirmed? No  Scheduled to see - on - @ -- no scheduled ID provider office visit Are transportation arrangements needed? No  If their condition worsens, is the pt aware to call  PCP or go to the Emergency Dept.? Yes Was the patient provided with contact information for the PCP's office or ED? Yes Was to pt encouraged to call back with questions or concerns? Yes  SDOH assessments and interventions completed:   Yes  Care Coordination Interventions Activated:  Yes Care Coordination Interventions:  PCP follow up appointment requested; contacted [facility] pharmacy to follow up on post-hospital discharge antibiotic and then contacted Dr. Tommy Medal and facilitated new prescription be ordered: this was accomplished, patient aware to pick up new prescription at [facility] and was provided phone number for [facility]; reviewed hospital discharge instructions with patient and confirmed he has good understanding of same  Encounter Outcome:  Pt. Visit Completed  Oneta Rack, RN, BSN, [facility] Coordination [phone]: direct office

## 2021-09-15 ENCOUNTER — Other Ambulatory Visit (HOSPITAL_COMMUNITY): Payer: Self-pay

## 2021-09-15 ENCOUNTER — Ambulatory Visit (INDEPENDENT_AMBULATORY_CARE_PROVIDER_SITE_OTHER): Payer: Medicare Other | Admitting: Internal Medicine

## 2021-09-15 ENCOUNTER — Encounter: Payer: Self-pay | Admitting: Internal Medicine

## 2021-09-15 VITALS — BP 122/66 | HR 63 | Temp 98.4°F | Ht 70.5 in | Wt 260.5 lb

## 2021-09-15 DIAGNOSIS — D539 Nutritional anemia, unspecified: Secondary | ICD-10-CM

## 2021-09-15 DIAGNOSIS — L03116 Cellulitis of left lower limb: Secondary | ICD-10-CM

## 2021-09-15 DIAGNOSIS — D75838 Other thrombocytosis: Secondary | ICD-10-CM | POA: Diagnosis not present

## 2021-09-15 LAB — IBC + FERRITIN
Ferritin: 184.2 ng/mL (ref 22.0–322.0)
Iron: 45 ug/dL (ref 42–165)
Saturation Ratios: 14.9 % — ABNORMAL LOW (ref 20.0–50.0)
TIBC: 302.4 ug/dL (ref 250.0–450.0)
Transferrin: 216 mg/dL (ref 212.0–360.0)

## 2021-09-15 LAB — CBC WITH DIFFERENTIAL/PLATELET
Basophils Absolute: 0 10*3/uL (ref 0.0–0.1)
Basophils Relative: 0.6 % (ref 0.0–3.0)
Eosinophils Absolute: 0.1 10*3/uL (ref 0.0–0.7)
Eosinophils Relative: 0.9 % (ref 0.0–5.0)
HCT: 40 % (ref 39.0–52.0)
Hemoglobin: 13.6 g/dL (ref 13.0–17.0)
Lymphocytes Relative: 23.6 % (ref 12.0–46.0)
Lymphs Abs: 2 10*3/uL (ref 0.7–4.0)
MCHC: 34.1 g/dL (ref 30.0–36.0)
MCV: 89.1 fl (ref 78.0–100.0)
Monocytes Absolute: 0.9 10*3/uL (ref 0.1–1.0)
Monocytes Relative: 10.4 % (ref 3.0–12.0)
Neutro Abs: 5.4 10*3/uL (ref 1.4–7.7)
Neutrophils Relative %: 64.5 % (ref 43.0–77.0)
Platelets: 540 10*3/uL — ABNORMAL HIGH (ref 150.0–400.0)
RBC: 4.48 Mil/uL (ref 4.22–5.81)
RDW: 13.7 % (ref 11.5–15.5)
WBC: 8.4 10*3/uL (ref 4.0–10.5)

## 2021-09-15 LAB — C-REACTIVE PROTEIN: CRP: 1.2 mg/dL (ref 0.5–20.0)

## 2021-09-15 NOTE — Progress Notes (Unsigned)
Subjective:  Patient ID: Jonathan Powers, male    DOB: 14-Apr-1950  Age: 71 y.o. MRN: [id number]  CC: No chief complaint on file.   HPI CORNELIUS MARULLO presents for f/up - He was recently admitted for left lower extremity cellulitis.  He was discharged a day ago on a broad-spectrum cephalosporin.  He complains that the area is still painful and swollen.  He is getting adequate symptom relief with Tylenol.  He has chills but denies fever, night sweats, chest pain, or shortness of breath.  Admit date: 09/05/2021 Discharge date: 09/13/2021 Recommendations for Outpatient Follow-up:  Follow up with PCP, ID in 1 weeks-call for appointment Please obtain BMP/CBC in one week   Discharge Dispo: home Discharge Condition: Stable Code Status:   Code Status: DNR Diet recommendation:  Diet Order                  Diet regular Room service appropriate? Yes; Fluid consistency: Thin  Diet effective now                         Brief/Interim Summary: 71 y.o. male with medical history significant of hypertension, hypothyroidism, obstructive sleep apnea on CPAP presented with progressively worsening left lower extremity swelling and redness.  On the presentation, he was found to have left lower extremity cellulitis and started on broad-spectrum antibiotics.  Lower extremity duplex was negative for DVT.  Seen by infectious disease.  MRI showed Diffuse cellulitis without discrete subcutaneous abscess. 2. Mild changes of myofasciitis but no findings for pyomyositis. 3. No findings for septic arthritis or osteomyelitis."  Being managed with IV antibiotics as per ID.  Overall improving plan for discharge home once cleared by ID.  Seen by ID this morning okay to discharge on oral cephalexin that were sent the prescription.  Patient informed to follow-up with PCP in 1 week, or if any worsening of redness swelling pain or fever chills nausea vomiting      Discharge Diagnoses:  Principal Problem:   Left leg  cellulitis Active Problems:   Obesity (BMI 30-39.9)   Essential hypertension   Hyperlipidemia   Hypothyroidism   Cellulitis   Hyponatremia   Left leg cellulitis: Duplex negative for DVT, x-ray negative.MRI suggested possibility of early myofascitis done last week.  ID managing cw/ iv cefazolin-he does have pain but overall improving, seen and cleared by ID for discharge home on oral Keflex.  Outpatient follow-up with instruction provided  Outpatient Medications Prior to Visit  Medication Sig Dispense Refill   acetaminophen (TYLENOL) 500 MG tablet Take 1,000 mg by mouth every 6 (six) hours as needed for moderate pain.      cefadroxil (DURICEF) 500 MG capsule Take 2 capsules (1,000 mg total) by mouth 2 (two) times daily for 5 days. 20 capsule 0   cetirizine (ZYRTEC) 10 MG tablet Take 10 mg by mouth daily.     esomeprazole (NEXIUM) 40 MG capsule Take 1 capsule (40 mg total) by mouth daily. 90 capsule 1   fluticasone (FLONASE) 50 MCG/ACT nasal spray Place 1 spray into both nostrils daily as needed for allergies.     ketoconazole (NIZORAL) 2 % cream Apply 1 Application topically daily as needed for irritation (rash).     levothyroxine (SYNTHROID) 175 MCG tablet Take 1 tablet (175 mcg total) by mouth daily before breakfast. 90 tablet 0   olmesartan-hydrochlorothiazide (BENICAR HCT) 40-25 MG tablet Take 1 tablet by mouth daily. As directed 90 tablet  3   rosuvastatin (CRESTOR) 5 MG tablet TAKE ONE TABLET BY MOUTH DAILY AT BEDTIME (Patient taking differently: Take 5 mg by mouth daily.) 90 tablet 1   tadalafil (CIALIS) 5 MG tablet Take 5 mg by mouth daily as needed for erectile dysfunction.     traZODone (DESYREL) 100 MG tablet TAKE 1 TABLET BY MOUTH AT BEDTIME AS NEEDED FOR SLEEP. 90 tablet 1   valACYclovir (VALTREX) 1000 MG tablet Take 1 tablet (1,000 mg total) by mouth 2 (two) times daily. (Patient taking differently: Take 1,000 mg by mouth 2 (two) times daily as needed (outbreak prevention).) 30  tablet 0   No facility-administered medications prior to visit.    ROS Review of Systems  Constitutional:  Positive for chills. Negative for fatigue and fever.  HENT: Negative.    Eyes: Negative.   Respiratory:  Negative for cough, chest tightness, shortness of breath and wheezing.   Cardiovascular:  Negative for chest pain, palpitations and leg swelling.  Gastrointestinal:  Negative for abdominal pain, constipation, diarrhea, nausea and vomiting.  Endocrine: Negative.   Genitourinary: Negative.  Negative for difficulty urinating.  Musculoskeletal:  Positive for arthralgias. Negative for myalgias.  Skin:  Positive for color change and rash.  Neurological: Negative.  Negative for dizziness, weakness and headaches.  Hematological:  Negative for adenopathy. Does not bruise/bleed easily.  Psychiatric/Behavioral: Negative.      Objective:  BP 122/66   Pulse 63   Temp 98.4 F (36.9 C) (Oral)   Ht 5' 10.5" (1.791 m)   Wt 260 lb 8 oz (118.2 kg)   SpO2 93%   BMI 36.85 kg/m   BP Readings from Last 3 Encounters:  09/15/21 122/66  09/13/21 119/61  07/07/21 126/70    Wt Readings from Last 3 Encounters:  09/15/21 260 lb 8 oz (118.2 kg)  09/12/21 259 lb 1.6 oz (117.5 kg)  07/07/21 266 lb 3.2 oz (120.7 kg)    Physical Exam Vitals reviewed.  Constitutional:      Appearance: He is not ill-appearing.  HENT:     Nose: Nose normal.     Mouth/Throat:     Mouth: Mucous membranes are moist.  Eyes:     General: No scleral icterus.    Conjunctiva/sclera: Conjunctivae normal.  Cardiovascular:     Rate and Rhythm: Normal rate and regular rhythm.     Pulses:          Carotid pulses are 1+ on the right side and 1+ on the left side.      Radial pulses are 1+ on the right side and 1+ on the left side.       Femoral pulses are 1+ on the right side and 1+ on the left side.      Popliteal pulses are 1+ on the right side and 1+ on the left side.       Dorsalis pedis pulses are 1+ on the  right side and 1+ on the left side.       Posterior tibial pulses are 1+ on the right side and 1+ on the left side.     Heart sounds: No murmur heard. Pulmonary:     Effort: Pulmonary effort is normal.     Breath sounds: No stridor. No wheezing, rhonchi or rales.  Abdominal:     Palpations: There is no mass.     Tenderness: There is no abdominal tenderness. There is no guarding.     Hernia: No hernia is present.  Musculoskeletal:  General: No swelling.     Cervical back: Neck supple.     Right lower leg: No edema.     Left lower leg: Edema present.     Comments: Left lower leg-positive for erythema, warmth, edema, and peeling.  There is no proximal streaking.  Lymphadenopathy:     Cervical: No cervical adenopathy.  Neurological:     General: No focal deficit present.     Mental Status: He is alert and oriented to person, place, and time.     Lab Results  Component Value Date   WBC 8.4 09/15/2021   HGB 13.6 09/15/2021   HCT 40.0 09/15/2021   PLT 540.0 (H) 09/15/2021   GLUCOSE 105 (H) 09/12/2021   CHOL 133 02/09/2021   TRIG 145.0 02/09/2021   HDL 42.50 02/09/2021   LDLCALC 62 02/09/2021   ALT 22 09/06/2021   AST 24 09/06/2021   NA 141 09/12/2021   K 4.1 09/12/2021   CL 106 09/12/2021   CREATININE 0.98 09/12/2021   BUN 12 09/12/2021   CO2 29 09/12/2021   TSH 1.24 02/09/2021   PSA 0.880 08/30/2020   INR 1.0 03/05/2019   HGBA1C 5.3 09/08/2021    VAS [place] LOWER EXTREMITY VENOUS (DVT) (ONLY [facility] & [facility])  Result Date: 09/05/2021  Lower Venous DVT Study Patient Name:  KOLBEE BOGUSZ  Date of Exam:   09/05/2021 Medical Rec #: [id number]         Accession #:    [id number] Date of Birth: 14-Nov-1950         Patient Gender: M Patient Age:   22 years Exam Location:  [facility] Procedure:      VAS [place] LOWER EXTREMITY VENOUS (DVT) Referring Phys: LESLIE SOFIA --------------------------------------------------------------------------------  Indications: Swelling.  Risk  Factors: None identified. Limitations: Poor ultrasound/tissue interface. Comparison Study: No prior studies. Performing Technologist: Oliver Hum RVT  Examination Guidelines: A complete evaluation includes B-mode imaging, spectral Doppler, color Doppler, and power Doppler as needed of all accessible portions of each vessel. Bilateral testing is considered an integral part of a complete examination. Limited examinations for reoccurring indications may be performed as noted. The reflux portion of the exam is performed with the patient in reverse Trendelenburg.  +-----+---------------+---------+-----------+----------+--------------+ RIGHTCompressibilityPhasicitySpontaneityPropertiesThrombus Aging +-----+---------------+---------+-----------+----------+--------------+ CFV  Full           Yes      Yes                                 +-----+---------------+---------+-----------+----------+--------------+   +---------+---------------+---------+-----------+----------+-------------------+ LEFT     CompressibilityPhasicitySpontaneityPropertiesThrombus Aging      +---------+---------------+---------+-----------+----------+-------------------+ CFV      Full           Yes      Yes                                      +---------+---------------+---------+-----------+----------+-------------------+ SFJ      Full                                                             +---------+---------------+---------+-----------+----------+-------------------+ FV Prox  Full                                                             +---------+---------------+---------+-----------+----------+-------------------+  FV Mid   Full                                                             +---------+---------------+---------+-----------+----------+-------------------+ FV Distal               Yes      Yes                                       +---------+---------------+---------+-----------+----------+-------------------+ PFV      Full                                                             +---------+---------------+---------+-----------+----------+-------------------+ POP      Full           Yes      Yes                                      +---------+---------------+---------+-----------+----------+-------------------+ PTV      Full                                                             +---------+---------------+---------+-----------+----------+-------------------+ PERO                                                  Not well visualized +---------+---------------+---------+-----------+----------+-------------------+    Summary: RIGHT: - No evidence of common femoral vein obstruction.  LEFT: - There is no evidence of deep vein thrombosis in the lower extremity. However, portions of this examination were limited- see technologist comments above.  - A cystic structure, measuring 2.2 cm high by 2.9 cm wide by greater than 5.6 cm long, is found in the popliteal fossa.  *See table(s) above for measurements and observations. Electronically signed by Servando Snare MD on 09/05/2021 at 4:23:20 PM.    Final    MR TIBIA FIBULA LEFT W WO CONTRAST  Result Date: 09/08/2021 CLINICAL DATA:  Diffuse lower extremity swelling and redness. EXAM: MRI OF LOWER LEFT EXTREMITY WITHOUT AND WITH CONTRAST TECHNIQUE: Multiplanar, multisequence MR imaging of the left lower extremity was performed both before and after administration of intravenous contrast. CONTRAST:  8m GADAVIST GADOBUTROL 1 MMOL/ML IV SOLN COMPARISON:  None Available. FINDINGS: Diffuse and marked subcutaneous soft tissue swelling/edema/fluid and skin thickening consistent with severe cellulitis. No focal rim enhancing fluid collection to suggest a drainable abscess. There are also mild changes of myofasciitis most notably involving the interosseous membrane and  posterior tibialis muscle but no findings for pyomyositis. Small knee joint effusion but no findings suspicious for septic arthritis at the knee or ankle joint. No  findings for osteomyelitis. IMPRESSION: 1. Diffuse cellulitis without discrete subcutaneous abscess. 2. Mild changes of myofasciitis but no findings for pyomyositis. 3. No findings for septic arthritis or osteomyelitis. Electronically Signed   By: Marijo Sanes M.D.   On: 09/08/2021 19:05   DG Tibia/Fibula Left  Result Date: 09/08/2021 CLINICAL DATA:  Pain and swelling EXAM: LEFT TIBIA AND FIBULA - 2 VIEW COMPARISON:  None Available. FINDINGS: No recent fracture or dislocation is seen. There are no focal lytic lesions. There is previous surgical fusion of multiple tarsals in the posterior aspect of the ankle. Degenerative changes are noted in left knee with bony spurs, more so in the lateral compartment. There is diffuse edema in subcutaneous plane. There is soft tissue swelling around the ankle, more so over the lateral malleolus. IMPRESSION: No recent fracture or dislocation is seen. There are no focal lytic lesions. Degenerative changes are noted in left knee, mostly in the lateral compartment. Electronically Signed   By: Elmer Picker M.D.   On: 09/08/2021 13:33   DG Chest 2 View  Result Date: 09/08/2021 CLINICAL DATA:  Cough, shortness of breath, no chest pain EXAM: CHEST - 2 VIEW COMPARISON:  06/25/2021 FINDINGS: The heart size and mediastinal contours are within normal limits. Both lungs are clear. The visualized skeletal structures are unremarkable. IMPRESSION: No active cardiopulmonary disease. Electronically Signed   By: Kathreen Devoid M.D.   On: 09/08/2021 13:32   VAS [place] LOWER EXTREMITY VENOUS (DVT) (ONLY [facility] & [facility])  Result Date: 09/05/2021  Lower Venous DVT Study Patient Name:  Jonathan Powers  Date of Exam:   09/05/2021 Medical Rec #: [id number]         Accession #:    [id number] Date of Birth: 1950/08/09         Patient Gender: M  Patient Age:   25 years Exam Location:  [facility] Procedure:      VAS [place] LOWER EXTREMITY VENOUS (DVT) Referring Phys: LESLIE SOFIA --------------------------------------------------------------------------------  Indications: Swelling.  Risk Factors: None identified. Limitations: Poor ultrasound/tissue interface. Comparison Study: No prior studies. Performing Technologist: Oliver Hum RVT  Examination Guidelines: A complete evaluation includes B-mode imaging, spectral Doppler, color Doppler, and power Doppler as needed of all accessible portions of each vessel. Bilateral testing is considered an integral part of a complete examination. Limited examinations for reoccurring indications may be performed as noted. The reflux portion of the exam is performed with the patient in reverse Trendelenburg.  +-----+---------------+---------+-----------+----------+--------------+ RIGHTCompressibilityPhasicitySpontaneityPropertiesThrombus Aging +-----+---------------+---------+-----------+----------+--------------+ CFV  Full           Yes      Yes                                 +-----+---------------+---------+-----------+----------+--------------+   +---------+---------------+---------+-----------+----------+-------------------+ LEFT     CompressibilityPhasicitySpontaneityPropertiesThrombus Aging      +---------+---------------+---------+-----------+----------+-------------------+ CFV      Full           Yes      Yes                                      +---------+---------------+---------+-----------+----------+-------------------+ SFJ      Full                                                             +---------+---------------+---------+-----------+----------+-------------------+  FV Prox  Full                                                             +---------+---------------+---------+-----------+----------+-------------------+ FV Mid   Full                                                              +---------+---------------+---------+-----------+----------+-------------------+ FV Distal               Yes      Yes                                      +---------+---------------+---------+-----------+----------+-------------------+ PFV      Full                                                             +---------+---------------+---------+-----------+----------+-------------------+ POP      Full           Yes      Yes                                      +---------+---------------+---------+-----------+----------+-------------------+ PTV      Full                                                             +---------+---------------+---------+-----------+----------+-------------------+ PERO                                                  Not well visualized +---------+---------------+---------+-----------+----------+-------------------+    Summary: RIGHT: - No evidence of common femoral vein obstruction.  LEFT: - There is no evidence of deep vein thrombosis in the lower extremity. However, portions of this examination were limited- see technologist comments above.  - A cystic structure, measuring 2.2 cm high by 2.9 cm wide by greater than 5.6 cm long, is found in the popliteal fossa.  *See table(s) above for measurements and observations. Electronically signed by Servando Snare MD on 09/05/2021 at 4:23:20 PM.    Final    CT Chest Wo Contrast  Result Date: 08/04/2021 CLINICAL DATA:  Lung nodule. EXAM: CT CHEST WITHOUT CONTRAST TECHNIQUE: Multidetector CT imaging of the chest was performed following the standard protocol without IV contrast. RADIATION DOSE REDUCTION: This exam was performed according to the departmental dose-optimization program which includes automated exposure control, adjustment of the mA and/or kV according to patient size and/or use of  iterative reconstruction technique. COMPARISON:  Chest x-ray 06/25/2021. CT  chest abdomen and pelvis 04/06/2021. FINDINGS: Cardiovascular: No significant vascular findings. Normal heart size. No pericardial effusion. Mediastinum/Nodes: No enlarged mediastinal or axillary lymph nodes. Thyroid gland, trachea, and esophagus demonstrate no significant findings. Lungs/Pleura: There is a new 3 mm right upper lobe pulmonary nodule image 8/40. Stable 6 mm left apical nodule image 8/24. Stable 6 mm nodule in the superior segment of the left lower lobe image 8/56. lungs are otherwise clear. No pleural effusion or pneumothorax. Upper Abdomen: Gallbladder surgically absent. Musculoskeletal: Multilevel degenerative changes affect the spine. No acute fractures are seen. IMPRESSION: 1. New 3 mm right upper lobe pulmonary nodule. Although likely benign, given that this is new from the prior examination a non-contrast chest CT can be considered in 12 months.This recommendation follows the consensus statement: Guidelines for Management of Incidental Pulmonary Nodules Detected on CT Images: From the Fleischner Society 2017; Radiology 2017; 284:228-243. 2. Otherwise stable 6 mm pulmonary nodules in the left lung. Electronically Signed   By: Ronney Asters M.D.   On: 08/04/2021 20:42   DG Chest 2 View  Result Date: 06/25/2021 CLINICAL DATA:  Cough and shortness of breath for 2 days. EXAM: CHEST - 2 VIEW COMPARISON:  03/11/2021 and prior studies FINDINGS: This is a low volume study. The cardiomediastinal silhouette is unremarkable. Mild peribronchial thickening again noted. There is no evidence of focal airspace disease, pulmonary edema, suspicious pulmonary nodule/mass, pleural effusion, or pneumothorax. No acute bony abnormalities are identified. IMPRESSION: No active cardiopulmonary disease. Electronically Signed   By: Margarette Canada M.D.   On: 06/25/2021 12:41      Assessment & Plan:   Diagnoses and all orders for this visit:  Left leg cellulitis- By objective measures this is improving on the current  cephalosporin.  Will continue.  His labs are reassuring. -     CBC with Differential/Platelet; Future -     C-reactive protein; Future -     C-reactive protein -     CBC with Differential/Platelet  Deficiency anemia- His H&H and iron level are normal now. -     CBC with Differential/Platelet; Future -     IBC + Ferritin; Future -     IBC + Ferritin -     CBC with Differential/Platelet  Reactive thrombocytosis- This is stable. -     CBC with Differential/Platelet; Future -     IBC + Ferritin; Future -     IBC + Ferritin -     CBC with Differential/Platelet   I am having Dilyn Smiles. Sorbello maintain his cetirizine, acetaminophen, tadalafil, valACYclovir, olmesartan-hydrochlorothiazide, esomeprazole, levothyroxine, rosuvastatin, traZODone, fluticasone, ketoconazole, and cefadroxil.  No orders of the defined types were placed in this encounter.    Follow-up: Return in about 1 week (around 09/22/2021).  Scarlette Calico, MD

## 2021-09-15 NOTE — Patient Instructions (Signed)

## 2021-09-17 DIAGNOSIS — D75838 Other thrombocytosis: Secondary | ICD-10-CM | POA: Insufficient documentation

## 2021-09-17 DIAGNOSIS — D539 Nutritional anemia, unspecified: Secondary | ICD-10-CM | POA: Insufficient documentation

## 2021-09-17 HISTORY — DX: Other thrombocytosis: D75.838

## 2021-09-21 ENCOUNTER — Encounter: Payer: Self-pay | Admitting: Internal Medicine

## 2021-09-22 ENCOUNTER — Encounter: Payer: Self-pay | Admitting: Internal Medicine

## 2021-09-22 ENCOUNTER — Ambulatory Visit (INDEPENDENT_AMBULATORY_CARE_PROVIDER_SITE_OTHER): Payer: Medicare Other | Admitting: Internal Medicine

## 2021-09-22 VITALS — BP 114/68 | HR 62 | Temp 98.1°F | Ht 70.5 in | Wt 252.0 lb

## 2021-09-22 DIAGNOSIS — L03116 Cellulitis of left lower limb: Secondary | ICD-10-CM

## 2021-09-22 MED ORDER — AMOXICILLIN-POT CLAVULANATE 875-125 MG PO TABS: 1.0000 | ORAL_TABLET | Freq: Two times a day (BID) | ORAL | 0 refills | Status: AC

## 2021-09-22 MED ORDER — SULFAMETHOXAZOLE-TRIMETHOPRIM 800-160 MG PO TABS: 1.0000 | ORAL_TABLET | Freq: Two times a day (BID) | ORAL | 0 refills | Status: AC

## 2021-09-22 NOTE — Progress Notes (Signed)
Subjective:  Patient ID: Jonathan Powers, male    DOB: 19-Nov-1950  Age: 71 y.o. MRN: [id number]  CC: Recurrent Skin Infections   HPI Jonathan Powers presents for f/up -  He has finished the cephalosporin antibiotic and continues to be concerned about redness, swelling, and pain in his left lower extremity.  He denies fever or chills.  He is controlling the pain with Tylenol.  Outpatient Medications Prior to Visit  Medication Sig Dispense Refill   acetaminophen (TYLENOL) 500 MG tablet Take 1,000 mg by mouth every 6 (six) hours as needed for moderate pain.      cetirizine (ZYRTEC) 10 MG tablet Take 10 mg by mouth daily.     esomeprazole (NEXIUM) 40 MG capsule Take 1 capsule (40 mg total) by mouth daily. 90 capsule 1   fluticasone (FLONASE) 50 MCG/ACT nasal spray Place 1 spray into both nostrils daily as needed for allergies.     ketoconazole (NIZORAL) 2 % cream Apply 1 Application topically daily as needed for irritation (rash).     levothyroxine (SYNTHROID) 175 MCG tablet Take 1 tablet (175 mcg total) by mouth daily before breakfast. 90 tablet 0   olmesartan-hydrochlorothiazide (BENICAR HCT) 40-25 MG tablet Take 1 tablet by mouth daily. As directed 90 tablet 3   rosuvastatin (CRESTOR) 5 MG tablet TAKE ONE TABLET BY MOUTH DAILY AT BEDTIME (Patient taking differently: Take 5 mg by mouth daily.) 90 tablet 1   tadalafil (CIALIS) 5 MG tablet Take 5 mg by mouth daily as needed for erectile dysfunction.     traZODone (DESYREL) 100 MG tablet TAKE 1 TABLET BY MOUTH AT BEDTIME AS NEEDED FOR SLEEP. 90 tablet 1   valACYclovir (VALTREX) 1000 MG tablet Take 1 tablet (1,000 mg total) by mouth 2 (two) times daily. (Patient taking differently: Take 1,000 mg by mouth 2 (two) times daily as needed (outbreak prevention).) 30 tablet 0   No facility-administered medications prior to visit.    ROS Review of Systems  Constitutional: Negative.  Negative for chills, diaphoresis, fatigue and fever.  HENT:  Negative.    Eyes: Negative.   Respiratory:  Negative for cough and shortness of breath.   Cardiovascular:  Negative for chest pain, palpitations and leg swelling.  Gastrointestinal:  Negative for abdominal pain, diarrhea and nausea.  Endocrine: Negative.   Genitourinary: Negative.  Negative for difficulty urinating.  Musculoskeletal: Negative.  Negative for arthralgias.  Skin:  Positive for color change and rash.  Neurological: Negative.  Negative for dizziness and weakness.  Hematological:  Negative for adenopathy. Does not bruise/bleed easily.  Psychiatric/Behavioral: Negative.      Objective:  BP 114/68 (BP Location: Right Arm, Patient Position: Sitting, Cuff Size: Large)   Pulse 62   Temp 98.1 F (36.7 C) (Oral)   Ht 5' 10.5" (1.791 m)   Wt 252 lb (114.3 kg)   SpO2 96%   BMI 35.65 kg/m   BP Readings from Last 3 Encounters:  09/22/21 114/68  09/15/21 122/66  07/07/21 126/70    Wt Readings from Last 3 Encounters:  09/22/21 252 lb (114.3 kg)  09/15/21 260 lb 8 oz (118.2 kg)  07/07/21 266 lb 3.2 oz (120.7 kg)    Physical Exam Vitals reviewed.  Constitutional:      General: He is not in acute distress.    Appearance: He is not toxic-appearing.  Eyes:     Conjunctiva/sclera: Conjunctivae normal.  Cardiovascular:     Rate and Rhythm: Normal rate and regular rhythm.  Heart sounds: No murmur heard. Pulmonary:     Effort: Pulmonary effort is normal.     Breath sounds: No stridor. No wheezing, rhonchi or rales.  Abdominal:     General: Abdomen is flat.     Palpations: There is no mass.     Tenderness: There is no abdominal tenderness. There is no guarding.     Hernia: No hernia is present.  Musculoskeletal:     Cervical back: Neck supple.     Comments: Left lower leg shows an extensive area of peeling.  In the center of this there is erythema, warmth, tenderness, and swelling.  There is no streaking, induration, fluctuance, wound, or exudate.  Lymphadenopathy:      Cervical: No cervical adenopathy.  Skin:    General: Skin is warm.     Findings: Erythema present.  Neurological:     General: No focal deficit present.     Mental Status: He is alert.     Lab Results  Component Value Date   WBC 8.4 09/15/2021   HGB 13.6 09/15/2021   HCT 40.0 09/15/2021   PLT 540.0 (H) 09/15/2021   GLUCOSE 105 (H) 09/12/2021   CHOL 133 02/09/2021   TRIG 145.0 02/09/2021   HDL 42.50 02/09/2021   LDLCALC 62 02/09/2021   ALT 22 09/06/2021   AST 24 09/06/2021   NA 141 09/12/2021   K 4.1 09/12/2021   CL 106 09/12/2021   CREATININE 0.98 09/12/2021   BUN 12 09/12/2021   CO2 29 09/12/2021   TSH 1.24 02/09/2021   PSA 0.880 08/30/2020   INR 1.0 03/05/2019   HGBA1C 5.3 09/08/2021    VAS [place] LOWER EXTREMITY VENOUS (DVT) (ONLY [facility] & [facility])  Result Date: 09/05/2021  Lower Venous DVT Study Patient Name:  Jonathan Powers  Date of Exam:   09/05/2021 Medical Rec #: [id number]         Accession #:    [id number] Date of Birth: [birth year]         Patient Gender: M Patient Age:   35 years Exam Location:  [facility] Procedure:      VAS [place] LOWER EXTREMITY VENOUS (DVT) Referring Phys: LESLIE SOFIA --------------------------------------------------------------------------------  Indications: Swelling.  Risk Factors: None identified. Limitations: Poor ultrasound/tissue interface. Comparison Study: No prior studies. Performing Technologist: Oliver Hum RVT  Examination Guidelines: A complete evaluation includes B-mode imaging, spectral Doppler, color Doppler, and power Doppler as needed of all accessible portions of each vessel. Bilateral testing is considered an integral part of a complete examination. Limited examinations for reoccurring indications may be performed as noted. The reflux portion of the exam is performed with the patient in reverse Trendelenburg.  +-----+---------------+---------+-----------+----------+--------------+  RIGHTCompressibilityPhasicitySpontaneityPropertiesThrombus Aging +-----+---------------+---------+-----------+----------+--------------+ CFV  Full           Yes      Yes                                 +-----+---------------+---------+-----------+----------+--------------+   +---------+---------------+---------+-----------+----------+-------------------+ LEFT     CompressibilityPhasicitySpontaneityPropertiesThrombus Aging      +---------+---------------+---------+-----------+----------+-------------------+ CFV      Full           Yes      Yes                                      +---------+---------------+---------+-----------+----------+-------------------+ SFJ  Full                                                             +---------+---------------+---------+-----------+----------+-------------------+ FV Prox  Full                                                             +---------+---------------+---------+-----------+----------+-------------------+ FV Mid   Full                                                             +---------+---------------+---------+-----------+----------+-------------------+ FV Distal               Yes      Yes                                      +---------+---------------+---------+-----------+----------+-------------------+ PFV      Full                                                             +---------+---------------+---------+-----------+----------+-------------------+ POP      Full           Yes      Yes                                      +---------+---------------+---------+-----------+----------+-------------------+ PTV      Full                                                             +---------+---------------+---------+-----------+----------+-------------------+ PERO                                                  Not well visualized  +---------+---------------+---------+-----------+----------+-------------------+    Summary: RIGHT: - No evidence of common femoral vein obstruction.  LEFT: - There is no evidence of deep vein thrombosis in the lower extremity. However, portions of this examination were limited- see technologist comments above.  - A cystic structure, measuring 2.2 cm high by 2.9 cm wide by greater than 5.6 cm long, is found in the popliteal fossa.  *See table(s) above for measurements and observations. Electronically signed by Servando Snare MD on 09/05/2021 at 4:23:20 PM.    Final     Assessment &  Plan:   Jonathan Powers was seen today for recurrent skin infections.  Diagnoses and all orders for this visit:  Left leg cellulitis- Based on his symptoms and exam this has not resolved.  Will broaden the coverage of strep and other organisms with Augmentin.  Will treat for Staphylococcus species with Bactrim DS. -     amoxicillin-clavulanate (AUGMENTIN) 875-125 MG tablet; Take 1 tablet by mouth 2 (two) times daily for 7 days. -     sulfamethoxazole-trimethoprim (BACTRIM DS) 800-160 MG tablet; Take 1 tablet by mouth 2 (two) times daily for 7 days.   I am having Jonathan Powers. Jonathan Powers start on amoxicillin-clavulanate and sulfamethoxazole-trimethoprim. I am also having him maintain his cetirizine, acetaminophen, tadalafil, valACYclovir, olmesartan-hydrochlorothiazide, esomeprazole, levothyroxine, rosuvastatin, traZODone, fluticasone, and ketoconazole.  Meds ordered this encounter  Medications   amoxicillin-clavulanate (AUGMENTIN) 875-125 MG tablet    Sig: Take 1 tablet by mouth 2 (two) times daily for 7 days.    Dispense:  14 tablet    Refill:  0   sulfamethoxazole-trimethoprim (BACTRIM DS) 800-160 MG tablet    Sig: Take 1 tablet by mouth 2 (two) times daily for 7 days.    Dispense:  14 tablet    Refill:  0     Follow-up: Return in about 1 week (around 09/29/2021).  Scarlette Calico, MD

## 2021-09-22 NOTE — Patient Instructions (Signed)

## 2021-10-03 ENCOUNTER — Telehealth: Payer: Self-pay | Admitting: Internal Medicine

## 2021-10-03 NOTE — Telephone Encounter (Signed)
LVM for pt to rtn my call to schedule AWV-I with [facility] call back # [phone] 

## 2021-10-07 ENCOUNTER — Other Ambulatory Visit (HOSPITAL_COMMUNITY): Payer: Self-pay

## 2021-10-11 ENCOUNTER — Other Ambulatory Visit: Payer: Self-pay

## 2021-10-11 ENCOUNTER — Ambulatory Visit (INDEPENDENT_AMBULATORY_CARE_PROVIDER_SITE_OTHER): Payer: Medicare Other | Admitting: Internal Medicine

## 2021-10-11 VITALS — BP 116/78 | HR 63 | Temp 98.8°F | Resp 16 | Ht 70.5 in | Wt 252.6 lb

## 2021-10-11 DIAGNOSIS — L03116 Cellulitis of left lower limb: Secondary | ICD-10-CM

## 2021-10-11 NOTE — Progress Notes (Unsigned)
Subjective:  Patient ID: Jonathan Powers, male    DOB: [birth year]  Age: 71 y.o. MRN: [id number]  CC: Follow-up (Patient states he is here for a follow up on leg. He states states his leg is better and he can not complain .)   HPI JALIL LORUSSO presents for ***  Outpatient Medications Prior to Visit  Medication Sig Dispense Refill   acetaminophen (TYLENOL) 500 MG tablet Take 1,000 mg by mouth every 6 (six) hours as needed for moderate pain.      cetirizine (ZYRTEC) 10 MG tablet Take 10 mg by mouth daily.     esomeprazole (NEXIUM) 40 MG capsule Take 1 capsule (40 mg total) by mouth daily. 90 capsule 1   fluticasone (FLONASE) 50 MCG/ACT nasal spray Place 1 spray into both nostrils daily as needed for allergies.     ketoconazole (NIZORAL) 2 % cream Apply 1 Application topically daily as needed for irritation (rash).     levothyroxine (SYNTHROID) 175 MCG tablet Take 1 tablet (175 mcg total) by mouth daily before breakfast. 90 tablet 0   olmesartan-hydrochlorothiazide (BENICAR HCT) 40-25 MG tablet Take 1 tablet by mouth daily. As directed 90 tablet 3   rosuvastatin (CRESTOR) 5 MG tablet TAKE ONE TABLET BY MOUTH DAILY AT BEDTIME (Patient taking differently: Take 5 mg by mouth daily.) 90 tablet 1   tadalafil (CIALIS) 5 MG tablet Take 5 mg by mouth daily as needed for erectile dysfunction.     traZODone (DESYREL) 100 MG tablet TAKE 1 TABLET BY MOUTH AT BEDTIME AS NEEDED FOR SLEEP. 90 tablet 1   valACYclovir (VALTREX) 1000 MG tablet Take 1 tablet (1,000 mg total) by mouth 2 (two) times daily. (Patient taking differently: Take 1,000 mg by mouth 2 (two) times daily as needed (outbreak prevention).) 30 tablet 0   No facility-administered medications prior to visit.    ROS Review of Systems  Objective:  BP 116/78   Pulse 63   Temp 98.8 F (37.1 C) (Temporal)   Resp 16   Ht 5' 10.5" (1.791 m)   Wt 252 lb 9.6 oz (114.6 kg)   SpO2 96%   BMI 35.73 kg/m   BP Readings from Last 3  Encounters:  10/11/21 116/78  09/22/21 114/68  09/15/21 122/66    Wt Readings from Last 3 Encounters:  10/11/21 252 lb 9.6 oz (114.6 kg)  09/22/21 252 lb (114.3 kg)  09/15/21 260 lb 8 oz (118.2 kg)    Physical Exam  Lab Results  Component Value Date   WBC 8.4 09/15/2021   HGB 13.6 09/15/2021   HCT 40.0 09/15/2021   PLT 540.0 (H) 09/15/2021   GLUCOSE 105 (H) 09/12/2021   CHOL 133 02/09/2021   TRIG 145.0 02/09/2021   HDL 42.50 02/09/2021   LDLCALC 62 02/09/2021   ALT 22 09/06/2021   AST 24 09/06/2021   NA 141 09/12/2021   K 4.1 09/12/2021   CL 106 09/12/2021   CREATININE 0.98 09/12/2021   BUN 12 09/12/2021   CO2 29 09/12/2021   TSH 1.24 02/09/2021   PSA 0.880 08/30/2020   INR 1.0 03/05/2019   HGBA1C 5.3 09/08/2021    VAS [place] LOWER EXTREMITY VENOUS (DVT) (ONLY [facility])  Result Date: 09/05/2021  Lower Venous DVT Study Patient Name:  KIEN MIRSKY  Date of Exam:   09/05/2021 Medical Rec #: [id number]         Accession #:    [id number] Date of Birth: 04/05/1950  Patient Gender: M Patient Age:   68 years Exam Location:  [facility] Procedure:      VAS [place] LOWER EXTREMITY VENOUS (DVT) Referring Phys: LESLIE SOFIA --------------------------------------------------------------------------------  Indications: Swelling.  Risk Factors: None identified. Limitations: Poor ultrasound/tissue interface. Comparison Study: No prior studies. Performing Technologist: Oliver Hum RVT  Examination Guidelines: A complete evaluation includes B-mode imaging, spectral Doppler, color Doppler, and power Doppler as needed of all accessible portions of each vessel. Bilateral testing is considered an integral part of a complete examination. Limited examinations for reoccurring indications may be performed as noted. The reflux portion of the exam is performed with the patient in reverse Trendelenburg.  +-----+---------------+---------+-----------+----------+--------------+  RIGHTCompressibilityPhasicitySpontaneityPropertiesThrombus Aging +-----+---------------+---------+-----------+----------+--------------+ CFV  Full           Yes      Yes                                 +-----+---------------+---------+-----------+----------+--------------+   +---------+---------------+---------+-----------+----------+-------------------+ LEFT     CompressibilityPhasicitySpontaneityPropertiesThrombus Aging      +---------+---------------+---------+-----------+----------+-------------------+ CFV      Full           Yes      Yes                                      +---------+---------------+---------+-----------+----------+-------------------+ SFJ      Full                                                             +---------+---------------+---------+-----------+----------+-------------------+ FV Prox  Full                                                             +---------+---------------+---------+-----------+----------+-------------------+ FV Mid   Full                                                             +---------+---------------+---------+-----------+----------+-------------------+ FV Distal               Yes      Yes                                      +---------+---------------+---------+-----------+----------+-------------------+ PFV      Full                                                             +---------+---------------+---------+-----------+----------+-------------------+ POP      Full           Yes  Yes                                      +---------+---------------+---------+-----------+----------+-------------------+ PTV      Full                                                             +---------+---------------+---------+-----------+----------+-------------------+ PERO                                                  Not well visualized  +---------+---------------+---------+-----------+----------+-------------------+    Summary: RIGHT: - No evidence of common femoral vein obstruction.  LEFT: - There is no evidence of deep vein thrombosis in the lower extremity. However, portions of this examination were limited- see technologist comments above.  - A cystic structure, measuring 2.2 cm high by 2.9 cm wide by greater than 5.6 cm long, is found in the popliteal fossa.  *See table(s) above for measurements and observations. Electronically signed by Servando Snare MD on 09/05/2021 at 4:23:20 PM.    Final     Assessment & Plan:   There are no diagnoses linked to this encounter. I am having Karlin Heilman. [place] maintain his cetirizine, acetaminophen, tadalafil, valACYclovir, olmesartan-hydrochlorothiazide, esomeprazole, levothyroxine, rosuvastatin, traZODone, fluticasone, and ketoconazole.  No orders of the defined types were placed in this encounter.    Follow-up: No follow-ups on file.  Scarlette Calico, MD

## 2021-10-12 ENCOUNTER — Encounter: Payer: Self-pay | Admitting: Internal Medicine

## 2021-10-27 ENCOUNTER — Ambulatory Visit (INDEPENDENT_AMBULATORY_CARE_PROVIDER_SITE_OTHER): Payer: Medicare Other | Admitting: Physician Assistant

## 2021-10-27 VITALS — BP 103/67 | HR 67 | Ht 71.0 in | Wt 251.0 lb

## 2021-10-27 DIAGNOSIS — F09 Unspecified mental disorder due to known physiological condition: Secondary | ICD-10-CM

## 2021-10-27 DIAGNOSIS — G3184 Mild cognitive impairment, so stated: Secondary | ICD-10-CM

## 2021-10-27 MED ORDER — MEMANTINE HCL 5 MG PO TABS: ORAL_TABLET | ORAL | 11 refills | Status: DC

## 2021-10-27 NOTE — Patient Instructions (Signed)
It was a pleasure to see you today at our office.   Recommendations:  Follow up in 6 months Start Memantine 5 mg tablets.  Take 1 tablet at bedtime for 2 weeks, then 1 tablet twice daily.   Side effects include dizziness, headache, diarrhea or constipation.  Call with any questions or concerns.  Neurocognitive testing     RECOMMENDATIONS FOR ALL PATIENTS WITH MEMORY PROBLEMS: 1. Continue to exercise (Recommend 30 minutes of walking everyday, or 3 hours every week) 2. Increase social interactions - continue going to [place] and enjoy social gatherings with friends and family 3. Eat healthy, avoid fried foods and eat more fruits and vegetables 4. Maintain adequate blood pressure, blood sugar, and blood cholesterol level. Reducing the risk of stroke and cardiovascular disease also helps promoting better memory. 5. Avoid stressful situations. Live a simple life and avoid aggravations. Organize your time and prepare for the next day in anticipation. 6. Sleep well, avoid any interruptions of sleep and avoid any distractions in the bedroom that may interfere with adequate sleep quality 7. Avoid sugar, avoid sweets as there is a strong link between excessive sugar intake, diabetes, and cognitive impairment We discussed the Mediterranean diet, which has been shown to help patients reduce the risk of progressive memory disorders and reduces cardiovascular risk. This includes eating fish, eat fruits and green leafy vegetables, nuts like almonds and hazelnuts, walnuts, and also use olive oil. Avoid fast foods and fried foods as much as possible. Avoid sweets and sugar as sugar use has been linked to worsening of memory function.  There is always a concern of gradual progression of memory problems. If this is the case, then we may need to adjust level of care according to patient needs. Support, both to the patient and caregiver, should then be put into place.     FALL PRECAUTIONS: Be cautious when  walking. Scan the area for obstacles that may increase the risk of trips and falls. When getting up in the mornings, sit up at the edge of the bed for a few minutes before getting out of bed. Consider elevating the bed at the head end to avoid drop of blood pressure when getting up. Walk always in a well-lit room (use night lights in the walls). Avoid area rugs or power cords from appliances in the middle of the walkways. Use a walker or a cane if necessary and consider physical therapy for balance exercise. Get your eyesight checked regularly.  FINANCIAL OVERSIGHT: Supervision, especially oversight when making financial decisions or transactions is also recommended.  HOME SAFETY: Consider the safety of the kitchen when operating appliances like stoves, microwave oven, and blender. Consider having supervision and share cooking responsibilities until no longer able to participate in those. Accidents with firearms and other hazards in the house should be identified and addressed as well.   ABILITY TO BE LEFT ALONE: If patient is unable to contact 911 operator, consider using LifeLine, or when the need is there, arrange for someone to stay with patients. Smoking is a fire hazard, consider supervision or cessation. Risk of wandering should be assessed by caregiver and if detected at any point, supervision and safe proof recommendations should be instituted.  MEDICATION SUPERVISION: Inability to self-administer medication needs to be constantly addressed. Implement a mechanism to ensure safe administration of the medications.   DRIVING: Regarding driving, in patients with progressive memory problems, driving will be impaired. We advise to have someone else do the driving if trouble finding directions  or if minor accidents are reported. Independent driving assessment is available to determine safety of driving.   If you are interested in the driving assessment, you can contact the following:  [employer]  [employer] in [place]  [place] [phone] or [phone]    [place] refers to food and lifestyle choices that are based on the traditions of countries located on the [employer]. This way of eating has been shown to help prevent certain conditions and improve outcomes for people who have chronic diseases, like kidney disease and heart disease. What are tips for following this plan? Lifestyle  Cook and eat meals together with your family, when possible. Drink enough fluid to keep your urine clear or pale yellow. Be physically active every day. This includes: Aerobic exercise like running or swimming. Leisure activities like gardening, walking, or housework. Get 7-8 hours of sleep each night. If recommended by your health care provider, drink red wine in moderation. This means 1 glass a day for nonpregnant women and 2 glasses a day for men. A glass of wine equals 5 oz (150 mL). Reading food labels  Check the serving size of packaged foods. For foods such as rice and pasta, the serving size refers to the amount of cooked product, not dry. Check the total fat in packaged foods. Avoid foods that have saturated fat or trans fats. Check the ingredients list for added sugars, such as corn syrup. Shopping  At the grocery store, buy most of your food from the areas near the walls of the store. This includes: Fresh fruits and vegetables (produce). Grains, beans, nuts, and seeds. Some of these may be available in unpackaged forms or large amounts (in bulk). Fresh seafood. Poultry and eggs. Low-fat dairy products. Buy whole ingredients instead of prepackaged foods. Buy fresh fruits and vegetables in-season from local farmers markets. Buy frozen fruits and vegetables in resealable bags. If you do not have access to quality fresh seafood, buy  precooked frozen shrimp or canned fish, such as tuna, salmon, or sardines. Buy small amounts of raw or cooked vegetables, salads, or olives from the deli or salad bar at your store. Stock your pantry so you always have certain foods on hand, such as olive oil, canned tuna, canned tomatoes, rice, pasta, and beans. Cooking  Cook foods with extra-virgin olive oil instead of using butter or other vegetable oils. Have meat as a side dish, and have vegetables or grains as your main dish. This means having meat in small portions or adding small amounts of meat to foods like pasta or stew. Use beans or vegetables instead of meat in common dishes like chili or lasagna. Experiment with different cooking methods. Try roasting or broiling vegetables instead of steaming or sauteing them. Add frozen vegetables to soups, stews, pasta, or rice. Add nuts or seeds for added healthy fat at each meal. You can add these to yogurt, salads, or vegetable dishes. Marinate fish or vegetables using olive oil, lemon juice, garlic, and fresh herbs. Meal planning  Plan to eat 1 vegetarian meal one day each week. Try to work up to 2 vegetarian meals, if possible. Eat seafood 2 or more times a week. Have healthy snacks readily available, such as: Vegetable sticks with hummus. Greek yogurt. Fruit and nut trail mix. Eat balanced meals throughout the week. This includes: Fruit: 2-3 servings a day Vegetables: 4-5 servings a day Low-fat dairy: 2  servings a day Fish, poultry, or lean meat: 1 serving a day Beans and legumes: 2 or more servings a week Nuts and seeds: 1-2 servings a day Whole grains: 6-8 servings a day Extra-virgin olive oil: 3-4 servings a day Limit red meat and sweets to only a few servings a month What are my food choices? Mediterranean diet Recommended Grains: Whole-grain pasta. Brown rice. Bulgar wheat. Polenta. Couscous. Whole-wheat bread. [place]. Vegetables: Artichokes. Beets. Broccoli.  Cabbage. Carrots. Eggplant. Green beans. Chard. Kale. Spinach. Onions. Leeks. Peas. Squash. Tomatoes. Peppers. Radishes. Fruits: Apples. Apricots. Avocado. Berries. Bananas. Cherries. Dates. Figs. Grapes. Lemons. Melon. Oranges. Peaches. Plums. Pomegranate. Meats and other protein foods: Beans. Almonds. Sunflower seeds. Pine nuts. Peanuts. [place]. Salmon. Scallops. Shrimp. [place]. Tilapia. Clams. Oysters. Eggs. Dairy: Low-fat milk. Cheese. Greek yogurt. Beverages: Water. Red wine. Herbal tea. Fats and oils: Extra virgin olive oil. Avocado oil. Grape seed oil. Sweets and desserts: [place] yogurt with honey. Baked apples. Poached pears. Trail mix. Seasoning and other foods: Basil. Cilantro. Coriander. Cumin. Mint. Parsley. Sage. Rosemary. Tarragon. Garlic. Oregano. Thyme. Pepper. Balsalmic vinegar. Tahini. Hummus. Tomato sauce. Olives. Mushrooms. Limit these Grains: Prepackaged pasta or rice dishes. Prepackaged cereal with added sugar. Vegetables: Deep fried potatoes (french fries). Fruits: Fruit canned in syrup. Meats and other protein foods: Beef. Pork. Lamb. Poultry with skin. Hot dogs. [place] Salines. Dairy: Ice cream. Sour cream. Whole milk. Beverages: Juice. Sugar-sweetened soft drinks. Beer. Liquor and spirits. Fats and oils: Butter. Canola oil. Vegetable oil. Beef fat (tallow). Lard. Sweets and desserts: Cookies. Cakes. Pies. Candy. Seasoning and other foods: Mayonnaise. Premade sauces and marinades. The items listed may not be a complete list. Talk with your dietitian about what dietary choices are right for you. Summary The Mediterranean diet includes both food and lifestyle choices. Eat a variety of fresh fruits and vegetables, beans, nuts, seeds, and whole grains. Limit the amount of red meat and sweets that you eat. Talk with your health care provider about whether it is safe for you to drink red wine in moderation. This means 1 glass a day for nonpregnant women and 2 glasses a day for men. A glass  of wine equals 5 oz (150 mL). This information is not intended to replace advice given to you by your health care provider. Make sure you discuss any questions you have with your health care provider. Document Released: 09/30/2015 Document Revised: 11/02/2015 Document Reviewed: 09/30/2015 Elsevier Interactive Patient Education  2017 [employer].

## 2021-10-27 NOTE — Progress Notes (Signed)
Assessment/Plan:   Mild Cognitive Impairment likely due to Alzheimer's Jonathan Powers is a very pleasant 71 y.o. RH male with a history of amnestic MCI per neurocognitive exam a 1222.  MRI brain showed mild generalized volume loss   Last MMSE on March 2023 was 27/30.  He has been placed on memantine 5 mg twice daily (history of diarrhea) but has not been taking it. He is presenting today in follow-up for evaluation of memory loss. Today's MMSE is 26/30, slight decline since the last one only 6 months ago.      Recommendations:   Follow up in 6  months. Repeat neurocognitive testing in 6-12 months  Start memantine 5 mg x 2 weeks, then increase to 1 po bid if tolerated, side effects discussed     Subjective:   This patient is here alone.  Previous records as well as any outside records available were reviewed prior to todays visit.       Any changes in memory since last visit?  "Worse, STM and LTM". He may be forgetting the name of people he knew and some notion of time. .  repeats oneself? " I don't recall if that happens " Patient lives with: wife  Disoriented when walking into a room?  Patient denies   Leaving objects in unusual places?  Patient denies   Ambulates  with difficulty?   Patient denies   Recent falls?  Patient denies   Any head injuries?  Patient denies   History of seizures?   Patient denies   Wandering behavior?  Patient denies   Patient drives?   No issues Any mood changes since last visit?  Patient denies   Any worsening depression?:  Patient denies   Hallucinations?  Patient denies   Paranoia?  Patient denies   Patient reports that sleeps well without vivid dreams, REM behavior or sleepwalking   History of sleep apnea?  Patient denies   Any hygiene concerns?  Patient denies   Independent of bathing and dressing?  Endorsed  Does the patient needs help with medications?  Denies Who is in charge of the finances? He  is in charge    Any  changes in appetite?  Patient denies   Patient have trouble swallowing? Patient denies   Does the patient cook?  Patient denies   Any kitchen accidents such as leaving the stove on? Patient denies   Any headaches?  Patient denies   Double vision? Patient denies   Any focal numbness or tingling?  Patient denies   Chronic back pain Patient denies   Unilateral weakness?  Patient denies   Any tremors?  Patient denies   Any history of anosmia?  Patient denies   Any incontinence of urine?  Patient denies   Any bowel dysfunction?   Patient denies       Past Medical History:  Diagnosis Date   ALLERGIC RHINITIS    Cancer ([place])    Melonoma back   CELLULITIS, LEG, RIGHT    Recurrent R hip cellulitis 1/08,3/08    Diverticulitis    GERD (gastroesophageal reflux disease)    Gout    Hashimoto's thyroiditis    Hypertension    Hypogonadism male    low T, a/w ED   Hypothyroid    goiters    Left thyroid nodule 2008   on [place], decrease size in 2011 [place]   Migraines    Atypical and ocular   OSTEOARTHRITIS    Seasonal  allergies    Sleep apnea    cpap     Past Surgical History:  Procedure Laterality Date   CARPAL TUNNEL RELEASE Left 03/30/2017   Procedure: CARPAL TUNNEL RELEASE;  Surgeon: Garald Balding, MD;  Location: [facility];  Service: Orthopedics;  Laterality: Left;   CHOLECYSTECTOMY  2004   CTR Bilateral [place]   FOOT FUSION Left 06/22/2017   HEMORRHOID SURGERY N/A 03/30/2017   Procedure: HEMORRHOIDECTOMY;  Surgeon: Coralie Keens, MD;  Location: [facility];  Service: General;  Laterality: N/A;   open reduction L little finger Left 1970   Repair digital thumb, left Left 1990   Right shoulder cuff repair Right 09/15/11   Right shoulder SAD, DCR Right 02/05/11   TOTAL HIP ARTHROPLASTY Left 07/26/06   TOTAL HIP ARTHROPLASTY Right 1999   TOTAL HIP REVISION Left 03/07/2019   Procedure: LEFT TOTAL HIP REVISION POSTERIOR APPROACH;  Surgeon:  Frederik Pear, MD;  Location: [facility];  Service: Orthopedics;  Laterality: Left;     PREVIOUS MEDICATIONS:   CURRENT MEDICATIONS:  Outpatient Encounter Medications as of 10/27/2021  Medication Sig   memantine (NAMENDA) 5 MG tablet Take 1 tablet (5 mg at night) for 2 weeks, then increase to 1 tablet (5 mg) twice a day   acetaminophen (TYLENOL) 500 MG tablet Take 1,000 mg by mouth every 6 (six) hours as needed for moderate pain.    cetirizine (ZYRTEC) 10 MG tablet Take 10 mg by mouth daily.   esomeprazole (NEXIUM) 40 MG capsule Take 1 capsule (40 mg total) by mouth daily.   fluticasone (FLONASE) 50 MCG/ACT nasal spray Place 1 spray into both nostrils daily as needed for allergies.   ketoconazole (NIZORAL) 2 % cream Apply 1 Application topically daily as needed for irritation (rash).   levothyroxine (SYNTHROID) 175 MCG tablet Take 1 tablet (175 mcg total) by mouth daily before breakfast.   olmesartan-hydrochlorothiazide (BENICAR HCT) 40-25 MG tablet Take 1 tablet by mouth daily. As directed   rosuvastatin (CRESTOR) 5 MG tablet TAKE ONE TABLET BY MOUTH DAILY AT BEDTIME (Patient taking differently: Take 5 mg by mouth daily.)   tadalafil (CIALIS) 5 MG tablet Take 5 mg by mouth daily as needed for erectile dysfunction.   traZODone (DESYREL) 100 MG tablet TAKE 1 TABLET BY MOUTH AT BEDTIME AS NEEDED FOR SLEEP.   valACYclovir (VALTREX) 1000 MG tablet Take 1 tablet (1,000 mg total) by mouth 2 (two) times daily. (Patient taking differently: Take 1,000 mg by mouth 2 (two) times daily as needed (outbreak prevention).)   [DISCONTINUED] apixaban (ELIQUIS) 2.5 MG TABS tablet Take 1 tablet (2.5 mg total) by mouth 2 (two) times daily.   No facility-administered encounter medications on file as of 10/27/2021.     Objective:     PHYSICAL EXAMINATION:    VITALS:   Vitals:   10/27/21 1312  BP: 103/67  Pulse: 67  SpO2: 96%  Weight: 251 lb (113.9 kg)  Height: '5\' 11"'$  (1.803 m)    GEN:  The patient appears  stated age and is in NAD. HEENT:  Normocephalic, atraumatic.   Neurological examination:  General: NAD, well-groomed, appears stated age. Orientation: The patient is alert. Oriented to person, place and date Cranial nerves: There is good facial symmetry.The speech is fluent and clear. No aphasia or dysarthria. Fund of knowledge is appropriate. Recent memory impaired and remote memory is normal.  Attention and concentration are normal.  Able to name objects and repeat  phrases.  Hearing is intact to conversational tone.    Sensation: Sensation is intact to light touch throughout Motor: Strength is at least antigravity x4. Tremors: none  DTR's 2/4 in UE/LE      07/06/2020   11:00 AM  Montreal Cognitive Assessment   Visuospatial/ Executive (0/5) 3  Naming (0/3) 3  Attention: Read list of digits (0/2) 2  Attention: Read list of letters (0/1) 1  Attention: Serial 7 subtraction starting at 100 (0/3) 3  Language: Repeat phrase (0/2) 1  Language : Fluency (0/1) 1  Abstraction (0/2) 2  Delayed Recall (0/5) 0  Orientation (0/6) 6  Total 22  Adjusted Score (based on education) 22       10/27/2021    2:00 PM 04/27/2021    6:00 AM  MMSE - Mini Mental State Exam  Orientation to time 4 5  Orientation to Place 5 5  Registration 3 3  Attention/ Calculation 5 4  Recall 1 1  Language- name 2 objects 2 2  Language- repeat 1 1  Language- follow 3 step command 3 3  Language- read & follow direction 1 1  Write a sentence 1 1  Copy design 0 1  Total score 26 27       Movement examination: Tone: There is normal tone in the UE/LE Abnormal movements:  no tremor.  No myoclonus.  No asterixis.   Coordination:  There is no decremation with RAM's. Normal finger to nose  Gait and Station: The patient has no difficulty arising out of a deep-seated chair without the use of the hands. The patient's stride length is good.  Gait is cautious and narrow.   Thank you for allowing [place] the opportunity to  participate in the care of this nice patient. Please do not hesitate to contact us for any questions or concerns.   Total time spent on today's visit was 30 minutes dedicated to this patient today, preparing to see patient, examining the patient, ordering tests and/or medications and counseling the patient, documenting clinical information in the EHR or other health record, independently interpreting results and communicating results to the patient/family, discussing treatment and goals, answering patient's questions and coordinating care.  Cc:  [name], MD  Sharene Butters 10/27/2021 2:12 PM

## 2021-11-01 ENCOUNTER — Other Ambulatory Visit: Payer: Self-pay | Admitting: Internal Medicine

## 2021-11-01 DIAGNOSIS — E039 Hypothyroidism, unspecified: Secondary | ICD-10-CM

## 2021-11-01 DIAGNOSIS — E785 Hyperlipidemia, unspecified: Secondary | ICD-10-CM

## 2021-11-01 MED ORDER — LEVOTHYROXINE SODIUM 175 MCG PO TABS: 175.0000 ug | ORAL_TABLET | Freq: Every day | ORAL | 0 refills | Status: DC

## 2021-11-07 ENCOUNTER — Encounter: Payer: Self-pay | Admitting: Internal Medicine

## 2021-11-08 ENCOUNTER — Encounter: Payer: Self-pay | Admitting: Internal Medicine

## 2021-11-08 ENCOUNTER — Ambulatory Visit (INDEPENDENT_AMBULATORY_CARE_PROVIDER_SITE_OTHER): Payer: Medicare Other

## 2021-11-08 ENCOUNTER — Ambulatory Visit (INDEPENDENT_AMBULATORY_CARE_PROVIDER_SITE_OTHER): Payer: Medicare Other | Admitting: Internal Medicine

## 2021-11-08 VITALS — BP 110/72 | HR 50 | Temp 98.1°F | Ht 71.0 in | Wt 251.0 lb

## 2021-11-08 DIAGNOSIS — Z23 Encounter for immunization: Secondary | ICD-10-CM

## 2021-11-08 DIAGNOSIS — E032 Hypothyroidism due to medicaments and other exogenous substances: Secondary | ICD-10-CM | POA: Diagnosis not present

## 2021-11-08 DIAGNOSIS — R109 Unspecified abdominal pain: Secondary | ICD-10-CM | POA: Diagnosis not present

## 2021-11-08 DIAGNOSIS — R108A2 Left flank tenderness: Secondary | ICD-10-CM | POA: Insufficient documentation

## 2021-11-08 DIAGNOSIS — R10819 Abdominal tenderness, unspecified site: Secondary | ICD-10-CM

## 2021-11-08 DIAGNOSIS — I10 Essential (primary) hypertension: Secondary | ICD-10-CM

## 2021-11-08 LAB — URINALYSIS, ROUTINE W REFLEX MICROSCOPIC
Hgb urine dipstick: NEGATIVE
Ketones, ur: NEGATIVE
Leukocytes,Ua: NEGATIVE
Nitrite: NEGATIVE
RBC / HPF: NONE SEEN (ref 0–?)
Specific Gravity, Urine: 1.025 (ref 1.000–1.030)
Total Protein, Urine: NEGATIVE
Urine Glucose: NEGATIVE
Urobilinogen, UA: 0.2 (ref 0.0–1.0)
pH: 5.5 (ref 5.0–8.0)

## 2021-11-08 LAB — HEPATIC FUNCTION PANEL
ALT: 14 U/L (ref 0–53)
AST: 17 U/L (ref 0–37)
Albumin: 3.9 g/dL (ref 3.5–5.2)
Alkaline Phosphatase: 49 U/L (ref 39–117)
Bilirubin, Direct: 0.1 mg/dL (ref 0.0–0.3)
Total Bilirubin: 0.7 mg/dL (ref 0.2–1.2)
Total Protein: 6.9 g/dL (ref 6.0–8.3)

## 2021-11-08 LAB — BASIC METABOLIC PANEL
BUN: 14 mg/dL (ref 6–23)
CO2: 32 mEq/L (ref 19–32)
Calcium: 9.2 mg/dL (ref 8.4–10.5)
Chloride: 98 mEq/L (ref 96–112)
Creatinine, Ser: 1.07 mg/dL (ref 0.40–1.50)
GFR: 70.53 mL/min (ref >60.00)
Glucose, Bld: 103 mg/dL — ABNORMAL HIGH (ref 70–99)
Potassium: 4 mEq/L (ref 3.5–5.1)
Sodium: 136 mEq/L (ref 135–145)

## 2021-11-08 LAB — CBC WITH DIFFERENTIAL/PLATELET
Basophils Absolute: 0 10*3/uL (ref 0.0–0.1)
Basophils Relative: 0.4 % (ref 0.0–3.0)
Eosinophils Absolute: 0.1 10*3/uL (ref 0.0–0.7)
Eosinophils Relative: 2.2 % (ref 0.0–5.0)
HCT: 42.5 % (ref 39.0–52.0)
Hemoglobin: 14.9 g/dL (ref 13.0–17.0)
Lymphocytes Relative: 24.9 % (ref 12.0–46.0)
Lymphs Abs: 1.3 10*3/uL (ref 0.7–4.0)
MCHC: 35.1 g/dL (ref 30.0–36.0)
MCV: 87.5 fl (ref 78.0–100.0)
Monocytes Absolute: 0.6 10*3/uL (ref 0.1–1.0)
Monocytes Relative: 11.8 % (ref 3.0–12.0)
Neutro Abs: 3.2 10*3/uL (ref 1.4–7.7)
Neutrophils Relative %: 60.7 % (ref 43.0–77.0)
Platelets: 279 10*3/uL (ref 150.0–400.0)
RBC: 4.85 Mil/uL (ref 4.22–5.81)
RDW: 13.6 % (ref 11.5–15.5)
WBC: 5.3 10*3/uL (ref 4.0–10.5)

## 2021-11-08 LAB — AMYLASE: Amylase: 39 U/L (ref 27–131)

## 2021-11-08 LAB — TSH: TSH: 1.48 u[IU]/mL (ref 0.35–5.50)

## 2021-11-08 LAB — LIPASE: Lipase: 26 U/L (ref 11.0–59.0)

## 2021-11-08 NOTE — Patient Instructions (Signed)

## 2021-11-08 NOTE — Progress Notes (Signed)
Subjective:  Patient ID: Jonathan Powers, male    DOB: 17-Dec-1950  Age: 71 y.o. MRN: [id number]  CC: Abdominal Pain   HPI YOVANNI FRENETTE presents for f/up -  He complains of a 1 week history of pain in his left mid abdomen and flank.  He describes it as a throbbing, achy sensation.  The pain radiates into his back.  He has had some nausea but denies vomiting.  He has had a slight increase in appetite.  Outpatient Medications Prior to Visit  Medication Sig Dispense Refill   acetaminophen (TYLENOL) 500 MG tablet Take 1,000 mg by mouth every 6 (six) hours as needed for moderate pain.      cetirizine (ZYRTEC) 10 MG tablet Take 10 mg by mouth daily.     esomeprazole (NEXIUM) 40 MG capsule Take 1 capsule (40 mg total) by mouth daily. 90 capsule 1   fluticasone (FLONASE) 50 MCG/ACT nasal spray Place 1 spray into both nostrils daily as needed for allergies.     ketoconazole (NIZORAL) 2 % cream Apply 1 Application topically daily as needed for irritation (rash).     levothyroxine (SYNTHROID) 175 MCG tablet Take 1 tablet (175 mcg total) by mouth daily before breakfast. 90 tablet 0   memantine (NAMENDA) 5 MG tablet Take 1 tablet (5 mg at night) for 2 weeks, then increase to 1 tablet (5 mg) twice a day 60 tablet 11   olmesartan-hydrochlorothiazide (BENICAR HCT) 40-25 MG tablet Take 1 tablet by mouth daily. As directed 90 tablet 3   rosuvastatin (CRESTOR) 5 MG tablet Take 1 tablet (5 mg total) by mouth daily. 90 tablet 1   tadalafil (CIALIS) 5 MG tablet Take 5 mg by mouth daily as needed for erectile dysfunction.     traZODone (DESYREL) 100 MG tablet TAKE 1 TABLET BY MOUTH AT BEDTIME AS NEEDED FOR SLEEP. 90 tablet 1   valACYclovir (VALTREX) 1000 MG tablet Take 1 tablet (1,000 mg total) by mouth 2 (two) times daily. (Patient taking differently: Take 1,000 mg by mouth 2 (two) times daily as needed (outbreak prevention).) 30 tablet 0   No facility-administered medications prior to visit.     ROS Review of Systems  Constitutional:  Positive for appetite change. Negative for chills, diaphoresis, fatigue and fever.  HENT: Negative.    Respiratory: Negative.  Negative for cough, shortness of breath and wheezing.   Cardiovascular:  Negative for chest pain, palpitations and leg swelling.  Gastrointestinal:  Positive for abdominal pain and nausea. Negative for constipation, diarrhea and vomiting.  Endocrine: Negative.   Genitourinary: Negative.  Negative for difficulty urinating, dysuria and hematuria.  Musculoskeletal: Negative.   Skin: Negative.  Negative for rash.  Neurological:  Negative for dizziness and weakness.  Hematological:  Negative for adenopathy.    Objective:  BP 110/72 (BP Location: Right Arm, Patient Position: Sitting, Cuff Size: Normal)   Pulse (!) 50   Temp 98.1 F (36.7 C) (Oral)   Ht '5\' 11"'$  (1.803 m)   Wt 251 lb (113.9 kg)   SpO2 90%   BMI 35.01 kg/m   BP Readings from Last 3 Encounters:  11/08/21 110/72  10/27/21 103/67  10/11/21 116/78    Wt Readings from Last 3 Encounters:  11/08/21 251 lb (113.9 kg)  10/27/21 251 lb (113.9 kg)  10/11/21 252 lb 9.6 oz (114.6 kg)    Physical Exam Vitals reviewed.  Constitutional:      General: He is not in acute distress.    Appearance:  He is not ill-appearing, toxic-appearing or diaphoretic.  HENT:     Mouth/Throat:     Mouth: Mucous membranes are moist.  Eyes:     General: No scleral icterus.    Conjunctiva/sclera: Conjunctivae normal.  Cardiovascular:     Rate and Rhythm: Normal rate and regular rhythm.     Heart sounds: No murmur heard. Pulmonary:     Effort: Pulmonary effort is normal.     Breath sounds: No stridor. No wheezing, rhonchi or rales.  Abdominal:     General: Abdomen is protuberant. Bowel sounds are normal.     Palpations: There is no hepatomegaly, splenomegaly or mass.     Tenderness: There is abdominal tenderness in the left upper quadrant. There is no guarding or rebound.      Hernia: No hernia is present.  Musculoskeletal:        General: Normal range of motion.     Cervical back: Neck supple.     Right lower leg: No edema.     Left lower leg: No edema.  Lymphadenopathy:     Cervical: No cervical adenopathy.  Skin:    General: Skin is warm and dry.  Neurological:     General: No focal deficit present.  Psychiatric:        Mood and Affect: Mood normal.        Behavior: Behavior normal.     Lab Results  Component Value Date   WBC 5.3 11/08/2021   HGB 14.9 11/08/2021   HCT 42.5 11/08/2021   PLT 279.0 11/08/2021   GLUCOSE 103 (H) 11/08/2021   CHOL 133 02/09/2021   TRIG 145.0 02/09/2021   HDL 42.50 02/09/2021   LDLCALC 62 02/09/2021   ALT 14 11/08/2021   AST 17 11/08/2021   NA 136 11/08/2021   K 4.0 11/08/2021   CL 98 11/08/2021   CREATININE 1.07 11/08/2021   BUN 14 11/08/2021   CO2 32 11/08/2021   TSH 1.48 11/08/2021   PSA 0.880 08/30/2020   INR 1.0 03/05/2019   HGBA1C 5.3 09/08/2021    VAS [place] LOWER EXTREMITY VENOUS (DVT) (ONLY [facility] & [facility])  Result Date: 09/05/2021  Lower Venous DVT Study Patient Name:  Jonathan Powers  Date of Exam:   09/05/2021 Medical Rec #: [id number]         Accession #:    [id number] Date of Birth: May 03, 1950         Patient Gender: M Patient Age:   81 years Exam Location:  [facility] Procedure:      VAS [place] LOWER EXTREMITY VENOUS (DVT) Referring Phys: LESLIE SOFIA --------------------------------------------------------------------------------  Indications: Swelling.  Risk Factors: None identified. Limitations: Poor ultrasound/tissue interface. Comparison Study: No prior studies. Performing Technologist: Oliver Hum RVT  Examination Guidelines: A complete evaluation includes B-mode imaging, spectral Doppler, color Doppler, and power Doppler as needed of all accessible portions of each vessel. Bilateral testing is considered an integral part of a complete examination. Limited examinations for reoccurring  indications may be performed as noted. The reflux portion of the exam is performed with the patient in reverse Trendelenburg.  +-----+---------------+---------+-----------+----------+--------------+ RIGHTCompressibilityPhasicitySpontaneityPropertiesThrombus Aging +-----+---------------+---------+-----------+----------+--------------+ CFV  Full           Yes      Yes                                 +-----+---------------+---------+-----------+----------+--------------+   +---------+---------------+---------+-----------+----------+-------------------+ LEFT     CompressibilityPhasicitySpontaneityPropertiesThrombus  Aging      +---------+---------------+---------+-----------+----------+-------------------+ CFV      Full           Yes      Yes                                      +---------+---------------+---------+-----------+----------+-------------------+ SFJ      Full                                                             +---------+---------------+---------+-----------+----------+-------------------+ FV Prox  Full                                                             +---------+---------------+---------+-----------+----------+-------------------+ FV Mid   Full                                                             +---------+---------------+---------+-----------+----------+-------------------+ FV Distal               Yes      Yes                                      +---------+---------------+---------+-----------+----------+-------------------+ PFV      Full                                                             +---------+---------------+---------+-----------+----------+-------------------+ POP      Full           Yes      Yes                                      +---------+---------------+---------+-----------+----------+-------------------+ PTV      Full                                                              +---------+---------------+---------+-----------+----------+-------------------+ PERO                                                  Not well visualized +---------+---------------+---------+-----------+----------+-------------------+    Summary: RIGHT: - No evidence of common femoral vein obstruction.  LEFT: - There is  no evidence of deep vein thrombosis in the lower extremity. However, portions of this examination were limited- see technologist comments above.  - A cystic structure, measuring 2.2 cm high by 2.9 cm wide by greater than 5.6 cm long, is found in the popliteal fossa.  *See table(s) above for measurements and observations. Electronically signed by Servando Snare MD on 09/05/2021 at 4:23:20 PM.    Final    DG ABD ACUTE 2+V W 1V CHEST  Result Date: 11/08/2021 CLINICAL DATA:  Abdominal pain EXAM: DG ABDOMEN ACUTE WITH 1 VIEW CHEST COMPARISON:  Radiograph 09/08/2021 FINDINGS: The cardiomediastinal silhouette is within normal limits. There is no focal airspace consolidation. There is no pleural effusion. No pneumothorax. There is no evidence of subdiaphragmatic free air. Nonobstructive bowel gas pattern. Prior cholecystectomy. No acute osseous abnormality. Degenerative changes of the spine. Partially visualized total hip arthroplasties. IMPRESSION: Negative abdominal radiographs.  No acute cardiopulmonary disease. Electronically Signed   By: Maurine Simmering M.D.   On: 11/08/2021 10:08     Assessment & Plan:   Kayson was seen today for abdominal pain.  Diagnoses and all orders for this visit:  Flu vaccine need -     Flu Vaccine QUAD High Dose(Fluad)  Abdominal tenderness in left flank- His labs are reassuring.  I recommended a CT scan to evaluate for occult abscess, malignancy, AAA, mass. -     Lipase; Future -     CBC with Differential/Platelet; Future -     Amylase; Future -     Urinalysis, Routine w reflex microscopic; Future -     Hepatic function panel; Future -     DG ABD ACUTE 2+V  W 1V CHEST; Future -     Hepatic function panel -     Urinalysis, Routine w reflex microscopic -     Amylase -     CBC with Differential/Platelet -     Lipase -     CT Abdomen Pelvis W Contrast; Future  Essential hypertension- His blood pressure is adequately well controlled. -     Basic metabolic panel; Future -     CBC with Differential/Platelet; Future -     CBC with Differential/Platelet -     Basic metabolic panel  Hypothyroidism due to medication- He is euthyroid. -     TSH; Future -     TSH  Abdominal pain, unspecified abdominal location -     CT Abdomen Pelvis W Contrast; Future   I am having Saed Hudlow. Yager maintain his cetirizine, acetaminophen, tadalafil, valACYclovir, olmesartan-hydrochlorothiazide, esomeprazole, traZODone, fluticasone, ketoconazole, memantine, rosuvastatin, and levothyroxine.  No orders of the defined types were placed in this encounter.    Follow-up: Return in about 1 week (around 11/15/2021).  Scarlette Calico, MD

## 2021-11-21 ENCOUNTER — Other Ambulatory Visit: Payer: Self-pay | Admitting: Orthopaedic Surgery

## 2021-11-21 MED ORDER — AZITHROMYCIN 250 MG PO TABS: ORAL_TABLET | ORAL | 0 refills | Status: DC

## 2021-11-24 ENCOUNTER — Other Ambulatory Visit: Payer: Self-pay | Admitting: Internal Medicine

## 2021-11-24 ENCOUNTER — Encounter: Payer: Self-pay | Admitting: Internal Medicine

## 2021-11-24 DIAGNOSIS — R197 Diarrhea, unspecified: Secondary | ICD-10-CM | POA: Insufficient documentation

## 2021-11-25 ENCOUNTER — Other Ambulatory Visit: Payer: Medicare Other

## 2021-11-25 DIAGNOSIS — R197 Diarrhea, unspecified: Secondary | ICD-10-CM | POA: Diagnosis not present

## 2021-11-27 LAB — SPECIMEN STATUS REPORT

## 2021-11-27 LAB — CLOSTRIDIUM DIFFICILE EIA: C difficile Toxins A+B, EIA: NEGATIVE

## 2021-11-28 LAB — GI PROFILE, STOOL, PCR

## 2021-11-30 LAB — FECAL LACTOFERRIN, QUANT
Fecal Lactoferrin: NEGATIVE
MICRO NUMBER:: 14018744
SPECIMEN QUALITY:: ADEQUATE

## 2021-12-01 LAB — OVA AND PARASITE EXAMINATION
CONCENTRATE RESULT:: NONE SEEN
MICRO NUMBER:: 14018643
SPECIMEN QUALITY:: ADEQUATE
TRICHROME RESULT:: NONE SEEN

## 2021-12-02 ENCOUNTER — Ambulatory Visit
Admission: RE | Admit: 2021-12-02 | Discharge: 2021-12-02 | Disposition: A | Discharge status: Home / Self Care | Payer: Medicare Other | Source: Ambulatory Visit | Attending: Internal Medicine | Admitting: Internal Medicine

## 2021-12-02 DIAGNOSIS — R10819 Abdominal tenderness, unspecified site: Secondary | ICD-10-CM

## 2021-12-02 DIAGNOSIS — K760 Fatty (change of) liver, not elsewhere classified: Secondary | ICD-10-CM | POA: Diagnosis not present

## 2021-12-02 DIAGNOSIS — N281 Cyst of kidney, acquired: Secondary | ICD-10-CM | POA: Diagnosis not present

## 2021-12-02 DIAGNOSIS — K4020 Bilateral inguinal hernia, without obstruction or gangrene, not specified as recurrent: Secondary | ICD-10-CM | POA: Diagnosis not present

## 2021-12-02 DIAGNOSIS — K429 Umbilical hernia without obstruction or gangrene: Secondary | ICD-10-CM | POA: Diagnosis not present

## 2021-12-02 DIAGNOSIS — R109 Unspecified abdominal pain: Secondary | ICD-10-CM

## 2021-12-02 LAB — PANCREATIC ELASTASE, FECAL: Pancreatic Elastase-1, Stool: 118 mcg/g — ABNORMAL LOW

## 2021-12-02 MED ORDER — IOPAMIDOL (ISOVUE-370) INJECTION 76%
80.0000 mL | Freq: Once | INTRAVENOUS | Status: AC | PRN
  Administered 2021-12-02: 80 mL via INTRAVENOUS

## 2021-12-03 ENCOUNTER — Other Ambulatory Visit: Payer: Self-pay | Admitting: Internal Medicine

## 2021-12-03 DIAGNOSIS — K8681 Exocrine pancreatic insufficiency: Secondary | ICD-10-CM

## 2021-12-03 HISTORY — DX: Exocrine pancreatic insufficiency: K86.81

## 2021-12-03 MED ORDER — CREON 6000-19000 UNITS PO CPEP: 1.0000 | ORAL_CAPSULE | Freq: Three times a day (TID) | ORAL | 1 refills | Status: DC

## 2021-12-06 ENCOUNTER — Encounter: Payer: Self-pay | Admitting: Internal Medicine

## 2021-12-06 ENCOUNTER — Telehealth: Payer: Self-pay | Admitting: Internal Medicine

## 2021-12-06 ENCOUNTER — Other Ambulatory Visit: Payer: Self-pay | Admitting: Internal Medicine

## 2021-12-06 DIAGNOSIS — K8681 Exocrine pancreatic insufficiency: Secondary | ICD-10-CM

## 2021-12-06 NOTE — Telephone Encounter (Signed)
Pt is still experiencing issues with diarrhea and not getting any relief. This has been going on for 12 days. He hasn't had any luck with any medication, and is unsure of what to do. He is very upset and would like a call from Dr. Ronnald Ramp to discuss his options.   Please call: [phone]

## 2021-12-07 ENCOUNTER — Other Ambulatory Visit: Payer: Self-pay | Admitting: Internal Medicine

## 2021-12-14 DIAGNOSIS — N5201 Erectile dysfunction due to arterial insufficiency: Secondary | ICD-10-CM | POA: Diagnosis not present

## 2021-12-14 DIAGNOSIS — E291 Testicular hypofunction: Secondary | ICD-10-CM | POA: Diagnosis not present

## 2021-12-15 ENCOUNTER — Ambulatory Visit (INDEPENDENT_AMBULATORY_CARE_PROVIDER_SITE_OTHER): Payer: Medicare Other

## 2021-12-15 ENCOUNTER — Ambulatory Visit (INDEPENDENT_AMBULATORY_CARE_PROVIDER_SITE_OTHER): Payer: Medicare Other | Admitting: Orthopaedic Surgery

## 2021-12-15 DIAGNOSIS — M25561 Pain in right knee: Secondary | ICD-10-CM

## 2021-12-15 DIAGNOSIS — G8929 Other chronic pain: Secondary | ICD-10-CM | POA: Diagnosis not present

## 2021-12-15 NOTE — Progress Notes (Signed)
Office Visit Note   Patient: Jonathan Powers           Date of Birth: April 11, 1950           MRN: [id number] Visit Date: 12/15/2021              Requested by: [name], MD [address] [place],  [place] [address] PCP: [name], MD   Assessment & Plan: Visit Diagnoses:  1. Chronic pain of right knee     Plan: Jonathan Powers has been experiencing lateral right knee pain for less than a week without history of injury or trauma.  The pain is directly over the lateral compartment.  Films demonstrate some early arthritis.  There is no effusion is no instability.  He has similar changes on x-ray to a greater degree in the left knee with more narrowing of the joint space and large osteophytes.  I do believe the pain originates from the arthritis.  Rather than the injection and like to try Voltaren gel.  He will try this for several weeks and if no improvement will consider lateral compartment cortisone injection  Follow-Up Instructions: Return if symptoms worsen or fail to improve.   Orders:  Orders Placed This Encounter  Procedures   XR KNEE 3 VIEW RIGHT   No orders of the defined types were placed in this encounter.     Procedures: No procedures performed   Clinical Data: No additional findings.   Subjective: Chief Complaint  Patient presents with   Right Knee - Pain  Recent onset of lateral right knee pain without injury or trauma.  No effusion no redness.  Does have a slight limp.  HPI  Review of Systems   Objective: Vital Signs: There were no vitals taken for this visit.  Physical Exam Constitutional:      Appearance: He is well-developed.  Eyes:     Pupils: Pupils are equal, round, and reactive to light.  Pulmonary:     Effort: Pulmonary effort is normal.  Skin:    General: Skin is warm and dry.  Neurological:     Mental Status: He is alert and oriented to person, place, and time.  Psychiatric:        Behavior: Behavior normal.     Ortho Exam  awake alert and oriented x3.  Comfortable sitting.  Right knee was not hot red warm or swollen and no effusion but there was tenderness directly over the anterior aspect of the lateral compartment.  No crepitation no instability.  No popliteal pain or mass.  No pain about the IT band of the proximal tibia  Specialty Comments:  No specialty comments available.  Imaging: XR KNEE 3 VIEW RIGHT  Result Date: 12/15/2021 Films of the right knee obtained in 3 projections.  There are some patellofemoral degenerative changes with osteophytes superiorly and inferiorly about the patella.  Joint spaces well-maintained.  There is some very mild narrowing of the lateral joint space consistent with lateral compartment arthritis.  No ectopic calcification or acute change.  Very small lateral compartment osteophytes.  Films are consistent with osteoarthritis    PMFS History: Patient Active Problem List   Diagnosis Date Noted   Pain in right knee 12/15/2021   Exocrine pancreatic insufficiency 12/03/2021   Abdominal tenderness in left flank 11/08/2021   Reactive thrombocytosis 09/17/2021   Lung nodule 07/25/2021   Chronic sinusitis 07/10/2021   Amnestic MCI (mild cognitive impairment with memory loss) 10/08/2020   Chronic hyperglycemia  06/29/2020   Failed total hip arthroplasty ([place]) 03/06/2019   Recurrent genital HSV (herpes simplex virus) infection 11/12/2018   Hypothyroidism 11/11/2018   Insomnia 11/10/2018   GERD (gastroesophageal reflux disease) 11/10/2018   Seasonal allergies 11/10/2018   Recurrent oral herpes simplex 09/23/2018   Age-related osteoporosis with current pathological fracture 03/11/2018   Flexural eczema 07/24/2017   Chronic right-sided low back pain without sciatica 07/21/2016   Superior mesenteric artery aneurysm ([place]) 07/12/2015   Left thyroid nodule    OSA (obstructive sleep apnea) 01/27/2013   Symptomatic PVCs 09/06/2012   BPH (benign prostatic hyperplasia) 09/06/2012    Obesity (BMI 30-39.9) 05/17/2006   Essential hypertension 05/17/2006   ALLERGIC RHINITIS 05/17/2006   OSTEOARTHRITIS 05/17/2006   Past Medical History:  Diagnosis Date   ALLERGIC RHINITIS    Cancer ([place])    Melonoma back   CELLULITIS, LEG, RIGHT    Recurrent R hip cellulitis 1/08,3/08    Diverticulitis    GERD (gastroesophageal reflux disease)    Gout    Hashimoto's thyroiditis    Hypertension    Hypogonadism male    low T, a/w ED   Hypothyroid    goiters    Left thyroid nodule 2008   on [place], decrease size in 2011 [place]   Migraines    Atypical and ocular   OSTEOARTHRITIS    Seasonal allergies    Sleep apnea    cpap    Family History  Problem Relation Age of Onset   Arthritis Father    Heart disease Father    Diabetes Father    Vascular Disease Father        AoBifem   Arthritis Mother    Diabetes Mother    Multiple sclerosis Mother    Cancer Paternal Grandmother        "GI"   Cancer Paternal Grandfather        stomach cancer   Thyroid cancer Sister    Uterine cancer Sister     Past Surgical History:  Procedure Laterality Date   CARPAL TUNNEL RELEASE Left 03/30/2017   Procedure: CARPAL TUNNEL RELEASE;  Surgeon: Garald Balding, MD;  Location: [facility];  Service: Orthopedics;  Laterality: Left;   CHOLECYSTECTOMY  2004   CTR Bilateral [place]   FOOT FUSION Left 06/22/2017   HEMORRHOID SURGERY N/A 03/30/2017   Procedure: HEMORRHOIDECTOMY;  Surgeon: Coralie Keens, MD;  Location: [facility];  Service: General;  Laterality: N/A;   open reduction L little finger Left 1970   Repair digital thumb, left Left 1990   Right shoulder cuff repair Right 09/15/11   Right shoulder SAD, DCR Right 02/05/11   TOTAL HIP ARTHROPLASTY Left 07/26/06   TOTAL HIP ARTHROPLASTY Right 1999   TOTAL HIP REVISION Left 03/07/2019   Procedure: LEFT TOTAL HIP REVISION POSTERIOR APPROACH;  Surgeon: Frederik Pear, MD;  Location: [facility];  Service:  Orthopedics;  Laterality: Left;   Social History   Occupational History   Occupation: Orthopedic PA    Employer: [employer]  Tobacco Use   Smoking status: Never   Smokeless tobacco: Never  Vaping Use   Vaping Use: Never used  Substance and Sexual Activity   Alcohol use: Yes    Alcohol/week: 0.0 standard drinks of alcohol    Comment: rare   Drug use: No   Sexual activity: Yes     Garald Balding, MD   Note - This record has been created using  Editor, commissioning.  Chart creation errors have been sought, but may not always  have been located. Such creation errors do not reflect on  the standard of medical care.

## 2021-12-16 ENCOUNTER — Other Ambulatory Visit: Payer: Self-pay | Admitting: Internal Medicine

## 2021-12-16 ENCOUNTER — Encounter: Payer: Self-pay | Admitting: Internal Medicine

## 2021-12-16 DIAGNOSIS — G4700 Insomnia, unspecified: Secondary | ICD-10-CM

## 2021-12-16 MED ORDER — TADALAFIL 5 MG PO TABS: 5.0000 mg | ORAL_TABLET | Freq: Every day | ORAL | 2 refills | Status: DC | PRN

## 2021-12-16 MED ORDER — TRAZODONE HCL 100 MG PO TABS: 100.0000 mg | ORAL_TABLET | Freq: Every evening | ORAL | 1 refills | Status: DC | PRN

## 2022-01-03 ENCOUNTER — Other Ambulatory Visit: Payer: Self-pay | Admitting: Internal Medicine

## 2022-01-03 ENCOUNTER — Encounter: Payer: Self-pay | Admitting: Internal Medicine

## 2022-01-03 ENCOUNTER — Encounter: Payer: Self-pay | Admitting: Physician Assistant

## 2022-01-03 DIAGNOSIS — K8681 Exocrine pancreatic insufficiency: Secondary | ICD-10-CM

## 2022-01-03 MED ORDER — CREON 24000-76000 UNITS PO CPEP: 1.0000 | ORAL_CAPSULE | Freq: Four times a day (QID) | ORAL | 1 refills | Status: DC

## 2022-01-10 ENCOUNTER — Encounter: Payer: Self-pay | Admitting: Gastroenterology

## 2022-01-19 ENCOUNTER — Ambulatory Visit (INDEPENDENT_AMBULATORY_CARE_PROVIDER_SITE_OTHER): Payer: Medicare Other | Admitting: Gastroenterology

## 2022-01-19 ENCOUNTER — Ambulatory Visit: Payer: Medicare Other | Admitting: Gastroenterology

## 2022-01-19 ENCOUNTER — Encounter: Payer: Self-pay | Admitting: Gastroenterology

## 2022-01-19 VITALS — BP 110/62 | HR 59 | Ht 70.0 in | Wt 252.0 lb

## 2022-01-19 DIAGNOSIS — R197 Diarrhea, unspecified: Secondary | ICD-10-CM | POA: Diagnosis not present

## 2022-01-19 DIAGNOSIS — R634 Abnormal weight loss: Secondary | ICD-10-CM

## 2022-01-19 DIAGNOSIS — K8681 Exocrine pancreatic insufficiency: Secondary | ICD-10-CM

## 2022-01-19 MED ORDER — CREON 24000-76000 UNITS PO CPEP: ORAL_CAPSULE | ORAL | 1 refills | Status: DC

## 2022-01-19 NOTE — Patient Instructions (Signed)
We have sent the following medications to your pharmacy for you to pick up at your convenience:Creon taking 2 capsules by mouth with meals and 2 capsules by mouth with snacks.   Call our office in 2 weeks if your symptoms have not improved.   Please follow up with Dr. Fuller Plan in 1 month.   The [facility] providers would like to encourage you to use [facility] to communicate with providers for non-urgent requests or questions.  Due to long hold times on the telephone, sending your provider a message by [facility] may be a faster and more efficient way to get a response.  Please allow 48 business hours for a response.  Please remember that this is for non-urgent requests.   Thank you for choosing me and [facility].  Pricilla Riffle. Dagoberto Ligas., MD., Marval Regal

## 2022-01-19 NOTE — Progress Notes (Signed)
Assessment    Presumed pancreatic exocrine insufficiency however with liquid stool it is possible his low fecal elastase is a false positive Mild LUQ abdominal tenderness, etiology unclear   Recommendations   Increase Creon to 2 with meals and 1 with snacks Continue a fat modified diet Contact us if symptoms are not significantly improved with increased Creon dose Consider colonoscopy if diarrhea does not resolve Consider EGD if LUQ abdominal pain persists REV in 1 month    HPI   Chief complaint: Diarrhea, weight loss, pancreatic insufficiency, LUQ pain  Patient profile:  Jonathan Powers is a 71 y.o. male referred by Scarlette Calico, MD for diarrhea, weight loss and pancreatic exocrine insufficiency.  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  He worked with Dr. Joni Fears for many years.  He relates a history of diarrhea and a progressive 70 lb weight loss over the past several months.  Evaluation by Dr. Ronnald Ramp included fecal elastase which was low and other stool studies were normal.  He notes persistent problems with mild LUQ abdominal pain that does not change with meals or bowel movements.  CTAP in October was unremarkable.  His history is somewhat limited due to his mild cognitive impairment. Denies constipation, change in stool caliber, melena, hematochezia, nausea, vomiting, dysphagia, reflux symptoms, chest pain.     Previous Labs / Imaging::    Latest Ref Rng & Units 11/08/2021    9:48 AM 09/15/2021    3:13 PM 09/12/2021    3:13 AM  CBC  WBC 4.0 - 10.5 K/uL 5.3  8.4  7.0   Hemoglobin 13.0 - 17.0 g/dL 14.9  13.6  12.8   Hematocrit 39.0 - 52.0 % 42.5  40.0  39.1   Platelets 150.0 - 400.0 K/uL 279.0  540.0  446     Lab Results  Component Value Date   LIPASE 26.0 11/08/2021      Latest Ref Rng & Units 11/08/2021    9:48 AM 09/12/2021    3:13 AM 09/11/2021    3:28 AM  CMP  Glucose 70 - 99 mg/dL 103  105  105   BUN 6 - 23 mg/dL '14  12  11   '$ Creatinine  0.40 - 1.50 mg/dL 1.07  0.98  0.92   Sodium 135 - 145 mEq/L 136  141  141   Potassium 3.5 - 5.1 mEq/L 4.0  4.1  3.9   Chloride 96 - 112 mEq/L 98  106  105   CO2 19 - 32 mEq/L 32  29  27   Calcium 8.4 - 10.5 mg/dL 9.2  8.6  8.9   Total Protein 6.0 - 8.3 g/dL 6.9     Total Bilirubin 0.2 - 1.2 mg/dL 0.7     Alkaline Phos 39 - 117 U/L 49     AST 0 - 37 U/L 17     ALT 0 - 53 U/L 14        Previous GI evaluation    Endoscopies:  Colonoscopy Jan 2013 reportedly normal ([facility]) Flex sig Aug 2015 reported showed internal hemorrhoids (Buccini)  Imaging:   CT AP 12/05/2021 1. No acute abnormality identified in the abdomen and pelvis. No kidney stone or hydronephrosis identified. 2. Simple cysts in bilateral kidneys.  No follow-up is recommended. 3. Aortic atherosclerosis.   Past Medical History:  Diagnosis Date   ALLERGIC RHINITIS    Cancer ([place])    Melonoma back   CELLULITIS, LEG, RIGHT  Recurrent R hip cellulitis 1/08,3/08    Diverticulitis    GERD (gastroesophageal reflux disease)    Gout    Hashimoto's thyroiditis    Hypertension    Hypogonadism male    low T, a/w ED   Hypothyroid    goiters    Left thyroid nodule 2008   on [place], decrease size in 2011 [place]   Migraines    Atypical and ocular   OSTEOARTHRITIS    Seasonal allergies    Sleep apnea    cpap   Past Surgical History:  Procedure Laterality Date   CARPAL TUNNEL RELEASE Left 03/30/2017   Procedure: CARPAL TUNNEL RELEASE;  Surgeon: Garald Balding, MD;  Location: [facility];  Service: Orthopedics;  Laterality: Left;   CHOLECYSTECTOMY  2004   CTR Bilateral [place]   FOOT FUSION Left 06/22/2017   HEMORRHOID SURGERY N/A 03/30/2017   Procedure: HEMORRHOIDECTOMY;  Surgeon: Coralie Keens, MD;  Location: [facility];  Service: General;  Laterality: N/A;   open reduction L little finger Left 1970   Repair digital thumb, left Left 1990   Right shoulder cuff repair  Right 09/15/11   Right shoulder SAD, DCR Right 02/05/11   TOTAL HIP ARTHROPLASTY Left 07/26/06   TOTAL HIP ARTHROPLASTY Right 1999   TOTAL HIP REVISION Left 03/07/2019   Procedure: LEFT TOTAL HIP REVISION POSTERIOR APPROACH;  Surgeon: Frederik Pear, MD;  Location: [facility];  Service: Orthopedics;  Laterality: Left;   Family History  Problem Relation Age of Onset   Arthritis Father    Heart disease Father    Diabetes Father    Vascular Disease Father        AoBifem   Arthritis Mother    Diabetes Mother    Multiple sclerosis Mother    Cancer Paternal Grandmother        "GI"   Cancer Paternal Grandfather        stomach cancer   Thyroid cancer Sister    Uterine cancer Sister    Social History   Tobacco Use   Smoking status: Never   Smokeless tobacco: Never  Vaping Use   Vaping Use: Never used  Substance Use Topics   Alcohol use: Yes    Alcohol/week: 0.0 standard drinks of alcohol    Comment: rare   Drug use: No   Current Outpatient Medications  Medication Sig Dispense Refill   acetaminophen (TYLENOL) 500 MG tablet Take 1,000 mg by mouth every 6 (six) hours as needed for moderate pain.      cetirizine (ZYRTEC) 10 MG tablet Take 10 mg by mouth daily.     esomeprazole (NEXIUM) 40 MG capsule Take 1 capsule (40 mg total) by mouth daily. 90 capsule 1   fluticasone (FLONASE) 50 MCG/ACT nasal spray Place 1 spray into both nostrils daily as needed for allergies.     ketoconazole (NIZORAL) 2 % cream Apply 1 Application topically daily as needed for irritation (rash).     levothyroxine (SYNTHROID) 175 MCG tablet Take 1 tablet (175 mcg total) by mouth daily before breakfast. 90 tablet 0   olmesartan-hydrochlorothiazide (BENICAR HCT) 40-25 MG tablet Take 1 tablet by mouth daily. As directed 90 tablet 3   rosuvastatin (CRESTOR) 5 MG tablet Take 1 tablet (5 mg total) by mouth daily. 90 tablet 1   tadalafil (CIALIS) 5 MG tablet Take 1 tablet (5 mg total) by mouth daily as needed for erectile  dysfunction. 10 tablet 2  traZODone (DESYREL) 100 MG tablet Take 1 tablet (100 mg total) by mouth at bedtime as needed. for sleep 90 tablet 1   No current facility-administered medications for this visit.   Allergies  Allergen Reactions   Nsaids Shortness Of Breath    Naproxen specifically causes SOB/wheeze tolerates topical diclofenac without problem Tylenol OK   Tolmetin Shortness Of Breath    Naproxen specifically causes SOB/wheeze tolerates topical diclofenac without problem Tylenol OK    Review of Systems: All other systems reviewed and negative except where noted in HPI.    Physical Exam    Wt Readings from Last 3 Encounters:  01/19/22 252 lb (114.3 kg)  11/08/21 251 lb (113.9 kg)  10/27/21 251 lb (113.9 kg)    BP 110/62   Pulse (!) 59   Ht '5\' 10"'$  (1.778 m)   Wt 252 lb (114.3 kg)   BMI 36.16 kg/m  Constitutional:  Generally well appearing male in no acute distress. HEENT: Pupils normal.  Conjunctivae are normal. No scleral icterus. No oral lesions or deformities noted.  Neck: supple.  Cardiac: Normal rate, regular rhythm. No murmurs. Pulmonary/chest: Effort normal and breath sounds normal. No wheezing, rales or rhonchi. Abdominal: Soft, nondistended, mild LUQ tenderness. Active bowel sounds. No palpable HSM, masses or hernias. Rectal: Not done Extremities: Without edema Neurological: Mild cognitive impairment. Alert and oriented to person, place and time. Psychiatric: Pleasant. Normal mood and affect. Behavior is normal. Skin: Skin is warm and dry. No rashes noted.  Lucio Edward, MD   cc:  Referring Provider [name], MD

## 2022-01-22 ENCOUNTER — Emergency Department (HOSPITAL_COMMUNITY)
Admission: EM | Admit: 2022-01-22 | Discharge: 2022-01-22 | Disposition: A | Discharge status: Home / Self Care | Payer: Medicare Other | Attending: Emergency Medicine | Admitting: Emergency Medicine

## 2022-01-22 ENCOUNTER — Encounter (HOSPITAL_COMMUNITY): Payer: Self-pay

## 2022-01-22 ENCOUNTER — Other Ambulatory Visit: Payer: Self-pay

## 2022-01-22 DIAGNOSIS — Z7901 Long term (current) use of anticoagulants: Secondary | ICD-10-CM | POA: Diagnosis not present

## 2022-01-22 DIAGNOSIS — T7840XA Allergy, unspecified, initial encounter: Secondary | ICD-10-CM | POA: Diagnosis not present

## 2022-01-22 MED ORDER — METHYLPREDNISOLONE SODIUM SUCC 125 MG IJ SOLR
125.0000 mg | Freq: Once | INTRAMUSCULAR | Status: AC
  Administered 2022-01-22: 125 mg via INTRAMUSCULAR
  Filled 2022-01-22: qty 2

## 2022-01-22 MED ORDER — FAMOTIDINE 20 MG PO TABS
40.0000 mg | ORAL_TABLET | Freq: Once | ORAL | Status: AC
  Administered 2022-01-22: 40 mg via ORAL
  Filled 2022-01-22: qty 2

## 2022-01-22 MED ORDER — DIPHENHYDRAMINE HCL 50 MG/ML IJ SOLN
50.0000 mg | Freq: Once | INTRAMUSCULAR | Status: AC
  Administered 2022-01-22: 50 mg via INTRAMUSCULAR
  Filled 2022-01-22: qty 1

## 2022-01-22 MED ORDER — EPINEPHRINE 0.3 MG/0.3ML IJ SOAJ: 0.3000 mg | INTRAMUSCULAR | 0 refills | Status: DC | PRN

## 2022-01-22 NOTE — ED Triage Notes (Addendum)
Patient reports that he woke with swollen lips and itching of the chin. Patient denies any difficulty swallowing or breathing.

## 2022-01-22 NOTE — ED Provider Notes (Signed)
[facility] DEPT Provider Note   CSN: [id number] Arrival date & time: 01/22/22  0913     History  Chief Complaint  Patient presents with   Allergic Reaction    Jonathan Powers is a 71 y.o. male.  71 year old male presents with allergic reaction which began this morning.  Notes edema to his lower lip.  Denies any ACE inhibitor use.  Unknown exposures.  Denies any rashes.  No pruritus.  No trouble swallowing.  Denies any dyspnea.  No treatment use prior to arrival.  Nothing recent symptoms better or worse       Home Medications Prior to Admission medications   Medication Sig Start Date End Date Taking? Authorizing Provider  acetaminophen (TYLENOL) 500 MG tablet Take 1,000 mg by mouth every 6 (six) hours as needed for moderate pain.     [provider]  cetirizine (ZYRTEC) 10 MG tablet Take 10 mg by mouth daily.    [provider]  esomeprazole (NEXIUM) 40 MG capsule Take 1 capsule (40 mg total) by mouth daily. 03/19/21   [name], MD  fluticasone (FLONASE) 50 MCG/ACT nasal spray Place 1 spray into both nostrils daily as needed for allergies. 07/06/21   [provider]  ketoconazole (NIZORAL) 2 % cream Apply 1 Application topically daily as needed for irritation (rash). 08/16/21   [provider]  levothyroxine (SYNTHROID) 175 MCG tablet Take 1 tablet (175 mcg total) by mouth daily before breakfast. 11/01/21   [name], MD  olmesartan-hydrochlorothiazide (BENICAR HCT) 40-25 MG tablet Take 1 tablet by mouth daily. As directed 03/05/21   Adrian Prows, MD  Pancrelipase, Lip-Prot-Amyl, (CREON) 24000-76000 units CPEP Take 2 capsules by mouth before meals and 2 capsules with snacks. 01/19/22   Ladene Artist, MD  rosuvastatin (CRESTOR) 5 MG tablet Take 1 tablet (5 mg total) by mouth daily. 11/01/21   [name], MD  tadalafil (CIALIS) 5 MG tablet Take 1 tablet (5 mg total) by mouth daily as needed for erectile  dysfunction. 12/16/21   [name], MD  traZODone (DESYREL) 100 MG tablet Take 1 tablet (100 mg total) by mouth at bedtime as needed. for sleep 12/16/21   [name], MD  apixaban (ELIQUIS) 2.5 MG TABS tablet Take 1 tablet (2.5 mg total) by mouth 2 (two) times daily. 03/07/19 05/16/19  Leighton Parody, PA-C      Allergies    Nsaids and Tolmetin    Review of Systems   Review of Systems  All other systems reviewed and are negative.   Physical Exam Updated Vital Signs BP 137/62 (BP Location: Left Arm)   Pulse 63   Temp 98.7 F (37.1 C) (Oral)   Resp 16   Ht 1.778 m ('5\' 10"'$ )   Wt 112.5 kg   SpO2 95%   BMI 35.58 kg/m  Physical Exam Vitals and nursing note reviewed.  Constitutional:      General: He is not in acute distress.    Appearance: Normal appearance. He is well-developed. He is not toxic-appearing.  HENT:     Head: Normocephalic and atraumatic.     Mouth/Throat:     Comments: Lower lip edema appreciated.  No stridor noted.  Tongue is normal. Eyes:     General: Lids are normal.     Conjunctiva/sclera: Conjunctivae normal.     Pupils: Pupils are equal, round, and reactive to light.  Neck:     Thyroid: No thyroid mass.  Trachea: No tracheal deviation.  Cardiovascular:     Rate and Rhythm: Normal rate and regular rhythm.     Heart sounds: Normal heart sounds. No murmur heard.    No gallop.  Pulmonary:     Effort: Pulmonary effort is normal. No respiratory distress.     Breath sounds: Normal breath sounds. No stridor. No decreased breath sounds, wheezing, rhonchi or rales.  Abdominal:     General: There is no distension.     Palpations: Abdomen is soft.     Tenderness: There is no abdominal tenderness. There is no rebound.  Musculoskeletal:        General: No tenderness. Normal range of motion.     Cervical back: Normal range of motion and neck supple.  Skin:    General: Skin is warm and dry.     Findings: No abrasion or rash.  Neurological:      Mental Status: He is alert and oriented to person, place, and time. Mental status is at baseline.     GCS: GCS eye subscore is 4. GCS verbal subscore is 5. GCS motor subscore is 6.     Cranial Nerves: No cranial nerve deficit.     Sensory: No sensory deficit.     Motor: Motor function is intact.  Psychiatric:        Attention and Perception: Attention normal.        Speech: Speech normal.        Behavior: Behavior normal.     ED Results / Procedures / Treatments   Labs (all labs ordered are listed, but only abnormal results are displayed) Labs Reviewed - No data to display  EKG None  Radiology No results found.  Procedures Procedures    Medications Ordered in ED Medications  diphenhydrAMINE (BENADRYL) injection 50 mg (has no administration in time range)  methylPREDNISolone sodium succinate (SOLU-MEDROL) 125 mg/2 mL injection 125 mg (has no administration in time range)  famotidine (PEPCID) tablet 40 mg (has no administration in time range)    ED Course/ Medical Decision Making/ A&P                           Medical Decision Making Risk Prescription drug management.   Patient has no signs of anaphylaxis at this time.  Was treated with Benadryl, Pepcid, Solu-Medrol.  No signs of angioedema.  No indication for giving epinephrine at this time.  Was monitored in lower lip swelling has improved.  Will discharge home with EpiPen prescription        Final Clinical Impression(s) / ED Diagnoses Final diagnoses:  None    Rx / DC Orders ED Discharge Orders     None         Lacretia Leigh, MD 01/22/22 1106

## 2022-01-25 DIAGNOSIS — L509 Urticaria, unspecified: Secondary | ICD-10-CM | POA: Diagnosis not present

## 2022-01-28 ENCOUNTER — Other Ambulatory Visit: Payer: Self-pay | Admitting: Internal Medicine

## 2022-01-28 DIAGNOSIS — E039 Hypothyroidism, unspecified: Secondary | ICD-10-CM

## 2022-02-07 ENCOUNTER — Ambulatory Visit: Payer: Medicare Other | Admitting: Physician Assistant

## 2022-02-17 DIAGNOSIS — L509 Urticaria, unspecified: Secondary | ICD-10-CM | POA: Diagnosis not present

## 2022-02-23 ENCOUNTER — Encounter: Payer: Self-pay | Admitting: Gastroenterology

## 2022-02-23 ENCOUNTER — Ambulatory Visit (INDEPENDENT_AMBULATORY_CARE_PROVIDER_SITE_OTHER): Payer: Medicare Other | Admitting: Gastroenterology

## 2022-02-23 VITALS — BP 136/86 | HR 58 | Ht 70.0 in | Wt 260.0 lb

## 2022-02-23 DIAGNOSIS — R1012 Left upper quadrant pain: Secondary | ICD-10-CM

## 2022-02-23 DIAGNOSIS — R197 Diarrhea, unspecified: Secondary | ICD-10-CM

## 2022-02-23 MED ORDER — NA SULFATE-K SULFATE-MG SULF 17.5-3.13-1.6 GM/177ML PO SOLN: 1.0000 | Freq: Once | ORAL | 0 refills | Status: AC

## 2022-02-23 NOTE — Progress Notes (Signed)
Assessment     Diarrhea and weight loss. Suspected pancreatic insufficiency however a falsely low fecal elastase is possible. R/O IBS, IBD, colorectal neoplasms.  Mild LUQ abdominal pain    Recommendations    Continue to hold Creon and assess symptoms.  If colonoscopy and EGD are unremarkable consider repeat fecal elastase with soft stool as opposed to watery diarrhea or resuming Creon. Schedule colonoscopy and EGD. The risks (including bleeding, perforation, infection, missed lesions, medication reactions and possible hospitalization or surgery if complications occur), benefits, and alternatives to colonoscopy with possible biopsy and possible polypectomy were discussed with the patient and they consent to proceed.  The risks (including bleeding, perforation, infection, missed lesions, medication reactions and possible hospitalization or surgery if complications occur), benefits, and alternatives to endoscopy with possible biopsy and possible dilation were discussed with the patient and they consent to proceed.     HPI    This is a 72 year old male with diarrhea and LUQ pain. CT AP in October 2023 showed small inguinal hernias containing fat and was otherwise unremarkable.  He stopped Creon with no significant change in diarrhea, currently 2 soft stools a day. His weight has increased to 260 pounds from 252 pounds in late November.  He describes his left upper quadrant pain is mild and fairly constant.  Does not appear to change with meals, bowel movements or positioning.   Labs / Imaging    CT AP 12/05/2021 1. No acute abnormality identified in the abdomen and pelvis. No kidney stone or hydronephrosis identified. 2. Simple cysts in bilateral kidneys.  No follow-up is recommended. 3. Aortic atherosclerosis.      Latest Ref Rng & Units 11/08/2021    9:48 AM 09/06/2021    3:30 AM 09/05/2021    2:56 PM  Hepatic Function  Total Protein 6.0 - 8.3 g/dL 6.9  6.3  7.5   Albumin 3.5 - 5.2  g/dL 3.9  3.2  3.8   AST 0 - 37 U/L _0 ALT 0 - 53 U/L _1 Alk Phosphatase 39 - 117 U/L 49  42  47   Total Bilirubin 0.2 - 1.2 mg/dL 0.7  0.8  0.9   Bilirubin, Direct 0.0 - 0.3 mg/dL 0.1          Latest Ref Rng & Units 11/08/2021    9:48 AM 09/15/2021    3:13 PM 09/12/2021    3:13 AM  CBC  WBC 4.0 - 10.5 K/uL 5.3  8.4  7.0   Hemoglobin 13.0 - 17.0 g/dL 14.9  13.6  12.8   Hematocrit 39.0 - 52.0 % 42.5  40.0  39.1   Platelets 150.0 - 400.0 K/uL 279.0  540.0  446     Current Medications, Allergies, Past Medical History, Past Surgical History, Family History and Social History were reviewed in [employer] record.   Physical Exam: General: Well developed, well nourished, no acute distress Head: Normocephalic and atraumatic Eyes: Sclerae anicteric, EOMI Ears: Normal auditory acuity Mouth: No deformities or lesions noted Lungs: Clear throughout to auscultation Heart: Regular rate and rhythm; No murmurs, rubs or bruits Abdomen: Soft, mild LUQ tenderness and non distended. No masses, hepatosplenomegaly or hernias noted. Normal Bowel sounds Rectal: Deferred to colonoscopy  Musculoskeletal: Symmetrical with no gross deformities  Pulses:  Normal pulses noted Extremities: No edema or deformities noted Neurological: Alert oriented x 4, grossly nonfocal Psychological:  Alert and cooperative.  Normal mood and affect   Ronell Boldin T. Fuller Plan, MD 02/23/2022, 10:33 AM

## 2022-02-23 NOTE — Patient Instructions (Signed)
You have been scheduled for an endoscopy and colonoscopy. Please follow the written instructions given to you at your visit today. Please pick up your prep supplies at the pharmacy within the next 1-3 days. If you use inhalers (even only as needed), please bring them with you on the day of your procedure.  The [facility] providers would like to encourage you to use MYCHART to communicate with providers for non-urgent requests or questions.  Due to long hold times on the telephone, sending your provider a message by MYCHART may be a faster and more efficient way to get a response.  Please allow 48 business hours for a response.  Please remember that this is for non-urgent requests.   Due to recent changes in healthcare laws, you may see the results of your imaging and laboratory studies on MyChart before your provider has had a chance to review them.  We understand that in some cases there may be results that are confusing or concerning to you. Not all laboratory results come back in the same time frame and the provider may be waiting for multiple results in order to interpret others.  Please give us 48 hours in order for your provider to thoroughly review all the results before contacting the office for clarification of your results.   Thank you for choosing me and [facility].  Malcolm T. Stark, Jr., MD., FACG  

## 2022-03-01 ENCOUNTER — Encounter: Payer: Self-pay | Admitting: Psychology

## 2022-03-01 DIAGNOSIS — R5382 Chronic fatigue, unspecified: Secondary | ICD-10-CM | POA: Insufficient documentation

## 2022-03-02 ENCOUNTER — Encounter: Payer: Self-pay | Admitting: Psychology

## 2022-03-02 ENCOUNTER — Encounter: Payer: Medicare Other | Admitting: Psychology

## 2022-03-02 DIAGNOSIS — Z029 Encounter for administrative examinations, unspecified: Secondary | ICD-10-CM

## 2022-03-03 ENCOUNTER — Other Ambulatory Visit (HOSPITAL_COMMUNITY): Payer: Self-pay

## 2022-03-05 ENCOUNTER — Other Ambulatory Visit: Payer: Self-pay | Admitting: Gastroenterology

## 2022-03-06 ENCOUNTER — Ambulatory Visit: Payer: Medicare Other | Admitting: Cardiology

## 2022-03-06 ENCOUNTER — Telehealth: Payer: Self-pay | Admitting: Gastroenterology

## 2022-03-06 MED ORDER — NA SULFATE-K SULFATE-MG SULF 17.5-3.13-1.6 GM/177ML PO SOLN: 1.0000 | Freq: Once | ORAL | 0 refills | Status: AC

## 2022-03-06 NOTE — Progress Notes (Deleted)
Primary Physician/Referring:  [name], MD  Referring Bo Merino, MD Patient ID: Jonathan Powers, male    DOB: 1950-12-24, 72 y.o.   MRN: [id number]  No chief complaint on file.  HPI:    Jonathan Powers  is a 72 y.o. Caucasian male with history of idiopathic gout, hypertension, mild hypertriglyceridemia and metabolic syndrome with elevated blood sugar without diabetes, moderate obesity, obstructive sleep apnea on CPAP, Coronary CTA on 11/19/2018 which revealed mild luminal irregularity and a calcium score of 250 (66th MESA database percentile). This is an annual visit.   ***    Past Medical History:  Diagnosis Date   Age-related osteoporosis with current pathological fracture 03/11/2018   Allergic rhinitis 05/17/2006   Amnestic MCI (mild cognitive impairment with memory loss) 10/08/2020   BPH (benign prostatic hyperplasia) 09/06/2012   Cellulitis, right leg    Recurrent R hip cellulitis 1/08,3/08    Chronic fatigue, unspecified    Chronic hyperglycemia 06/29/2020   Chronic right-sided low back pain without sciatica 07/21/2016   Chronic sinusitis 07/10/2021   Diverticulitis    Essential hypertension 05/17/2006   2014     Impression  Exercise Capacity:  Lexiscan with low level exercise.  BP Response:  Normal blood pressure response.  Clinical Symptoms:  No significant symptoms noted.  ECG Impression:  No significant ST segment change suggestive of ischemia.  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   Flexural eczema 07/24/2017   GERD (gastroesophageal reflux disease)    Gout    Hashimoto's thyroiditis    History of skin cancer    Melanoma on back   Hypogonadism male    low T, a/w ED   Hypothyroidism 11/11/2018   Insomnia 11/10/2018   Left thyroid nodule 2008   on  [place], decrease size in 2011 [place]   Lung nodule 07/25/2021   Migraines    Atypical and ocular   Obesity (BMI 30-39.9) 05/17/2006   OSA (obstructive sleep apnea) 01/27/2013   HST 01/2013:  AHI 68/hr with obstructive and central events.   09/2015 compliance report> > 4 hours 80% of days, used 25/30 days   Primary osteoarthritis 05/17/2006   Reactive thrombocytosis 09/17/2021   Recurrent genital HSV (herpes simplex virus) infection 11/12/2018   Superior mesenteric artery aneurysm 07/12/2015   DEC 2019     IMPRESSION:  VASCULAR     1. No acute findings.  2. 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  3. Ectatic abdominal aorta at risk for aneurysm development.  Recommend followup by ultrasound in 5 years. This recommendation  follows ACR consensus guidelines: White Paper of the ACR Incidental  Findings Committee II on Vascular Findings. J Am Coll Radiol 2013;  [phone].  4. 1.   Symptomatic PVCs 09/06/2012   Past Surgical History:  Procedure Laterality Date   CARPAL TUNNEL RELEASE Left 03/30/2017   Procedure: CARPAL TUNNEL RELEASE;  Surgeon: Garald Balding, MD;  Location: [facility];  Service: Orthopedics;  Laterality: Left;   CHOLECYSTECTOMY  2004   CTR Bilateral [place]   FOOT FUSION Left 06/22/2017   HEMORRHOID SURGERY N/A 03/30/2017   Procedure: HEMORRHOIDECTOMY;  Surgeon: Coralie Keens, MD;  Location: [place] [facility];  Service: General;  Laterality: N/A;  open reduction L little finger Left 1970   Repair digital thumb, left Left 1990   Right shoulder cuff repair Right 09/15/11   Right shoulder SAD, DCR Right 02/05/11   TOTAL HIP ARTHROPLASTY Left 07/26/06   TOTAL HIP ARTHROPLASTY Right 1999   TOTAL HIP REVISION Left 03/07/2019   Procedure: LEFT TOTAL HIP REVISION POSTERIOR APPROACH;  Surgeon: Frederik Pear, MD;  Location: [facility];  Service: Orthopedics;  Laterality: Left;   Social History   Tobacco Use   Smoking status: Never    Smokeless tobacco: Never  Substance Use Topics   Alcohol use: Yes    Alcohol/week: 0.0 standard drinks of alcohol    Comment: rare   Marital Status: Married   ROS  Review of Systems  Constitutional: Negative for weight gain.  Cardiovascular:  Positive for dyspnea on exertion (stable). Negative for chest pain and leg swelling.  Musculoskeletal:  Positive for arthritis, back pain, joint pain (neck and left shoulder) and neck pain.  Gastrointestinal:  Negative for melena.   Objective  There were no vitals taken for this visit. There is no height or weight on file to calculate BMI.     02/23/2022   10:22 AM 01/22/2022   11:12 AM 01/22/2022   11:00 AM  Vitals with BMI  Height 5' 10"$     Weight 260 lbs    BMI 99991111    Systolic [id number]  Diastolic 86 77 75  Pulse 58 53 52     Physical Exam Constitutional:      Appearance: He is obese.  Neck:     Vascular: No carotid bruit or JVD.  Cardiovascular:     Rate and Rhythm: Normal rate and regular rhythm.     Pulses: Intact distal pulses.     Heart sounds: Normal heart sounds. No murmur heard.    No gallop.  Pulmonary:     Effort: Pulmonary effort is normal.     Breath sounds: Normal breath sounds.  Abdominal:     General: Bowel sounds are normal.     Palpations: Abdomen is soft.  Musculoskeletal:        General: No swelling.    Laboratory examination:   Recent Labs    09/10/21 0325 09/11/21 0328 09/12/21 0313 11/08/21 0948  NA 141 141 141 136  K 3.5 3.9 4.1 4.0  CL 105 105 106 98  CO2 27 27 29 $ 32  GLUCOSE 107* 105* 105* 103*  BUN 11 11 12 14  $ CREATININE 0.93 0.92 0.98 1.07  CALCIUM 8.9 8.9 8.6* 9.2  GFRNONAA >60 >60 >60  --       Latest Ref Rng & Units 11/08/2021    9:48 AM 09/12/2021    3:13 AM 09/11/2021    3:28 AM  CMP  Glucose 70 - 99 mg/dL 103  105  105   BUN 6 - 23 mg/dL 14  12  11   $ Creatinine 0.40 - 1.50 mg/dL 1.07  0.98  0.92   Sodium 135 - 145 mEq/L 136  141  141   Potassium 3.5 - 5.1 mEq/L 4.0   4.1  3.9   Chloride 96 - 112 mEq/L 98  106  105   CO2 19 - 32 mEq/L 32  29  27   Calcium 8.4 - 10.5 mg/dL 9.2  8.6  8.9   Total Protein 6.0 - 8.3 g/dL 6.9     Total Bilirubin 0.2 - 1.2 mg/dL 0.7     Alkaline Phos 39 -  117 U/L 49     AST 0 - 37 U/L 17     ALT 0 - 53 U/L 14         Latest Ref Rng & Units 11/08/2021    9:48 AM 09/15/2021    3:13 PM 09/12/2021    3:13 AM  CBC  WBC 4.0 - 10.5 K/uL 5.3  8.4  7.0   Hemoglobin 13.0 - 17.0 g/dL 14.9  13.6  12.8   Hematocrit 39.0 - 52.0 % 42.5  40.0  39.1   Platelets 150.0 - 400.0 K/uL 279.0  540.0  446     Lipid Panel     Component Value Date/Time   CHOL 133 02/09/2021 1021   TRIG 145.0 02/09/2021 1021   HDL 42.50 02/09/2021 1021   CHOLHDL 3 02/09/2021 1021   VLDL 29.0 02/09/2021 1021   LDLCALC 62 02/09/2021 1021   LDLCALC 40 11/19/2018 0931   HEMOGLOBIN A1C Lab Results  Component Value Date   HGBA1C 5.3 09/08/2021   MPG 105.41 09/08/2021   TSH Recent Labs    11/08/21 0948  TSH 1.48   Medications & Allergies   Allergies  Allergen Reactions   Nsaids Shortness Of Breath    Naproxen specifically causes SOB/wheeze tolerates topical diclofenac without problem Tylenol OK   Tolmetin Shortness Of Breath    Naproxen specifically causes SOB/wheeze tolerates topical diclofenac without problem Tylenol OK     Current Outpatient Medications:    acetaminophen (TYLENOL) 500 MG tablet, Take 1,000 mg by mouth every 6 (six) hours as needed for moderate pain. , Disp: , Rfl:    cetirizine (ZYRTEC) 10 MG tablet, Take 10 mg by mouth daily. (Patient not taking: Reported on 02/23/2022), Disp: , Rfl:    EPINEPHrine 0.3 mg/0.3 mL IJ SOAJ injection, Inject 0.3 mg into the muscle as needed for anaphylaxis. (Patient not taking: Reported on 02/23/2022), Disp: 1 each, Rfl: 0   esomeprazole (NEXIUM) 40 MG capsule, Take 1 capsule (40 mg total) by mouth daily., Disp: 90 capsule, Rfl: 1   fluticasone (FLONASE) 50 MCG/ACT nasal spray, Place 1 spray into  both nostrils daily as needed for allergies., Disp: , Rfl:    ketoconazole (NIZORAL) 2 % cream, Apply 1 Application topically daily as needed for irritation (rash)., Disp: , Rfl:    levothyroxine (SYNTHROID) 175 MCG tablet, TAKE 1 TABLET BY MOUTH ONCE DAILY BEFORE BREAKFAST, Disp: 90 tablet, Rfl: 0   olmesartan-hydrochlorothiazide (BENICAR HCT) 40-25 MG tablet, Take 1 tablet by mouth daily. As directed, Disp: 90 tablet, Rfl: 3   Pancrelipase, Lip-Prot-Amyl, (CREON) 24000-76000 units CPEP, Take 2 capsules by mouth before meals and 2 capsules with snacks. (Patient not taking: Reported on 02/23/2022), Disp: 300 capsule, Rfl: 1   rosuvastatin (CRESTOR) 5 MG tablet, Take 1 tablet (5 mg total) by mouth daily., Disp: 90 tablet, Rfl: 1   tadalafil (CIALIS) 5 MG tablet, Take 1 tablet (5 mg total) by mouth daily as needed for erectile dysfunction., Disp: 10 tablet, Rfl: 2   traZODone (DESYREL) 100 MG tablet, Take 1 tablet (100 mg total) by mouth at bedtime as needed. for sleep, Disp: 90 tablet, Rfl: 1  Radiology:   Coronary CTA 11/11/2018:  1. Minimal non-obstructive CAD, CADRADS = 0. 2. Coronary calcium score of 250.78. This was 66th percentile for age and sex matched control. 3. Normal coronary origin with right dominance. 4. Atherosclerotic calcification of the aorta.  Chest x-ray 05/15/2019: Mild right basilar linear atelectasis without active cardiopulmonary disease.  Chest  x-ray 08/05/2019:  Poor inspiration. Possible mild right base atelectasis. No active disease suspected otherwise.  Cardiac Studies:   Lexiscan Myoview stress test 09/30/2012: No evidence of ischemia.  Normal   LVEF.  Echocardiogram 12/28/2018:  Left ventricle cavity is normal in size. Mild concentric hypertrophy of the left ventricle. Normal LV systolic function with EF 59%. Normal global wall motion. Doppler evidence of grade I (impaired) diastolic dysfunction, normal LAP.  Left atrial cavity is mildly dilated.  Mild tricuspid  regurgitation. Estimated pulmonary artery systolic pressure is 26 mmHg.  IVC is dilated with respiratory variation. Estimated RA pressure 8 mmHg.  Carotid artery duplex 03/18/2020: Doppler velocity suggests stenosis in the right external carotid artery (<50%).  Peak systolic velocities in the left bifurcation, internal, external and common carotid arteries are within normal limits. Antegrade right vertebral artery flow. Antegrade left vertebral artery flow. Follow up appropriate if clinically indicated. Right external carotid stenosis is the source of bruit.   EKG:  EKG 03/11/2021: Normal sinus rhythm at rate of 62 bpm.  No evidence of ischemia, normal QT interval.  Normal EKG.  No change from 03/04/2021.  Assessment     ICD-10-CM   1. Essential hypertension  I10     2. Agatston coronary artery calcium score between 200 and 399, MESA Percentile: 67  R93.1     3. Dyspnea on exertion  R06.09     4. Hyperglycemia  R73.9     5. Class 2 obesity due to excess calories without serious comorbidity with body mass index (BMI) of 37.0 to 37.9 in adult  E66.09    Z68.37       No orders of the defined types were placed in this encounter.  There are no discontinued medications.  No orders of the defined types were placed in this encounter.  Recommendations:   Jonathan Powers  is a 72 y.o. Caucasian male with history of idiopathic gout, hypertension, mild hypertriglyceridemia and metabolic syndrome with elevated blood sugar without diabetes, moderate obesity, obstructive sleep apnea on CPAP, Coronary CTA on 11/19/2018 which revealed mild luminal irregularity and a calcium score of 250 (66th MESA database percentile). This is an annual visit.   1. Essential hypertension ***  2. Agatston coronary artery calcium score between 200 and 399, MESA Percentile: 67 ***  3. Dyspnea on exertion ***  4. Hyperglycemia ***  5. Class 2 obesity due to excess calories without serious comorbidity with  body mass index (BMI) of 37.0 to 37.9 in adult ***    Adrian Prows, MD, [facility] 03/06/2022, 9:42 AM Office: [phone] Pager: [phone]

## 2022-03-06 NOTE — Telephone Encounter (Signed)
Inbound call from [facility] stating that patient called stating that he has lost his prep medication and needs a new prescription sent in for Suprep for his procedure on 1/18 at 3:00. Please advise.

## 2022-03-06 NOTE — Telephone Encounter (Signed)
Prep resent to patient's pharmacy. 

## 2022-03-07 ENCOUNTER — Ambulatory Visit: Payer: Medicare Other | Admitting: Cardiology

## 2022-03-07 ENCOUNTER — Encounter: Payer: Self-pay | Admitting: Cardiology

## 2022-03-07 VITALS — BP 134/80 | HR 64 | Ht 70.0 in | Wt 253.2 lb

## 2022-03-07 DIAGNOSIS — R931 Abnormal findings on diagnostic imaging of heart and coronary circulation: Secondary | ICD-10-CM

## 2022-03-07 DIAGNOSIS — I10 Essential (primary) hypertension: Secondary | ICD-10-CM | POA: Diagnosis not present

## 2022-03-07 NOTE — Progress Notes (Signed)
Primary Physician/Referring:  [name], MD  Referring Bo Merino, MD Patient ID: Jonathan Powers, male    DOB: Jul 27, 1950, 72 y.o.   MRN: [id number]  Chief Complaint  Patient presents with   Hypertension   Shortness of Breath   Hyperlipidemia   Follow-up    1 year Patient c/o chest pain    HPI:    Jonathan Powers  is a 72 y.o. Caucasian male with history of idiopathic gout, hypertension, mild hypertriglyceridemia and metabolic syndrome with elevated blood sugar without diabetes, moderate obesity, obstructive sleep apnea on CPAP, Coronary CTA on 11/19/2018 which revealed mild luminal irregularity and a calcium score of 250 (66th MESA database percentile). This is an annual visit.   He is asymptomatic, he has lost close to 45 to 50 pounds in weight since last office visit.  He is feeling the best he has in quite a while.  Denies dyspnea, leg edema.  Past Medical History:  Diagnosis Date   Age-related osteoporosis with current pathological fracture 03/11/2018   Allergic rhinitis 05/17/2006   Amnestic MCI (mild cognitive impairment with memory loss) 10/08/2020   BPH (benign prostatic hyperplasia) 09/06/2012   Cellulitis, right leg    Recurrent R hip cellulitis 1/08,3/08    Chronic fatigue, unspecified    Chronic hyperglycemia 06/29/2020   Chronic right-sided low back pain without sciatica 07/21/2016   Chronic sinusitis 07/10/2021   Diverticulitis    Essential hypertension 05/17/2006   2014     Impression  Exercise Capacity:  Lexiscan with low level exercise.  BP Response:  Normal blood pressure response.  Clinical Symptoms:  No significant symptoms noted.  ECG Impression:  No significant ST segment change suggestive of ischemia.  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   09/2015 compliance report> > 4 hours 80% of days, used 25/30 days   Primary osteoarthritis 05/17/2006   Reactive thrombocytosis 09/17/2021   Recurrent genital HSV (herpes simplex virus) infection 11/12/2018   Superior mesenteric artery aneurysm 07/12/2015   DEC 2019     IMPRESSION:  VASCULAR     1. No acute findings.  2. 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  3. Ectatic abdominal aorta at risk for aneurysm development.  Recommend followup by ultrasound in 5 years. This recommendation  follows ACR consensus guidelines: White Paper of the ACR Incidental  Findings Committee II on Vascular Findings. J Am Coll Radiol 2013;  [phone].  4. 1.   Symptomatic PVCs 09/06/2012   Past Surgical History:  Procedure Laterality Date   CARPAL TUNNEL RELEASE Left 03/30/2017   Procedure: CARPAL TUNNEL RELEASE;  Surgeon: Garald Balding, MD;  Location: [facility];  Service: Orthopedics;  Laterality: Left;   CHOLECYSTECTOMY  2004   CTR Bilateral [place]   FOOT FUSION Left 06/22/2017   HEMORRHOID SURGERY N/A 03/30/2017   Procedure: HEMORRHOIDECTOMY;  Surgeon: Coralie Keens, MD;  Location: [place];  Service: General;  Laterality: N/A;   open reduction L little finger Left 1970   Repair digital thumb, left Left 1990   Right shoulder cuff repair Right 09/15/11   Right shoulder SAD, DCR Right 02/05/11   TOTAL HIP ARTHROPLASTY Left 07/26/06   TOTAL  HIP ARTHROPLASTY Right 1999   TOTAL HIP REVISION Left 03/07/2019   Procedure: LEFT TOTAL HIP REVISION POSTERIOR APPROACH;  Surgeon: Frederik Pear, MD;  Location: [facility];  Service: Orthopedics;  Laterality: Left;   Social History   Tobacco Use   Smoking status: Never   Smokeless tobacco: Never  Substance Use Topics   Alcohol use: Yes    Alcohol/week: 0.0 standard drinks of alcohol    Comment: rare   Marital Status: Married   ROS  Review of Systems  Cardiovascular:  Negative for chest pain, dyspnea on exertion and leg swelling.   Objective  Blood pressure 134/80, pulse 64, height '5\' 10"'$  (1.778 m), weight 253 lb 3.2 oz (114.9 kg), SpO2 96 %. Body mass index is 36.33 kg/m.     03/07/2022    1:02 PM 02/23/2022   10:22 AM 01/22/2022   11:12 AM  Vitals with BMI  Height '5\' 10"'$  '5\' 10"'$    Weight 253 lbs 3 oz 260 lbs   BMI 15.94 58.59   Systolic 292 446 286  Diastolic 80 86 77  Pulse 64 58 53     Physical Exam Neck:     Vascular: No carotid bruit or JVD.  Cardiovascular:     Rate and Rhythm: Normal rate and regular rhythm.     Pulses: Intact distal pulses.     Heart sounds: Normal heart sounds. No murmur heard.    No gallop.  Pulmonary:     Effort: Pulmonary effort is normal.     Breath sounds: Normal breath sounds.  Abdominal:     General: Bowel sounds are normal.     Palpations: Abdomen is soft.  Musculoskeletal:     Right lower leg: No edema.     Left lower leg: No edema.    Laboratory examination:   Recent Labs    09/10/21 0325 09/11/21 0328 09/12/21 0313 11/08/21 0948  NA 141 141 141 136  K 3.5 3.9 4.1 4.0  CL 105 105 106 98  CO2 '27 27 29 '$ 32  GLUCOSE 107* 105* 105* 103*  BUN '11 11 12 14  '$ CREATININE 0.93 0.92 0.98 1.07  CALCIUM 8.9 8.9 8.6* 9.2  GFRNONAA >60 >60 >60  --       Latest Ref Rng & Units 11/08/2021    9:48 AM 09/12/2021    3:13 AM 09/11/2021    3:28 AM  CMP  Glucose 70 - 99 mg/dL 103  105  105   BUN 6 - 23 mg/dL '14  12  11   '$ Creatinine 0.40 -  1.50 mg/dL 1.07  0.98  0.92   Sodium 135 - 145 mEq/L 136  141  141   Potassium 3.5 - 5.1 mEq/L 4.0  4.1  3.9   Chloride 96 - 112 mEq/L 98  106  105   CO2 19 - 32 mEq/L 32  29  27   Calcium 8.4 -  10.5 mg/dL 9.2  8.6  8.9   Total Protein 6.0 - 8.3 g/dL 6.9     Total Bilirubin 0.2 - 1.2 mg/dL 0.7     Alkaline Phos 39 - 117 U/L 49     AST 0 - 37 U/L 17     ALT 0 - 53 U/L 14         Latest Ref Rng & Units 11/08/2021    9:48 AM 09/15/2021    3:13 PM 09/12/2021    3:13 AM  CBC  WBC 4.0 - 10.5 K/uL 5.3  8.4  7.0   Hemoglobin 13.0 - 17.0 g/dL 14.9  13.6  12.8   Hematocrit 39.0 - 52.0 % 42.5  40.0  39.1   Platelets 150.0 - 400.0 K/uL 279.0  540.0  446    Lab Results  Component Value Date   CHOL 133 02/09/2021   HDL 42.50 02/09/2021   LDLCALC 62 02/09/2021   TRIG 145.0 02/09/2021   CHOLHDL 3 02/09/2021     HEMOGLOBIN A1C Lab Results  Component Value Date   HGBA1C 5.3 09/08/2021   MPG 105.41 09/08/2021   TSH Recent Labs    11/08/21 0948  TSH 1.48   Medications & Allergies   Allergies  Allergen Reactions   Nsaids Shortness Of Breath    Naproxen specifically causes SOB/wheeze tolerates topical diclofenac without problem Tylenol OK   Tolmetin Shortness Of Breath    Naproxen specifically causes SOB/wheeze tolerates topical diclofenac without problem Tylenol OK     Current Outpatient Medications:    acetaminophen (TYLENOL) 500 MG tablet, Take 1,000 mg by mouth every 6 (six) hours as needed for moderate pain. , Disp: , Rfl:    cetirizine (ZYRTEC) 10 MG tablet, Take 10 mg by mouth daily., Disp: , Rfl:    esomeprazole (NEXIUM) 40 MG capsule, Take 1 capsule (40 mg total) by mouth daily., Disp: 90 capsule, Rfl: 1   fluticasone (FLONASE) 50 MCG/ACT nasal spray, Place 1 spray into both nostrils daily as needed for allergies., Disp: , Rfl:    ketoconazole (NIZORAL) 2 % cream, Apply 1 Application topically daily as needed for irritation (rash)., Disp: , Rfl:    levothyroxine  (SYNTHROID) 175 MCG tablet, TAKE 1 TABLET BY MOUTH ONCE DAILY BEFORE BREAKFAST, Disp: 90 tablet, Rfl: 0   olmesartan-hydrochlorothiazide (BENICAR HCT) 40-25 MG tablet, Take 1 tablet by mouth daily. As directed, Disp: 90 tablet, Rfl: 3   rosuvastatin (CRESTOR) 5 MG tablet, Take 1 tablet (5 mg total) by mouth daily., Disp: 90 tablet, Rfl: 1   tadalafil (CIALIS) 5 MG tablet, Take 1 tablet (5 mg total) by mouth daily as needed for erectile dysfunction., Disp: 10 tablet, Rfl: 2   traZODone (DESYREL) 100 MG tablet, Take 1 tablet (100 mg total) by mouth at bedtime as needed. for sleep, Disp: 90 tablet, Rfl: 1  Radiology:   Coronary CTA 11/11/2018:  1. Minimal non-obstructive CAD, CADRADS = 0. 2. Coronary calcium score of 250.78. This was 66th percentile for age and sex matched control. 3. Normal coronary origin with right dominance. 4. Atherosclerotic calcification of the aorta.  Chest x-ray 05/15/2019: Mild right basilar linear atelectasis without active cardiopulmonary disease.  Chest x-ray 08/05/2019:  Poor inspiration. Possible mild right base atelectasis. No active disease suspected otherwise.  Cardiac Studies:   Lexiscan Myoview stress test 09/30/2012: No evidence of ischemia.  Normal   LVEF.  Echocardiogram 12/28/2018:  Left ventricle cavity is normal in size. Mild concentric hypertrophy of the left  ventricle. Normal LV systolic function with EF 59%. Normal global wall motion. Doppler evidence of grade I (impaired) diastolic dysfunction, normal LAP.  Left atrial cavity is mildly dilated.  Mild tricuspid regurgitation. Estimated pulmonary artery systolic pressure is 26 mmHg.  IVC is dilated with respiratory variation. Estimated RA pressure 8 mmHg.  Carotid artery duplex 03/18/2020: Doppler velocity suggests stenosis in the right external carotid artery (<50%).  Peak systolic velocities in the left bifurcation, internal, external and common carotid arteries are within normal  limits. Antegrade right vertebral artery flow. Antegrade left vertebral artery flow. Follow up appropriate if clinically indicated. Right external carotid stenosis is the source of bruit.   EKG:  EKG 03/07/2022: Normal sinus rhythm at rate of 60 beats minute, normal axis.  No evidence of ischemia, normal EKG.  No change from 03/11/2021.  Assessment     ICD-10-CM   1. Essential hypertension  I10     2. Agatston coronary artery calcium score between 200 and 399, MESA Percentile: 67  R93.1 EKG 12-Lead      No orders of the defined types were placed in this encounter.  Medications Discontinued During This Encounter  Medication Reason   EPINEPHrine 0.3 mg/0.3 mL IJ SOAJ injection Patient Preference   Pancrelipase, Lip-Prot-Amyl, (CREON) 24000-76000 units CPEP Patient Preference    Orders Placed This Encounter  Procedures   EKG 12-Lead   Recommendations:   Jonathan Powers  is a 72 y.o. Caucasian male with history of idiopathic gout, hypertension, mild hypertriglyceridemia and metabolic syndrome with elevated blood sugar without diabetes, moderate obesity, obstructive sleep apnea on CPAP, Coronary CTA on 11/19/2018 which revealed mild luminal irregularity and a calcium score of 250 (66th MESA database percentile). This is an annual visit.   1. Essential hypertension Blood pressure excellent control.  Continue olmesartan HCT.  Labs reviewed, renal function normal.  2. Agatston coronary artery calcium score between 200 and 399, MESA Percentile: 67 Patient is presently on Crestor 5 mg daily and lipids in excellent control.  He has lost close to 50 pounds in weight since last office visit a year ago, I suspect his LDL will still further improve.  Patient remains asymptomatic without any dyspnea, no evidence of heart failure, I will see him back on a as needed basis.  Patient request that I see him back in couple years and this will be set up.   Adrian Prows, MD, [facility] 03/07/2022, 1:41  PM Office: [phone] Pager: [phone]

## 2022-03-09 ENCOUNTER — Encounter: Payer: Medicare Other | Admitting: Psychology

## 2022-03-09 ENCOUNTER — Ambulatory Visit (AMBULATORY_SURGERY_CENTER): Payer: Medicare Other | Admitting: Gastroenterology

## 2022-03-09 ENCOUNTER — Encounter: Payer: Self-pay | Admitting: Gastroenterology

## 2022-03-09 VITALS — BP 131/79 | HR 60 | Temp 97.5°F | Resp 14 | Ht 70.0 in | Wt 260.0 lb

## 2022-03-09 DIAGNOSIS — R197 Diarrhea, unspecified: Secondary | ICD-10-CM

## 2022-03-09 DIAGNOSIS — R634 Abnormal weight loss: Secondary | ICD-10-CM | POA: Diagnosis not present

## 2022-03-09 DIAGNOSIS — K3189 Other diseases of stomach and duodenum: Secondary | ICD-10-CM | POA: Diagnosis not present

## 2022-03-09 DIAGNOSIS — D124 Benign neoplasm of descending colon: Secondary | ICD-10-CM

## 2022-03-09 DIAGNOSIS — K2100 Gastro-esophageal reflux disease with esophagitis, without bleeding: Secondary | ICD-10-CM | POA: Diagnosis not present

## 2022-03-09 DIAGNOSIS — D122 Benign neoplasm of ascending colon: Secondary | ICD-10-CM

## 2022-03-09 DIAGNOSIS — D120 Benign neoplasm of cecum: Secondary | ICD-10-CM | POA: Diagnosis not present

## 2022-03-09 DIAGNOSIS — R1012 Left upper quadrant pain: Secondary | ICD-10-CM | POA: Diagnosis not present

## 2022-03-09 DIAGNOSIS — E039 Hypothyroidism, unspecified: Secondary | ICD-10-CM | POA: Diagnosis not present

## 2022-03-09 MED ORDER — SODIUM CHLORIDE 0.9 % IV SOLN: 500.0000 mL | Freq: Once | INTRAVENOUS | Status: DC

## 2022-03-09 NOTE — Progress Notes (Signed)
Vitals-DT  Pt's states no medical or surgical changes since previsit or office visit.  

## 2022-03-09 NOTE — Op Note (Signed)
[facility] Patient Name: Jonathan Powers Procedure Date: 03/09/2022 2:11 PM MRN: [id number] Endoscopist: Ladene Artist , MD, [id number] Age: 72 Referring MD:  Date of Birth: 1951-01-03 Gender: Male Account #: [id number] Procedure:                Upper GI endoscopy Indications:              Abdominal pain in the left upper quadrant, Diarrhea Medicines:                Monitored Anesthesia Care Procedure:                Pre-Anesthesia Assessment:                           - Prior to the procedure, a History and Physical                            was performed, and patient medications and                            allergies were reviewed. The patient's tolerance of                            previous anesthesia was also reviewed. The risks                            and benefits of the procedure and the sedation                            options and risks were discussed with the patient.                            All questions were answered, and informed consent                            was obtained. Prior Anticoagulants: The patient has                            taken no anticoagulant or antiplatelet agents. ASA                            Grade Assessment: II - A patient with mild systemic                            disease. After reviewing the risks and benefits,                            the patient was deemed in satisfactory condition to                            undergo the procedure.                           After obtaining informed consent, the endoscope was  passed under direct vision. Throughout the                            procedure, the patient's blood pressure, pulse, and                            oxygen saturations were monitored continuously. The                            GIF HQ190 #[id number] was introduced through the                            mouth, and advanced to the second part of duodenum.                             The upper GI endoscopy was accomplished without                            difficulty. The patient tolerated the procedure                            well. Scope In: Scope Out: Findings:                 LA Grade C (one or more mucosal breaks continuous                            between tops of 2 or more mucosal folds, less than                            75% circumference) esophagitis with no bleeding was                            found in the distal esophagus.                           One benign-appearing, intrinsic mild stenosis was                            found at the gastroesophageal junction. This                            stenosis measured 1.5 cm (inner diameter) x less                            than one cm (in length). The stenosis was traversed.                           The exam of the esophagus was otherwise normal.                           A medium-sized hiatal hernia was present.  The exam of the stomach was otherwise normal.                           Patchy mildly erythematous mucosa without active                            bleeding and with no stigmata of bleeding was found                            in the duodenal bulb and in the second portion of                            the duodenum. Biopsies for histology were taken                            with a cold forceps for evaluation of celiac                            disease.                           The exam of the duodenum was otherwise normal. Complications:            No immediate complications. Estimated Blood Loss:     Estimated blood loss was minimal. Impression:               - LA Grade C reflux esophagitis with no bleeding.                           - Benign-appearing esophageal stenosis.                           - Medium-sized hiatal hernia.                           - Erythematous duodenopathy. Biopsied. Recommendation:           - Patient has a contact number available for                             emergencies. The signs and symptoms of potential                            delayed complications were discussed with the                            patient. Return to normal activities tomorrow.                            Written discharge instructions were provided to the                            patient.                           - Resume previous diet.                           -  Follow antireflux measures long term.                           - Continue present medications including Nexium 40                            mg po qd.                           - Await pathology results. Ladene Artist, MD 03/09/2022 3:21:41 PM This report has been signed electronically.

## 2022-03-09 NOTE — Progress Notes (Signed)
See 02/23/2022 H&P, no changes

## 2022-03-09 NOTE — Op Note (Signed)
[facility] Patient Name: Segundo Makela Procedure Date: 03/09/2022 2:32 PM MRN: [id number] Endoscopist: Ladene Artist , MD, [id number] Age: 72 Referring MD:  Date of Birth: 01-Feb-1951 Gender: Male Account #: [id number] Procedure:                Colonoscopy Indications:              Chronic diarrhea, Weight loss Medicines:                Monitored Anesthesia Care Procedure:                Pre-Anesthesia Assessment:                           - Prior to the procedure, a History and Physical                            was performed, and patient medications and                            allergies were reviewed. The patient's tolerance of                            previous anesthesia was also reviewed. The risks                            and benefits of the procedure and the sedation                            options and risks were discussed with the patient.                            All questions were answered, and informed consent                            was obtained. Prior Anticoagulants: The patient has                            taken no anticoagulant or antiplatelet agents. ASA                            Grade Assessment: II - A patient with mild systemic                            disease. After reviewing the risks and benefits,                            the patient was deemed in satisfactory condition to                            undergo the procedure.                           After obtaining informed consent, the colonoscope  was passed under direct vision. Throughout the                            procedure, the patient's blood pressure, pulse, and                            oxygen saturations were monitored continuously. The                            Olympus CF-[id number]) Colonoscope was                            introduced through the anus and advanced to the the                            terminal ileum, with  identification of the                            appendiceal orifice and IC valve. The terminal                            ileum, ileocecal valve, appendiceal orifice, and                            rectum were photographed. The quality of the bowel                            preparation was good. The colonoscopy was performed                            without difficulty. The patient tolerated the                            procedure well. Scope In: 2:44:51 PM Scope Out: 3:03:32 PM Scope Withdrawal Time: 0 hours 15 minutes 54 seconds  Total Procedure Duration: 0 hours 18 minutes 41 seconds  Findings:                 The perianal and digital rectal examinations were                            normal.                           The terminal ileum appeared normal.                           Three sessile polyps were found in the descending                            colon, ascending colon and ileocecal valve. The                            polyps were 5 to 8 mm in size. These polyps were  removed with a cold snare. Resection and retrieval                            were complete.                           A few small-mouthed diverticula were found in the                            left colon. There was no evidence of diverticular                            bleeding.                           The exam was otherwise without abnormality on                            direct and retroflexion views. Random biopsies                            obtained throughout. Complications:            No immediate complications. Estimated blood loss:                            None. Estimated Blood Loss:     Estimated blood loss: none. Impression:               - Three 5 to 8 mm polyps in the descending colon,                            in the ascending colon and at the ileocecal valve,                            removed with a cold snare. Resected and retrieved.                            - Mild diverticulosis in the left colon.                           - Normal terminal ileum.                           - The examination was otherwise normal on direct                            and retroflexion views. Biopsied. Recommendation:           - Repeat colonoscopy after studies are complete for                            surveillance based on pathology results.                           - Patient has a contact number available for  emergencies. The signs and symptoms of potential                            delayed complications were discussed with the                            patient. Return to normal activities tomorrow.                            Written discharge instructions were provided to the                            patient.                           - Resume previous diet.                           - Continue present medications.                           - Await pathology results. Ladene Artist, MD 03/09/2022 3:16:27 PM This report has been signed electronically.

## 2022-03-09 NOTE — Progress Notes (Signed)
Report to PACU, RN, vss, BBS= Clear.  

## 2022-03-09 NOTE — Patient Instructions (Signed)
Resume previous diet and medications. Continue antireflux measures continue taking Nexium as prescribed indefinitely. Awaiting pathology results. Repeat Colonoscopy date to be determined based on pathology results.  Handouts provided on Colon polyps, Diverticulosis and Esophagitis  YOU HAD AN ENDOSCOPIC PROCEDURE TODAY AT [facility]:   Refer to the procedure report that was given to you for any specific questions about what was found during the examination.  If the procedure report does not answer your questions, please call your gastroenterologist to clarify.  If you requested that your care partner not be given the details of your procedure findings, then the procedure report has been included in a sealed envelope for you to review at your convenience later.  YOU SHOULD EXPECT: Some feelings of bloating in the abdomen. Passage of more gas than usual.  Walking can help get rid of the air that was put into your GI tract during the procedure and reduce the bloating. If you had a lower endoscopy (such as a colonoscopy or flexible sigmoidoscopy) you may notice spotting of blood in your stool or on the toilet paper. If you underwent a bowel prep for your procedure, you may not have a normal bowel movement for a few days.  Please Note:  You might notice some irritation and congestion in your nose or some drainage.  This is from the oxygen used during your procedure.  There is no need for concern and it should clear up in a day or so.  SYMPTOMS TO REPORT IMMEDIATELY:  Following lower endoscopy (colonoscopy or flexible sigmoidoscopy):  Excessive amounts of blood in the stool  Significant tenderness or worsening of abdominal pains  Swelling of the abdomen that is new, acute  Fever of 100F or higher  Following upper endoscopy (EGD)  Vomiting of blood or coffee ground material  New chest pain or pain under the shoulder blades  Painful or persistently difficult swallowing  New shortness  of breath  Fever of 100F or higher  Black, tarry-looking stools  For urgent or emergent issues, a gastroenterologist can be reached at any hour by calling [phone]. Do not use MyChart messaging for urgent concerns.    DIET:  We do recommend a small meal at first, but then you may proceed to your regular diet.  Drink plenty of fluids but you should avoid alcoholic beverages for 24 hours.  ACTIVITY:  You should plan to take it easy for the rest of today and you should NOT DRIVE or use heavy machinery until tomorrow (because of the sedation medicines used during the test).    FOLLOW UP: Our staff will call the number listed on your records the next business day following your procedure.  We will call around 7:15- 8:00 am to check on you and address any questions or concerns that you may have regarding the information given to you following your procedure. If we do not reach you, we will leave a message.     If any biopsies were taken you will be contacted by phone or by letter within the next 1-3 weeks.  Please call us at [phone] if you have not heard about the biopsies in 3 weeks.    SIGNATURES/CONFIDENTIALITY: You and/or your care partner have signed paperwork which will be entered into your electronic medical record.  These signatures attest to the fact that that the information above on your After Visit Summary has been reviewed and is understood.  Full responsibility of the confidentiality of this discharge information  lies with you and/or your care-partner.

## 2022-03-09 NOTE — Progress Notes (Signed)
Called to room to assist during endoscopic procedure.  Patient ID and intended procedure confirmed with present staff. Received instructions for my participation in the procedure from the performing physician.

## 2022-03-10 ENCOUNTER — Telehealth: Payer: Self-pay

## 2022-03-10 NOTE — Telephone Encounter (Signed)
  Follow up Call-     03/09/2022    2:17 PM 03/09/2022    2:14 PM  Call back number  Post procedure Call Back phone  # [phone]   Permission to leave phone message  Yes     Patient questions:  Do you have a fever, pain , or abdominal swelling? No. Pain Score  0 *  Have you tolerated food without any problems? Yes.    Have you been able to return to your normal activities? Yes.    Do you have any questions about your discharge instructions: Diet   No. Medications  No. Follow up visit  No.  Do you have questions or concerns about your Care? No.  Actions: * If pain score is 4 or above: No action needed, pain <4.

## 2022-03-17 ENCOUNTER — Ambulatory Visit: Payer: Self-pay | Admitting: Internal Medicine

## 2022-03-20 ENCOUNTER — Emergency Department (HOSPITAL_COMMUNITY)
Admission: EM | Admit: 2022-03-20 | Discharge: 2022-03-20 | Discharge status: Against Medical Advice | Payer: Medicare Other | Attending: Emergency Medicine | Admitting: Emergency Medicine

## 2022-03-20 ENCOUNTER — Other Ambulatory Visit: Payer: Self-pay

## 2022-03-20 DIAGNOSIS — R21 Rash and other nonspecific skin eruption: Secondary | ICD-10-CM | POA: Insufficient documentation

## 2022-03-20 DIAGNOSIS — Z5321 Procedure and treatment not carried out due to patient leaving prior to being seen by health care provider: Secondary | ICD-10-CM | POA: Insufficient documentation

## 2022-03-20 DIAGNOSIS — L539 Erythematous condition, unspecified: Secondary | ICD-10-CM | POA: Diagnosis present

## 2022-03-20 LAB — CBG MONITORING, ED: Glucose-Capillary: 174 mg/dL — ABNORMAL HIGH (ref 70–99)

## 2022-03-20 NOTE — ED Provider Triage Note (Signed)
Emergency Medicine Provider Triage Evaluation Note  Jonathan Powers , a 72 y.o. male  was evaluated in triage.  Pt complains of several days of itchy groin rash.  States that he has tried some over-the-counter medications but nothing for rash.  States that he tried to be seen a dermatologist but cannot get an appointment.  No fevers.  Denies history of diabetes.  Review of Systems  Positive: Groin rash Negative: Fever  Physical Exam  BP (!) 141/76   Pulse 74   Temp 98.6 F (37 C) (Oral)   Resp 17   Ht '5\' 10"'$  (1.778 m)   Wt 111.1 kg   SpO2 98%   BMI 35.15 kg/m  Gen:   Awake, no distress   Resp:  Normal effort  MSK:   Moves extremities without difficulty  Other:  Mild erythema groin creases  Medical Decision Making  Medically screening exam initiated at 3:56 PM.  Appropriate orders placed.  [name] was informed that the remainder of the evaluation will be completed by another provider, this initial triage assessment does not replace that evaluation, and the importance of remaining in the ED until their evaluation is complete.     Carlisle Cater, PA-C 03/20/22 1558

## 2022-03-20 NOTE — ED Triage Notes (Signed)
4-5 days rash in groin area, tried to see his Dermatologist and could not.

## 2022-03-20 NOTE — ED Notes (Signed)
Pt handed in his labels and sts he will follow up with his PCP

## 2022-03-23 ENCOUNTER — Encounter: Payer: Self-pay | Admitting: Gastroenterology

## 2022-03-28 ENCOUNTER — Telehealth: Payer: Self-pay

## 2022-03-28 NOTE — Telephone Encounter (Signed)
     Patient  visit on 1/29 Jonathan Powers  Have you been able to follow up with your primary care physician? No   The patient was or was not able to obtain any needed medicine or equipment. No   Are there diet recommendations that you are having difficulty following? No   Patient expresses understanding of discharge instructions and education provided has no other needs at this time.  No      [facility] [phone] [address], [place], [place] [address] Phone: [phone] Email: [email or web address]

## 2022-04-23 ENCOUNTER — Emergency Department (HOSPITAL_COMMUNITY): Payer: Medicare Other

## 2022-04-23 ENCOUNTER — Encounter (HOSPITAL_COMMUNITY): Payer: Self-pay

## 2022-04-23 ENCOUNTER — Emergency Department (HOSPITAL_COMMUNITY)
Admission: EM | Admit: 2022-04-23 | Discharge: 2022-04-23 | Disposition: A | Discharge status: Home / Self Care | Payer: Medicare Other | Attending: Emergency Medicine | Admitting: Emergency Medicine

## 2022-04-23 ENCOUNTER — Other Ambulatory Visit: Payer: Self-pay

## 2022-04-23 DIAGNOSIS — L03116 Cellulitis of left lower limb: Secondary | ICD-10-CM

## 2022-04-23 DIAGNOSIS — L089 Local infection of the skin and subcutaneous tissue, unspecified: Secondary | ICD-10-CM | POA: Diagnosis not present

## 2022-04-23 DIAGNOSIS — Z7901 Long term (current) use of anticoagulants: Secondary | ICD-10-CM | POA: Insufficient documentation

## 2022-04-23 DIAGNOSIS — I10 Essential (primary) hypertension: Secondary | ICD-10-CM | POA: Diagnosis not present

## 2022-04-23 DIAGNOSIS — M79605 Pain in left leg: Secondary | ICD-10-CM | POA: Diagnosis present

## 2022-04-23 DIAGNOSIS — Z79899 Other long term (current) drug therapy: Secondary | ICD-10-CM | POA: Diagnosis not present

## 2022-04-23 DIAGNOSIS — Z0389 Encounter for observation for other suspected diseases and conditions ruled out: Secondary | ICD-10-CM | POA: Diagnosis not present

## 2022-04-23 LAB — CBC WITH DIFFERENTIAL/PLATELET
Abs Immature Granulocytes: 0.02 10*3/uL (ref 0.00–0.07)
Basophils Absolute: 0 10*3/uL (ref 0.0–0.1)
Basophils Relative: 1 %
Eosinophils Absolute: 0.1 10*3/uL (ref 0.0–0.5)
Eosinophils Relative: 2 %
HCT: 43.9 % (ref 39.0–52.0)
Hemoglobin: 14.8 g/dL (ref 13.0–17.0)
Immature Granulocytes: 0 %
Lymphocytes Relative: 29 %
Lymphs Abs: 2.1 10*3/uL (ref 0.7–4.0)
MCH: 30.3 pg (ref 26.0–34.0)
MCHC: 33.7 g/dL (ref 30.0–36.0)
MCV: 90 fL (ref 80.0–100.0)
Monocytes Absolute: 0.6 10*3/uL (ref 0.1–1.0)
Monocytes Relative: 9 %
Neutro Abs: 4.4 10*3/uL (ref 1.7–7.7)
Neutrophils Relative %: 59 %
Platelets: 389 10*3/uL (ref 150–400)
RBC: 4.88 MIL/uL (ref 4.22–5.81)
RDW: 12.6 % (ref 11.5–15.5)
WBC: 7.4 10*3/uL (ref 4.0–10.5)
nRBC: 0 % (ref 0.0–0.2)

## 2022-04-23 LAB — COMPREHENSIVE METABOLIC PANEL
ALT: 54 U/L — ABNORMAL HIGH (ref 0–44)
AST: 41 U/L (ref 15–41)
Albumin: 3.6 g/dL (ref 3.5–5.0)
Alkaline Phosphatase: 72 U/L (ref 38–126)
Anion gap: 9 (ref 5–15)
BUN: 16 mg/dL (ref 8–23)
CO2: 25 mmol/L (ref 22–32)
Calcium: 8.7 mg/dL — ABNORMAL LOW (ref 8.9–10.3)
Chloride: 105 mmol/L (ref 98–111)
Creatinine, Ser: 0.84 mg/dL (ref 0.61–1.24)
GFR, Estimated: >60 mL/min (ref >60)
Glucose, Bld: 196 mg/dL — ABNORMAL HIGH (ref 70–99)
Potassium: 3.1 mmol/L — ABNORMAL LOW (ref 3.5–5.1)
Sodium: 139 mmol/L (ref 135–145)
Total Bilirubin: 0.5 mg/dL (ref 0.3–1.2)
Total Protein: 7.4 g/dL (ref 6.5–8.1)

## 2022-04-23 LAB — LACTIC ACID, PLASMA
Lactic Acid, Venous: 2.2 mmol/L (ref 0.5–1.9)
Lactic Acid, Venous: 1.7 mmol/L (ref 0.5–1.9)

## 2022-04-23 LAB — SEDIMENTATION RATE: Sed Rate: 38 mm/hr — ABNORMAL HIGH (ref 0–16)

## 2022-04-23 MED ORDER — POTASSIUM CHLORIDE CRYS ER 20 MEQ PO TBCR
40.0000 meq | EXTENDED_RELEASE_TABLET | Freq: Once | ORAL | Status: AC
  Administered 2022-04-23: 40 meq via ORAL
  Filled 2022-04-23: qty 2

## 2022-04-23 MED ORDER — LACTATED RINGERS IV BOLUS (SEPSIS)
1000.0000 mL | Freq: Once | INTRAVENOUS | Status: AC
  Administered 2022-04-23: 1000 mL via INTRAVENOUS

## 2022-04-23 MED ORDER — ACETAMINOPHEN 325 MG PO TABS
650.0000 mg | ORAL_TABLET | Freq: Once | ORAL | Status: AC
  Administered 2022-04-23: 650 mg via ORAL
  Filled 2022-04-23: qty 2

## 2022-04-23 MED ORDER — CEPHALEXIN 500 MG PO CAPS: 500.0000 mg | ORAL_CAPSULE | Freq: Four times a day (QID) | ORAL | 0 refills | Status: DC

## 2022-04-23 MED ORDER — SODIUM CHLORIDE 0.9 % IV SOLN
2.0000 g | Freq: Once | INTRAVENOUS | Status: AC
  Administered 2022-04-23: 2 g via INTRAVENOUS
  Filled 2022-04-23: qty 20

## 2022-04-23 NOTE — ED Provider Notes (Signed)
[facility] AT [facility] Provider Note   CSN: [id number] Arrival date & time: 04/23/22  1936     History  Chief Complaint  Patient presents with   Leg Pain    Jonathan Powers is a 72 y.o. male.   Leg Pain Patient presents for left leg pain, redness, swelling.  Medical history includes GERD, HTN, BPH, OSA, arthritis.  He was hospitalized for similar symptoms last year.  He was treated for cellulitis initially with ceftriaxone and vancomycin.  He had persistent symptoms after 6 days.  Infectious disease was consulted.  ID recommended Ancef and transition to oral cefadroxil for discharge purposes.  He received 8 days of IV antibiotics while in the hospital.  He was discharged on 5 days of cefadroxil.  Today, patient presents for similar symptoms that have been worsening over the past 3 days.  Location of the pain, swelling, and redness is the same as it was during a prior episode.  Pain is mild but skin is tender.  He has been able to ambulate without difficulty.  He denies any fevers or chills.  Prior to this current episode, patient did spend 2 weeks hiking.  He denies any known wounds or insect exposures.     Home Medications Prior to Admission medications   Medication Sig Start Date End Date Taking? Authorizing Provider  acetaminophen (TYLENOL) 500 MG tablet Take 1,000 mg by mouth every 6 (six) hours as needed for moderate pain.    Yes [provider]  cetirizine (ZYRTEC) 10 MG tablet Take 10 mg by mouth daily as needed for allergies.   Yes [provider]  esomeprazole (NEXIUM) 40 MG capsule Take 1 capsule (40 mg total) by mouth daily. Patient taking differently: Take 40 mg by mouth daily as needed (heartburn). 03/19/21  Yes [name], MD  fluticasone (FLONASE) 50 MCG/ACT nasal spray Place 1 spray into both nostrils daily as needed for allergies. 07/06/21  Yes [provider]  ketoconazole (NIZORAL) 2 % cream Apply 1  Application topically daily as needed for irritation (rash). 08/16/21  Yes [provider]  levothyroxine (SYNTHROID) 175 MCG tablet TAKE 1 TABLET BY MOUTH ONCE DAILY BEFORE BREAKFAST 01/28/22  Yes [name], MD  olmesartan-hydrochlorothiazide (BENICAR HCT) 40-25 MG tablet Take 1 tablet by mouth daily. As directed 03/05/21  Yes Adrian Prows, MD  rosuvastatin (CRESTOR) 5 MG tablet Take 1 tablet (5 mg total) by mouth daily. 11/01/21  Yes [name], MD  tadalafil (CIALIS) 5 MG tablet Take 1 tablet (5 mg total) by mouth daily as needed for erectile dysfunction. 12/16/21  Yes [name], MD  traZODone (DESYREL) 100 MG tablet Take 1 tablet (100 mg total) by mouth at bedtime as needed. for sleep 12/16/21  Yes [name], MD  cephALEXin (KEFLEX) 500 MG capsule Take 1 capsule (500 mg total) by mouth 4 (four) times daily. 04/23/22   Godfrey Pick, MD  apixaban (ELIQUIS) 2.5 MG TABS tablet Take 1 tablet (2.5 mg total) by mouth 2 (two) times daily. 03/07/19 05/16/19  Leighton Parody, PA-C      Allergies    Nsaids and Tolmetin    Review of Systems   Review of Systems  Cardiovascular:  Positive for leg swelling.  Skin:  Positive for color change.  All other systems reviewed and are negative.   Physical Exam Updated Vital Signs BP 125/65   Pulse (!) 55   Temp 98.5 F (36.9 C) (  Oral)   Resp 17   Ht '5\' 10"'$  (1.778 m)   Wt 106.1 kg   SpO2 98%   BMI 33.58 kg/m  Physical Exam Vitals and nursing note reviewed.  Constitutional:      General: He is not in acute distress.    Appearance: Normal appearance. He is well-developed. He is not ill-appearing, toxic-appearing or diaphoretic.  HENT:     Head: Normocephalic and atraumatic.     Right Ear: External ear normal.     Left Ear: External ear normal.     Nose: Nose normal.     Mouth/Throat:     Mouth: Mucous membranes are moist.  Eyes:     Extraocular Movements: Extraocular movements intact.     Conjunctiva/sclera: Conjunctivae  normal.  Cardiovascular:     Rate and Rhythm: Normal rate and regular rhythm.     Heart sounds: No murmur heard. Pulmonary:     Effort: Pulmonary effort is normal. No respiratory distress.  Abdominal:     General: There is no distension.     Palpations: Abdomen is soft.  Musculoskeletal:        General: Swelling and tenderness present. Normal range of motion.     Cervical back: Normal range of motion and neck supple.     Left lower leg: Edema present.  Skin:    General: Skin is warm and dry.     Findings: Erythema present.  Neurological:     General: No focal deficit present.     Mental Status: He is alert and oriented to person, place, and time.     Cranial Nerves: No cranial nerve deficit.     Sensory: No sensory deficit.     Motor: No weakness.     Coordination: Coordination normal.  Psychiatric:        Mood and Affect: Mood normal.        Behavior: Behavior normal.        Thought Content: Thought content normal.        Judgment: Judgment normal.     ED Results / Procedures / Treatments   Labs (all labs ordered are listed, but only abnormal results are displayed) Labs Reviewed  LACTIC ACID, PLASMA - Abnormal; Notable for the following components:      Result Value   Lactic Acid, Venous 2.2 (*)    All other components within normal limits  COMPREHENSIVE METABOLIC PANEL - Abnormal; Notable for the following components:   Potassium 3.1 (*)    Glucose, Bld 196 (*)    Calcium 8.7 (*)    ALT 54 (*)    All other components within normal limits  CULTURE, BLOOD (ROUTINE X 2)  CULTURE, BLOOD (ROUTINE X 2)  LACTIC ACID, PLASMA  CBC WITH DIFFERENTIAL/PLATELET  SEDIMENTATION RATE  C-REACTIVE PROTEIN    EKG None  Radiology DG Tibia/Fibula Left  Result Date: 04/23/2022 CLINICAL DATA:  Soft tissue infection redness and pain related to bilateral lower extremities. EXAM: LEFT TIBIA AND FIBULA - 2 VIEW COMPARISON:  None Available. FINDINGS: There is no evidence of fracture  or other focal bone lesions. Tricompartmental knee joint space narrowing prominent in the lateral tibiofemoral and patellofemoral compartments with small suprapatellar knee joint effusion. Extensive hardware of the ankle with talocalcaneal and talonavicular arthrodesis. IMPRESSION: 1. No acute fracture or dislocation. 2. Tricompartmental knee osteoarthritis with small suprapatellar knee joint effusion. Electronically Signed   By: Keane Police D.O.   On: 04/23/2022 22:22   DG Chest [facility] [address]  Result Date: 04/23/2022 CLINICAL DATA:  Possible sepsis EXAM: PORTABLE CHEST 1 VIEW COMPARISON:  11/08/2021 FINDINGS: The heart size and mediastinal contours are within normal limits. Both lungs are clear. The visualized skeletal structures are unremarkable. IMPRESSION: No active disease. Electronically Signed   By: Inez Catalina M.D.   On: 04/23/2022 20:31    Procedures Procedures    Medications Ordered in ED Medications  potassium chloride SA (KLOR-CON M) CR tablet 40 mEq (has no administration in time range)  lactated ringers bolus 1,000 mL (1,000 mLs Intravenous New Bag/Given 04/23/22 2114)  cefTRIAXone (ROCEPHIN) 2 g in sodium chloride 0.9 % 100 mL IVPB (0 g Intravenous Stopped 04/23/22 2104)  acetaminophen (TYLENOL) tablet 650 mg (650 mg Oral Given 04/23/22 2034)    ED Course/ Medical Decision Making/ A&P                             Medical Decision Making Amount and/or Complexity of Data Reviewed Labs: ordered. Radiology: ordered.  Risk OTC drugs. Prescription drug management.   This patient presents to the ED for concern of left leg pain, redness, swelling, this involves an extensive number of treatment options, and is a complaint that carries with it a high risk of complications and morbidity.  The differential diagnosis includes cellulitis, DVT, venous stasis   Co morbidities that complicate the patient evaluation  GERD, HTN, BPH, OSA, arthritis   Additional history  obtained:  Additional history obtained from N/A External records from outside source obtained and reviewed including EMR   Lab Tests:  I Ordered, and personally interpreted labs.  The pertinent results include: Initial lactic acid was slightly elevated at 2.2.  This normalized on repeat.  Patient had hypokalemia and mild hyperglycemia.  No leukocytosis is present.   Imaging Studies ordered:  I ordered imaging studies including x-ray of chest and left tip/fib I independently visualized and interpreted imaging which showed no acute findings I agree with the radiologist interpretation   Cardiac Monitoring: / EKG:  The patient was maintained on a cardiac monitor.  I personally viewed and interpreted the cardiac monitored which showed an underlying rhythm of: Sinus rhythm   Consultations Obtained:  I requested consultation with the hospitalist,  and discussed lab and imaging findings as well as pertinent plan - they recommend: Per protocol, no IV antibiotics are indicated.  If patient was admitted, he would receive oral antibiotics only.   Problem List / ED Course / Critical interventions / Medication management  Patient presents for recurrence of cellulitis to his distal left lower extremity.  He did require hospitalization last July.  He was treated for a total of 13 days of antibiotics.  At that time, he did have systemic symptoms.  He denies any recent fevers, chills, nausea.  Current skin changes have been progressing over the past 3 days.  On arrival in the ED, vital signs are normal.  Patient is well-appearing on exam.  He has erythema, warmth, swelling, and tenderness to left lower distal extremity.  Although DVT is on differential, currently ultrasound studies are not available at this time of night.  Given the very similar nature of his symptoms to his prior episode, will treat empirically for cellulitis.  Ceftriaxone was ordered.  Laboratory workup was initiated.  Tylenol was  ordered for analgesia.  Initial lactate was mildly elevated at 2.2.  Other notable lab findings include hypokalemia and mild hyperglycemia.  No leukocytosis was present.  Replacement  potassium was ordered.  Patient's second lactic acid was normal.  I had a shared decision-making discussion with the patient.  Patient does feel more comfortable with IV antibiotics.  I spoke with hospitalist, Dr. Alcario Drought, who states that, per protocol, IV antibiotics would not be indicated.  Patient was admitted, he would receive oral antibiotics only.  I further discussed this with the patient, who, given the circumstances, does prefer oral antibiotics at home.  Margins of cellulitis were marked with surgical pen.  Patient was advised to return for any worsening spread of cellulitic area or any development of systemic symptoms.  Keflex was prescribed.  Patient was discharged in stable condition. I ordered medication including IV fluids for hydration; ceftriaxone for cellulitis; potassium chloride for hypokalemia; Tylenol for analgesia Reevaluation of the patient after these medicines showed that the patient improved I have reviewed the patients home medicines and have made adjustments as needed   Social Determinants of Health:  Lives independently, medically literate         Final Clinical Impression(s) / ED Diagnoses Final diagnoses:  Cellulitis of left lower extremity    Rx / DC Orders ED Discharge Orders          Ordered    cephALEXin (KEFLEX) 500 MG capsule  4 times daily,   Status:  Discontinued        04/23/22 2313    cephALEXin (KEFLEX) 500 MG capsule  4 times daily        04/23/22 2315              Godfrey Pick, MD 04/23/22 2321

## 2022-04-23 NOTE — Discharge Instructions (Addendum)
There is a prescription for an antibiotic that is attached.  Take this as prescribed for treatment of skin infection.  The antibiotic you received tonight in the ER will cover you until tomorrow.  Return to the emergency department if you have worsening spread of the redness, painful, swollen area, or if you develop any new symptoms of fevers, chills, nausea, etc.  Your blood sugar was slightly elevated today.  Although this is not diagnostic of diabetes or prediabetes, this could be suggestive of it.  Please follow-up with your primary care doctor about this.

## 2022-04-23 NOTE — ED Triage Notes (Signed)
States that he has had redness and pain noted to bilateral lower extremities, worse on the left than the right. States this happened once before.

## 2022-04-24 ENCOUNTER — Other Ambulatory Visit: Payer: Self-pay

## 2022-04-24 ENCOUNTER — Emergency Department (HOSPITAL_COMMUNITY)
Admission: EM | Admit: 2022-04-24 | Discharge: 2022-04-24 | Disposition: A | Discharge status: Home / Self Care | Payer: Medicare Other | Attending: Emergency Medicine | Admitting: Emergency Medicine

## 2022-04-24 ENCOUNTER — Other Ambulatory Visit (HOSPITAL_COMMUNITY): Payer: Self-pay

## 2022-04-24 ENCOUNTER — Encounter (HOSPITAL_COMMUNITY): Payer: Self-pay | Admitting: Emergency Medicine

## 2022-04-24 DIAGNOSIS — L0390 Cellulitis, unspecified: Secondary | ICD-10-CM

## 2022-04-24 DIAGNOSIS — L03116 Cellulitis of left lower limb: Secondary | ICD-10-CM | POA: Diagnosis not present

## 2022-04-24 DIAGNOSIS — Z7901 Long term (current) use of anticoagulants: Secondary | ICD-10-CM | POA: Insufficient documentation

## 2022-04-24 DIAGNOSIS — M7989 Other specified soft tissue disorders: Secondary | ICD-10-CM | POA: Diagnosis present

## 2022-04-24 LAB — C-REACTIVE PROTEIN: CRP: 2.3 mg/dL — ABNORMAL HIGH (ref <1.0)

## 2022-04-24 MED ORDER — DEXTROSE 5 % IV SOLN
1500.0000 mg | Freq: Once | INTRAVENOUS | Status: AC
  Administered 2022-04-24: 1500 mg via INTRAVENOUS
  Filled 2022-04-24: qty 75

## 2022-04-24 NOTE — ED Provider Notes (Signed)
[facility] Provider Note   CSN: [id number] Arrival date & time: 04/24/22  0007     History  Chief Complaint  Patient presents with   Leg Swelling    Jonathan Powers is a 72 y.o. male.  Patient presents with concerns over swelling of his left leg.  Patient reports a history of cellulitis in his left leg that caused him to have an extended hospital stay last year.  He has had recurrence of symptoms including pain, swelling, redness and warmth of the lower leg.  He was seen at [facility].  He received IV antibiotics and was discharged.  Patient expresses concern that he will not respond to the oral antibiotics.       Home Medications Prior to Admission medications   Medication Sig Start Date End Date Taking? Authorizing Provider  acetaminophen (TYLENOL) 500 MG tablet Take 1,000 mg by mouth every 6 (six) hours as needed for moderate pain.     [provider]  cephALEXin (KEFLEX) 500 MG capsule Take 1 capsule (500 mg total) by mouth 4 (four) times daily. 04/23/22   Godfrey Pick, MD  cetirizine (ZYRTEC) 10 MG tablet Take 10 mg by mouth daily as needed for allergies.    [provider]  esomeprazole (NEXIUM) 40 MG capsule Take 1 capsule (40 mg total) by mouth daily. Patient taking differently: Take 40 mg by mouth daily as needed (heartburn). 03/19/21   [name], MD  fluticasone (FLONASE) 50 MCG/ACT nasal spray Place 1 spray into both nostrils daily as needed for allergies. 07/06/21   [provider]  ketoconazole (NIZORAL) 2 % cream Apply 1 Application topically daily as needed for irritation (rash). 08/16/21   [provider]  levothyroxine (SYNTHROID) 175 MCG tablet TAKE 1 TABLET BY MOUTH ONCE DAILY BEFORE BREAKFAST 01/28/22   [name], MD  olmesartan-hydrochlorothiazide (BENICAR HCT) 40-25 MG tablet Take 1 tablet by mouth daily. As directed 03/05/21   Adrian Prows, MD  rosuvastatin (CRESTOR)  5 MG tablet Take 1 tablet (5 mg total) by mouth daily. 11/01/21   [name], MD  tadalafil (CIALIS) 5 MG tablet Take 1 tablet (5 mg total) by mouth daily as needed for erectile dysfunction. 12/16/21   [name], MD  traZODone (DESYREL) 100 MG tablet Take 1 tablet (100 mg total) by mouth at bedtime as needed. for sleep 12/16/21   [name], MD  apixaban (ELIQUIS) 2.5 MG TABS tablet Take 1 tablet (2.5 mg total) by mouth 2 (two) times daily. 03/07/19 05/16/19  Leighton Parody, PA-C      Allergies    Nsaids and Tolmetin    Review of Systems   Review of Systems  Physical Exam Updated Vital Signs BP (!) 151/91 (BP Location: Right Arm)   Pulse (!) 59   Temp 98.3 F (36.8 C) (Oral)   Resp 18   Ht '5\' 10"'$  (1.778 m)   Wt 106.1 kg   SpO2 98%   BMI 33.58 kg/m  Physical Exam Vitals and nursing note reviewed.  Constitutional:      General: He is not in acute distress.    Appearance: He is well-developed.  HENT:     Head: Normocephalic and atraumatic.     Mouth/Throat:     Mouth: Mucous membranes are moist.  Eyes:     General: Vision grossly intact. Gaze aligned appropriately.     Extraocular Movements: Extraocular movements intact.  Conjunctiva/sclera: Conjunctivae normal.  Cardiovascular:     Rate and Rhythm: Normal rate and regular rhythm.     Pulses: Normal pulses.     Heart sounds: Normal heart sounds, S1 normal and S2 normal. No murmur heard.    No friction rub. No gallop.  Pulmonary:     Effort: Pulmonary effort is normal. No respiratory distress.     Breath sounds: Normal breath sounds.  Abdominal:     Palpations: Abdomen is soft.     Tenderness: There is no abdominal tenderness. There is no guarding or rebound.     Hernia: No hernia is present.  Musculoskeletal:        General: No swelling.     Cervical back: Full passive range of motion without pain, normal range of motion and neck supple. No pain with movement, spinous process tenderness or muscular  tenderness. Normal range of motion.     Right lower leg: No edema.     Left lower leg: Swelling and tenderness present. No edema.     Comments: Patient with swelling, edema, erythema, slight induration of the left lower leg.  Margin markers placed at [facility] long noted, erythema has extended beyond these markers.  Skin:    General: Skin is warm and dry.     Capillary Refill: Capillary refill takes less than 2 seconds.     Findings: Erythema present. No ecchymosis, lesion or wound.  Neurological:     Mental Status: He is alert and oriented to person, place, and time.     GCS: GCS eye subscore is 4. GCS verbal subscore is 5. GCS motor subscore is 6.     Cranial Nerves: Cranial nerves 2-12 are intact.     Sensory: Sensation is intact.     Motor: Motor function is intact. No weakness or abnormal muscle tone.     Coordination: Coordination is intact.  Psychiatric:        Mood and Affect: Mood normal.        Speech: Speech normal.        Behavior: Behavior normal.     ED Results / Procedures / Treatments   Labs (all labs ordered are listed, but only abnormal results are displayed) Labs Reviewed - No data to display  EKG None  Radiology DG Tibia/Fibula Left  Result Date: 04/23/2022 CLINICAL DATA:  Soft tissue infection redness and pain related to bilateral lower extremities. EXAM: LEFT TIBIA AND FIBULA - 2 VIEW COMPARISON:  None Available. FINDINGS: There is no evidence of fracture or other focal bone lesions. Tricompartmental knee joint space narrowing prominent in the lateral tibiofemoral and patellofemoral compartments with small suprapatellar knee joint effusion. Extensive hardware of the ankle with talocalcaneal and talonavicular arthrodesis. IMPRESSION: 1. No acute fracture or dislocation. 2. Tricompartmental knee osteoarthritis with small suprapatellar knee joint effusion. Electronically Signed   By: Keane Police D.O.   On: 04/23/2022 22:22   DG Chest Port 1 View  Result Date:  04/23/2022 CLINICAL DATA:  Possible sepsis EXAM: PORTABLE CHEST 1 VIEW COMPARISON:  11/08/2021 FINDINGS: The heart size and mediastinal contours are within normal limits. Both lungs are clear. The visualized skeletal structures are unremarkable. IMPRESSION: No active disease. Electronically Signed   By: Inez Catalina M.D.   On: 04/23/2022 20:31    Procedures Procedures    Medications Ordered in ED Medications  dalbavancin (DALVANCE) 1,500 mg in dextrose 5 % 500 mL IVPB (has no administration in time range)    ED Course/ Medical Decision  Making/ A&P                             Medical Decision Making  Differential diagnosis considered includes venous stasis, dermatitis, DVT, cellulitis.  Presentation is most consistent with cellulitis.  I reviewed his records and he did have an extended, complicated course with his last treatment.  When he was at [facility], Case was discussed with the hospitalist service and it was determined he did not qualify for hospitalization.  I shared the patient's concerns that he will not respond to the outpatient antibiotic treatment.  He was therefore treated with Dalvance.        Final Clinical Impression(s) / ED Diagnoses Final diagnoses:  Cellulitis    Rx / DC Orders ED Discharge Orders          Ordered    Ambulatory referral to Infectious Disease       Comments: Cellulitis patient:  Received dalbavancin on 04/24/2022.   04/24/22 0123              Orpah Greek, MD 04/24/22 [phone]

## 2022-04-24 NOTE — ED Triage Notes (Signed)
Pt c/o left lower leg redness and swelling since yesterday, reports he was seen at [facility] last night for same

## 2022-04-24 NOTE — Progress Notes (Signed)
Pharmacy Note:  Dalbavancin for Acute Bacterial Skin and Skin Structure Infection (ABSSSI) Patients to [facility] Discharge  Jonathan Powers is an 72 y.o. male who presented to [facility] on 04/24/2022 with an Acute Bacterial Skin and Skin Structure Infection  Inclusion criteria - Indication '[]'$  Moderately large skin lesion (>=75 cm2 or larger - about the size of a baseball) '[x]'$  Cellulitis  Patient was evaluated for the following exclusion criteria and NO exclusions were found:  Hardware involvement, Hypotension / shock, Elevated lactate (>2) without other explanation, gram-negative infection risk factors (bites, water exposure, infection after trauma, infection after skin graft, neutropenia, burns, severe immunocompromise), necrotizing fasciitis possible or confirmed, Known or suspected osteomyelitis or septic arthritis, endocarditis, diabetic foot infection, ischemic ulcers, post-operative wound infection, perirectal infections, need for drainage in the operating room, hand or facial infections, injection drug users with a fever, bacteremia, pregnancy or breastfeeding, allergy to related antibiotics like vancomycin, known liver disease (t.bili >2x ULN or AST/ALT 3x ULN)  Wynona Neat, PharmD, BCPS  04/24/2022, 1:24 AM Clinical Pharmacist

## 2022-04-26 ENCOUNTER — Encounter: Payer: Self-pay | Admitting: Internal Medicine

## 2022-04-27 ENCOUNTER — Encounter: Payer: Self-pay | Admitting: Radiology

## 2022-04-27 ENCOUNTER — Encounter: Payer: Self-pay | Admitting: Physician Assistant

## 2022-04-27 ENCOUNTER — Ambulatory Visit (INDEPENDENT_AMBULATORY_CARE_PROVIDER_SITE_OTHER): Payer: Medicare Other | Admitting: Physician Assistant

## 2022-04-27 VITALS — BP 144/77 | HR 60 | Resp 18 | Ht 70.0 in | Wt 244.0 lb

## 2022-04-27 DIAGNOSIS — G3184 Mild cognitive impairment, so stated: Secondary | ICD-10-CM | POA: Diagnosis not present

## 2022-04-27 DIAGNOSIS — R413 Other amnesia: Secondary | ICD-10-CM

## 2022-04-27 MED ORDER — MEMANTINE HCL 5 MG PO TABS: ORAL_TABLET | ORAL | 11 refills | Status: DC

## 2022-04-27 NOTE — Patient Instructions (Signed)
It was a pleasure to see you today at our office.   Recommendations:  Follow up in 6 months Start Memantine 5 mg tablets.  Take 1 tablet at bedtime for 2 weeks, then 1 tablet twice daily.   Side effects include dizziness, headache, diarrhea or constipation.  Call with any questions or concerns.  Neurocognitive testing on 08/2022  MRI brain       RECOMMENDATIONS FOR ALL PATIENTS WITH MEMORY PROBLEMS: 1. Continue to exercise (Recommend 30 minutes of walking everyday, or 3 hours every week) 2. Increase social interactions - continue going to [place] and enjoy social gatherings with friends and family 3. Eat healthy, avoid fried foods and eat more fruits and vegetables 4. Maintain adequate blood pressure, blood sugar, and blood cholesterol level. Reducing the risk of stroke and cardiovascular disease also helps promoting better memory. 5. Avoid stressful situations. Live a simple life and avoid aggravations. Organize your time and prepare for the next day in anticipation. 6. Sleep well, avoid any interruptions of sleep and avoid any distractions in the bedroom that may interfere with adequate sleep quality 7. Avoid sugar, avoid sweets as there is a strong link between excessive sugar intake, diabetes, and cognitive impairment We discussed the Mediterranean diet, which has been shown to help patients reduce the risk of progressive memory disorders and reduces cardiovascular risk. This includes eating fish, eat fruits and green leafy vegetables, nuts like almonds and hazelnuts, walnuts, and also use olive oil. Avoid fast foods and fried foods as much as possible. Avoid sweets and sugar as sugar use has been linked to worsening of memory function.  There is always a concern of gradual progression of memory problems. If this is the case, then we may need to adjust level of care according to patient needs. Support, both to the patient and caregiver, should then be put into place.     FALL  PRECAUTIONS: Be cautious when walking. Scan the area for obstacles that may increase the risk of trips and falls. When getting up in the mornings, sit up at the edge of the bed for a few minutes before getting out of bed. Consider elevating the bed at the head end to avoid drop of blood pressure when getting up. Walk always in a well-lit room (use night lights in the walls). Avoid area rugs or power cords from appliances in the middle of the walkways. Use a walker or a cane if necessary and consider physical therapy for balance exercise. Get your eyesight checked regularly.  FINANCIAL OVERSIGHT: Supervision, especially oversight when making financial decisions or transactions is also recommended.  HOME SAFETY: Consider the safety of the kitchen when operating appliances like stoves, microwave oven, and blender. Consider having supervision and share cooking responsibilities until no longer able to participate in those. Accidents with firearms and other hazards in the house should be identified and addressed as well.   ABILITY TO BE LEFT ALONE: If patient is unable to contact 911 operator, consider using LifeLine, or when the need is there, arrange for someone to stay with patients. Smoking is a fire hazard, consider supervision or cessation. Risk of wandering should be assessed by caregiver and if detected at any point, supervision and safe proof recommendations should be instituted.  MEDICATION SUPERVISION: Inability to self-administer medication needs to be constantly addressed. Implement a mechanism to ensure safe administration of the medications.   DRIVING: Regarding driving, in patients with progressive memory problems, driving will be impaired. We advise to have someone else  do the driving if trouble finding directions or if minor accidents are reported. Independent driving assessment is available to determine safety of driving.   If you are interested in the driving assessment, you can contact  the following:  [employer] in [place]  [place] [phone] or [phone]    [place] refers to food and lifestyle choices that are based on the traditions of countries located on the [employer]. This way of eating has been shown to help prevent certain conditions and improve outcomes for people who have chronic diseases, like kidney disease and heart disease. What are tips for following this plan? Lifestyle  Cook and eat meals together with your family, when possible. Drink enough fluid to keep your urine clear or pale yellow. Be physically active every day. This includes: Aerobic exercise like running or swimming. Leisure activities like gardening, walking, or housework. Get 7-8 hours of sleep each night. If recommended by your health care provider, drink red wine in moderation. This means 1 glass a day for nonpregnant women and 2 glasses a day for men. A glass of wine equals 5 oz (150 mL). Reading food labels  Check the serving size of packaged foods. For foods such as rice and pasta, the serving size refers to the amount of cooked product, not dry. Check the total fat in packaged foods. Avoid foods that have saturated fat or trans fats. Check the ingredients list for added sugars, such as corn syrup. Shopping  At the grocery store, buy most of your food from the areas near the walls of the store. This includes: Fresh fruits and vegetables (produce). Grains, beans, nuts, and seeds. Some of these may be available in unpackaged forms or large amounts (in bulk). Fresh seafood. Poultry and eggs. Low-fat dairy products. Buy whole ingredients instead of prepackaged foods. Buy fresh fruits and vegetables in-season from local farmers markets. Buy frozen fruits and vegetables in resealable bags. If you do not have access to  quality fresh seafood, buy precooked frozen shrimp or canned fish, such as tuna, salmon, or sardines. Buy small amounts of raw or cooked vegetables, salads, or olives from the deli or salad bar at your store. Stock your pantry so you always have certain foods on hand, such as olive oil, canned tuna, canned tomatoes, rice, pasta, and beans. Cooking  Cook foods with extra-virgin olive oil instead of using butter or other vegetable oils. Have meat as a side dish, and have vegetables or grains as your main dish. This means having meat in small portions or adding small amounts of meat to foods like pasta or stew. Use beans or vegetables instead of meat in common dishes like chili or lasagna. Experiment with different cooking methods. Try roasting or broiling vegetables instead of steaming or sauteing them. Add frozen vegetables to soups, stews, pasta, or rice. Add nuts or seeds for added healthy fat at each meal. You can add these to yogurt, salads, or vegetable dishes. Marinate fish or vegetables using olive oil, lemon juice, garlic, and fresh herbs. Meal planning  Plan to eat 1 vegetarian meal one day each week. Try to work up to 2 vegetarian meals, if possible. Eat seafood 2 or more times a week. Have healthy snacks readily available, such as: Vegetable sticks with hummus. Greek yogurt. Fruit and nut trail mix. Eat balanced meals throughout the week. This includes: Fruit: 2-3 servings a day Vegetables:  4-5 servings a day Low-fat dairy: 2 servings a day Fish, poultry, or lean meat: 1 serving a day Beans and legumes: 2 or more servings a week Nuts and seeds: 1-2 servings a day Whole grains: 6-8 servings a day Extra-virgin olive oil: 3-4 servings a day Limit red meat and sweets to only a few servings a month What are my food choices? Mediterranean diet Recommended Grains: Whole-grain pasta. Brown rice. Bulgar wheat. Polenta. Couscous. Whole-wheat bread. [place]. Vegetables:  Artichokes. Beets. Broccoli. Cabbage. Carrots. Eggplant. Green beans. Chard. Kale. Spinach. Onions. Leeks. Peas. Squash. Tomatoes. Peppers. Radishes. Fruits: Apples. Apricots. Avocado. Berries. Bananas. Cherries. Dates. Figs. Grapes. Lemons. Melon. Oranges. Peaches. Plums. Pomegranate. Meats and other protein foods: Beans. Almonds. Sunflower seeds. Pine nuts. Peanuts. Woodbine. Salmon. Scallops. Shrimp. [place]. Tilapia. Clams. Oysters. Eggs. Dairy: Low-fat milk. Cheese. Greek yogurt. Beverages: Water. Red wine. Herbal tea. Fats and oils: Extra virgin olive oil. Avocado oil. Grape seed oil. Sweets and desserts: [place] yogurt with honey. Baked apples. Poached pears. Trail mix. Seasoning and other foods: Basil. Cilantro. Coriander. Cumin. Mint. Parsley. Sage. Rosemary. Tarragon. Garlic. Oregano. Thyme. Pepper. Balsalmic vinegar. Tahini. Hummus. Tomato sauce. Olives. Mushrooms. Limit these Grains: Prepackaged pasta or rice dishes. Prepackaged cereal with added sugar. Vegetables: Deep fried potatoes (french fries). Fruits: Fruit canned in syrup. Meats and other protein foods: Beef. Pork. Lamb. Poultry with skin. Hot dogs. [place] Salines. Dairy: Ice cream. Sour cream. Whole milk. Beverages: Juice. Sugar-sweetened soft drinks. Beer. Liquor and spirits. Fats and oils: Butter. Canola oil. Vegetable oil. Beef fat (tallow). Lard. Sweets and desserts: Cookies. Cakes. Pies. Candy. Seasoning and other foods: Mayonnaise. Premade sauces and marinades. The items listed may not be a complete list. Talk with your dietitian about what dietary choices are right for you. Summary The Mediterranean diet includes both food and lifestyle choices. Eat a variety of fresh fruits and vegetables, beans, nuts, seeds, and whole grains. Limit the amount of red meat and sweets that you eat. Talk with your health care provider about whether it is safe for you to drink red wine in moderation. This means 1 glass a day for nonpregnant women and 2  glasses a day for men. A glass of wine equals 5 oz (150 mL). This information is not intended to replace advice given to you by your health care provider. Make sure you discuss any questions you have with your health care provider. Document Released: 09/30/2015 Document Revised: 11/02/2015 Document Reviewed: 09/30/2015 Elsevier Interactive Patient Education  2017 [employer].
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
## 2022-04-28 LAB — CULTURE, BLOOD (ROUTINE X 2)
Culture: NO GROWTH
Culture: NO GROWTH
Special Requests: ADEQUATE

## 2022-05-01 ENCOUNTER — Other Ambulatory Visit: Payer: Self-pay

## 2022-05-01 ENCOUNTER — Ambulatory Visit (INDEPENDENT_AMBULATORY_CARE_PROVIDER_SITE_OTHER): Payer: Medicare Other | Admitting: Infectious Diseases

## 2022-05-01 ENCOUNTER — Encounter: Payer: Self-pay | Admitting: Infectious Diseases

## 2022-05-01 VITALS — BP 137/83 | HR 54 | Temp 97.3°F | Ht 70.5 in | Wt 243.0 lb

## 2022-05-01 DIAGNOSIS — L03116 Cellulitis of left lower limb: Secondary | ICD-10-CM

## 2022-05-01 MED ORDER — CEFADROXIL 500 MG PO CAPS: 1000.0000 mg | ORAL_CAPSULE | Freq: Two times a day (BID) | ORAL | 0 refills | Status: AC

## 2022-05-01 NOTE — Assessment & Plan Note (Addendum)
Jonathan Powers is here for evaluation of left lower extremity cellulitis after receiving intravenous Dalvance infusion in the emergency room on March 4.  He has also since completed a course of cephalexin that ended a few days ago.  On exam there is no erythema, warmth.  He does have a large amount of scaling skin from when the swelling has decreased.  He does have some tenderness to palpation just above the ankle where there is still a fair amount of prominent swelling present.  Skin is hyperemic with venous congestion.  No further recommendations for antibiotics at the moment. I think he would do well with compression therapy up to the knee worn routinely.  He does have a risk factor of reconstructive surgery on the left foot that puts him at risk for lymphedema and swelling of the extremity recurrently. Also recommend ongoing elevation of the limb, moisturizer, cool compresses as needed for discomfort.   This was the second time he needed intravenous antibiotics in the course of 1 year for the same problem.  We discussed on-demand use of cefadroxil 1 g twice daily.  Discussed signs of recurrent cellulitis infection including swelling, tenderness, erythema discoloration of the skin and fever.  We discussed proper use and quick administration if he should suspect relapse to help prevent hospitalization.  I asked him to please contact me in the future if he needs to start the prescription so we can work on a plan for treatment.  He is welcome to follow-up as needed.

## 2022-05-01 NOTE — Patient Instructions (Signed)
Nice to meet you  No need for further antibiotics. Your leg is recovering nicely from infection.  Now is time to deal with the aftermath of the swelling and peeling skin.   I do recommend a compression stocking (15-20 mmHg to start) to the knee on your left leg. If it is comfortable to wear at night you can wear at night as well. Just take it off 2x a day for a skin check and lotion up!  Cool compresses for any pain you have. Compression I think will help it feel more supported as well.   I have given you a course of "on demand" antibiotics to have to keep with you incase you have a flare up of cellulitis again. This is the second time this has happened in < 1 year. The best way to help tend to it is to start the antibiotics early. For many redness, tingling, pain, itching of the skin are the prodrome symptoms of a flare.  Please let me know if you have to start the antibiotics and we can work on a plan. Cefadroxil 1 gm (2 pills) taken together twice a day for 10 days in the event you need it.

## 2022-05-01 NOTE — Progress Notes (Signed)
Patient: Jonathan Powers  DOB: 03-Apr-1950 MRN: [id number] PCP: [name], MD  Referring Provider: ER   Chief Complaint  Patient presents with   New Patient (Initial Visit)    Cellulitis      Patient Active Problem List   Diagnosis Date Noted   Chronic fatigue, unspecified    Exocrine pancreatic insufficiency 12/03/2021   Reactive thrombocytosis 09/17/2021   Lung nodule 07/25/2021   Chronic sinusitis 07/10/2021   Amnestic MCI (mild cognitive impairment with memory loss) 10/08/2020   Chronic hyperglycemia 06/29/2020   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   Hypothyroidism 11/11/2018   Cellulitis of left lower extremity 11/10/2018   Insomnia 11/10/2018   GERD (gastroesophageal reflux disease) 11/10/2018   Age-related osteoporosis with current pathological fracture 03/11/2018   Flexural eczema 07/24/2017   Chronic right-sided low back pain without sciatica 07/21/2016   Superior mesenteric artery aneurysm 07/12/2015   Left thyroid nodule    OSA (obstructive sleep apnea) 01/27/2013   Symptomatic PVCs 09/06/2012   BPH (benign prostatic hyperplasia) 09/06/2012   Obesity (BMI 30-39.9) 05/17/2006   Essential hypertension 05/17/2006   Allergic rhinitis 05/17/2006   Primary osteoarthritis 05/17/2006     Subjective:  Jonathan Powers is a 72 y.o. male here today for new patient visit to address cellulitis of the left lower extremity.   Amie Portland went to the emergency room on March 3 and again on March 4 for treatment of cellulitis of the left lower extremity.  In the past he was hospitalized for similar symptoms in July 2023, less than a year ago. At this hospital stay he received vancomycin and ceftriaxone IV, narrowed to Ancef then discharged on 5 additional days of cefadroxil.  With this recent episode he had similar presentation involving the same left lower extremity.  Started in the midshaft shin where he noticed some redness and tightness of the skin.  When he went  to the ER the first time he did not feel overtly bad yet.  Started feeling poorly after initial dose of IV antibiotics when he left [facility].  Came back to [facility] and received an additional dose of IV Dalvance.  Since that time the redness has receded, warmth has resolved, tenderness and swelling have persisted though better.  No pain with walking around and is able to tolerate regular activities well.  ER visit reviewed-received a dose of IV ceftriaxone 2 g.  Blood cultures were drawn and no growth, final.  Lactic acid 2.2 sed rate mildly elevated at 38 with ESR mildly elevated at 2.3.  X-ray of the tibia-fibula was unremarkable aside from tricompartmental knee osteoarthritis and a small joint effusion.  Oritavancin on March 4 and continued oral cephalexin 4 times daily a few days ago.   He has had a history of major reconstructive foot surgery on the same leg.  This was done at [facility] several years ago.  He does not recall having much swelling in the past to this extremity.  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  Review of Systems  All other systems reviewed and are negative.    Outpatient Medications Prior to Visit  Medication Sig Dispense Refill   acetaminophen (TYLENOL) 500 MG tablet Take 1,000 mg by mouth every 6 (six) hours as needed for moderate pain.      cetirizine (ZYRTEC) 10 MG tablet Take 10 mg by mouth daily as needed for allergies.     esomeprazole (NEXIUM) 40 MG capsule Take 1  capsule (40 mg total) by mouth daily. (Patient taking differently: Take 40 mg by mouth daily as needed (heartburn).) 90 capsule 1   fluticasone (FLONASE) 50 MCG/ACT nasal spray Place 1 spray into both nostrils daily as needed for allergies.     ketoconazole (NIZORAL) 2 % cream Apply 1 Application topically daily as needed for irritation (rash).     levothyroxine (SYNTHROID) 175 MCG tablet TAKE 1 TABLET BY MOUTH ONCE DAILY BEFORE BREAKFAST 90 tablet 0   memantine (NAMENDA) 5 MG tablet Take  1 tablet (5 mg at night) for 2 weeks, then increase to 1 tablet (5 mg) twice a day 60 tablet 11   olmesartan-hydrochlorothiazide (BENICAR HCT) 40-25 MG tablet Take 1 tablet by mouth daily. As directed 90 tablet 3   rosuvastatin (CRESTOR) 5 MG tablet Take 1 tablet (5 mg total) by mouth daily. 90 tablet 1   tadalafil (CIALIS) 5 MG tablet Take 1 tablet (5 mg total) by mouth daily as needed for erectile dysfunction. 10 tablet 2   Testosterone 1.62 % GEL      traZODone (DESYREL) 100 MG tablet Take 1 tablet (100 mg total) by mouth at bedtime as needed. for sleep 90 tablet 1   cephALEXin (KEFLEX) 500 MG capsule Take 1 capsule (500 mg total) by mouth 4 (four) times daily. (Patient not taking: Reported on 05/01/2022) 20 capsule 0   No facility-administered medications prior to visit.     Allergies  Allergen Reactions   Nsaids Shortness Of Breath    Naproxen specifically causes SOB/wheeze tolerates topical diclofenac without problem Tylenol OK   Tolmetin Shortness Of Breath    Naproxen specifically causes SOB/wheeze tolerates topical diclofenac without problem Tylenol OK    Social History   Tobacco Use   Smoking status: Never   Smokeless tobacco: Never  Vaping Use   Vaping Use: Never used  Substance Use Topics   Alcohol use: Yes    Alcohol/week: 0.0 standard drinks of alcohol    Comment: rare   Drug use: No    Family History  Problem Relation Age of Onset   Arthritis Father    Heart disease Father    Diabetes Father    Vascular Disease Father        AoBifem   Arthritis Mother    Diabetes Mother    Multiple sclerosis Mother    Cancer Paternal Grandmother        "GI"   Cancer Paternal Grandfather        stomach cancer   Thyroid cancer Sister    Uterine cancer Sister     Objective:   Vitals:   05/01/22 0946  BP: 137/83  Pulse: (!) 54  Temp: (!) 97.3 F (36.3 C)  TempSrc: Temporal  Weight: 243 lb (110.2 kg)  Height: 5' 10.5" (1.791 m)   Body mass index is 34.37  kg/m.  Physical Exam Vitals reviewed.  Constitutional:      Appearance: He is not ill-appearing.  Skin:    General: Skin is warm and dry.     Comments: Left lower extremity with a little bit of hyperemia but no redness.  Lots of dry flaking skin around the mid shin circumferentially.  Tenderness with assessing edema (2+).   Neurological:     Mental Status: He is alert.      Lab Results: Lab Results  Component Value Date   WBC 7.4 04/23/2022   HGB 14.8 04/23/2022   HCT 43.9 04/23/2022   MCV 90.0 04/23/2022  PLT 389 04/23/2022    Lab Results  Component Value Date   CREATININE 0.84 04/23/2022   BUN 16 04/23/2022   NA 139 04/23/2022   K 3.1 (L) 04/23/2022   CL 105 04/23/2022   CO2 25 04/23/2022    Lab Results  Component Value Date   ALT 54 (H) 04/23/2022   AST 41 04/23/2022   ALKPHOS 72 04/23/2022   BILITOT 0.5 04/23/2022     Assessment & Plan:   Problem List Items Addressed This Visit       Unprioritized   Cellulitis of left lower extremity - Primary    Gaspar Bidding is here for evaluation of left lower extremity cellulitis after receiving intravenous Dalvance infusion in the emergency room on March 4.  He has also since completed a course of cephalexin that ended a few days ago.  On exam there is no erythema, warmth.  He does have a large amount of scaling skin from when the swelling has decreased.  He does have some tenderness to palpation just above the ankle where there is still a fair amount of prominent swelling present.  Skin is hyperemic with venous congestion.  No further recommendations for antibiotics at the moment. I think he would do well with compression therapy up to the knee worn routinely.  He does have a risk factor of reconstructive surgery on the left foot that puts him at risk for lymphedema and swelling of the extremity recurrently. Also recommend ongoing elevation of the limb, moisturizer, cool compresses as needed for discomfort.   This was the  second time he needed intravenous antibiotics in the course of 1 year for the same problem.  We discussed on-demand use of cefadroxil 1 g twice daily.  Discussed signs of recurrent cellulitis infection including swelling, tenderness, erythema discoloration of the skin and fever.  We discussed proper use and quick administration if he should suspect relapse to help prevent hospitalization.  I asked him to please contact me in the future if he needs to start the prescription so we can work on a plan for treatment.  He is welcome to follow-up as needed.      Relevant Medications   cefadroxil (DURICEF) 500 MG capsule    Janene Madeira, MSN, NP-C [facility] for Infectious [place] Pager: [phone] Office: [phone]  05/01/22  10:59 AM

## 2022-05-03 ENCOUNTER — Telehealth: Payer: Self-pay

## 2022-05-03 NOTE — Telephone Encounter (Signed)
        Patient  visited [facility] 3/4    Telephone encounter attempt :  1st  A HIPAA compliant voice message was left requesting a return call.  Instructed patient to call back       [facility] [phone] [address], [place], [place] [address] Phone: [phone] Email: [email or web address]

## 2022-05-20 ENCOUNTER — Other Ambulatory Visit: Payer: Self-pay | Admitting: Internal Medicine

## 2022-05-20 ENCOUNTER — Other Ambulatory Visit: Payer: Self-pay | Admitting: Cardiology

## 2022-05-20 DIAGNOSIS — I10 Essential (primary) hypertension: Secondary | ICD-10-CM

## 2022-05-20 DIAGNOSIS — E039 Hypothyroidism, unspecified: Secondary | ICD-10-CM

## 2022-05-20 DIAGNOSIS — E785 Hyperlipidemia, unspecified: Secondary | ICD-10-CM

## 2022-05-22 ENCOUNTER — Ambulatory Visit
Admission: RE | Admit: 2022-05-22 | Discharge: 2022-05-22 | Disposition: A | Discharge status: Home / Self Care | Payer: Medicare Other | Source: Ambulatory Visit | Attending: Physician Assistant | Admitting: Physician Assistant

## 2022-05-22 DIAGNOSIS — G319 Degenerative disease of nervous system, unspecified: Secondary | ICD-10-CM | POA: Diagnosis not present

## 2022-05-22 DIAGNOSIS — J3489 Other specified disorders of nose and nasal sinuses: Secondary | ICD-10-CM | POA: Diagnosis not present

## 2022-05-22 DIAGNOSIS — R413 Other amnesia: Secondary | ICD-10-CM | POA: Diagnosis not present

## 2022-05-23 DIAGNOSIS — E291 Testicular hypofunction: Secondary | ICD-10-CM | POA: Diagnosis not present

## 2022-05-23 DIAGNOSIS — R948 Abnormal results of function studies of other organs and systems: Secondary | ICD-10-CM | POA: Diagnosis not present

## 2022-05-23 DIAGNOSIS — N5201 Erectile dysfunction due to arterial insufficiency: Secondary | ICD-10-CM | POA: Diagnosis not present

## 2022-05-26 DIAGNOSIS — L821 Other seborrheic keratosis: Secondary | ICD-10-CM | POA: Diagnosis not present

## 2022-05-26 DIAGNOSIS — D2262 Melanocytic nevi of left upper limb, including shoulder: Secondary | ICD-10-CM | POA: Diagnosis not present

## 2022-05-26 DIAGNOSIS — D225 Melanocytic nevi of trunk: Secondary | ICD-10-CM | POA: Diagnosis not present

## 2022-05-26 DIAGNOSIS — L570 Actinic keratosis: Secondary | ICD-10-CM | POA: Diagnosis not present

## 2022-05-26 DIAGNOSIS — L509 Urticaria, unspecified: Secondary | ICD-10-CM | POA: Diagnosis not present

## 2022-06-06 ENCOUNTER — Ambulatory Visit (INDEPENDENT_AMBULATORY_CARE_PROVIDER_SITE_OTHER): Payer: Medicare Other | Admitting: Orthopedic Surgery

## 2022-06-06 DIAGNOSIS — L309 Dermatitis, unspecified: Secondary | ICD-10-CM

## 2022-06-07 DIAGNOSIS — R21 Rash and other nonspecific skin eruption: Secondary | ICD-10-CM | POA: Diagnosis not present

## 2022-06-07 DIAGNOSIS — J3089 Other allergic rhinitis: Secondary | ICD-10-CM | POA: Diagnosis not present

## 2022-06-07 DIAGNOSIS — J300 Vasomotor rhinitis: Secondary | ICD-10-CM | POA: Diagnosis not present

## 2022-06-11 ENCOUNTER — Encounter: Payer: Self-pay | Admitting: Orthopedic Surgery

## 2022-06-11 ENCOUNTER — Other Ambulatory Visit: Payer: Self-pay | Admitting: Internal Medicine

## 2022-06-11 DIAGNOSIS — G4700 Insomnia, unspecified: Secondary | ICD-10-CM

## 2022-06-11 NOTE — Progress Notes (Signed)
Patient is seen in follow-up for ulceration interdigital webspaces of his foot.  Examination these are superficial there is no cellulitis no exposed bone or tendon there is no depth to the wound.  Patient was given samples of the Vive sock to place between the toes to wick away the moisture continue with wool socks.

## 2022-06-20 ENCOUNTER — Encounter (HOSPITAL_COMMUNITY): Payer: Self-pay | Admitting: *Deleted

## 2022-06-20 ENCOUNTER — Ambulatory Visit (HOSPITAL_COMMUNITY)
Admission: EM | Admit: 2022-06-20 | Discharge: 2022-06-20 | Disposition: A | Discharge status: Home / Self Care | Payer: Medicare Other | Attending: Physician Assistant | Admitting: Physician Assistant

## 2022-06-20 ENCOUNTER — Other Ambulatory Visit: Payer: Self-pay

## 2022-06-20 DIAGNOSIS — J40 Bronchitis, not specified as acute or chronic: Secondary | ICD-10-CM

## 2022-06-20 DIAGNOSIS — J329 Chronic sinusitis, unspecified: Secondary | ICD-10-CM | POA: Diagnosis not present

## 2022-06-20 DIAGNOSIS — R062 Wheezing: Secondary | ICD-10-CM | POA: Diagnosis not present

## 2022-06-20 MED ORDER — DOXYCYCLINE HYCLATE 100 MG PO CAPS: 100.0000 mg | ORAL_CAPSULE | Freq: Two times a day (BID) | ORAL | 0 refills | Status: DC

## 2022-06-20 MED ORDER — ALBUTEROL SULFATE HFA 108 (90 BASE) MCG/ACT IN AERS: 1.0000 | INHALATION_SPRAY | Freq: Four times a day (QID) | RESPIRATORY_TRACT | 0 refills | Status: DC | PRN

## 2022-06-20 NOTE — ED Triage Notes (Signed)
Pt reports productive cough for 48hrs. Sinus pain .

## 2022-06-20 NOTE — Discharge Instructions (Signed)
Take doxycycline 100 mg twice daily for 7 days.  This covers for sinus infection.  Stay out of the sun while on this medication.  Use over-the-counter medications including Mucinex, Flonase, Tylenol.  I also recommend nasal saline and sinus rinses for additional symptom relief.  You can use albuterol every 4-6 hours as needed for coughing fits and shortness of breath.  If your symptoms or not improving within a week please return for reevaluation.  If you have any worsening symptoms including worsening cough, shortness of breath, high fever, weakness you need to be seen immediately.

## 2022-06-20 NOTE — ED Provider Notes (Signed)
[facility]    CSN: [id number] Arrival date & time: 06/20/22  1544      History   Chief Complaint Chief Complaint  Patient presents with   Cough    HPI Jonathan Powers is a 72 y.o. male.   Patient presents today with a 4 to 5-day history of URI symptoms that have significantly worsened in the past 48 hours.  He reports cough, sinus pressure, congestion.  Denies any fever, nausea, vomiting, chest pain, shortness of breath.  Reports that he generally gets the symptoms a few times per year and requires antibiotics for resolution.  He has a history of chronic sinusitis as well as allergic rhinitis.  He has been taking over-the-counter antihistamines intermittently.  Denies history of asthma or COPD.  He does not smoke.  Denies any recent steroids and prefers to stay away from these types of medications as he is working to lose weight.  He has been treated with several antibiotics to treat cellulitis and has been followed by infectious disease.  Last antibiotics were approximately 6 weeks ago.  He has no concern for COVID or other viral infection.  He is concerned because his symptoms have significantly worsened and generally that means he needs an antibiotic.    Past Medical History:  Diagnosis Date   Age-related osteoporosis with current pathological fracture 03/11/2018   Allergic rhinitis 05/17/2006   Amnestic MCI (mild cognitive impairment with memory loss) 10/08/2020   BPH (benign prostatic hyperplasia) 09/06/2012   Cellulitis, right leg    Recurrent R hip cellulitis 1/08,3/08    Chronic fatigue, unspecified    Chronic hyperglycemia 06/29/2020   Chronic right-sided low back pain without sciatica 07/21/2016   Chronic sinusitis 07/10/2021   Diverticulitis    Essential hypertension 05/17/2006   2014     Impression  Exercise Capacity:  Lexiscan with low level exercise.  BP Response:  Normal blood pressure response.  Clinical Symptoms:  No significant symptoms noted.  ECG  Impression:  No significant ST segment change suggestive of ischemia.  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   09/2015 compliance report> > 4 hours 80% of days, used 25/30 days   Primary osteoarthritis 05/17/2006   Reactive thrombocytosis 09/17/2021   Recurrent genital HSV (herpes simplex virus) infection 11/12/2018   Superior mesenteric artery aneurysm 07/12/2015   DEC 2019     IMPRESSION:  VASCULAR     1. No acute findings.  2. 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  3. Ectatic abdominal aorta at risk for aneurysm development.  Recommend followup by ultrasound in 5 years. This recommendation  follows ACR consensus guidelines: White Paper of the ACR  Incidental  Findings Committee II on Vascular Findings. J Am Coll Radiol 2013;  [phone].  4. 1.   Symptomatic PVCs 09/06/2012    Patient Active Problem List   Diagnosis Date Noted   Chronic fatigue, unspecified    Exocrine pancreatic insufficiency 12/03/2021   Reactive thrombocytosis 09/17/2021   Lung nodule 07/25/2021   Chronic sinusitis 07/10/2021   Amnestic MCI (mild cognitive impairment with memory loss) 10/08/2020   Chronic hyperglycemia 06/29/2020   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   Hypothyroidism 11/11/2018    Cellulitis of left lower extremity 11/10/2018   Insomnia 11/10/2018   GERD (gastroesophageal reflux disease) 11/10/2018   Age-related osteoporosis with current pathological fracture 03/11/2018   Flexural eczema 07/24/2017   Chronic right-sided low back pain without sciatica 07/21/2016   Superior mesenteric artery aneurysm 07/12/2015   Left thyroid nodule    OSA (obstructive sleep apnea) 01/27/2013   Symptomatic PVCs 09/06/2012   BPH (benign prostatic hyperplasia) 09/06/2012   Obesity (BMI 30-39.9) 05/17/2006   Essential hypertension 05/17/2006   Allergic rhinitis 05/17/2006   Primary osteoarthritis 05/17/2006    Past Surgical History:  Procedure Laterality Date   CARPAL TUNNEL RELEASE Left 03/30/2017   Procedure: CARPAL TUNNEL RELEASE;  Surgeon: Valeria Batman, MD;  Location: [facility];  Service: Orthopedics;  Laterality: Left;   CHOLECYSTECTOMY  2004   CTR Bilateral 1982   CYSTOSCOPY  1974   FOOT FUSION Left 06/22/2017   HEMORRHOID SURGERY N/A 03/30/2017   Procedure: HEMORRHOIDECTOMY;  Surgeon: Abigail Miyamoto, MD;  Location: [facility];  Service: General;  Laterality: N/A;   open reduction L little finger Left 1970   Repair digital thumb, left Left 1990   Right shoulder cuff repair Right 09/15/11   Right shoulder SAD, DCR Right 02/05/11   TOTAL HIP ARTHROPLASTY Left 07/26/06   TOTAL HIP ARTHROPLASTY Right 1999   TOTAL HIP REVISION Left 03/07/2019   Procedure: LEFT TOTAL HIP REVISION POSTERIOR APPROACH;  Surgeon: Gean Birchwood, MD;  Location: [facility];  Service: Orthopedics;  Laterality: Left;       Home Medications    Prior to Admission medications   Medication Sig Start Date End Date Taking? Authorizing Provider  albuterol (VENTOLIN HFA) 108 (90 Base) MCG/ACT inhaler Inhale 1-2 puffs into the lungs every 6 (six) hours as needed for wheezing or shortness of breath. 06/20/22  Yes Yelitza Reach K, PA-C  doxycycline (VIBRAMYCIN) 100 MG capsule Take  1 capsule (100 mg total) by mouth 2 (two) times daily. 06/20/22  Yes Azim Gillingham, Noberto Retort, PA-C  acetaminophen (TYLENOL) 500 MG tablet Take 1,000 mg by mouth every 6 (six) hours as needed for moderate pain.     [provider]  cetirizine (ZYRTEC) 10 MG tablet Take 10 mg by mouth daily as needed for allergies.    [provider]  esomeprazole (NEXIUM) 40 MG capsule Take 1 capsule (40 mg total) by mouth daily. Patient taking differently: Take 40 mg by mouth daily as needed (heartburn). 03/19/21   [name], MD  fluticasone (FLONASE) 50 MCG/ACT nasal spray Place 1 spray into both nostrils daily as needed for allergies. 07/06/21   [provider]  ketoconazole (NIZORAL) 2 % cream Apply 1 Application topically daily as needed for irritation (rash). 08/16/21   [provider]  levothyroxine (SYNTHROID) 175 MCG tablet TAKE 1 TABLET BY MOUTH ONCE DAILY BEFORE BREAKFAST 01/28/22   [name], MD  memantine (NAMENDA) 5 MG tablet Take 1 tablet (5  mg at night) for 2 weeks, then increase to 1 tablet (5 mg) twice a day 04/27/22   Marcos Eke, PA-C  olmesartan-hydrochlorothiazide (BENICAR HCT) 40-25 MG tablet TAKE 1 TABLET BY MOUTH ONCE DAILY AS DIRECTED 05/22/22   Yates Decamp, MD  rosuvastatin (CRESTOR) 5 MG tablet Take 1 tablet (5 mg total) by mouth daily. 11/01/21   [name], MD  tadalafil (CIALIS) 5 MG tablet Take 1 tablet (5 mg total) by mouth daily as needed for erectile dysfunction. 12/16/21   [name], MD  Testosterone 1.62 % GEL  11/26/20   [provider]  traZODone (DESYREL) 100 MG tablet TAKE 1 TABLET BY MOUTH AT BEDTIME AS NEEDED FOR SLEEP 06/11/22   [name], MD  apixaban (ELIQUIS) 2.5 MG TABS tablet Take 1 tablet (2.5 mg total) by mouth 2 (two) times daily. 03/07/19 05/16/19  Allena Katz, PA-C    Family History Family History  Problem Relation Age of Onset   Arthritis Father    Heart disease Father    Diabetes Father    Vascular  Disease Father        AoBifem   Arthritis Mother    Diabetes Mother    Multiple sclerosis Mother    Cancer Paternal Grandmother        "GI"   Cancer Paternal Grandfather        stomach cancer   Thyroid cancer Sister    Uterine cancer Sister     Social History Social History   Tobacco Use   Smoking status: Never   Smokeless tobacco: Never  Vaping Use   Vaping Use: Never used  Substance Use Topics   Alcohol use: Yes    Alcohol/week: 0.0 standard drinks of alcohol    Comment: rare   Drug use: No     Allergies   Nsaids and Tolmetin   Review of Systems Review of Systems  Constitutional:  Positive for activity change. Negative for appetite change, fatigue and fever.  HENT:  Positive for congestion and sinus pressure. Negative for sneezing and sore throat.   Respiratory:  Positive for cough and chest tightness. Negative for shortness of breath and wheezing.   Cardiovascular:  Negative for chest pain.  Gastrointestinal:  Negative for abdominal pain, diarrhea, nausea and vomiting.  Neurological:  Negative for dizziness, light-headedness and headaches.     Physical Exam Triage Vital Signs ED Triage Vitals  Enc Vitals Group     BP 06/20/22 1655 (!) 154/86     Pulse Rate 06/20/22 1655 (!) 54     Resp 06/20/22 1655 20     Temp 06/20/22 1655 98.8 F (37.1 C)     Temp src --      SpO2 06/20/22 1655 98 %     Weight --      Height --      Head Circumference --      Peak Flow --      Pain Score 06/20/22 1653 4     Pain Loc --      Pain Edu? --      Excl. in GC? --    No data found.  Updated Vital Signs BP (!) 154/86   Pulse (!) 54   Temp 98.8 F (37.1 C)   Resp 20   SpO2 98%   Visual Acuity Right Eye Distance:   Left Eye Distance:   Bilateral Distance:    Right Eye Near:   Left Eye Near:  Bilateral Near:     Physical Exam Vitals reviewed.  Constitutional:      General: He is awake.     Appearance: Normal appearance. He is well-developed. He is  not ill-appearing.     Comments: Very pleasant male appears stated age in no acute distress sitting comfortably in exam room  HENT:     Head: Normocephalic and atraumatic.     Right Ear: Tympanic membrane, ear canal and external ear normal. Tympanic membrane is not erythematous or bulging.     Left Ear: Tympanic membrane, ear canal and external ear normal. Tympanic membrane is not erythematous or bulging.     Nose: Nose normal.     Mouth/Throat:     Pharynx: Uvula midline. Posterior oropharyngeal erythema present. No oropharyngeal exudate.  Cardiovascular:     Rate and Rhythm: Normal rate and regular rhythm.     Heart sounds: Normal heart sounds, S1 normal and S2 normal. No murmur heard. Pulmonary:     Effort: Pulmonary effort is normal. No accessory muscle usage or respiratory distress.     Breath sounds: No stridor. Wheezing present. No rhonchi or rales.     Comments: Scattered wheezing Neurological:     Mental Status: He is alert.  Psychiatric:        Behavior: Behavior is cooperative.      UC Treatments / Results  Labs (all labs ordered are listed, but only abnormal results are displayed) Labs Reviewed - No data to display  EKG   Radiology No results found.  Procedures Procedures (including critical care time)  Medications Ordered in UC Medications - No data to display  Initial Impression / Assessment and Plan / UC Course  I have reviewed the triage vital signs and the nursing notes.  Pertinent labs & imaging results that were available during my care of the patient were reviewed by me and considered in my medical decision making (see chart for details).     Patient is well-appearing, afebrile, nontoxic, nontachycardic.  Given recent double worsening of symptoms will cover for secondary bacterial infection with doxycycline 100 mg twice daily for 7 days.  Recommended that he continue over-the-counter medications including Mucinex and Flonase.  Also recommended nasal  saline and sinus rinses.  He did have some trace wheezing on exam and so was given an albuterol inhaler with instruction to use this every 4-6 hours as needed.  If his symptoms or not improved within a week he is to return for reevaluation.  If he has any worsening symptoms he needs to be seen immediately.  Final Clinical Impressions(s) / UC Diagnoses   Final diagnoses:  Sinobronchitis  Wheezing     Discharge Instructions      Take doxycycline 100 mg twice daily for 7 days.  This covers for sinus infection.  Stay out of the sun while on this medication.  Use over-the-counter medications including Mucinex, Flonase, Tylenol.  I also recommend nasal saline and sinus rinses for additional symptom relief.  You can use albuterol every 4-6 hours as needed for coughing fits and shortness of breath.  If your symptoms or not improving within a week please return for reevaluation.  If you have any worsening symptoms including worsening cough, shortness of breath, high fever, weakness you need to be seen immediately.     ED Prescriptions     Medication Sig Dispense Auth. Provider   doxycycline (VIBRAMYCIN) 100 MG capsule Take 1 capsule (100 mg total) by mouth 2 (two) times daily. 14  capsule Nichoel Digiulio K, PA-C   albuterol (VENTOLIN HFA) 108 (90 Base) MCG/ACT inhaler Inhale 1-2 puffs into the lungs every 6 (six) hours as needed for wheezing or shortness of breath. 18 g Madonna Flegal K, PA-C      PDMP not reviewed this encounter.   Jeani Hawking, PA-C 06/20/22 1725

## 2022-06-24 ENCOUNTER — Emergency Department (HOSPITAL_COMMUNITY): Payer: Medicare Other

## 2022-06-24 ENCOUNTER — Other Ambulatory Visit: Payer: Self-pay

## 2022-06-24 ENCOUNTER — Emergency Department (HOSPITAL_COMMUNITY)
Admission: EM | Admit: 2022-06-24 | Discharge: 2022-06-24 | Disposition: A | Discharge status: Home / Self Care | Payer: Medicare Other | Attending: Emergency Medicine | Admitting: Emergency Medicine

## 2022-06-24 ENCOUNTER — Encounter (HOSPITAL_COMMUNITY): Payer: Self-pay

## 2022-06-24 ENCOUNTER — Ambulatory Visit (HOSPITAL_COMMUNITY)
Admission: EM | Admit: 2022-06-24 | Discharge: 2022-06-24 | Disposition: A | Discharge status: Home / Self Care | Payer: Medicare Other

## 2022-06-24 DIAGNOSIS — R051 Acute cough: Secondary | ICD-10-CM | POA: Diagnosis not present

## 2022-06-24 DIAGNOSIS — I10 Essential (primary) hypertension: Secondary | ICD-10-CM | POA: Insufficient documentation

## 2022-06-24 DIAGNOSIS — Z20822 Contact with and (suspected) exposure to covid-19: Secondary | ICD-10-CM | POA: Diagnosis not present

## 2022-06-24 DIAGNOSIS — R7989 Other specified abnormal findings of blood chemistry: Secondary | ICD-10-CM | POA: Diagnosis not present

## 2022-06-24 DIAGNOSIS — Z7901 Long term (current) use of anticoagulants: Secondary | ICD-10-CM | POA: Diagnosis not present

## 2022-06-24 DIAGNOSIS — R001 Bradycardia, unspecified: Secondary | ICD-10-CM | POA: Diagnosis not present

## 2022-06-24 DIAGNOSIS — R0602 Shortness of breath: Secondary | ICD-10-CM | POA: Diagnosis not present

## 2022-06-24 DIAGNOSIS — R059 Cough, unspecified: Secondary | ICD-10-CM | POA: Diagnosis present

## 2022-06-24 LAB — CBC
HCT: 45.7 % (ref 39.0–52.0)
Hemoglobin: 15.2 g/dL (ref 13.0–17.0)
MCH: 29.9 pg (ref 26.0–34.0)
MCHC: 33.3 g/dL (ref 30.0–36.0)
MCV: 89.8 fL (ref 80.0–100.0)
Platelets: 308 10*3/uL (ref 150–400)
RBC: 5.09 MIL/uL (ref 4.22–5.81)
RDW: 12.7 % (ref 11.5–15.5)
WBC: 6.5 10*3/uL (ref 4.0–10.5)
nRBC: 0 % (ref 0.0–0.2)

## 2022-06-24 LAB — BASIC METABOLIC PANEL
Anion gap: 11 (ref 5–15)
BUN: 11 mg/dL (ref 8–23)
CO2: 27 mmol/L (ref 22–32)
Calcium: 9.2 mg/dL (ref 8.9–10.3)
Chloride: 101 mmol/L (ref 98–111)
Creatinine, Ser: 0.95 mg/dL (ref 0.61–1.24)
GFR, Estimated: >60 mL/min (ref >60)
Glucose, Bld: 102 mg/dL — ABNORMAL HIGH (ref 70–99)
Potassium: 4.1 mmol/L (ref 3.5–5.1)
Sodium: 139 mmol/L (ref 135–145)

## 2022-06-24 LAB — HEPATIC FUNCTION PANEL
ALT: 18 U/L (ref 0–44)
AST: 23 U/L (ref 15–41)
Albumin: 3.5 g/dL (ref 3.5–5.0)
Alkaline Phosphatase: 49 U/L (ref 38–126)
Bilirubin, Direct: 0.1 mg/dL (ref 0.0–0.2)
Indirect Bilirubin: 0.4 mg/dL (ref 0.3–0.9)
Total Bilirubin: 0.5 mg/dL (ref 0.3–1.2)
Total Protein: 6.4 g/dL — ABNORMAL LOW (ref 6.5–8.1)

## 2022-06-24 LAB — D-DIMER, QUANTITATIVE: D-Dimer, Quant: 1.86 ug/mL-FEU — ABNORMAL HIGH (ref 0.00–0.50)

## 2022-06-24 LAB — SARS CORONAVIRUS 2 BY RT PCR: SARS Coronavirus 2 by RT PCR: NEGATIVE

## 2022-06-24 LAB — BRAIN NATRIURETIC PEPTIDE: B Natriuretic Peptide: 61.8 pg/mL (ref 0.0–100.0)

## 2022-06-24 LAB — TROPONIN I (HIGH SENSITIVITY)
Troponin I (High Sensitivity): 20 ng/L — ABNORMAL HIGH (ref <18)
Troponin I (High Sensitivity): 20 ng/L — ABNORMAL HIGH (ref <18)

## 2022-06-24 MED ORDER — BENZONATATE 100 MG PO CAPS: 100.0000 mg | ORAL_CAPSULE | Freq: Three times a day (TID) | ORAL | 0 refills | Status: DC

## 2022-06-24 MED ORDER — METHYLPREDNISOLONE SODIUM SUCC 125 MG IJ SOLR
125.0000 mg | Freq: Once | INTRAMUSCULAR | Status: AC
  Administered 2022-06-24: 125 mg via INTRAVENOUS
  Filled 2022-06-24: qty 2

## 2022-06-24 MED ORDER — DIPHENHYDRAMINE HCL 50 MG/ML IJ SOLN
12.5000 mg | Freq: Once | INTRAMUSCULAR | Status: AC
  Administered 2022-06-24: 12.5 mg via INTRAVENOUS
  Filled 2022-06-24: qty 1

## 2022-06-24 MED ORDER — PREDNISONE 10 MG PO TABS: 20.0000 mg | ORAL_TABLET | Freq: Every day | ORAL | 0 refills | Status: DC

## 2022-06-24 MED ORDER — IPRATROPIUM-ALBUTEROL 0.5-2.5 (3) MG/3ML IN SOLN
3.0000 mL | Freq: Once | RESPIRATORY_TRACT | Status: AC
  Administered 2022-06-24: 3 mL via RESPIRATORY_TRACT
  Filled 2022-06-24: qty 3

## 2022-06-24 MED ORDER — MAGNESIUM SULFATE 2 GM/50ML IV SOLN
2.0000 g | Freq: Once | INTRAVENOUS | Status: AC
  Administered 2022-06-24: 2 g via INTRAVENOUS
  Filled 2022-06-24: qty 50

## 2022-06-24 MED ORDER — IPRATROPIUM-ALBUTEROL 0.5-2.5 (3) MG/3ML IN SOLN
6.0000 mL | Freq: Once | RESPIRATORY_TRACT | Status: AC
  Administered 2022-06-24: 6 mL via RESPIRATORY_TRACT
  Filled 2022-06-24: qty 6

## 2022-06-24 MED ORDER — ALBUTEROL SULFATE HFA 108 (90 BASE) MCG/ACT IN AERS
2.0000 | INHALATION_SPRAY | Freq: Once | RESPIRATORY_TRACT | Status: AC
  Administered 2022-06-24: 2 via RESPIRATORY_TRACT
  Filled 2022-06-24: qty 6.7

## 2022-06-24 MED ORDER — SODIUM CHLORIDE 0.9 % IV BOLUS: 500.0000 mL | Freq: Once | INTRAVENOUS | Status: DC

## 2022-06-24 MED ORDER — IOHEXOL 350 MG/ML SOLN
75.0000 mL | Freq: Once | INTRAVENOUS | Status: AC | PRN
  Administered 2022-06-24: 75 mL via INTRAVENOUS

## 2022-06-24 NOTE — ED Provider Notes (Signed)
EMERGENCY DEPARTMENT AT [facility] Provider Note   CSN: [id number] Arrival date & time: 06/24/22  1521     History  Chief Complaint  Patient presents with   Difficulty Breathing   Cough    Jonathan Powers is a 72 y.o. male history of hypertension, GERD presented with 5 days shortness of breath and wheezing.  Patient is been seen at the urgent care and given doxycycline and albuterol inhaler however states symptoms have gotten worse.  Patient states that he gets sinusitis symptoms every spring however this seems different than normal.  Patient dates that he is constantly wheezing at rest and is unsure of any alleviating symptoms.  Patient states he has a productive cough with foamy sputum but denies any fevers, nausea/vomiting, recent illnesses.  Patient denies any personal history of cancer, blood clots, smoking, PE/DVT, surgery/travel/hospitalization, leg swelling, chest pain.  Patient states that he is healthy and does not usually take any medications and has not been diagnosed with a respiratory disorders in the past.    Home Medications Prior to Admission medications   Medication Sig Start Date End Date Taking? Authorizing Provider  predniSONE (DELTASONE) 10 MG tablet Take 2 tablets (20 mg total) by mouth daily. 06/24/22  Yes Netta Corrigan, PA-C  acetaminophen (TYLENOL) 500 MG tablet Take 1,000 mg by mouth every 6 (six) hours as needed for moderate pain.     [provider]  albuterol (VENTOLIN HFA) 108 (90 Base) MCG/ACT inhaler Inhale 1-2 puffs into the lungs every 6 (six) hours as needed for wheezing or shortness of breath. 06/20/22   Raspet, Noberto Retort, PA-C  benzonatate (TESSALON) 100 MG capsule Take 1 capsule (100 mg total) by mouth every 8 (eight) hours. 06/24/22   Netta Corrigan, PA-C  cetirizine (ZYRTEC) 10 MG tablet Take 10 mg by mouth daily as needed for allergies.    [provider]  doxycycline (VIBRAMYCIN) 100 MG capsule Take 1 capsule  (100 mg total) by mouth 2 (two) times daily. 06/20/22   Raspet, Noberto Retort, PA-C  esomeprazole (NEXIUM) 40 MG capsule Take 1 capsule (40 mg total) by mouth daily. Patient taking differently: Take 40 mg by mouth daily as needed (heartburn). 03/19/21   [name], MD  fluticasone (FLONASE) 50 MCG/ACT nasal spray Place 1 spray into both nostrils daily as needed for allergies. 07/06/21   [provider]  ketoconazole (NIZORAL) 2 % cream Apply 1 Application topically daily as needed for irritation (rash). 08/16/21   [provider]  levothyroxine (SYNTHROID) 175 MCG tablet TAKE 1 TABLET BY MOUTH ONCE DAILY BEFORE BREAKFAST 01/28/22   [name], MD  memantine (NAMENDA) 5 MG tablet Take 1 tablet (5 mg at night) for 2 weeks, then increase to 1 tablet (5 mg) twice a day 04/27/22   Marcos Eke, PA-C  olmesartan-hydrochlorothiazide (BENICAR HCT) 40-25 MG tablet TAKE 1 TABLET BY MOUTH ONCE DAILY AS DIRECTED 05/22/22   Yates Decamp, MD  rosuvastatin (CRESTOR) 5 MG tablet Take 1 tablet (5 mg total) by mouth daily. 11/01/21   [name], MD  tadalafil (CIALIS) 5 MG tablet Take 1 tablet (5 mg total) by mouth daily as needed for erectile dysfunction. 12/16/21   [name], MD  Testosterone 1.62 % GEL  11/26/20   [provider]  traZODone (DESYREL) 100 MG tablet TAKE 1 TABLET BY MOUTH AT BEDTIME AS NEEDED FOR SLEEP 06/11/22   [name], MD  apixaban Everlene Balls)  2.5 MG TABS tablet Take 1 tablet (2.5 mg total) by mouth 2 (two) times daily. 03/07/19 05/16/19  Allena Katz, PA-C      Allergies    Nsaids and Tolmetin    Review of Systems   Review of Systems  Respiratory:  Positive for cough.     Physical Exam Updated Vital Signs BP 134/76   Pulse 71   Temp 98.5 F (36.9 C) (Oral)   Resp 12   Ht 5\' 11"  (1.803 m)   Wt 112 kg   SpO2 100%   BMI 34.45 kg/m  Physical Exam Vitals reviewed.  Constitutional:      General: He is not in acute distress. HENT:     Head:  Normocephalic and atraumatic.  Eyes:     Extraocular Movements: Extraocular movements intact.     Conjunctiva/sclera: Conjunctivae normal.     Pupils: Pupils are equal, round, and reactive to light.  Cardiovascular:     Rate and Rhythm: Normal rate and regular rhythm.     Pulses: Normal pulses.     Heart sounds: Normal heart sounds.     Comments: 2+ bilateral radial/dorsalis pedis pulses with regular rate Pulmonary:     Effort: Pulmonary effort is normal.     Comments: Wheezing noted in bilateral lungs Abdominal:     Palpations: Abdomen is soft.     Tenderness: There is no abdominal tenderness. There is no guarding or rebound.  Musculoskeletal:        General: Normal range of motion.     Cervical back: Normal range of motion and neck supple.     Right lower leg: No edema.     Left lower leg: No edema.     Comments: 5 out of 5 bilateral grip/leg extension strength No signs of fluid overload  Skin:    General: Skin is warm and dry.     Capillary Refill: Capillary refill takes less than 2 seconds.  Neurological:     General: No focal deficit present.     Mental Status: He is alert and oriented to person, place, and time.     Comments: Sensation intact in all 4 limbs  Psychiatric:        Mood and Affect: Mood normal.     ED Results / Procedures / Treatments   Labs (all labs ordered are listed, but only abnormal results are displayed) Labs Reviewed  BASIC METABOLIC PANEL - Abnormal; Notable for the following components:      Result Value   Glucose, Bld 102 (*)    All other components within normal limits  D-DIMER, QUANTITATIVE - Abnormal; Notable for the following components:   D-Dimer, Quant 1.86 (*)    All other components within normal limits  HEPATIC FUNCTION PANEL - Abnormal; Notable for the following components:   Total Protein 6.4 (*)    All other components within normal limits  TROPONIN I (HIGH SENSITIVITY) - Abnormal; Notable for the following components:    Troponin I (High Sensitivity) 20 (*)    All other components within normal limits  TROPONIN I (HIGH SENSITIVITY) - Abnormal; Notable for the following components:   Troponin I (High Sensitivity) 20 (*)    All other components within normal limits  SARS CORONAVIRUS 2 BY RT PCR  CBC  BRAIN NATRIURETIC PEPTIDE    EKG None  Radiology CT Angio Chest PE W/Cm &/Or Wo Cm  Result Date: 06/24/2022 CLINICAL DATA:  Short of breath, elevated D-dimer EXAM: CT ANGIOGRAPHY CHEST  WITH CONTRAST TECHNIQUE: Multidetector CT imaging of the chest was performed using the standard protocol during bolus administration of intravenous contrast. Multiplanar CT image reconstructions and MIPs were obtained to evaluate the vascular anatomy. RADIATION DOSE REDUCTION: This exam was performed according to the departmental dose-optimization program which includes automated exposure control, adjustment of the mA and/or kV according to patient size and/or use of iterative reconstruction technique. CONTRAST:  75mL OMNIPAQUE IOHEXOL 350 MG/ML SOLN COMPARISON:  06/24/2022, 08/03/2021 FINDINGS: Cardiovascular: This is a technically adequate evaluation of the pulmonary vasculature. No filling defects or pulmonary emboli. The heart is unremarkable without pericardial effusion. No evidence of thoracic aortic aneurysm or dissection. Atherosclerosis of the aorta and coronary vasculature. Mediastinum/Nodes: No enlarged mediastinal, hilar, or axillary lymph nodes. Thyroid gland, trachea, and esophagus demonstrate no significant findings. Lungs/Pleura: No acute airspace disease, effusion, or pneumothorax. Central airways are patent. The 3 mm right upper lobe nodule seen on prior study has a more linear configuration on this exam, measuring up to 5 x 10 by 6 mm, reference image 35/6. Coarse calcifications are seen within this nodule. New tree in bud nodularity has developed within the periphery of the left upper lobe, reference images 60-63 of series  6, with nodules measuring up to 4 mm in size. Upper Abdomen: No acute abnormality. Musculoskeletal: No acute or destructive bony lesions. Reconstructed images demonstrate no additional findings. Review of the MIP images confirms the above findings. IMPRESSION: 1. No evidence of pulmonary embolus. 2. Bilateral upper lobe nodularity as above, largest right upper lobe nodule now with a mean diameter of 7 mm. I would favor benign inflammatory or infectious etiology, though repeat CT at 12-18 months (from today's scan) is considered optional for low-risk patients, but is recommended for high-risk patients. This recommendation follows the consensus statement: Guidelines for Management of Incidental Pulmonary Nodules Detected on CT Images: From the Fleischner Society 2017; Radiology 2017; 284:228-243. 3.  Aortic Atherosclerosis (ICD10-I70.0). Electronically Signed   By: Sharlet Salina M.D.   On: 06/24/2022 20:29   DG Chest 2 View  Result Date: 06/24/2022 CLINICAL DATA:  Short of breath. EXAM: CHEST - 2 VIEW COMPARISON:  04/23/2022. FINDINGS: Cardiac silhouette normal in size and configuration. No mediastinal or hilar masses. No evidence of adenopathy. Clear lungs.  No pleural effusion or pneumothorax. Skeletal structures are intact. IMPRESSION: No active cardiopulmonary disease. Electronically Signed   By: Amie Portland M.D.   On: 06/24/2022 16:40    Procedures Procedures    Medications Ordered in ED Medications  albuterol (VENTOLIN HFA) 108 (90 Base) MCG/ACT inhaler 2 puff (has no administration in time range)  ipratropium-albuterol (DUONEB) 0.5-2.5 (3) MG/3ML nebulizer solution 3 mL (3 mLs Nebulization Given 06/24/22 1759)  magnesium sulfate IVPB 2 g 50 mL (0 g Intravenous Stopped 06/24/22 1823)  methylPREDNISolone sodium succinate (SOLU-MEDROL) 125 mg/2 mL injection 125 mg (125 mg Intravenous Given 06/24/22 1800)  diphenhydrAMINE (BENADRYL) injection 12.5 mg (12.5 mg Intravenous Given 06/24/22 1837)   ipratropium-albuterol (DUONEB) 0.5-2.5 (3) MG/3ML nebulizer solution 6 mL (6 mLs Nebulization Given 06/24/22 1837)  iohexol (OMNIPAQUE) 350 MG/ML injection 75 mL (75 mLs Intravenous Contrast Given 06/24/22 2002)    ED Course/ Medical Decision Making/ A&P                             Medical Decision Making Amount and/or Complexity of Data Reviewed Labs: ordered. Radiology: ordered.  Risk Prescription drug management.   Nihal Rhue Pfiester 71  y.o. presented today for shortness of breath.  Working DDx that I considered at this time includes, but not limited to, asthma/COPD exacerbation, URI, viral illness, anemia, ACS, PE, pneumonia, pleural effusion, lung cancer.  R/o DDx: asthma/COPD exacerbation, URI, viral illness, anemia, ACS, PE, pneumonia, pleural effusion, lung cancer: These are considered less likely due to history of present illness and physical exam findings  Review of prior external notes: 06/20/2022 ED  Unique Tests and My Interpretation:  CBC: Unremarkable BMP: Unremarkable EKG: Sinus rhythm 55 bpm, no ST abnormalities or blocks noted Troponin: 20, 20 Hepatic function panel: Unremarkable BNP: Unremarkable D-dimer: Elevated CXR: No acute cardiopulmonary changes CTA Chest PE: 7 mm nodule found no need for repeat CT Respiratory Panel: negative  Discussion with Independent Historian: None  Discussion of Management of Tests: None  Risk: Medium: prescription drug management  Risk Stratification Score:  None  Plan: Patient presented for shortness of breath.  On exam patient was wheezing bilaterally however it was in no acute distress and able speak in full sentences and has stable vitals.  Patient stated that he is currently taking the doxycycline and is 5 days into the 7-day treatment but that symptoms have continued to worsen.  Patient stated he normally gets the symptoms in the springtime however they have been worse this year.  Patient's well score is 0 but due to  patient's age and progression of symptoms on antibiotics a D-dimer was ordered along with a BNP and respiratory panel.  Due to patient's wheezing he received magnesium, Solu-Medrol, and DuoNeb.  Patient stable at this time.  Nurse notified me that patient was having a reaction to the magnesium and stopped the infusion.  On exam patient did have urticaria area noted proximal to the IV site and so magnesium was stopped and Benadryl be given.  Patient was still wheezing on exam so another DuoNeb treatment will be ordered.  Still awaiting labs.  Patient stable this time.  Patient's D-dimer came back elevated.  CTA chest was ordered to evaluate for possible PE causing patient's symptoms.  Patient was updated of this and is currently stable.  Patient CTA chest was negative for blood clot but did show a subcentimeter nodule in his lung.  I do not suspect is causing patient's cough however patient was updated of this and that he will need to repeat outpatient CT scan in 12 months.  Patient's vitals have remained stable throughout ED course and patient has not been requiring oxygen.  On recheck patient's wheezing had improved.  Patient be given albuterol inhaler for wheezing at home along with prednisone and Tessalon for his cough with outpatient follow-up with his primary care provider.  This was verbalized to the patient and patient verbalizes acceptance of this plan.  Patient was given return precautions. Patient stable for discharge at this time.  Patient verbalized understanding of plan.         Final Clinical Impression(s) / ED Diagnoses Final diagnoses:  Acute cough    Rx / DC Orders ED Discharge Orders          Ordered    benzonatate (TESSALON) 100 MG capsule  Every 8 hours,   Status:  Discontinued        06/24/22 2041    predniSONE (DELTASONE) 10 MG tablet  Daily        06/24/22 2041    benzonatate (TESSALON) 100 MG capsule  Every 8 hours        06/24/22 2041  Remi Deter 06/24/22 2044    Lorre Nick, MD 06/25/22 [phone]

## 2022-06-24 NOTE — ED Triage Notes (Signed)
Pt came in via POV d/t difficulty breathing for a few days. Pt went to UC & was given, meds with no relief, returns with worsening SOB & cough. A/Ox4. Has some chest heaviness & pain from coughing.

## 2022-06-24 NOTE — Discharge Instructions (Addendum)
Please pick up the medications I prescribed for you.  The Tessalon is a cough medication that should help with your cough and the prednisone is a steroid that should help with inflammation.  Today your labs and imaging were negative for blood clots or cause for your cough however on the CT scan there is nodule found that will need to be reevaluated in 12 months with an outpatient CT scan.  We are also giving you an albuterol inhaler in the emergency department today that he can use if you begin to feel that you are wheezing or short of breath.  Please make an appoint with the primary care provider to be reevaluated in the next few days as symptoms may change.  If symptoms worsen please return to ER.

## 2022-06-24 NOTE — ED Notes (Signed)
Magnesium infusion stopped early due to patient developing rash in right axilla, right forearm, and at IV site. PA Schuman notified and came to assess patient, instructed RN to discontinue the magnesium infusion.

## 2022-07-12 ENCOUNTER — Emergency Department (HOSPITAL_COMMUNITY): Payer: Medicare Other

## 2022-07-12 ENCOUNTER — Other Ambulatory Visit: Payer: Self-pay

## 2022-07-12 ENCOUNTER — Encounter (HOSPITAL_COMMUNITY): Payer: Self-pay

## 2022-07-12 ENCOUNTER — Emergency Department (HOSPITAL_COMMUNITY): Admission: EM | Admit: 2022-07-12 | Payer: Self-pay | Source: Home / Self Care

## 2022-07-12 ENCOUNTER — Emergency Department (HOSPITAL_COMMUNITY)
Admission: EM | Admit: 2022-07-12 | Discharge: 2022-07-12 | Disposition: A | Discharge status: Home / Self Care | Payer: Medicare Other | Attending: Emergency Medicine | Admitting: Emergency Medicine

## 2022-07-12 DIAGNOSIS — W010XXA Fall on same level from slipping, tripping and stumbling without subsequent striking against object, initial encounter: Secondary | ICD-10-CM | POA: Diagnosis not present

## 2022-07-12 DIAGNOSIS — Z7901 Long term (current) use of anticoagulants: Secondary | ICD-10-CM | POA: Insufficient documentation

## 2022-07-12 DIAGNOSIS — M25512 Pain in left shoulder: Secondary | ICD-10-CM | POA: Insufficient documentation

## 2022-07-12 DIAGNOSIS — Z043 Encounter for examination and observation following other accident: Secondary | ICD-10-CM | POA: Diagnosis not present

## 2022-07-12 DIAGNOSIS — R0789 Other chest pain: Secondary | ICD-10-CM | POA: Diagnosis present

## 2022-07-12 DIAGNOSIS — S2232XA Fracture of one rib, left side, initial encounter for closed fracture: Secondary | ICD-10-CM | POA: Diagnosis not present

## 2022-07-12 DIAGNOSIS — W19XXXA Unspecified fall, initial encounter: Secondary | ICD-10-CM

## 2022-07-12 DIAGNOSIS — J9811 Atelectasis: Secondary | ICD-10-CM | POA: Diagnosis not present

## 2022-07-12 MED ORDER — TRAMADOL HCL 50 MG PO TABS: ORAL_TABLET | ORAL | 0 refills | Status: DC

## 2022-07-12 NOTE — ED Triage Notes (Addendum)
Pt came in via POV d/t falling 5 days ago & landing on his Lt shoulder on concrete & rates his pain 5/10 & also has Lt sided chest wall pain as well. A/Ox4. Not on thinners, unknown if he hit his head, no head pain at this time.

## 2022-07-12 NOTE — ED Provider Notes (Signed)
[place] EMERGENCY DEPARTMENT AT [facility] Provider Note   CSN: [id number] Arrival date & time: 07/12/22  1321     History  Chief Complaint  Patient presents with   Lt shoulder Pain   Lt sided chest wall pain    Jonathan Powers is a 72 y.o. male.  HPI Patient was out walking on a cement paved trail 5 days ago.  He tripped and fell directly onto his left shoulder and side with his arm tucked beside his body.  He reports he is continue to have a lot of pain that seems to be in the shoulder but is radiating to the upper lateral aspect of the chest.  He can move his shoulder in various directions but at a certain point has a lot of pain with raising the arm.  Especially there is radiation of pain that goes just below the underarm to the side of the chest wall.  He denies he is having any cough, shortness of breath, fever or chills.  Patient reports he does have some chronic degenerative disease of the neck does not feel that this is worse than baseline.  No intra-abdominal pain, no vomiting, no lightheadedness.    Home Medications Prior to Admission medications   Medication Sig Start Date End Date Taking? Authorizing Provider  traMADol (ULTRAM) 50 MG tablet 1-2 tablets every 6 hours as needed for pain 07/12/22  Yes Tionne Carelli, Lebron Conners, MD  acetaminophen (TYLENOL) 500 MG tablet Take 1,000 mg by mouth every 6 (six) hours as needed for moderate pain.     [provider]  albuterol (VENTOLIN HFA) 108 (90 Base) MCG/ACT inhaler Inhale 1-2 puffs into the lungs every 6 (six) hours as needed for wheezing or shortness of breath. 06/20/22   Raspet, Noberto Retort, PA-C  benzonatate (TESSALON) 100 MG capsule Take 1 capsule (100 mg total) by mouth every 8 (eight) hours. 06/24/22   Netta Corrigan, PA-C  cetirizine (ZYRTEC) 10 MG tablet Take 10 mg by mouth daily as needed for allergies.    [provider]  doxycycline (VIBRAMYCIN) 100 MG capsule Take 1 capsule (100 mg total) by mouth 2  (two) times daily. 06/20/22   Raspet, Noberto Retort, PA-C  esomeprazole (NEXIUM) 40 MG capsule Take 1 capsule (40 mg total) by mouth daily. Patient taking differently: Take 40 mg by mouth daily as needed (heartburn). 03/19/21   [name], MD  fluticasone (FLONASE) 50 MCG/ACT nasal spray Place 1 spray into both nostrils daily as needed for allergies. 07/06/21   [provider]  ketoconazole (NIZORAL) 2 % cream Apply 1 Application topically daily as needed for irritation (rash). 08/16/21   [provider]  levothyroxine (SYNTHROID) 175 MCG tablet TAKE 1 TABLET BY MOUTH ONCE DAILY BEFORE BREAKFAST 01/28/22   [name], MD  memantine (NAMENDA) 5 MG tablet Take 1 tablet (5 mg at night) for 2 weeks, then increase to 1 tablet (5 mg) twice a day 04/27/22   Marcos Eke, PA-C  olmesartan-hydrochlorothiazide (BENICAR HCT) 40-25 MG tablet TAKE 1 TABLET BY MOUTH ONCE DAILY AS DIRECTED 05/22/22   Yates Decamp, MD  predniSONE (DELTASONE) 10 MG tablet Take 2 tablets (20 mg total) by mouth daily. 06/24/22   Netta Corrigan, PA-C  rosuvastatin (CRESTOR) 5 MG tablet Take 1 tablet (5 mg total) by mouth daily. 11/01/21   [name], MD  tadalafil (CIALIS) 5 MG tablet Take 1 tablet (5 mg total) by mouth daily as needed for  erectile dysfunction. 12/16/21   [name], MD  Testosterone 1.62 % GEL  11/26/20   [provider]  traZODone (DESYREL) 100 MG tablet TAKE 1 TABLET BY MOUTH AT BEDTIME AS NEEDED FOR SLEEP 06/11/22   [name], MD  apixaban (ELIQUIS) 2.5 MG TABS tablet Take 1 tablet (2.5 mg total) by mouth 2 (two) times daily. 03/07/19 05/16/19  Allena Katz, PA-C      Allergies    Nsaids and Tolmetin    Review of Systems   Review of Systems  Physical Exam Updated Vital Signs BP (!) 148/97 (BP Location: Right Arm)   Pulse (!) 58   Temp 98.6 F (37 C)   Resp 16   SpO2 95%  Physical Exam Constitutional:      Comments: Alert nontoxic clinically well in appearance.  No  acute distress.  HENT:     Head: Normocephalic and atraumatic.     Mouth/Throat:     Pharynx: Oropharynx is clear.  Eyes:     Extraocular Movements: Extraocular movements intact.  Neck:     Comments: No significant midline C-spine tenderness to palpation. Cardiovascular:     Rate and Rhythm: Normal rate and regular rhythm.  Pulmonary:     Effort: Pulmonary effort is normal.     Breath sounds: Normal breath sounds.     Comments: Breath sounds are symmetric with good airflow.  Chest wall on the left is very tender to palpation at about the 6 to eighth ribs in the mid axillary line.  No crepitus or visible bruising. Abdominal:     General: There is no distension.     Palpations: Abdomen is soft.     Tenderness: There is no abdominal tenderness. There is no guarding.     Comments: No CVA tenderness and no left upper quadrant tenderness.  Abdomen soft without guarding.  Musculoskeletal:     Comments: No deformity of the left shoulder.  Patient does have intact range of motion with pain that starts at 90 degrees of abduction.  No significant pain to direct pressure over the [facility] joint.  Hand is warm and dry.  Skin:    General: Skin is warm.  Neurological:     General: No focal deficit present.     Mental Status: He is oriented to person, place, and time.     Coordination: Coordination normal.     ED Results / Procedures / Treatments   Labs (all labs ordered are listed, but only abnormal results are displayed) Labs Reviewed - No data to display  EKG None  Radiology DG Shoulder Left  Result Date: 07/12/2022 CLINICAL DATA:  Status post fall. EXAM: LEFT SHOULDER - 2+ VIEW COMPARISON:  None Available. FINDINGS: A thin linear lucency is seen overlying the lateral aspect of the seventh left rib. This is of indeterminate age. The osseous structures of the left shoulder are intact. Moderate severity degenerative changes are seen involving the left acromioclavicular joint and left  glenohumeral articulation. A 5 mm soft tissue calcification is seen adjacent to the greater tubercle of the left humeral head. IMPRESSION: Findings which may represent a nondisplaced seventh left rib fracture of indeterminate age. Correlation with physical examination is recommended to determine the presence of point tenderness. Electronically Signed   By: Aram Candela M.D.   On: 07/12/2022 14:51   DG Chest 2 View  Result Date: 07/12/2022 CLINICAL DATA:  Status post fall on the left side 5 days ago. EXAM: CHEST - 2  VIEW COMPARISON:  Jun 24, 2022 FINDINGS: The heart size and mediastinal contours are within normal limits. Low lung volumes are noted. Mild atelectasis is seen within the right lung base. There is no evidence of a pleural effusion or pneumothorax. Radiopaque surgical clips are seen within the right upper quadrant. Multilevel degenerative changes seen throughout the thoracic spine. IMPRESSION: Low lung volumes with mild right basilar atelectasis. Electronically Signed   By: Aram Candela M.D.   On: 07/12/2022 14:46    Procedures Procedures    Medications Ordered in ED Medications - No data to display  ED Course/ Medical Decision Making/ A&P                             Medical Decision Making  Patient presents with fall 5 days ago with persistent pain in the left shoulder and left chest wall.  Patient is not anticoagulated.  Differential diagnosis includes bony fracture\rotator cuff tear\AC separation\rib fracture\pneumothorax\solid organ injury spleen or kidney.  Chest x-ray and left shoulder x-ray obtained through triage screening.  X-rays reviewed by radiology positive for probable left seventh rib fracture and degenerative changes but no acute fractures of the shoulder.  At this time patient is clinically well in appearance.  His fall was 5 days ago and there is no sign of significant intra-abdominal injury.  No pain to palpation over the kidney or spleen.  He is  clinically well with no signs of hypovolemia.  Physical exam does correlate with possible seventh rib fracture identified on shoulder x-ray by radiology.  At this time I suspect this is the main source of pain.  Chest x-ray does not show pneumothorax or hemothorax.  Patient's respirations are nonlabored and breath sounds are symmetric.  No shortness of breath subjectively or objectively.  At this time I have low suspicion for other significant intrathoracic injury.  Patient is clinically stable for symptomatic treatment.  He has been taking Tylenol will add tramadol for additional pain control have made suggestions for over-the-counter pain patches.  At this time possible soft tissue injury of the shoulder although range of motion is fair without any evidence of bony injury, appropriate for follow-up with orthopedics for further evaluation.  Discharge instructions include return precautions and pain control and follow-up plan.        Final Clinical Impression(s) / ED Diagnoses Final diagnoses:  Closed fracture of one rib of left side, initial encounter  Fall, initial encounter    Rx / DC Orders ED Discharge Orders          Ordered    traMADol (ULTRAM) 50 MG tablet        07/12/22 1543              Arby Barrette, MD 07/12/22 1553

## 2022-07-12 NOTE — Discharge Instructions (Signed)
1.  You may continue to use extra strength Tylenol for pain control.  You may add 1-2 tramadol tablets every 6 hours if additional pain control needed.  You may also opt to use over-the-counter pain patches such as Salonpas. 2.  Continue to take deep breaths and move around.  Failure to do so may increase your risk of getting pneumonia. 3.  Follow-up with your family doctor for recheck in 3 to 5 days.  Return to the emergency department if you are getting worsening pain, shortness of breath, fevers, cough with bloody sputum or other concerning symptoms. 4.  If you are continuing to having pain in the shoulder that is significant, follow-up with your orthopedic specialist for recheck.

## 2022-07-14 ENCOUNTER — Ambulatory Visit (INDEPENDENT_AMBULATORY_CARE_PROVIDER_SITE_OTHER): Payer: Medicare Other | Admitting: Family Medicine

## 2022-07-14 ENCOUNTER — Encounter: Payer: Self-pay | Admitting: Family Medicine

## 2022-07-14 VITALS — BP 140/72 | HR 59 | Temp 97.8°F | Ht 71.0 in | Wt 252.0 lb

## 2022-07-14 DIAGNOSIS — S2232XA Fracture of one rib, left side, initial encounter for closed fracture: Secondary | ICD-10-CM | POA: Diagnosis not present

## 2022-07-14 DIAGNOSIS — W19XXXA Unspecified fall, initial encounter: Secondary | ICD-10-CM | POA: Diagnosis not present

## 2022-07-14 NOTE — Progress Notes (Signed)
Subjective:     Patient ID: Jonathan Powers, male    DOB: 1950-05-30, 72 y.o.   MRN: [id number]  Chief Complaint  Patient presents with   Fall    Chest a little swollen, SOB a little with left sided pain since falling on left side. Fell 3-4 days ago and went to ED    Fall Pertinent negatives include no abdominal pain, fever, headaches, nausea or vomiting.   Patient is in today for left sided chest wall pain with movement and breathing. He fell on his left side several days ago, tripped while hiking a trail.   Evaluated in the ED 2 days ago. X ray showed fracture of rib on left.  He was not aware of this or that he had pain medication sent to the pharmacy.   He also has some pain in his left shoulder since the fall.  Denies hitting his head. No LOC.   Denies any worsening neck or back pain.   Health Maintenance Due  Topic Date Due   Medicare Annual Wellness (AWV)  Never done   Zoster Vaccines- Shingrix (1 of 2) Never done   Pneumonia Vaccine 61+ Years old (3 of 3 - PPSV23 or PCV20) 02/19/2022    Past Medical History:  Diagnosis Date   Age-related osteoporosis with current pathological fracture 03/11/2018   Allergic rhinitis 05/17/2006   Amnestic MCI (mild cognitive impairment with memory loss) 10/08/2020   BPH (benign prostatic hyperplasia) 09/06/2012   Cellulitis, right leg    Recurrent R hip cellulitis 1/08,3/08    Chronic fatigue, unspecified    Chronic hyperglycemia 06/29/2020   Chronic right-sided low back pain without sciatica 07/21/2016   Chronic sinusitis 07/10/2021   Diverticulitis    Essential hypertension 05/17/2006   2014     Impression  Exercise Capacity:  Lexiscan with low level exercise.  BP Response:  Normal blood pressure response.  Clinical Symptoms:  No significant symptoms noted.  ECG Impression:  No significant ST segment change suggestive of ischemia.  Comparison with Prior Nuclear Study: No images to compare     Overall Impression:  Low risk  stress nuclear study There is mild apical thinning but no    Exocrine pancreatic insufficiency 12/03/2021   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   Flexural eczema 07/24/2017   GERD (gastroesophageal reflux disease)    Gout    Hashimoto's thyroiditis    History of skin cancer    Melanoma on back   Hypogonadism male    low T, a/w ED   Hypothyroidism 11/11/2018   Insomnia 11/10/2018   Left thyroid nodule 2008   on [place], decrease size in 2011 [place]   Lung nodule 07/25/2021   Migraines    Atypical and ocular   Obesity (BMI 30-39.9) 05/17/2006   OSA (obstructive sleep apnea) 01/27/2013   HST 01/2013:  AHI 68/hr with obstructive and central events.   09/2015 compliance report> > 4 hours 80% of days, used 25/30 days   Primary osteoarthritis 05/17/2006   Reactive thrombocytosis 09/17/2021   Recurrent genital HSV (herpes simplex virus) infection 11/12/2018   Superior mesenteric artery aneurysm 07/12/2015   DEC 2019     IMPRESSION:  VASCULAR     1. No acute findings.  2. 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  3. Ectatic abdominal aorta at risk for aneurysm development.  Recommend followup by ultrasound in 5 years. This recommendation  follows ACR consensus guidelines: White Paper of the ACR Incidental  Findings  Committee II on Vascular Findings. J Am Coll Radiol 2013;  [phone].  4. 1.   Symptomatic PVCs 09/06/2012    Past Surgical History:  Procedure Laterality Date   CARPAL TUNNEL RELEASE Left 03/30/2017   Procedure: CARPAL TUNNEL RELEASE;  Surgeon: Valeria Batman, MD;  Location: [facility];  Service: Orthopedics;  Laterality: Left;   CHOLECYSTECTOMY  2004   CTR Bilateral 1982   CYSTOSCOPY  1974   FOOT FUSION Left 06/22/2017   HEMORRHOID SURGERY N/A 03/30/2017   Procedure: HEMORRHOIDECTOMY;  Surgeon: Abigail Miyamoto, MD;  Location: [facility];  Service: General;  Laterality: N/A;   open reduction L little finger Left 1970   Repair digital  thumb, left Left 1990   Right shoulder cuff repair Right 09/15/11   Right shoulder SAD, DCR Right 02/05/11   TOTAL HIP ARTHROPLASTY Left 07/26/06   TOTAL HIP ARTHROPLASTY Right 1999   TOTAL HIP REVISION Left 03/07/2019   Procedure: LEFT TOTAL HIP REVISION POSTERIOR APPROACH;  Surgeon: Gean Birchwood, MD;  Location: [facility];  Service: Orthopedics;  Laterality: Left;    Family History  Problem Relation Age of Onset   Arthritis Father    Heart disease Father    Diabetes Father    Vascular Disease Father        AoBifem   Arthritis Mother    Diabetes Mother    Multiple sclerosis Mother    Cancer Paternal Grandmother        "GI"   Cancer Paternal Grandfather        stomach cancer   Thyroid cancer Sister    Uterine cancer Sister     Social History   Socioeconomic History   Marital status: Married    Spouse name: Not on file   Number of children: 3   Years of education: Not on file   Highest education level: Not on file  Occupational History   Occupation: Orthopedic PA    Employer: [employer]  Tobacco Use   Smoking status: Never   Smokeless tobacco: Never  Vaping Use   Vaping Use: Never used  Substance and Sexual Activity   Alcohol use: Yes    Alcohol/week: 0.0 standard drinks of alcohol    Comment: rare   Drug use: No   Sexual activity: Yes  Other Topics Concern   Not on file  Social History Narrative   ortho PA, married, lives with spouse   Right handed   Two story home   Social Determinants of Health   Financial Resource Strain: Not on file  Food Insecurity: No Food Insecurity (09/14/2021)   Hunger Vital Sign    Worried About Running Out of Food in the Last Year: Never true    Ran Out of Food in the Last Year: Never true  Transportation Needs: No Transportation Needs (09/14/2021)   PRAPARE - Administrator, Civil Service (Medical): No    Lack of Transportation (Non-Medical): No  Physical Activity: Not on file  Stress: Not on file  Social  Connections: Not on file  Intimate Partner Violence: Not on file    Outpatient Medications Prior to Visit  Medication Sig Dispense Refill   acetaminophen (TYLENOL) 500 MG tablet Take 1,000 mg by mouth every 6 (six) hours as needed for moderate pain.      albuterol (VENTOLIN HFA) 108 (90 Base) MCG/ACT inhaler Inhale 1-2 puffs into the lungs every 6 (six) hours as needed for wheezing or shortness of breath. 18  g 0   cetirizine (ZYRTEC) 10 MG tablet Take 10 mg by mouth daily as needed for allergies.     esomeprazole (NEXIUM) 40 MG capsule Take 1 capsule (40 mg total) by mouth daily. (Patient taking differently: Take 40 mg by mouth daily as needed (heartburn).) 90 capsule 1   fluticasone (FLONASE) 50 MCG/ACT nasal spray Place 1 spray into both nostrils daily as needed for allergies.     ketoconazole (NIZORAL) 2 % cream Apply 1 Application topically daily as needed for irritation (rash).     levothyroxine (SYNTHROID) 175 MCG tablet TAKE 1 TABLET BY MOUTH ONCE DAILY BEFORE BREAKFAST 90 tablet 0   memantine (NAMENDA) 5 MG tablet Take 1 tablet (5 mg at night) for 2 weeks, then increase to 1 tablet (5 mg) twice a day 60 tablet 11   olmesartan-hydrochlorothiazide (BENICAR HCT) 40-25 MG tablet TAKE 1 TABLET BY MOUTH ONCE DAILY AS DIRECTED 90 tablet 0   rosuvastatin (CRESTOR) 5 MG tablet Take 1 tablet (5 mg total) by mouth daily. 90 tablet 1   tadalafil (CIALIS) 5 MG tablet Take 1 tablet (5 mg total) by mouth daily as needed for erectile dysfunction. 10 tablet 2   Testosterone 1.62 % GEL      traZODone (DESYREL) 100 MG tablet TAKE 1 TABLET BY MOUTH AT BEDTIME AS NEEDED FOR SLEEP 90 tablet 0   traMADol (ULTRAM) 50 MG tablet 1-2 tablets every 6 hours as needed for pain (Patient not taking: Reported on 07/14/2022) 15 tablet 0   benzonatate (TESSALON) 100 MG capsule Take 1 capsule (100 mg total) by mouth every 8 (eight) hours. (Patient not taking: Reported on 07/14/2022) 63 capsule 0   doxycycline (VIBRAMYCIN)  100 MG capsule Take 1 capsule (100 mg total) by mouth 2 (two) times daily. (Patient not taking: Reported on 07/14/2022) 14 capsule 0   predniSONE (DELTASONE) 10 MG tablet Take 2 tablets (20 mg total) by mouth daily. (Patient not taking: Reported on 07/14/2022) 15 tablet 0   No facility-administered medications prior to visit.    Allergies  Allergen Reactions   Nsaids Shortness Of Breath    Naproxen specifically causes SOB/wheeze tolerates topical diclofenac without problem Tylenol OK   Tolmetin Shortness Of Breath    Naproxen specifically causes SOB/wheeze tolerates topical diclofenac without problem Tylenol OK    Review of Systems  Constitutional:  Negative for chills and fever.  Respiratory:  Positive for shortness of breath.   Cardiovascular:  Positive for chest pain. Negative for palpitations.  Gastrointestinal:  Negative for abdominal pain, constipation, diarrhea, nausea and vomiting.  Genitourinary:  Negative for dysuria, frequency and urgency.  Musculoskeletal:  Positive for falls. Negative for back pain and neck pain.  Neurological:  Negative for dizziness, focal weakness and headaches.  Psychiatric/Behavioral:  Positive for memory loss.        Objective:    Physical Exam Constitutional:      General: He is not in acute distress.    Appearance: He is not ill-appearing.  HENT:     Head: Atraumatic.  Eyes:     Extraocular Movements: Extraocular movements intact.     Conjunctiva/sclera: Conjunctivae normal.  Cardiovascular:     Rate and Rhythm: Normal rate and regular rhythm.  Pulmonary:     Effort: Pulmonary effort is normal.     Breath sounds: Normal breath sounds.  Chest:     Chest wall: Tenderness present. No crepitus.       Comments: Chest wall TTP Musculoskeletal:  Cervical back: Normal range of motion and neck supple.  Skin:    General: Skin is warm and dry.  Neurological:     General: No focal deficit present.     Mental Status: He is alert and  oriented to person, place, and time.  Psychiatric:        Mood and Affect: Mood normal.        Behavior: Behavior normal.        Thought Content: Thought content normal.     BP (!) 140/72 (BP Location: Left Arm, Patient Position: Sitting, Cuff Size: Large)   Pulse (!) 59   Temp 97.8 F (36.6 C) (Temporal)   Ht 5\' 11"  (1.803 m)   Wt 252 lb (114.3 kg)   SpO2 98%   BMI 35.15 kg/m  Wt Readings from Last 3 Encounters:  07/14/22 252 lb (114.3 kg)  06/24/22 247 lb (112 kg)  05/01/22 243 lb (110.2 kg)       Assessment & Plan:   Problem List Items Addressed This Visit   None Visit Diagnoses     Closed fracture of one rib of left side, initial encounter    -  Primary   Fall, initial encounter          No acute distress.  Retired ortho PA.  Reviewed ED notes stating patient has a closed rib fracture. I also advised him that he has Tramadol at his pharmacy to take prn pain.  Discussed conservative treatment and that rib fractures can take weeks to heal.  Follow up if any new or worsening symptoms.   I have discontinued Jerick Filak. Farner's doxycycline, predniSONE, and benzonatate. I am also having him maintain his cetirizine, acetaminophen, esomeprazole, fluticasone, ketoconazole, rosuvastatin, tadalafil, levothyroxine, memantine, Testosterone, olmesartan-hydrochlorothiazide, traZODone, albuterol, and traMADol.  No orders of the defined types were placed in this encounter.

## 2022-07-14 NOTE — Patient Instructions (Signed)
Let us know if the pain medication is not at your pharmacy.   Follow up as needed.   Rib Fracture  A rib fracture is a break or crack in one of the bones of the ribs. The ribs are like a cage that goes around your upper chest. A broken or cracked rib is often painful, but most do not cause other problems. Most rib fractures usually heal on their own in 1-3 months. What are the causes? Doing movements over and over again with a lot of force, such as pitching a baseball or having a very bad cough. A direct hit to the chest. Cancer that has spread to the bones. What are the signs or symptoms? Pain when you breathe in or cough. Pain when someone presses on the injured area. Feeling short of breath. How is this treated? Treatment depends on how bad the fracture is. In general: Most rib fractures usually heal on their own in 1-3 months. Healing may take longer if you have a cough or are doing activities that make the injury worse. While you heal, you may be given medicines to control pain. You will also be taught deep breathing exercises. Very bad injuries may require a stay at the hospital or surgery. Follow these instructions at home: Managing pain, stiffness, and swelling If told, put ice on the injured area. To do this: Put ice in a plastic bag. Place a towel between your skin and the bag. Leave the ice on for 20 minutes, 2-3 times a day. Take off the ice if your skin turns bright red. This is very important. If you cannot feel pain, heat, or cold, you have a greater risk of damage to the area. Take over-the-counter and prescription medicines only as told by your doctor. Activity Avoid activities that cause pain to the injured area. Protect your injured area. Slowly increase activity as told by your doctor. General instructions Do deep breathing exercises as told by your doctor. You may be told to: Take deep breaths many times a day. Cough several times a day while hugging a  pillow. Use a device (incentive spirometer) to do deep breathing many times a day. Drink enough fluid to keep your pee (urine) clear or pale yellow. Do not wear a rib belt or binder. Keep all follow-up visits. Contact a doctor if: You have a fever. Get help right away if: You have trouble breathing. You are short of breath. You cannot stop coughing. You cough up thick or bloody spit. You feel like you may vomit (nauseous), vomit, or have belly (abdominal) pain. Your pain gets worse and medicine does not help. These symptoms may be an emergency. Get help right away. Call your local emergency services (911 in the [place] Do not wait to see if the symptoms will go away. Do not drive yourself to the hospital. Summary A rib fracture is a break or crack in one of the bones of the ribs. Apply ice to the injured area and take medicines for pain as told by your doctor. Take deep breaths and cough several times a day. Hug a pillow every time you cough. This information is not intended to replace advice given to you by your health care provider. Make sure you discuss any questions you have with your health care provider. Document Revised: 05/30/2019 Document Reviewed: 05/30/2019 Elsevier Patient Education  2024 [employer].

## 2022-07-20 ENCOUNTER — Encounter: Payer: Self-pay | Admitting: Internal Medicine

## 2022-07-20 ENCOUNTER — Ambulatory Visit (INDEPENDENT_AMBULATORY_CARE_PROVIDER_SITE_OTHER): Payer: Medicare Other | Admitting: Internal Medicine

## 2022-07-20 VITALS — BP 160/78 | HR 60 | Temp 99.4°F | Ht 71.0 in | Wt 250.0 lb

## 2022-07-20 DIAGNOSIS — R21 Rash and other nonspecific skin eruption: Secondary | ICD-10-CM | POA: Diagnosis not present

## 2022-07-20 DIAGNOSIS — S2232XD Fracture of one rib, left side, subsequent encounter for fracture with routine healing: Secondary | ICD-10-CM

## 2022-07-20 DIAGNOSIS — I10 Essential (primary) hypertension: Secondary | ICD-10-CM | POA: Diagnosis not present

## 2022-07-20 MED ORDER — FLUCONAZOLE 100 MG PO TABS: 100.0000 mg | ORAL_TABLET | Freq: Every day | ORAL | 0 refills | Status: DC

## 2022-07-20 NOTE — Progress Notes (Signed)
Patient ID: MCADOO LIVERPOOL, male   DOB: Dec 07, 1950, 72 y.o.   MRN: [id number]        Chief Complaint: follow up HTN, rash, left rib fx       HPI:  Jonathan Powers is a 72 y.o. male here with c/o worsening red rash to the bilateral groin areas with mild discomfort, itching, seems to be spreading, but no swelling, drainage,ulcer, fever chills.  Also had fall witih left rib fx recently, pain persists but tolerable.  BP has been controlled at home - does not want med change today.  Pt denies increased sob or doe, wheezing, orthopnea, PND, increased LE swelling, palpitations, dizziness or syncope.   Pt denies polydipsia, polyuria, or new focal neuro s/s.    Pt denies wt loss, night sweats, loss of appetite, or other constitutional symptoms          Wt Readings from Last 3 Encounters:  07/20/22 250 lb (113.4 kg)  07/14/22 252 lb (114.3 kg)  06/24/22 247 lb (112 kg)   BP Readings from Last 3 Encounters:  07/20/22 (!) 160/78  07/14/22 (!) 140/72  07/12/22 (!) 148/97         Past Medical History:  Diagnosis Date   Age-related osteoporosis with current pathological fracture 03/11/2018   Allergic rhinitis 05/17/2006   Amnestic MCI (mild cognitive impairment with memory loss) 10/08/2020   BPH (benign prostatic hyperplasia) 09/06/2012   Cellulitis, right leg    Recurrent R hip cellulitis 1/08,3/08    Chronic fatigue, unspecified    Chronic hyperglycemia 06/29/2020   Chronic right-sided low back pain without sciatica 07/21/2016   Chronic sinusitis 07/10/2021   Diverticulitis    Essential hypertension 05/17/2006   2014     Impression  Exercise Capacity:  Lexiscan with low level exercise.  BP Response:  Normal blood pressure response.  Clinical Symptoms:  No significant symptoms noted.  ECG Impression:  No significant ST segment change suggestive of ischemia.  Comparison with Prior Nuclear Study: No images to compare     Overall Impression:  Low risk stress nuclear study There is mild apical  thinning but no    Exocrine pancreatic insufficiency 12/03/2021   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   Flexural eczema 07/24/2017   GERD (gastroesophageal reflux disease)    Gout    Hashimoto's thyroiditis    History of skin cancer    Melanoma on back   Hypogonadism male    low T, a/w ED   Hypothyroidism 11/11/2018   Insomnia 11/10/2018   Left thyroid nodule 2008   on [place], decrease size in 2011 [place]   Lung nodule 07/25/2021   Migraines    Atypical and ocular   Obesity (BMI 30-39.9) 05/17/2006   OSA (obstructive sleep apnea) 01/27/2013   HST 01/2013:  AHI 68/hr with obstructive and central events.   09/2015 compliance report> > 4 hours 80% of days, used 25/30 days   Primary osteoarthritis 05/17/2006   Reactive thrombocytosis 09/17/2021   Recurrent genital HSV (herpes simplex virus) infection 11/12/2018   Superior mesenteric artery aneurysm 07/12/2015   DEC 2019     IMPRESSION:  VASCULAR     1. No acute findings.  2. 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  3. Ectatic abdominal aorta at risk for aneurysm development.  Recommend followup by ultrasound in 5 years. This recommendation  follows ACR consensus guidelines: White Paper of the ACR Incidental  Findings Committee II on Vascular Findings. J Am Coll Radiol 2013;  40:981-191.  4. 1.   Symptomatic PVCs 09/06/2012   Past Surgical History:  Procedure Laterality Date   CARPAL TUNNEL RELEASE Left 03/30/2017   Procedure: CARPAL TUNNEL RELEASE;  Surgeon: Valeria Batman, MD;  Location: [facility];  Service: Orthopedics;  Laterality: Left;   CHOLECYSTECTOMY  2004   CTR Bilateral 1982   CYSTOSCOPY  1974   FOOT FUSION Left 06/22/2017   HEMORRHOID SURGERY N/A 03/30/2017   Procedure: HEMORRHOIDECTOMY;  Surgeon: Abigail Miyamoto, MD;  Location: [facility];  Service: General;  Laterality: N/A;   open reduction L little finger Left 1970   Repair digital thumb, left Left 1990   Right shoulder cuff  repair Right 09/15/11   Right shoulder SAD, DCR Right 02/05/11   TOTAL HIP ARTHROPLASTY Left 07/26/06   TOTAL HIP ARTHROPLASTY Right 1999   TOTAL HIP REVISION Left 03/07/2019   Procedure: LEFT TOTAL HIP REVISION POSTERIOR APPROACH;  Surgeon: Gean Birchwood, MD;  Location: [facility];  Service: Orthopedics;  Laterality: Left;    reports that he has never smoked. He has never used smokeless tobacco. He reports current alcohol use. He reports that he does not use drugs. family history includes Arthritis in his father and mother; Cancer in his paternal grandfather and paternal grandmother; Diabetes in his father and mother; Heart disease in his father; Multiple sclerosis in his mother; Thyroid cancer in his sister; Uterine cancer in his sister; Vascular Disease in his father. Allergies  Allergen Reactions   Nsaids Shortness Of Breath    Naproxen specifically causes SOB/wheeze tolerates topical diclofenac without problem Tylenol OK   Tolmetin Shortness Of Breath    Naproxen specifically causes SOB/wheeze tolerates topical diclofenac without problem Tylenol OK   Current Outpatient Medications on File Prior to Visit  Medication Sig Dispense Refill   acetaminophen (TYLENOL) 500 MG tablet Take 1,000 mg by mouth every 6 (six) hours as needed for moderate pain.      albuterol (VENTOLIN HFA) 108 (90 Base) MCG/ACT inhaler Inhale 1-2 puffs into the lungs every 6 (six) hours as needed for wheezing or shortness of breath. 18 g 0   cetirizine (ZYRTEC) 10 MG tablet Take 10 mg by mouth daily as needed for allergies.     esomeprazole (NEXIUM) 40 MG capsule Take 1 capsule (40 mg total) by mouth daily. (Patient taking differently: Take 40 mg by mouth daily as needed (heartburn).) 90 capsule 1   fluticasone (FLONASE) 50 MCG/ACT nasal spray Place 1 spray into both nostrils daily as needed for allergies.     ketoconazole (NIZORAL) 2 % cream Apply 1 Application topically daily as needed for irritation (rash).      levothyroxine (SYNTHROID) 175 MCG tablet TAKE 1 TABLET BY MOUTH ONCE DAILY BEFORE BREAKFAST 90 tablet 0   memantine (NAMENDA) 5 MG tablet Take 1 tablet (5 mg at night) for 2 weeks, then increase to 1 tablet (5 mg) twice a day 60 tablet 11   olmesartan-hydrochlorothiazide (BENICAR HCT) 40-25 MG tablet TAKE 1 TABLET BY MOUTH ONCE DAILY AS DIRECTED 90 tablet 0   rosuvastatin (CRESTOR) 5 MG tablet Take 1 tablet (5 mg total) by mouth daily. 90 tablet 1   tadalafil (CIALIS) 5 MG tablet Take 1 tablet (5 mg total) by mouth daily as needed for erectile dysfunction. 10 tablet 2   Testosterone 1.62 % GEL      traMADol (ULTRAM) 50 MG tablet 1-2 tablets every 6 hours as needed for pain 15 tablet 0   traZODone (  DESYREL) 100 MG tablet TAKE 1 TABLET BY MOUTH AT BEDTIME AS NEEDED FOR SLEEP 90 tablet 0   [DISCONTINUED] apixaban (ELIQUIS) 2.5 MG TABS tablet Take 1 tablet (2.5 mg total) by mouth 2 (two) times daily. 60 tablet 0   No current facility-administered medications on file prior to visit.        ROS:  All others reviewed and negative.  Objective        PE:  BP (!) 160/78 (BP Location: Left Arm, Patient Position: Sitting, Cuff Size: Normal)   Pulse 60   Temp 99.4 F (37.4 C) (Oral)   Ht 5\' 11"  (1.803 m)   Wt 250 lb (113.4 kg)   SpO2 95%   BMI 34.87 kg/m                 Constitutional: Pt appears in NAD               HENT: Head: NCAT.                Right Ear: External ear normal.                 Left Ear: External ear normal.                Eyes: . Pupils are equal, round, and reactive to light. Conjunctivae and EOM are normal               Nose: without d/c or deformity               Neck: Neck supple. Gross normal ROM               Cardiovascular: Normal rate and regular rhythm.                 Pulmonary/Chest: Effort normal and breath sounds without rales or wheezing.                Abd:  Soft, NT, ND, + BS, no organomegaly               Neurological: Pt is alert. At baseline orientation,  motor grossly intact               Skin: Skin is warm. Intertrigo type rashes to bialteral groin large aras , no other new lesions, LE edema - none               Psychiatric: Pt behavior is normal without agitation   Micro: none  Cardiac tracings I have personally interpreted today:  none  Pertinent Radiological findings (summarize): none   Lab Results  Component Value Date   WBC 6.5 06/24/2022   HGB 15.2 06/24/2022   HCT 45.7 06/24/2022   PLT 308 06/24/2022   GLUCOSE 102 (H) 06/24/2022   CHOL 133 02/09/2021   TRIG 145.0 02/09/2021   HDL 42.50 02/09/2021   LDLCALC 62 02/09/2021   ALT 18 06/24/2022   AST 23 06/24/2022   NA 139 06/24/2022   K 4.1 06/24/2022   CL 101 06/24/2022   CREATININE 0.95 06/24/2022   BUN 11 06/24/2022   CO2 27 06/24/2022   TSH 1.48 11/08/2021   PSA 0.880 08/30/2020   INR 1.0 03/05/2019   HGBA1C 5.3 09/08/2021   Assessment/Plan:  Jonathan Powers is a 72 y.o. White or Caucasian [1] Unavailable [8] male with  has a past medical history of Age-related osteoporosis with current pathological fracture (03/11/2018), Allergic rhinitis (05/17/2006), Amnestic MCI (mild cognitive impairment  with memory loss) (10/08/2020), BPH (benign prostatic hyperplasia) (09/06/2012), Cellulitis, right leg, Chronic fatigue, unspecified, Chronic hyperglycemia (06/29/2020), Chronic right-sided low back pain without sciatica (07/21/2016), Chronic sinusitis (07/10/2021), Diverticulitis, Essential hypertension (05/17/2006), Exocrine pancreatic insufficiency (12/03/2021), Failed total hip arthroplasty (03/06/2019), Flexural eczema (07/24/2017), GERD (gastroesophageal reflux disease), Gout, Hashimoto's thyroiditis, History of skin cancer, Hypogonadism male, Hypothyroidism (11/11/2018), Insomnia (11/10/2018), Left thyroid nodule (2008), Lung nodule (07/25/2021), Migraines, Obesity (BMI 30-39.9) (05/17/2006), OSA (obstructive sleep apnea) (01/27/2013), Primary osteoarthritis (05/17/2006),  Reactive thrombocytosis (09/17/2021), Recurrent genital HSV (herpes simplex virus) infection (11/12/2018), Superior mesenteric artery aneurysm (07/12/2015), and Symptomatic PVCs (09/06/2012).  Rash mod, for diflucan 100 qd x 7 days,  to f/u any worsening symptoms or concerns   Essential hypertension BP Readings from Last 3 Encounters:  07/20/22 (!) 160/78  07/14/22 (!) 140/72  07/12/22 (!) 148/97   Uncontrolled recently, reactive per pt, declines need for any change in tx,, pt to continue medical treatment benicar hct 40-25 qd   Rib fracture With persistent mild to mod pain, declines need for improved pain control, o/w asympt without wrosening sob,  to f/u any worsening symptoms or concerns  Followup: Return if symptoms worsen or fail to improve.  Oliver Barre, MD 07/22/2022 7:46 AM [facility] - [facility]

## 2022-07-20 NOTE — Patient Instructions (Signed)
Please take all new medication as prescribed - the diflucan for the rash  Please continue all other medications as before, and refills have been done if requested.  Please have the pharmacy call with any other refills you may need.  Please keep your appointments with your specialists as you may have planned

## 2022-07-22 ENCOUNTER — Encounter: Payer: Self-pay | Admitting: Internal Medicine

## 2022-07-22 DIAGNOSIS — R21 Rash and other nonspecific skin eruption: Secondary | ICD-10-CM | POA: Insufficient documentation

## 2022-07-22 DIAGNOSIS — S2239XA Fracture of one rib, unspecified side, initial encounter for closed fracture: Secondary | ICD-10-CM

## 2022-07-22 HISTORY — DX: Fracture of one rib, unspecified side, initial encounter for closed fracture: S22.39XA

## 2022-07-22 HISTORY — DX: Rash and other nonspecific skin eruption: R21

## 2022-07-22 NOTE — Assessment & Plan Note (Signed)
With persistent mild to mod pain, declines need for improved pain control, o/w asympt without wrosening sob,  to f/u any worsening symptoms or concerns

## 2022-07-22 NOTE — Assessment & Plan Note (Signed)
mod, for diflucan 100 qd x 7 days,  to f/u any worsening symptoms or concerns

## 2022-07-22 NOTE — Assessment & Plan Note (Signed)
BP Readings from Last 3 Encounters:  07/20/22 (!) 160/78  07/14/22 (!) 140/72  07/12/22 (!) 148/97   Uncontrolled recently, reactive per pt, declines need for any change in tx,, pt to continue medical treatment benicar hct 40-25 qd

## 2022-07-31 DIAGNOSIS — M19031 Primary osteoarthritis, right wrist: Secondary | ICD-10-CM | POA: Diagnosis not present

## 2022-08-02 ENCOUNTER — Encounter: Payer: Self-pay | Admitting: Internal Medicine

## 2022-08-02 ENCOUNTER — Ambulatory Visit (INDEPENDENT_AMBULATORY_CARE_PROVIDER_SITE_OTHER): Payer: Medicare Other | Admitting: Internal Medicine

## 2022-08-02 VITALS — BP 138/84 | HR 64 | Temp 98.2°F | Ht 71.0 in | Wt 254.0 lb

## 2022-08-02 DIAGNOSIS — R109 Unspecified abdominal pain: Secondary | ICD-10-CM

## 2022-08-02 DIAGNOSIS — R10827 Generalized rebound abdominal tenderness: Secondary | ICD-10-CM

## 2022-08-02 DIAGNOSIS — K8681 Exocrine pancreatic insufficiency: Secondary | ICD-10-CM | POA: Diagnosis not present

## 2022-08-02 DIAGNOSIS — N400 Enlarged prostate without lower urinary tract symptoms: Secondary | ICD-10-CM

## 2022-08-02 DIAGNOSIS — E039 Hypothyroidism, unspecified: Secondary | ICD-10-CM

## 2022-08-02 DIAGNOSIS — I10 Essential (primary) hypertension: Secondary | ICD-10-CM | POA: Diagnosis not present

## 2022-08-02 HISTORY — DX: Unspecified abdominal pain: R10.9

## 2022-08-02 HISTORY — DX: Generalized rebound abdominal tenderness: R10.827

## 2022-08-02 LAB — URINALYSIS, ROUTINE W REFLEX MICROSCOPIC
Bilirubin Urine: NEGATIVE
Hgb urine dipstick: NEGATIVE
Ketones, ur: NEGATIVE
Leukocytes,Ua: NEGATIVE
Nitrite: NEGATIVE
RBC / HPF: NONE SEEN (ref 0–?)
Specific Gravity, Urine: >=1.03 — AB (ref 1.000–1.030)
Total Protein, Urine: NEGATIVE
Urine Glucose: NEGATIVE
Urobilinogen, UA: 0.2 (ref 0.0–1.0)
pH: 6 (ref 5.0–8.0)

## 2022-08-02 LAB — LIPID PANEL
Cholesterol: 174 mg/dL (ref 0–200)
HDL: 48.2 mg/dL (ref >39.00)
LDL Cholesterol: 90 mg/dL (ref 0–99)
NonHDL: 125.3
Total CHOL/HDL Ratio: 4
Triglycerides: 177 mg/dL — ABNORMAL HIGH (ref 0.0–149.0)
VLDL: 35.4 mg/dL (ref 0.0–40.0)

## 2022-08-02 LAB — TSH: TSH: 4.74 u[IU]/mL (ref 0.35–5.50)

## 2022-08-02 LAB — CBC WITH DIFFERENTIAL/PLATELET
Basophils Absolute: 0 10*3/uL (ref 0.0–0.1)
Basophils Relative: 0.4 % (ref 0.0–3.0)
Eosinophils Absolute: 0 10*3/uL (ref 0.0–0.7)
Eosinophils Relative: 0.2 % (ref 0.0–5.0)
HCT: 45.3 % (ref 39.0–52.0)
Hemoglobin: 15 g/dL (ref 13.0–17.0)
Lymphocytes Relative: 19.4 % (ref 12.0–46.0)
Lymphs Abs: 1.7 10*3/uL (ref 0.7–4.0)
MCHC: 33 g/dL (ref 30.0–36.0)
MCV: 89.3 fl (ref 78.0–100.0)
Monocytes Absolute: 1.1 10*3/uL — ABNORMAL HIGH (ref 0.1–1.0)
Monocytes Relative: 12.7 % — ABNORMAL HIGH (ref 3.0–12.0)
Neutro Abs: 5.9 10*3/uL (ref 1.4–7.7)
Neutrophils Relative %: 67.3 % (ref 43.0–77.0)
Platelets: 310 10*3/uL (ref 150.0–400.0)
RBC: 5.07 Mil/uL (ref 4.22–5.81)
RDW: 14.1 % (ref 11.5–15.5)
WBC: 8.8 10*3/uL (ref 4.0–10.5)

## 2022-08-02 LAB — PSA: PSA: 1.48 ng/mL (ref 0.10–4.00)

## 2022-08-02 LAB — LIPASE: Lipase: 58 U/L (ref 11.0–59.0)

## 2022-08-02 LAB — AMYLASE: Amylase: 47 U/L (ref 27–131)

## 2022-08-02 MED ORDER — ZENPEP 25000-79000 UNITS PO CPEP
1.0000 | ORAL_CAPSULE | Freq: Three times a day (TID) | ORAL | 1 refills | Status: DC
Start: 2022-08-02 — End: 2022-12-06

## 2022-08-02 NOTE — Patient Instructions (Signed)
Abdominal Pain, Adult  Pain in the abdomen (abdominal pain) can be caused by many things. In most cases, it gets better with no treatment or by being treated at home. But in some cases, it can be serious. Your health care provider will ask questions about your medical history and do a physical exam to try to figure out what is causing your pain. Follow these instructions at home: Medicines Take over-the-counter and prescription medicines only as told by your provider. Do not take medicines that help you poop (laxatives) unless told by your provider. General instructions Watch your condition for any changes. Drink enough fluid to keep your pee (urine) pale yellow. Contact a health care provider if: Your pain changes, gets worse, or lasts longer than expected. You have severe cramping or bloating in your abdomen, or you vomit. Your pain gets worse with meals, after eating, or with certain foods. You are constipated or have diarrhea for more than 2-3 days. You are not hungry, or you lose weight without trying. You have signs of dehydration. These may include: Dark pee, very little pee, or no pee. Cracked lips or dry mouth. Sleepiness or weakness. You have pain when you pee (urinate) or poop. Your abdominal pain wakes you up at night. You have blood in your pee. You have a fever. Get help right away if: You cannot stop vomiting. Your pain is only in one part of the abdomen. Pain on the right side could be caused by appendicitis. You have bloody or black poop (stool), or poop that looks like tar. You have trouble breathing. You have chest pain. These symptoms may be an emergency. Get help right away. Call 911. Do not wait to see if the symptoms will go away. Do not drive yourself to the hospital. This information is not intended to replace advice given to you by your health care provider. Make sure you discuss any questions you have with your health care provider. Document Revised:  11/23/2021 Document Reviewed: 11/23/2021 Elsevier Patient Education  2024 [employer]  

## 2022-08-02 NOTE — Progress Notes (Signed)
Subjective:  Patient ID: Jonathan Powers, male    DOB: 1950/06/27  Age: 72 y.o. MRN: [id number]  CC: Abdominal Pain   HPI Jonathan Powers presents for f/up ---  He continues to complain of left chest wall pain, cough productive of clear to white sputum, diffuse abdominal pain with no nausea, vomiting, loss of appetite, or weight loss, chronic intermittent diarrhea with no blood in the stool or constipation.  Outpatient Medications Prior to Visit  Medication Sig Dispense Refill   acetaminophen (TYLENOL) 500 MG tablet Take 1,000 mg by mouth every 6 (six) hours as needed for moderate pain.      albuterol (VENTOLIN HFA) 108 (90 Base) MCG/ACT inhaler Inhale 1-2 puffs into the lungs every 6 (six) hours as needed for wheezing or shortness of breath. 18 g 0   cetirizine (ZYRTEC) 10 MG tablet Take 10 mg by mouth daily as needed for allergies.     esomeprazole (NEXIUM) 40 MG capsule Take 1 capsule (40 mg total) by mouth daily. (Patient taking differently: Take 40 mg by mouth daily as needed (heartburn).) 90 capsule 1   fluconazole (DIFLUCAN) 100 MG tablet Take 1 tablet (100 mg total) by mouth daily. 7 tablet 0   fluticasone (FLONASE) 50 MCG/ACT nasal spray Place 1 spray into both nostrils daily as needed for allergies.     ketoconazole (NIZORAL) 2 % cream Apply 1 Application topically daily as needed for irritation (rash).     levothyroxine (SYNTHROID) 175 MCG tablet TAKE 1 TABLET BY MOUTH ONCE DAILY BEFORE BREAKFAST 90 tablet 0   memantine (NAMENDA) 5 MG tablet Take 1 tablet (5 mg at night) for 2 weeks, then increase to 1 tablet (5 mg) twice a day 60 tablet 11   olmesartan-hydrochlorothiazide (BENICAR HCT) 40-25 MG tablet TAKE 1 TABLET BY MOUTH ONCE DAILY AS DIRECTED 90 tablet 0   rosuvastatin (CRESTOR) 5 MG tablet Take 1 tablet (5 mg total) by mouth daily. 90 tablet 1   tadalafil (CIALIS) 5 MG tablet Take 1 tablet (5 mg total) by mouth daily as needed for erectile dysfunction. 10 tablet 2    Testosterone 1.62 % GEL      traMADol (ULTRAM) 50 MG tablet 1-2 tablets every 6 hours as needed for pain 15 tablet 0   traZODone (DESYREL) 100 MG tablet TAKE 1 TABLET BY MOUTH AT BEDTIME AS NEEDED FOR SLEEP 90 tablet 0   No facility-administered medications prior to visit.    ROS Review of Systems  Constitutional:  Positive for unexpected weight change (wt gain). Negative for diaphoresis and fatigue.  HENT: Negative.  Negative for trouble swallowing and voice change.   Respiratory:  Positive for cough. Negative for chest tightness, shortness of breath and wheezing.   Cardiovascular:  Positive for chest pain. Negative for palpitations and leg swelling.  Gastrointestinal:  Positive for abdominal pain and diarrhea. Negative for blood in stool, constipation, nausea and vomiting.  Genitourinary:  Positive for flank pain. Negative for difficulty urinating and hematuria.  Skin: Negative.   Neurological: Negative.  Negative for dizziness and weakness.  Hematological:  Negative for adenopathy. Does not bruise/bleed easily.  Psychiatric/Behavioral:  Positive for confusion and decreased concentration. Negative for dysphoric mood. The patient is not nervous/anxious.     Objective:  BP 138/84 (BP Location: Left Arm, Patient Position: Sitting, Cuff Size: Large)   Pulse 64   Temp 98.2 F (36.8 C) (Oral)   Ht 5\' 11"  (1.803 m)   Wt 254 lb (115.2 kg)  SpO2 94%   BMI 35.43 kg/m   BP Readings from Last 3 Encounters:  08/02/22 138/84  07/20/22 (!) 160/78  07/14/22 (!) 140/72    Wt Readings from Last 3 Encounters:  08/02/22 254 lb (115.2 kg)  07/20/22 250 lb (113.4 kg)  07/14/22 252 lb (114.3 kg)    Physical Exam Vitals reviewed.  Constitutional:      Appearance: He is not ill-appearing.  HENT:     Nose: Nose normal.     Mouth/Throat:     Mouth: Mucous membranes are moist.  Eyes:     General: No scleral icterus.    Conjunctiva/sclera: Conjunctivae normal.  Cardiovascular:     Rate  and Rhythm: Normal rate and regular rhythm.     Heart sounds: No murmur heard. Pulmonary:     Effort: Pulmonary effort is normal.     Breath sounds: No stridor. No wheezing, rhonchi or rales.  Abdominal:     General: Abdomen is protuberant. Bowel sounds are normal. There is no distension.     Palpations: Abdomen is soft. There is no hepatomegaly, splenomegaly or mass.     Tenderness: There is no abdominal tenderness.  Musculoskeletal:        General: Normal range of motion.     Cervical back: Neck supple.     Right lower leg: No edema.     Left lower leg: No edema.  Lymphadenopathy:     Cervical: No cervical adenopathy.  Skin:    General: Skin is warm and dry.  Neurological:     Mental Status: He is alert. Mental status is at baseline.     Lab Results  Component Value Date   WBC 8.8 08/02/2022   HGB 15.0 08/02/2022   HCT 45.3 08/02/2022   PLT 310.0 08/02/2022   GLUCOSE 102 (H) 06/24/2022   CHOL 174 08/02/2022   TRIG 177.0 (H) 08/02/2022   HDL 48.20 08/02/2022   LDLCALC 90 08/02/2022   ALT 18 06/24/2022   AST 23 06/24/2022   NA 139 06/24/2022   K 4.1 06/24/2022   CL 101 06/24/2022   CREATININE 0.95 06/24/2022   BUN 11 06/24/2022   CO2 27 06/24/2022   TSH 4.74 08/02/2022   PSA 1.48 08/02/2022   INR 1.0 03/05/2019   HGBA1C 5.3 09/08/2021    DG Shoulder Left  Result Date: 07/12/2022 CLINICAL DATA:  Status post fall. EXAM: LEFT SHOULDER - 2+ VIEW COMPARISON:  None Available. FINDINGS: A thin linear lucency is seen overlying the lateral aspect of the seventh left rib. This is of indeterminate age. The osseous structures of the left shoulder are intact. Moderate severity degenerative changes are seen involving the left acromioclavicular joint and left glenohumeral articulation. A 5 mm soft tissue calcification is seen adjacent to the greater tubercle of the left humeral head. IMPRESSION: Findings which may represent a nondisplaced seventh left rib fracture of indeterminate  age. Correlation with physical examination is recommended to determine the presence of point tenderness. Electronically Signed   By: Aram Candela M.D.   On: 07/12/2022 14:51   DG Chest 2 View  Result Date: 07/12/2022 CLINICAL DATA:  Status post fall on the left side 5 days ago. EXAM: CHEST - 2 VIEW COMPARISON:  Jun 24, 2022 FINDINGS: The heart size and mediastinal contours are within normal limits. Low lung volumes are noted. Mild atelectasis is seen within the right lung base. There is no evidence of a pleural effusion or pneumothorax. Radiopaque surgical clips are seen within  the right upper quadrant. Multilevel degenerative changes seen throughout the thoracic spine. IMPRESSION: Low lung volumes with mild right basilar atelectasis. Electronically Signed   By: Aram Candela M.D.   On: 07/12/2022 14:46   CT Angio Chest PE W/Cm &/Or Wo Cm  Result Date: 06/24/2022 CLINICAL DATA:  Short of breath, elevated D-dimer EXAM: CT ANGIOGRAPHY CHEST WITH CONTRAST TECHNIQUE: Multidetector CT imaging of the chest was performed using the standard protocol during bolus administration of intravenous contrast. Multiplanar CT image reconstructions and MIPs were obtained to evaluate the vascular anatomy. RADIATION DOSE REDUCTION: This exam was performed according to the departmental dose-optimization program which includes automated exposure control, adjustment of the mA and/or kV according to patient size and/or use of iterative reconstruction technique. CONTRAST:  75mL OMNIPAQUE IOHEXOL 350 MG/ML SOLN COMPARISON:  06/24/2022, 08/03/2021 FINDINGS: Cardiovascular: This is a technically adequate evaluation of the pulmonary vasculature. No filling defects or pulmonary emboli. The heart is unremarkable without pericardial effusion. No evidence of thoracic aortic aneurysm or dissection. Atherosclerosis of the aorta and coronary vasculature. Mediastinum/Nodes: No enlarged mediastinal, hilar, or axillary lymph nodes. Thyroid  gland, trachea, and esophagus demonstrate no significant findings. Lungs/Pleura: No acute airspace disease, effusion, or pneumothorax. Central airways are patent. The 3 mm right upper lobe nodule seen on prior study has a more linear configuration on this exam, measuring up to 5 x 10 by 6 mm, reference image 35/6. Coarse calcifications are seen within this nodule. New tree in bud nodularity has developed within the periphery of the left upper lobe, reference images 60-63 of series 6, with nodules measuring up to 4 mm in size. Upper Abdomen: No acute abnormality. Musculoskeletal: No acute or destructive bony lesions. Reconstructed images demonstrate no additional findings. Review of the MIP images confirms the above findings. IMPRESSION: 1. No evidence of pulmonary embolus. 2. Bilateral upper lobe nodularity as above, largest right upper lobe nodule now with a mean diameter of 7 mm. I would favor benign inflammatory or infectious etiology, though repeat CT at 12-18 months (from today's scan) is considered optional for low-risk patients, but is recommended for high-risk patients. This recommendation follows the consensus statement: Guidelines for Management of Incidental Pulmonary Nodules Detected on CT Images: From the Fleischner Society 2017; Radiology 2017; 284:228-243. 3.  Aortic Atherosclerosis (ICD10-I70.0). Electronically Signed   By: Sharlet Salina M.D.   On: 06/24/2022 20:29   DG Chest 2 View  Result Date: 06/24/2022 CLINICAL DATA:  Short of breath. EXAM: CHEST - 2 VIEW COMPARISON:  04/23/2022. FINDINGS: Cardiac silhouette normal in size and configuration. No mediastinal or hilar masses. No evidence of adenopathy. Clear lungs.  No pleural effusion or pneumothorax. Skeletal structures are intact. IMPRESSION: No active cardiopulmonary disease. Electronically Signed   By: Amie Portland M.D.   On: 06/24/2022 16:40   MR BRAIN WO CONTRAST  Result Date: 05/22/2022 CLINICAL DATA:  Provided history: Memory  impairment. EXAM: MRI HEAD WITHOUT CONTRAST TECHNIQUE: Multiplanar, multiecho pulse sequences of the brain and surrounding structures were obtained without intravenous contrast. COMPARISON:  Brain MRI 07/26/2020. FINDINGS: Brain: Mild generalized parenchymal atrophy. No cortical encephalomalacia is identified. No significant cerebral white matter disease for age. There is no acute infarct. No evidence of an intracranial mass. No chronic intracranial blood products. No extra-axial fluid collection. No midline shift. Vascular: Maintained flow voids within the proximal large arterial vessels. Skull and upper cervical spine: No focal suspicious marrow lesion. Sinuses/Orbits: No mass or acute finding within the imaged orbits. Mild mucosal thickening within the  right frontal, bilateral ethmoid and left maxillary sinuses. IMPRESSION: 1.  No evidence of an acute intracranial abnormality. 2. Mild generalized parenchymal atrophy. 3. Otherwise unremarkable non-contrast MRI appearance of the brain. 4. Mild paranasal sinus mucosal thickening. Electronically Signed   By: Jackey Loge D.O.   On: 05/22/2022 17:19     Assessment & Plan:   Generalized abdominal tenderness with rebound tenderness- Exam and labs are reassuring. -     Lipase; Future -     Amylase; Future -     CBC with Differential/Platelet; Future  Left flank pain -     Urinalysis, Routine w reflex microscopic; Future  Essential hypertension- His blood pressure is well-controlled. -     Urinalysis, Routine w reflex microscopic; Future -     CBC with Differential/Platelet; Future  Benign prostatic hyperplasia without lower urinary tract symptoms -     PSA; Future  Acquired hypothyroidism- He is euthyroid. -     Lipid panel; Future -     TSH; Future  Exocrine pancreatic insufficiency- Will restart the pancreatic supplement. -     Zenpep; Take 1 capsule by mouth 3 (three) times daily with meals.  Dispense: 270 capsule; Refill: 1     Follow-up:  Return in about 3 months (around 11/02/2022).  Sanda Linger, MD

## 2022-08-08 ENCOUNTER — Encounter (HOSPITAL_COMMUNITY): Payer: Self-pay

## 2022-08-08 ENCOUNTER — Emergency Department (HOSPITAL_COMMUNITY)
Admission: EM | Admit: 2022-08-08 | Discharge: 2022-08-09 | Disposition: A | Discharge status: Home / Self Care | Payer: Medicare Other | Attending: Student | Admitting: Student

## 2022-08-08 ENCOUNTER — Other Ambulatory Visit: Payer: Self-pay

## 2022-08-08 DIAGNOSIS — Z79899 Other long term (current) drug therapy: Secondary | ICD-10-CM | POA: Insufficient documentation

## 2022-08-08 DIAGNOSIS — M79662 Pain in left lower leg: Secondary | ICD-10-CM | POA: Diagnosis not present

## 2022-08-08 DIAGNOSIS — Z85828 Personal history of other malignant neoplasm of skin: Secondary | ICD-10-CM | POA: Diagnosis not present

## 2022-08-08 DIAGNOSIS — M7989 Other specified soft tissue disorders: Secondary | ICD-10-CM | POA: Diagnosis not present

## 2022-08-08 DIAGNOSIS — Z7901 Long term (current) use of anticoagulants: Secondary | ICD-10-CM | POA: Insufficient documentation

## 2022-08-08 DIAGNOSIS — E039 Hypothyroidism, unspecified: Secondary | ICD-10-CM | POA: Insufficient documentation

## 2022-08-08 DIAGNOSIS — L03116 Cellulitis of left lower limb: Secondary | ICD-10-CM

## 2022-08-08 DIAGNOSIS — Z96643 Presence of artificial hip joint, bilateral: Secondary | ICD-10-CM | POA: Insufficient documentation

## 2022-08-08 DIAGNOSIS — I10 Essential (primary) hypertension: Secondary | ICD-10-CM | POA: Insufficient documentation

## 2022-08-08 LAB — CBC WITH DIFFERENTIAL/PLATELET
Abs Immature Granulocytes: 0.01 10*3/uL (ref 0.00–0.07)
Basophils Absolute: 0 10*3/uL (ref 0.0–0.1)
Basophils Relative: 0 %
Eosinophils Absolute: 0.1 10*3/uL (ref 0.0–0.5)
Eosinophils Relative: 1 %
HCT: 48.7 % (ref 39.0–52.0)
Hemoglobin: 16.4 g/dL (ref 13.0–17.0)
Immature Granulocytes: 0 %
Lymphocytes Relative: 18 %
Lymphs Abs: 1.5 10*3/uL (ref 0.7–4.0)
MCH: 30.1 pg (ref 26.0–34.0)
MCHC: 33.7 g/dL (ref 30.0–36.0)
MCV: 89.4 fL (ref 80.0–100.0)
Monocytes Absolute: 0.9 10*3/uL (ref 0.1–1.0)
Monocytes Relative: 11 %
Neutro Abs: 5.8 10*3/uL (ref 1.7–7.7)
Neutrophils Relative %: 70 %
Platelets: 269 10*3/uL (ref 150–400)
RBC: 5.45 MIL/uL (ref 4.22–5.81)
RDW: 13.1 % (ref 11.5–15.5)
WBC: 8.4 10*3/uL (ref 4.0–10.5)
nRBC: 0 % (ref 0.0–0.2)

## 2022-08-08 LAB — COMPREHENSIVE METABOLIC PANEL
ALT: 16 U/L (ref 0–44)
AST: 18 U/L (ref 15–41)
Albumin: 4.1 g/dL (ref 3.5–5.0)
Alkaline Phosphatase: 64 U/L (ref 38–126)
Anion gap: 10 (ref 5–15)
BUN: 12 mg/dL (ref 8–23)
CO2: 23 mmol/L (ref 22–32)
Calcium: 8.9 mg/dL (ref 8.9–10.3)
Chloride: 103 mmol/L (ref 98–111)
Creatinine, Ser: 0.97 mg/dL (ref 0.61–1.24)
GFR, Estimated: >60 mL/min (ref >60)
Glucose, Bld: 107 mg/dL — ABNORMAL HIGH (ref 70–99)
Potassium: 3.8 mmol/L (ref 3.5–5.1)
Sodium: 136 mmol/L (ref 135–145)
Total Bilirubin: 1.4 mg/dL — ABNORMAL HIGH (ref 0.3–1.2)
Total Protein: 7.4 g/dL (ref 6.5–8.1)

## 2022-08-08 LAB — PROTIME-INR
INR: 1.1 (ref 0.8–1.2)
Prothrombin Time: 14.7 seconds (ref 11.4–15.2)

## 2022-08-08 LAB — LACTIC ACID, PLASMA: Lactic Acid, Venous: 1 mmol/L (ref 0.5–1.9)

## 2022-08-08 NOTE — ED Triage Notes (Signed)
Pt reports redness to his left lower leg for the past 48 hours, warm to touch. Denies fever or chills.

## 2022-08-09 DIAGNOSIS — M79662 Pain in left lower leg: Secondary | ICD-10-CM | POA: Diagnosis not present

## 2022-08-09 LAB — D-DIMER, QUANTITATIVE: D-Dimer, Quant: 0.43 ug/mL-FEU (ref 0.00–0.50)

## 2022-08-09 LAB — LACTIC ACID, PLASMA: Lactic Acid, Venous: 0.9 mmol/L (ref 0.5–1.9)

## 2022-08-09 MED ORDER — DEXTROSE 5 % IV SOLN
1500.0000 mg | Freq: Once | INTRAVENOUS | Status: AC
  Administered 2022-08-09: 1500 mg via INTRAVENOUS
  Filled 2022-08-09: qty 75

## 2022-08-09 NOTE — ED Provider Notes (Signed)
[place] EMERGENCY DEPARTMENT AT [facility] Provider Note  CSN: [id number] Arrival date & time: 08/08/22 2113  Chief Complaint(s) Cellulitis  HPI Jonathan Powers is a 72 y.o. male with PMH recurrent cellulitis, mild cognitive impairment with memory loss, Hashimoto's thyroiditis who presents emergency room for evaluation of left lower extremity pain and swelling.  States that erythema and swelling has gotten progressively worse of the last 48 hours.  No associated fevers, chest pain, shortness of breath, abdominal pain, nausea, vomiting or other systemic symptoms.  Patient seen most recently in March 2024 where he failed Ancef and Duricef but ultimately improved with dalbavancin.   Past Medical History Past Medical History:  Diagnosis Date   Age-related osteoporosis with current pathological fracture 03/11/2018   Allergic rhinitis 05/17/2006   Amnestic MCI (mild cognitive impairment with memory loss) 10/08/2020   BPH (benign prostatic hyperplasia) 09/06/2012   Cellulitis, right leg    Recurrent R hip cellulitis 1/08,3/08    Chronic fatigue, unspecified    Chronic hyperglycemia 06/29/2020   Chronic right-sided low back pain without sciatica 07/21/2016   Chronic sinusitis 07/10/2021   Diverticulitis    Essential hypertension 05/17/2006   2014     Impression  Exercise Capacity:  Lexiscan with low level exercise.  BP Response:  Normal blood pressure response.  Clinical Symptoms:  No significant symptoms noted.  ECG Impression:  No significant ST segment change suggestive of ischemia.  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   Flexural eczema 07/24/2017   GERD (gastroesophageal reflux disease)    Gout    Hashimoto's thyroiditis    History of skin cancer    Melanoma on back    Hypogonadism male    low T, a/w ED   Hypothyroidism 11/11/2018   Insomnia 11/10/2018   Left thyroid nodule 2008   on [place], decrease size in 2011 [place]   Lung nodule 07/25/2021   Migraines    Atypical and ocular   Obesity (BMI 30-39.9) 05/17/2006   OSA (obstructive sleep apnea) 01/27/2013   HST 01/2013:  AHI 68/hr with obstructive and central events.   09/2015 compliance report> > 4 hours 80% of days, used 25/30 days   Primary osteoarthritis 05/17/2006   Reactive thrombocytosis 09/17/2021   Recurrent genital HSV (herpes simplex virus) infection 11/12/2018   Superior mesenteric artery aneurysm 07/12/2015   DEC 2019     IMPRESSION:  VASCULAR     1. No acute findings.  2. 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  3. Ectatic abdominal aorta at risk for aneurysm development.  Recommend followup by ultrasound in 5 years. This recommendation  follows ACR consensus guidelines: White Paper of the ACR Incidental  Findings Committee II on Vascular Findings. J Am Coll Radiol 2013;  [phone].  4. 1.   Symptomatic PVCs 09/06/2012   Patient Active Problem List   Diagnosis Date Noted   Left flank pain 08/02/2022   Generalized abdominal tenderness with rebound tenderness 08/02/2022   Rash 07/22/2022   Rib fracture 07/22/2022   Chronic fatigue, unspecified    Exocrine pancreatic insufficiency 12/03/2021   Reactive thrombocytosis 09/17/2021   Lung nodule 07/25/2021   Chronic sinusitis 07/10/2021   Acute recurrent pansinusitis 02/17/2021   Deviated septum 02/17/2021   Nasal turbinate hypertrophy 02/17/2021  Amnestic MCI (mild cognitive impairment with memory loss) 10/08/2020   Chronic hyperglycemia 06/29/2020   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   Hypothyroidism 11/11/2018   Cellulitis of left lower extremity 11/10/2018   Insomnia 11/10/2018   GERD (gastroesophageal reflux disease) 11/10/2018   Age-related osteoporosis with current pathological fracture 03/11/2018   Flexural eczema  07/24/2017   Chronic right-sided low back pain without sciatica 07/21/2016   Superior mesenteric artery aneurysm 07/12/2015   Left thyroid nodule    OSA (obstructive sleep apnea) 01/27/2013   Symptomatic PVCs 09/06/2012   BPH (benign prostatic hyperplasia) 09/06/2012   Obesity (BMI 30-39.9) 05/17/2006   Essential hypertension 05/17/2006   Allergic rhinitis 05/17/2006   Primary osteoarthritis 05/17/2006   Home Medication(s) Prior to Admission medications   Medication Sig Start Date End Date Taking? Authorizing Provider  acetaminophen (TYLENOL) 500 MG tablet Take 1,000 mg by mouth every 6 (six) hours as needed for moderate pain.     [provider]  albuterol (VENTOLIN HFA) 108 (90 Base) MCG/ACT inhaler Inhale 1-2 puffs into the lungs every 6 (six) hours as needed for wheezing or shortness of breath. 06/20/22   Raspet, Noberto Retort, PA-C  cetirizine (ZYRTEC) 10 MG tablet Take 10 mg by mouth daily as needed for allergies.    [provider]  esomeprazole (NEXIUM) 40 MG capsule Take 1 capsule (40 mg total) by mouth daily. Patient taking differently: Take 40 mg by mouth daily as needed (heartburn). 03/19/21   [name], MD  fluconazole (DIFLUCAN) 100 MG tablet Take 1 tablet (100 mg total) by mouth daily. 07/20/22   Corwin Levins, MD  fluticasone (FLONASE) 50 MCG/ACT nasal spray Place 1 spray into both nostrils daily as needed for allergies. 07/06/21   [provider]  ketoconazole (NIZORAL) 2 % cream Apply 1 Application topically daily as needed for irritation (rash). 08/16/21   [provider]  levothyroxine (SYNTHROID) 175 MCG tablet TAKE 1 TABLET BY MOUTH ONCE DAILY BEFORE BREAKFAST 01/28/22   [name], MD  memantine (NAMENDA) 5 MG tablet Take 1 tablet (5 mg at night) for 2 weeks, then increase to 1 tablet (5 mg) twice a day 04/27/22   Marcos Eke, PA-C  olmesartan-hydrochlorothiazide (BENICAR HCT) 40-25 MG tablet TAKE 1 TABLET BY MOUTH ONCE DAILY AS  DIRECTED 05/22/22   Yates Decamp, MD  Pancrelipase, Lip-Prot-Amyl, (ZENPEP) 25000-79000 units CPEP Take 1 capsule by mouth 3 (three) times daily with meals. 08/02/22   [name], MD  rosuvastatin (CRESTOR) 5 MG tablet Take 1 tablet (5 mg total) by mouth daily. 11/01/21   [name], MD  tadalafil (CIALIS) 5 MG tablet Take 1 tablet (5 mg total) by mouth daily as needed for erectile dysfunction. 12/16/21   [name], MD  Testosterone 1.62 % GEL  11/26/20   [provider]  traMADol Janean Sark) 50 MG tablet 1-2 tablets every 6 hours as needed for pain 07/12/22   Arby Barrette, MD  traZODone (DESYREL) 100 MG tablet TAKE 1 TABLET BY MOUTH AT BEDTIME AS NEEDED FOR SLEEP 06/11/22   [name], MD  apixaban (ELIQUIS) 2.5 MG TABS tablet Take 1 tablet (2.5 mg total) by mouth 2 (two) times daily. 03/07/19 05/16/19  Allena Katz, PA-C  Past Surgical History Past Surgical History:  Procedure Laterality Date   CARPAL TUNNEL RELEASE Left 03/30/2017   Procedure: CARPAL TUNNEL RELEASE;  Surgeon: Valeria Batman, MD;  Location: [facility];  Service: Orthopedics;  Laterality: Left;   CHOLECYSTECTOMY  2004   CTR Bilateral 1982   CYSTOSCOPY  1974   FOOT FUSION Left 06/22/2017   HEMORRHOID SURGERY N/A 03/30/2017   Procedure: HEMORRHOIDECTOMY;  Surgeon: Abigail Miyamoto, MD;  Location: [facility];  Service: General;  Laterality: N/A;   open reduction L little finger Left 1970   Repair digital thumb, left Left 1990   Right shoulder cuff repair Right 09/15/11   Right shoulder SAD, DCR Right 02/05/11   TOTAL HIP ARTHROPLASTY Left 07/26/06   TOTAL HIP ARTHROPLASTY Right 1999   TOTAL HIP REVISION Left 03/07/2019   Procedure: LEFT TOTAL HIP REVISION POSTERIOR APPROACH;  Surgeon: Gean Birchwood, MD;  Location: [facility];  Service: Orthopedics;   Laterality: Left;   Family History Family History  Problem Relation Age of Onset   Arthritis Father    Heart disease Father    Diabetes Father    Vascular Disease Father        AoBifem   Arthritis Mother    Diabetes Mother    Multiple sclerosis Mother    Cancer Paternal Grandmother        "GI"   Cancer Paternal Grandfather        stomach cancer   Thyroid cancer Sister    Uterine cancer Sister     Social History Social History   Tobacco Use   Smoking status: Never   Smokeless tobacco: Never  Vaping Use   Vaping Use: Never used  Substance Use Topics   Alcohol use: Yes    Alcohol/week: 0.0 standard drinks of alcohol    Comment: rare   Drug use: No   Allergies Nsaids and Tolmetin  Review of Systems Review of Systems  Skin:  Positive for rash.    Physical Exam Vital Signs  I have reviewed the triage vital signs BP (!) 155/83 (BP Location: Right Arm)   Pulse (!) 56   Temp 98.8 F (37.1 C)   Resp 18   SpO2 95%   Physical Exam Constitutional:      General: He is not in acute distress.    Appearance: Normal appearance.  HENT:     Head: Normocephalic and atraumatic.     Nose: No congestion or rhinorrhea.  Eyes:     General:        Right eye: No discharge.        Left eye: No discharge.     Extraocular Movements: Extraocular movements intact.     Pupils: Pupils are equal, round, and reactive to light.  Cardiovascular:     Rate and Rhythm: Normal rate and regular rhythm.     Heart sounds: No murmur heard. Pulmonary:     Effort: No respiratory distress.     Breath sounds: No wheezing or rales.  Abdominal:     General: There is no distension.     Tenderness: There is no abdominal tenderness.  Musculoskeletal:        General: Normal range of motion.     Cervical back: Normal range of motion.  Skin:    General: Skin is warm and dry.     Findings: Erythema present.  Neurological:     General: No focal deficit present.     Mental Status: He is  alert.      ED Results and Treatments Labs (all labs ordered are listed, but only abnormal results are displayed) Labs Reviewed  COMPREHENSIVE METABOLIC PANEL - Abnormal; Notable for the following components:      Result Value   Glucose, Bld 107 (*)    Total Bilirubin 1.4 (*)    All other components within normal limits  CULTURE, BLOOD (ROUTINE X 2)  CULTURE, BLOOD (ROUTINE X 2)  LACTIC ACID, PLASMA  CBC WITH DIFFERENTIAL/PLATELET  PROTIME-INR  LACTIC ACID, PLASMA  D-DIMER, QUANTITATIVE                                                                                                                          Radiology No results found.  Pertinent labs & imaging results that were available during my care of the patient were reviewed by me and considered in my medical decision making (see MDM for details).  Medications Ordered in ED Medications  dalbavancin (DALVANCE) 1,500 mg in dextrose 5 % 500 mL IVPB (has no administration in time range)                                                                                                                                     Procedures Procedures  (including critical care time)  Medical Decision Making / ED Course   This patient presents to the ED for concern of left lower extremity erythema, this involves an extensive number of treatment options, and is a complaint that carries with it a high risk of complications and morbidity.  The differential diagnosis includes cellulitis, DVT, thrombophlebitis, stasis dermatitis  MDM: Patient seen emergency room for evaluation of left lower extremity erythema.  Physical exam reveals tender erythema extending over the entirety of the left shin.  Denies numbness, tingling, weakness to the area.  Laboratory evaluation largely unremarkable including no leukocytosis, D-dimer negative and low suspicion for DVT.  Patient has previously failed penicillin/cephalosporin based therapy but did have some success  with dalbavancin in March and thus we will trial dalbavancin again in an attempt to keep the patient out of the hospital.  Patient has remained hemodynamically stable throughout his ER stay today and currently does not meet inpatient criteria for admission.  A referral was placed to outpatient infectious disease given dalbavancin administration and patient will monitor cellulitis for extension past markings.  Patient then discharged with outpatient follow-up  and return precautions of which he and his son voiced understanding   Additional history obtained: -Additional history obtained from son -External records from outside source obtained and reviewed including: Chart review including previous notes, labs, imaging, consultation notes   Lab Tests: -I ordered, reviewed, and interpreted labs.   The pertinent results include:   Labs Reviewed  COMPREHENSIVE METABOLIC PANEL - Abnormal; Notable for the following components:      Result Value   Glucose, Bld 107 (*)    Total Bilirubin 1.4 (*)    All other components within normal limits  CULTURE, BLOOD (ROUTINE X 2)  CULTURE, BLOOD (ROUTINE X 2)  LACTIC ACID, PLASMA  CBC WITH DIFFERENTIAL/PLATELET  PROTIME-INR  LACTIC ACID, PLASMA  D-DIMER, QUANTITATIVE      Medicines ordered and prescription drug management: Meds ordered this encounter  Medications   dalbavancin (DALVANCE) 1,500 mg in dextrose 5 % 500 mL IVPB    Order Specific Question:   Indication:    Answer:   Cellulitis    -I have reviewed the patients home medicines and have made adjustments as needed  Critical interventions none   Cardiac Monitoring: The patient was maintained on a cardiac monitor.  I personally viewed and interpreted the cardiac monitored which showed an underlying rhythm of: Sinus bradycardia  Social Determinants of Health:  Factors impacting patients care include: none   Reevaluation: After the interventions noted above, I reevaluated the patient and  found that they have :stayed the same  Co morbidities that complicate the patient evaluation  Past Medical History:  Diagnosis Date   Age-related osteoporosis with current pathological fracture 03/11/2018   Allergic rhinitis 05/17/2006   Amnestic MCI (mild cognitive impairment with memory loss) 10/08/2020   BPH (benign prostatic hyperplasia) 09/06/2012   Cellulitis, right leg    Recurrent R hip cellulitis 1/08,3/08    Chronic fatigue, unspecified    Chronic hyperglycemia 06/29/2020   Chronic right-sided low back pain without sciatica 07/21/2016   Chronic sinusitis 07/10/2021   Diverticulitis    Essential hypertension 05/17/2006   2014     Impression  Exercise Capacity:  Lexiscan with low level exercise.  BP Response:  Normal blood pressure response.  Clinical Symptoms:  No significant symptoms noted.  ECG Impression:  No significant ST segment change suggestive of ischemia.  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   09/2015 compliance report> > 4 hours 80% of days, used 25/30 days   Primary osteoarthritis 05/17/2006   Reactive thrombocytosis 09/17/2021   Recurrent genital HSV (herpes simplex virus) infection 11/12/2018   Superior mesenteric artery aneurysm 07/12/2015   DEC 2019      IMPRESSION:  VASCULAR  1. No acute findings.  2. 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  3. Ectatic abdominal aorta at risk for aneurysm development.  Recommend followup by ultrasound in 5 years. This recommendation  follows ACR consensus guidelines: White Paper of the ACR Incidental  Findings Committee II on Vascular Findings. J Am Coll Radiol 2013;  10:789-794.  4. 1.   Symptomatic PVCs 09/06/2012      Dispostion: I considered admission for this patient, but at this time he does not meet inpatient criteria for admission and he is safe for discharge with outpatient follow-up     Final Clinical Impression(s) / ED Diagnoses Final diagnoses:  None     @PCDICTATION @    Glendora Score, MD 08/09/22 [phone]

## 2022-08-11 LAB — CULTURE, BLOOD (ROUTINE X 2)

## 2022-08-12 LAB — CULTURE, BLOOD (ROUTINE X 2)

## 2022-08-13 LAB — CULTURE, BLOOD (ROUTINE X 2)
Culture: NO GROWTH
Culture: NO GROWTH

## 2022-08-16 ENCOUNTER — Telehealth: Payer: Self-pay

## 2022-08-16 ENCOUNTER — Other Ambulatory Visit: Payer: Self-pay | Admitting: Cardiology

## 2022-08-16 DIAGNOSIS — I10 Essential (primary) hypertension: Secondary | ICD-10-CM

## 2022-08-16 NOTE — Telephone Encounter (Signed)
Transition Care Management Unsuccessful Follow-up Telephone Call  Date of discharge and from where:  08/09/2022 [facility]  Attempts:  1st Attempt  Reason for unsuccessful TCM follow-up call:  No answer/busy  Loralye Loberg [facility]  [facility] Population Health Community Resource Care Guide   [email or web address]  ?? [phone]   Website: [email or web address]  [email or web address]

## 2022-08-17 ENCOUNTER — Telehealth: Payer: Self-pay

## 2022-08-17 NOTE — Telephone Encounter (Signed)
Transition Care Management Unsuccessful Follow-up Telephone Call  Date of discharge and from where:  08/09/2022 [facility]  Attempts:  2nd Attempt  Reason for unsuccessful TCM follow-up call:  Left voice message  Ramah Langhans [facility]  [facility] Population Health Community Resource Care Guide   [email or web address]  ?? [phone]   Website: [email or web address]  [email or web address]

## 2022-08-21 DIAGNOSIS — H2513 Age-related nuclear cataract, bilateral: Secondary | ICD-10-CM | POA: Diagnosis not present

## 2022-08-21 DIAGNOSIS — H04123 Dry eye syndrome of bilateral lacrimal glands: Secondary | ICD-10-CM | POA: Diagnosis not present

## 2022-08-21 DIAGNOSIS — H35033 Hypertensive retinopathy, bilateral: Secondary | ICD-10-CM | POA: Diagnosis not present

## 2022-08-21 DIAGNOSIS — H43811 Vitreous degeneration, right eye: Secondary | ICD-10-CM | POA: Diagnosis not present

## 2022-08-21 DIAGNOSIS — H0102B Squamous blepharitis left eye, upper and lower eyelids: Secondary | ICD-10-CM | POA: Diagnosis not present

## 2022-08-21 DIAGNOSIS — H0102A Squamous blepharitis right eye, upper and lower eyelids: Secondary | ICD-10-CM | POA: Diagnosis not present

## 2022-08-22 ENCOUNTER — Encounter: Payer: Self-pay | Admitting: Family

## 2022-08-22 ENCOUNTER — Ambulatory Visit (INDEPENDENT_AMBULATORY_CARE_PROVIDER_SITE_OTHER): Payer: Medicare Other | Admitting: Family

## 2022-08-22 DIAGNOSIS — B353 Tinea pedis: Secondary | ICD-10-CM

## 2022-08-22 MED ORDER — TERBINAFINE HCL 1 % EX CREA
1.0000 | TOPICAL_CREAM | Freq: Two times a day (BID) | CUTANEOUS | 0 refills | Status: DC
Start: 2022-08-22 — End: 2022-08-30

## 2022-08-22 MED ORDER — TERBINAFINE HCL 250 MG PO TABS: 250.0000 mg | ORAL_TABLET | Freq: Every day | ORAL | 0 refills | Status: DC

## 2022-08-22 NOTE — Progress Notes (Signed)
Office Visit Note   Patient: Jonathan Powers           Date of Birth: 1950-04-04           MRN: [id number] Visit Date: 08/22/2022              Requested by: [name], MD [address] [place],  [place] [address] PCP: [name], MD  Chief Complaint  Patient presents with   Left Foot - Wound Check      HPI: The patient is a 72 year old gentleman who presents today for evaluation of bilateral feet left worse than right pain and ulcer in the webspaces.  He is concerned he may have an infection on the left.  He has previously used Pieces of vive yet socks in the webspaces without improvement   Assessment & Plan: Visit Diagnoses: No diagnosis found.  Plan: Discussed foot care.  Keeping the webspaces dry given some silver cell strips he may try discussed possibility of using compression stockings or wall socks to avoid moisture as well.  He will use Lamisil topically discussed getting this over-the-counter.  He will also begin oral Lamisil  Follow-Up Instructions: Return in about 2 weeks (around 09/05/2022).   Ortho Exam  Patient is alert, oriented, no adenopathy, well-dressed, normal affect, normal respiratory effort. On examination bilateral feet and the third and fourth webspaces bilateral feet there is maceration and ulceration left worse than right consistent with fungal infection there is no erythema consistent with cellulitis no warmth no drainage  Imaging: No results found. No images are attached to the encounter.  Labs: Lab Results  Component Value Date   HGBA1C 5.3 09/08/2021   HGBA1C 5.8 02/09/2021   HGBA1C 5.8 06/29/2020   ESRSEDRATE 38 (H) 04/23/2022   ESRSEDRATE 86 (H) 09/11/2021   ESRSEDRATE 9 07/09/2018   CRP 2.3 (H) 04/23/2022   CRP 1.2 09/15/2021   CRP 4.2 (H) 09/11/2021   LABURIC 9.0 (H) 11/28/2019   LABURIC 8.1 (H) 07/09/2018   LABURIC 6.7 02/05/2018   REPTSTATUS 08/13/2022 FINAL 08/08/2022   CULT  08/08/2022    NO GROWTH 5  DAYS Performed at [facility], [address] [place], [place] [address]      Lab Results  Component Value Date   ALBUMIN 4.1 08/08/2022   ALBUMIN 3.5 06/24/2022   ALBUMIN 3.6 04/23/2022    Lab Results  Component Value Date   MG 2.0 09/11/2021   MG 1.9 09/10/2021   MG 1.9 09/08/2021   Lab Results  Component Value Date   VD25OH 55 07/09/2018   VD25OH 54 04/04/2018   VD25OH 46 03/01/2016    No results found for: "PREALBUMIN"    Latest Ref Rng & Units 08/08/2022   10:06 PM 08/02/2022    3:01 PM 06/24/2022    3:51 PM  CBC EXTENDED  WBC 4.0 - 10.5 K/uL 8.4  8.8  6.5   RBC 4.22 - 5.81 MIL/uL 5.45  5.07  5.09   Hemoglobin 13.0 - 17.0 g/dL 82.9  56.2  13.0   HCT 39.0 - 52.0 % 48.7  45.3  45.7   Platelets 150 - 400 K/uL 269  310.0  308   NEUT# 1.7 - 7.7 K/uL 5.8  5.9    Lymph# 0.7 - 4.0 K/uL 1.5  1.7       There is no height or weight on file to calculate BMI.  Orders:  No orders of the defined types were placed in  this encounter.  Meds ordered this encounter  Medications   terbinafine (LAMISIL) 250 MG tablet    Sig: Take 1 tablet (250 mg total) by mouth daily.    Dispense:  42 tablet    Refill:  0   terbinafine (LAMISIL AT) 1 % cream    Sig: Apply 1 Application topically 2 (two) times daily.    Dispense:  30 g    Refill:  0     Procedures: No procedures performed  Clinical Data: No additional findings.  ROS:  All other systems negative, except as noted in the HPI. Review of Systems  Objective: Vital Signs: There were no vitals taken for this visit.  Specialty Comments:  No specialty comments available.  PMFS History: Patient Active Problem List   Diagnosis Date Noted   Left flank pain 08/02/2022   Generalized abdominal tenderness with rebound tenderness 08/02/2022   Rash 07/22/2022   Rib fracture 07/22/2022   Chronic fatigue, unspecified    Exocrine pancreatic insufficiency 12/03/2021   Reactive thrombocytosis 09/17/2021   Lung nodule  07/25/2021   Chronic sinusitis 07/10/2021   Acute recurrent pansinusitis 02/17/2021   Deviated septum 02/17/2021   Nasal turbinate hypertrophy 02/17/2021   Amnestic MCI (mild cognitive impairment with memory loss) 10/08/2020   Chronic hyperglycemia 06/29/2020   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   Hypothyroidism 11/11/2018   Cellulitis of left lower extremity 11/10/2018   Insomnia 11/10/2018   GERD (gastroesophageal reflux disease) 11/10/2018   Age-related osteoporosis with current pathological fracture 03/11/2018   Flexural eczema 07/24/2017   Chronic right-sided low back pain without sciatica 07/21/2016   Superior mesenteric artery aneurysm 07/12/2015   Left thyroid nodule    OSA (obstructive sleep apnea) 01/27/2013   Symptomatic PVCs 09/06/2012   BPH (benign prostatic hyperplasia) 09/06/2012   Obesity (BMI 30-39.9) 05/17/2006   Essential hypertension 05/17/2006   Allergic rhinitis 05/17/2006   Primary osteoarthritis 05/17/2006   Past Medical History:  Diagnosis Date   Age-related osteoporosis with current pathological fracture 03/11/2018   Allergic rhinitis 05/17/2006   Amnestic MCI (mild cognitive impairment with memory loss) 10/08/2020   BPH (benign prostatic hyperplasia) 09/06/2012   Cellulitis, right leg    Recurrent R hip cellulitis 1/08,3/08    Chronic fatigue, unspecified    Chronic hyperglycemia 06/29/2020   Chronic right-sided low back pain without sciatica 07/21/2016   Chronic sinusitis 07/10/2021   Diverticulitis    Essential hypertension 05/17/2006   2014     Impression  Exercise Capacity:  Lexiscan with low level exercise.  BP Response:  Normal blood pressure response.  Clinical Symptoms:  No significant symptoms noted.  ECG Impression:  No significant ST segment change suggestive of ischemia.  Comparison with Prior Nuclear Study: No images to compare     Overall Impression:  Low risk stress nuclear study There is mild apical thinning but no    Exocrine  pancreatic insufficiency 12/03/2021   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   Flexural eczema 07/24/2017   GERD (gastroesophageal reflux disease)    Gout    Hashimoto's thyroiditis    History of skin cancer    Melanoma on back   Hypogonadism male    low T, a/w ED   Hypothyroidism 11/11/2018   Insomnia 11/10/2018   Left thyroid nodule 2008   on [place], decrease size in 2011 [place]   Lung nodule 07/25/2021   Migraines    Atypical and ocular   Obesity (BMI 30-39.9) 05/17/2006   OSA (obstructive sleep apnea)  01/27/2013   HST 01/2013:  AHI 68/hr with obstructive and central events.   09/2015 compliance report> > 4 hours 80% of days, used 25/30 days   Primary osteoarthritis 05/17/2006   Reactive thrombocytosis 09/17/2021   Recurrent genital HSV (herpes simplex virus) infection 11/12/2018   Superior mesenteric artery aneurysm 07/12/2015   DEC 2019     IMPRESSION:  VASCULAR     1. No acute findings.  2. 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  3. Ectatic abdominal aorta at risk for aneurysm development.  Recommend followup by ultrasound in 5 years. This recommendation  follows ACR consensus guidelines: White Paper of the ACR Incidental  Findings Committee II on Vascular Findings. J Am Coll Radiol 2013;  10:789-794.  4. 1.   Symptomatic PVCs 09/06/2012    Family History  Problem Relation Age of Onset   Arthritis Father    Heart disease Father    Diabetes Father    Vascular Disease Father        AoBifem   Arthritis Mother    Diabetes Mother    Multiple sclerosis Mother    Cancer Paternal Grandmother        "GI"   Cancer Paternal Grandfather        stomach cancer   Thyroid cancer Sister    Uterine cancer Sister     Past Surgical History:  Procedure Laterality Date   CARPAL TUNNEL RELEASE Left 03/30/2017   Procedure: CARPAL TUNNEL RELEASE;  Surgeon: Valeria Batman, MD;  Location: [facility];  Service: Orthopedics;  Laterality: Left;   CHOLECYSTECTOMY  2004    CTR Bilateral 1982   CYSTOSCOPY  1974   FOOT FUSION Left 06/22/2017   HEMORRHOID SURGERY N/A 03/30/2017   Procedure: HEMORRHOIDECTOMY;  Surgeon: Abigail Miyamoto, MD;  Location:  SURGERY CENTER;  Service: General;  Laterality: N/A;   open reduction L little finger Left 1970   Repair digital thumb, left Left 1990   Right shoulder cuff repair Right 09/15/11   Right shoulder SAD, DCR Right 02/05/11   TOTAL HIP ARTHROPLASTY Left 07/26/06   TOTAL HIP ARTHROPLASTY Right 1999   TOTAL HIP REVISION Left 03/07/2019   Procedure: LEFT TOTAL HIP REVISION POSTERIOR APPROACH;  Surgeon: Gean Birchwood, MD;  Location: [facility];  Service: Orthopedics;  Laterality: Left;   Social History   Occupational History   Occupation: Orthopedic PA    Employer: [employer]  Tobacco Use   Smoking status: Never   Smokeless tobacco: Never  Vaping Use   Vaping Use: Never used  Substance and Sexual Activity   Alcohol use: Yes    Alcohol/week: 0.0 standard drinks of alcohol    Comment: rare   Drug use: No   Sexual activity: Yes

## 2022-08-30 ENCOUNTER — Encounter: Payer: Self-pay | Admitting: Family

## 2022-08-30 ENCOUNTER — Ambulatory Visit (INDEPENDENT_AMBULATORY_CARE_PROVIDER_SITE_OTHER): Payer: Medicare Other | Admitting: Family

## 2022-08-30 ENCOUNTER — Other Ambulatory Visit: Payer: Self-pay

## 2022-08-30 ENCOUNTER — Ambulatory Visit: Payer: Medicare Other | Admitting: Family

## 2022-08-30 DIAGNOSIS — B353 Tinea pedis: Secondary | ICD-10-CM

## 2022-08-30 MED ORDER — TERBINAFINE HCL 250 MG PO TABS: 250.0000 mg | ORAL_TABLET | Freq: Every day | ORAL | 0 refills | Status: AC

## 2022-08-30 MED ORDER — TERBINAFINE HCL 1 % EX CREA: 1.0000 | TOPICAL_CREAM | Freq: Two times a day (BID) | CUTANEOUS | 0 refills | Status: DC

## 2022-08-30 NOTE — Progress Notes (Signed)
Office Visit Note   Patient: Jonathan Powers           Date of Birth: 1951-01-17           MRN: [id number] Visit Date: 08/30/2022              Requested by: [name], MD [address] [place],  [place] [address] PCP: [name], MD  Chief Complaint  Patient presents with   Left Foot - Wound Check      HPI: The patient is a 72 year old gentleman who presents today for evaluation of bilateral feet left worse than right.  Today he reports that his right foot is no longer giving him any trouble would like [place] to reevaluate the left foot  Has not yet begun using topical or oral Lamisil.  Reports did not recall these prescriptions have been sent to the pharmacy  pain and ulcer in the webspaces.  Reports did use silver cell in the webspaces but has run out    Assessment & Plan: Visit Diagnoses: No diagnosis found.  Plan: Discussed foot care.  Keeping the webspaces dry given some silver cell strips.  discussed possibility of using compression stockings or wall socks to avoid moisture as well.    He will use Lamisil topically discussed getting this over-the-counter.  He will also begin oral Lamisil  Follow-Up Instructions: Return in about 2 weeks (around 09/13/2022).   Ortho Exam  Patient is alert, oriented, no adenopathy, well-dressed, normal affect, normal respiratory effort. On examination bilateral feet and the third and fourth webspaces bilateral feet there is maceration and ulceration left worse than right consistent with fungal infection there is no erythema consistent with cellulitis no warmth no drainage  Imaging: No results found. No images are attached to the encounter.  Labs: Lab Results  Component Value Date   HGBA1C 5.3 09/08/2021   HGBA1C 5.8 02/09/2021   HGBA1C 5.8 06/29/2020   ESRSEDRATE 38 (H) 04/23/2022   ESRSEDRATE 86 (H) 09/11/2021   ESRSEDRATE 9 07/09/2018   CRP 2.3 (H) 04/23/2022   CRP 1.2 09/15/2021   CRP 4.2 (H) 09/11/2021    LABURIC 9.0 (H) 11/28/2019   LABURIC 8.1 (H) 07/09/2018   LABURIC 6.7 02/05/2018   REPTSTATUS 08/13/2022 FINAL 08/08/2022   CULT  08/08/2022    NO GROWTH 5 DAYS Performed at [facility], [address] [place], [place] [address]      Lab Results  Component Value Date   ALBUMIN 4.1 08/08/2022   ALBUMIN 3.5 06/24/2022   ALBUMIN 3.6 04/23/2022    Lab Results  Component Value Date   MG 2.0 09/11/2021   MG 1.9 09/10/2021   MG 1.9 09/08/2021   Lab Results  Component Value Date   VD25OH 55 07/09/2018   VD25OH 54 04/04/2018   VD25OH 46 03/01/2016    No results found for: "PREALBUMIN"    Latest Ref Rng & Units 08/08/2022   10:06 PM 08/02/2022    3:01 PM 06/24/2022    3:51 PM  CBC EXTENDED  WBC 4.0 - 10.5 K/uL 8.4  8.8  6.5   RBC 4.22 - 5.81 MIL/uL 5.45  5.07  5.09   Hemoglobin 13.0 - 17.0 g/dL 69.6  29.5  28.4   HCT 39.0 - 52.0 % 48.7  45.3  45.7   Platelets 150 - 400 K/uL 269  310.0  308   NEUT# 1.7 - 7.7 K/uL 5.8  5.9    Lymph# 0.7 -  4.0 K/uL 1.5  1.7       There is no height or weight on file to calculate BMI.  Orders:  No orders of the defined types were placed in this encounter.  Meds ordered this encounter  Medications   terbinafine (LAMISIL) 250 MG tablet    Sig: Take 1 tablet (250 mg total) by mouth daily.    Dispense:  42 tablet    Refill:  0   terbinafine (LAMISIL AT) 1 % cream    Sig: Apply 1 Application topically 2 (two) times daily.    Dispense:  30 g    Refill:  0     Procedures: No procedures performed  Clinical Data: No additional findings.  ROS:  All other systems negative, except as noted in the HPI. Review of Systems  Objective: Vital Signs: There were no vitals taken for this visit.  Specialty Comments:  No specialty comments available.  PMFS History: Patient Active Problem List   Diagnosis Date Noted   Left flank pain 08/02/2022   Generalized abdominal tenderness with rebound tenderness 08/02/2022   Rash 07/22/2022    Rib fracture 07/22/2022   Chronic fatigue, unspecified    Exocrine pancreatic insufficiency 12/03/2021   Reactive thrombocytosis 09/17/2021   Lung nodule 07/25/2021   Chronic sinusitis 07/10/2021   Acute recurrent pansinusitis 02/17/2021   Deviated septum 02/17/2021   Nasal turbinate hypertrophy 02/17/2021   Amnestic MCI (mild cognitive impairment with memory loss) 10/08/2020   Chronic hyperglycemia 06/29/2020   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   Hypothyroidism 11/11/2018   Cellulitis of left lower extremity 11/10/2018   Insomnia 11/10/2018   GERD (gastroesophageal reflux disease) 11/10/2018   Age-related osteoporosis with current pathological fracture 03/11/2018   Flexural eczema 07/24/2017   Chronic right-sided low back pain without sciatica 07/21/2016   Superior mesenteric artery aneurysm 07/12/2015   Left thyroid nodule    OSA (obstructive sleep apnea) 01/27/2013   Symptomatic PVCs 09/06/2012   BPH (benign prostatic hyperplasia) 09/06/2012   Obesity (BMI 30-39.9) 05/17/2006   Essential hypertension 05/17/2006   Allergic rhinitis 05/17/2006   Primary osteoarthritis 05/17/2006   Past Medical History:  Diagnosis Date   Age-related osteoporosis with current pathological fracture 03/11/2018   Allergic rhinitis 05/17/2006   Amnestic MCI (mild cognitive impairment with memory loss) 10/08/2020   BPH (benign prostatic hyperplasia) 09/06/2012   Cellulitis, right leg    Recurrent R hip cellulitis 1/08,3/08    Chronic fatigue, unspecified    Chronic hyperglycemia 06/29/2020   Chronic right-sided low back pain without sciatica 07/21/2016   Chronic sinusitis 07/10/2021   Diverticulitis    Essential hypertension 05/17/2006   2014     Impression  Exercise Capacity:  Lexiscan with low level exercise.  BP Response:  Normal blood pressure response.  Clinical Symptoms:  No significant symptoms noted.  ECG Impression:  No significant ST segment change suggestive of ischemia.   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   Flexural eczema 07/24/2017   GERD (gastroesophageal reflux disease)    Gout    Hashimoto's thyroiditis    History of skin cancer    Melanoma on back   Hypogonadism male    low T, a/w ED   Hypothyroidism 11/11/2018   Insomnia 11/10/2018   Left thyroid nodule 2008  on [place], decrease size in 2011 [place]   Lung nodule 07/25/2021   Migraines    Atypical and ocular   Obesity (BMI 30-39.9) 05/17/2006   OSA (obstructive sleep apnea) 01/27/2013   HST 01/2013:  AHI 68/hr with obstructive and central events.   09/2015 compliance report> > 4 hours 80% of days, used 25/30 days   Primary osteoarthritis 05/17/2006   Reactive thrombocytosis 09/17/2021   Recurrent genital HSV (herpes simplex virus) infection 11/12/2018   Superior mesenteric artery aneurysm 07/12/2015   DEC 2019     IMPRESSION:  VASCULAR     1. No acute findings.  2. 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  3. Ectatic abdominal aorta at risk for aneurysm development.  Recommend followup by ultrasound in 5 years. This recommendation  follows ACR consensus guidelines: White Paper of the ACR Incidental  Findings Committee II on Vascular Findings. J Am Coll Radiol 2013;  10:789-794.  4. 1.   Symptomatic PVCs 09/06/2012    Family History  Problem Relation Age of Onset   Arthritis Father    Heart disease Father    Diabetes Father    Vascular Disease Father        AoBifem   Arthritis Mother    Diabetes Mother    Multiple sclerosis Mother    Cancer Paternal Grandmother        "GI"   Cancer Paternal Grandfather        stomach cancer   Thyroid cancer Sister    Uterine cancer Sister     Past Surgical History:  Procedure Laterality Date   CARPAL TUNNEL RELEASE Left 03/30/2017   Procedure:  CARPAL TUNNEL RELEASE;  Surgeon: Valeria Batman, MD;  Location: [facility];  Service: Orthopedics;  Laterality: Left;   CHOLECYSTECTOMY  2004   CTR Bilateral 1982   CYSTOSCOPY  1974   FOOT FUSION Left 06/22/2017   HEMORRHOID SURGERY N/A 03/30/2017   Procedure: HEMORRHOIDECTOMY;  Surgeon: Abigail Miyamoto, MD;  Location: [facility];  Service: General;  Laterality: N/A;   open reduction L little finger Left 1970   Repair digital thumb, left Left 1990   Right shoulder cuff repair Right 09/15/11   Right shoulder SAD, DCR Right 02/05/11   TOTAL HIP ARTHROPLASTY Left 07/26/06   TOTAL HIP ARTHROPLASTY Right 1999   TOTAL HIP REVISION Left 03/07/2019   Procedure: LEFT TOTAL HIP REVISION POSTERIOR APPROACH;  Surgeon: Gean Birchwood, MD;  Location: [facility];  Service: Orthopedics;  Laterality: Left;   Social History   Occupational History   Occupation: Orthopedic PA    Employer: [employer]  Tobacco Use   Smoking status: Never   Smokeless tobacco: Never  Vaping Use   Vaping Use: Never used  Substance and Sexual Activity   Alcohol use: Yes    Alcohol/week: 0.0 standard drinks of alcohol    Comment: rare   Drug use: No   Sexual activity: Yes

## 2022-09-05 ENCOUNTER — Other Ambulatory Visit: Payer: Self-pay | Admitting: Internal Medicine

## 2022-09-05 DIAGNOSIS — G4730 Sleep apnea, unspecified: Secondary | ICD-10-CM

## 2022-09-12 DIAGNOSIS — H43811 Vitreous degeneration, right eye: Secondary | ICD-10-CM | POA: Diagnosis not present

## 2022-09-12 DIAGNOSIS — H2513 Age-related nuclear cataract, bilateral: Secondary | ICD-10-CM | POA: Diagnosis not present

## 2022-09-19 ENCOUNTER — Encounter: Payer: Self-pay | Admitting: Psychology

## 2022-09-19 ENCOUNTER — Ambulatory Visit: Payer: Medicare Other | Admitting: Internal Medicine

## 2022-09-19 DIAGNOSIS — L501 Idiopathic urticaria: Secondary | ICD-10-CM | POA: Insufficient documentation

## 2022-09-20 ENCOUNTER — Ambulatory Visit: Payer: Medicare Other

## 2022-09-20 ENCOUNTER — Encounter: Payer: Self-pay | Admitting: Psychology

## 2022-09-20 ENCOUNTER — Ambulatory Visit (INDEPENDENT_AMBULATORY_CARE_PROVIDER_SITE_OTHER): Payer: Medicare Other | Admitting: Psychology

## 2022-09-20 DIAGNOSIS — F0670 Mild neurocognitive disorder due to known physiological condition without behavioral disturbance: Secondary | ICD-10-CM

## 2022-09-20 DIAGNOSIS — R4189 Other symptoms and signs involving cognitive functions and awareness: Secondary | ICD-10-CM

## 2022-09-20 DIAGNOSIS — G309 Alzheimer's disease, unspecified: Secondary | ICD-10-CM | POA: Diagnosis not present

## 2022-09-20 HISTORY — DX: Mild neurocognitive disorder due to known physiological condition without behavioral disturbance: F06.70

## 2022-09-20 NOTE — Progress Notes (Addendum)
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 average, semantic fluency was exceptionally low to below average, and confrontation naming was exceptionally low.     Assessed visuospatial/visuoconstructional abilities were average outside of a well below average performance across a visual discrimination task.    Learning (i.e., encoding) of novel information was average across a shape learning task but exceptionally low across all verbal tasks. Spontaneous delayed recall (i.e., retrieval) of previously learned information was commensurate with performances across learning trials. Retention rates were 0% across a story learning task, 0% across a list learning task, 0% across a daily living task, and 83% across a shape learning task. Performance across recognition tasks was commensurate with performances across delayed retrieval tasks, suggesting limited evidence for information consolidation.   Results of emotional screening instruments suggested that recent symptoms of generalized anxiety were in the moderate  range, while symptoms of depression were within normal limits. A screening instrument assessing recent sleep quality suggested the presence of minimal sleep dysfunction.  Tables of Scores:   Note: This summary of test scores accompanies the interpretive report and should not be considered in isolation without reference to the appropriate sections in the text. Descriptors are based on appropriate normative data and may be adjusted based on clinical judgment. Terms such as "Within Normal Limits" and "Outside Normal Limits" are used when a more specific description of the test score cannot be determined. Descriptors refer to the current evaluation only.        Percentile - Normative Descriptor > 98 - Exceptionally High 91-97 - Well Above Average 75-90 - Above Average 25-74 - Average 9-24 - Below Average 2-8 - Well Below Average < 2 - Exceptionally Low        Validity: August 2022 Current  DESCRIPTOR        DCT: --- --- --- Within Normal Limits  NAB EVI: --- --- --- Within Normal Limits        Orientation:       Raw Score Raw Score Percentile   NAB Orientation, Form 1 --- 23/29 --- ---        Cognitive Screening:       Raw Score Raw Score Percentile   SLUMS: --- 12/30 --- ---        Intellectual Functioning:       Standard Score  Standard Score Percentile   Test of Premorbid Functioning: --- 96 39 Average        Memory:      NAB Memory Module, Form 1: Standard Score/ T Score Standard Score/ T Score Percentile   Total Memory Index 69 58 <1 Exceptionally Low  List Learning        Total Trials 1-3 16/36 (32) 8/36 (19) <1 Exceptionally Low    List B 4/12 (47) 1/12 (26) 1 Exceptionally Low    Short Delay Free Recall 2/12 (23) 0/12 (19) <1 Exceptionally Low    Long Delay Free Recall 0/12 (21) 0/12 (23) <1 Exceptionally Low    Retention Percentage 0 0 (22) <1 Exceptionally Low    Recognition Discriminability 2 -3 (21) <1 Exceptionally Low  Shape Learning        Total Trials 1-3 20/27  (61) 14/27 (46) 34 Average    Delayed Recall 8/9 (67) 5/9 (46) 34 Average    Retention Percentage 133 83 (45) 31 Average    Recognition Discriminability 5 6 (47) 38 Average  Story Learning        Immediate Recall 38/80 (31) 16/80 (19) <1 Exceptionally Low    Delayed Recall 14/40 (30) 0/40 (28) 2 Well Below Average    Retention Percentage 61 0 (<19) <1 Exceptionally Low  Daily Living Memory        Immediate Recall 25/51 (25) 21/51 (21) <1 Exceptionally Low    Delayed Recall 0/17 (19) 0/17 (19) <1 Exceptionally Low    Retention Percentage 0 0 (<19) <1 Exceptionally Low    Recognition Hits 7/10 5/10 (20) <1 Exceptionally Low        Attention/Executive Function:      Trail Making Test (TMT): T Score Raw Score (T Score) Percentile     Part A 58 45 secs.,  0 errors (37) 9 Below Average    Part B 50 114 secs.,  0 errors (40) 16 Below Average          Scaled Score Scaled Score Percentile   WAIS-IV Coding: --- 9 37 Average        NAB Attention Module, Form 1: T Score T Score Percentile     Digits Forward 49 44 27 Average    Digits Backwards 45 40 16 Below Average        D-KEFS Verbal Fluency Test: Raw Score (Scaled Score) Raw Score (Scaled Score) Percentile     Letter Total Correct --- 31 (9) 37 Average    Category Total Correct --- 24 (6) 9 Below Average    Category Switching Total Correct --- 5 (2) <1 Exceptionally Low    Category Switching Accuracy --- 2 (1) <1 Exceptionally Low      Total Set Loss Errors --- 9 (1) <1 Exceptionally Low      Total Repetition Errors --- 9 (4) 2 Well Below Average        Language:      Verbal Fluency Test: T Score Raw Score (T Score) Percentile     Phonemic Fluency (FAS) 39 31 (39) 14 Below Average    Animal Fluency 31 7 (<19) <1 Exceptionally Low         NAB Language Module, Form 1: T Score T Score Percentile     Auditory Comprehension --- 56 73 Average    Naming 31/31 (55) 25/31 (24) <1 Exceptionally Low         Visuospatial/Visuoconstruction:       Raw Score Raw Score Percentile  Clock Drawing: 10/10 8/10 --- Within Normal Limits        NAB Spatial Module, Form 1: T Score T Score Percentile     Visual Discrimination 59 32 4 Well Below Average    Figure Drawing Copy 60 53 62 Average         Scaled Score Scaled Score Percentile   WAIS-IV Block Design: --- 8 25 Average        Mood and Personality:       Raw Score Raw Score Percentile   Beck Depression Inventory - II: 19 10 --- Within Normal Limits  PROMIS Anxiety Questionnaire: --- 22 --- Moderate        Additional Questionnaires:       Raw Score Raw Score Percentile   PROMIS Sleep Disturbance Questionnaire: --- 21 --- None to Slight   Informed Consent and Coding/Compliance:   The current evaluation represents a clinical evaluation for the purposes previously outlined by the referral source and is in no way reflective of a forensic evaluation.   Mr. Galentine was provided with a verbal description of the nature and purpose of the present neuropsychological evaluation. Also reviewed were the foreseeable risks and/or discomforts and benefits of the procedure, limits of confidentiality, and mandatory reporting requirements of this provider. The patient was given the opportunity to ask questions and receive answers about the evaluation. Oral consent to participate was provided by the patient.   This evaluation was conducted by Newman Nickels, Ph.D., ABPP-CN, board certified clinical neuropsychologist. Mr. Carbajal completed a clinical interview with Dr. Milbert Coulter, billed as one unit [phone], and 130 minutes of cognitive testing and scoring, billed as one unit [phone] and three additional units [id number]. Psychometrist Shan Levans, B.S. assisted Dr. Milbert Coulter with test administration and scoring procedures. As a separate and discrete service, one unit [id number] and two units [phone] were billed for Dr. [name] Sours time spent in interpretation and report writing.

## 2022-09-20 NOTE — Progress Notes (Signed)
   Psychometrician Note   Cognitive testing was administered to Jonathan Powers by Shan Levans, B.S. (psychometrist) under the supervision of Dr. Newman Nickels, Ph.D., licensed psychologist on 09/20/2022. Mr. Jonathan Powers did not appear overtly distressed by the testing session per behavioral observation or responses across self-report questionnaires. Rest breaks were offered.    The battery of tests administered was selected by Dr. Newman Nickels, Ph.D. with consideration to Mr. Jonathan Powers's current level of functioning, the nature of his symptoms, emotional and behavioral responses during interview, level of literacy, observed level of motivation/effort, and the nature of the referral question. This battery was communicated to the psychometrist. Communication between Dr. Newman Nickels, Ph.D. and the psychometrist was ongoing throughout the evaluation and Dr. Newman Nickels, Ph.D. was immediately accessible at all times. Dr. Newman Nickels, Ph.D. provided supervision to the psychometrist on the date of this service to the extent necessary to assure the quality of all services provided.    Jonathan Powers will return within approximately 1-2 weeks for an interactive feedback session with Dr. Milbert Coulter at which time his test performances, clinical impressions, and treatment recommendations will be reviewed in detail. Mr. Jonathan Powers understands he can contact our office should he require our assistance before this time.  A total of 130 minutes of billable time were spent face-to-face with Mr. Jonathan Powers by the psychometrist. This includes both test administration and scoring time. Billing for these services is reflected in the clinical report generated by Dr. Newman Nickels, Ph.D.  This note reflects time spent with the psychometrician and does not include test scores or any clinical interpretations made by Dr. Milbert Coulter. The full report will follow in a separate note.

## 2022-09-21 DIAGNOSIS — H16121 Filamentary keratitis, right eye: Secondary | ICD-10-CM | POA: Diagnosis not present

## 2022-09-21 DIAGNOSIS — H04123 Dry eye syndrome of bilateral lacrimal glands: Secondary | ICD-10-CM | POA: Diagnosis not present

## 2022-09-25 ENCOUNTER — Telehealth: Payer: Self-pay | Admitting: Orthopedic Surgery

## 2022-09-25 DIAGNOSIS — H16121 Filamentary keratitis, right eye: Secondary | ICD-10-CM | POA: Diagnosis not present

## 2022-09-25 NOTE — Telephone Encounter (Signed)
Patient called wanting to know if the handicap sticker is ready. CB#[phone]

## 2022-09-25 NOTE — Telephone Encounter (Signed)
In signature fold will sign and call tomorrow morning once complete.

## 2022-09-26 ENCOUNTER — Ambulatory Visit
Admission: RE | Admit: 2022-09-26 | Discharge: 2022-09-26 | Disposition: A | Discharge status: Home / Self Care | Payer: Medicare Other | Source: Ambulatory Visit | Attending: Family | Admitting: Family

## 2022-09-26 ENCOUNTER — Other Ambulatory Visit: Payer: Self-pay

## 2022-09-26 ENCOUNTER — Encounter: Payer: Self-pay | Admitting: Family

## 2022-09-26 ENCOUNTER — Ambulatory Visit (INDEPENDENT_AMBULATORY_CARE_PROVIDER_SITE_OTHER): Payer: Medicare Other | Admitting: Family

## 2022-09-26 VITALS — BP 123/79 | HR 64 | Temp 97.6°F | Resp 16 | Wt 245.2 lb

## 2022-09-26 DIAGNOSIS — L03116 Cellulitis of left lower limb: Secondary | ICD-10-CM

## 2022-09-26 MED ORDER — CEFADROXIL 500 MG PO CAPS: 500.0000 mg | ORAL_CAPSULE | Freq: Two times a day (BID) | ORAL | 1 refills | Status: DC

## 2022-09-26 NOTE — Progress Notes (Unsigned)
Subjective:    Patient ID: Jonathan Powers, male    DOB: [birth year], 72 y.o.   MRN: [id number]  Chief Complaint  Patient presents with   New Patient (Initial Visit)    Cellulitis - LLE     HPI:  Jonathan Powers is a 72 y.o. male with recurrent cellulitis last seen by [name], NP on 05/01/22 after being seen in the ED for cellulitis. No additional antibiotics were needed at the time with recommendations for extremity elevation and compression. Was given an as needed course of Cefadroxil if symptoms returned. Most recently seen in the ED on 08/08/22 for cellulitis and treated with Dalbavancin. Here today for ED follow up.   [name] is here today with his daughter. Swelling and edema has improved since administration of Dalvance and has a nodule/bump located on the anterior tibia. Leg swelling has improved. No fevers or chills.   Allergies  Allergen Reactions   Nsaids Shortness Of Breath    Naproxen specifically causes SOB/wheeze tolerates topical diclofenac without problem Tylenol OK   Tolmetin Shortness Of Breath    Naproxen specifically causes SOB/wheeze tolerates topical diclofenac without problem Tylenol OK      Outpatient Medications Prior to Visit  Medication Sig Dispense Refill   acetaminophen (TYLENOL) 500 MG tablet Take 1,000 mg by mouth every 6 (six) hours as needed for moderate pain.      cetirizine (ZYRTEC) 10 MG tablet Take 10 mg by mouth daily as needed for allergies.     fluticasone (FLONASE) 50 MCG/ACT nasal spray Place 1 spray into both nostrils daily as needed for allergies.     ketoconazole (NIZORAL) 2 % cream Apply 1 Application topically daily as needed for irritation (rash).     levothyroxine (SYNTHROID) 175 MCG tablet TAKE 1 TABLET BY MOUTH ONCE DAILY BEFORE BREAKFAST 90 tablet 0   olmesartan-hydrochlorothiazide (BENICAR HCT) 40-25 MG tablet TAKE 1 TABLET BY MOUTH ONCE DAILY AS DIRECTED 90 tablet 0   terbinafine (LAMISIL AT) 1 % cream Apply 1  Application topically 2 (two) times daily. 30 g 0   albuterol (VENTOLIN HFA) 108 (90 Base) MCG/ACT inhaler Inhale 1-2 puffs into the lungs every 6 (six) hours as needed for wheezing or shortness of breath. (Patient not taking: Reported on 09/26/2022) 18 g 0   esomeprazole (NEXIUM) 40 MG capsule Take 1 capsule (40 mg total) by mouth daily. (Patient not taking: Reported on 09/26/2022) 90 capsule 1   fluconazole (DIFLUCAN) 100 MG tablet Take 1 tablet (100 mg total) by mouth daily. (Patient not taking: Reported on 09/26/2022) 7 tablet 0   memantine (NAMENDA) 5 MG tablet Take 1 tablet (5 mg at night) for 2 weeks, then increase to 1 tablet (5 mg) twice a day (Patient not taking: Reported on 09/26/2022) 60 tablet 11   Pancrelipase, Lip-Prot-Amyl, (ZENPEP) 25000-79000 units CPEP Take 1 capsule by mouth 3 (three) times daily with meals. (Patient not taking: Reported on 09/26/2022) 270 capsule 1   rosuvastatin (CRESTOR) 5 MG tablet Take 1 tablet (5 mg total) by mouth daily. (Patient not taking: Reported on 09/26/2022) 90 tablet 1   tadalafil (CIALIS) 5 MG tablet Take 1 tablet (5 mg total) by mouth daily as needed for erectile dysfunction. (Patient not taking: Reported on 09/26/2022) 10 tablet 2   terbinafine (LAMISIL) 250 MG tablet Take 1 tablet (250 mg total) by mouth daily. (Patient not taking: Reported on 09/26/2022) 42 tablet 0   Testosterone 1.62 % GEL  (Patient not taking:  Reported on 09/26/2022)     traMADol (ULTRAM) 50 MG tablet 1-2 tablets every 6 hours as needed for pain (Patient not taking: Reported on 09/26/2022) 15 tablet 0   traZODone (DESYREL) 100 MG tablet TAKE 1 TABLET BY MOUTH AT BEDTIME AS NEEDED FOR SLEEP (Patient not taking: Reported on 09/26/2022) 90 tablet 0   No facility-administered medications prior to visit.     Past Medical History:  Diagnosis Date   Acute recurrent pansinusitis 02/17/2021   Age related osteoporosis 03/11/2018   Allergic rhinitis 05/17/2006   BPH (benign prostatic hyperplasia)  09/06/2012   Cellulitis of left lower extremity 11/10/2018   Cellulitis, right leg    Recurrent R hip cellulitis 1/08,3/08    Chronic fatigue, unspecified    Chronic hyperglycemia 06/29/2020   Chronic right-sided low back pain without sciatica 07/21/2016   Chronic sinusitis 07/10/2021   Deviated septum 02/17/2021   Diverticulitis    Essential hypertension 05/17/2006   2014     Impression  Exercise Capacity:  Lexiscan with low level exercise.  BP Response:  Normal blood pressure response.  Clinical Symptoms:  No significant symptoms noted.  ECG Impression:  No significant ST segment change suggestive of ischemia.  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   Flexural eczema 07/24/2017   Generalized abdominal tenderness with rebound tenderness 08/02/2022   GERD (gastroesophageal reflux disease)    Gout    Hashimoto's thyroiditis    History of skin cancer    Melanoma on back   Hypogonadism male    low T, a/w ED   Hypothyroidism 11/11/2018   Idiopathic urticaria    Insomnia 11/10/2018   Left flank pain 08/02/2022   Left thyroid nodule 2008   on [place], decrease size in 2011 [place]   Lung nodule 07/25/2021   Migraines    Atypical and ocular   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   09/2015 compliance report> > 4 hours 80% of days, used 25/30 days   Primary osteoarthritis 05/17/2006   Rash 07/22/2022   Reactive thrombocytosis 09/17/2021   Recurrent genital HSV (herpes simplex virus) infection 11/12/2018   Rib fracture 07/22/2022   Superior mesenteric artery aneurysm 07/12/2015   DEC 2019      IMPRESSION:  VASCULAR     1. No acute findings.  2. 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  3. Ectatic abdominal aorta at risk for aneurysm development.  Recommend followup by ultrasound in 5 years. This recommendation  follows ACR consensus guidelines: White Paper of the ACR Incidental  Findings Committee II on Vascular Findings. J Am Coll Radiol 2013;  [phone].  4. 1.   Symptomatic PVCs 09/06/2012     Past Surgical History:  Procedure Laterality Date   CARPAL TUNNEL RELEASE Left 03/30/2017   Procedure: CARPAL TUNNEL RELEASE;  Surgeon: Valeria Batman, MD;  Location: [facility];  Service: Orthopedics;  Laterality: Left;   CHOLECYSTECTOMY  2004   CTR Bilateral 1982   CYSTOSCOPY  1974   FOOT FUSION Left 06/22/2017   HEMORRHOID SURGERY N/A 03/30/2017   Procedure: HEMORRHOIDECTOMY;  Surgeon:  Abigail Miyamoto, MD;  Location: [facility];  Service: General;  Laterality: N/A;   open reduction L little finger Left 1970   Repair digital thumb, left Left 1990   Right shoulder cuff repair Right 09/15/11   Right shoulder SAD, DCR Right 02/05/11   TOTAL HIP ARTHROPLASTY Left 07/26/06   TOTAL HIP ARTHROPLASTY Right 1999   TOTAL HIP REVISION Left 03/07/2019   Procedure: LEFT TOTAL HIP REVISION POSTERIOR APPROACH;  Surgeon: Gean Birchwood, MD;  Location: [facility];  Service: Orthopedics;  Laterality: Left;       Review of Systems  Constitutional:  Negative for chills, diaphoresis, fatigue and fever.  Respiratory:  Negative for cough, chest tightness, shortness of breath and wheezing.   Cardiovascular:  Negative for chest pain.  Gastrointestinal:  Negative for abdominal pain, diarrhea, nausea and vomiting.      Objective:    BP 123/79   Pulse 64   Temp 97.6 F (36.4 C) (Temporal)   Resp 16   Wt 245 lb 3.2 oz (111.2 kg)   SpO2 96%   BMI 34.20 kg/m  Nursing note and vital signs reviewed.  Physical Exam Constitutional:      General: He is not in  acute distress.    Appearance: He is well-developed.  Cardiovascular:     Rate and Rhythm: Normal rate and regular rhythm.     Heart sounds: Normal heart sounds.  Pulmonary:     Effort: Pulmonary effort is normal.     Breath sounds: Normal breath sounds.  Musculoskeletal:     Comments: Mild edema of left lower extremity with hard lesion present anterior tibia.   Skin:    General: Skin is warm and dry.  Neurological:     Mental Status: He is alert and oriented to person, place, and time.         09/26/2022    3:00 PM 07/20/2022    2:39 PM 05/01/2022    9:49 AM 10/11/2021    3:37 PM 09/15/2021    2:25 PM  Depression screen PHQ 2/9  Decreased Interest 0 0 0 0 1  Down, Depressed, Hopeless 0 0 0 0 0  PHQ - 2 Score 0 0 0 0 1  Altered sleeping  0  0   Tired, decreased energy  0  0   Change in appetite  0  0   Feeling bad or failure about yourself   0  0   Trouble concentrating  0  0   Moving slowly or fidgety/restless  0  0   Suicidal thoughts  0  0   PHQ-9 Score  0  0   Difficult doing work/chores  Not difficult at all  Not difficult at all        Assessment & Plan:    Patient Active Problem List   Diagnosis Date Noted   Mild neurocognitive disorder due to Alzheimer's disease 09/20/2022   Idiopathic urticaria    Left flank pain 08/02/2022   Generalized abdominal tenderness with rebound tenderness 08/02/2022   Chronic fatigue, unspecified    Exocrine pancreatic insufficiency 12/03/2021   Reactive thrombocytosis 09/17/2021   Lung nodule 07/25/2021   Chronic sinusitis 07/10/2021   Acute recurrent pansinusitis 02/17/2021   Deviated septum 02/17/2021   Nasal turbinate hypertrophy 02/17/2021   Chronic hyperglycemia 06/29/2020   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   Hypothyroidism 11/11/2018   Cellulitis of left lower extremity 11/10/2018   Insomnia 11/10/2018   GERD (gastroesophageal reflux disease) 11/10/2018  Age related osteoporosis 03/11/2018   Flexural eczema  07/24/2017   Chronic right-sided low back pain without sciatica 07/21/2016   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 Loor is s/p Dalavance infusion in the ED for cellulitis of the left lower extremity. Does not appear to have taken previously prescribed cefadroxil. No residual signs of infection or cellulitis remain with mild amount of edema a lesion located on the anterior tibia that does not seem consistent with infection. Will get x-ray to check for possible osteophyte. Discussed nature of cellulitis and common pathogens. Will provide a prescription for Cefadroxil to use in the event of recurrent concern for cellulitis. Encouraged to use compression socks and elevate leg when able. Follow up with ID as needed.       Relevant Orders   DG Tibia/Fibula Left     I am having Tylyn Blaustein. Fishel start on cefadroxil. I am also having him maintain his cetirizine, acetaminophen, esomeprazole, fluticasone, ketoconazole, rosuvastatin, tadalafil, levothyroxine, memantine, Testosterone, albuterol, traMADol, fluconazole, Zenpep, olmesartan-hydrochlorothiazide, terbinafine, terbinafine, and traZODone.   Meds ordered this encounter  Medications   cefadroxil (DURICEF) 500 MG capsule    Sig: Take 1 capsule (500 mg total) by mouth 2 (two) times daily.    Dispense:  20 capsule    Refill:  1    Order Specific Question:   Supervising Provider    Answer:   Judyann Munson [[id number]]     Follow-up: As needed.    Marcos Eke, MSN, FNP-C Nurse Practitioner [facility] Main number: [phone]

## 2022-09-26 NOTE — Patient Instructions (Signed)
Nice to see you.  Elevate your leg when seated.  We will check the x-ray.   Use the cefadroxil as needed for flare of cellulitis  Follow up with ID as needed.   Have a great day and stay safe!

## 2022-09-27 ENCOUNTER — Encounter: Payer: Self-pay | Admitting: Family

## 2022-09-27 ENCOUNTER — Ambulatory Visit (INDEPENDENT_AMBULATORY_CARE_PROVIDER_SITE_OTHER): Payer: Medicare Other | Admitting: Psychology

## 2022-09-27 DIAGNOSIS — F0670 Mild neurocognitive disorder due to known physiological condition without behavioral disturbance: Secondary | ICD-10-CM | POA: Diagnosis not present

## 2022-09-27 DIAGNOSIS — G309 Alzheimer's disease, unspecified: Secondary | ICD-10-CM | POA: Diagnosis not present

## 2022-09-27 NOTE — Assessment & Plan Note (Signed)
Jonathan Powers is s/p Dalavance infusion in the ED for cellulitis of the left lower extremity. Does not appear to have taken previously prescribed cefadroxil. No residual signs of infection or cellulitis remain with mild amount of edema a lesion located on the anterior tibia that does not seem consistent with infection. Will get x-ray to check for possible osteophyte. Discussed nature of cellulitis and common pathogens. Will provide a prescription for Cefadroxil to use in the event of recurrent concern for cellulitis. Encouraged to use compression socks and elevate leg when able. Follow up with ID as needed.

## 2022-09-27 NOTE — Progress Notes (Signed)
   Neuropsychology Feedback Session [name]. [facility] Department of Neurology  Reason for Referral:   Jonathan Powers is a 72 y.o. right-handed Caucasian male referred by Marlowe Kays, PA-C, to characterize his current cognitive functioning and assist with diagnostic clarity and treatment planning in the context of a prior amnestic mild cognitive impairment diagnosis and concerns for progressive cognitive decline.   Feedback:   Jonathan Powers completed a comprehensive neuropsychological evaluation on 09/20/2022. Please refer to that encounter for the full report and recommendations. Briefly, results suggested severe impairment surrounding all aspects of verbal learning and memory. Additional impairments were exhibited across cognitive flexibility, semantic fluency, and confrontation naming. Relative to performances across his August 2022 evaluation, mild to moderate decline was exhibited across cognitive flexibility, semantic fluency, confrontation naming, and various aspects of memory. The latter includes visual memory where, despite current performances still being normatively appropriate, he did exhibit decline from a previously higher degree of functioning. Dr. Roseanne Reno previously expressed concerns surrounding a neurodegenerative illness at the time of his 2022 evaluation. I unfortunately agree with these concerns, namely surrounding the earlier stages of Alzheimer's disease. Jonathan Powers was fully amnestic (i.e., 0% retention) across all verbal memory tasks after a brief delay and consistently performed poorly across yes/no recognition trials. Taken together, this suggests rapid forgetting and a profound storage impairment, both of which are the hallmark testing patterns of this illness. Further impairment surrounding semantic fluency and confrontation naming follow typical disease trajectory. This and evidence for progressive objective decline further strengthens concerns for  this illness being present.  Jonathan Powers was accompanied by his daughter during the current feedback session. Content of the current session focused on the results of his neuropsychological evaluation. Jonathan Powers was given the opportunity to ask questions and his questions were answered. He was encouraged to reach out should additional questions arise. A copy of his report was provided at the conclusion of the visit.      One unit [phone] was billed for Dr. [name] Sours time spent preparing for, conducting, and documenting the current feedback session with Jonathan Powers.

## 2022-10-02 ENCOUNTER — Ambulatory Visit (INDEPENDENT_AMBULATORY_CARE_PROVIDER_SITE_OTHER): Payer: Medicare Other | Admitting: Orthopedic Surgery

## 2022-10-02 DIAGNOSIS — L309 Dermatitis, unspecified: Secondary | ICD-10-CM

## 2022-10-02 DIAGNOSIS — B353 Tinea pedis: Secondary | ICD-10-CM

## 2022-10-03 ENCOUNTER — Encounter: Payer: Self-pay | Admitting: Orthopedic Surgery

## 2022-10-03 NOTE — Progress Notes (Signed)
Patient is seen in follow-up for athletes feet fungus between the toes both feet.  Examination he has macerated skin there is no cellulitis no full-thickness skin defect.  He was given Vive socks to wrap between the toes recommended using over-the-counter antifungal powder.  Follow-up as needed.

## 2022-10-17 ENCOUNTER — Other Ambulatory Visit: Payer: Self-pay

## 2022-10-17 MED ORDER — DOXYCYCLINE HYCLATE 100 MG PO TABS: 100.0000 mg | ORAL_TABLET | Freq: Two times a day (BID) | ORAL | 0 refills | Status: AC

## 2022-10-20 ENCOUNTER — Encounter: Payer: Self-pay | Admitting: Nurse Practitioner

## 2022-10-20 ENCOUNTER — Ambulatory Visit (INDEPENDENT_AMBULATORY_CARE_PROVIDER_SITE_OTHER): Payer: Medicare Other | Admitting: Nurse Practitioner

## 2022-10-20 VITALS — BP 130/72 | HR 65 | Temp 97.1°F | Ht 71.0 in | Wt 251.4 lb

## 2022-10-20 DIAGNOSIS — Z23 Encounter for immunization: Secondary | ICD-10-CM | POA: Diagnosis not present

## 2022-10-20 DIAGNOSIS — R21 Rash and other nonspecific skin eruption: Secondary | ICD-10-CM | POA: Diagnosis not present

## 2022-10-20 DIAGNOSIS — R1012 Left upper quadrant pain: Secondary | ICD-10-CM | POA: Insufficient documentation

## 2022-10-20 MED ORDER — FLUCONAZOLE 150 MG PO TABS: 150.0000 mg | ORAL_TABLET | ORAL | 0 refills | Status: DC

## 2022-10-20 NOTE — Patient Instructions (Signed)
It was great to see you!  Start miralax once a day to help with your bowels. Keep drinking plenty of water.   Start diflucan for the rash. Take one weekly for 4 weeks.   Let's follow-up if symptoms worsen or don't improve.   Take care,  Rodman Pickle, NP

## 2022-10-20 NOTE — Assessment & Plan Note (Signed)
LUQ pain with constipation for 2 weeks. Recent labs reviewed, has history of cholecystectomy. Most likely related to constipation. Start miralax over the counter daily. Encourage fluids. Follow-up if symptoms worsen or don't improve.

## 2022-10-20 NOTE — Progress Notes (Signed)
Acute Office Visit  Subjective:     Patient ID: Jonathan Powers, male    DOB: 06/29/1950, 72 y.o.   MRN: [id number]  Chief Complaint  Patient presents with   Constipation    With LUQ pain intermittent for 2 weeks, rash along waist, appetitie loss    HPI Patient is in today for intermittent left upper and right upper quadrant pain, and rash.  He has been having intermittent left upper and right upper quadrant pain for 2 weeks.  He states that he has also been having some constipation.  He notes that the pain will sometimes radiate to his back, however not always.  He denies fevers, nausea, vomiting.  He states that he had to manually disimpact himself the other day to help with his stool.  He has not tried anything for stool softeners.  He also notes that he has a rash along his groin and inner thighs.  He states that this has been going on for a few weeks.  He had this a few months ago however it got better and then it got worse again.  He has been washing and keeping the area dry and using a jock itch medication that burns.  ROS See pertinent positives and negatives per HPI.     Objective:    BP 130/72 (BP Location: Left Arm)   Pulse 65   Temp (!) 97.1 F (36.2 C)   Ht 5\' 11"  (1.803 m)   Wt 251 lb 6.4 oz (114 kg)   SpO2 96%   BMI 35.06 kg/m    Physical Exam Vitals and nursing note reviewed.  Constitutional:      Appearance: Normal appearance.  HENT:     Head: Normocephalic.  Eyes:     Conjunctiva/sclera: Conjunctivae normal.  Cardiovascular:     Rate and Rhythm: Normal rate and regular rhythm.     Pulses: Normal pulses.     Heart sounds: Normal heart sounds.  Pulmonary:     Effort: Pulmonary effort is normal.     Breath sounds: Normal breath sounds.  Abdominal:     General: There is no distension.     Palpations: Abdomen is soft. There is no mass.     Tenderness: There is abdominal tenderness (LUQ). There is no guarding or rebound.     Hernia: No hernia is  present.  Musculoskeletal:     Cervical back: Normal range of motion.  Skin:    General: Skin is warm.     Findings: Rash (inner thighs, groin) present.  Neurological:     General: No focal deficit present.     Mental Status: He is alert and oriented to person, place, and time.  Psychiatric:        Mood and Affect: Mood normal.        Behavior: Behavior normal.        Thought Content: Thought content normal.        Judgment: Judgment normal.      Assessment & Plan:   Problem List Items Addressed This Visit       Other   LUQ pain - Primary    LUQ pain with constipation for 2 weeks. Recent labs reviewed, has history of cholecystectomy. Most likely related to constipation. Start miralax over the counter daily. Encourage fluids. Follow-up if symptoms worsen or don't improve.       Other Visit Diagnoses     Rash       Appears fungal around groin,  has been on antibiotics recently. Cont. to wash and keep area dry and clean. Start diflucan 150mg  weekly x4 weeks   Immunization due       Flu shot given today   Relevant Orders   Flu Vaccine Trivalent High Dose (Fluad) (Completed)       Meds ordered this encounter  Medications   fluconazole (DIFLUCAN) 150 MG tablet    Sig: Take 1 tablet (150 mg total) by mouth once a week.    Dispense:  4 tablet    Refill:  0    Return if symptoms worsen or fail to improve.  Gerre Scull, NP

## 2022-10-21 ENCOUNTER — Other Ambulatory Visit: Payer: Self-pay

## 2022-10-21 ENCOUNTER — Emergency Department (HOSPITAL_COMMUNITY)
Admission: EM | Admit: 2022-10-21 | Discharge: 2022-10-22 | Discharge status: Against Medical Advice | Payer: Medicare Other | Attending: Emergency Medicine | Admitting: Emergency Medicine

## 2022-10-21 ENCOUNTER — Encounter (HOSPITAL_COMMUNITY): Payer: Self-pay | Admitting: *Deleted

## 2022-10-21 DIAGNOSIS — R109 Unspecified abdominal pain: Secondary | ICD-10-CM | POA: Insufficient documentation

## 2022-10-21 DIAGNOSIS — Z7901 Long term (current) use of anticoagulants: Secondary | ICD-10-CM | POA: Diagnosis not present

## 2022-10-21 DIAGNOSIS — R1010 Upper abdominal pain, unspecified: Secondary | ICD-10-CM | POA: Diagnosis not present

## 2022-10-21 DIAGNOSIS — R21 Rash and other nonspecific skin eruption: Secondary | ICD-10-CM | POA: Diagnosis not present

## 2022-10-21 DIAGNOSIS — R1012 Left upper quadrant pain: Secondary | ICD-10-CM | POA: Insufficient documentation

## 2022-10-21 DIAGNOSIS — Z5321 Procedure and treatment not carried out due to patient leaving prior to being seen by health care provider: Secondary | ICD-10-CM | POA: Insufficient documentation

## 2022-10-21 DIAGNOSIS — R197 Diarrhea, unspecified: Secondary | ICD-10-CM | POA: Diagnosis not present

## 2022-10-21 NOTE — ED Triage Notes (Signed)
The pt has had abd pain for 24 hours with diarrhea   no temp

## 2022-10-22 ENCOUNTER — Encounter (HOSPITAL_COMMUNITY): Payer: Self-pay

## 2022-10-22 ENCOUNTER — Emergency Department (HOSPITAL_COMMUNITY): Payer: Medicare Other

## 2022-10-22 ENCOUNTER — Emergency Department (HOSPITAL_COMMUNITY)
Admission: EM | Admit: 2022-10-22 | Discharge: 2022-10-22 | Disposition: A | Discharge status: Home / Self Care | Payer: Medicare Other | Source: Home / Self Care

## 2022-10-22 DIAGNOSIS — R21 Rash and other nonspecific skin eruption: Secondary | ICD-10-CM | POA: Insufficient documentation

## 2022-10-22 DIAGNOSIS — Z7901 Long term (current) use of anticoagulants: Secondary | ICD-10-CM | POA: Insufficient documentation

## 2022-10-22 DIAGNOSIS — R1012 Left upper quadrant pain: Secondary | ICD-10-CM | POA: Insufficient documentation

## 2022-10-22 DIAGNOSIS — R1010 Upper abdominal pain, unspecified: Secondary | ICD-10-CM | POA: Diagnosis not present

## 2022-10-22 LAB — URINALYSIS, ROUTINE W REFLEX MICROSCOPIC
Bilirubin Urine: NEGATIVE
Bilirubin Urine: NEGATIVE
Glucose, UA: NEGATIVE mg/dL
Glucose, UA: NEGATIVE mg/dL
Hgb urine dipstick: NEGATIVE
Hgb urine dipstick: NEGATIVE
Ketones, ur: NEGATIVE mg/dL
Ketones, ur: NEGATIVE mg/dL
Leukocytes,Ua: NEGATIVE
Leukocytes,Ua: NEGATIVE
Nitrite: NEGATIVE
Nitrite: NEGATIVE
Protein, ur: NEGATIVE mg/dL
Protein, ur: NEGATIVE mg/dL
Specific Gravity, Urine: 1.02 (ref 1.005–1.030)
Specific Gravity, Urine: 1.025 (ref 1.005–1.030)
pH: 5 (ref 5.0–8.0)
pH: 5 (ref 5.0–8.0)

## 2022-10-22 LAB — COMPREHENSIVE METABOLIC PANEL
ALT: 17 U/L (ref 0–44)
ALT: 20 U/L (ref 0–44)
AST: 20 U/L (ref 15–41)
AST: 21 U/L (ref 15–41)
Albumin: 3.9 g/dL (ref 3.5–5.0)
Albumin: 4.1 g/dL (ref 3.5–5.0)
Alkaline Phosphatase: 52 U/L (ref 38–126)
Alkaline Phosphatase: 57 U/L (ref 38–126)
Anion gap: 10 (ref 5–15)
Anion gap: 9 (ref 5–15)
BUN: 13 mg/dL (ref 8–23)
BUN: 15 mg/dL (ref 8–23)
CO2: 27 mmol/L (ref 22–32)
CO2: 27 mmol/L (ref 22–32)
Calcium: 9.2 mg/dL (ref 8.9–10.3)
Calcium: 9.2 mg/dL (ref 8.9–10.3)
Chloride: 101 mmol/L (ref 98–111)
Chloride: 102 mmol/L (ref 98–111)
Creatinine, Ser: 0.97 mg/dL (ref 0.61–1.24)
Creatinine, Ser: 1.01 mg/dL (ref 0.61–1.24)
GFR, Estimated: >60 mL/min (ref >60)
GFR, Estimated: >60 mL/min (ref >60)
Glucose, Bld: 103 mg/dL — ABNORMAL HIGH (ref 70–99)
Glucose, Bld: 109 mg/dL — ABNORMAL HIGH (ref 70–99)
Potassium: 4.1 mmol/L (ref 3.5–5.1)
Potassium: 4.1 mmol/L (ref 3.5–5.1)
Sodium: 138 mmol/L (ref 135–145)
Sodium: 138 mmol/L (ref 135–145)
Total Bilirubin: 1 mg/dL (ref 0.3–1.2)
Total Bilirubin: 1.3 mg/dL — ABNORMAL HIGH (ref 0.3–1.2)
Total Protein: 5.8 g/dL — ABNORMAL LOW (ref 6.5–8.1)
Total Protein: 7.1 g/dL (ref 6.5–8.1)

## 2022-10-22 LAB — CBC
HCT: 48.7 % (ref 39.0–52.0)
HCT: 48.8 % (ref 39.0–52.0)
Hemoglobin: 16.1 g/dL (ref 13.0–17.0)
Hemoglobin: 16.3 g/dL (ref 13.0–17.0)
MCH: 29.3 pg (ref 26.0–34.0)
MCH: 29.8 pg (ref 26.0–34.0)
MCHC: 33 g/dL (ref 30.0–36.0)
MCHC: 33.5 g/dL (ref 30.0–36.0)
MCV: 88.7 fL (ref 80.0–100.0)
MCV: 89 fL (ref 80.0–100.0)
Platelets: 251 10*3/uL (ref 150–400)
Platelets: 275 10*3/uL (ref 150–400)
RBC: 5.47 MIL/uL (ref 4.22–5.81)
RBC: 5.5 MIL/uL (ref 4.22–5.81)
RDW: 12.9 % (ref 11.5–15.5)
RDW: 12.9 % (ref 11.5–15.5)
WBC: 6.9 10*3/uL (ref 4.0–10.5)
WBC: 9.5 10*3/uL (ref 4.0–10.5)
nRBC: 0 % (ref 0.0–0.2)
nRBC: 0 % (ref 0.0–0.2)

## 2022-10-22 LAB — LIPASE, BLOOD
Lipase: 32 U/L (ref 11–51)
Lipase: 90 U/L — ABNORMAL HIGH (ref 11–51)

## 2022-10-22 MED ORDER — LACTATED RINGERS IV BOLUS
1000.0000 mL | Freq: Once | INTRAVENOUS | Status: AC
  Administered 2022-10-22: 1000 mL via INTRAVENOUS

## 2022-10-22 MED ORDER — IOHEXOL 300 MG/ML  SOLN
100.0000 mL | Freq: Once | INTRAMUSCULAR | Status: AC | PRN
  Administered 2022-10-22: 100 mL via INTRAVENOUS

## 2022-10-22 NOTE — ED Notes (Signed)
Pt called for room no answer and not seen in lobby.

## 2022-10-22 NOTE — ED Triage Notes (Signed)
Pt arrived via POV. C/o gen pruritic rash over body for 3x days, and LUQ abd for 2x days.  AOx4

## 2022-10-22 NOTE — Discharge Instructions (Addendum)
Your exam and workup today were reassuring.  Follow-up with your primary care provider.  Continue taking MiraLAX for your constipation.  Continue taking Diflucan that was prescribed by your primary care provider.  For any concerning symptoms return to the emergency room.

## 2022-10-22 NOTE — ED Provider Notes (Signed)
[facility] EMERGENCY DEPARTMENT AT [facility] Provider Note   CSN: [id number] Arrival date & time: 10/22/22  1204     History  Chief Complaint  Patient presents with   Rash   Abdominal Pain    Jonathan Powers is a 72 y.o. male.  72 year old male presents today for concern of left upper quadrant abdominal pain and generalized rash.  He was seen for the same complaints by his PCP 2 days ago.  He was recommended bowel regimen for his abdominal pain this was believed to be secondary to constipation, and he was prescribed Diflucan for his rash.  He denies any significant worsening of the symptoms.  Denies nausea, vomiting, dysuria, or other complaints.  The history is provided by the patient. No language interpreter was used.       Home Medications Prior to Admission medications   Medication Sig Start Date End Date Taking? Authorizing Provider  acetaminophen (TYLENOL) 500 MG tablet Take 1,000 mg by mouth every 6 (six) hours as needed for moderate pain.  Patient not taking: Reported on 10/20/2022    [provider]  albuterol (VENTOLIN HFA) 108 (90 Base) MCG/ACT inhaler Inhale 1-2 puffs into the lungs every 6 (six) hours as needed for wheezing or shortness of breath. Patient not taking: Reported on 09/26/2022 06/20/22   Raspet, Noberto Retort, PA-C  cetirizine (ZYRTEC) 10 MG tablet Take 10 mg by mouth daily as needed for allergies. Patient not taking: Reported on 10/20/2022    [provider]  doxycycline (VIBRA-TABS) 100 MG tablet Take 1 tablet (100 mg total) by mouth 2 (two) times daily for 14 days. Patient not taking: Reported on 10/20/2022 10/17/22 10/31/22  Kathryne Hitch, MD  erythromycin ophthalmic ointment as needed. Patient not taking: Reported on 10/20/2022 09/21/22   [provider]  esomeprazole (NEXIUM) 40 MG capsule Take 1 capsule (40 mg total) by mouth daily. Patient not taking: Reported on 09/26/2022 03/19/21   [name], MD   fluconazole (DIFLUCAN) 150 MG tablet Take 1 tablet (150 mg total) by mouth once a week. 10/20/22   McElwee, Lauren A, NP  fluticasone (FLONASE) 50 MCG/ACT nasal spray Place 1 spray into both nostrils daily as needed for allergies. Patient not taking: Reported on 10/20/2022 07/06/21   [provider]  ketoconazole (NIZORAL) 2 % cream Apply 1 Application topically daily as needed for irritation (rash). Patient not taking: Reported on 10/20/2022 08/16/21   [provider]  levothyroxine (SYNTHROID) 175 MCG tablet TAKE 1 TABLET BY MOUTH ONCE DAILY BEFORE BREAKFAST 01/28/22   [name], MD  memantine (NAMENDA) 5 MG tablet Take 1 tablet (5 mg at night) for 2 weeks, then increase to 1 tablet (5 mg) twice a day Patient not taking: Reported on 09/26/2022 04/27/22   Marcos Eke, PA-C  olmesartan-hydrochlorothiazide (BENICAR HCT) 40-25 MG tablet TAKE 1 TABLET BY MOUTH ONCE DAILY AS DIRECTED Patient not taking: Reported on 10/20/2022 08/17/22   Yates Decamp, MD  Pancrelipase, Lip-Prot-Amyl, (ZENPEP) 25000-79000 units CPEP Take 1 capsule by mouth 3 (three) times daily with meals. Patient not taking: Reported on 09/26/2022 08/02/22   [name], MD  rosuvastatin (CRESTOR) 5 MG tablet Take 1 tablet (5 mg total) by mouth daily. Patient not taking: Reported on 09/26/2022 11/01/21   [name], MD  tadalafil (CIALIS) 5 MG tablet Take 1 tablet (5 mg total) by mouth daily as needed for erectile dysfunction. 12/16/21   [name], MD  terbinafine (LAMISIL AT) 1 % cream Apply 1 Application topically 2 (two) times daily. Patient not taking: Reported on 10/20/2022 08/30/22   Adonis Huguenin, NP  Testosterone 1.62 % GEL  11/26/20   [provider]  traMADol (ULTRAM) 50 MG tablet 1-2 tablets every 6 hours as needed for pain Patient not taking: Reported on 09/26/2022 07/12/22   Arby Barrette, MD  traZODone (DESYREL) 100 MG tablet TAKE 1 TABLET BY MOUTH AT BEDTIME AS NEEDED FOR SLEEP 09/05/22    [name], MD  apixaban (ELIQUIS) 2.5 MG TABS tablet Take 1 tablet (2.5 mg total) by mouth 2 (two) times daily. 03/07/19 05/16/19  Allena Katz, PA-C      Allergies    Nsaids and Tolmetin    Review of Systems   Review of Systems  Constitutional:  Negative for chills and fever.  Respiratory:  Negative for shortness of breath.   Cardiovascular:  Negative for chest pain.  Gastrointestinal:  Positive for abdominal pain. Negative for nausea and vomiting.  Neurological:  Negative for light-headedness.  All other systems reviewed and are negative.   Physical Exam Updated Vital Signs BP (!) 146/85   Pulse (!) 58   Temp 98.3 F (36.8 C) (Oral)   Resp 17   Ht 5\' 11"  (1.803 m)   Wt 114 kg   SpO2 98%   BMI 35.05 kg/m  Physical Exam Vitals and nursing note reviewed.  Constitutional:      General: He is not in acute distress.    Appearance: Normal appearance. He is not ill-appearing.  HENT:     Head: Normocephalic and atraumatic.     Nose: Nose normal.  Eyes:     General: No scleral icterus.    Extraocular Movements: Extraocular movements intact.     Conjunctiva/sclera: Conjunctivae normal.  Cardiovascular:     Rate and Rhythm: Normal rate and regular rhythm.     Heart sounds: Normal heart sounds.  Pulmonary:     Effort: Pulmonary effort is normal. No respiratory distress.     Breath sounds: Normal breath sounds. No wheezing or rales.  Abdominal:     General: There is no distension.     Palpations: Abdomen is soft.     Tenderness: There is no abdominal tenderness. There is no right CVA tenderness, left CVA tenderness, guarding or rebound.  Musculoskeletal:        General: Normal range of motion.     Cervical back: Normal range of motion.  Skin:    General: Skin is warm and dry.  Neurological:     General: No focal deficit present.     Mental Status: He is alert. Mental status is at baseline.     ED Results / Procedures / Treatments   Labs (all labs ordered  are listed, but only abnormal results are displayed) Labs Reviewed  COMPREHENSIVE METABOLIC PANEL - Abnormal; Notable for the following components:      Result Value   Glucose, Bld 109 (*)    Total Bilirubin 1.3 (*)    All other components within normal limits  LIPASE, BLOOD  CBC  URINALYSIS, ROUTINE W REFLEX MICROSCOPIC    EKG EKG Interpretation Date/Time:  Sunday October 22 2022 17:23:01 EDT Ventricular Rate:  59 PR Interval:  179 QRS Duration:  92 QT Interval:  445 QTC Calculation: 441 R Axis:   62  Text Interpretation: Sinus rhythm Confirmed by Estanislado Pandy [phone]) on 10/22/2022 5:44:21 PM  Radiology CT ABDOMEN PELVIS W CONTRAST  Result Date: 10/22/2022 CLINICAL DATA:  Abdominal pain. Rash over body for 3 days. LEFT upper quadrant pain for 2 days. EXAM: CT ABDOMEN AND PELVIS WITH CONTRAST TECHNIQUE: Multidetector CT imaging of the abdomen and pelvis was performed using the standard protocol following bolus administration of intravenous contrast. RADIATION DOSE REDUCTION: This exam was performed according to the departmental dose-optimization program which includes automated exposure control, adjustment of the mA and/or kV according to patient size and/or use of iterative reconstruction technique. CONTRAST:  OMNIPAQUE IOHEXOL 300 MG/ML  SOLN COMPARISON:  None Available. FINDINGS: Lower chest: Lung bases are clear. Hepatobiliary: No focal hepatic lesion. Postcholecystectomy. No biliary dilatation. Pancreas: Pancreas is normal. No ductal dilatation. No pancreatic inflammation. Spleen: Normal spleen Adrenals/urinary tract: Adrenal glands and kidneys are normal. The ureters and bladder normal. Stomach/Bowel: Stomach, small bowel, appendix, and cecum are normal. The colon and rectosigmoid colon are normal. Vascular/Lymphatic: Abdominal aorta is normal caliber with atherosclerotic calcification. There is no retroperitoneal or periportal lymphadenopathy. No pelvic lymphadenopathy.  Reproductive: Unremarkable. Significant streak artifact from bilateral hip prosthetics. Other: No free fluid. Musculoskeletal: Follow-up hip prosthetics.  No acute findings IMPRESSION: 1. No acute findings in the abdomen pelvis. 2. Normal appendix. 3. Postcholecystectomy. 4.  Aortic Atherosclerosis (ICD10-I70.0). Electronically Signed   By: Genevive Bi M.D.   On: 10/22/2022 18:53    Procedures Procedures    Medications Ordered in ED Medications  lactated ringers bolus 1,000 mL (1,000 mLs Intravenous New Bag/Given 10/22/22 1811)  iohexol (OMNIPAQUE) 300 MG/ML solution 100 mL (100 mLs Intravenous Contrast Given 10/22/22 1821)    ED Course/ Medical Decision Making/ A&P                                 Medical Decision Making Amount and/or Complexity of Data Reviewed Labs: ordered. Radiology: ordered.  Risk Prescription drug management.   72 year old male presents today for concern of abdominal pain and rash.  Seen by PCP for same complaints 2 days ago.  He is without acute distress.  Abdominal exam benign.  Lab work overall reassuring.  CBC unremarkable, CMP shows glucose of 109 otherwise unremarkable.  Lipase within normal.  UA without evidence of UTI.  EKG without acute ischemic changes.  Low suspicion for ACS given no chest pain or other anginal symptoms.  CT abdomen pelvis with contrast without evidence of acute intra-abdominal or pelvic finding.  Reinforced the importance of bowel regimen.  He will continue taking Diflucan for the rash.  He will follow-up with his PCP.  Patient discussed with attending who also evaluated patient.   Final Clinical Impression(s) / ED Diagnoses Final diagnoses:  Left upper quadrant abdominal pain    Rx / DC Orders ED Discharge Orders     None         Marita Kansas, PA-C 10/22/22 1918    Coral Spikes, DO 10/22/22 2309

## 2022-10-23 ENCOUNTER — Encounter (HOSPITAL_COMMUNITY): Payer: Self-pay

## 2022-10-23 ENCOUNTER — Other Ambulatory Visit: Payer: Self-pay

## 2022-10-23 ENCOUNTER — Emergency Department (HOSPITAL_COMMUNITY): Payer: Medicare Other

## 2022-10-23 ENCOUNTER — Emergency Department (HOSPITAL_COMMUNITY)
Admission: EM | Admit: 2022-10-23 | Discharge: 2022-10-23 | Disposition: A | Discharge status: Home / Self Care | Payer: Medicare Other | Attending: Emergency Medicine | Admitting: Emergency Medicine

## 2022-10-23 DIAGNOSIS — B353 Tinea pedis: Secondary | ICD-10-CM | POA: Insufficient documentation

## 2022-10-23 DIAGNOSIS — Z79899 Other long term (current) drug therapy: Secondary | ICD-10-CM | POA: Insufficient documentation

## 2022-10-23 DIAGNOSIS — M7989 Other specified soft tissue disorders: Secondary | ICD-10-CM | POA: Diagnosis not present

## 2022-10-23 DIAGNOSIS — M19072 Primary osteoarthritis, left ankle and foot: Secondary | ICD-10-CM | POA: Diagnosis not present

## 2022-10-23 DIAGNOSIS — I10 Essential (primary) hypertension: Secondary | ICD-10-CM | POA: Diagnosis not present

## 2022-10-23 DIAGNOSIS — E039 Hypothyroidism, unspecified: Secondary | ICD-10-CM | POA: Diagnosis not present

## 2022-10-23 DIAGNOSIS — L03116 Cellulitis of left lower limb: Secondary | ICD-10-CM | POA: Diagnosis not present

## 2022-10-23 DIAGNOSIS — L03032 Cellulitis of left toe: Secondary | ICD-10-CM | POA: Diagnosis not present

## 2022-10-23 DIAGNOSIS — F0390 Unspecified dementia without behavioral disturbance: Secondary | ICD-10-CM | POA: Diagnosis not present

## 2022-10-23 DIAGNOSIS — Z7901 Long term (current) use of anticoagulants: Secondary | ICD-10-CM | POA: Diagnosis not present

## 2022-10-23 DIAGNOSIS — M79672 Pain in left foot: Secondary | ICD-10-CM | POA: Diagnosis not present

## 2022-10-23 MED ORDER — CEPHALEXIN 250 MG PO CAPS
500.0000 mg | ORAL_CAPSULE | Freq: Once | ORAL | Status: AC
  Administered 2022-10-23: 500 mg via ORAL
  Filled 2022-10-23: qty 2

## 2022-10-23 MED ORDER — TERBINAFINE HCL 1 % EX CREA: 1.0000 | TOPICAL_CREAM | Freq: Two times a day (BID) | CUTANEOUS | 0 refills | Status: DC

## 2022-10-23 MED ORDER — CEPHALEXIN 500 MG PO CAPS: 500.0000 mg | ORAL_CAPSULE | Freq: Four times a day (QID) | ORAL | 0 refills | Status: DC

## 2022-10-23 NOTE — ED Triage Notes (Signed)
Pt arrives with c/o left foot pain that started yesterday. Pt denies injury. Pt ambulatory to triage.

## 2022-10-23 NOTE — Discharge Instructions (Signed)
You were seen in the emergency department for your foot pain.  Your x-ray showed no new injuries.  Does appear that you do have a fungal infection and signs of early skin infection between your toes on your left foot.  I have given you a course of antibiotics and you should complete this as prescribed.  I will also given you an antifungal cream that you can apply topically between your toes.  You can follow-up with your orthopedic doctor to have your foot rechecked.  You should return to the emergency department if you are having streaking redness up your legs, fevers despite the antibiotics, repetitive vomiting and unable to tolerate the antibiotics or any other new or concerning symptoms.

## 2022-10-23 NOTE — ED Provider Notes (Signed)
[place] EMERGENCY DEPARTMENT AT [facility] Provider Note   CSN: [id number] Arrival date & time: 10/23/22  2021     History  Chief Complaint  Patient presents with   Foot Pain    Jonathan Powers is a 72 y.o. male.  Patient is a 72 year old male with a past medical history of hypertension, hypothyroidism, mild dementia presenting to the emergency department with left foot pain.  The patient states that he has had pain in his foot for the last day the pain is mostly between his fourth and fifth toes.  He states that he has been seeing his orthopedic foot doctor for similar pain and was previously on an antifungal.  He states that he ran out of that medication and has not been taking it recently.  He denies any trauma or falls, fevers or chills, numbness or weakness.  He states that he has had a prior surgery on this foot 10 years ago.  The history is provided by the patient.  Foot Pain       Home Medications Prior to Admission medications   Medication Sig Start Date End Date Taking? Authorizing Provider  cephALEXin (KEFLEX) 500 MG capsule Take 1 capsule (500 mg total) by mouth 4 (four) times daily. 10/23/22  Yes Theresia Lo, [place] K, DO  terbinafine (LAMISIL) 1 % cream Apply 1 Application topically 2 (two) times daily. 10/23/22  Yes Elayne Snare K, DO  acetaminophen (TYLENOL) 500 MG tablet Take 1,000 mg by mouth every 6 (six) hours as needed for moderate pain.  Patient not taking: Reported on 10/20/2022    [provider]  albuterol (VENTOLIN HFA) 108 (90 Base) MCG/ACT inhaler Inhale 1-2 puffs into the lungs every 6 (six) hours as needed for wheezing or shortness of breath. Patient not taking: Reported on 09/26/2022 06/20/22   Raspet, Noberto Retort, PA-C  cetirizine (ZYRTEC) 10 MG tablet Take 10 mg by mouth daily as needed for allergies. Patient not taking: Reported on 10/20/2022    [provider]  doxycycline (VIBRA-TABS) 100 MG tablet Take 1 tablet (100 mg  total) by mouth 2 (two) times daily for 14 days. Patient not taking: Reported on 10/20/2022 10/17/22 10/31/22  Kathryne Hitch, MD  erythromycin ophthalmic ointment as needed. Patient not taking: Reported on 10/20/2022 09/21/22   [provider]  esomeprazole (NEXIUM) 40 MG capsule Take 1 capsule (40 mg total) by mouth daily. Patient not taking: Reported on 09/26/2022 03/19/21   [name], MD  fluconazole (DIFLUCAN) 150 MG tablet Take 1 tablet (150 mg total) by mouth once a week. 10/20/22   McElwee, Lauren A, NP  fluticasone (FLONASE) 50 MCG/ACT nasal spray Place 1 spray into both nostrils daily as needed for allergies. Patient not taking: Reported on 10/20/2022 07/06/21   [provider]  ketoconazole (NIZORAL) 2 % cream Apply 1 Application topically daily as needed for irritation (rash). Patient not taking: Reported on 10/20/2022 08/16/21   [provider]  levothyroxine (SYNTHROID) 175 MCG tablet TAKE 1 TABLET BY MOUTH ONCE DAILY BEFORE BREAKFAST 01/28/22   [name], MD  memantine (NAMENDA) 5 MG tablet Take 1 tablet (5 mg at night) for 2 weeks, then increase to 1 tablet (5 mg) twice a day Patient not taking: Reported on 09/26/2022 04/27/22   Marcos Eke, PA-C  olmesartan-hydrochlorothiazide (BENICAR HCT) 40-25 MG tablet TAKE 1 TABLET BY MOUTH ONCE DAILY AS DIRECTED Patient not taking: Reported on 10/20/2022 08/17/22   Yates Decamp, MD  Pancrelipase, Lip-Prot-Amyl, (ZENPEP) 25000-79000 units CPEP Take 1 capsule by mouth 3 (three) times daily with meals. Patient not taking: Reported on 09/26/2022 08/02/22   [name], MD  rosuvastatin (CRESTOR) 5 MG tablet Take 1 tablet (5 mg total) by mouth daily. Patient not taking: Reported on 09/26/2022 11/01/21   [name], MD  tadalafil (CIALIS) 5 MG tablet Take 1 tablet (5 mg total) by mouth daily as needed for erectile dysfunction. 12/16/21   [name], MD  Testosterone 1.62 % GEL  11/26/20   [provider]  traMADol (ULTRAM) 50 MG tablet 1-2 tablets every 6 hours as needed for pain Patient not taking: Reported on 09/26/2022 07/12/22   Arby Barrette, MD  traZODone (DESYREL) 100 MG tablet TAKE 1 TABLET BY MOUTH AT BEDTIME AS NEEDED FOR SLEEP 09/05/22   [name], MD  apixaban (ELIQUIS) 2.5 MG TABS tablet Take 1 tablet (2.5 mg total) by mouth 2 (two) times daily. 03/07/19 05/16/19  Allena Katz, PA-C      Allergies    Nsaids and Tolmetin    Review of Systems   Review of Systems  Physical Exam Updated Vital Signs BP (!) 156/88 (BP Location: Right Arm)   Pulse 69   Temp 98.7 F (37.1 C) (Oral)   Resp 17   Ht 5\' 11"  (1.803 m)   Wt 113.9 kg   SpO2 93%   BMI 35.01 kg/m  Physical Exam Vitals and nursing note reviewed.  Constitutional:      General: He is not in acute distress.    Appearance: Normal appearance.  HENT:     Head: Normocephalic.     Nose: Nose normal.     Mouth/Throat:     Mouth: Mucous membranes are moist.  Eyes:     Extraocular Movements: Extraocular movements intact.  Cardiovascular:     Rate and Rhythm: Normal rate.     Pulses: Normal pulses.  Pulmonary:     Effort: Pulmonary effort is normal.  Abdominal:     General: Abdomen is flat.  Musculoskeletal:        General: Normal range of motion.     Cervical back: Normal range of motion.     Comments: Tenderness to palpation between fourth and fifth and third and fourth toes of the left foot, no obvious deformity, no swelling  Skin:    General: Skin is warm and dry.     Comments: Skin breakdown and maceration between third and fourth and fourth and fifth toes of the left foot with some mild erythema and warmth, no active draining  Neurological:     General: No focal deficit present.     Mental Status: He is alert and oriented to person, place, and time.     Sensory: No sensory deficit.     Motor: No weakness.  Psychiatric:        Mood and Affect: Mood normal.        Behavior: Behavior normal.      ED Results / Procedures / Treatments   Labs (all labs ordered are listed, but only abnormal results are displayed) Labs Reviewed - No data to display  EKG None  Radiology DG Foot Complete Left  Result Date: 10/23/2022 CLINICAL DATA:  Left foot pain since yesterday EXAM: LEFT FOOT - COMPLETE 3+ VIEW COMPARISON:  Radiographs 09/26/2022 FINDINGS: Demineralization. Postoperative changes about the calcaneus and talus with fusion of the subtalar joint. Degenerative changes about the midfoot. Mild soft tissue  swelling about the midfoot. No acute fracture. IMPRESSION: Soft tissue swelling about the midfoot. No acute osseous abnormality. Postsurgical changes about the hindfoot. Electronically Signed   By: Minerva Fester M.D.   On: 10/23/2022 21:28   CT ABDOMEN PELVIS W CONTRAST  Result Date: 10/22/2022 CLINICAL DATA:  Abdominal pain. Rash over body for 3 days. LEFT upper quadrant pain for 2 days. EXAM: CT ABDOMEN AND PELVIS WITH CONTRAST TECHNIQUE: Multidetector CT imaging of the abdomen and pelvis was performed using the standard protocol following bolus administration of intravenous contrast. RADIATION DOSE REDUCTION: This exam was performed according to the departmental dose-optimization program which includes automated exposure control, adjustment of the mA and/or kV according to patient size and/or use of iterative reconstruction technique. CONTRAST:  OMNIPAQUE IOHEXOL 300 MG/ML  SOLN COMPARISON:  None Available. FINDINGS: Lower chest: Lung bases are clear. Hepatobiliary: No focal hepatic lesion. Postcholecystectomy. No biliary dilatation. Pancreas: Pancreas is normal. No ductal dilatation. No pancreatic inflammation. Spleen: Normal spleen Adrenals/urinary tract: Adrenal glands and kidneys are normal. The ureters and bladder normal. Stomach/Bowel: Stomach, small bowel, appendix, and cecum are normal. The colon and rectosigmoid colon are normal. Vascular/Lymphatic: Abdominal aorta is normal  caliber with atherosclerotic calcification. There is no retroperitoneal or periportal lymphadenopathy. No pelvic lymphadenopathy. Reproductive: Unremarkable. Significant streak artifact from bilateral hip prosthetics. Other: No free fluid. Musculoskeletal: Follow-up hip prosthetics.  No acute findings IMPRESSION: 1. No acute findings in the abdomen pelvis. 2. Normal appendix. 3. Postcholecystectomy. 4.  Aortic Atherosclerosis (ICD10-I70.0). Electronically Signed   By: Genevive Bi M.D.   On: 10/22/2022 18:53    Procedures Procedures    Medications Ordered in ED Medications  cephALEXin (KEFLEX) capsule 500 mg (has no administration in time range)    ED Course/ Medical Decision Making/ A&P                                 Medical Decision Making This patient presents to the ED with chief complaint(s) of foot pain with pertinent past medical history of HTN, hypothyroid, mild dementia which further complicates the presenting complaint. The complaint involves an extensive differential diagnosis and also carries with it a high risk of complications and morbidity.    The differential diagnosis includes fracture, sprain though less likely without any trauma, cellulitis, fungal infection, osteomyelitis, no signs of sepsis  Additional history obtained: Additional history obtained from N/A Records reviewed outpatient orthopedic records  ED Course and Reassessment: On patient's arrival he is hemodynamically stable in no acute distress.  Patient had x-ray of his left foot performed that showed postoperative changes but no acute abnormalities to explain his symptoms.  He does have areas of skin breakdown and maceration with some mild erythema and warmth between his third and fourth and fourth and fifth toes concerning for fungal infection versus a cellulitis.  He will be started on course of antibiotics and given antifungal cream and recommended outpatient orthopedic follow-up.  Independent labs  interpretation:  N/A  Independent visualization of imaging: - I independently visualized the following imaging with scope of interpretation limited to determining acute life threatening conditions related to emergency care: L foot XR, which revealed no acute abnormality  Consultation: - Consulted or discussed management/test interpretation w/ external professional: N/A  Consideration for admission or further workup: Patient has no emergent conditions requiring admission or further work-up at this time and is stable for discharge home with orthopedics follow-up  Social Determinants  of health: N/A    Amount and/or Complexity of Data Reviewed Radiology: ordered.          Final Clinical Impression(s) / ED Diagnoses Final diagnoses:  Cellulitis of toe of left foot  Tinea pedis of left foot    Rx / DC Orders ED Discharge Orders          Ordered    cephALEXin (KEFLEX) 500 MG capsule  4 times daily        10/23/22 2216    terbinafine (LAMISIL) 1 % cream  2 times daily        10/23/22 2216              Rexford Maus, DO 10/23/22 2218

## 2022-10-25 DIAGNOSIS — H0102B Squamous blepharitis left eye, upper and lower eyelids: Secondary | ICD-10-CM | POA: Diagnosis not present

## 2022-10-25 DIAGNOSIS — H43393 Other vitreous opacities, bilateral: Secondary | ICD-10-CM | POA: Diagnosis not present

## 2022-10-25 DIAGNOSIS — H2513 Age-related nuclear cataract, bilateral: Secondary | ICD-10-CM | POA: Diagnosis not present

## 2022-10-25 DIAGNOSIS — H04123 Dry eye syndrome of bilateral lacrimal glands: Secondary | ICD-10-CM | POA: Diagnosis not present

## 2022-10-25 DIAGNOSIS — H0102A Squamous blepharitis right eye, upper and lower eyelids: Secondary | ICD-10-CM | POA: Diagnosis not present

## 2022-10-25 DIAGNOSIS — H40013 Open angle with borderline findings, low risk, bilateral: Secondary | ICD-10-CM | POA: Diagnosis not present

## 2022-10-25 DIAGNOSIS — H35033 Hypertensive retinopathy, bilateral: Secondary | ICD-10-CM | POA: Diagnosis not present

## 2022-10-27 ENCOUNTER — Ambulatory Visit: Payer: Medicare Other

## 2022-10-30 ENCOUNTER — Encounter: Payer: Self-pay | Admitting: Physician Assistant

## 2022-10-30 ENCOUNTER — Ambulatory Visit (INDEPENDENT_AMBULATORY_CARE_PROVIDER_SITE_OTHER): Payer: Medicare Other | Admitting: Physician Assistant

## 2022-10-30 ENCOUNTER — Ambulatory Visit: Payer: Medicare Other | Admitting: Physician Assistant

## 2022-10-30 VITALS — BP 144/74 | HR 76 | Resp 98 | Ht 71.0 in | Wt 253.0 lb

## 2022-10-30 DIAGNOSIS — G309 Alzheimer's disease, unspecified: Secondary | ICD-10-CM | POA: Diagnosis not present

## 2022-10-30 DIAGNOSIS — F0670 Mild neurocognitive disorder due to known physiological condition without behavioral disturbance: Secondary | ICD-10-CM

## 2022-10-30 MED ORDER — MEMANTINE HCL 10 MG PO TABS: ORAL_TABLET | ORAL | 11 refills | Status: DC
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
## 2022-10-30 NOTE — Patient Instructions (Signed)
It was a pleasure to see you today at our office.   Recommendations:  Follow up in 6 months Increase memantine to 10 mg at night  Neurocognitive testing in 1 year       RECOMMENDATIONS FOR ALL PATIENTS WITH MEMORY PROBLEMS: 1. Continue to exercise (Recommend 30 minutes of walking everyday, or 3 hours every week) 2. Increase social interactions - continue going to [place] and enjoy social gatherings with friends and family 3. Eat healthy, avoid fried foods and eat more fruits and vegetables 4. Maintain adequate blood pressure, blood sugar, and blood cholesterol level. Reducing the risk of stroke and cardiovascular disease also helps promoting better memory. 5. Avoid stressful situations. Live a simple life and avoid aggravations. Organize your time and prepare for the next day in anticipation. 6. Sleep well, avoid any interruptions of sleep and avoid any distractions in the bedroom that may interfere with adequate sleep quality 7. Avoid sugar, avoid sweets as there is a strong link between excessive sugar intake, diabetes, and cognitive impairment We discussed the Mediterranean diet, which has been shown to help patients reduce the risk of progressive memory disorders and reduces cardiovascular risk. This includes eating fish, eat fruits and green leafy vegetables, nuts like almonds and hazelnuts, walnuts, and also use olive oil. Avoid fast foods and fried foods as much as possible. Avoid sweets and sugar as sugar use has been linked to worsening of memory function.  There is always a concern of gradual progression of memory problems. If this is the case, then we may need to adjust level of care according to patient needs. Support, both to the patient and caregiver, should then be put into place.     FALL PRECAUTIONS: Be cautious when walking. Scan the area for obstacles that may increase the risk of trips and falls. When getting up in the mornings, sit up at the edge of the bed for a few  minutes before getting out of bed. Consider elevating the bed at the head end to avoid drop of blood pressure when getting up. Walk always in a well-lit room (use night lights in the walls). Avoid area rugs or power cords from appliances in the middle of the walkways. Use a walker or a cane if necessary and consider physical therapy for balance exercise. Get your eyesight checked regularly.  FINANCIAL OVERSIGHT: Supervision, especially oversight when making financial decisions or transactions is also recommended.  HOME SAFETY: Consider the safety of the kitchen when operating appliances like stoves, microwave oven, and blender. Consider having supervision and share cooking responsibilities until no longer able to participate in those. Accidents with firearms and other hazards in the house should be identified and addressed as well.   ABILITY TO BE LEFT ALONE: If patient is unable to contact 911 operator, consider using LifeLine, or when the need is there, arrange for someone to stay with patients. Smoking is a fire hazard, consider supervision or cessation. Risk of wandering should be assessed by caregiver and if detected at any point, supervision and safe proof recommendations should be instituted.  MEDICATION SUPERVISION: Inability to self-administer medication needs to be constantly addressed. Implement a mechanism to ensure safe administration of the medications.   DRIVING: Regarding driving, in patients with progressive memory problems, driving will be impaired. We advise to have someone else do the driving if trouble finding directions or if minor accidents are reported. Independent driving assessment is available to determine safety of driving.   If you are interested in the  driving assessment, you can contact the following:  [employer] in [place] [phone]  [facility] [phone]  [facility] [phone]  or [phone]    Mediterranean Diet A Mediterranean diet refers to food and lifestyle choices that are based on the traditions of countries located on the [employer]. This way of eating has been shown to help prevent certain conditions and improve outcomes for people who have chronic diseases, like kidney disease and heart disease. What are tips for following this plan? Lifestyle  Cook and eat meals together with your family, when possible. Drink enough fluid to keep your urine clear or pale yellow. Be physically active every day. This includes: Aerobic exercise like running or swimming. Leisure activities like gardening, walking, or housework. Get 7-8 hours of sleep each night. If recommended by your health care provider, drink red wine in moderation. This means 1 glass a day for nonpregnant women and 2 glasses a day for men. A glass of wine equals 5 oz (150 mL). Reading food labels  Check the serving size of packaged foods. For foods such as rice and pasta, the serving size refers to the amount of cooked product, not dry. Check the total fat in packaged foods. Avoid foods that have saturated fat or trans fats. Check the ingredients list for added sugars, such as corn syrup. Shopping  At the grocery store, buy most of your food from the areas near the walls of the store. This includes: Fresh fruits and vegetables (produce). Grains, beans, nuts, and seeds. Some of these may be available in unpackaged forms or large amounts (in bulk). Fresh seafood. Poultry and eggs. Low-fat dairy products. Buy whole ingredients instead of prepackaged foods. Buy fresh fruits and vegetables in-season from local farmers markets. Buy frozen fruits and vegetables in resealable bags. If you do not have access to quality fresh seafood, buy precooked frozen shrimp or canned fish, such as tuna, salmon, or sardines. Buy small amounts of raw or cooked vegetables, salads, or olives from the deli or salad  bar at your store. Stock your pantry so you always have certain foods on hand, such as olive oil, canned tuna, canned tomatoes, rice, pasta, and beans. Cooking  Cook foods with extra-virgin olive oil instead of using butter or other vegetable oils. Have meat as a side dish, and have vegetables or grains as your main dish. This means having meat in small portions or adding small amounts of meat to foods like pasta or stew. Use beans or vegetables instead of meat in common dishes like chili or lasagna. Experiment with different cooking methods. Try roasting or broiling vegetables instead of steaming or sauteing them. Add frozen vegetables to soups, stews, pasta, or rice. Add nuts or seeds for added healthy fat at each meal. You can add these to yogurt, salads, or vegetable dishes. Marinate fish or vegetables using olive oil, lemon juice, garlic, and fresh herbs. Meal planning  Plan to eat 1 vegetarian meal one day each week. Try to work up to 2 vegetarian meals, if possible. Eat seafood 2 or more times a week. Have healthy snacks readily available, such as: Vegetable sticks with hummus. Greek yogurt. Fruit and nut trail mix. Eat balanced meals throughout the week. This includes: Fruit: 2-3 servings a day Vegetables: 4-5 servings a day Low-fat dairy: 2 servings a day Fish, poultry, or lean meat: 1 serving a day Beans and legumes: 2 or more servings a week Nuts and seeds:  1-2 servings a day Whole grains: 6-8 servings a day Extra-virgin olive oil: 3-4 servings a day Limit red meat and sweets to only a few servings a month What are my food choices? Mediterranean diet Recommended Grains: Whole-grain pasta. Brown rice. Bulgar wheat. Polenta. Couscous. Whole-wheat bread. Orpah Cobb. Vegetables: Artichokes. Beets. Broccoli. Cabbage. Carrots. Eggplant. Green beans. Chard. Kale. Spinach. Onions. Leeks. Peas. Squash. Tomatoes. Peppers. Radishes. Fruits: Apples. Apricots. Avocado. Berries.  Bananas. Cherries. Dates. Figs. Grapes. Lemons. Melon. Oranges. Peaches. Plums. Pomegranate. Meats and other protein foods: Beans. Almonds. Sunflower seeds. Pine nuts. Peanuts. Cod. Salmon. Scallops. Shrimp. Tuna. Tilapia. Clams. Oysters. Eggs. Dairy: Low-fat milk. Cheese. Greek yogurt. Beverages: Water. Red wine. Herbal tea. Fats and oils: Extra virgin olive oil. Avocado oil. Grape seed oil. Sweets and desserts: [place] yogurt with honey. Baked apples. Poached pears. Trail mix. Seasoning and other foods: Basil. Cilantro. Coriander. Cumin. Mint. Parsley. Sage. Rosemary. Tarragon. Garlic. Oregano. Thyme. Pepper. Balsalmic vinegar. Tahini. Hummus. Tomato sauce. Olives. Mushrooms. Limit these Grains: Prepackaged pasta or rice dishes. Prepackaged cereal with added sugar. Vegetables: Deep fried potatoes (french fries). Fruits: Fruit canned in syrup. Meats and other protein foods: Beef. Pork. Lamb. Poultry with skin. Hot dogs. Tomasa Blase. Dairy: Ice cream. Sour cream. Whole milk. Beverages: Juice. Sugar-sweetened soft drinks. Beer. Liquor and spirits. Fats and oils: Butter. Canola oil. Vegetable oil. Beef fat (tallow). Lard. Sweets and desserts: Cookies. Cakes. Pies. Candy. Seasoning and other foods: Mayonnaise. Premade sauces and marinades. The items listed may not be a complete list. Talk with your dietitian about what dietary choices are right for you. Summary The Mediterranean diet includes both food and lifestyle choices. Eat a variety of fresh fruits and vegetables, beans, nuts, seeds, and whole grains. Limit the amount of red meat and sweets that you eat. Talk with your health care provider about whether it is safe for you to drink red wine in moderation. This means 1 glass a day for nonpregnant women and 2 glasses a day for men. A glass of wine equals 5 oz (150 mL). This information is not intended to replace advice given to you by your health care provider. Make sure you discuss any questions you  have with your health care provider. Document Released: 09/30/2015 Document Revised: 11/02/2015 Document Reviewed: 09/30/2015 Elsevier Interactive Patient Education  2017 [employer].

## 2022-11-06 ENCOUNTER — Telehealth: Payer: Self-pay | Admitting: Internal Medicine

## 2022-11-06 NOTE — Telephone Encounter (Signed)
Jonathan Powers needs a prior auth on a back brace for this pt. Mia mentioned she faxed this over multiple times as well.[name]

## 2022-11-07 ENCOUNTER — Telehealth: Payer: Self-pay

## 2022-11-07 NOTE — Telephone Encounter (Signed)
Transition Care Management Follow-up Telephone Call Date of discharge and from where: 10/23/2022 [facility] How have you been since you were released from the hospital? Patient stated his left toe is feeling better. He is congested and feels like he may have an URI. He stated he will follow-up with his PCP. Any questions or concerns? No  Items Reviewed: Did the pt receive and understand the discharge instructions provided? Yes  Medications obtained and verified? Yes  Other? No  Any new allergies since your discharge? No  Dietary orders reviewed? Yes Do you have support at home? Yes   Follow up appointments reviewed:  PCP Hospital f/u appt confirmed?  Patient stated he will call this week.  Scheduled to see  on  @ . Specialist Hospital f/u appt confirmed? Yes  Scheduled to see Sung Amabile. Gwynneth Munson, PA-C on 10/30/2022 @ [facility]. Are transportation arrangements needed? No  If their condition worsens, is the pt aware to call PCP or go to the Emergency Dept.? Yes Was the patient provided with contact information for the PCP's office or ED? Yes Was to pt encouraged to call back with questions or concerns? Yes  Xaria Judon [facility]  [facility] Guide Direct Dial: [phone]  Website: [email or web address]

## 2022-11-16 DIAGNOSIS — R948 Abnormal results of function studies of other organs and systems: Secondary | ICD-10-CM | POA: Diagnosis not present

## 2022-11-16 DIAGNOSIS — E291 Testicular hypofunction: Secondary | ICD-10-CM | POA: Diagnosis not present

## 2022-11-22 ENCOUNTER — Telehealth: Payer: Self-pay | Admitting: Internal Medicine

## 2022-11-22 NOTE — Telephone Encounter (Signed)
Error

## 2022-11-22 NOTE — Telephone Encounter (Signed)
A representative from [employer] called to check on the status. The form was re-faxed and received on 11/09/22. They left a callback number of [phone].

## 2022-11-23 DIAGNOSIS — E291 Testicular hypofunction: Secondary | ICD-10-CM | POA: Diagnosis not present

## 2022-11-24 ENCOUNTER — Other Ambulatory Visit: Payer: Self-pay | Admitting: Internal Medicine

## 2022-12-04 ENCOUNTER — Other Ambulatory Visit: Payer: Self-pay | Admitting: Internal Medicine

## 2022-12-04 DIAGNOSIS — E039 Hypothyroidism, unspecified: Secondary | ICD-10-CM

## 2022-12-05 ENCOUNTER — Other Ambulatory Visit: Payer: Self-pay | Admitting: Internal Medicine

## 2022-12-05 DIAGNOSIS — G4700 Insomnia, unspecified: Secondary | ICD-10-CM

## 2022-12-06 ENCOUNTER — Encounter (HOSPITAL_COMMUNITY): Payer: Self-pay

## 2022-12-06 ENCOUNTER — Emergency Department (HOSPITAL_COMMUNITY)
Admission: EM | Admit: 2022-12-06 | Discharge: 2022-12-06 | Disposition: A | Discharge status: Home / Self Care | Payer: Medicare Other | Attending: Emergency Medicine | Admitting: Emergency Medicine

## 2022-12-06 ENCOUNTER — Other Ambulatory Visit: Payer: Self-pay

## 2022-12-06 ENCOUNTER — Emergency Department (HOSPITAL_COMMUNITY): Payer: Medicare Other

## 2022-12-06 DIAGNOSIS — R001 Bradycardia, unspecified: Secondary | ICD-10-CM | POA: Diagnosis not present

## 2022-12-06 DIAGNOSIS — Z7901 Long term (current) use of anticoagulants: Secondary | ICD-10-CM | POA: Insufficient documentation

## 2022-12-06 DIAGNOSIS — R1030 Lower abdominal pain, unspecified: Secondary | ICD-10-CM | POA: Diagnosis not present

## 2022-12-06 DIAGNOSIS — R1012 Left upper quadrant pain: Secondary | ICD-10-CM | POA: Diagnosis not present

## 2022-12-06 DIAGNOSIS — I10 Essential (primary) hypertension: Secondary | ICD-10-CM | POA: Diagnosis not present

## 2022-12-06 LAB — COMPREHENSIVE METABOLIC PANEL
ALT: 17 U/L (ref 0–44)
AST: 21 U/L (ref 15–41)
Albumin: 3.6 g/dL (ref 3.5–5.0)
Alkaline Phosphatase: 51 U/L (ref 38–126)
Anion gap: 10 (ref 5–15)
BUN: 9 mg/dL (ref 8–23)
CO2: 26 mmol/L (ref 22–32)
Calcium: 9.1 mg/dL (ref 8.9–10.3)
Chloride: 101 mmol/L (ref 98–111)
Creatinine, Ser: 0.85 mg/dL (ref 0.61–1.24)
GFR, Estimated: >60 mL/min (ref >60)
Glucose, Bld: 99 mg/dL (ref 70–99)
Potassium: 3.7 mmol/L (ref 3.5–5.1)
Sodium: 137 mmol/L (ref 135–145)
Total Bilirubin: 0.8 mg/dL (ref 0.3–1.2)
Total Protein: 6.3 g/dL — ABNORMAL LOW (ref 6.5–8.1)

## 2022-12-06 LAB — URINALYSIS, ROUTINE W REFLEX MICROSCOPIC
Bilirubin Urine: NEGATIVE
Glucose, UA: NEGATIVE mg/dL
Hgb urine dipstick: NEGATIVE
Ketones, ur: NEGATIVE mg/dL
Leukocytes,Ua: NEGATIVE
Nitrite: NEGATIVE
Protein, ur: NEGATIVE mg/dL
Specific Gravity, Urine: 1.015 (ref 1.005–1.030)
pH: 7 (ref 5.0–8.0)

## 2022-12-06 LAB — LIPASE, BLOOD: Lipase: 36 U/L (ref 11–51)

## 2022-12-06 LAB — CBC WITH DIFFERENTIAL/PLATELET
Abs Immature Granulocytes: 0.02 10*3/uL (ref 0.00–0.07)
Basophils Absolute: 0 10*3/uL (ref 0.0–0.1)
Basophils Relative: 1 %
Eosinophils Absolute: 0.1 10*3/uL (ref 0.0–0.5)
Eosinophils Relative: 2 %
HCT: 42.6 % (ref 39.0–52.0)
Hemoglobin: 14.6 g/dL (ref 13.0–17.0)
Immature Granulocytes: 0 %
Lymphocytes Relative: 22 %
Lymphs Abs: 1.4 10*3/uL (ref 0.7–4.0)
MCH: 29.8 pg (ref 26.0–34.0)
MCHC: 34.3 g/dL (ref 30.0–36.0)
MCV: 86.9 fL (ref 80.0–100.0)
Monocytes Absolute: 0.7 10*3/uL (ref 0.1–1.0)
Monocytes Relative: 11 %
Neutro Abs: 4 10*3/uL (ref 1.7–7.7)
Neutrophils Relative %: 64 %
Platelets: 286 10*3/uL (ref 150–400)
RBC: 4.9 MIL/uL (ref 4.22–5.81)
RDW: 12.4 % (ref 11.5–15.5)
WBC: 6.3 10*3/uL (ref 4.0–10.5)
nRBC: 0 % (ref 0.0–0.2)

## 2022-12-06 MED ORDER — LIDOCAINE VISCOUS HCL 2 % MT SOLN
15.0000 mL | Freq: Once | OROMUCOSAL | Status: AC
  Administered 2022-12-06: 15 mL via ORAL
  Filled 2022-12-06: qty 15

## 2022-12-06 MED ORDER — IOHEXOL 350 MG/ML SOLN
75.0000 mL | Freq: Once | INTRAVENOUS | Status: AC | PRN
  Administered 2022-12-06: 75 mL via INTRAVENOUS

## 2022-12-06 MED ORDER — ALUM & MAG HYDROXIDE-SIMETH 200-200-20 MG/5ML PO SUSP
30.0000 mL | Freq: Once | ORAL | Status: AC
  Administered 2022-12-06: 30 mL via ORAL
  Filled 2022-12-06: qty 30

## 2022-12-06 MED ORDER — FAMOTIDINE 20 MG PO TABS
20.0000 mg | ORAL_TABLET | Freq: Once | ORAL | Status: AC
  Administered 2022-12-06: 20 mg via ORAL
  Filled 2022-12-06: qty 1

## 2022-12-06 MED ORDER — FAMOTIDINE 20 MG PO TABS: 20.0000 mg | ORAL_TABLET | Freq: Every day | ORAL | 0 refills | Status: DC

## 2022-12-06 MED ORDER — PANTOPRAZOLE SODIUM 20 MG PO TBEC: 20.0000 mg | DELAYED_RELEASE_TABLET | Freq: Every day | ORAL | 0 refills | Status: DC

## 2022-12-06 NOTE — Discharge Instructions (Addendum)
Please follow-up with your primary care provider regarding your abdominal discomfort, your EKG, laboratory evaluation and CT imaging was overall reassuring today.  Can trial a course of Pepcid and Protonix as symptoms could be due to a peptic ulcer.

## 2022-12-06 NOTE — ED Provider Notes (Addendum)
[facility] EMERGENCY DEPARTMENT AT [facility] Provider Note   CSN: [id number] Arrival date & time: 12/06/22  0847     History  Chief Complaint  Patient presents with   Abdominal Pain    Jonathan Powers is a 72 y.o. male.   Abdominal Pain    72 year old male with medical history significant for HTN, GERD, obesity, BPH, OSA, superior mesenteric artery aneurysm, pancreatic insufficiency, presenting to the emergency department with a chief complaint of left upper quadrant pain.  The patient states that the pain has been ongoing for the past 18 hours.  He endorses a mild discomfort in the left upper quadrant of his abdomen.  He endorses some epigastric discomfort.  No unintentional weight loss, fevers, chills, night sweats.  He denies any nausea or vomiting.  He states that he is status post cholecystectomy.  No genitourinary complaints, no flank pain.  He states that he is uncertain if he is passing gas today.  He has noticed that his most recent bowel movement was diminished compared to previous.  He had had some left upper quadrant pain and was seen by his PCP back in August of this year and it had been thought that it might be due to mild constipation.  Home Medications Prior to Admission medications   Medication Sig Start Date End Date Taking? Authorizing Provider  cetirizine (ZYRTEC) 10 MG tablet Take 10 mg by mouth daily as needed for allergies.   Yes [provider]  famotidine (PEPCID) 20 MG tablet Take 1 tablet (20 mg total) by mouth daily. 12/06/22  Yes Ernie Avena, MD  hydrOXYzine (ATARAX) 25 MG tablet Take 25 mg by mouth daily. 12/04/22  Yes [provider]  levothyroxine (SYNTHROID) 175 MCG tablet TAKE 1 TABLET BY MOUTH ONCE DAILY BEFORE BREAKFAST 12/04/22  Yes [name], MD  memantine (NAMENDA) 5 MG tablet Take 5 mg by mouth 2 (two) times daily. 11/20/22  Yes [provider]  pantoprazole (PROTONIX) 20 MG tablet Take 1 tablet  (20 mg total) by mouth daily. 12/06/22 01/05/23 Yes Ernie Avena, MD  Testosterone 1.62 % GEL Apply 40.5 mg topically in the morning. 11/26/20  Yes [provider]  traZODone (DESYREL) 100 MG tablet TAKE 1 TABLET BY MOUTH AT BEDTIME AS NEEDED FOR SLEEP 12/05/22  Yes [name], MD  apixaban (ELIQUIS) 2.5 MG TABS tablet Take 1 tablet (2.5 mg total) by mouth 2 (two) times daily. 03/07/19 05/16/19  Allena Katz, PA-C      Allergies    Nsaids and Tolmetin    Review of Systems   Review of Systems  Gastrointestinal:  Positive for abdominal pain.  All other systems reviewed and are negative.   Physical Exam Updated Vital Signs BP (!) 131/92 (BP Location: Right Arm)   Pulse (!) 58   Temp 98.7 F (37.1 C) (Oral)   Resp 19   Ht 5\' 10"  (1.778 m)   Wt 90.7 kg   SpO2 99%   BMI 28.70 kg/m  Physical Exam Vitals and nursing note reviewed.  Constitutional:      General: He is not in acute distress.    Appearance: He is well-developed.  HENT:     Head: Normocephalic and atraumatic.  Eyes:     Conjunctiva/sclera: Conjunctivae normal.  Cardiovascular:     Rate and Rhythm: Normal rate and regular rhythm.  Pulmonary:     Effort: Pulmonary effort is normal. No respiratory distress.     Breath sounds:  Normal breath sounds.  Abdominal:     Palpations: Abdomen is soft.     Tenderness: There is abdominal tenderness in the right lower quadrant, epigastric area and left upper quadrant. There is no guarding or rebound.  Musculoskeletal:        General: No swelling.     Cervical back: Neck supple.  Skin:    General: Skin is warm and dry.     Capillary Refill: Capillary refill takes less than 2 seconds.  Neurological:     Mental Status: He is alert.  Psychiatric:        Mood and Affect: Mood normal.     ED Results / Procedures / Treatments   Labs (all labs ordered are listed, but only abnormal results are displayed) Labs Reviewed  COMPREHENSIVE METABOLIC PANEL -  Abnormal; Notable for the following components:      Result Value   Total Protein 6.3 (*)    All other components within normal limits  CBC WITH DIFFERENTIAL/PLATELET  LIPASE, BLOOD  URINALYSIS, ROUTINE W REFLEX MICROSCOPIC    EKG EKG Interpretation Date/Time:  Wednesday December 06 2022 11:09:53 EDT Ventricular Rate:  52 PR Interval:  122 QRS Duration:  88 QT Interval:  456 QTC Calculation: 425 R Axis:   45  Text Interpretation: Sinus bradycardia No significant change since last tracing Confirmed by Ernie Avena ([id number]) on 12/06/2022 11:47:20 AM  Radiology No results found.  Procedures Procedures    Medications Ordered in ED Medications  iohexol (OMNIPAQUE) 350 MG/ML injection 75 mL (75 mLs Intravenous Contrast Given 12/06/22 1418)  alum & mag hydroxide-simeth (MAALOX/MYLANTA) 200-200-20 MG/5ML suspension 30 mL (30 mLs Oral Given 12/06/22 1634)    And  lidocaine (XYLOCAINE) 2 % viscous mouth solution 15 mL (15 mLs Oral Given 12/06/22 1634)  famotidine (PEPCID) tablet 20 mg (20 mg Oral Given 12/06/22 1634)    ED Course/ Medical Decision Making/ A&P                                  Medical Decision Making Amount and/or Complexity of Data Reviewed Labs: ordered. Radiology: ordered.  Risk OTC drugs. Prescription drug management.     72 year old male with medical history significant for HTN, GERD, obesity, BPH, OSA, superior mesenteric artery aneurysm, pancreatic insufficiency, presenting to the emergency department with a chief complaint of left upper quadrant pain.  The patient states that the pain has been ongoing for the past 18 hours.  He endorses a mild discomfort in the left upper quadrant of his abdomen.  He endorses some epigastric discomfort.  No unintentional weight loss, fevers, chills, night sweats.  He denies any nausea or vomiting.  He states that he is status post cholecystectomy.  No genitourinary complaints, no flank pain.  He states that he is  uncertain if he is passing gas today.  He has noticed that his most recent bowel movement was diminished compared to previous.  He had had some left upper quadrant pain and was seen by his PCP back in August of this year and it had been thought that it might be due to mild constipation.  On arrival, the patient was afebrile, not tachycardic or tachypneic, hemodynamically stable, saturating well on room air.  Sinus rhythm noted on cardiac telemetry.  Physical exam revealed left upper quadrant tenderness, some focality in the right lower quadrant.  Differential diagnosis includes appendicitis, bowel obstruction, constipation, also considered pancreatitis, peptic ulcer  disease, gastritis.  EKG revealed sinus bradycardia, ventricular rate 5 2, no acute ischemic changes, no STEMI.  Laboratory evaluation revealed CMP unremarkable, lipase normal, CBC unremarkable, UA negative.  CT abdomen pelvis was performed and results were pending at time of signout.   CT Abd Pel: FINDINGS:  Lower chest: Remote left rib fractures.  No acute abnormality.    Hepatobiliary: Cholecystectomy. Unremarkable liver and biliary tree.    Pancreas: Unremarkable.    Spleen: Unremarkable.    Adrenals/Urinary Tract: Stable adrenal glands and kidneys. No  urinary calculi or hydronephrosis. The bladder is obscured by streak  artifact.    Stomach/Bowel: Normal caliber large and small bowel without bowel  wall thickening. Stomach and appendix are within normal limits.    Vascular/Lymphatic: Aortic atherosclerosis. No enlarged abdominal or  pelvic lymph nodes.    Reproductive: Obscured by streak artifact.    Other: No free intraperitoneal fluid or air.    Musculoskeletal: Bilateral THAs.  No acute fracture.    IMPRESSION:  1. No acute abnormality in the abdomen or pelvis.    Aortic Atherosclerosis (ICD10-I70.0).    CT and labs reassuring. Pt given a GI cocktail. Minimal discomfort today. Low concern for ACS, PE,  other acute intrathoracic or intraabdominal abnormality. Considered gastritis or GERD or PUD. Will trial a course of Pepcid and Protonix and have the patient follow-up outpatient. Stable for DC  Final Clinical Impression(s) / ED Diagnoses Final diagnoses:  LUQ abdominal pain    Rx / DC Orders ED Discharge Orders          Ordered    famotidine (PEPCID) 20 MG tablet  Daily        12/06/22 1642    pantoprazole (PROTONIX) 20 MG tablet  Daily        12/06/22 1642              Ernie Avena, MD 12/06/22 1634    Ernie Avena, MD 12/09/22 [phone]

## 2022-12-06 NOTE — ED Triage Notes (Signed)
Pt to ED pov from home. Pt c/o LLQ abdominal pain that started this am. Pt states he has also had narrowing of his stool for past few months. Pt denies nausea, vomiting, diarrhea, or constipation.

## 2022-12-13 ENCOUNTER — Telehealth: Payer: Self-pay | Admitting: Physician Assistant

## 2022-12-13 NOTE — Telephone Encounter (Signed)
I have refaxed again for the 4th time, with successful fax

## 2022-12-13 NOTE — Telephone Encounter (Signed)
[employer] rep called in wanting to see if we have an order for back brace and an ankle brace?

## 2022-12-25 ENCOUNTER — Telehealth: Payer: Self-pay | Admitting: Physician Assistant

## 2022-12-25 NOTE — Telephone Encounter (Signed)
Void the orders, he has no need for this, if they call back ask them to contact patient. Not [place] thank you. He thanked me for calling.

## 2022-12-25 NOTE — Telephone Encounter (Signed)
Medical  supply called regarding a script for Back and ankle braces. Please call back [phone]

## 2022-12-25 NOTE — Telephone Encounter (Signed)
Multple faxes, will send it again. Or patient can pick up

## 2023-01-04 DIAGNOSIS — E291 Testicular hypofunction: Secondary | ICD-10-CM | POA: Diagnosis not present

## 2023-01-09 ENCOUNTER — Ambulatory Visit (INDEPENDENT_AMBULATORY_CARE_PROVIDER_SITE_OTHER): Payer: Medicare Other | Admitting: Gastroenterology

## 2023-01-09 ENCOUNTER — Encounter: Payer: Self-pay | Admitting: Gastroenterology

## 2023-01-09 VITALS — BP 110/60 | HR 57 | Ht 68.75 in | Wt 248.0 lb

## 2023-01-09 DIAGNOSIS — R194 Change in bowel habit: Secondary | ICD-10-CM

## 2023-01-09 DIAGNOSIS — R1012 Left upper quadrant pain: Secondary | ICD-10-CM

## 2023-01-09 MED ORDER — DICYCLOMINE HCL 10 MG PO CAPS: 10.0000 mg | ORAL_CAPSULE | Freq: Three times a day (TID) | ORAL | 11 refills | Status: DC

## 2023-01-09 NOTE — Patient Instructions (Signed)
We have sent the following medications to your pharmacy for you to pick up at your convenience: dicyclomine.   Please purchase the following medications over the counter and take as directed: Gas-x three times a day as needed.  You have been given a low gas diet.   Call our office if your symptoms have not improved with these regimens.  The [facility] providers would like to encourage you to use [facility] to communicate with providers for non-urgent requests or questions.  Due to long hold times on the telephone, sending your provider a message by [facility] may be a faster and more efficient way to get a response.  Please allow 48 business hours for a response.  Please remember that this is for non-urgent requests.   Thank you for choosing me and [facility].  Venita Lick. Pleas Koch., MD., Clementeen Graham

## 2023-01-09 NOTE — Progress Notes (Signed)
Assessment     Suspected IBS-A Likely internal hemorrhoid causing intermittent small-volume rectal bleeding Personal history of adenomatous colon polyps   Recommendations    Begin dicyclomine 10 mg tid prn, Gas-X 3 tid prn and a low gas diet.  Contact us in the next 2 to 3 weeks if all symptoms not resolved or significantly improved. Surveillance colonoscopy recommended in January 2027   HPI    This is a 72 year old male with intermittent LUQ pain, alternating loose stools with normal stools, increased flatulence, slight decrease in stool caliber for a few months.  He is unaccompanied.  He has mild cognitive impairment with mild memory deficits.  He notes a couple episode of small volume rectal bleeding with bowel movements.  He was evaluated in the ED on October 16 for the above symptoms.  Blood work and CTAP were unremarkable.  He cannot discern any specific foods or beverages that exacerbate symptoms.  Colonoscopy Jan 2024 - Three 5 to 8 mm polyps in the descending colon, in the ascending colon and at the ileocecal valve, removed with a cold snare. Resected and retrieved.  - Mild diverticulosis in the left colon.   Path: 3 TAs  Labs / Imaging       Latest Ref Rng & Units 12/06/2022    9:53 AM 10/22/2022    4:09 PM 10/22/2022   12:10 AM  Hepatic Function  Total Protein 6.5 - 8.1 g/dL 6.3  7.1  5.8   Albumin 3.5 - 5.0 g/dL 3.6  4.1  3.9   AST 15 - 41 U/L 21  20  21    ALT 0 - 44 U/L 17  17  20    Alk Phosphatase 38 - 126 U/L 51  52  57   Total Bilirubin 0.3 - 1.2 mg/dL 0.8  1.3  1.0        Latest Ref Rng & Units 12/06/2022    9:53 AM 10/22/2022    4:09 PM 10/22/2022   12:10 AM  CBC  WBC 4.0 - 10.5 K/uL 6.3  6.9  9.5   Hemoglobin 13.0 - 17.0 g/dL 43.3  29.5  18.8   Hematocrit 39.0 - 52.0 % 42.6  48.7  48.8   Platelets 150 - 400 K/uL 286  251  275      CT ABDOMEN PELVIS W CONTRAST CLINICAL DATA:  Lower abdominal pain and decreased stool caliber.  EXAM: CT ABDOMEN AND  PELVIS WITH CONTRAST  TECHNIQUE: Multidetector CT imaging of the abdomen and pelvis was performed using the standard protocol following bolus administration of intravenous contrast.  RADIATION DOSE REDUCTION: This exam was performed according to the departmental dose-optimization program which includes automated exposure control, adjustment of the mA and/or kV according to patient size and/or use of iterative reconstruction technique.  CONTRAST:  75mL OMNIPAQUE IOHEXOL 350 MG/ML SOLN  COMPARISON:  CT 10/22/2022  FINDINGS: Lower chest: Remote left rib fractures.  No acute abnormality.  Hepatobiliary: Cholecystectomy. Unremarkable liver and biliary tree.  Pancreas: Unremarkable.  Spleen: Unremarkable.  Adrenals/Urinary Tract: Stable adrenal glands and kidneys. No urinary calculi or hydronephrosis. The bladder is obscured by streak artifact.  Stomach/Bowel: Normal caliber large and small bowel without bowel wall thickening. Stomach and appendix are within normal limits.  Vascular/Lymphatic: Aortic atherosclerosis. No enlarged abdominal or pelvic lymph nodes.  Reproductive: Obscured by streak artifact.  Other: No free intraperitoneal fluid or air.  Musculoskeletal: Bilateral THAs.  No acute fracture.  IMPRESSION: 1. No acute abnormality in  the abdomen or pelvis.  Aortic Atherosclerosis (ICD10-I70.0).  Electronically Signed   By: Minerva Fester M.D.   On: 12/06/2022 16:19   Current Medications, Allergies, Past Medical History, Past Surgical History, Family History and Social History were reviewed in [employer] record.   Physical Exam: General: Well developed, well nourished, no acute distress Head: Normocephalic and atraumatic Eyes: Sclerae anicteric, EOMI Ears: Normal auditory acuity Mouth: No deformities or lesions noted Lungs: Clear throughout to auscultation Heart: Regular rate and rhythm; No murmurs, rubs or bruits Abdomen: Soft,  non tender and non distended. No masses, hepatosplenomegaly or hernias noted. Normal Bowel sounds Rectal: Not done Musculoskeletal: Symmetrical with no gross deformities  Pulses:  Normal pulses noted Extremities: No edema or deformities noted Neurological: Alert oriented x 4, grossly nonfocal Psychological:  Alert and cooperative. Normal mood and affect   Maor Meckel T. Russella Dar, MD 01/09/2023, 10:00 AM

## 2023-01-10 ENCOUNTER — Telehealth: Payer: Self-pay

## 2023-01-10 NOTE — Telephone Encounter (Signed)
Pharmacy Patient Advocate Encounter   Received notification from CoverMyMeds that prior authorization for Dicyclomine 10 mg capsule is required/requested.   Insurance verification completed.   The patient is insured through [employer] .   Per test claim: PA required; PA submitted to above mentioned insurance via CoverMyMeds Key/confirmation #/EOC [id number] Status is pending

## 2023-01-11 ENCOUNTER — Other Ambulatory Visit (HOSPITAL_COMMUNITY): Payer: Self-pay

## 2023-01-11 NOTE — Telephone Encounter (Signed)
Pharmacy Patient Advocate Encounter  Received notification from [facility] that Prior Authorization for Dicyclomine 10mg  Caps has been APPROVED from 01/10/23 to 02/20/24. Ran test claim, Copay is $15.24. This test claim was processed through [facility]- copay amounts may vary at other pharmacies due to pharmacy/plan contracts, or as the patient moves through the different stages of their insurance plan.

## 2023-01-16 ENCOUNTER — Ambulatory Visit (INDEPENDENT_AMBULATORY_CARE_PROVIDER_SITE_OTHER): Payer: Medicare Other | Admitting: Internal Medicine

## 2023-01-16 ENCOUNTER — Encounter: Payer: Self-pay | Admitting: Internal Medicine

## 2023-01-16 VITALS — BP 122/74 | HR 62 | Temp 98.5°F | Ht 68.75 in | Wt 246.8 lb

## 2023-01-16 DIAGNOSIS — Z23 Encounter for immunization: Secondary | ICD-10-CM | POA: Diagnosis not present

## 2023-01-16 DIAGNOSIS — E039 Hypothyroidism, unspecified: Secondary | ICD-10-CM

## 2023-01-16 DIAGNOSIS — I10 Essential (primary) hypertension: Secondary | ICD-10-CM | POA: Diagnosis not present

## 2023-01-16 DIAGNOSIS — R5383 Other fatigue: Secondary | ICD-10-CM | POA: Diagnosis not present

## 2023-01-16 MED ORDER — SHINGRIX 50 MCG/0.5ML IM SUSR
0.5000 mL | Freq: Once | INTRAMUSCULAR | 1 refills | Status: AC
Start: 2023-01-16 — End: 2023-01-16

## 2023-01-16 NOTE — Patient Instructions (Signed)

## 2023-01-16 NOTE — Progress Notes (Signed)
Subjective:  Patient ID: Jonathan Powers, male    DOB: 1950/11/20  Age: 72 y.o. MRN: [id number]  CC: Hypothyroidism, Hypertension, and Hyperlipidemia   HPI Jonathan Powers presents for f/up ---  Discussed the use of AI scribe software for clinical note transcription with the patient, who gave verbal consent to proceed.  History of Present Illness   The patient presents for a routine follow-up. He is currently on memantine for neurologic symptoms, Olmesartan for hypertension, and rosuvastatin for cardiovascular risk reduction. The patient's daughter, who helps manage his medications, reports that the patient's short-term memory is not optimal. The patient denies any symptoms of low or high blood pressure such as dizziness, lightheadedness, headache, or blurred vision.  The patient also reports a fall that occurred several years ago while hiking, resulting in a head injury and loss of consciousness for a few minutes. He notes a perceived decrease in brain function since the incident but maintains independence in daily activities.  The patient is active, engaging in biking and camping activities with a friend. He denies any symptoms of thyroid dysfunction such as constipation, weight gain, weight loss, fatigue, or swelling in the legs or feet. The patient's blood pressure was noted to be slightly low during the visit.       Outpatient Medications Prior to Visit  Medication Sig Dispense Refill   cetirizine (ZYRTEC) 10 MG tablet Take 10 mg by mouth daily as needed for allergies.     memantine (NAMENDA) 5 MG tablet Take 10 mg by mouth daily.     Testosterone 1.62 % GEL Apply 40.5 mg topically in the morning.     traZODone (DESYREL) 100 MG tablet TAKE 1 TABLET BY MOUTH AT BEDTIME AS NEEDED FOR SLEEP 90 tablet 0   levothyroxine (SYNTHROID) 175 MCG tablet TAKE 1 TABLET BY MOUTH ONCE DAILY BEFORE BREAKFAST 90 tablet 0   dicyclomine (BENTYL) 10 MG capsule Take 1 capsule (10 mg total) by mouth 3  (three) times daily before meals. (Patient not taking: Reported on 01/16/2023) 90 capsule 11   No facility-administered medications prior to visit.    ROS Review of Systems  Constitutional:  Positive for fatigue. Negative for chills, diaphoresis and fever.  HENT: Negative.    Eyes:  Negative for visual disturbance.  Respiratory:  Negative for cough, chest tightness, shortness of breath and wheezing.   Cardiovascular:  Negative for chest pain, palpitations and leg swelling.  Gastrointestinal: Negative.  Negative for abdominal pain, diarrhea, nausea and vomiting.  Endocrine: Negative.   Genitourinary: Negative.  Negative for difficulty urinating.  Musculoskeletal: Negative.   Skin: Negative.   Neurological: Negative.  Negative for dizziness.  Hematological:  Negative for adenopathy. Does not bruise/bleed easily.  Psychiatric/Behavioral:  Positive for confusion and decreased concentration. Negative for self-injury. The patient is not nervous/anxious.     Objective:  BP 122/74 (BP Location: Left Arm, Patient Position: Sitting, Cuff Size: Normal)   Pulse 62   Temp 98.5 F (36.9 C) (Oral)   Ht 5' 8.75" (1.746 m)   Wt 246 lb 12.8 oz (111.9 kg)   SpO2 97%   BMI 36.71 kg/m   BP Readings from Last 3 Encounters:  01/16/23 122/74  01/09/23 110/60  12/06/22 (!) 131/92    Wt Readings from Last 3 Encounters:  01/16/23 246 lb 12.8 oz (111.9 kg)  01/09/23 248 lb (112.5 kg)  12/06/22 200 lb (90.7 kg)    Physical Exam Vitals reviewed.  Constitutional:      Appearance:  Normal appearance.  HENT:     Mouth/Throat:     Mouth: Mucous membranes are moist.  Eyes:     General: No scleral icterus.    Conjunctiva/sclera: Conjunctivae normal.  Cardiovascular:     Rate and Rhythm: Normal rate and regular rhythm.     Heart sounds: No murmur heard.    No friction rub. No gallop.  Pulmonary:     Effort: Pulmonary effort is normal.     Breath sounds: No stridor. No wheezing, rhonchi or  rales.  Abdominal:     General: Abdomen is flat.     Palpations: There is no mass.     Tenderness: There is no abdominal tenderness. There is no guarding.     Hernia: No hernia is present.  Musculoskeletal:        General: Normal range of motion.     Cervical back: Neck supple.     Right lower leg: No edema.     Left lower leg: No edema.  Lymphadenopathy:     Cervical: No cervical adenopathy.  Skin:    General: Skin is warm and dry.  Neurological:     General: No focal deficit present.     Mental Status: He is alert. Mental status is at baseline.  Psychiatric:        Mood and Affect: Mood normal.        Behavior: Behavior normal.     Lab Results  Component Value Date   WBC 6.3 12/06/2022   HGB 14.6 12/06/2022   HCT 42.6 12/06/2022   PLT 286 12/06/2022   GLUCOSE 99 12/06/2022   CHOL 174 08/02/2022   TRIG 177.0 (H) 08/02/2022   HDL 48.20 08/02/2022   LDLCALC 90 08/02/2022   ALT 17 12/06/2022   AST 21 12/06/2022   NA 137 12/06/2022   K 3.7 12/06/2022   CL 101 12/06/2022   CREATININE 0.85 12/06/2022   BUN 9 12/06/2022   CO2 26 12/06/2022   TSH 0.63 01/16/2023   PSA 1.48 08/02/2022   INR 1.1 08/08/2022   HGBA1C 5.3 09/08/2021    CT ABDOMEN PELVIS W CONTRAST  Result Date: 12/06/2022 CLINICAL DATA:  Lower abdominal pain and decreased stool caliber. EXAM: CT ABDOMEN AND PELVIS WITH CONTRAST TECHNIQUE: Multidetector CT imaging of the abdomen and pelvis was performed using the standard protocol following bolus administration of intravenous contrast. RADIATION DOSE REDUCTION: This exam was performed according to the departmental dose-optimization program which includes automated exposure control, adjustment of the mA and/or kV according to patient size and/or use of iterative reconstruction technique. CONTRAST:  75mL OMNIPAQUE IOHEXOL 350 MG/ML SOLN COMPARISON:  CT 10/22/2022 FINDINGS: Lower chest: Remote left rib fractures.  No acute abnormality. Hepatobiliary:  Cholecystectomy. Unremarkable liver and biliary tree. Pancreas: Unremarkable. Spleen: Unremarkable. Adrenals/Urinary Tract: Stable adrenal glands and kidneys. No urinary calculi or hydronephrosis. The bladder is obscured by streak artifact. Stomach/Bowel: Normal caliber large and small bowel without bowel wall thickening. Stomach and appendix are within normal limits. Vascular/Lymphatic: Aortic atherosclerosis. No enlarged abdominal or pelvic lymph nodes. Reproductive: Obscured by streak artifact. Other: No free intraperitoneal fluid or air. Musculoskeletal: Bilateral THAs.  No acute fracture. IMPRESSION: 1. No acute abnormality in the abdomen or pelvis. Aortic Atherosclerosis (ICD10-I70.0). Electronically Signed   By: Minerva Fester M.D.   On: 12/06/2022 16:19    Assessment & Plan:   Essential hypertension- His BP is well controlled. -     TSH; Future  Need for prophylactic vaccination and inoculation against  varicella -     Shingrix; Inject 0.5 mLs into the muscle once for 1 dose.  Dispense: 0.5 mL; Refill: 1  Acquired hypothyroidism- He is euthyroid. -     TSH; Future -     Levothyroxine Sodium; Take 1 tablet (175 mcg total) by mouth daily before breakfast.  Dispense: 90 tablet; Refill: 1  Immunization due -     Pneumococcal polysaccharide vaccine 23-valent greater than or equal to 2yo subcutaneous/IM  Other fatigue- T is normal -     Testosterone Total,Free,Bio, Males; Future  Other orders -     TSH -     TEST AUTHORIZATION     Follow-up: Return in about 6 months (around 07/16/2023).  Sanda Linger, MD

## 2023-01-18 LAB — TESTOSTERONE TOTAL,FREE,BIO, MALES
Albumin: 4.4 g/dL (ref 3.6–5.1)
Sex Hormone Binding: 45 nmol/L (ref 22–77)
Testosterone, Bioavailable: 84.4 ng/dL (ref 15.0–150.0)
Testosterone, Free: 41.9 pg/mL (ref 6.0–73.0)
Testosterone: 414 ng/dL (ref 250–827)

## 2023-01-18 LAB — TEST AUTHORIZATION

## 2023-01-18 LAB — TSH: TSH: 0.63 m[IU]/L (ref 0.40–4.50)

## 2023-01-19 MED ORDER — LEVOTHYROXINE SODIUM 175 MCG PO TABS: 175.0000 ug | ORAL_TABLET | Freq: Every day | ORAL | 1 refills | Status: DC

## 2023-01-20 ENCOUNTER — Encounter: Payer: Self-pay | Admitting: Internal Medicine

## 2023-01-22 ENCOUNTER — Other Ambulatory Visit: Payer: Self-pay | Admitting: Internal Medicine

## 2023-01-22 DIAGNOSIS — E785 Hyperlipidemia, unspecified: Secondary | ICD-10-CM | POA: Insufficient documentation

## 2023-01-22 MED ORDER — ROSUVASTATIN CALCIUM 5 MG PO TABS: 5.0000 mg | ORAL_TABLET | Freq: Every day | ORAL | 1 refills | Status: DC

## 2023-01-29 ENCOUNTER — Telehealth: Payer: Self-pay | Admitting: Orthopedic Surgery

## 2023-01-29 NOTE — Telephone Encounter (Signed)
I called pt and appt is sch for tomorrow with Dr. Lajoyce Corners at 10 am

## 2023-01-29 NOTE — Telephone Encounter (Signed)
Pt came into office asking to be seen by Lajoyce Corners or Erin for right foot possible infection. Informed pt no one is here and I will ask Autumn F to call pt about issue. Pt phone number is [phone].

## 2023-01-30 ENCOUNTER — Encounter: Payer: Self-pay | Admitting: Orthopedic Surgery

## 2023-01-30 ENCOUNTER — Ambulatory Visit (INDEPENDENT_AMBULATORY_CARE_PROVIDER_SITE_OTHER): Payer: Medicare Other | Admitting: Orthopedic Surgery

## 2023-01-30 DIAGNOSIS — B353 Tinea pedis: Secondary | ICD-10-CM | POA: Diagnosis not present

## 2023-01-30 NOTE — Progress Notes (Signed)
Office Visit Note   Patient: Jonathan Powers           Date of Birth: 1950/04/17           MRN: [id number] Visit Date: 01/30/2023              Requested by: [name], MD [address] [place],  [place] [address] PCP: [name], MD  Chief Complaint  Patient presents with   Right Foot - Pain      HPI: Patient is a 72 year old gentleman who presents with fungal ulceration webspace of both feet.  Patient states that he had a fall hiking a year ago struck his head and has been having progressive mental status changes since this blunt trauma.  Assessment & Plan: Visit Diagnoses:  1. Tinea pedis of both feet     Plan: Patient was provided strips of the Vive wear sock to wrap between his toes he is to wear this daily discard the used sock pieces.  Reevaluate in 4 weeks.  Follow-Up Instructions: Return in about 4 weeks (around 02/27/2023).   Ortho Exam  Patient is alert, oriented, no adenopathy, well-dressed, normal affect, normal respiratory effort. Examination patient has good pulses bilaterally.  He has a blistered fungal rash in the right third webspace and the left third and fourth webspace.  There is no exposed bone or tendon.  There is no cellulitis.  There is macerated peeling skin this was gently removed.  The webspaces were packed open with pieces of the Kerecis sock.  Imaging: No results found. No images are attached to the encounter.  Labs: Lab Results  Component Value Date   HGBA1C 5.3 09/08/2021   HGBA1C 5.8 02/09/2021   HGBA1C 5.8 06/29/2020   ESRSEDRATE 38 (H) 04/23/2022   ESRSEDRATE 86 (H) 09/11/2021   ESRSEDRATE 9 07/09/2018   CRP 2.3 (H) 04/23/2022   CRP 1.2 09/15/2021   CRP 4.2 (H) 09/11/2021   LABURIC 9.0 (H) 11/28/2019   LABURIC 8.1 (H) 07/09/2018   LABURIC 6.7 02/05/2018   REPTSTATUS 08/13/2022 FINAL 08/08/2022   CULT  08/08/2022    NO GROWTH 5 DAYS Performed at [facility], [address], [place], [place] [address]       Lab Results  Component Value Date   ALBUMIN 3.6 12/06/2022   ALBUMIN 4.1 10/22/2022   ALBUMIN 3.9 10/22/2022    Lab Results  Component Value Date   MG 2.0 09/11/2021   MG 1.9 09/10/2021   MG 1.9 09/08/2021   Lab Results  Component Value Date   VD25OH 55 07/09/2018   VD25OH 54 04/04/2018   VD25OH 46 03/01/2016    No results found for: "PREALBUMIN"    Latest Ref Rng & Units 12/06/2022    9:53 AM 10/22/2022    4:09 PM 10/22/2022   12:10 AM  CBC EXTENDED  WBC 4.0 - 10.5 K/uL 6.3  6.9  9.5   RBC 4.22 - 5.81 MIL/uL 4.90  5.47  5.50   Hemoglobin 13.0 - 17.0 g/dL 28.4  13.2  44.0   HCT 39.0 - 52.0 % 42.6  48.7  48.8   Platelets 150 - 400 K/uL 286  251  275   NEUT# 1.7 - 7.7 K/uL 4.0     Lymph# 0.7 - 4.0 K/uL 1.4        There is no height or weight on file to calculate BMI.  Orders:  No orders of the defined types were placed in  this encounter.  No orders of the defined types were placed in this encounter.    Procedures: No procedures performed  Clinical Data: No additional findings.  ROS:  All other systems negative, except as noted in the HPI. Review of Systems  Objective: Vital Signs: There were no vitals taken for this visit.  Specialty Comments:  No specialty comments available.  PMFS History: Patient Active Problem List   Diagnosis Date Noted   Dyslipidemia, goal LDL below 100 01/22/2023   Need for prophylactic vaccination and inoculation against varicella 01/16/2023   Mild neurocognitive disorder due to Alzheimer's disease 09/20/2022   Exocrine pancreatic insufficiency 12/03/2021   Lung nodule 07/25/2021   Hypothyroidism 11/11/2018   Insomnia 11/10/2018   GERD (gastroesophageal reflux disease) 11/10/2018   Age related osteoporosis 03/11/2018   Flexural eczema 07/24/2017   Chronic right-sided low back pain without sciatica 07/21/2016   Superior mesenteric artery aneurysm 07/12/2015   Left thyroid nodule    OSA (obstructive sleep apnea)  01/27/2013   Symptomatic PVCs 09/06/2012   BPH (benign prostatic hyperplasia) 09/06/2012   Obesity (BMI 30-39.9) 05/17/2006   Essential hypertension 05/17/2006   Allergic rhinitis 05/17/2006   Primary osteoarthritis 05/17/2006   Past Medical History:  Diagnosis Date   Acute recurrent pansinusitis 02/17/2021   Age related osteoporosis 03/11/2018   Allergic rhinitis 05/17/2006   BPH (benign prostatic hyperplasia) 09/06/2012   Cellulitis of left lower extremity 11/10/2018   Cellulitis, right leg    Recurrent R hip cellulitis 1/08,3/08    Chronic fatigue, unspecified    Chronic hyperglycemia 06/29/2020   Chronic right-sided low back pain without sciatica 07/21/2016   Chronic sinusitis 07/10/2021   Deviated septum 02/17/2021   Diverticulitis    Essential hypertension 05/17/2006   2014     Impression  Exercise Capacity:  Lexiscan with low level exercise.  BP Response:  Normal blood pressure response.  Clinical Symptoms:  No significant symptoms noted.  ECG Impression:  No significant ST segment change suggestive of ischemia.  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   Flexural eczema 07/24/2017   Generalized abdominal tenderness with rebound tenderness 08/02/2022   GERD (gastroesophageal reflux disease)    Gout    Hashimoto's thyroiditis    History of skin cancer    Melanoma on back   Hypogonadism male    low T, a/w ED   Hypothyroidism 11/11/2018   Idiopathic urticaria    Insomnia 11/10/2018   Left flank pain 08/02/2022   Left thyroid nodule 2008   on [place], decrease size in 2011 [place]   Lung nodule 07/25/2021   Migraines    Atypical and ocular   Mild neurocognitive disorder due to Alzheimer's disease 09/20/2022   Nasal turbinate hypertrophy 02/17/2021   Obesity (BMI 30-39.9)  05/17/2006   OSA (obstructive sleep apnea) 01/27/2013   HST 01/2013:  AHI 68/hr with obstructive and central events.   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 No acute findings.  2. 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  3. Ectatic abdominal aorta at risk for aneurysm development.  Recommend followup by ultrasound in 5 years. This recommendation  follows ACR consensus guidelines: White Paper of the ACR Incidental  Findings Committee II on Vascular Findings. J Am Coll Radiol 2013;  10:789-794.  4. 1.   Symptomatic PVCs 09/06/2012    Family History  Problem Relation Age of Onset   Arthritis Father    Heart disease Father    Diabetes Father    Vascular Disease Father        AoBifem   Arthritis Mother    Diabetes Mother    Multiple sclerosis Mother    Cancer Paternal Grandmother        "GI"   Cancer Paternal Grandfather        stomach cancer   Thyroid cancer Sister    Uterine cancer Sister     Past Surgical History:  Procedure Laterality Date   CARPAL TUNNEL RELEASE Left 03/30/2017   Procedure: CARPAL TUNNEL RELEASE;  Surgeon: Valeria Batman, MD;  Location: [facility];  Service: Orthopedics;  Laterality: Left;   CHOLECYSTECTOMY  2004   CTR Bilateral 1982   CYSTOSCOPY  1974   FOOT FUSION Left 06/22/2017   HEMORRHOID SURGERY N/A 03/30/2017   Procedure: HEMORRHOIDECTOMY;  Surgeon: Abigail Miyamoto, MD;  Location: [facility];  Service: General;  Laterality: N/A;   open reduction L little finger Left 1970   Repair digital thumb, left Left 1990   Right shoulder cuff repair Right 09/15/11   Right shoulder SAD, DCR Right 02/05/11   TOTAL HIP  ARTHROPLASTY Left 07/26/06   TOTAL HIP ARTHROPLASTY Right 1999   TOTAL HIP REVISION Left 03/07/2019   Procedure: LEFT TOTAL HIP REVISION POSTERIOR APPROACH;  Surgeon: Gean Birchwood, MD;  Location: [facility];  Service: Orthopedics;  Laterality: Left;   Social History   Occupational History   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

## 2023-02-19 ENCOUNTER — Encounter: Payer: Self-pay | Admitting: Physician Assistant

## 2023-02-19 ENCOUNTER — Ambulatory Visit (INDEPENDENT_AMBULATORY_CARE_PROVIDER_SITE_OTHER): Payer: Medicare Other

## 2023-02-19 VITALS — Ht 68.75 in | Wt 246.0 lb

## 2023-02-19 DIAGNOSIS — Z0000 Encounter for general adult medical examination without abnormal findings: Secondary | ICD-10-CM

## 2023-02-19 NOTE — Patient Instructions (Signed)
Mr. Moskos , Thank you for taking time to come for your Medicare Wellness Visit. I appreciate your ongoing commitment to your health goals. Please review the following plan we discussed and let me know if I can assist you in the future.   Referrals/Orders/Follow-Ups/Clinician Recommendations: You are due for a Shingles vaccines, which you can get this done at your pharmacy.  You can also discuss with your provider during your next office visit.  Have a Happy New Year and keep up the good work.  This is a list of the screening recommended for you and due dates:  Health Maintenance  Topic Date Due   Zoster (Shingles) Vaccine (1 of 2) Never done   Medicare Annual Wellness Visit  02/19/2024   Colon Cancer Screening  03/09/2025   DTaP/Tdap/Td vaccine (2 - Td or Tdap) 07/11/2025   Pneumonia Vaccine  Completed   Flu Shot  Completed   Hepatitis C Screening  Completed   HPV Vaccine  Aged Out   COVID-19 Vaccine  Discontinued    Advanced directives: (Copy Requested) Please bring a copy of your health care power of attorney and living will to the office to be added to your chart at your convenience.  Next Medicare Annual Wellness Visit scheduled for next year: Yes

## 2023-02-19 NOTE — Progress Notes (Signed)
Subjective:   Jonathan Powers is a 72 y.o. male who presents for Medicare Annual/Subsequent preventive examination.  Visit Complete: Virtual I connected with  Jonathan Powers on 02/19/23 by a audio enabled telemedicine application and verified that I am speaking with the correct person using two identifiers.  Patient Location: Home  Provider Location: Office/Clinic  I discussed the limitations of evaluation and management by telemedicine. The patient expressed understanding and agreed to proceed.  Vital Signs: Because this visit was a virtual/telehealth visit, some criteria may be missing or patient reported. Any vitals not documented were not able to be obtained and vitals that have been documented are patient reported.  Patient Medicare AWV questionnaire was completed by the patient on 02/18/2023; I have confirmed that all information answered by patient is correct and no changes since this date.  Cardiac Risk Factors include: advanced age (>33men, >33 women);male gender;obesity (BMI >30kg/m2);Other (see comment);hypertension;dyslipidemia, Risk factor comments: OSA ,Mild neurocognitive disorder due to Alzheimer's disease, BPH     Objective:    Today's Vitals   02/18/23 2104 02/19/23 0947  Weight:  246 lb (111.6 kg)  Height:  5' 8.75" (1.746 m)  PainSc: 3     Body mass index is 36.59 kg/m.     02/19/2023    9:56 AM 12/06/2022    9:04 AM 10/30/2022   12:52 PM 10/23/2022    8:45 PM 10/22/2022   12:37 PM 10/21/2022   11:49 PM 07/12/2022    1:37 PM  Advanced Directives  Does Patient Have a Medical Advance Directive? Yes No Yes No No No No  Type of Estate agent of [place];Living will  Healthcare Power of [place];Living will      Copy of Healthcare Power of Attorney in Chart? No - copy requested        Would patient like information on creating a medical advance directive?    No - Patient declined   No - Patient declined    Current Medications  (verified) Outpatient Encounter Medications as of 02/19/2023  Medication Sig   cetirizine (ZYRTEC) 10 MG tablet Take 10 mg by mouth daily as needed for allergies.   levothyroxine (SYNTHROID) 175 MCG tablet Take 1 tablet (175 mcg total) by mouth daily before breakfast.   memantine (NAMENDA) 5 MG tablet Take 10 mg by mouth daily.   rosuvastatin (CRESTOR) 5 MG tablet Take 1 tablet (5 mg total) by mouth daily.   Testosterone 1.62 % GEL Apply 40.5 mg topically in the morning.   traZODone (DESYREL) 100 MG tablet TAKE 1 TABLET BY MOUTH AT BEDTIME AS NEEDED FOR SLEEP   dicyclomine (BENTYL) 10 MG capsule Take 1 capsule (10 mg total) by mouth 3 (three) times daily before meals. (Patient not taking: Reported on 01/16/2023)   [DISCONTINUED] apixaban (ELIQUIS) 2.5 MG TABS tablet Take 1 tablet (2.5 mg total) by mouth 2 (two) times daily.   No facility-administered encounter medications on file as of 02/19/2023.    Allergies (verified) Nsaids and Tolmetin   History: Past Medical History:  Diagnosis Date   Acute recurrent pansinusitis 02/17/2021   Age related osteoporosis 03/11/2018   Allergic rhinitis 05/17/2006   BPH (benign prostatic hyperplasia) 09/06/2012   Cellulitis of left lower extremity 11/10/2018   Cellulitis, right leg    Recurrent R hip cellulitis 1/08,3/08    Chronic fatigue, unspecified    Chronic hyperglycemia 06/29/2020   Chronic right-sided low back pain without sciatica 07/21/2016   Chronic sinusitis 07/10/2021   Deviated  septum 02/17/2021   Diverticulitis    Essential hypertension 05/17/2006   2014     Impression  Exercise Capacity:  Lexiscan with low level exercise.  BP Response:  Normal blood pressure response.  Clinical Symptoms:  No significant symptoms noted.  ECG Impression:  No significant ST segment change suggestive of ischemia.  Comparison with Prior Nuclear Study: No images to compare     Overall Impression:  Low risk stress nuclear study There is mild apical  thinning but no    Exocrine pancreatic insufficiency 12/03/2021   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   Flexural eczema 07/24/2017   Generalized abdominal tenderness with rebound tenderness 08/02/2022   GERD (gastroesophageal reflux disease)    Gout    Hashimoto's thyroiditis    History of skin cancer    Melanoma on back   Hypogonadism male    low T, a/w ED   Hypothyroidism 11/11/2018   Idiopathic urticaria    Insomnia 11/10/2018   Left flank pain 08/02/2022   Left thyroid nodule 2008   on [place], decrease size in 2011 [place]   Lung nodule 07/25/2021   Migraines    Atypical and ocular   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 No acute findings.  2. 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  3. Ectatic abdominal aorta at risk for aneurysm development.  Recommend followup by ultrasound in 5 years. This recommendation  follows ACR consensus guidelines: White Paper of the ACR Incidental  Findings Committee II on Vascular Findings. J Am Coll Radiol 2013;  [phone].  4. 1.   Symptomatic PVCs 09/06/2012   Past Surgical History:  Procedure Laterality Date   CARPAL TUNNEL RELEASE Left 03/30/2017   Procedure: CARPAL TUNNEL RELEASE;  Surgeon: Valeria Batman, MD;  Location: [facility];  Service: Orthopedics;  Laterality: Left;   CHOLECYSTECTOMY  2004   CTR Bilateral 1982   CYSTOSCOPY  1974   FOOT FUSION Left 06/22/2017    HEMORRHOID SURGERY N/A 03/30/2017   Procedure: HEMORRHOIDECTOMY;  Surgeon: Abigail Miyamoto, MD;  Location: [facility];  Service: General;  Laterality: N/A;   open reduction L little finger Left 1970   Repair digital thumb, left Left 1990   Right shoulder cuff repair Right 09/15/11   Right shoulder SAD, DCR Right 02/05/11   TOTAL HIP ARTHROPLASTY Left 07/26/06   TOTAL HIP ARTHROPLASTY Right 1999   TOTAL HIP REVISION Left 03/07/2019   Procedure: LEFT TOTAL HIP REVISION POSTERIOR APPROACH;  Surgeon: Gean Birchwood, MD;  Location: [facility];  Service: Orthopedics;  Laterality: Left;   Family History  Problem Relation Age of Onset   Arthritis Father    Heart disease Father    Diabetes Father    Vascular Disease Father        AoBifem   Arthritis Mother    Diabetes Mother    Multiple sclerosis Mother    Cancer Paternal  Grandmother        "GI"   Cancer Paternal Grandfather        stomach cancer   Thyroid cancer Sister    Uterine cancer Sister    Social History   Socioeconomic History   Marital status: Married    Spouse name: [name]   Number of children: 3   Years of education: 18   Highest education level: Not on file  Occupational History   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   Social Drivers of Corporate investment banker Strain: Low Risk  (02/19/2023)   Overall Financial Resource Strain (CARDIA)    Difficulty of Paying Living Expenses: Not hard at all  Food Insecurity: No Food Insecurity (02/19/2023)   Hunger  Vital Sign    Worried About Running Out of Food in the Last Year: Never true    Ran Out of Food in the Last Year: Never true  Transportation Needs: No Transportation Needs (02/19/2023)   PRAPARE - Administrator, Civil Service (Medical): No    Lack of Transportation (Non-Medical): No  Physical Activity: Sufficiently Active (02/19/2023)   Exercise Vital Sign    Days of Exercise per Week: 7 days    Minutes of Exercise per Session: 40 min  Stress: No Stress Concern Present (02/19/2023)   [employer] of Occupational Health - Occupational Stress Questionnaire    Feeling of Stress : Not at all  Social Connections: Moderately Isolated (02/19/2023)   Social Connection and Isolation Panel [NHANES]    Frequency of Communication with Friends and Family: Twice a week    Frequency of Social Gatherings with Friends and Family: Once a week    Attends Religious Services: Never    Database administrator or Organizations: No    Attends Engineer, structural: Never    Marital Status: Married    Tobacco Counseling Counseling given: Not Answered   Clinical Intake:  Pre-visit preparation completed: Yes  Pain : 0-10 Pain Score: 3  Pain Type: Chronic pain Pain Location:  (everywhere due to arthritis) Pain Onset: More than a month ago Pain Frequency: Constant Pain Relieving Factors: Tylenol  Pain Relieving Factors: Tylenol  BMI - recorded: 36.59 Nutritional Status: BMI > 30  Obese Nutritional Risks: None Diabetes: No  How often do you need to have someone help you when you read instructions, pamphlets, or other written materials from your doctor or pharmacy?: 1 - Never  Interpreter Needed?: No  Information entered by :: Hadley Detloff, RMA   Activities of Daily Living    02/18/2023    9:04 PM  In your present state of health, do you have any difficulty performing the following activities:  Hearing? 0  Vision? 0  Difficulty concentrating or making decisions?  1  Walking or climbing stairs? 0  Dressing or bathing? 0  Doing errands, shopping? 0  Preparing Food and  eating ? N  Using the Toilet? N  In the past six months, have you accidently leaked urine? N  Do you have problems with loss of bowel control? N  Managing your Medications? Y  Managing your Finances? N  Housekeeping or managing your Housekeeping? N    Patient Care Team: [name], MD as PCP - General (Internal Medicine) Pollyann Savoy, MD (Rheumatology) Bernette Redbird, MD (Gastroenterology) Laurey Morale, MD (Cardiology) Jerilee Field, MD (Urology) Elwyn Reach (Neurology)  Indicate any recent Medical Services you may have received from other than [facility] providers in the past year (date may be approximate).     Assessment:   This is a routine wellness examination for [place].  Hearing/Vision screen Hearing Screening - Comments:: Denies hearing difficulties   Vision Screening - Comments:: Denies vision issues.    Goals Addressed               This Visit's Progress     Patient Stated (pt-stated)        Maintain health      Depression Screen    02/19/2023   10:02 AM 09/26/2022    3:00 PM 07/20/2022    2:39 PM 05/01/2022    9:49 AM 10/11/2021    3:37 PM 09/15/2021    2:25 PM 02/09/2021    9:44 AM  PHQ 2/9 Scores  PHQ - 2 Score 0 0 0 0 0 1 0  PHQ- 9 Score 4  0  0      Fall Risk    02/18/2023    9:04 PM 10/30/2022   12:52 PM 09/26/2022    3:00 PM 07/20/2022    2:39 PM 05/01/2022    9:49 AM  Fall Risk   Falls in the past year? 1 0 1 0 0  Number falls in past yr: 0 0 0 0   Injury with Fall? 1 0 1 0   Risk for fall due to :   History of fall(s) No Fall Risks No Fall Risks  Follow up Falls evaluation completed;Falls prevention discussed Falls evaluation completed Falls evaluation completed Falls evaluation completed Falls evaluation completed    MEDICARE RISK AT HOME: Medicare Risk at Home Any stairs in or around the home?:  (Patient-Rptd) Yes If so, are there any without handrails?: (Patient-Rptd) No Home free of loose throw rugs in walkways, pet beds, electrical cords, etc?: (Patient-Rptd) Yes Adequate lighting in your home to reduce risk of falls?: (Patient-Rptd) Yes Life alert?: (Patient-Rptd) No Use of a cane, walker or w/c?: (Patient-Rptd) No Grab bars in the bathroom?: (Patient-Rptd) No Shower chair or bench in shower?: (Patient-Rptd) No Elevated toilet seat or a handicapped toilet?: (Patient-Rptd) No  TIMED UP AND GO:  Was the test performed?  No    Cognitive Function:    10/30/2022    4:00 PM 04/27/2022    2:00 PM 10/27/2021    2:00 PM 04/27/2021    6:00 AM  MMSE - Mini Mental State Exam  Orientation to time 2 2 4 5   Orientation to Place 5 5 5 5   Registration 3 3 3 3   Attention/ Calculation 5 4 5 4   Recall 0 1 1 1   Language- name 2 objects 2 2 2 2   Language- repeat 1 1 1 1   Language- follow 3 step command 3 3 3 3   Language- read & follow direction 1 1 1 1   Write a sentence 1 1 1 1   Copy design 1 1 0 1  Total score 24 24 26 27       07/06/2020   11:00 AM  Montreal Cognitive Assessment   Visuospatial/ Executive (0/5) 3  Naming (0/3) 3  Attention: Read list of digits (0/2) 2  Attention: Read list of letters (0/1) 1  Attention: Serial 7 subtraction starting at 100 (0/3) 3  Language: Repeat phrase (0/2) 1  Language : Fluency (0/1) 1  Abstraction (0/2) 2  Delayed Recall (0/5) 0  Orientation (0/6) 6  Total 22  Adjusted Score (based on education) 22      02/19/2023    9:48 AM  6CIT Screen  What Year? 4 points  What month? 0 points  What time? 0 points  Count back from 20 0 points  Months in reverse 0 points  Repeat phrase 0 points  Total Score 4 points    Immunizations Immunization History  Administered Date(s) Administered   Fluad Quad(high Dose 65+) 12/24/2019, 11/08/2021   Fluad Trivalent(High Dose 65+) 10/20/2022, 11/22/2022   Influenza Split 12/21/2013   Influenza, High  Dose Seasonal PF 11/20/2017, 11/21/2018   Influenza,inj,Quad PF,6+ Mos 12/11/2012, 11/20/2016   Influenza-Unspecified 11/21/2014, 11/19/2015   PFIZER(Purple Top)SARS-COV-2 Vaccination 02/28/2019, 03/18/2019   Pneumococcal Conjugate-13 02/19/2017   Pneumococcal Polysaccharide-23 07/12/2015, 01/16/2023   Tdap 07/12/2015    TDAP status: Up to date  Flu Vaccine status: Up to date  Pneumococcal vaccine status: Up to date  Covid-19 vaccine status: Information provided on how to obtain vaccines.   Qualifies for Shingles Vaccine? Yes   Zostavax completed No   Shingrix Completed?: No.    Education has been provided regarding the importance of this vaccine. Patient has been advised to call insurance company to determine out of pocket expense if they have not yet received this vaccine. Advised may also receive vaccine at local pharmacy or Health Dept. Verbalized acceptance and understanding.  Screening Tests Health Maintenance  Topic Date Due   Zoster Vaccines- Shingrix (1 of 2) Never done   Medicare Annual Wellness (AWV)  02/19/2024   Colonoscopy  03/09/2025   DTaP/Tdap/Td (2 - Td or Tdap) 07/11/2025   Pneumonia Vaccine 44+ Years old  Completed   INFLUENZA VACCINE  Completed   Hepatitis C Screening  Completed   HPV VACCINES  Aged Out   COVID-19 Vaccine  Discontinued    Health Maintenance  Health Maintenance Due  Topic Date Due   Zoster Vaccines- Shingrix (1 of 2) Never done    Colorectal cancer screening: Type of screening: Colonoscopy. Completed 03/09/2022. Repeat every 3 years  Lung Cancer Screening: (Low Dose CT Chest recommended if Age 69-80 years, 20 pack-year currently smoking OR have quit w/in 15years.) does not qualify.   Lung Cancer Screening Referral: N/A  Additional Screening:  Hepatitis C Screening: does qualify; Completed 09/26/2016  Vision Screening: Recommended annual ophthalmology exams for early detection of glaucoma and other disorders of the eye. Is the  patient up to date with their annual eye exam?  Yes  Who is the provider or what is the name of the office in which the patient attends annual eye exams? Dr. Dione Booze If pt is not established with a provider, would they like to be referred to a provider to establish care? No .   Dental Screening: Recommended annual dental exams for proper oral hygiene   Community Resource Referral / Chronic Care Management: CRR required this visit?  No   CCM required this visit?  No     Plan:     I have personally  reviewed and noted the following in the patient's chart:   Medical and social history Use of alcohol, tobacco or illicit drugs  Current medications and supplements including opioid prescriptions. Patient is not currently taking opioid prescriptions. Functional ability and status Nutritional status Physical activity Advanced directives List of other physicians Hospitalizations, surgeries, and ER visits in previous 12 months Vitals Screenings to include cognitive, depression, and falls Referrals and appointments  In addition, I have reviewed and discussed with patient certain preventive protocols, quality metrics, and best practice recommendations. A written personalized care plan for preventive services as well as general preventive health recommendations were provided to patient.     Mechell Girgis L Salam Chesterfield, CMA   02/19/2023   After Visit Summary: (MyChart) Due to this being a telephonic visit, the after visit summary with patients personalized plan was offered to patient via MyChart   Nurse Notes: Patient is due for a Shingrix vaccine.  Patient would like to discuss with Dr. Yetta Barre during his next office visit.  He is up to date with all other health maintenance.  Patient had no other concerns to address today.

## 2023-02-27 ENCOUNTER — Ambulatory Visit (INDEPENDENT_AMBULATORY_CARE_PROVIDER_SITE_OTHER): Payer: No Typology Code available for payment source | Admitting: Orthopedic Surgery

## 2023-02-27 DIAGNOSIS — B353 Tinea pedis: Secondary | ICD-10-CM | POA: Diagnosis not present

## 2023-02-28 ENCOUNTER — Other Ambulatory Visit: Payer: Self-pay | Admitting: Internal Medicine

## 2023-02-28 DIAGNOSIS — E039 Hypothyroidism, unspecified: Secondary | ICD-10-CM

## 2023-03-04 ENCOUNTER — Other Ambulatory Visit: Payer: Self-pay | Admitting: Internal Medicine

## 2023-03-04 DIAGNOSIS — G4700 Insomnia, unspecified: Secondary | ICD-10-CM

## 2023-03-05 ENCOUNTER — Encounter: Payer: Self-pay | Admitting: Orthopedic Surgery

## 2023-03-05 NOTE — Progress Notes (Signed)
 Office Visit Note   Patient: Jonathan Powers           Date of Birth: Jun 08, 1950           MRN: [id number] Visit Date: 02/27/2023              Requested by: Joshua Debby CROME, MD [address] [place],  [place] [address] PCP: Joshua Debby CROME, MD  Chief Complaint  Patient presents with   Right Foot - Follow-up      HPI: Patient is a 73 year old gentleman who is seen in follow-up for fungal infection interdigital spaces right foot.  Patient previously was using the Vive sock between the toes but he states he is run out.  Assessment & Plan: Visit Diagnoses:  1. Tinea pedis of both feet     Plan: Will give patient a bottle of Vashe to use to cleanse between the toes and the Vive sock to cut out and placed between the toes after washing with Vashe.  Follow-Up Instructions: Return if symptoms worsen or fail to improve.   Ortho Exam  Patient is alert, oriented, no adenopathy, well-dressed, normal affect, normal respiratory effort. Examination patient has maceration and superficial ulcers left foot interdigital spaces.  There is no ascending cellulitis.  Patient's socks are damp and the skin is macerated.  Imaging: No results found. No images are attached to the encounter.  Labs: Lab Results  Component Value Date   HGBA1C 5.3 09/08/2021   HGBA1C 5.8 02/09/2021   HGBA1C 5.8 06/29/2020   ESRSEDRATE 38 (H) 04/23/2022   ESRSEDRATE 86 (H) 09/11/2021   ESRSEDRATE 9 07/09/2018   CRP 2.3 (H) 04/23/2022   CRP 1.2 09/15/2021   CRP 4.2 (H) 09/11/2021   LABURIC 9.0 (H) 11/28/2019   LABURIC 8.1 (H) 07/09/2018   LABURIC 6.7 02/05/2018   REPTSTATUS 08/13/2022 FINAL 08/08/2022   CULT  08/08/2022    NO GROWTH 5 DAYS Performed at [facility], [address] [place], [place] [address]      Lab Results  Component Value Date   ALBUMIN 3.6 12/06/2022   ALBUMIN 4.1 10/22/2022   ALBUMIN 3.9 10/22/2022    Lab Results  Component Value Date   MG 2.0 09/11/2021   MG 1.9  09/10/2021   MG 1.9 09/08/2021   Lab Results  Component Value Date   VD25OH 55 07/09/2018   VD25OH 54 04/04/2018   VD25OH 46 03/01/2016    No results found for: PREALBUMIN    Latest Ref Rng & Units 12/06/2022    9:53 AM 10/22/2022    4:09 PM 10/22/2022   12:10 AM  CBC EXTENDED  WBC 4.0 - 10.5 K/uL 6.3  6.9  9.5   RBC 4.22 - 5.81 MIL/uL 4.90  5.47  5.50   Hemoglobin 13.0 - 17.0 g/dL 85.3  83.6  83.8   HCT 39.0 - 52.0 % 42.6  48.7  48.8   Platelets 150 - 400 K/uL 286  251  275   NEUT# 1.7 - 7.7 K/uL 4.0     Lymph# 0.7 - 4.0 K/uL 1.4        There is no height or weight on file to calculate BMI.  Orders:  No orders of the defined types were placed in this encounter.  No orders of the defined types were placed in this encounter.    Procedures: No procedures performed  Clinical Data: No additional findings.  ROS:  All other systems negative, except as noted in  the HPI. Review of Systems  Objective: Vital Signs: There were no vitals taken for this visit.  Specialty Comments:  No specialty comments available.  PMFS History: Patient Active Problem List   Diagnosis Date Noted   Dyslipidemia, goal LDL below 100 01/22/2023   Need for prophylactic vaccination and inoculation against varicella 01/16/2023   Mild neurocognitive disorder due to Alzheimer's disease 09/20/2022   Exocrine pancreatic insufficiency 12/03/2021   Lung nodule 07/25/2021   Hypothyroidism 11/11/2018   Insomnia 11/10/2018   GERD (gastroesophageal reflux disease) 11/10/2018   Age related osteoporosis 03/11/2018   Flexural eczema 07/24/2017   Chronic right-sided low back pain without sciatica 07/21/2016   Superior mesenteric artery aneurysm 07/12/2015   Left thyroid  nodule    OSA (obstructive sleep apnea) 01/27/2013   Symptomatic PVCs 09/06/2012   BPH (benign prostatic hyperplasia) 09/06/2012   Obesity (BMI 30-39.9) 05/17/2006   Essential hypertension 05/17/2006   Allergic rhinitis 05/17/2006    Primary osteoarthritis 05/17/2006   Past Medical History:  Diagnosis Date   Acute recurrent pansinusitis 02/17/2021   Age related osteoporosis 03/11/2018   Allergic rhinitis 05/17/2006   BPH (benign prostatic hyperplasia) 09/06/2012   Cellulitis of left lower extremity 11/10/2018   Cellulitis, right leg    Recurrent R hip cellulitis 1/08,3/08    Chronic fatigue, unspecified    Chronic hyperglycemia 06/29/2020   Chronic right-sided low back pain without sciatica 07/21/2016   Chronic sinusitis 07/10/2021   Deviated septum 02/17/2021   Diverticulitis    Essential hypertension 05/17/2006   2014     Impression  Exercise Capacity:  Lexiscan  with low level exercise.  BP Response:  Normal blood pressure response.  Clinical Symptoms:  No significant symptoms noted.  ECG Impression:  No significant ST segment change suggestive of ischemia.  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   Flexural eczema 07/24/2017   Generalized abdominal tenderness with rebound tenderness 08/02/2022   GERD (gastroesophageal reflux disease)    Gout    Hashimoto's thyroiditis    History of skin cancer    Melanoma on back   Hypogonadism male    low T, a/w ED   Hypothyroidism 11/11/2018   Idiopathic urticaria    Insomnia 11/10/2018   Left flank pain 08/02/2022   Left thyroid  nodule 2008   on US , decrease size in 2011 US    Lung nodule 07/25/2021   Migraines    Atypical and ocular   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   09/2015 compliance report> > 4 hours 80% of days, used 25/30 days   Primary  osteoarthritis 05/17/2006   Rash 07/22/2022   Reactive thrombocytosis 09/17/2021   Recurrent genital HSV (herpes simplex virus) infection 11/12/2018   Rib fracture 07/22/2022   Superior mesenteric artery aneurysm 07/12/2015   DEC 2019     IMPRESSION:  VASCULAR     1. No acute findings.  2. 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  3. Ectatic abdominal aorta at risk for aneurysm development.  Recommend followup by ultrasound in 5 years. This recommendation  follows ACR consensus guidelines: White Paper of the  ACR Incidental  Findings Committee II on Vascular Findings. J Am Coll Radiol 2013;  10:789-794.  4. 1.   Symptomatic PVCs 09/06/2012    Family History  Problem Relation Age of Onset   Arthritis Father    Heart disease Father    Diabetes Father    Vascular Disease Father        AoBifem   Arthritis Mother    Diabetes Mother    Multiple sclerosis Mother    Cancer Paternal Grandmother        GI   Cancer Paternal Grandfather        stomach cancer   Thyroid  cancer Sister    Uterine cancer Sister     Past Surgical History:  Procedure Laterality Date   CARPAL TUNNEL RELEASE Left 03/30/2017   Procedure: CARPAL TUNNEL RELEASE;  Surgeon: Anderson Maude ORN, MD;  Location: [facility];  Service: Orthopedics;  Laterality: Left;   CHOLECYSTECTOMY  2004   CTR Bilateral 1982   CYSTOSCOPY  1974   FOOT FUSION Left 06/22/2017   HEMORRHOID SURGERY N/A 03/30/2017   Procedure: HEMORRHOIDECTOMY;  Surgeon: Vernetta Berg, MD;  Location: [facility];  Service: General;  Laterality: N/A;   open reduction L little finger Left 1970   Repair digital thumb, left Left 1990   Right shoulder cuff repair Right 09/15/11   Right shoulder SAD, DCR Right 02/05/11   TOTAL HIP ARTHROPLASTY Left 07/26/06   TOTAL HIP ARTHROPLASTY Right 1999   TOTAL HIP REVISION Left 03/07/2019   Procedure: LEFT TOTAL HIP REVISION POSTERIOR APPROACH;  Surgeon: Liam Lerner, MD;  Location: [facility];  Service: Orthopedics;  Laterality: Left;   Social History   Occupational History   Occupation: Retired    Comment: [employer]  Tobacco Use   Smoking status: Never   Smokeless tobacco: Never  Vaping Use   Vaping status: Never Used  Substance and Sexual Activity   Alcohol use: Not Currently    Comment: rare   Drug use: No   Sexual activity: Yes

## 2023-03-10 ENCOUNTER — Encounter: Payer: Self-pay | Admitting: Orthopedic Surgery

## 2023-03-10 ENCOUNTER — Encounter: Payer: Self-pay | Admitting: Physician Assistant

## 2023-03-26 DIAGNOSIS — R948 Abnormal results of function studies of other organs and systems: Secondary | ICD-10-CM | POA: Diagnosis not present

## 2023-03-26 DIAGNOSIS — E291 Testicular hypofunction: Secondary | ICD-10-CM | POA: Diagnosis not present

## 2023-04-02 DIAGNOSIS — E291 Testicular hypofunction: Secondary | ICD-10-CM | POA: Diagnosis not present

## 2023-04-08 ENCOUNTER — Other Ambulatory Visit: Payer: Self-pay

## 2023-04-08 ENCOUNTER — Emergency Department (HOSPITAL_COMMUNITY)
Admission: EM | Admit: 2023-04-08 | Discharge: 2023-04-08 | Disposition: A | Discharge status: Home / Self Care | Payer: HMO | Attending: Emergency Medicine | Admitting: Emergency Medicine

## 2023-04-08 ENCOUNTER — Emergency Department (HOSPITAL_COMMUNITY): Payer: HMO

## 2023-04-08 ENCOUNTER — Encounter (HOSPITAL_COMMUNITY): Payer: Self-pay | Admitting: Emergency Medicine

## 2023-04-08 DIAGNOSIS — T07XXXA Unspecified multiple injuries, initial encounter: Secondary | ICD-10-CM

## 2023-04-08 DIAGNOSIS — R58 Hemorrhage, not elsewhere classified: Secondary | ICD-10-CM | POA: Diagnosis not present

## 2023-04-08 DIAGNOSIS — S0181XA Laceration without foreign body of other part of head, initial encounter: Secondary | ICD-10-CM | POA: Insufficient documentation

## 2023-04-08 DIAGNOSIS — Y92410 Unspecified street and highway as the place of occurrence of the external cause: Secondary | ICD-10-CM | POA: Diagnosis not present

## 2023-04-08 DIAGNOSIS — R519 Headache, unspecified: Secondary | ICD-10-CM | POA: Diagnosis not present

## 2023-04-08 DIAGNOSIS — S0081XA Abrasion of other part of head, initial encounter: Secondary | ICD-10-CM | POA: Diagnosis not present

## 2023-04-08 DIAGNOSIS — I10 Essential (primary) hypertension: Secondary | ICD-10-CM | POA: Diagnosis not present

## 2023-04-08 DIAGNOSIS — G4489 Other headache syndrome: Secondary | ICD-10-CM | POA: Diagnosis not present

## 2023-04-08 DIAGNOSIS — M542 Cervicalgia: Secondary | ICD-10-CM | POA: Diagnosis not present

## 2023-04-08 DIAGNOSIS — R110 Nausea: Secondary | ICD-10-CM | POA: Diagnosis not present

## 2023-04-08 MED ORDER — ACETAMINOPHEN 325 MG PO TABS
650.0000 mg | ORAL_TABLET | Freq: Once | ORAL | Status: AC
Start: 2023-04-08 — End: 2023-04-08
  Administered 2023-04-08: 650 mg via ORAL
  Filled 2023-04-08: qty 2

## 2023-04-08 MED ORDER — SULFAMETHOXAZOLE-TRIMETHOPRIM 800-160 MG PO TABS: 1.0000 | ORAL_TABLET | Freq: Two times a day (BID) | ORAL | 0 refills | Status: AC

## 2023-04-08 MED ORDER — TETANUS-DIPHTH-ACELL PERTUSSIS 5-2.5-18.5 LF-MCG/0.5 IM SUSY
0.5000 mL | PREFILLED_SYRINGE | Freq: Once | INTRAMUSCULAR | Status: DC
  Filled 2023-04-08: qty 0.5

## 2023-04-08 MED ORDER — SULFAMETHOXAZOLE-TRIMETHOPRIM 800-160 MG PO TABS
1.0000 | ORAL_TABLET | Freq: Once | ORAL | Status: AC
  Administered 2023-04-08: 1 via ORAL
  Filled 2023-04-08: qty 1

## 2023-04-08 MED ORDER — METOCLOPRAMIDE HCL 10 MG PO TABS
10.0000 mg | ORAL_TABLET | Freq: Once | ORAL | Status: AC
  Administered 2023-04-08: 10 mg via ORAL
  Filled 2023-04-08: qty 1

## 2023-04-08 NOTE — Discharge Instructions (Signed)
Your tetanus was updated in the ER.  I have prescribed Bactrim twice a day for a week to prevent infection  See your doctor for follow-up  Take Tylenol for headache  Return to ER if you have worse headache or vomiting or fever
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
## 2023-04-20 ENCOUNTER — Other Ambulatory Visit: Payer: Self-pay | Admitting: Cardiology

## 2023-04-20 DIAGNOSIS — I10 Essential (primary) hypertension: Secondary | ICD-10-CM

## 2023-04-25 ENCOUNTER — Encounter: Payer: Self-pay | Admitting: Physician Assistant

## 2023-04-25 ENCOUNTER — Ambulatory Visit: Payer: Medicare Other | Admitting: Physician Assistant

## 2023-04-25 VITALS — BP 101/62 | HR 61 | Resp 18 | Ht 68.75 in | Wt 248.0 lb

## 2023-04-25 DIAGNOSIS — G309 Alzheimer's disease, unspecified: Secondary | ICD-10-CM

## 2023-04-25 DIAGNOSIS — F0670 Mild neurocognitive disorder due to known physiological condition without behavioral disturbance: Secondary | ICD-10-CM | POA: Diagnosis not present

## 2023-04-25 MED ORDER — MEMANTINE HCL 10 MG PO TABS: 10.0000 mg | ORAL_TABLET | Freq: Two times a day (BID) | ORAL | 11 refills | Status: AC

## 2023-04-25 NOTE — Patient Instructions (Signed)
 It was a pleasure to see you today at our office.   Recommendations:  Follow up in 6 months Increase memantine to 10 mg at twice a day  Neurocognitive testing         RECOMMENDATIONS FOR ALL PATIENTS WITH MEMORY PROBLEMS: 1. Continue to exercise (Recommend 30 minutes of walking everyday, or 3 hours every week) 2. Increase social interactions - continue going to [place] and enjoy social gatherings with friends and family 3. Eat healthy, avoid fried foods and eat more fruits and vegetables 4. Maintain adequate blood pressure, blood sugar, and blood cholesterol level. Reducing the risk of stroke and cardiovascular disease also helps promoting better memory. 5. Avoid stressful situations. Live a simple life and avoid aggravations. Organize your time and prepare for the next day in anticipation. 6. Sleep well, avoid any interruptions of sleep and avoid any distractions in the bedroom that may interfere with adequate sleep quality 7. Avoid sugar, avoid sweets as there is a strong link between excessive sugar intake, diabetes, and cognitive impairment We discussed the Mediterranean diet, which has been shown to help patients reduce the risk of progressive memory disorders and reduces cardiovascular risk. This includes eating fish, eat fruits and green leafy vegetables, nuts like almonds and hazelnuts, walnuts, and also use olive oil. Avoid fast foods and fried foods as much as possible. Avoid sweets and sugar as sugar use has been linked to worsening of memory function.  There is always a concern of gradual progression of memory problems. If this is the case, then we may need to adjust level of care according to patient needs. Support, both to the patient and caregiver, should then be put into place.     FALL PRECAUTIONS: Be cautious when walking. Scan the area for obstacles that may increase the risk of trips and falls. When getting up in the mornings, sit up at the edge of the bed for a few  minutes before getting out of bed. Consider elevating the bed at the head end to avoid drop of blood pressure when getting up. Walk always in a well-lit room (use night lights in the walls). Avoid area rugs or power cords from appliances in the middle of the walkways. Use a walker or a cane if necessary and consider physical therapy for balance exercise. Get your eyesight checked regularly.  FINANCIAL OVERSIGHT: Supervision, especially oversight when making financial decisions or transactions is also recommended.  HOME SAFETY: Consider the safety of the kitchen when operating appliances like stoves, microwave oven, and blender. Consider having supervision and share cooking responsibilities until no longer able to participate in those. Accidents with firearms and other hazards in the house should be identified and addressed as well.   ABILITY TO BE LEFT ALONE: If patient is unable to contact 911 operator, consider using LifeLine, or when the need is there, arrange for someone to stay with patients. Smoking is a fire hazard, consider supervision or cessation. Risk of wandering should be assessed by caregiver and if detected at any point, supervision and safe proof recommendations should be instituted.  MEDICATION SUPERVISION: Inability to self-administer medication needs to be constantly addressed. Implement a mechanism to ensure safe administration of the medications.   DRIVING: Regarding driving, in patients with progressive memory problems, driving will be impaired. We advise to have someone else do the driving if trouble finding directions or if minor accidents are reported. Independent driving assessment is available to determine safety of driving.   If you are interested in  the driving assessment, you can contact the following:  [facility] [employer] [facility] in [place] [phone]  [facility] [phone]  [facility] [phone]  or [phone]    Mediterranean Diet A Mediterranean diet refers to food and lifestyle choices that are based on the traditions of countries located on the [employer] [place]. This way of eating has been shown to help prevent certain conditions and improve outcomes for people who have chronic diseases, like kidney disease and heart disease. What are tips for following this plan? Lifestyle  Cook and eat meals together with your family, when possible. Drink enough fluid to keep your urine clear or pale yellow. Be physically active every day. This includes: Aerobic exercise like running or swimming. Leisure activities like gardening, walking, or housework. Get 7-8 hours of sleep each night. If recommended by your health care provider, drink red wine in moderation. This means 1 glass a day for nonpregnant women and 2 glasses a day for men. A glass of wine equals 5 oz (150 mL). Reading food labels  Check the serving size of packaged foods. For foods such as rice and pasta, the serving size refers to the amount of cooked product, not dry. Check the total fat in packaged foods. Avoid foods that have saturated fat or trans fats. Check the ingredients list for added sugars, such as corn syrup. Shopping  At the grocery store, buy most of your food from the areas near the walls of the store. This includes: Fresh fruits and vegetables (produce). Grains, beans, nuts, and seeds. Some of these may be available in unpackaged forms or large amounts (in bulk). Fresh seafood. Poultry and eggs. Low-fat dairy products. Buy whole ingredients instead of prepackaged foods. Buy fresh fruits and vegetables in-season from local farmers markets. Buy frozen fruits and vegetables in resealable bags. If you do not have access to quality fresh seafood, buy precooked frozen shrimp or canned fish, such as tuna, salmon, or sardines. Buy small amounts of raw or cooked vegetables, salads, or olives from the deli or salad  bar at your store. Stock your pantry so you always have certain foods on hand, such as olive oil, canned tuna, canned tomatoes, rice, pasta, and beans. Cooking  Cook foods with extra-virgin olive oil instead of using butter or other vegetable oils. Have meat as a side dish, and have vegetables or grains as your main dish. This means having meat in small portions or adding small amounts of meat to foods like pasta or stew. Use beans or vegetables instead of meat in common dishes like chili or lasagna. Experiment with different cooking methods. Try roasting or broiling vegetables instead of steaming or sauteing them. Add frozen vegetables to soups, stews, pasta, or rice. Add nuts or seeds for added healthy fat at each meal. You can add these to yogurt, salads, or vegetable dishes. Marinate fish or vegetables using olive oil, lemon juice, garlic, and fresh herbs. Meal planning  Plan to eat 1 vegetarian meal one day each week. Try to work up to 2 vegetarian meals, if possible. Eat seafood 2 or more times a week. Have healthy snacks readily available, such as: Vegetable sticks with hummus. Greek yogurt. Fruit and nut trail mix. Eat balanced meals throughout the week. This includes: Fruit: 2-3 servings a day Vegetables: 4-5 servings a day Low-fat dairy: 2 servings a day Fish, poultry, or lean meat: 1 serving a day Beans and legumes: 2 or more servings a week Nuts and  seeds: 1-2 servings a day Whole grains: 6-8 servings a day Extra-virgin olive oil: 3-4 servings a day Limit red meat and sweets to only a few servings a month What are my food choices? Mediterranean diet Recommended Grains: Whole-grain pasta. Brown rice. Bulgar wheat. Polenta. Couscous. Whole-wheat bread. Orpah Cobb. Vegetables: Artichokes. Beets. Broccoli. Cabbage. Carrots. Eggplant. Green beans. Chard. Kale. Spinach. Onions. Leeks. Peas. Squash. Tomatoes. Peppers. Radishes. Fruits: Apples. Apricots. Avocado. Berries.  Bananas. Cherries. Dates. Figs. Grapes. Lemons. Melon. Oranges. Peaches. Plums. Pomegranate. Meats and other protein foods: Beans. Almonds. Sunflower seeds. Pine nuts. Peanuts. Cod. Salmon. Scallops. Shrimp. Tuna. Tilapia. Clams. Oysters. Eggs. Dairy: Low-fat milk. Cheese. Greek yogurt. Beverages: Water. Red wine. Herbal tea. Fats and oils: Extra virgin olive oil. Avocado oil. Grape seed oil. Sweets and desserts: [place] yogurt with honey. Baked apples. Poached pears. Trail mix. Seasoning and other foods: Basil. Cilantro. Coriander. Cumin. Mint. Parsley. Sage. Rosemary. Tarragon. Garlic. Oregano. Thyme. Pepper. Balsalmic vinegar. Tahini. Hummus. Tomato sauce. Olives. Mushrooms. Limit these Grains: Prepackaged pasta or rice dishes. Prepackaged cereal with added sugar. Vegetables: Deep fried potatoes (french fries). Fruits: Fruit canned in syrup. Meats and other protein foods: Beef. Pork. Lamb. Poultry with skin. Hot dogs. Tomasa Blase. Dairy: Ice cream. Sour cream. Whole milk. Beverages: Juice. Sugar-sweetened soft drinks. Beer. Liquor and spirits. Fats and oils: Butter. Canola oil. Vegetable oil. Beef fat (tallow). Lard. Sweets and desserts: Cookies. Cakes. Pies. Candy. Seasoning and other foods: Mayonnaise. Premade sauces and marinades. The items listed may not be a complete list. Talk with your dietitian about what dietary choices are right for you. Summary The Mediterranean diet includes both food and lifestyle choices. Eat a variety of fresh fruits and vegetables, beans, nuts, seeds, and whole grains. Limit the amount of red meat and sweets that you eat. Talk with your health care provider about whether it is safe for you to drink red wine in moderation. This means 1 glass a day for nonpregnant women and 2 glasses a day for men. A glass of wine equals 5 oz (150 mL). This information is not intended to replace advice given to you by your health care provider. Make sure you discuss any questions you  have with your health care provider. Document Released: 09/30/2015 Document Revised: 11/02/2015 Document Reviewed: 09/30/2015 Elsevier Interactive Patient Education  2017 [employer].
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
## 2023-04-27 ENCOUNTER — Ambulatory Visit (INDEPENDENT_AMBULATORY_CARE_PROVIDER_SITE_OTHER): Admitting: Family

## 2023-04-27 DIAGNOSIS — B353 Tinea pedis: Secondary | ICD-10-CM

## 2023-04-27 NOTE — Progress Notes (Signed)
 Office Visit Note   Patient: Jonathan Powers           Date of Birth: 1950/03/26           MRN: [id number] Visit Date: 04/27/2023              Requested by: [name], MD [address] [place],  [place] [address] PCP: [name], MD  Chief Complaint  Patient presents with   Right Foot - Wound Check   Left Foot - Wound Check      HPI: The patient is a 73 year old gentleman who returns today for painful ulcers in the webspaces of bilateral feet.  He has run out of will socks which she has been using to wick away moisture in the webspaces.  He complains of increased pain pain with weightbearing  No drainage no fever no chills Assessment & Plan: Visit Diagnoses: No diagnosis found.  Plan: Given 2 sheets of silver cell to cut to fit to place in the webspaces discussed Lamisil he would not like to try oral Lamisil.  He voiced understanding of picking up topical antifungal cream  Follow-Up Instructions: No follow-ups on file.   Ortho Exam  Patient is alert, oriented, no adenopathy, well-dressed, normal affect, normal respiratory effort. On examination bilateral lower extremities he has maceration and ulceration in the fourth webspaces bilaterally as well as the third webspace of the left foot these ulcers are tender there is no drainage no surrounding erythema no edema consistent with tinea pedis  Imaging: No results found. No images are attached to the encounter.  Labs: Lab Results  Component Value Date   HGBA1C 5.3 09/08/2021   HGBA1C 5.8 02/09/2021   HGBA1C 5.8 06/29/2020   ESRSEDRATE 38 (H) 04/23/2022   ESRSEDRATE 86 (H) 09/11/2021   ESRSEDRATE 9 07/09/2018   CRP 2.3 (H) 04/23/2022   CRP 1.2 09/15/2021   CRP 4.2 (H) 09/11/2021   LABURIC 9.0 (H) 11/28/2019   LABURIC 8.1 (H) 07/09/2018   LABURIC 6.7 02/05/2018   REPTSTATUS 08/13/2022 FINAL 08/08/2022   CULT  08/08/2022    NO GROWTH 5 DAYS Performed at [facility], [address],  [place], [place] [address]      Lab Results  Component Value Date   ALBUMIN 3.6 12/06/2022   ALBUMIN 4.1 10/22/2022   ALBUMIN 3.9 10/22/2022    Lab Results  Component Value Date   MG 2.0 09/11/2021   MG 1.9 09/10/2021   MG 1.9 09/08/2021   Lab Results  Component Value Date   VD25OH 55 07/09/2018   VD25OH 54 04/04/2018   VD25OH 46 03/01/2016    No results found for: "PREALBUMIN"    Latest Ref Rng & Units 12/06/2022    9:53 AM 10/22/2022    4:09 PM 10/22/2022   12:10 AM  CBC EXTENDED  WBC 4.0 - 10.5 K/uL 6.3  6.9  9.5   RBC 4.22 - 5.81 MIL/uL 4.90  5.47  5.50   Hemoglobin 13.0 - 17.0 g/dL 82.9  56.2  13.0   HCT 39.0 - 52.0 % 42.6  48.7  48.8   Platelets 150 - 400 K/uL 286  251  275   NEUT# 1.7 - 7.7 K/uL 4.0     Lymph# 0.7 - 4.0 K/uL 1.4        There is no height or weight on file to calculate BMI.  Orders:  No orders of the defined types were placed in this encounter.  No orders of the defined types were placed in this encounter.    Procedures: No procedures performed  Clinical Data: No additional findings.  ROS:  All other systems negative, except as noted in the HPI. Review of Systems  Objective: Vital Signs: There were no vitals taken for this visit.  Specialty Comments:  No specialty comments available.  PMFS History: Patient Active Problem List   Diagnosis Date Noted   Dyslipidemia, goal LDL below 100 01/22/2023   Need for prophylactic vaccination and inoculation against varicella 01/16/2023   Mild neurocognitive disorder due to Alzheimer's disease 09/20/2022   Exocrine pancreatic insufficiency 12/03/2021   Lung nodule 07/25/2021   Hypothyroidism 11/11/2018   Insomnia 11/10/2018   GERD (gastroesophageal reflux disease) 11/10/2018   Age related osteoporosis 03/11/2018   Flexural eczema 07/24/2017   Chronic right-sided low back pain without sciatica 07/21/2016   Superior mesenteric artery aneurysm 07/12/2015   Left thyroid nodule    OSA  (obstructive sleep apnea) 01/27/2013   Symptomatic PVCs 09/06/2012   BPH (benign prostatic hyperplasia) 09/06/2012   Obesity (BMI 30-39.9) 05/17/2006   Essential hypertension 05/17/2006   Allergic rhinitis 05/17/2006   Primary osteoarthritis 05/17/2006   Past Medical History:  Diagnosis Date   Acute recurrent pansinusitis 02/17/2021   Age related osteoporosis 03/11/2018   Allergic rhinitis 05/17/2006   BPH (benign prostatic hyperplasia) 09/06/2012   Cellulitis of left lower extremity 11/10/2018   Cellulitis, right leg    Recurrent R hip cellulitis 1/08,3/08    Chronic fatigue, unspecified    Chronic hyperglycemia 06/29/2020   Chronic right-sided low back pain without sciatica 07/21/2016   Chronic sinusitis 07/10/2021   Deviated septum 02/17/2021   Diverticulitis    Essential hypertension 05/17/2006   2014     Impression  Exercise Capacity:  Lexiscan with low level exercise.  BP Response:  Normal blood pressure response.  Clinical Symptoms:  No significant symptoms noted.  ECG Impression:  No significant ST segment change suggestive of ischemia.  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   Flexural eczema 07/24/2017   Generalized abdominal tenderness with rebound tenderness 08/02/2022   GERD (gastroesophageal reflux disease)    Gout    Hashimoto's thyroiditis    History of skin cancer    Melanoma on back   Hypogonadism male    low T, a/w ED   Hypothyroidism 11/11/2018   Idiopathic urticaria    Insomnia 11/10/2018   Left flank pain 08/02/2022   Left thyroid nodule 2008   on [place], decrease size in 2011 [place]   Lung nodule 07/25/2021   Migraines    Atypical and ocular   Mild neurocognitive disorder due to Alzheimer's disease 09/20/2022   Nasal turbinate hypertrophy 02/17/2021    Obesity (BMI 30-39.9) 05/17/2006   OSA (obstructive sleep apnea) 01/27/2013   HST 01/2013:  AHI 68/hr with obstructive and central events.   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 No acute findings.  2. Stable 1.4  cm fusiform dilatation of celiac trunk.  3. Ectatic abdominal aorta at risk for aneurysm development.  Recommend followup by ultrasound in 5 years. This recommendation  follows ACR consensus guidelines: White Paper of the ACR Incidental  Findings Committee II on Vascular Findings. J Am Coll Radiol 2013;  10:789-794.  4. 1.   Symptomatic PVCs 09/06/2012    Family History  Problem Relation Age of Onset   Arthritis Father    Heart disease Father    Diabetes Father    Vascular Disease Father        AoBifem   Arthritis Mother    Diabetes Mother    Multiple sclerosis Mother    Cancer Paternal Grandmother        "GI"   Cancer Paternal Grandfather        stomach cancer   Thyroid cancer Sister    Uterine cancer Sister     Past Surgical History:  Procedure Laterality Date   CARPAL TUNNEL RELEASE Left 03/30/2017   Procedure: CARPAL TUNNEL RELEASE;  Surgeon: Valeria Batman, MD;  Location: [facility];  Service: Orthopedics;  Laterality: Left;   CHOLECYSTECTOMY  2004   CTR Bilateral 1982   CYSTOSCOPY  1974   FOOT FUSION Left 06/22/2017   HEMORRHOID SURGERY N/A 03/30/2017   Procedure: HEMORRHOIDECTOMY;  Surgeon: Abigail Miyamoto, MD;  Location: [facility];  Service: General;  Laterality: N/A;   open reduction L little finger Left 1970   Repair digital thumb, left Left 1990   Right shoulder cuff repair Right 09/15/11   Right shoulder SAD, DCR Right  02/05/11   TOTAL HIP ARTHROPLASTY Left 07/26/06   TOTAL HIP ARTHROPLASTY Right 1999   TOTAL HIP REVISION Left 03/07/2019   Procedure: LEFT TOTAL HIP REVISION POSTERIOR APPROACH;  Surgeon: Gean Birchwood, MD;  Location: [facility];  Service: Orthopedics;  Laterality: Left;   Social History   Occupational History   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

## 2023-04-30 ENCOUNTER — Ambulatory Visit: Payer: Medicare Other | Admitting: Physician Assistant

## 2023-05-01 ENCOUNTER — Encounter: Payer: Self-pay | Admitting: Family

## 2023-05-03 ENCOUNTER — Ambulatory Visit: Admitting: Family

## 2023-05-03 DIAGNOSIS — B353 Tinea pedis: Secondary | ICD-10-CM | POA: Diagnosis not present

## 2023-05-04 ENCOUNTER — Encounter: Payer: Self-pay | Admitting: Family

## 2023-05-04 ENCOUNTER — Other Ambulatory Visit: Payer: Self-pay | Admitting: Physician Assistant
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
## 2023-05-10 ENCOUNTER — Other Ambulatory Visit: Payer: Self-pay

## 2023-05-10 ENCOUNTER — Emergency Department (HOSPITAL_COMMUNITY)

## 2023-05-10 ENCOUNTER — Emergency Department (HOSPITAL_COMMUNITY)
Admission: EM | Admit: 2023-05-10 | Discharge: 2023-05-10 | Disposition: A | Discharge status: Home / Self Care | Attending: Emergency Medicine | Admitting: Emergency Medicine

## 2023-05-10 ENCOUNTER — Encounter (HOSPITAL_COMMUNITY): Payer: Self-pay

## 2023-05-10 DIAGNOSIS — Z85828 Personal history of other malignant neoplasm of skin: Secondary | ICD-10-CM | POA: Diagnosis not present

## 2023-05-10 DIAGNOSIS — R079 Chest pain, unspecified: Secondary | ICD-10-CM | POA: Diagnosis present

## 2023-05-10 DIAGNOSIS — R0789 Other chest pain: Secondary | ICD-10-CM | POA: Diagnosis not present

## 2023-05-10 DIAGNOSIS — R1012 Left upper quadrant pain: Secondary | ICD-10-CM | POA: Insufficient documentation

## 2023-05-10 DIAGNOSIS — S299XXA Unspecified injury of thorax, initial encounter: Secondary | ICD-10-CM | POA: Insufficient documentation

## 2023-05-10 DIAGNOSIS — M8448XA Pathological fracture, other site, initial encounter for fracture: Secondary | ICD-10-CM | POA: Diagnosis not present

## 2023-05-10 DIAGNOSIS — Z79899 Other long term (current) drug therapy: Secondary | ICD-10-CM | POA: Insufficient documentation

## 2023-05-10 DIAGNOSIS — Z043 Encounter for examination and observation following other accident: Secondary | ICD-10-CM | POA: Diagnosis not present

## 2023-05-10 DIAGNOSIS — M778 Other enthesopathies, not elsewhere classified: Secondary | ICD-10-CM | POA: Diagnosis not present

## 2023-05-10 DIAGNOSIS — W010XXA Fall on same level from slipping, tripping and stumbling without subsequent striking against object, initial encounter: Secondary | ICD-10-CM | POA: Insufficient documentation

## 2023-05-10 DIAGNOSIS — M25512 Pain in left shoulder: Secondary | ICD-10-CM | POA: Diagnosis not present

## 2023-05-10 DIAGNOSIS — S3993XA Unspecified injury of pelvis, initial encounter: Secondary | ICD-10-CM | POA: Diagnosis not present

## 2023-05-10 DIAGNOSIS — Z7901 Long term (current) use of anticoagulants: Secondary | ICD-10-CM | POA: Insufficient documentation

## 2023-05-10 DIAGNOSIS — S298XXA Other specified injuries of thorax, initial encounter: Secondary | ICD-10-CM

## 2023-05-10 DIAGNOSIS — M19021 Primary osteoarthritis, right elbow: Secondary | ICD-10-CM | POA: Diagnosis not present

## 2023-05-10 DIAGNOSIS — R0781 Pleurodynia: Secondary | ICD-10-CM | POA: Diagnosis not present

## 2023-05-10 DIAGNOSIS — M19012 Primary osteoarthritis, left shoulder: Secondary | ICD-10-CM | POA: Diagnosis not present

## 2023-05-10 DIAGNOSIS — I10 Essential (primary) hypertension: Secondary | ICD-10-CM | POA: Insufficient documentation

## 2023-05-10 DIAGNOSIS — E039 Hypothyroidism, unspecified: Secondary | ICD-10-CM | POA: Diagnosis not present

## 2023-05-10 DIAGNOSIS — S3991XA Unspecified injury of abdomen, initial encounter: Secondary | ICD-10-CM | POA: Diagnosis not present

## 2023-05-10 DIAGNOSIS — S50311A Abrasion of right elbow, initial encounter: Secondary | ICD-10-CM | POA: Diagnosis not present

## 2023-05-10 LAB — COMPREHENSIVE METABOLIC PANEL
ALT: 18 U/L (ref 0–44)
AST: 30 U/L (ref 15–41)
Albumin: 4.4 g/dL (ref 3.5–5.0)
Alkaline Phosphatase: 51 U/L (ref 38–126)
Anion gap: 11 (ref 5–15)
BUN: 20 mg/dL (ref 8–23)
CO2: 26 mmol/L (ref 22–32)
Calcium: 9.5 mg/dL (ref 8.9–10.3)
Chloride: 100 mmol/L (ref 98–111)
Creatinine, Ser: 0.84 mg/dL (ref 0.61–1.24)
GFR, Estimated: >60 mL/min (ref >60)
Glucose, Bld: 103 mg/dL — ABNORMAL HIGH (ref 70–99)
Potassium: 4.2 mmol/L (ref 3.5–5.1)
Sodium: 137 mmol/L (ref 135–145)
Total Bilirubin: 1.3 mg/dL — ABNORMAL HIGH (ref 0.0–1.2)
Total Protein: 7.5 g/dL (ref 6.5–8.1)

## 2023-05-10 LAB — CBC
HCT: 47 % (ref 39.0–52.0)
Hemoglobin: 16 g/dL (ref 13.0–17.0)
MCH: 30.9 pg (ref 26.0–34.0)
MCHC: 34 g/dL (ref 30.0–36.0)
MCV: 90.7 fL (ref 80.0–100.0)
Platelets: 285 10*3/uL (ref 150–400)
RBC: 5.18 MIL/uL (ref 4.22–5.81)
RDW: 12.4 % (ref 11.5–15.5)
WBC: 6.9 10*3/uL (ref 4.0–10.5)
nRBC: 0 % (ref 0.0–0.2)

## 2023-05-10 MED ORDER — ACETAMINOPHEN 500 MG PO TABS
1000.0000 mg | ORAL_TABLET | Freq: Once | ORAL | Status: AC
  Administered 2023-05-10: 1000 mg via ORAL
  Filled 2023-05-10: qty 2

## 2023-05-10 MED ORDER — HYDROCODONE-ACETAMINOPHEN 5-325 MG PO TABS: 1.0000 | ORAL_TABLET | Freq: Four times a day (QID) | ORAL | 0 refills | Status: DC | PRN

## 2023-05-10 MED ORDER — IOHEXOL 300 MG/ML  SOLN
100.0000 mL | Freq: Once | INTRAMUSCULAR | Status: AC | PRN
  Administered 2023-05-10: 100 mL via INTRAVENOUS

## 2023-05-10 MED ORDER — IOHEXOL 300 MG/ML  SOLN: 100.0000 mL | Freq: Once | INTRAMUSCULAR | Status: DC | PRN
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
## 2023-05-10 NOTE — ED Provider Notes (Signed)
 [facility] AT [facility] Provider Note   CSN: [id number] Arrival date & time: 05/10/23  1853     History  Chief Complaint  Patient presents with   Marletta Lor    Jonathan Powers is a 73 y.o. male.   Fall  Patient presents after fall.  Lost his balance and tripped.  Complaining of pain in left elbow left chest.  Also some mild right elbow pain.  Did not hit head.  Not on blood thinners.  Hurts to move and breathe.    Past Medical History:  Diagnosis Date   Acute recurrent pansinusitis 02/17/2021   Age related osteoporosis 03/11/2018   Allergic rhinitis 05/17/2006   BPH (benign prostatic hyperplasia) 09/06/2012   Cellulitis of left lower extremity 11/10/2018   Cellulitis, right leg    Recurrent R hip cellulitis 1/08,3/08    Chronic fatigue, unspecified    Chronic hyperglycemia 06/29/2020   Chronic right-sided low back pain without sciatica 07/21/2016   Chronic sinusitis 07/10/2021   Deviated septum 02/17/2021   Diverticulitis    Essential hypertension 05/17/2006   2014     Impression  Exercise Capacity:  Lexiscan with low level exercise.  BP Response:  Normal blood pressure response.  Clinical Symptoms:  No significant symptoms noted.  ECG Impression:  No significant ST segment change suggestive of ischemia.  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   Flexural eczema 07/24/2017   Generalized abdominal tenderness with rebound tenderness 08/02/2022   GERD (gastroesophageal reflux disease)    Gout    Hashimoto's thyroiditis    History of skin cancer    Melanoma on back   Hypogonadism male    low T, a/w ED   Hypothyroidism 11/11/2018   Idiopathic urticaria    Insomnia 11/10/2018   Left flank pain 08/02/2022   Left thyroid nodule 2008   on [place],  decrease size in 2011 [place]   Lung nodule 07/25/2021   Migraines    Atypical and ocular   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 No acute findings.  2. 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  3. Ectatic abdominal aorta at risk for aneurysm development.  Recommend followup by ultrasound in 5 years. This recommendation  follows ACR consensus guidelines: White Paper of the ACR Incidental  Findings Committee II on Vascular Findings. J Am Coll Radiol 2013;  10:789-794.  4. 1.   Symptomatic PVCs 09/06/2012    Home Medications Prior to Admission medications   Medication Sig Start Date End Date Taking? Authorizing Provider  HYDROcodone-acetaminophen (NORCO/VICODIN) 5-325 MG tablet Take 1-2 tablets by mouth every 6 (six) hours as needed. 05/10/23  Yes Benjiman Core, MD  cetirizine (ZYRTEC) 10 MG tablet  Take 10 mg by mouth daily as needed for allergies.    [provider]  dicyclomine (BENTYL) 10 MG capsule Take 1 capsule (10 mg total) by mouth 3 (three) times daily before meals. Patient not taking: Reported on 01/16/2023 01/09/23   Meryl Dare, MD  hydrOXYzine (ATARAX) 25 MG tablet Take 25 mg by mouth at bedtime. 02/26/23   [provider]  levothyroxine (SYNTHROID) 175 MCG tablet TAKE 1 TABLET BY MOUTH ONCE DAILY BEFORE BREAKFAST 02/28/23   [name], MD  memantine (NAMENDA) 10 MG tablet Take 1 tablet (10 mg total)  by mouth 2 (two) times daily. 04/25/23   Marcos Eke, PA-C  Olmesartan Medoxomil (BENICAR PO) Take by mouth.    [provider]  rosuvastatin (CRESTOR) 5 MG tablet Take 1 tablet (5 mg total) by mouth daily. 01/22/23   [name], MD  Testosterone 1.62 % GEL Apply 40.5 mg topically in the morning. 11/26/20   [provider]  traZODone (DESYREL) 100 MG tablet TAKE 1 TABLET BY MOUTH AT BEDTIME AS NEEDED FOR SLEEP 03/04/23   [name], MD  apixaban (ELIQUIS) 2.5 MG TABS tablet Take 1 tablet (2.5 mg total) by mouth 2 (two) times daily. 03/07/19 05/16/19  Allena Katz, PA-C      Allergies    Nsaids and Tolmetin    Review of Systems   Review of Systems  Physical Exam Updated Vital Signs BP 133/73 (BP Location: Left Arm)   Pulse 67   Temp 98.8 F (37.1 C) (Oral)   Resp 16   Ht 5\' 10"  (1.778 m)   Wt 122.5 kg   SpO2 96%   BMI 38.74 kg/m  Physical Exam Vitals and nursing note reviewed.  Cardiovascular:     Rate and Rhythm: Regular rhythm.  Pulmonary:     Comments: Tenderness to left anterior chest wall.  No subcu emphysema. Chest:     Chest wall: Tenderness present.  Abdominal:     Tenderness: There is abdominal tenderness.     Comments: Left upper quadrant tenderness without rebound or guarding.  No hernia palpated.  Musculoskeletal:        General: No tenderness.  Neurological:     Mental Status: He is alert and oriented to person, place, and time.     ED Results / Procedures / Treatments   Labs (all labs ordered are listed, but only abnormal results are displayed) Labs Reviewed  COMPREHENSIVE METABOLIC PANEL - Abnormal; Notable for the following components:      Result Value   Glucose, Bld 103 (*)    Total Bilirubin 1.3 (*)    All other components within normal limits  CBC    EKG None  Radiology CT CHEST ABDOMEN PELVIS W CONTRAST Result Date: 05/10/2023 CLINICAL DATA:  Blunt polytrauma, pain in left side of chest/ribs and right elbow  after fall. History of chest fracture on the left side. EXAM: CT CHEST, ABDOMEN, AND PELVIS WITH CONTRAST TECHNIQUE: Multidetector CT imaging of the chest, abdomen and pelvis was performed following the standard protocol during bolus administration of intravenous contrast. RADIATION DOSE REDUCTION: This exam was performed according to the departmental dose-optimization program which includes automated exposure control, adjustment of the mA and/or kV according to patient size and/or use of iterative reconstruction technique. CONTRAST:  OMNIPAQUE IOHEXOL 300 MG/ML  SOLN COMPARISON:  Same day rib radiographs; CT abdomen pelvis 12/06/2022; CTA chest 06/24/2022 FINDINGS: CT CHEST FINDINGS Cardiovascular: No pericardial effusion. No evidence  of acute aortic injury. Coronary artery and aortic atherosclerotic calcification. Mediastinum/Nodes: Trachea and esophagus are unremarkable. No mediastinal hematoma. Lungs/Pleura: No focal consolidation, pleural effusion, or pneumothorax. Musculoskeletal: No acute fracture. CT ABDOMEN PELVIS FINDINGS Hepatobiliary: Hepatic steatosis. Cholecystectomy. No acute abnormality. Pancreas: Unremarkable. Spleen: No acute injury. Adrenals/Urinary Tract: Unremarkable adrenal glands. No renal injury. No hydronephrosis. The bladder is obscured by streak artifact. Stomach/Bowel: No bowel obstruction or bowel wall thickening. Stomach and appendix are within normal limits. Vascular/Lymphatic: Aortic atherosclerotic calcification. No lymphadenopathy. Reproductive: Obscured by streak artifact. Other: No free intraperitoneal fluid or air. Musculoskeletal: Bilateral hip arthroplasties. IMPRESSION: 1. No evidence of acute traumatic injury in the chest, abdomen, or pelvis. 2. Hepatic steatosis. 3. Aortic Atherosclerosis (ICD10-I70.0). Electronically Signed   By: Minerva Fester M.D.   On: 05/10/2023 22:11   DG Ribs Unilateral W/Chest Left Result Date: 05/10/2023 CLINICAL DATA:  Larey Seat at store today  landing on left side. Left anterior rib pain. Left shoulder pain. Right elbow abrasion and active bleeding. EXAM: LEFT RIBS AND CHEST - 3+ VIEW COMPARISON:  07/12/2022 FINDINGS: Normal cardiomediastinal silhouette. No focal consolidation, pleural effusion, or pneumothorax. No displaced rib fractures. Chronic left anterior seventh rib fracture. IMPRESSION: No acute cardiopulmonary disease. Electronically Signed   By: Minerva Fester M.D.   On: 05/10/2023 20:11   DG Elbow Complete Right Result Date: 05/10/2023 CLINICAL DATA:  Larey Seat at store today landing on left side. Left anterior rib pain. Left shoulder pain. Right elbow abrasion and active bleeding. EXAM: RIGHT ELBOW - COMPLETE 3+ VIEW COMPARISON:  None Available. FINDINGS: No acute fracture or dislocation. No elbow joint effusion. Degenerative arthritis with chronic degenerative fragments about the medial epicondyle and coronoid process. Small olecranon enthesophyte. IMPRESSION: No acute fracture or dislocation. Electronically Signed   By: Minerva Fester M.D.   On: 05/10/2023 20:10   DG Scapula Left Result Date: 05/10/2023 CLINICAL DATA:  Larey Seat at store today landing on left side. Left anterior rib pain. Left shoulder pain. Right elbow abrasion and active bleeding. EXAM: LEFT SCAPULA - 2+ VIEWS COMPARISON:  07/12/2022 FINDINGS: No acute fracture or dislocation. Degenerative arthritis glenohumeral and AC joints. IMPRESSION: No acute fracture or dislocation. Electronically Signed   By: Minerva Fester M.D.   On: 05/10/2023 20:08    Procedures Procedures    Medications Ordered in ED Medications  acetaminophen (TYLENOL) tablet 1,000 mg (1,000 mg Oral Given 05/10/23 1926)  iohexol (OMNIPAQUE) 300 MG/ML solution 100 mL (100 mLs Intravenous Contrast Given 05/10/23 2155)    ED Course/ Medical Decision Making/ A&P                                 Medical Decision Making Amount and/or Complexity of Data Reviewed Labs: ordered. Radiology:  ordered.  Risk Prescription drug management.   Patient with fall.  Direct blow to chest.  Differential diagnoses long but includes causes such as rib fractures but also with the left upper quadrant tenderness splenic injury.  X-rays reassuring.  However will get CT scan to evaluate for occult rib fractures and splenic injury.  CT scan reassuring.  No acute fracture.  Will treat symptomatically.  Will give pain medicines due to blunt chest trauma.  Discharge home.        Final Clinical Impression(s) / ED Diagnoses Final diagnoses:  Blunt chest trauma, initial encounter    Rx / DC Orders ED Discharge Orders  Ordered    HYDROcodone-acetaminophen (NORCO/VICODIN) 5-325 MG tablet  Every 6 hours PRN        05/10/23 2229              Benjiman Core, MD 05/10/23 2232

## 2023-05-16 ENCOUNTER — Encounter: Payer: Self-pay | Admitting: Family

## 2023-05-16 ENCOUNTER — Ambulatory Visit (INDEPENDENT_AMBULATORY_CARE_PROVIDER_SITE_OTHER): Admitting: Family

## 2023-05-16 ENCOUNTER — Other Ambulatory Visit: Payer: Self-pay

## 2023-05-16 DIAGNOSIS — M79671 Pain in right foot: Secondary | ICD-10-CM

## 2023-05-16 MED ORDER — CEPHALEXIN 500 MG PO CAPS: 500.0000 mg | ORAL_CAPSULE | Freq: Three times a day (TID) | ORAL | 0 refills | Status: DC

## 2023-05-16 MED ORDER — TERBINAFINE HCL 250 MG PO TABS: 250.0000 mg | ORAL_TABLET | Freq: Every day | ORAL | 0 refills | Status: DC

## 2023-05-16 NOTE — Progress Notes (Signed)
 Office Visit Note   Patient: Jonathan Powers           Date of Birth: [birth year]           MRN: [id number] Visit Date: 05/16/2023              Requested by: [name], MD [address] [place],  [place] [address] PCP: [name], MD  No chief complaint on file.     HPI: The patient is a 73 year old gentleman who is well-known to our office who presents today for evaluation of ulcers in the third and fourth webspaces as well as intense pain to the fourth and fifth metatarsals especially between the 2 metatarsals.  He is concerned that he may have an infection.  Reports that he has previously had similar symptoms and these resolved with a course of antibiotics.  Denies injury  Assessment & Plan: Visit Diagnoses:  1. Right foot pain     Plan: Reassurance provided.  Discussed his tinea pedis at length.  Have called in a prescription for Lamisil again to the [facility].  Silver cell given and placed in the webspaces today instructed on home care.   Follow-Up Instructions: Return in about 4 weeks (around 06/13/2023), or if symptoms worsen or fail to improve.   Ortho Exam  Patient is alert, oriented, no adenopathy, well-dressed, normal affect, normal respiratory effort. On examination left lower extremity the third and fourth webspaces are macerated there is denuded skin there is mild erythema there is no erythema or warmth or edema of the foot he has tenderness along the length of the fourth metatarsal there is no ecchymosis  Imaging: No results found. No images are attached to the encounter.  Labs: Lab Results  Component Value Date   HGBA1C 5.3 09/08/2021   HGBA1C 5.8 02/09/2021   HGBA1C 5.8 06/29/2020   ESRSEDRATE 38 (H) 04/23/2022   ESRSEDRATE 86 (H) 09/11/2021   ESRSEDRATE 9 07/09/2018   CRP 2.3 (H) 04/23/2022   CRP 1.2 09/15/2021   CRP 4.2 (H) 09/11/2021   LABURIC 9.0 (H) 11/28/2019   LABURIC 8.1 (H) 07/09/2018   LABURIC 6.7 02/05/2018    REPTSTATUS 08/13/2022 FINAL 08/08/2022   CULT  08/08/2022    NO GROWTH 5 DAYS Performed at [facility], [address] [place], [place] [address]      Lab Results  Component Value Date   ALBUMIN 4.4 05/10/2023   ALBUMIN 3.6 12/06/2022   ALBUMIN 4.1 10/22/2022    Lab Results  Component Value Date   MG 2.0 09/11/2021   MG 1.9 09/10/2021   MG 1.9 09/08/2021   Lab Results  Component Value Date   VD25OH 55 07/09/2018   VD25OH 54 04/04/2018   VD25OH 46 03/01/2016    No results found for: "PREALBUMIN"    Latest Ref Rng & Units 05/10/2023    8:58 PM 12/06/2022    9:53 AM 10/22/2022    4:09 PM  CBC EXTENDED  WBC 4.0 - 10.5 K/uL 6.9  6.3  6.9   RBC 4.22 - 5.81 MIL/uL 5.18  4.90  5.47   Hemoglobin 13.0 - 17.0 g/dL 86.5  78.4  69.6   HCT 39.0 - 52.0 % 47.0  42.6  48.7   Platelets 150 - 400 K/uL 285  286  251   NEUT# 1.7 - 7.7 K/uL  4.0    Lymph# 0.7 - 4.0 K/uL  1.4       There is  no height or weight on file to calculate BMI.  Orders:  Orders Placed This Encounter  Procedures   XR Foot 2 Views Left   Meds ordered this encounter  Medications   cephALEXin (KEFLEX) 500 MG capsule    Sig: Take 1 capsule (500 mg total) by mouth 3 (three) times daily.    Dispense:  21 capsule    Refill:  0   terbinafine (LAMISIL) 250 MG tablet    Sig: Take 1 tablet (250 mg total) by mouth daily.    Dispense:  42 tablet    Refill:  0     Procedures: No procedures performed  Clinical Data: No additional findings.  ROS:  All other systems negative, except as noted in the HPI. Review of Systems  Objective: Vital Signs: There were no vitals taken for this visit.  Specialty Comments:  No specialty comments available.  PMFS History: Patient Active Problem List   Diagnosis Date Noted   Dyslipidemia, goal LDL below 100 01/22/2023   Need for prophylactic vaccination and inoculation against varicella 01/16/2023   Mild neurocognitive disorder due to Alzheimer's disease  09/20/2022   Exocrine pancreatic insufficiency 12/03/2021   Lung nodule 07/25/2021   Hypothyroidism 11/11/2018   Insomnia 11/10/2018   GERD (gastroesophageal reflux disease) 11/10/2018   Age related osteoporosis 03/11/2018   Flexural eczema 07/24/2017   Chronic right-sided low back pain without sciatica 07/21/2016   Superior mesenteric artery aneurysm 07/12/2015   Left thyroid nodule    OSA (obstructive sleep apnea) 01/27/2013   Symptomatic PVCs 09/06/2012   BPH (benign prostatic hyperplasia) 09/06/2012   Obesity (BMI 30-39.9) 05/17/2006   Essential hypertension 05/17/2006   Allergic rhinitis 05/17/2006   Primary osteoarthritis 05/17/2006   Past Medical History:  Diagnosis Date   Acute recurrent pansinusitis 02/17/2021   Age related osteoporosis 03/11/2018   Allergic rhinitis 05/17/2006   BPH (benign prostatic hyperplasia) 09/06/2012   Cellulitis of left lower extremity 11/10/2018   Cellulitis, right leg    Recurrent R hip cellulitis 1/08,3/08    Chronic fatigue, unspecified    Chronic hyperglycemia 06/29/2020   Chronic right-sided low back pain without sciatica 07/21/2016   Chronic sinusitis 07/10/2021   Deviated septum 02/17/2021   Diverticulitis    Essential hypertension 05/17/2006   2014     Impression  Exercise Capacity:  Lexiscan with low level exercise.  BP Response:  Normal blood pressure response.  Clinical Symptoms:  No significant symptoms noted.  ECG Impression:  No significant ST segment change suggestive of ischemia.  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   Flexural eczema 07/24/2017   Generalized abdominal tenderness with rebound tenderness 08/02/2022   GERD (gastroesophageal reflux disease)    Gout    Hashimoto's thyroiditis    History of skin cancer     Melanoma on back   Hypogonadism male    low T, a/w ED   Hypothyroidism 11/11/2018   Idiopathic urticaria    Insomnia 11/10/2018   Left flank pain 08/02/2022   Left thyroid nodule 2008   on [place], decrease size in 2011 [place]   Lung nodule 07/25/2021   Migraines    Atypical and ocular   Mild neurocognitive disorder due to Alzheimer's disease 09/20/2022   Nasal turbinate hypertrophy 02/17/2021   Obesity (BMI 30-39.9) 05/17/2006  OSA (obstructive sleep apnea) 01/27/2013   HST 01/2013:  AHI 68/hr with obstructive and central events.   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 No acute findings.  2. 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  3. Ectatic abdominal aorta at risk for aneurysm development.  Recommend followup by ultrasound in 5 years. This recommendation  follows ACR consensus guidelines: White Paper of the ACR Incidental  Findings Committee II on Vascular Findings. J Am Coll Radiol 2013;  10:789-794.  4. 1.   Symptomatic PVCs 09/06/2012    Family History  Problem Relation Age of Onset   Arthritis Father    Heart disease Father    Diabetes Father    Vascular Disease Father        AoBifem   Arthritis Mother    Diabetes Mother    Multiple sclerosis Mother    Cancer Paternal Grandmother        "GI"   Cancer Paternal Grandfather        stomach cancer   Thyroid cancer Sister    Uterine cancer Sister     Past Surgical History:  Procedure Laterality Date   CARPAL TUNNEL RELEASE Left 03/30/2017   Procedure: CARPAL TUNNEL RELEASE;  Surgeon: Valeria Batman, MD;  Location: [facility];  Service: Orthopedics;  Laterality: Left;   CHOLECYSTECTOMY  2004   CTR  Bilateral 1982   CYSTOSCOPY  1974   FOOT FUSION Left 06/22/2017   HEMORRHOID SURGERY N/A 03/30/2017   Procedure: HEMORRHOIDECTOMY;  Surgeon: Abigail Miyamoto, MD;  Location: [facility];  Service: General;  Laterality: N/A;   open reduction L little finger Left 1970   Repair digital thumb, left Left 1990   Right shoulder cuff repair Right 09/15/11   Right shoulder SAD, DCR Right 02/05/11   TOTAL HIP ARTHROPLASTY Left 07/26/06   TOTAL HIP ARTHROPLASTY Right 1999   TOTAL HIP REVISION Left 03/07/2019   Procedure: LEFT TOTAL HIP REVISION POSTERIOR APPROACH;  Surgeon: Gean Birchwood, MD;  Location: [facility];  Service: Orthopedics;  Laterality: Left;   Social History   Occupational History   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

## 2023-05-17 ENCOUNTER — Telehealth: Payer: Self-pay | Admitting: Family

## 2023-05-17 NOTE — Telephone Encounter (Signed)
 Pt's daughter [name] request a call from Denny Peon to discuss patient's care

## 2023-05-26 ENCOUNTER — Emergency Department (HOSPITAL_COMMUNITY)

## 2023-05-26 ENCOUNTER — Inpatient Hospital Stay (HOSPITAL_COMMUNITY)
Admission: EM | Admit: 2023-05-26 | Discharge: 2023-05-31 | DRG: 603 | Disposition: A | Discharge status: Home / Self Care | Attending: Internal Medicine | Admitting: Internal Medicine

## 2023-05-26 ENCOUNTER — Encounter (HOSPITAL_COMMUNITY): Payer: Self-pay | Admitting: *Deleted

## 2023-05-26 ENCOUNTER — Other Ambulatory Visit: Payer: Self-pay

## 2023-05-26 DIAGNOSIS — Z886 Allergy status to analgesic agent status: Secondary | ICD-10-CM

## 2023-05-26 DIAGNOSIS — Z66 Do not resuscitate: Secondary | ICD-10-CM | POA: Diagnosis present

## 2023-05-26 DIAGNOSIS — G4733 Obstructive sleep apnea (adult) (pediatric): Secondary | ICD-10-CM | POA: Diagnosis present

## 2023-05-26 DIAGNOSIS — R1012 Left upper quadrant pain: Secondary | ICD-10-CM | POA: Diagnosis present

## 2023-05-26 DIAGNOSIS — L03115 Cellulitis of right lower limb: Secondary | ICD-10-CM | POA: Diagnosis not present

## 2023-05-26 DIAGNOSIS — L0390 Cellulitis, unspecified: Secondary | ICD-10-CM | POA: Diagnosis not present

## 2023-05-26 DIAGNOSIS — F05 Delirium due to known physiological condition: Secondary | ICD-10-CM | POA: Diagnosis not present

## 2023-05-26 DIAGNOSIS — Z888 Allergy status to other drugs, medicaments and biological substances status: Secondary | ICD-10-CM

## 2023-05-26 DIAGNOSIS — Z79899 Other long term (current) drug therapy: Secondary | ICD-10-CM

## 2023-05-26 DIAGNOSIS — M810 Age-related osteoporosis without current pathological fracture: Secondary | ICD-10-CM | POA: Diagnosis present

## 2023-05-26 DIAGNOSIS — B351 Tinea unguium: Secondary | ICD-10-CM | POA: Diagnosis present

## 2023-05-26 DIAGNOSIS — G8929 Other chronic pain: Secondary | ICD-10-CM | POA: Diagnosis present

## 2023-05-26 DIAGNOSIS — K219 Gastro-esophageal reflux disease without esophagitis: Secondary | ICD-10-CM | POA: Diagnosis present

## 2023-05-26 DIAGNOSIS — Z8261 Family history of arthritis: Secondary | ICD-10-CM

## 2023-05-26 DIAGNOSIS — G309 Alzheimer's disease, unspecified: Secondary | ICD-10-CM | POA: Diagnosis present

## 2023-05-26 DIAGNOSIS — Z85820 Personal history of malignant melanoma of skin: Secondary | ICD-10-CM

## 2023-05-26 DIAGNOSIS — R0602 Shortness of breath: Secondary | ICD-10-CM | POA: Diagnosis not present

## 2023-05-26 DIAGNOSIS — Z6838 Body mass index (BMI) 38.0-38.9, adult: Secondary | ICD-10-CM

## 2023-05-26 DIAGNOSIS — I10 Essential (primary) hypertension: Secondary | ICD-10-CM | POA: Diagnosis present

## 2023-05-26 DIAGNOSIS — Z800 Family history of malignant neoplasm of digestive organs: Secondary | ICD-10-CM

## 2023-05-26 DIAGNOSIS — R739 Hyperglycemia, unspecified: Secondary | ICD-10-CM | POA: Diagnosis present

## 2023-05-26 DIAGNOSIS — Z1152 Encounter for screening for COVID-19: Secondary | ICD-10-CM

## 2023-05-26 DIAGNOSIS — M109 Gout, unspecified: Secondary | ICD-10-CM | POA: Diagnosis present

## 2023-05-26 DIAGNOSIS — F0390 Unspecified dementia without behavioral disturbance: Secondary | ICD-10-CM | POA: Diagnosis present

## 2023-05-26 DIAGNOSIS — I2489 Other forms of acute ischemic heart disease: Secondary | ICD-10-CM | POA: Diagnosis present

## 2023-05-26 DIAGNOSIS — N400 Enlarged prostate without lower urinary tract symptoms: Secondary | ICD-10-CM | POA: Diagnosis present

## 2023-05-26 DIAGNOSIS — E063 Autoimmune thyroiditis: Secondary | ICD-10-CM | POA: Diagnosis present

## 2023-05-26 DIAGNOSIS — Z79890 Hormone replacement therapy (postmenopausal): Secondary | ICD-10-CM

## 2023-05-26 DIAGNOSIS — Z820 Family history of epilepsy and other diseases of the nervous system: Secondary | ICD-10-CM

## 2023-05-26 DIAGNOSIS — E785 Hyperlipidemia, unspecified: Secondary | ICD-10-CM | POA: Diagnosis present

## 2023-05-26 DIAGNOSIS — Z8249 Family history of ischemic heart disease and other diseases of the circulatory system: Secondary | ICD-10-CM

## 2023-05-26 DIAGNOSIS — B353 Tinea pedis: Secondary | ICD-10-CM | POA: Diagnosis present

## 2023-05-26 DIAGNOSIS — E291 Testicular hypofunction: Secondary | ICD-10-CM | POA: Diagnosis present

## 2023-05-26 DIAGNOSIS — Z808 Family history of malignant neoplasm of other organs or systems: Secondary | ICD-10-CM

## 2023-05-26 DIAGNOSIS — F02A0 Dementia in other diseases classified elsewhere, mild, without behavioral disturbance, psychotic disturbance, mood disturbance, and anxiety: Secondary | ICD-10-CM | POA: Diagnosis present

## 2023-05-26 DIAGNOSIS — Z96643 Presence of artificial hip joint, bilateral: Secondary | ICD-10-CM | POA: Diagnosis present

## 2023-05-26 DIAGNOSIS — Z833 Family history of diabetes mellitus: Secondary | ICD-10-CM

## 2023-05-26 DIAGNOSIS — Z85828 Personal history of other malignant neoplasm of skin: Secondary | ICD-10-CM

## 2023-05-26 NOTE — ED Triage Notes (Signed)
 The pt is c/o a temp shortness of breath rash and redness to his rt leg      some chest pain cough  dry

## 2023-05-26 NOTE — ED Notes (Signed)
 Pt states he had previously been waiting for 3 hours, not realizing he was not checked in. Pt checked in at this time and vitals taken.

## 2023-05-27 ENCOUNTER — Encounter (HOSPITAL_COMMUNITY): Payer: Self-pay | Admitting: Internal Medicine

## 2023-05-27 ENCOUNTER — Emergency Department (HOSPITAL_COMMUNITY)

## 2023-05-27 ENCOUNTER — Inpatient Hospital Stay (HOSPITAL_COMMUNITY)

## 2023-05-27 DIAGNOSIS — B351 Tinea unguium: Secondary | ICD-10-CM | POA: Diagnosis not present

## 2023-05-27 DIAGNOSIS — Z79899 Other long term (current) drug therapy: Secondary | ICD-10-CM | POA: Diagnosis not present

## 2023-05-27 DIAGNOSIS — Z85828 Personal history of other malignant neoplasm of skin: Secondary | ICD-10-CM | POA: Diagnosis not present

## 2023-05-27 DIAGNOSIS — I2489 Other forms of acute ischemic heart disease: Secondary | ICD-10-CM | POA: Diagnosis not present

## 2023-05-27 DIAGNOSIS — N400 Enlarged prostate without lower urinary tract symptoms: Secondary | ICD-10-CM | POA: Diagnosis not present

## 2023-05-27 DIAGNOSIS — R109 Unspecified abdominal pain: Secondary | ICD-10-CM | POA: Diagnosis not present

## 2023-05-27 DIAGNOSIS — F05 Delirium due to known physiological condition: Secondary | ICD-10-CM | POA: Diagnosis not present

## 2023-05-27 DIAGNOSIS — L03115 Cellulitis of right lower limb: Secondary | ICD-10-CM

## 2023-05-27 DIAGNOSIS — I2510 Atherosclerotic heart disease of native coronary artery without angina pectoris: Secondary | ICD-10-CM | POA: Diagnosis not present

## 2023-05-27 DIAGNOSIS — R609 Edema, unspecified: Secondary | ICD-10-CM

## 2023-05-27 DIAGNOSIS — Z79890 Hormone replacement therapy (postmenopausal): Secondary | ICD-10-CM | POA: Diagnosis not present

## 2023-05-27 DIAGNOSIS — G8929 Other chronic pain: Secondary | ICD-10-CM | POA: Diagnosis not present

## 2023-05-27 DIAGNOSIS — R059 Cough, unspecified: Secondary | ICD-10-CM | POA: Diagnosis not present

## 2023-05-27 DIAGNOSIS — L0390 Cellulitis, unspecified: Secondary | ICD-10-CM | POA: Diagnosis not present

## 2023-05-27 DIAGNOSIS — F03A0 Unspecified dementia, mild, without behavioral disturbance, psychotic disturbance, mood disturbance, and anxiety: Secondary | ICD-10-CM | POA: Diagnosis not present

## 2023-05-27 DIAGNOSIS — F02A0 Dementia in other diseases classified elsewhere, mild, without behavioral disturbance, psychotic disturbance, mood disturbance, and anxiety: Secondary | ICD-10-CM | POA: Diagnosis not present

## 2023-05-27 DIAGNOSIS — Z6838 Body mass index (BMI) 38.0-38.9, adult: Secondary | ICD-10-CM | POA: Diagnosis not present

## 2023-05-27 DIAGNOSIS — Z66 Do not resuscitate: Secondary | ICD-10-CM | POA: Diagnosis not present

## 2023-05-27 DIAGNOSIS — E063 Autoimmune thyroiditis: Secondary | ICD-10-CM | POA: Diagnosis not present

## 2023-05-27 DIAGNOSIS — E785 Hyperlipidemia, unspecified: Secondary | ICD-10-CM | POA: Diagnosis not present

## 2023-05-27 DIAGNOSIS — K219 Gastro-esophageal reflux disease without esophagitis: Secondary | ICD-10-CM | POA: Diagnosis not present

## 2023-05-27 DIAGNOSIS — Z85820 Personal history of malignant melanoma of skin: Secondary | ICD-10-CM | POA: Diagnosis not present

## 2023-05-27 DIAGNOSIS — R079 Chest pain, unspecified: Secondary | ICD-10-CM | POA: Diagnosis not present

## 2023-05-27 DIAGNOSIS — M40204 Unspecified kyphosis, thoracic region: Secondary | ICD-10-CM | POA: Diagnosis not present

## 2023-05-27 DIAGNOSIS — Z1152 Encounter for screening for COVID-19: Secondary | ICD-10-CM | POA: Diagnosis not present

## 2023-05-27 DIAGNOSIS — R1012 Left upper quadrant pain: Secondary | ICD-10-CM | POA: Diagnosis not present

## 2023-05-27 DIAGNOSIS — R937 Abnormal findings on diagnostic imaging of other parts of musculoskeletal system: Secondary | ICD-10-CM | POA: Diagnosis not present

## 2023-05-27 DIAGNOSIS — I10 Essential (primary) hypertension: Secondary | ICD-10-CM | POA: Diagnosis not present

## 2023-05-27 DIAGNOSIS — G4733 Obstructive sleep apnea (adult) (pediatric): Secondary | ICD-10-CM | POA: Diagnosis not present

## 2023-05-27 DIAGNOSIS — M109 Gout, unspecified: Secondary | ICD-10-CM | POA: Diagnosis not present

## 2023-05-27 DIAGNOSIS — R0602 Shortness of breath: Secondary | ICD-10-CM | POA: Diagnosis not present

## 2023-05-27 DIAGNOSIS — B353 Tinea pedis: Secondary | ICD-10-CM | POA: Diagnosis not present

## 2023-05-27 DIAGNOSIS — G309 Alzheimer's disease, unspecified: Secondary | ICD-10-CM | POA: Diagnosis not present

## 2023-05-27 LAB — URINALYSIS, W/ REFLEX TO CULTURE (INFECTION SUSPECTED)
Bacteria, UA: NONE SEEN
Bilirubin Urine: NEGATIVE
Glucose, UA: 50 mg/dL — AB
Hgb urine dipstick: NEGATIVE
Ketones, ur: 5 mg/dL — AB
Leukocytes,Ua: NEGATIVE
Nitrite: NEGATIVE
Protein, ur: NEGATIVE mg/dL
Specific Gravity, Urine: 1.027 (ref 1.005–1.030)
pH: 5 (ref 5.0–8.0)

## 2023-05-27 LAB — COMPREHENSIVE METABOLIC PANEL WITH GFR
ALT: 21 U/L (ref 0–44)
AST: 25 U/L (ref 15–41)
Albumin: 3.8 g/dL (ref 3.5–5.0)
Alkaline Phosphatase: 44 U/L (ref 38–126)
Anion gap: 12 (ref 5–15)
BUN: 16 mg/dL (ref 8–23)
CO2: 22 mmol/L (ref 22–32)
Calcium: 9.2 mg/dL (ref 8.9–10.3)
Chloride: 101 mmol/L (ref 98–111)
Creatinine, Ser: 1.17 mg/dL (ref 0.61–1.24)
GFR, Estimated: >60 mL/min (ref >60)
Glucose, Bld: 152 mg/dL — ABNORMAL HIGH (ref 70–99)
Potassium: 3.9 mmol/L (ref 3.5–5.1)
Sodium: 135 mmol/L (ref 135–145)
Total Bilirubin: 1.8 mg/dL — ABNORMAL HIGH (ref 0.0–1.2)
Total Protein: 7.2 g/dL (ref 6.5–8.1)

## 2023-05-27 LAB — CBC WITH DIFFERENTIAL/PLATELET
Abs Immature Granulocytes: 0.11 10*3/uL — ABNORMAL HIGH (ref 0.00–0.07)
Basophils Absolute: 0 10*3/uL (ref 0.0–0.1)
Basophils Relative: 0 %
Eosinophils Absolute: 0 10*3/uL (ref 0.0–0.5)
Eosinophils Relative: 0 %
HCT: 47.8 % (ref 39.0–52.0)
Hemoglobin: 16.4 g/dL (ref 13.0–17.0)
Immature Granulocytes: 1 %
Lymphocytes Relative: 2 %
Lymphs Abs: 0.5 10*3/uL — ABNORMAL LOW (ref 0.7–4.0)
MCH: 30.7 pg (ref 26.0–34.0)
MCHC: 34.3 g/dL (ref 30.0–36.0)
MCV: 89.5 fL (ref 80.0–100.0)
Monocytes Absolute: 1.1 10*3/uL — ABNORMAL HIGH (ref 0.1–1.0)
Monocytes Relative: 6 %
Neutro Abs: 17.5 10*3/uL — ABNORMAL HIGH (ref 1.7–7.7)
Neutrophils Relative %: 91 %
Platelets: 260 10*3/uL (ref 150–400)
RBC: 5.34 MIL/uL (ref 4.22–5.81)
RDW: 12.6 % (ref 11.5–15.5)
WBC: 19.2 10*3/uL — ABNORMAL HIGH (ref 4.0–10.5)
nRBC: 0 % (ref 0.0–0.2)

## 2023-05-27 LAB — TROPONIN I (HIGH SENSITIVITY)
Troponin I (High Sensitivity): 82 ng/L — ABNORMAL HIGH (ref <18)
Troponin I (High Sensitivity): 91 ng/L — ABNORMAL HIGH (ref <18)
Troponin I (High Sensitivity): 99 ng/L — ABNORMAL HIGH (ref <18)

## 2023-05-27 LAB — ECHOCARDIOGRAM COMPLETE
AR max vel: 3.47 cm2
AV Area VTI: 3.73 cm2
AV Area mean vel: 3.41 cm2
AV Mean grad: 7 mmHg
AV Peak grad: 12.1 mmHg
Ao pk vel: 1.74 m/s
Area-P 1/2: 2.87 cm2
Height: 70 in
S' Lateral: 3.3 cm
Weight: 4321.02 [oz_av]

## 2023-05-27 LAB — PROTIME-INR
INR: 1.3 — ABNORMAL HIGH (ref 0.8–1.2)
Prothrombin Time: 16.4 s — ABNORMAL HIGH (ref 11.4–15.2)

## 2023-05-27 LAB — CBC
HCT: 42.4 % (ref 39.0–52.0)
Hemoglobin: 14.8 g/dL (ref 13.0–17.0)
MCH: 31 pg (ref 26.0–34.0)
MCHC: 34.9 g/dL (ref 30.0–36.0)
MCV: 88.9 fL (ref 80.0–100.0)
Platelets: 225 10*3/uL (ref 150–400)
RBC: 4.77 MIL/uL (ref 4.22–5.81)
RDW: 12.6 % (ref 11.5–15.5)
WBC: 15.3 10*3/uL — ABNORMAL HIGH (ref 4.0–10.5)
nRBC: 0 % (ref 0.0–0.2)

## 2023-05-27 LAB — I-STAT CG4 LACTIC ACID, ED
Lactic Acid, Venous: 1.7 mmol/L (ref 0.5–1.9)
Lactic Acid, Venous: 1.9 mmol/L (ref 0.5–1.9)

## 2023-05-27 LAB — CREATININE, SERUM
Creatinine, Ser: 1.05 mg/dL (ref 0.61–1.24)
GFR, Estimated: >60 mL/min (ref >60)

## 2023-05-27 LAB — RESP PANEL BY RT-PCR (RSV, FLU A&B, COVID)  RVPGX2
Influenza A by PCR: NEGATIVE
Influenza B by PCR: NEGATIVE
Resp Syncytial Virus by PCR: NEGATIVE
SARS Coronavirus 2 by RT PCR: NEGATIVE

## 2023-05-27 LAB — D-DIMER, QUANTITATIVE: D-Dimer, Quant: 0.47 ug{FEU}/mL (ref 0.00–0.50)

## 2023-05-27 MED ORDER — ACETAMINOPHEN 325 MG PO TABS
650.0000 mg | ORAL_TABLET | Freq: Four times a day (QID) | ORAL | Status: DC | PRN
Start: 2023-05-27 — End: 2023-05-31
  Administered 2023-05-27 – 2023-05-31 (×6): 650 mg via ORAL
  Filled 2023-05-27 (×6): qty 2

## 2023-05-27 MED ORDER — MELATONIN 5 MG PO TABS
5.0000 mg | ORAL_TABLET | Freq: Every evening | ORAL | Status: DC | PRN
  Administered 2023-05-28: 5 mg via ORAL
  Filled 2023-05-27: qty 1

## 2023-05-27 MED ORDER — POLYETHYLENE GLYCOL 3350 17 G PO PACK: 17.0000 g | PACK | Freq: Every day | ORAL | Status: DC | PRN

## 2023-05-27 MED ORDER — OXYCODONE HCL 5 MG PO TABS
5.0000 mg | ORAL_TABLET | Freq: Four times a day (QID) | ORAL | Status: DC | PRN
  Administered 2023-05-29 – 2023-05-30 (×2): 5 mg via ORAL
  Filled 2023-05-27 (×2): qty 1

## 2023-05-27 MED ORDER — PROCHLORPERAZINE EDISYLATE 10 MG/2ML IJ SOLN: 5.0000 mg | Freq: Four times a day (QID) | INTRAMUSCULAR | Status: DC | PRN

## 2023-05-27 MED ORDER — IOHEXOL 350 MG/ML SOLN
75.0000 mL | Freq: Once | INTRAVENOUS | Status: AC | PRN
  Administered 2023-05-27: 75 mL via INTRAVENOUS

## 2023-05-27 MED ORDER — ACETAMINOPHEN 325 MG PO TABS: 650.0000 mg | ORAL_TABLET | Freq: Four times a day (QID) | ORAL | Status: DC | PRN

## 2023-05-27 MED ORDER — SODIUM CHLORIDE 0.9 % IV SOLN
1.0000 g | INTRAVENOUS | Status: DC
  Administered 2023-05-27 – 2023-05-28 (×2): 1 g via INTRAVENOUS
  Filled 2023-05-27 (×2): qty 10

## 2023-05-27 MED ORDER — HYDROXYZINE HCL 25 MG PO TABS
25.0000 mg | ORAL_TABLET | Freq: Every day | ORAL | Status: DC
  Administered 2023-05-27 – 2023-05-30 (×4): 25 mg via ORAL
  Filled 2023-05-27 (×4): qty 1

## 2023-05-27 MED ORDER — ENOXAPARIN SODIUM 40 MG/0.4ML IJ SOSY
40.0000 mg | PREFILLED_SYRINGE | Freq: Every day | INTRAMUSCULAR | Status: DC
  Administered 2023-05-27 – 2023-05-31 (×5): 40 mg via SUBCUTANEOUS
  Filled 2023-05-27 (×5): qty 0.4

## 2023-05-27 MED ORDER — SODIUM CHLORIDE 0.9 % IV SOLN
1.0000 g | Freq: Once | INTRAVENOUS | Status: AC
  Administered 2023-05-27: 1 g via INTRAVENOUS
  Filled 2023-05-27: qty 10

## 2023-05-27 MED ORDER — MEMANTINE HCL 10 MG PO TABS
10.0000 mg | ORAL_TABLET | Freq: Two times a day (BID) | ORAL | Status: DC
  Administered 2023-05-27 – 2023-05-31 (×9): 10 mg via ORAL
  Filled 2023-05-27 (×9): qty 1

## 2023-05-27 MED ORDER — ROSUVASTATIN CALCIUM 5 MG PO TABS
5.0000 mg | ORAL_TABLET | Freq: Every day | ORAL | Status: DC
Start: 2023-05-27 — End: 2023-05-31
  Administered 2023-05-27 – 2023-05-31 (×5): 5 mg via ORAL
  Filled 2023-05-27 (×5): qty 1

## 2023-05-27 MED ORDER — LEVOTHYROXINE SODIUM 75 MCG PO TABS
175.0000 ug | ORAL_TABLET | Freq: Every day | ORAL | Status: DC
  Administered 2023-05-27 – 2023-05-31 (×5): 175 ug via ORAL
  Filled 2023-05-27 (×5): qty 1

## 2023-05-27 MED ORDER — ACETAMINOPHEN 325 MG PO TABS
650.0000 mg | ORAL_TABLET | Freq: Once | ORAL | Status: AC
  Administered 2023-05-27: 650 mg via ORAL
  Filled 2023-05-27: qty 2

## 2023-05-27 MED ORDER — LACTATED RINGERS IV SOLN: INTRAVENOUS | Status: AC

## 2023-05-27 NOTE — Progress Notes (Signed)
 Echocardiogram 2D Echocardiogram has been performed.  Mckyle Solanki N Spiro Ausborn,RDCS 05/27/2023, 4:56 PM

## 2023-05-27 NOTE — Progress Notes (Signed)
 VASCULAR LAB    Right lower extremity venous duplex has been performed.  See CV proc for preliminary results.   Naziyah Tieszen, RVT 05/27/2023, 4:59 PM

## 2023-05-27 NOTE — Plan of Care (Signed)

## 2023-05-27 NOTE — ED Notes (Signed)
 Update provided to daughter, [name], per pt's request.

## 2023-05-27 NOTE — ED Notes (Signed)
 CCMD notified for continuous cardiac monitoring.

## 2023-05-27 NOTE — ED Notes (Signed)
 Admitting provider at bedside.

## 2023-05-27 NOTE — H&P (Addendum)
 History and Physical  Jonathan Powers ZOX:[id number] DOB: [birth year] DOA: 05/26/2023  Referring physician: Dr. Jearld Fenton, EDP  PCP: [name], MD  Outpatient Specialists: Orthopedic surgery, neurology. Patient coming from: Home.  Chief Complaint: Right leg pain swelling and redness  HPI: Jonathan Powers is a 73 y.o. male with medical history significant for morbid obesity, hypertension, hyperlipidemia, hypothyroidism, presents to the ER due to right lower extremity edema, erythema, pain and warmth which he noticed 2 days ago.  Denies any trauma or insect bites.  No subjective fevers or chills.  In the ER, tachypneic with respiratory rate 27.  Febrile with Tmax 102.7.  Lab studies notable for leukocytosis 19.2 with left shift.  Code sepsis was called in the ER.  Peripheral blood cultures x 2 were obtained and the patient was started on empiric IV antibiotics for cellulitis.  TRH, hospitalist service consulted to admit.  At the time of this visit, the patient has unilateral right lower extremity edema with tenderness on palpation and warmth.  IV antibiotics and pain control in place.  The patient also endorses left upper abdominal pain which has been present for 3 to 4 months.  Associated with pencil shaped stools.  Last colonoscopy was 03/09/2022 which showed polyps (that were removed) and left colon diverticula without bleeding.  ED Course: Tmax 102.7.  BP 103/65, pulse 72 per, respiratory rate 18, O2 saturation 99% on room air.  Lab studies notable for serum glucose 152, T. bili 1.8.  C90.2.  Neutrophil count 17.5.  Review of Systems: Review of systems as noted in the HPI. All other systems reviewed and are negative.   Past Medical History:  Diagnosis Date   Acute recurrent pansinusitis 02/17/2021   Age related osteoporosis 03/11/2018   Allergic rhinitis 05/17/2006   BPH (benign prostatic hyperplasia) 09/06/2012   Cellulitis of left lower extremity 11/10/2018   Cellulitis, right leg     Recurrent R hip cellulitis 1/08,3/08    Chronic fatigue, unspecified    Chronic hyperglycemia 06/29/2020   Chronic right-sided low back pain without sciatica 07/21/2016   Chronic sinusitis 07/10/2021   Deviated septum 02/17/2021   Diverticulitis    Essential hypertension 05/17/2006   2014     Impression  Exercise Capacity:  Lexiscan with low level exercise.  BP Response:  Normal blood pressure response.  Clinical Symptoms:  No significant symptoms noted.  ECG Impression:  No significant ST segment change suggestive of ischemia.  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   Flexural eczema 07/24/2017   Generalized abdominal tenderness with rebound tenderness 08/02/2022   GERD (gastroesophageal reflux disease)    Gout    Hashimoto's thyroiditis    History of skin cancer    Melanoma on back   Hypogonadism male    low T, a/w ED   Hypothyroidism 11/11/2018   Idiopathic urticaria    Insomnia 11/10/2018   Left flank pain 08/02/2022   Left thyroid nodule 2008   on [place], decrease size in 2011 [place]   Lung nodule 07/25/2021   Migraines    Atypical and ocular   Mild neurocognitive disorder due to Alzheimer's disease 09/20/2022   Nasal turbinate hypertrophy 02/17/2021   Obesity (BMI 30-39.9) 05/17/2006   OSA (obstructive sleep apnea) 01/27/2013   HST 01/2013:  AHI 68/hr  with obstructive and central events.   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 No acute findings.  2. Stable 1.4 cm fusiform  dilatation of celiac trunk.  3. Ectatic abdominal aorta at risk for aneurysm development.  Recommend followup by ultrasound in 5 years. This recommendation  follows ACR consensus guidelines: White Paper of the ACR Incidental  Findings Committee II on Vascular Findings. J Am Coll Radiol 2013;  [id number].  4. 1.   Symptomatic PVCs 09/06/2012   Past Surgical History:  Procedure Laterality Date   CARPAL TUNNEL RELEASE Left 03/30/2017   Procedure: CARPAL TUNNEL RELEASE;  Surgeon: Valeria Batman, MD;  Location: [facility];  Service: Orthopedics;  Laterality: Left;   CHOLECYSTECTOMY  2004   CTR Bilateral 1982   CYSTOSCOPY  1974   FOOT FUSION Left 06/22/2017   HEMORRHOID SURGERY N/A 03/30/2017   Procedure: HEMORRHOIDECTOMY;  Surgeon: Abigail Miyamoto, MD;  Location: [facility];  Service: General;  Laterality: N/A;   open reduction L little finger Left 1970   Repair digital thumb, left Left 1990   Right shoulder cuff repair Right 09/15/11   Right shoulder SAD, DCR Right 02/05/11   TOTAL HIP ARTHROPLASTY Left 07/26/06   TOTAL HIP ARTHROPLASTY Right 1999   TOTAL HIP REVISION Left 03/07/2019   Procedure: LEFT TOTAL HIP REVISION POSTERIOR APPROACH;  Surgeon: Gean Birchwood, MD;  Location: [facility];  Service: Orthopedics;  Laterality: Left;    Social History:  reports that he has never smoked. He has never used smokeless tobacco. He reports that he does not currently use alcohol. He reports that he does not use drugs.   Allergies  Allergen Reactions   Nsaids Shortness Of Breath    Naproxen specifically causes SOB/wheeze tolerates topical diclofenac without problem Tylenol OK   Tolmetin Shortness Of Breath    Naproxen specifically causes SOB/wheeze tolerates topical diclofenac without problem Tylenol OK    Family History  Problem Relation Age of Onset   Arthritis Father    Heart disease Father    Diabetes Father    Vascular Disease Father        AoBifem    Arthritis Mother    Diabetes Mother    Multiple sclerosis Mother    Cancer Paternal Grandmother        "GI"   Cancer Paternal Grandfather        stomach cancer   Thyroid cancer Sister    Uterine cancer Sister       Prior to Admission medications   Medication Sig Start Date End Date Taking? Authorizing Provider  cephALEXin (KEFLEX) 500 MG capsule Take 1 capsule (500 mg total) by mouth 3 (three) times daily. 05/16/23   Adonis Huguenin, NP  cetirizine (ZYRTEC) 10 MG tablet Take 10 mg by mouth daily as needed for allergies.    [provider]  dicyclomine (BENTYL) 10 MG capsule Take 1 capsule (10 mg total) by mouth 3 (three) times daily before meals. Patient not taking: Reported on 01/16/2023 01/09/23   Meryl Dare, MD  HYDROcodone-acetaminophen (NORCO/VICODIN) 5-325 MG tablet Take 1-2 tablets by mouth every 6 (six) hours as needed. 05/10/23  Benjiman Core, MD  hydrOXYzine (ATARAX) 25 MG tablet Take 25 mg by mouth at bedtime. 02/26/23   [provider]  levothyroxine (SYNTHROID) 175 MCG tablet TAKE 1 TABLET BY MOUTH ONCE DAILY BEFORE BREAKFAST 02/28/23   [name], MD  memantine (NAMENDA) 10 MG tablet Take 1 tablet (10 mg total) by mouth 2 (two) times daily. 04/25/23   Marcos Eke, PA-C  Olmesartan Medoxomil (BENICAR PO) Take by mouth.    [provider]  rosuvastatin (CRESTOR) 5 MG tablet Take 1 tablet (5 mg total) by mouth daily. 01/22/23   [name], MD  terbinafine (LAMISIL) 250 MG tablet Take 1 tablet (250 mg total) by mouth daily. 05/16/23   Adonis Huguenin, NP  Testosterone 1.62 % GEL Apply 40.5 mg topically in the morning. 11/26/20   [provider]  traZODone (DESYREL) 100 MG tablet TAKE 1 TABLET BY MOUTH AT BEDTIME AS NEEDED FOR SLEEP 03/04/23   [name], MD  apixaban (ELIQUIS) 2.5 MG TABS tablet Take 1 tablet (2.5 mg total) by mouth 2 (two) times daily. 03/07/19 05/16/19  Allena Katz, PA-C    Physical Exam: BP 103/65 (BP  Location: Right Arm)   Pulse 72   Temp 99 F (37.2 C) (Oral)   Resp 18   Ht 5\' 10"  (1.778 m)   Wt 122.5 kg   SpO2 99%   BMI 38.75 kg/m   General: 73 y.o. year-old male well developed well nourished in no acute distress.  Alert and oriented x3. Cardiovascular: Regular rate and rhythm with no rubs or gallops.  No thyromegaly or JVD noted.   Respiratory: Clear to auscultation with no wheezes or rales. Good inspiratory effort. Abdomen: Soft left upper quadrant abdominal tenderness with moderate palpation nondistended with normal bowel sounds x4 quadrants. Muskuloskeletal: No cyanosis or clubbing noted bilaterally Neuro: CN II-XII intact, strength, sensation, reflexes Skin: No ulcerative lesions noted.  Erythema and warmth involving right lower extremity. Psychiatry: Judgement and insight appear normal. Mood is appropriate for condition and setting          Labs on Admission:  Basic Metabolic Panel: Recent Labs  Lab 05/27/23 0005  NA 135  K 3.9  CL 101  CO2 22  GLUCOSE 152*  BUN 16  CREATININE 1.17  CALCIUM 9.2   Liver Function Tests: Recent Labs  Lab 05/27/23 0005  AST 25  ALT 21  ALKPHOS 44  BILITOT 1.8*  PROT 7.2  ALBUMIN 3.8   No results for input(s): "LIPASE", "AMYLASE" in the last 168 hours. No results for input(s): "AMMONIA" in the last 168 hours. CBC: Recent Labs  Lab 05/27/23 0005  WBC 19.2*  NEUTROABS 17.5*  HGB 16.4  HCT 47.8  MCV 89.5  PLT 260   Cardiac Enzymes: No results for input(s): "CKTOTAL", "CKMB", "CKMBINDEX", "TROPONINI" in the last 168 hours.  BNP (last 3 results) Recent Labs    06/24/22 1551  BNP 61.8    ProBNP (last 3 results) No results for input(s): "PROBNP" in the last 8760 hours.  CBG: No results for input(s): "GLUCAP" in the last 168 hours.  Radiological Exams on Admission: CT Angio Chest PE W and/or Wo Contrast Result Date: 05/27/2023 CLINICAL DATA:  Shortness of breath, sepsis, and left lower quadrant pain. EXAM:  CT ANGIOGRAPHY CHEST CT ABDOMEN AND PELVIS WITH CONTRAST TECHNIQUE: Multidetector CT imaging of the chest was performed using the standard protocol during bolus administration of intravenous contrast. Multiplanar CT image reconstructions and MIPs were  obtained to evaluate the vascular anatomy. Multidetector CT imaging of the abdomen and pelvis was performed using the standard protocol during bolus administration of intravenous contrast. RADIATION DOSE REDUCTION: This exam was performed according to the departmental dose-optimization program which includes automated exposure control, adjustment of the mA and/or kV according to patient size and/or use of iterative reconstruction technique. CONTRAST:  75mL OMNIPAQUE IOHEXOL 350 MG/ML SOLN COMPARISON:  PA lateral chest today, PA chest with rib series 05/10/2023, CT chest, abdomen and pelvis with IV contrast 05/10/2023, and CTA chest 06/24/2022. FINDINGS: CTA CHEST FINDINGS Cardiovascular: The pulmonary arteries are normal caliber and do not show evidence of embolic filling defects. The cardiac size is normal. There are scattered three-vessel calcifications in the coronary arteries. There is no pericardial effusion, no venous dilatation. There is mild aortic tortuosity and atherosclerosis without aneurysm, dissection or stenosis. The great vessels are clear. Mediastinum/Nodes: No enlarged mediastinal, hilar, or axillary lymph nodes. Thyroid gland, trachea, and esophagus demonstrate no significant findings. Lungs/Pleura: Small area with chronic branching micronodular disease lateral left upper lobe, findings consistent with small airways impactions. There is increased linear scarring or atelectasis in the right lower lobe interval decreased atelectasis in the right middle lobe. Small airway impactions also chronically noted in the posterior right upper lobe left apex. There is no consolidation, effusion or pneumothorax.  Next Musculoskeletal: Multilevel thoracic spine  bridging enthesopathy, degenerative discs, spondylosis, and thoracic kyphosis. No acute or other significant osseous findings. There are healed fracture deformities of some of the anterolateral left ribs. Bilateral discoid gynecomastia appears similar. There is eventration and mild chronic elevation of the anterior right diaphragm. No chest wall mass is seen. Review of the MIP images confirms the above findings. CT ABDOMEN and PELVIS FINDINGS Hepatobiliary: The liver is 18 cm in length mildly steatotic. Status post cholecystectomy with no biliary dilatation. Fall Pancreas: No abnormality. Spleen: No abnormality. Adrenals/Urinary Tract: There is no adrenal or renal mass enhancement. There are small Bosniak 1 cysts in the inferior pole of the kidneys but no mass enhancement of the kidneys. There is no urinary stone or obstruction. There are few renovascular calcifications at the left renal hilum. The lower pelvic ureters and the bladder are all obscured due to extensive metallic artifact from bilateral hip replacements. Stomach/Bowel: Unremarkable contracted stomach. Normal caliber small bowel without wall thickening or inflammatory change. Normal subcecal appendix. There is colonic diverticulosis without evidence of wall thickening or diverticulitis. Vascular/Lymphatic: Aortic atherosclerosis. No enlarged abdominal or pelvic lymph nodes. Reproductive: Prostate obscured by the hip replacements. Other: Small inguinal fat hernias. Small umbilical fat hernia. No incarcerated hernia. No free fluid, free air or focal inflammatory process. Pelvic phleboliths. Musculoskeletal: Chronic bilateral hip replacements. Advanced degenerative change lumbar spine. Ankylosis superior left SI joint. Acquired spinal stenosis L4-5. Review of the MIP images confirms the above findings. IMPRESSION: 1. No evidence of arterial dilatation, embolus or other acute chest CT or CTA findings. 2. Aortic and coronary artery atherosclerosis. 3.  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 4. No acute CT findings in the abdomen or pelvis. 5. Diverticulosis without evidence of diverticulitis. 6. Small umbilical and inguinal fat hernias. 7. Mild hepatic steatosis. 8. Chronic bilateral hip replacements with extensive metallic artifact obscuring the lower pelvic ureters, prostate and bladder. 9. Acquired spinal stenosis L4-5. 10. Discoid gynecomastia. Electronically Signed   By: Almira Bar M.D.   On: 05/27/2023 02:29   CT ABDOMEN PELVIS W CONTRAST Result Date: 05/27/2023 CLINICAL DATA:  Shortness of breath,  sepsis, and left lower quadrant pain. EXAM: CT ANGIOGRAPHY CHEST CT ABDOMEN AND PELVIS WITH CONTRAST TECHNIQUE: Multidetector CT imaging of the chest was performed using the standard protocol during bolus administration of intravenous contrast. Multiplanar CT image reconstructions and MIPs were obtained to evaluate the vascular anatomy. Multidetector CT imaging of the abdomen and pelvis was performed using the standard protocol during bolus administration of intravenous contrast. RADIATION DOSE REDUCTION: This exam was performed according to the departmental dose-optimization program which includes automated exposure control, adjustment of the mA and/or kV according to patient size and/or use of iterative reconstruction technique. CONTRAST:  75mL OMNIPAQUE IOHEXOL 350 MG/ML SOLN COMPARISON:  PA lateral chest today, PA chest with rib series 05/10/2023, CT chest, abdomen and pelvis with IV contrast 05/10/2023, and CTA chest 06/24/2022. FINDINGS: CTA CHEST FINDINGS Cardiovascular: The pulmonary arteries are normal caliber and do not show evidence of embolic filling defects. The cardiac size is normal. There are scattered three-vessel calcifications in the coronary arteries. There is no pericardial effusion, no venous dilatation. There is mild aortic tortuosity and atherosclerosis without aneurysm, dissection or stenosis. The great vessels are  clear. Mediastinum/Nodes: No enlarged mediastinal, hilar, or axillary lymph nodes. Thyroid gland, trachea, and esophagus demonstrate no significant findings. Lungs/Pleura: Small area with chronic branching micronodular disease lateral left upper lobe, findings consistent with small airways impactions. There is increased linear scarring or atelectasis in the right lower lobe interval decreased atelectasis in the right middle lobe. Small airway impactions also chronically noted in the posterior right upper lobe left apex. There is no consolidation, effusion or pneumothorax.  Next Musculoskeletal: Multilevel thoracic spine bridging enthesopathy, degenerative discs, spondylosis, and thoracic kyphosis. No acute or other significant osseous findings. There are healed fracture deformities of some of the anterolateral left ribs. Bilateral discoid gynecomastia appears similar. There is eventration and mild chronic elevation of the anterior right diaphragm. No chest wall mass is seen. Review of the MIP images confirms the above findings. CT ABDOMEN and PELVIS FINDINGS Hepatobiliary: The liver is 18 cm in length mildly steatotic. Status post cholecystectomy with no biliary dilatation. Fall Pancreas: No abnormality. Spleen: No abnormality. Adrenals/Urinary Tract: There is no adrenal or renal mass enhancement. There are small Bosniak 1 cysts in the inferior pole of the kidneys but no mass enhancement of the kidneys. There is no urinary stone or obstruction. There are few renovascular calcifications at the left renal hilum. The lower pelvic ureters and the bladder are all obscured due to extensive metallic artifact from bilateral hip replacements. Stomach/Bowel: Unremarkable contracted stomach. Normal caliber small bowel without wall thickening or inflammatory change. Normal subcecal appendix. There is colonic diverticulosis without evidence of wall thickening or diverticulitis. Vascular/Lymphatic: Aortic atherosclerosis. No  enlarged abdominal or pelvic lymph nodes. Reproductive: Prostate obscured by the hip replacements. Other: Small inguinal fat hernias. Small umbilical fat hernia. No incarcerated hernia. No free fluid, free air or focal inflammatory process. Pelvic phleboliths. Musculoskeletal: Chronic bilateral hip replacements. Advanced degenerative change lumbar spine. Ankylosis superior left SI joint. Acquired spinal stenosis L4-5. Review of the MIP images confirms the above findings. IMPRESSION: 1. No evidence of arterial dilatation, embolus or other acute chest CT or CTA findings. 2. Aortic and coronary artery atherosclerosis. 3. 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 4. No acute CT findings in the abdomen or pelvis. 5. Diverticulosis without evidence of diverticulitis. 6. Small umbilical and inguinal fat hernias. 7. Mild hepatic steatosis. 8. Chronic bilateral hip replacements with extensive metallic artifact obscuring the lower pelvic  ureters, prostate and bladder. 9. Acquired spinal stenosis L4-5. 10. Discoid gynecomastia. Electronically Signed   By: Almira Bar M.D.   On: 05/27/2023 02:29   DG Chest 2 View Result Date: 05/27/2023 CLINICAL DATA:  Shortness of breath, dry cough, chest pain. EXAM: CHEST - 2 VIEW COMPARISON:  Rib series and PA chest 05/10/2023 FINDINGS: The heart size and mediastinal contours are within normal limits. There is a low inspiration, with exaggerated AP chest diameter. The lungs are clear of infiltrates. The visualized skeletal structures are intact with osteopenia and thoracic kyphosis and spondylosis. IMPRESSION: Low inspiration. No evidence of acute chest disease or interval changes. Electronically Signed   By: Almira Bar M.D.   On: 05/27/2023 00:23    EKG: I independently viewed the EKG done and my findings are as followed: Sinus rhythm rate of 80.  Nonspecific ST-T changes with QTc 431.  Assessment/Plan Present on Admission:  Cellulitis  Active Problems:    Cellulitis  Sepsis secondary to right lower extremity cellulitis Presented with Tmax 102.7, leukocytosis 19K, tachypnea 27, and right lower extremity cellulitis. Continue Rocephin initiated in the ER Follow peripheral blood cultures x 2 As needed analgesics Monitor fever curve and WBCs  Elevated troponin, suspect demand ischemia in the setting of sepsis Troponin 99, from 91, trend No evidence of acute ischemia on 12 EKG No evidence of acute pulmonary embolism on CT angio No reported anginal symptoms Monitor on telemetry Follow Limited 2D echo to rule out other causes  Unilateral right lower extremity edema Follow-up duplex ultrasound to rule out DVT. Elevate lower extremities  Left upper quadrant abdominal pain, chronic Denies constipation, melena or hematochezia Denies significant unintentional weight loss Consider GI outpatient follow-up As needed analgesics  Isolated elevated T bilirubin T. bili 1.8, nonspecific Monitor for now  Obesity BMI 38 Recommend weight loss outpatient with regular physical activity and healthy dieting.  Hypothyroidism Resume home levothyroxine  Hyperglycemia Serum glucose 152 No history of diabetes Monitor for now    Critical care time: 55 minutes.    DVT prophylaxis: Subcu Lovenox daily.  Code Status: Full code.  Family Communication: None at bedside.  Disposition Plan: Admitted to telemetry medical unit  Consults called: None.  Admission status: Inpatient status.   Status is: Inpatient The patient requires at least 2 midnights for further evaluation and treatment of present condition.   Darlin Drop MD [facility] Pager [phone]  If 7PM-7AM, please contact night-coverage [email or web address] Password [email or web address]  05/27/2023, 6:04 AM

## 2023-05-27 NOTE — ED Provider Notes (Signed)
 [place] EMERGENCY DEPARTMENT AT [facility] Provider Note   CSN: [id number] Arrival date & time: 05/26/23  2254     History  Chief Complaint  Patient presents with   Shortness of Breath    Jonathan Powers is a 73 y.o. male with PMH as listed below who presents with RLE pain, erythema, swelling that started 2 days ago but possibly has been present for multiple weeks and worsening over last 2 days. Denies trauma to leg. No h/o DVT/PE. No recent travel/hospitalizations/surgeries. Does endorse some mild intermittent chest pain, central, not pleuritic, a/w dry cough. Denies fevers/chills. On arrival to ED noted to be febrile with T 102.12F, and tachypneic to RR 27 bpm.    Past Medical History:  Diagnosis Date   Acute recurrent pansinusitis 02/17/2021   Age related osteoporosis 03/11/2018   Allergic rhinitis 05/17/2006   BPH (benign prostatic hyperplasia) 09/06/2012   Cellulitis of left lower extremity 11/10/2018   Cellulitis, right leg    Recurrent R hip cellulitis 1/08,3/08    Chronic fatigue, unspecified    Chronic hyperglycemia 06/29/2020   Chronic right-sided low back pain without sciatica 07/21/2016   Chronic sinusitis 07/10/2021   Deviated septum 02/17/2021   Diverticulitis    Essential hypertension 05/17/2006   2014     Impression  Exercise Capacity:  Lexiscan with low level exercise.  BP Response:  Normal blood pressure response.  Clinical Symptoms:  No significant symptoms noted.  ECG Impression:  No significant ST segment change suggestive of ischemia.  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   Flexural eczema 07/24/2017   Generalized abdominal tenderness with rebound tenderness 08/02/2022   GERD (gastroesophageal reflux disease)    Gout    Hashimoto's  thyroiditis    History of skin cancer    Melanoma on back   Hypogonadism male    low T, a/w ED   Hypothyroidism 11/11/2018   Idiopathic urticaria    Insomnia 11/10/2018   Left flank pain 08/02/2022   Left thyroid nodule 2008   on US , decrease size in 2011 US    Lung nodule 07/25/2021   Migraines    Atypical and ocular   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 No acute findings.  2. 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  3. Ectatic abdominal aorta at risk for aneurysm development.  Recommend followup by ultrasound in 5 years. This recommendation  follows ACR consensus guidelines: White Paper of the ACR Incidental  Findings Committee II on Vascular Findings. J Am Coll Radiol 2013;  10:789-794.  4. 1.   Symptomatic PVCs 09/06/2012       Home Medications Prior to Admission medications   Medication Sig Start Date End Date  Taking? Authorizing Provider  cetirizine (ZYRTEC) 10 MG tablet Take 10 mg by mouth daily as needed for allergies.   Yes [provider]  memantine (NAMENDA) 10 MG tablet Take 1 tablet (10 mg total) by mouth 2 (two) times daily. 04/25/23  Yes Wertman, Sara E, PA-C  rosuvastatin (CRESTOR) 5 MG tablet Take 1 tablet (5 mg total) by mouth daily. 01/22/23  Yes Arcadio Knuckles, MD  Testosterone 1.62 % GEL Apply 2 Pump topically in the morning. 11/26/20  Yes [provider]  acetaminophen (TYLENOL) 325 MG  tablet Take 2 tablets (650 mg total) by mouth every 6 (six) hours as needed for mild pain (pain score 1-3), fever, headache or moderate pain (pain score 4-6). 05/31/23   Abbe Abate, MD  cephALEXin (KEFLEX) 500 MG capsule Take 1 capsule (500 mg total) by mouth 4 (four) times daily for 10 days. 05/31/23 06/10/23  Abbe Abate, MD  dicyclomine (BENTYL) 10 MG capsule Take 10 mg by mouth 3 (three) times daily. 05/27/23   [provider]  levothyroxine (SYNTHROID) 175 MCG tablet TAKE 1 TABLET BY MOUTH ONCE DAILY BEFORE BREAKFAST 05/28/23   Arcadio Knuckles, MD  olmesartan-hydrochlorothiazide (BENICAR HCT) 40-25 MG tablet Take 1 tablet by mouth daily.    [provider]  terbinafine (LAMISIL) 250 MG tablet Take 1 tablet (250 mg total) by mouth daily for 10 days. 06/01/23 06/11/23  Abbe Abate, MD  traZODone (DESYREL) 100 MG tablet TAKE 1 TABLET BY MOUTH AT BEDTIME AS NEEDED FOR SLEEP 06/01/23   Arcadio Knuckles, MD  apixaban (ELIQUIS) 2.5 MG TABS tablet Take 1 tablet (2.5 mg total) by mouth 2 (two) times daily. 03/07/19 05/16/19  Sheron Dixons, PA-C      Allergies    Nsaids and Tolmetin    Review of Systems   Review of Systems A 10 point review of systems was performed and is negative unless otherwise reported in HPI.  Physical Exam Updated Vital Signs BP 130/70 (BP Location: Left Arm)   Pulse 65   Temp 97.7 F (36.5 C) (Axillary)   Resp 17   Ht 5\' 10"  (1.778 m)   Wt 122.5 kg   SpO2 95%   BMI 38.75 kg/m  Physical Exam General: Normal appearing male, lying in bed.  HEENT: Sclera anicteric, MMM, trachea midline.  Cardiology: RRR, no murmurs/rubs/gallops.  Resp: Normal respiratory rate and effort. CTAB, no wheezes, rhonchi, crackles.  Abd: Soft, non-tender, non-distended. No rebound tenderness or guarding.  GU: Deferred. MSK: Diffuse erythema and mild to moderate edema with warmth noted in right lower extremity below the knee.  Not involving the joints.  No joint  effusions.  DP/PT pulses intact.  NVI.  Soft compartments.  No signs of trauma. Extremities without deformity. No cyanosis or clubbing. Skin: warm, dry. Back: No CVA tenderness Neuro: A&Ox4, CNs II-XII grossly intact. MAEs. Sensation grossly intact.  Psych: Normal mood and affect.   ED Results / Procedures / Treatments   Labs (all labs ordered are listed, but only abnormal results are displayed) Labs Reviewed  COMPREHENSIVE METABOLIC PANEL WITH GFR - Abnormal; Notable for the following components:      Result Value   Glucose, Bld 152 (*)    Total Bilirubin 1.8 (*)    All other components within normal limits  CBC WITH DIFFERENTIAL/PLATELET - Abnormal; Notable for the following components:   WBC 19.2 (*)    Neutro Abs 17.5 (*)    Lymphs Abs 0.5 (*)  Monocytes Absolute 1.1 (*)    Abs Immature Granulocytes 0.11 (*)    All other components within normal limits  PROTIME-INR - Abnormal; Notable for the following components:   Prothrombin Time 16.4 (*)    INR 1.3 (*)    All other components within normal limits  URINALYSIS, W/ REFLEX TO CULTURE (INFECTION SUSPECTED) - Abnormal; Notable for the following components:   Color, Urine AMBER (*)    APPearance HAZY (*)    Glucose, UA 50 (*)    Ketones, ur 5 (*)    All other components within normal limits  TROPONIN I (HIGH SENSITIVITY) - Abnormal; Notable for the following components:   Troponin I (High Sensitivity) 91 (*)    All other components within normal limits  TROPONIN I (HIGH SENSITIVITY) - Abnormal; Notable for the following components:   Troponin I (High Sensitivity) 99 (*)    All other components within normal limits  TROPONIN I (HIGH SENSITIVITY) - Abnormal; Notable for the following components:   Troponin I (High Sensitivity) 82 (*)    All other components within normal limits  CULTURE, BLOOD (ROUTINE X 2)  CULTURE, BLOOD (ROUTINE X 2)  RESP PANEL BY RT-PCR (RSV, FLU A&B, COVID)  RVPGX2  MRSA NEXT GEN BY PCR, NASAL   D-DIMER, QUANTITATIVE  CREATININE, SERUM  CBC  I-STAT CG4 LACTIC ACID, ED  I-STAT CG4 LACTIC ACID, ED    EKG EKG Interpretation Date/Time:  Saturday May 26 2023 23:57:56 EDT Ventricular Rate:  80 PR Interval:  166 QRS Duration:  80 QT Interval:  374 QTC Calculation: 431 R Axis:   50  Text Interpretation: Normal sinus rhythm Cannot rule out Anterior infarct , age undetermined Confirmed by Annita Kindle [phone]) on 05/27/2023 12:33:00 AM  Radiology DG Tibia/Fibula Right Result Date: 06/03/2023 CLINICAL DATA:  86310 Swelling 60454 EXAM: RIGHT TIBIA AND FIBULA - 2 VIEW COMPARISON:  X-ray right knee 12/15/2021 FINDINGS: No cortical erosion or destruction. There is no evidence of fracture or other focal bone lesions. At least moderate lateral tibiofemoral compartment joint degenerative changes. Plantar calcaneal spur. Subcutaneus soft tissue edema. IMPRESSION: 1. No radiographic findings to suggest osteomyelitis. 2. No acute displaced fracture or dislocation. Electronically Signed   By: Morgane  Naveau M.D.   On: 06/03/2023 12:57    Procedures .Critical Care  Performed by: Merdis Stalling, MD Authorized by: Merdis Stalling, MD   Critical care provider statement:    Critical care time (minutes):  30   Critical care was necessary to treat or prevent imminent or life-threatening deterioration of the following conditions:  Sepsis   Critical care was time spent personally by me on the following activities:  Development of treatment plan with patient or surrogate, discussions with consultants, evaluation of patient's response to treatment, examination of patient, ordering and review of laboratory studies, ordering and review of radiographic studies, ordering and performing treatments and interventions, pulse oximetry, re-evaluation of patient's condition, review of old charts and obtaining history from patient or surrogate   Care discussed with: admitting provider       Medications Ordered  in ED Medications  lactated ringers infusion (0 mLs Intravenous Stopped 05/27/23 2216)  acetaminophen (TYLENOL) tablet 650 mg (650 mg Oral Given 05/27/23 0018)  iohexol (OMNIPAQUE) 350 MG/ML injection 75 mL (75 mLs Intravenous Contrast Given 05/27/23 0204)  cefTRIAXone (ROCEPHIN) 1 g in sodium chloride 0.9 % 100 mL IVPB (0 g Intravenous Stopped 05/27/23 0981)    ED Course/ Medical Decision Making/ A&P  Medical Decision Making Amount and/or Complexity of Data Reviewed Labs: ordered. Decision-making details documented in ED Course. Radiology: ordered. Decision-making details documented in ED Course.  Risk Prescription drug management. Decision regarding hospitalization.    This patient presents to the ED for concern of right lower extremity pain and swelling with chest pain, this involves an extensive number of treatment options, and is a complaint that carries with it a high risk of complications and morbidity.  I considered the following differential and admission for this acute, potentially life threatening condition.   MDM:    Patient with tachypnea, fever, and leukocytosis noted.  Code sepsis called, obtained blood cultures, and given broad-spectrum antibiotics and fluids.  Chest x-ray does not demonstrate any pneumonia.  Due to leg swelling/redness with chest pain, D-dimer is reassuringly negative however obtained CTA as well which did not show PE.  High degree of concern then for right lower extremity cellulitis.  No area of fluctuance to indicate drainable abscess.  Also with chest pain obtain troponin to rule out ACS which was reassuringly flat though mildly elevated.  Respiratory panel also negative for viral illness.  Patient will be admitted to medicine.  Clinical Course as of 06/04/23 1811  Sun May 27, 2023  0032 DG Chest 2 View The heart size and mediastinal contours are within normal limits.  There is a low inspiration, with exaggerated AP chest diameter.  The lungs are clear of infiltrates.  The visualized skeletal structures are intact with osteopenia and thoracic kyphosis and spondylosis.  IMPRESSION: Low inspiration. No evidence of acute chest disease or interval changes.   [HN]  0033 WBC(!): 19.2 +leukocytosis w/ left shift [HN]  0033 Lactic Acid, Venous: 1.9 wnl [HN]  0033 Urinalysis, w/ Reflex to Culture (Infection Suspected) -Urine, Clean Catch(!) Mild ketonuria/glucosuria, no UTI [HN]  0242 D-Dimer, Quant: 0.47 neg [HN]  0242 CT Angio Chest PE W and/or Wo Contrast 1. No evidence of arterial dilatation, embolus or other acute chest CT or CTA findings. 2. Aortic and coronary artery atherosclerosis. 3. 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 4. No acute CT findings in the abdomen or pelvis. 5. Diverticulosis without evidence of diverticulitis. 6. Small umbilical and inguinal fat hernias. 7. Mild hepatic steatosis. 8. Chronic bilateral hip replacements with extensive metallic artifact obscuring the lower pelvic ureters, prostate and bladder. 9. Acquired spinal stenosis L4-5. 10. Discoid gynecomastia.   [HN]    Clinical Course User Index [HN] Merdis Stalling, MD    Labs: I Ordered, and personally interpreted labs.  The pertinent results include: Those listed above  Imaging Studies ordered: Chest x-ray ordered from triage.  I ordered imaging studies including CTA I independently visualized and interpreted imaging. I agree with the radiologist interpretation  Additional history obtained from chart review.  Cardiac Monitoring: The patient was maintained on a cardiac monitor.  I personally viewed and interpreted the cardiac monitored which showed an underlying rhythm of: Normal sinus rhythm  Reevaluation: After the interventions noted above, I reevaluated the patient and found that they have :improved  Social Determinants of Health:  lives independently  Disposition: Admit to medicine  Co  morbidities that complicate the patient evaluation  Past Medical History:  Diagnosis Date   Acute recurrent pansinusitis 02/17/2021   Age related osteoporosis 03/11/2018   Allergic rhinitis 05/17/2006   BPH (benign prostatic hyperplasia) 09/06/2012   Cellulitis of left lower extremity 11/10/2018   Cellulitis, right leg    Recurrent R hip cellulitis 1/08,3/08  Chronic fatigue, unspecified    Chronic hyperglycemia 06/29/2020   Chronic right-sided low back pain without sciatica 07/21/2016   Chronic sinusitis 07/10/2021   Deviated septum 02/17/2021   Diverticulitis    Essential hypertension 05/17/2006   2014     Impression  Exercise Capacity:  Lexiscan with low level exercise.  BP Response:  Normal blood pressure response.  Clinical Symptoms:  No significant symptoms noted.  ECG Impression:  No significant ST segment change suggestive of ischemia.  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   Flexural eczema 07/24/2017   Generalized abdominal tenderness with rebound tenderness 08/02/2022   GERD (gastroesophageal reflux disease)    Gout    Hashimoto's thyroiditis    History of skin cancer    Melanoma on back   Hypogonadism male    low T, a/w ED   Hypothyroidism 11/11/2018   Idiopathic urticaria    Insomnia 11/10/2018   Left flank pain 08/02/2022   Left thyroid nodule 2008   on US , decrease size in 2011 US    Lung nodule 07/25/2021   Migraines    Atypical and ocular   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   09/2015 compliance report> > 4 hours 80% of days, used 25/30 days    Primary osteoarthritis 05/17/2006   Rash 07/22/2022   Reactive thrombocytosis 09/17/2021   Recurrent genital HSV (herpes simplex virus) infection 11/12/2018   Rib fracture 07/22/2022   Superior mesenteric artery aneurysm 07/12/2015   DEC 2019     IMPRESSION:  VASCULAR     1. No acute findings.  2. 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  3. Ectatic abdominal aorta at risk for aneurysm development.  Recommend followup by ultrasound in 5 years. This recommendation  follows ACR consensus guidelines: White Paper of the ACR Incidental  Findings Committee II on Vascular Findings. J Am Coll Radiol 2013;  10:789-794.  4. 1.   Symptomatic PVCs 09/06/2012     Medicines Meds ordered this encounter  Medications   acetaminophen (TYLENOL) tablet 650 mg   iohexol (OMNIPAQUE) 350 MG/ML injection 75 mL   cefTRIAXone (ROCEPHIN) 1 g in sodium chloride 0.9 % 100 mL IVPB    Antibiotic Indication::   Cellulitis   DISCONTD: enoxaparin (LOVENOX) injection 40 mg   DISCONTD: acetaminophen (TYLENOL) tablet 650 mg   DISCONTD: prochlorperazine (COMPAZINE) injection 5 mg   DISCONTD: polyethylene glycol (MIRALAX / GLYCOLAX) packet 17 g   DISCONTD: melatonin tablet 5 mg   DISCONTD: oxyCODONE (Oxy IR/ROXICODONE) immediate release tablet 5 mg    Refill:  0   DISCONTD: cefTRIAXone (ROCEPHIN) 1 g in sodium chloride 0.9 % 100 mL IVPB    Antibiotic Indication::   Cellulitis   lactated ringers infusion   DISCONTD: rosuvastatin (CRESTOR) tablet 5 mg   DISCONTD: levothyroxine (SYNTHROID) tablet 175 mcg   DISCONTD: hydrOXYzine (ATARAX) tablet 25 mg   DISCONTD: acetaminophen (TYLENOL) tablet 650 mg   DISCONTD: memantine (NAMENDA) tablet 10 mg   DISCONTD: ceFAZolin (ANCEF) IVPB 2g/100 mL premix    Antibiotic Indication::   Cellulitis   DISCONTD: terbinafine (LAMISIL) tablet  250 mg   DISCONTD: cephALEXin (KEFLEX) capsule 500 mg   acetaminophen (TYLENOL) 325 MG tablet    Sig: Take 2 tablets (650 mg total) by mouth  every 6 (six) hours as needed for mild pain (pain score 1-3), fever, headache or moderate pain (pain score 4-6).   cephALEXin (KEFLEX) 500 MG capsule    Sig: Take 1 capsule (500 mg total) by mouth 4 (four) times daily for 10 days.    Dispense:  40 capsule    Refill:  0   terbinafine (LAMISIL) 250 MG tablet    Sig: Take 1 tablet (250 mg total) by mouth daily for 10 days.    Dispense:  10 tablet    Refill:  0    I have reviewed the patients home medicines and have made adjustments as needed  Problem List / ED Course: Problem List Items Addressed This Visit       Other   Cellulitis - Primary                This note was created using dictation software, which may contain spelling or grammatical errors.    Merdis Stalling, MD 06/04/23 [phone]

## 2023-05-27 NOTE — Progress Notes (Addendum)
 PROGRESS NOTE   Jonathan Powers  JXB:[id number]    DOB: 1951-01-08    DOA: 05/26/2023  PCP: [name], MD   I have briefly reviewed patients previous medical records in [facility].  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  Although patient denied any trauma, insect bite or his dog licking his right lower extremity, recently seen in orthopedic office on 3/26 for right foot pain and treated for tinea pedis of the webspaces.  Admitted for right lower extremity cellulitis.   Assessment & Plan:  Active Problems:   Cellulitis   Right leg cellulitis Likely source of entry was tenia pedis with wound noted in the third right foot webspace and may be a tiny 1 even in the fourth webspace. Although patient presented with fever of 102.7 F, transient tachypnea, and leukocytosis, there was no evidence of target organ dysfunction.  Lactate was normal x 2.  Sepsis ruled out. Continue empirically started IV ceftriaxone.  Blood cultures x 2: Drawn and pending Influenza/RSV/COVID-19 testing negative. CTA chest PE protocol, CT abdomen and pelvis with contrast: No PE and no acute abnormalities Agree with checking RLE venous Doppler but suspect will be a low yield (D-dimer negative) Elevate RLE.  Multimodality pain control. Leukocytosis improving, follow daily CBCs. Continue topical antifungal to webspaces.  Patient counseled regarding importance of keeping his feet/webspaces clean and dry and monitoring closely at home.  Elevated troponin suspected due to demand ischemia Troponin mildly elevated in the 90s with a flat trend, then  down to 82.  No chest pain and EKG without acute changes CTA chest negative for pulmonary embolism. Follow 2D echo  ?  Chronic LUQ abdominal pain CT A/P with contrast without acute findings. Outpatient follow-up.  Hypothyroidism Continue home dose of levothyroxine  Hyperlipidemia Continue statins  Essential hypertension Unclear if taking Benicar 5 Mg daily or not.  Hold currently due to soft blood pressures.  Neurocognitive disorder due to Alzheimer's disease Patient appears to be sometimes mildly confused with inappropriate responses but do not know his baseline. Monitor.  Hypogonadism  Body mass index is 38.75 kg/m./Obesity Complicates care  DVT prophylaxis: enoxaparin (LOVENOX) injection 40 mg Start: 05/27/23 1000     Code Status: Limited: Do not attempt resuscitation (DNR) -DNR-LIMITED -Do Not Intubate/DNI :  Family Communication: After permission from the patient, discussed with daughter/HCPOA via phone, updated care and answered all questions.  She does confirm that he does have mild dementia with issues with short-term memory and appeared slightly more confused than his baseline, probably related to the infection. Disposition:  Status is: Inpatient Remains inpatient appropriate because: IV antibiotics     Consultants:     Procedures:     Antimicrobials:   IV ceftriaxone   Subjective:  Seen this morning.  Appeared slightly confused.  Stated that his fungal infection was actually in the opposite foot but provider note indicates right foot, same side as the right leg cellulitis.  No pain reported.  No other complaints.  Objective:   Vitals:   05/27/23 0400 05/27/23 0549 05/27/23 0842 05/27/23 1226  BP: (!) 108/56 103/65 (!) 112/58 104/61  Pulse: 67 72 73 68  Resp: [name])  21 18 14 18   Temp: 98.9 F (37.2 C) 99 F (37.2 C) 99.5 F (37.5 C) 100 F (37.8 C)  TempSrc: Oral Oral Oral Oral  SpO2: 99% 99% 95% 97%  Weight:      Height:        General exam:  Elderly male, moderately built and obese lying comfortably propped up in bed without distress.  Did not look septic or toxic. Respiratory system: Clear to auscultation. Respiratory effort normal. Cardiovascular system: S1 & S2 heard, RRR. No JVD, murmurs, rubs, gallops or clicks. No pedal edema.  Although telemetry order was in place, was not on telemetry this morning. Gastrointestinal system: Abdomen is nondistended, soft and nontender. No organomegaly or masses felt. Normal bowel sounds heard. Central nervous system: Alert and oriented. No focal neurological deficits. Extremities: Symmetric 5 x 5 power. Skin: Right leg below-knee mildly swollen, warm compared to the left leg, patchy redness, mildly tender.  Has macerated skin with small crack in the third webspace of right foot and may be a smaller one even on the fourth webspace. Psychiatry: Judgement and insight appear somewhat impaired. Mood & affect appropriate.     Data Reviewed:   I have personally reviewed following labs and imaging studies   CBC: Recent Labs  Lab 05/27/23 0005 05/27/23 0836  WBC 19.2* 15.3*  NEUTROABS 17.5*  --   HGB 16.4 14.8  HCT 47.8 42.4  MCV 89.5 88.9  PLT 260 225    Basic Metabolic Panel: Recent Labs  Lab 05/27/23 0005 05/27/23 0836  NA 135  --   K 3.9  --   CL 101  --   CO2 22  --   GLUCOSE 152*  --   BUN 16  --   CREATININE 1.17 1.05  CALCIUM 9.2  --     Liver Function Tests: Recent Labs  Lab 05/27/23 0005  AST 25  ALT 21  ALKPHOS 44  BILITOT 1.8*  PROT 7.2  ALBUMIN 3.8    CBG: No results for input(s): "GLUCAP" in the last 168 hours.  Microbiology Studies:   Recent Results (from the past 240 hours)  Culture, blood (Routine x 2)     Status: None (Preliminary result)   Collection Time: 05/26/23 11:45 PM   Specimen: BLOOD RIGHT ARM  Result Value Ref Range Status   Specimen Description BLOOD RIGHT ARM  Final   Special Requests   Final    BOTTLES DRAWN AEROBIC AND  ANAEROBIC Blood Culture results may not be optimal due to an inadequate volume of blood received in culture bottles Performed at [facility], [address] [place], [place] [address]    Culture PENDING  Incomplete   Report Status PENDING  Incomplete  Culture, blood (Routine x 2)     Status: None (Preliminary result)   Collection Time: 05/27/23 12:05 AM   Specimen: BLOOD RIGHT HAND  Result Value Ref Range Status   Specimen Description BLOOD RIGHT HAND  Final   Special Requests   Final    BOTTLES DRAWN AEROBIC ONLY Blood Culture results may not be optimal due to an inadequate volume of blood received in culture bottles Performed at [facility], [address], [place], [place] [address]    Culture PENDING  Incomplete   Report Status PENDING  Incomplete  Resp panel by RT-PCR (RSV, Flu A&B, Covid) Anterior Nasal Swab     Status: None   Collection Time: 05/27/23  1:15 AM   Specimen: Anterior  Nasal Swab  Result Value Ref Range Status   SARS Coronavirus 2 by RT PCR NEGATIVE NEGATIVE Final   Influenza A by PCR NEGATIVE NEGATIVE Final   Influenza B by PCR NEGATIVE NEGATIVE Final    Comment: (NOTE) The Xpert Xpress SARS-CoV-2/FLU/RSV plus assay is intended as an aid in the diagnosis of influenza from Nasopharyngeal swab specimens and should not be used as a sole basis for treatment. Nasal washings and aspirates are unacceptable for Xpert Xpress SARS-CoV-2/FLU/RSV testing.  Fact Sheet for Patients: BloggerCourse.com  Fact Sheet for Healthcare Providers: SeriousBroker.it  This test is not yet approved or cleared by the [place] FDA and has been authorized for detection and/or diagnosis of SARS-CoV-2 by FDA under an Emergency Use Authorization (EUA). This EUA will remain in effect (meaning this test can be used) for the duration of the COVID-19 declaration under Section 564(b)(1) of the Act, 21 U.S.C. section  360bbb-3(b)(1), unless the authorization is terminated or revoked.     Resp Syncytial Virus by PCR NEGATIVE NEGATIVE Final    Comment: (NOTE) Fact Sheet for Patients: BloggerCourse.com  Fact Sheet for Healthcare Providers: SeriousBroker.it  This test is not yet approved or cleared by the [place] FDA and has been authorized for detection and/or diagnosis of SARS-CoV-2 by FDA under an Emergency Use Authorization (EUA). This EUA will remain in effect (meaning this test can be used) for the duration of the COVID-19 declaration under Section 564(b)(1) of the Act, 21 U.S.C. section 360bbb-3(b)(1), unless the authorization is terminated or revoked.  Performed at [facility], [address] [place], [place] [address]     Radiology Studies:  CT Angio Chest PE W and/or Wo Contrast Result Date: 05/27/2023 CLINICAL DATA:  Shortness of breath, sepsis, and left lower quadrant pain. EXAM: CT ANGIOGRAPHY CHEST CT ABDOMEN AND PELVIS WITH CONTRAST TECHNIQUE: Multidetector CT imaging of the chest was performed using the standard protocol during bolus administration of intravenous contrast. Multiplanar CT image reconstructions and MIPs were obtained to evaluate the vascular anatomy. Multidetector CT imaging of the abdomen and pelvis was performed using the standard protocol during bolus administration of intravenous contrast. RADIATION DOSE REDUCTION: This exam was performed according to the departmental dose-optimization program which includes automated exposure control, adjustment of the mA and/or kV according to patient size and/or use of iterative reconstruction technique. CONTRAST:  75mL OMNIPAQUE IOHEXOL 350 MG/ML SOLN COMPARISON:  PA lateral chest today, PA chest with rib series 05/10/2023, CT chest, abdomen and pelvis with IV contrast 05/10/2023, and CTA chest 06/24/2022. FINDINGS: CTA CHEST FINDINGS Cardiovascular: The pulmonary arteries  are normal caliber and do not show evidence of embolic filling defects. The cardiac size is normal. There are scattered three-vessel calcifications in the coronary arteries. There is no pericardial effusion, no venous dilatation. There is mild aortic tortuosity and atherosclerosis without aneurysm, dissection or stenosis. The great vessels are clear. Mediastinum/Nodes: No enlarged mediastinal, hilar, or axillary lymph nodes. Thyroid gland, trachea, and esophagus demonstrate no significant findings. Lungs/Pleura: Small area with chronic branching micronodular disease lateral left upper lobe, findings consistent with small airways impactions. There is increased linear scarring or atelectasis in the right lower lobe interval decreased atelectasis in the right middle lobe. Small airway impactions also chronically noted in the posterior right upper lobe left apex. There is no consolidation, effusion or pneumothorax.  Next Musculoskeletal: Multilevel thoracic spine bridging enthesopathy, degenerative discs, spondylosis, and thoracic kyphosis. No acute or other significant osseous findings. There are healed fracture deformities of  some of the anterolateral left ribs. Bilateral discoid gynecomastia appears similar. There is eventration and mild chronic elevation of the anterior right diaphragm. No chest wall mass is seen. Review of the MIP images confirms the above findings. CT ABDOMEN and PELVIS FINDINGS Hepatobiliary: The liver is 18 cm in length mildly steatotic. Status post cholecystectomy with no biliary dilatation. Fall Pancreas: No abnormality. Spleen: No abnormality. Adrenals/Urinary Tract: There is no adrenal or renal mass enhancement. There are small Bosniak 1 cysts in the inferior pole of the kidneys but no mass enhancement of the kidneys. There is no urinary stone or obstruction. There are few renovascular calcifications at the left renal hilum. The lower pelvic ureters and the bladder are all obscured due to  extensive metallic artifact from bilateral hip replacements. Stomach/Bowel: Unremarkable contracted stomach. Normal caliber small bowel without wall thickening or inflammatory change. Normal subcecal appendix. There is colonic diverticulosis without evidence of wall thickening or diverticulitis. Vascular/Lymphatic: Aortic atherosclerosis. No enlarged abdominal or pelvic lymph nodes. Reproductive: Prostate obscured by the hip replacements. Other: Small inguinal fat hernias. Small umbilical fat hernia. No incarcerated hernia. No free fluid, free air or focal inflammatory process. Pelvic phleboliths. Musculoskeletal: Chronic bilateral hip replacements. Advanced degenerative change lumbar spine. Ankylosis superior left SI joint. Acquired spinal stenosis L4-5. Review of the MIP images confirms the above findings. IMPRESSION: 1. No evidence of arterial dilatation, embolus or other acute chest CT or CTA findings. 2. Aortic and coronary artery atherosclerosis. 3. 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 4. No acute CT findings in the abdomen or pelvis. 5. Diverticulosis without evidence of diverticulitis. 6. Small umbilical and inguinal fat hernias. 7. Mild hepatic steatosis. 8. Chronic bilateral hip replacements with extensive metallic artifact obscuring the lower pelvic ureters, prostate and bladder. 9. Acquired spinal stenosis L4-5. 10. Discoid gynecomastia. Electronically Signed   By: Almira Bar M.D.   On: 05/27/2023 02:29   CT ABDOMEN PELVIS W CONTRAST Result Date: 05/27/2023 CLINICAL DATA:  Shortness of breath, sepsis, and left lower quadrant pain. EXAM: CT ANGIOGRAPHY CHEST CT ABDOMEN AND PELVIS WITH CONTRAST TECHNIQUE: Multidetector CT imaging of the chest was performed using the standard protocol during bolus administration of intravenous contrast. Multiplanar CT image reconstructions and MIPs were obtained to evaluate the vascular anatomy. Multidetector CT imaging of the abdomen and pelvis was  performed using the standard protocol during bolus administration of intravenous contrast. RADIATION DOSE REDUCTION: This exam was performed according to the departmental dose-optimization program which includes automated exposure control, adjustment of the mA and/or kV according to patient size and/or use of iterative reconstruction technique. CONTRAST:  75mL OMNIPAQUE IOHEXOL 350 MG/ML SOLN COMPARISON:  PA lateral chest today, PA chest with rib series 05/10/2023, CT chest, abdomen and pelvis with IV contrast 05/10/2023, and CTA chest 06/24/2022. FINDINGS: CTA CHEST FINDINGS Cardiovascular: The pulmonary arteries are normal caliber and do not show evidence of embolic filling defects. The cardiac size is normal. There are scattered three-vessel calcifications in the coronary arteries. There is no pericardial effusion, no venous dilatation. There is mild aortic tortuosity and atherosclerosis without aneurysm, dissection or stenosis. The great vessels are clear. Mediastinum/Nodes: No enlarged mediastinal, hilar, or axillary lymph nodes. Thyroid gland, trachea, and esophagus demonstrate no significant findings. Lungs/Pleura: Small area with chronic branching micronodular disease lateral left upper lobe, findings consistent with small airways impactions. There is increased linear scarring or atelectasis in the right lower lobe interval decreased atelectasis in the right middle lobe. Small airway impactions also chronically noted  in the posterior right upper lobe left apex. There is no consolidation, effusion or pneumothorax.  Next Musculoskeletal: Multilevel thoracic spine bridging enthesopathy, degenerative discs, spondylosis, and thoracic kyphosis. No acute or other significant osseous findings. There are healed fracture deformities of some of the anterolateral left ribs. Bilateral discoid gynecomastia appears similar. There is eventration and mild chronic elevation of the anterior right diaphragm. No chest wall mass  is seen. Review of the MIP images confirms the above findings. CT ABDOMEN and PELVIS FINDINGS Hepatobiliary: The liver is 18 cm in length mildly steatotic. Status post cholecystectomy with no biliary dilatation. Fall Pancreas: No abnormality. Spleen: No abnormality. Adrenals/Urinary Tract: There is no adrenal or renal mass enhancement. There are small Bosniak 1 cysts in the inferior pole of the kidneys but no mass enhancement of the kidneys. There is no urinary stone or obstruction. There are few renovascular calcifications at the left renal hilum. The lower pelvic ureters and the bladder are all obscured due to extensive metallic artifact from bilateral hip replacements. Stomach/Bowel: Unremarkable contracted stomach. Normal caliber small bowel without wall thickening or inflammatory change. Normal subcecal appendix. There is colonic diverticulosis without evidence of wall thickening or diverticulitis. Vascular/Lymphatic: Aortic atherosclerosis. No enlarged abdominal or pelvic lymph nodes. Reproductive: Prostate obscured by the hip replacements. Other: Small inguinal fat hernias. Small umbilical fat hernia. No incarcerated hernia. No free fluid, free air or focal inflammatory process. Pelvic phleboliths. Musculoskeletal: Chronic bilateral hip replacements. Advanced degenerative change lumbar spine. Ankylosis superior left SI joint. Acquired spinal stenosis L4-5. Review of the MIP images confirms the above findings. IMPRESSION: 1. No evidence of arterial dilatation, embolus or other acute chest CT or CTA findings. 2. Aortic and coronary artery atherosclerosis. 3. 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 4. No acute CT findings in the abdomen or pelvis. 5. Diverticulosis without evidence of diverticulitis. 6. Small umbilical and inguinal fat hernias. 7. Mild hepatic steatosis. 8. Chronic bilateral hip replacements with extensive metallic artifact obscuring the lower pelvic ureters, prostate and bladder. 9.  Acquired spinal stenosis L4-5. 10. Discoid gynecomastia. Electronically Signed   By: Almira Bar M.D.   On: 05/27/2023 02:29   DG Chest 2 View Result Date: 05/27/2023 CLINICAL DATA:  Shortness of breath, dry cough, chest pain. EXAM: CHEST - 2 VIEW COMPARISON:  Rib series and PA chest 05/10/2023 FINDINGS: The heart size and mediastinal contours are within normal limits. There is a low inspiration, with exaggerated AP chest diameter. The lungs are clear of infiltrates. The visualized skeletal structures are intact with osteopenia and thoracic kyphosis and spondylosis. IMPRESSION: Low inspiration. No evidence of acute chest disease or interval changes. Electronically Signed   By: Almira Bar M.D.   On: 05/27/2023 00:23    Scheduled Meds:    enoxaparin (LOVENOX) injection  40 mg Subcutaneous Daily   hydrOXYzine  25 mg Oral QHS   levothyroxine  175 mcg Oral QAC breakfast   rosuvastatin  5 mg Oral Daily    Continuous Infusions:    cefTRIAXone (ROCEPHIN)  IV     lactated ringers 50 mL/hr at 05/27/23 1221     LOS: 0 days     Marcellus Scott, MD,  FACP, [facility], [facility], [facility]   [facility] [place]      To contact the attending provider between 7A-7P or the covering provider during after hours 7P-7A, please log into the web site [email or web address] and access using universal [place] password for that web site. If you  do not have the password, please call the hospital operator.  05/27/2023, 2:15 PM

## 2023-05-28 ENCOUNTER — Other Ambulatory Visit: Payer: Self-pay | Admitting: Internal Medicine

## 2023-05-28 DIAGNOSIS — L03115 Cellulitis of right lower limb: Secondary | ICD-10-CM | POA: Diagnosis not present

## 2023-05-28 DIAGNOSIS — E039 Hypothyroidism, unspecified: Secondary | ICD-10-CM

## 2023-05-28 LAB — COMPREHENSIVE METABOLIC PANEL WITH GFR
ALT: 23 U/L (ref 0–44)
AST: 28 U/L (ref 15–41)
Albumin: 3.3 g/dL — ABNORMAL LOW (ref 3.5–5.0)
Alkaline Phosphatase: 54 U/L (ref 38–126)
Anion gap: 11 (ref 5–15)
BUN: 14 mg/dL (ref 8–23)
CO2: 24 mmol/L (ref 22–32)
Calcium: 9.2 mg/dL (ref 8.9–10.3)
Chloride: 101 mmol/L (ref 98–111)
Creatinine, Ser: 1.1 mg/dL (ref 0.61–1.24)
GFR, Estimated: >60 mL/min (ref >60)
Glucose, Bld: 127 mg/dL — ABNORMAL HIGH (ref 70–99)
Potassium: 3.8 mmol/L (ref 3.5–5.1)
Sodium: 136 mmol/L (ref 135–145)
Total Bilirubin: 1.5 mg/dL — ABNORMAL HIGH (ref 0.0–1.2)
Total Protein: 7 g/dL (ref 6.5–8.1)

## 2023-05-28 LAB — CBC
HCT: 46.2 % (ref 39.0–52.0)
Hemoglobin: 15.7 g/dL (ref 13.0–17.0)
MCH: 30.7 pg (ref 26.0–34.0)
MCHC: 34 g/dL (ref 30.0–36.0)
MCV: 90.2 fL (ref 80.0–100.0)
Platelets: 230 10*3/uL (ref 150–400)
RBC: 5.12 MIL/uL (ref 4.22–5.81)
RDW: 12.7 % (ref 11.5–15.5)
WBC: 15.7 10*3/uL — ABNORMAL HIGH (ref 4.0–10.5)
nRBC: 0 % (ref 0.0–0.2)

## 2023-05-28 LAB — MRSA NEXT GEN BY PCR, NASAL: MRSA by PCR Next Gen: NOT DETECTED

## 2023-05-28 NOTE — Plan of Care (Signed)

## 2023-05-28 NOTE — Progress Notes (Signed)
 PROGRESS NOTE   Jonathan Powers  GNF:[id number]    DOB: 05-29-1950    DOA: 05/26/2023  PCP: [name], MD   I have briefly reviewed patients previous medical records in [facility].  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  Although patient denied any trauma, insect bite or his dog licking his right lower extremity, recently seen in orthopedic office on 3/26 for right foot pain and treated for tinea pedis of the webspaces.  Admitted for right lower extremity cellulitis.   Assessment & Plan:  Active Problems:   Cellulitis   Right leg cellulitis Likely source of entry was tenia pedis with wound noted in the third right foot webspace and may be a tiny 1 even in the fourth webspace. Although patient presented with fever of 102.7 F, transient tachypnea, and leukocytosis, there was no evidence of target organ dysfunction.  Lactate was normal x 2.  Sepsis ruled out. Continue empirically started IV ceftriaxone.  Blood cultures x 2: Negative to date. Influenza/RSV/COVID-19 testing negative. CTA chest PE protocol, CT abdomen and pelvis with contrast: No PE and no acute abnormalities RLE venous Doppler negative for DVT. Elevate RLE.  Multimodality pain control. Leukocytosis has plateaued in the 15K range.  Follow daily CBCs. Continue topical antifungal to webspaces.  Patient counseled regarding importance of keeping his feet/webspaces clean and dry and monitoring closely at home. Defervesced but had low-grade fever of 101 F last night, leg exam not much change since yesterday.  Check MRSA screen and if positive, consider  adding staph coverage.  Elevated troponin suspected due to demand ischemia Troponin mildly elevated in the 90s with a flat trend, then down to 82.  No chest pain and EKG without acute changes CTA chest negative for pulmonary embolism. 2D echo with preserved LVEF.  ?  Chronic LUQ abdominal pain CT A/P with contrast without acute findings. Outpatient follow-up.  Has not complained of abdominal pain since being here to me.  Hypothyroidism Continue home dose of levothyroxine  Hyperlipidemia Continue statins  Essential hypertension Unclear if taking Benicar 5 Mg daily or not.  Controlled off of Benicar currently.  Neurocognitive disorder due to Alzheimer's disease Patient appears to be sometimes mildly confused with inappropriate responses but do not know his baseline. Monitor. As per discussion with patient's daughter/HCPOA, patient has short term memory impairment.  Also more confused here than his baseline.  Hypogonadism  Body mass index is 38.75 kg/m./Obesity Complicates care  DVT prophylaxis: enoxaparin (LOVENOX) injection 40 mg Start: 05/27/23 1000     Code Status: Limited: Do not attempt resuscitation (DNR) -DNR-LIMITED -Do Not Intubate/DNI :  Family Communication: 4/6: After permission from the patient, discussed with daughter/HCPOA via phone, updated care and answered all questions.  She does confirm that he does have mild dementia with issues with short-term memory and appeared slightly more confused than his baseline, probably related to the infection. Disposition:  Status is: Inpatient Remains inpatient appropriate because: IV antibiotics     Consultants:     Procedures:     Antimicrobials:   IV ceftriaxone   Subjective:  RN messaged patient this morning stating that patient told her that he had not  seen a provider since he had been here and wanted to speak with 1.  He obviously did not recollect that I had seen him yesterday.  Interviewed and examined  patient.  Stated that he was "confused".  Reported right leg pain.  No other complaints.  Objective:   Vitals:   05/27/23 1724 05/27/23 2048 05/28/23 0501 05/28/23 0831  BP: 115/62 (!) 105/52 123/66 115/73  Pulse: 77 68 70 65  Resp: 18 18 18 18   Temp: (!) 101 F (38.3 C) 99.2 F (37.3 C) 99.2 F (37.3 C) 98.2 F (36.8 C)  TempSrc: Oral Oral Oral   SpO2: 97% 96% 94% 95%  Weight:      Height:        General exam: Elderly male, moderately built and obese lying comfortably propped up in bed without distress.  Did not look septic or toxic.  Lying essentially naked and had taken off all his clothes and had covered himself with blanket. Respiratory system: Clear to auscultation. Respiratory effort normal. Cardiovascular system: S1 & S2 heard, RRR. No JVD, murmurs, rubs, gallops or clicks. No pedal edema.  Telemetry personally reviewed: Sinus rhythm. Gastrointestinal system: Abdomen is nondistended, soft and nontender. No organomegaly or masses felt. Normal bowel sounds heard. Central nervous system: Alert and oriented x 2 but appeared somewhat confused. No focal neurological deficits. Extremities: Symmetric 5 x 5 power. Skin: Right leg below-knee mildly swollen, warm compared to the left leg, patchy redness, mildly tender.  Has macerated skin with small crack in the third webspace of right foot and may be a smaller one even on the fourth webspace.  Findings may be slightly better compared to yesterday.  Details today as in picture below from 4/7. Psychiatry: Judgement and insight appear somewhat impaired. Mood & affect appropriate.        Data Reviewed:   I have personally reviewed following labs and imaging studies   CBC: Recent Labs  Lab 05/27/23 0005 05/27/23 0836 05/28/23 0646  WBC 19.2* 15.3* 15.7*  NEUTROABS 17.5*  --   --   HGB 16.4 14.8 15.7  HCT 47.8 42.4 46.2  MCV 89.5 88.9 90.2  PLT 260 225 230    Basic Metabolic Panel: Recent Labs  Lab 05/27/23 0005  05/27/23 0836 05/28/23 0646  NA 135  --  136  K 3.9  --  3.8  CL 101  --  101  CO2 22  --  24  GLUCOSE 152*  --  127*  BUN 16  --  14  CREATININE 1.17 1.05 1.10  CALCIUM 9.2  --  9.2    Liver Function Tests: Recent Labs  Lab 05/27/23 0005 05/28/23 0646  AST 25 28  ALT 21 23  ALKPHOS 44 54  BILITOT 1.8* 1.5*  PROT 7.2 7.0  ALBUMIN 3.8 3.3*    CBG: No results for input(s): "GLUCAP" in the last 168 hours.  Microbiology Studies:   Recent Results (from the past 240 hours)  Culture, blood (Routine x 2)     Status: None (Preliminary result)   Collection Time: 05/26/23 11:45 PM   Specimen: BLOOD RIGHT ARM  Result Value Ref Range Status   Specimen Description BLOOD RIGHT ARM  Final   Special Requests   Final    BOTTLES DRAWN AEROBIC AND ANAEROBIC Blood Culture results may not be optimal due to an inadequate volume of blood received in culture bottles   Culture   Final    NO GROWTH 1 DAY Performed at [facility]  [facility], [address] [place], [place] [address]    Report Status PENDING  Incomplete  Culture, blood (Routine x 2)     Status: None (Preliminary result)   Collection Time: 05/27/23 12:05 AM   Specimen: BLOOD RIGHT HAND  Result Value Ref Range Status   Specimen Description BLOOD RIGHT HAND  Final   Special Requests   Final    BOTTLES DRAWN AEROBIC ONLY Blood Culture results may not be optimal due to an inadequate volume of blood received in culture bottles   Culture   Final    NO GROWTH 1 DAY Performed at [facility], [address] [place], [place] [address]    Report Status PENDING  Incomplete  Resp panel by RT-PCR (RSV, Flu A&B, Covid) Anterior Nasal Swab     Status: None   Collection Time: 05/27/23  1:15 AM   Specimen: Anterior Nasal Swab  Result Value Ref Range Status   SARS Coronavirus 2 by RT PCR NEGATIVE NEGATIVE Final   Influenza A by PCR NEGATIVE NEGATIVE Final   Influenza B by PCR NEGATIVE NEGATIVE Final    Comment: (NOTE) The Xpert  Xpress SARS-CoV-2/FLU/RSV plus assay is intended as an aid in the diagnosis of influenza from Nasopharyngeal swab specimens and should not be used as a sole basis for treatment. Nasal washings and aspirates are unacceptable for Xpert Xpress SARS-CoV-2/FLU/RSV testing.  Fact Sheet for Patients: BloggerCourse.com  Fact Sheet for Healthcare Providers: SeriousBroker.it  This test is not yet approved or cleared by the [place] FDA and has been authorized for detection and/or diagnosis of SARS-CoV-2 by FDA under an Emergency Use Authorization (EUA). This EUA will remain in effect (meaning this test can be used) for the duration of the COVID-19 declaration under Section 564(b)(1) of the Act, 21 U.S.C. section 360bbb-3(b)(1), unless the authorization is terminated or revoked.     Resp Syncytial Virus by PCR NEGATIVE NEGATIVE Final    Comment: (NOTE) Fact Sheet for Patients: BloggerCourse.com  Fact Sheet for Healthcare Providers: SeriousBroker.it  This test is not yet approved or cleared by the [place] FDA and has been authorized for detection and/or diagnosis of SARS-CoV-2 by FDA under an Emergency Use Authorization (EUA). This EUA will remain in effect (meaning this test can be used) for the duration of the COVID-19 declaration under Section 564(b)(1) of the Act, 21 U.S.C. section 360bbb-3(b)(1), unless the authorization is terminated or revoked.  Performed at [facility], [address], [place], [place] [address]     Radiology Studies:  VAS [place] LOWER EXTREMITY VENOUS (DVT) Result Date: 05/27/2023  Lower Venous DVT Study Patient Name:  Jonathan Powers  Date of Exam:   05/27/2023 Medical Rec #: [id number]         Accession #:    [id number] Date of Birth: 1950/03/09         Patient Gender: M Patient Age:   33 years Exam Location:  [facility] Procedure:      VAS  [place] LOWER EXTREMITY VENOUS (DVT) Referring Phys: Enid Derry HALL --------------------------------------------------------------------------------  Indications: Pain, Swelling, Erythema, and Cellulitis.  Limitations: Edema, pain with compression maneuver and body habitus. Comparison Study: No prior right LEV on file Performing Technologist: Sherren Kerns RVS  Examination Guidelines: A complete evaluation includes B-mode imaging, spectral Doppler, color Doppler, and power Doppler as needed of all accessible portions of each vessel. Bilateral testing is considered an integral part of a complete examination. Limited examinations for reoccurring  indications may be performed as noted. The reflux portion of the exam is performed with the patient in reverse Trendelenburg.  +---------+---------------+---------+-----------+----------+-------------------+ RIGHT    CompressibilityPhasicitySpontaneityPropertiesThrombus Aging      +---------+---------------+---------+-----------+----------+-------------------+ CFV      Full           Yes      Yes                                      +---------+---------------+---------+-----------+----------+-------------------+ SFJ      Full                                                             +---------+---------------+---------+-----------+----------+-------------------+ FV Prox  Full                                                             +---------+---------------+---------+-----------+----------+-------------------+ FV Mid   Full                                                             +---------+---------------+---------+-----------+----------+-------------------+ FV DistalFull                                                             +---------+---------------+---------+-----------+----------+-------------------+ PFV      Full                                                              +---------+---------------+---------+-----------+----------+-------------------+ POP      Full           Yes      Yes                                      +---------+---------------+---------+-----------+----------+-------------------+ PTV                                                   patent by color     +---------+---------------+---------+-----------+----------+-------------------+ PERO                                                  Not well  visualized +---------+---------------+---------+-----------+----------+-------------------+   +----+---------------+---------+-----------+----------+--------------+ LEFTCompressibilityPhasicitySpontaneityPropertiesThrombus Aging +----+---------------+---------+-----------+----------+--------------+ CFV Full           Yes      Yes                                 +----+---------------+---------+-----------+----------+--------------+    Summary: RIGHT: - There is no evidence of deep vein thrombosis in the lower extremity. However, portions of this examination were limited- see technologist comments above.  - No cystic structure found in the popliteal fossa. - Ultrasound characteristics of enlarged lymph nodes are noted in the groin.  LEFT: - No evidence of common femoral vein obstruction.   *See table(s) above for measurements and observations.    Preliminary    ECHOCARDIOGRAM COMPLETE Result Date: 05/27/2023    ECHOCARDIOGRAM REPORT   Patient Name:   Jonathan Powers Date of Exam: 05/27/2023 Medical Rec #:  [id number]        Height:       70.0 in Accession #:    [id number]       Weight:       270.1 lb Date of Birth:  Apr 06, 1950        BSA:          2.372 m Patient Age:    72 years         BP:           104/61 mmHg Patient Gender: M                HR:           76 bpm. Exam Location:  Inpatient Procedure: 2D Echo, Cardiac Doppler, Color Doppler and Strain Analysis (Both            Spectral and Color Flow Doppler were utilized during  procedure). Indications:    Elevated Troponin  History:        Patient has prior history of Echocardiogram examinations, most                 recent 12/27/2018. Risk Factors:Hypertension, GERD and Sleep                 Apnea. Hypothyroidism.  Sonographer:    Raeford Razor RDCS Referring Phys: [id number] CAROLE N HALL IMPRESSIONS  1. Left ventricular ejection fraction, by estimation, is 60 to 65%. The left ventricle has normal function. The left ventricle has no regional wall motion abnormalities. There is moderate left ventricular hypertrophy. Left ventricular diastolic parameters are consistent with Grade I diastolic dysfunction (impaired relaxation). The average left ventricular global longitudinal strain is -21.3 %. The global longitudinal strain is normal.  2. Right ventricular systolic function is normal. The right ventricular size is normal. Tricuspid regurgitation signal is inadequate for assessing PA pressure.  3. The mitral valve is grossly normal. Trivial mitral valve regurgitation.  4. The aortic valve is tricuspid. Aortic valve regurgitation is not visualized.  5. Aortic dilatation noted. There is borderline dilatation of the ascending aorta, measuring 38 mm.  6. The inferior vena cava is dilated in size with >50% respiratory variability, suggesting right atrial pressure of 8 mmHg. FINDINGS  Left Ventricle: Left ventricular ejection fraction, by estimation, is 60 to 65%. The left ventricle has normal function. The left ventricle has no regional wall motion abnormalities. The average left ventricular global longitudinal strain is -21.3 %. Strain was performed and the global longitudinal strain is normal. The left ventricular internal  cavity size was normal in size. There is moderate left ventricular hypertrophy. Left ventricular diastolic parameters are consistent with Grade I diastolic dysfunction (impaired relaxation). Indeterminate filling pressures. Right Ventricle: The right ventricular size is normal. No  increase in right ventricular wall thickness. Right ventricular systolic function is normal. Tricuspid regurgitation signal is inadequate for assessing PA pressure. Left Atrium: Left atrial size was normal in size. Right Atrium: Right atrial size was normal in size. Pericardium: There is no evidence of pericardial effusion. Mitral Valve: The mitral valve is grossly normal. Trivial mitral valve regurgitation. Tricuspid Valve: The tricuspid valve is normal in structure. Tricuspid valve regurgitation is not demonstrated. Aortic Valve: The aortic valve is tricuspid. Aortic valve regurgitation is not visualized. Aortic valve mean gradient measures 7.0 mmHg. Aortic valve peak gradient measures 12.1 mmHg. Aortic valve area, by VTI measures 3.73 cm. Pulmonic Valve: The pulmonic valve was normal in structure. Pulmonic valve regurgitation is not visualized. Aorta: Aortic dilatation noted. There is borderline dilatation of the ascending aorta, measuring 38 mm. Venous: The inferior vena cava is dilated in size with greater than 50% respiratory variability, suggesting right atrial pressure of 8 mmHg. IAS/Shunts: No atrial level shunt detected by color flow Doppler.  LEFT VENTRICLE PLAX 2D LVIDd:         4.30 cm   Diastology LVIDs:         3.30 cm   LV e' medial:    6.13 cm/s LV PW:         1.10 cm   LV E/e' medial:  9.9 LV IVS:        1.30 cm   LV e' lateral:   8.16 cm/s LVOT diam:     2.20 cm   LV E/e' lateral: 7.4 LV SV:         120 LV SV Index:   50        2D Longitudinal Strain LVOT Area:     3.80 cm  2D Strain GLS (A4C):   -21.2 %                          2D Strain GLS (A3C):   -22.0 %                          2D Strain GLS (A2C):   -20.8 %                          2D Strain GLS Avg:     -21.3 % RIGHT VENTRICLE             IVC RV Basal diam:  4.50 cm     IVC diam: 2.20 cm RV Mid diam:    2.20 cm RV S prime:     17.40 cm/s TAPSE (M-mode): 2.7 cm LEFT ATRIUM             Index        RIGHT ATRIUM           Index LA diam:         4.10 cm 1.73 cm/m   RA Area:     16.10 cm LA Vol (A2C):   82.1 ml 34.62 ml/m  RA Volume:   43.20 ml  18.22 ml/m LA Vol (A4C):   47.4 ml 19.99 ml/m LA Biplane Vol: 62.2 ml 26.23 ml/m  AORTIC VALVE AV Area (Vmax):  3.47 cm AV Area (Vmean):   3.41 cm AV Area (VTI):     3.73 cm AV Vmax:           174.00 cm/s AV Vmean:          118.000 cm/s AV VTI:            0.321 m AV Peak Grad:      12.1 mmHg AV Mean Grad:      7.0 mmHg LVOT Vmax:         159.00 cm/s LVOT Vmean:        106.000 cm/s LVOT VTI:          0.315 m LVOT/AV VTI ratio: 0.98  AORTA Ao Root diam: 3.40 cm Ao Asc diam:  3.60 cm MITRAL VALVE MV Area (PHT): 2.87 cm    SHUNTS MV Decel Time: 264 msec    Systemic VTI:  0.32 m MV E velocity: 60.40 cm/s  Systemic Diam: 2.20 cm MV A velocity: 72.70 cm/s MV E/A ratio:  0.83 Zoila Shutter MD Electronically signed by Zoila Shutter MD Signature Date/Time: 05/27/2023/5:03:43 PM    Final    CT Angio Chest PE W and/or Wo Contrast Result Date: 05/27/2023 CLINICAL DATA:  Shortness of breath, sepsis, and left lower quadrant pain. EXAM: CT ANGIOGRAPHY CHEST CT ABDOMEN AND PELVIS WITH CONTRAST TECHNIQUE: Multidetector CT imaging of the chest was performed using the standard protocol during bolus administration of intravenous contrast. Multiplanar CT image reconstructions and MIPs were obtained to evaluate the vascular anatomy. Multidetector CT imaging of the abdomen and pelvis was performed using the standard protocol during bolus administration of intravenous contrast. RADIATION DOSE REDUCTION: This exam was performed according to the departmental dose-optimization program which includes automated exposure control, adjustment of the mA and/or kV according to patient size and/or use of iterative reconstruction technique. CONTRAST:  75mL OMNIPAQUE IOHEXOL 350 MG/ML SOLN COMPARISON:  PA lateral chest today, PA chest with rib series 05/10/2023, CT chest, abdomen and pelvis with IV contrast 05/10/2023, and CTA chest  06/24/2022. FINDINGS: CTA CHEST FINDINGS Cardiovascular: The pulmonary arteries are normal caliber and do not show evidence of embolic filling defects. The cardiac size is normal. There are scattered three-vessel calcifications in the coronary arteries. There is no pericardial effusion, no venous dilatation. There is mild aortic tortuosity and atherosclerosis without aneurysm, dissection or stenosis. The great vessels are clear. Mediastinum/Nodes: No enlarged mediastinal, hilar, or axillary lymph nodes. Thyroid gland, trachea, and esophagus demonstrate no significant findings. Lungs/Pleura: Small area with chronic branching micronodular disease lateral left upper lobe, findings consistent with small airways impactions. There is increased linear scarring or atelectasis in the right lower lobe interval decreased atelectasis in the right middle lobe. Small airway impactions also chronically noted in the posterior right upper lobe left apex. There is no consolidation, effusion or pneumothorax.  Next Musculoskeletal: Multilevel thoracic spine bridging enthesopathy, degenerative discs, spondylosis, and thoracic kyphosis. No acute or other significant osseous findings. There are healed fracture deformities of some of the anterolateral left ribs. Bilateral discoid gynecomastia appears similar. There is eventration and mild chronic elevation of the anterior right diaphragm. No chest wall mass is seen. Review of the MIP images confirms the above findings. CT ABDOMEN and PELVIS FINDINGS Hepatobiliary: The liver is 18 cm in length mildly steatotic. Status post cholecystectomy with no biliary dilatation. Fall Pancreas: No abnormality. Spleen: No abnormality. Adrenals/Urinary Tract: There is no adrenal or renal mass enhancement. There are small Bosniak 1 cysts in the inferior pole  of the kidneys but no mass enhancement of the kidneys. There is no urinary stone or obstruction. There are few renovascular calcifications at the left  renal hilum. The lower pelvic ureters and the bladder are all obscured due to extensive metallic artifact from bilateral hip replacements. Stomach/Bowel: Unremarkable contracted stomach. Normal caliber small bowel without wall thickening or inflammatory change. Normal subcecal appendix. There is colonic diverticulosis without evidence of wall thickening or diverticulitis. Vascular/Lymphatic: Aortic atherosclerosis. No enlarged abdominal or pelvic lymph nodes. Reproductive: Prostate obscured by the hip replacements. Other: Small inguinal fat hernias. Small umbilical fat hernia. No incarcerated hernia. No free fluid, free air or focal inflammatory process. Pelvic phleboliths. Musculoskeletal: Chronic bilateral hip replacements. Advanced degenerative change lumbar spine. Ankylosis superior left SI joint. Acquired spinal stenosis L4-5. Review of the MIP images confirms the above findings. IMPRESSION: 1. No evidence of arterial dilatation, embolus or other acute chest CT or CTA findings. 2. Aortic and coronary artery atherosclerosis. 3. 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 4. No acute CT findings in the abdomen or pelvis. 5. Diverticulosis without evidence of diverticulitis. 6. Small umbilical and inguinal fat hernias. 7. Mild hepatic steatosis. 8. Chronic bilateral hip replacements with extensive metallic artifact obscuring the lower pelvic ureters, prostate and bladder. 9. Acquired spinal stenosis L4-5. 10. Discoid gynecomastia. Electronically Signed   By: Almira Bar M.D.   On: 05/27/2023 02:29   CT ABDOMEN PELVIS W CONTRAST Result Date: 05/27/2023 CLINICAL DATA:  Shortness of breath, sepsis, and left lower quadrant pain. EXAM: CT ANGIOGRAPHY CHEST CT ABDOMEN AND PELVIS WITH CONTRAST TECHNIQUE: Multidetector CT imaging of the chest was performed using the standard protocol during bolus administration of intravenous contrast. Multiplanar CT image reconstructions and MIPs were obtained to evaluate  the vascular anatomy. Multidetector CT imaging of the abdomen and pelvis was performed using the standard protocol during bolus administration of intravenous contrast. RADIATION DOSE REDUCTION: This exam was performed according to the departmental dose-optimization program which includes automated exposure control, adjustment of the mA and/or kV according to patient size and/or use of iterative reconstruction technique. CONTRAST:  75mL OMNIPAQUE IOHEXOL 350 MG/ML SOLN COMPARISON:  PA lateral chest today, PA chest with rib series 05/10/2023, CT chest, abdomen and pelvis with IV contrast 05/10/2023, and CTA chest 06/24/2022. FINDINGS: CTA CHEST FINDINGS Cardiovascular: The pulmonary arteries are normal caliber and do not show evidence of embolic filling defects. The cardiac size is normal. There are scattered three-vessel calcifications in the coronary arteries. There is no pericardial effusion, no venous dilatation. There is mild aortic tortuosity and atherosclerosis without aneurysm, dissection or stenosis. The great vessels are clear. Mediastinum/Nodes: No enlarged mediastinal, hilar, or axillary lymph nodes. Thyroid gland, trachea, and esophagus demonstrate no significant findings. Lungs/Pleura: Small area with chronic branching micronodular disease lateral left upper lobe, findings consistent with small airways impactions. There is increased linear scarring or atelectasis in the right lower lobe interval decreased atelectasis in the right middle lobe. Small airway impactions also chronically noted in the posterior right upper lobe left apex. There is no consolidation, effusion or pneumothorax.  Next Musculoskeletal: Multilevel thoracic spine bridging enthesopathy, degenerative discs, spondylosis, and thoracic kyphosis. No acute or other significant osseous findings. There are healed fracture deformities of some of the anterolateral left ribs. Bilateral discoid gynecomastia appears similar. There is eventration  and mild chronic elevation of the anterior right diaphragm. No chest wall mass is seen. Review of the MIP images confirms the above findings. CT ABDOMEN and PELVIS FINDINGS Hepatobiliary:  The liver is 18 cm in length mildly steatotic. Status post cholecystectomy with no biliary dilatation. Fall Pancreas: No abnormality. Spleen: No abnormality. Adrenals/Urinary Tract: There is no adrenal or renal mass enhancement. There are small Bosniak 1 cysts in the inferior pole of the kidneys but no mass enhancement of the kidneys. There is no urinary stone or obstruction. There are few renovascular calcifications at the left renal hilum. The lower pelvic ureters and the bladder are all obscured due to extensive metallic artifact from bilateral hip replacements. Stomach/Bowel: Unremarkable contracted stomach. Normal caliber small bowel without wall thickening or inflammatory change. Normal subcecal appendix. There is colonic diverticulosis without evidence of wall thickening or diverticulitis. Vascular/Lymphatic: Aortic atherosclerosis. No enlarged abdominal or pelvic lymph nodes. Reproductive: Prostate obscured by the hip replacements. Other: Small inguinal fat hernias. Small umbilical fat hernia. No incarcerated hernia. No free fluid, free air or focal inflammatory process. Pelvic phleboliths. Musculoskeletal: Chronic bilateral hip replacements. Advanced degenerative change lumbar spine. Ankylosis superior left SI joint. Acquired spinal stenosis L4-5. Review of the MIP images confirms the above findings. IMPRESSION: 1. No evidence of arterial dilatation, embolus or other acute chest CT or CTA findings. 2. Aortic and coronary artery atherosclerosis. 3. 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 4. No acute CT findings in the abdomen or pelvis. 5. Diverticulosis without evidence of diverticulitis. 6. Small umbilical and inguinal fat hernias. 7. Mild hepatic steatosis. 8. Chronic bilateral hip replacements with extensive  metallic artifact obscuring the lower pelvic ureters, prostate and bladder. 9. Acquired spinal stenosis L4-5. 10. Discoid gynecomastia. Electronically Signed   By: Almira Bar M.D.   On: 05/27/2023 02:29   DG Chest 2 View Result Date: 05/27/2023 CLINICAL DATA:  Shortness of breath, dry cough, chest pain. EXAM: CHEST - 2 VIEW COMPARISON:  Rib series and PA chest 05/10/2023 FINDINGS: The heart size and mediastinal contours are within normal limits. There is a low inspiration, with exaggerated AP chest diameter. The lungs are clear of infiltrates. The visualized skeletal structures are intact with osteopenia and thoracic kyphosis and spondylosis. IMPRESSION: Low inspiration. No evidence of acute chest disease or interval changes. Electronically Signed   By: Almira Bar M.D.   On: 05/27/2023 00:23    Scheduled Meds:    enoxaparin (LOVENOX) injection  40 mg Subcutaneous Daily   hydrOXYzine  25 mg Oral QHS   levothyroxine  175 mcg Oral QAC breakfast   memantine  10 mg Oral BID   rosuvastatin  5 mg Oral Daily    Continuous Infusions:    cefTRIAXone (ROCEPHIN)  IV Stopped (05/27/23 2254)     LOS: 1 day     Marcellus Scott, MD,  FACP, [facility], [facility], [facility]   [facility] [place]      To contact the attending provider between 7A-7P or the covering provider during after hours 7P-7A, please log into the web site [email or web address] and access using universal [place] password for that web site. If you do not have the password, please call the hospital operator.  05/28/2023, 10:28 AM

## 2023-05-29 DIAGNOSIS — B353 Tinea pedis: Secondary | ICD-10-CM

## 2023-05-29 DIAGNOSIS — F0390 Unspecified dementia without behavioral disturbance: Secondary | ICD-10-CM | POA: Diagnosis present

## 2023-05-29 DIAGNOSIS — L03115 Cellulitis of right lower limb: Secondary | ICD-10-CM | POA: Diagnosis not present

## 2023-05-29 DIAGNOSIS — F03A0 Unspecified dementia, mild, without behavioral disturbance, psychotic disturbance, mood disturbance, and anxiety: Secondary | ICD-10-CM | POA: Diagnosis not present

## 2023-05-29 DIAGNOSIS — B351 Tinea unguium: Secondary | ICD-10-CM | POA: Diagnosis not present

## 2023-05-29 DIAGNOSIS — L0390 Cellulitis, unspecified: Secondary | ICD-10-CM | POA: Diagnosis present

## 2023-05-29 LAB — CBC
HCT: 41.8 % (ref 39.0–52.0)
Hemoglobin: 14.3 g/dL (ref 13.0–17.0)
MCH: 30.6 pg (ref 26.0–34.0)
MCHC: 34.2 g/dL (ref 30.0–36.0)
MCV: 89.3 fL (ref 80.0–100.0)
Platelets: 205 10*3/uL (ref 150–400)
RBC: 4.68 MIL/uL (ref 4.22–5.81)
RDW: 12.9 % (ref 11.5–15.5)
WBC: 11.4 10*3/uL — ABNORMAL HIGH (ref 4.0–10.5)
nRBC: 0 % (ref 0.0–0.2)

## 2023-05-29 MED ORDER — TERBINAFINE HCL 250 MG PO TABS
250.0000 mg | ORAL_TABLET | Freq: Every day | ORAL | Status: DC
  Administered 2023-05-29 – 2023-05-31 (×3): 250 mg via ORAL
  Filled 2023-05-29 (×3): qty 1

## 2023-05-29 MED ORDER — CEFAZOLIN SODIUM-DEXTROSE 2-4 GM/100ML-% IV SOLN
2.0000 g | Freq: Three times a day (TID) | INTRAVENOUS | Status: DC
  Administered 2023-05-29 – 2023-05-30 (×3): 2 g via INTRAVENOUS
  Filled 2023-05-29 (×3): qty 100

## 2023-05-29 NOTE — Plan of Care (Signed)

## 2023-05-29 NOTE — Evaluation (Addendum)
 Physical Therapy Evaluation Patient Details Name: Jonathan Powers MRN: [id number] DOB: Jan 13, 1951 Today's Date: 05/29/2023  History of Present Illness  Pt is a 73 y.o. male admitted 4/5 with RLE cellulitis. PMH: HTN, hypothyroidism, hypogonadism, GERD, gout, mild neurocognitive disorder due to Alzheimer's disease, obesity, OSA  Clinical Impression  Pt admitted with above diagnosis. PTA pt lived at home with his wife, independent. Pt currently with functional limitations due to the deficits listed below (see PT Problem List). On eval, pt demo mod I bed mobility. Supervision transfers and ambulation 25' with RW. Antalgic gait. Pt will benefit from acute skilled PT to increase their independence and safety with mobility to allow discharge. PT to follow acutely. No follow up services indicated.          If plan is discharge home, recommend the following: Assist for transportation;Assistance with cooking/housework;Help with stairs or ramp for entrance   Can travel by private vehicle        Equipment Recommendations Rolling walker (2 wheels) (Pt to check to see if he has one at home.)  Recommendations for Other Services       Functional Status Assessment Patient has had a recent decline in their functional status and demonstrates the ability to make significant improvements in function in a reasonable and predictable amount of time.     Precautions / Restrictions Precautions Precautions: Fall      Mobility  Bed Mobility Overal bed mobility: Modified Independent             General bed mobility comments: +rail, HOB elevated    Transfers Overall transfer level: Needs assistance Equipment used: Rolling walker (2 wheels) Transfers: Sit to/from Stand Sit to Stand: Supervision                Ambulation/Gait Ambulation/Gait assistance: Supervision Gait Distance (Feet): 20 Feet Assistive device: Rolling walker (2 wheels) Gait Pattern/deviations: Antalgic, Step-to  pattern, Decreased stride length Gait velocity: decreased     General Gait Details: MD arrived in room requiring return to bed and limiting gait distance.  Stairs            Wheelchair Mobility     Tilt Bed    Modified Rankin (Stroke Patients Only)       Balance Overall balance assessment: Mild deficits observed, not formally tested                                           Pertinent Vitals/Pain Pain Assessment Pain Assessment: 0-10 Pain Score: 10-Worst pain ever Pain Location: RLE (6/10 at rest, 10/10 with mobility) Pain Descriptors / Indicators: Grimacing, Guarding, Discomfort Pain Intervention(s): Limited activity within patient's tolerance, Monitored during session, Repositioned    Home Living Family/patient expects to be discharged to:: Private residence Living Arrangements: Spouse/significant other Available Help at Discharge: Family Type of Home: House Home Access: Ramped entrance     Alternate Level Stairs-Number of Steps: flight Home Layout: Two level Home Equipment: None Additional Comments: Pt unsure if he still has DME from previous sx.    Prior Function                       Extremity/Trunk Assessment   Upper Extremity Assessment Upper Extremity Assessment: Overall WFL for tasks assessed    Lower Extremity Assessment Lower Extremity Assessment: RLE deficits/detail RLE Deficits / Details: limited ankle ROM due to  edema and pain    Cervical / Trunk Assessment Cervical / Trunk Assessment: Normal  Communication   Communication Communication: No apparent difficulties    Cognition Arousal: Alert Behavior During Therapy: WFL for tasks assessed/performed   PT - Cognitive impairments: History of cognitive impairments                       PT - Cognition Comments: Alzheimer's at baseline. Appropriate and oriented. Appears to have STM deficits. Following commands: Intact       Cueing       General  Comments General comments (skin integrity, edema, etc.): VSS on RA    Exercises     Assessment/Plan    PT Assessment Patient needs continued PT services  PT Problem List Pain;Decreased mobility;Decreased knowledge of use of DME;Decreased activity tolerance       PT Treatment Interventions DME instruction;Functional mobility training;Patient/family education;Gait training;Therapeutic activities;Stair training;Therapeutic exercise    PT Goals (Current goals can be found in the Care Plan section)  Acute Rehab PT Goals Patient Stated Goal: decrease pain and walk PT Goal Formulation: With patient Time For Goal Achievement: 06/12/23 Potential to Achieve Goals: Good    Frequency Min 1X/week     Co-evaluation               AM-PAC PT "6 Clicks" Mobility  Outcome Measure Help needed turning from your back to your side while in a flat bed without using bedrails?: None Help needed moving from lying on your back to sitting on the side of a flat bed without using bedrails?: None Help needed moving to and from a bed to a chair (including a wheelchair)?: A Little Help needed standing up from a chair using your arms (e.g., wheelchair or bedside chair)?: A Little Help needed to walk in hospital room?: A Little Help needed climbing 3-5 steps with a railing? : A Little 6 Click Score: 20    End of Session   Activity Tolerance: Patient limited by pain Patient left: in bed;with call bell/phone within reach Nurse Communication: Mobility status PT Visit Diagnosis: Pain;Difficulty in walking, not elsewhere classified (R26.2) Pain - Right/Left: Right Pain - part of body: Leg    Time: 1610-9604 PT Time Calculation (min) (ACUTE ONLY): 25 min   Charges:   PT Evaluation $PT Eval Low Complexity: 1 Low   PT General Charges $$ ACUTE PT VISIT: 1 Visit         Ferd Glassing., PT  Office # [phone]   Ilda Foil 05/29/2023, 11:50 AM

## 2023-05-29 NOTE — Progress Notes (Addendum)
 PROGRESS NOTE   Jonathan Powers  ZOX:[id number]    DOB: Feb 25, 1950    DOA: 05/26/2023  PCP: [name], MD   I have briefly reviewed patients previous medical records in [facility].  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  Although patient denied any trauma, insect bite or his dog licking his right lower extremity, recently seen in orthopedic office on 3/26 for right foot pain and treated for tinea pedis of the webspaces.  Admitted for right lower extremity cellulitis.   Assessment & Plan:  Active Problems:   Cellulitis   Right leg cellulitis Likely source of entry was tenia pedis with wound noted in the third right foot webspace and may be a tiny 1 even in the fourth webspace. Although patient presented with fever of 102.7 F, transient tachypnea, and leukocytosis, there was no evidence of target organ dysfunction.  Lactate was normal x 2.  Sepsis ruled out. Continue empirically started IV ceftriaxone.  Blood cultures x 2: Negative to date. Influenza/RSV/COVID-19 testing negative. CTA chest PE protocol, CT abdomen and pelvis with contrast: No PE and no acute abnormalities RLE venous Doppler negative for DVT. Elevate RLE.  Multimodality pain control. Patient counseled regarding importance of keeping his feet/webspaces clean and dry and monitoring closely at home. After 3 days of IV Ceftriaxone, patient has defervesced, Leokocytosis has resolved but R leg still with residual significant swelling, tenderness, warmth and redness (has somewhat improved since admission).  MRSA screen negative.  ID consulted for  assistance.  Addendum ID input appreciated.  Communicated with ID They suspect strep cellulitis.  Ceftriaxone has been narrowed to IV cefazolin with initial plans to change to cefadroxil.  However daughter reports that patient developed a rash and pruritus when he used cefadroxil a month ago.  Updated ID team who will reassess in a.m. regarding choice of oral antibiotic.  They are also considering suppressive prolonged antibiotics given recurrent cellulitis.  Lamisil has been added for fungal infection.  Elevated troponin suspected due to demand ischemia Troponin mildly elevated in the 90s with a flat trend, then down to 82.  No chest pain and EKG without acute changes CTA chest negative for pulmonary embolism. 2D echo with preserved LVEF.  ?  Chronic LUQ abdominal pain CT A/P with contrast without acute findings. Outpatient follow-up.  Has not complained of abdominal pain since being here to me.  Hypothyroidism Continue home dose of levothyroxine  Hyperlipidemia Continue statins  Essential hypertension Unclear if taking Benicar 5 Mg daily or not.  Controlled off of Benicar currently.  Neurocognitive disorder due to Alzheimer's disease Patient appears to be sometimes mildly confused with inappropriate responses but do not know his baseline. Monitor. As per discussion with patient's daughter/HCPOA, patient has short term memory impairment.  Also more confused here than his baseline.  Hypogonadism  Body mass index is 38.75 kg/m./Obesity Complicates care  DVT prophylaxis: enoxaparin (LOVENOX) injection 40 mg Start: 05/27/23 1000     Code Status: Limited: Do not attempt resuscitation (DNR) -DNR-LIMITED -Do Not Intubate/DNI :  Family Communication: Discussed in detail with patient's daughter via phone, updated care and answered all questions. Disposition:  Status is: Inpatient Remains inpatient  appropriate because: IV antibiotics     Consultants:     Procedures:      Antimicrobials:   IV ceftriaxone   Subjective:  Ongoing right leg pain.  No other complaints reported.  Objective:   Vitals:   05/28/23 1230 05/28/23 2024 05/29/23 0011 05/29/23 0437  BP: 115/70 126/62 107/61 (!) 110/51  Pulse: 61 63 60 61  Resp: 18 18 18 18   Temp: 98.7 F (37.1 C) 98 F (36.7 C) 97.8 F (36.6 C) 98.5 F (36.9 C)  TempSrc:  Oral Oral Oral  SpO2: 96% 99% 99% 95%  Weight:      Height:        General exam: Elderly male, moderately built and obese lying comfortably propped up in bed without distress.  Looks better than he did yesterday.  Appears to be in good spirits. Respiratory system: Clear to auscultation. Respiratory effort normal. Cardiovascular system: S1 & S2 heard, RRR. No JVD, murmurs, rubs, gallops or clicks. No pedal edema.   Gastrointestinal system: Abdomen is nondistended, soft and nontender. No organomegaly or masses felt. Normal bowel sounds heard. Central nervous system: Alert and oriented x 2 but appeared.  Answers appropriately and does not appear confused today. No focal neurological deficits. Extremities: Symmetric 5 x 5 power. Skin: Right leg below-knee with mild swelling, increased warmth, tenderness and patchy redness, all may have improved only slightly in the last 48 hours.  No crepitus or fluctuance. Has macerated skin with small crack in the third webspace of right foot and may be a smaller one even on the fourth webspace-better than on admission.  Picture below from 4/7. Psychiatry: Judgement and insight appear somewhat impaired. Mood & affect appropriate.        Data Reviewed:   I have personally reviewed following labs and imaging studies   CBC: Recent Labs  Lab 05/27/23 0005 05/27/23 0836 05/28/23 0646 05/29/23 0554  WBC 19.2* 15.3* 15.7* 11.4*  NEUTROABS 17.5*  --   --   --   HGB 16.4 14.8 15.7 14.3  HCT 47.8 42.4 46.2 41.8  MCV 89.5 88.9 90.2 89.3  PLT 260 225 230 205    Basic Metabolic Panel: Recent Labs   Lab 05/27/23 0005 05/27/23 0836 05/28/23 0646  NA 135  --  136  K 3.9  --  3.8  CL 101  --  101  CO2 22  --  24  GLUCOSE 152*  --  127*  BUN 16  --  14  CREATININE 1.17 1.05 1.10  CALCIUM 9.2  --  9.2    Liver Function Tests: Recent Labs  Lab 05/27/23 0005 05/28/23 0646  AST 25 28  ALT 21 23  ALKPHOS 44 54  BILITOT 1.8* 1.5*  PROT 7.2 7.0  ALBUMIN 3.8 3.3*    CBG: No results for input(s): "GLUCAP" in the last 168 hours.  Microbiology Studies:   Recent Results (from the past 240 hours)  Culture, blood (Routine x 2)     Status: None (Preliminary result)   Collection Time: 05/26/23 11:45 PM   Specimen: BLOOD RIGHT ARM  Result Value Ref Range Status   Specimen Description BLOOD RIGHT ARM  Final   Special Requests   Final    BOTTLES DRAWN AEROBIC AND ANAEROBIC Blood Culture results may not be optimal due to an inadequate volume of blood received in culture bottles   Culture   Final    NO GROWTH 2 DAYS Performed at [facility], [address], [place], [place] [address]    Report Status PENDING  Incomplete  Culture, blood (Routine x 2)     Status: None (Preliminary result)   Collection Time: 05/27/23 12:05 AM   Specimen: BLOOD RIGHT HAND  Result Value Ref Range Status   Specimen Description BLOOD RIGHT HAND  Final   Special Requests   Final    BOTTLES DRAWN AEROBIC ONLY Blood Culture results may not be optimal due to an inadequate volume of blood received in culture bottles   Culture   Final    NO GROWTH 2 DAYS Performed at [facility], [address], [place], [place] [address]    Report Status PENDING  Incomplete  Resp panel by RT-PCR (RSV, Flu A&B, Covid) Anterior Nasal Swab     Status: None   Collection Time: 05/27/23  1:15 AM   Specimen: Anterior Nasal Swab  Result Value Ref Range Status   SARS Coronavirus 2 by RT PCR NEGATIVE NEGATIVE Final   Influenza A by PCR NEGATIVE NEGATIVE Final   Influenza B by PCR NEGATIVE NEGATIVE Final     Comment: (NOTE) The Xpert Xpress SARS-CoV-2/FLU/RSV plus assay is intended as an aid in the diagnosis of influenza from Nasopharyngeal swab specimens and should not be used as a sole basis for treatment. Nasal washings and aspirates are unacceptable for Xpert Xpress SARS-CoV-2/FLU/RSV testing.  Fact Sheet for Patients: BloggerCourse.com  Fact Sheet for Healthcare Providers: SeriousBroker.it  This test is not yet approved or cleared by the [place] FDA and has been authorized for detection and/or diagnosis of SARS-CoV-2 by FDA under an Emergency Use Authorization (EUA). This EUA will remain in effect (meaning this test can be used) for the duration of the COVID-19 declaration under Section 564(b)(1) of the Act, 21 U.S.C. section 360bbb-3(b)(1), unless the authorization is terminated or revoked.     Resp Syncytial Virus by PCR NEGATIVE NEGATIVE Final    Comment: (NOTE) Fact Sheet for Patients: BloggerCourse.com  Fact Sheet for Healthcare Providers: SeriousBroker.it  This test is not yet approved or cleared by the [place] FDA and has been authorized for detection and/or diagnosis of SARS-CoV-2 by FDA under an Emergency Use Authorization (EUA). This EUA will remain in effect (meaning this test can be used) for the duration of the COVID-19 declaration under Section 564(b)(1) of the Act, 21 U.S.C. section 360bbb-3(b)(1), unless the authorization is terminated or revoked.  Performed at [facility], [address], [place], [place] [address]   MRSA Next Gen by PCR, Nasal     Status: None   Collection Time: 05/28/23 10:34 AM   Specimen: Nasal Mucosa; Nasal Swab  Result Value Ref Range Status   MRSA by PCR Next Gen NOT DETECTED NOT DETECTED Final    Comment: (NOTE) The GeneXpert MRSA Assay (FDA approved for NASAL specimens only), is one component of a comprehensive  MRSA colonization surveillance program. It is not intended to diagnose MRSA infection nor to guide or monitor treatment for MRSA infections. Test performance is not FDA approved in patients less than 45 years old. Performed at [facility] Lab, [address], [place], [place] [address]     Radiology Studies:  VAS [place] LOWER EXTREMITY VENOUS (DVT) Result Date: 05/28/2023  Lower Venous DVT Study Patient Name:  Jonathan Powers  Date of Exam:   05/27/2023 Medical Rec #: [id number]         Accession #:    [id number] Date of Birth: 1951/02/13  Patient Gender: M Patient Age:   8 years Exam Location:  [facility] Procedure:      VAS [place] LOWER EXTREMITY VENOUS (DVT) Referring Phys: CAROLE HALL --------------------------------------------------------------------------------  Indications: Pain, Swelling, Erythema, and Cellulitis.  Limitations: Edema, pain with compression maneuver and body habitus. Comparison Study: No prior right LEV on file Performing Technologist: Sherren Kerns RVS  Examination Guidelines: A complete evaluation includes B-mode imaging, spectral Doppler, color Doppler, and power Doppler as needed of all accessible portions of each vessel. Bilateral testing is considered an integral part of a complete examination. Limited examinations for reoccurring indications may be performed as noted. The reflux portion of the exam is performed with the patient in reverse Trendelenburg.  +---------+---------------+---------+-----------+----------+-------------------+ RIGHT    CompressibilityPhasicitySpontaneityPropertiesThrombus Aging      +---------+---------------+---------+-----------+----------+-------------------+ CFV      Full           Yes      Yes                                      +---------+---------------+---------+-----------+----------+-------------------+ SFJ      Full                                                              +---------+---------------+---------+-----------+----------+-------------------+ FV Prox  Full                                                             +---------+---------------+---------+-----------+----------+-------------------+ FV Mid   Full                                                             +---------+---------------+---------+-----------+----------+-------------------+ FV DistalFull                                                             +---------+---------------+---------+-----------+----------+-------------------+ PFV      Full                                                             +---------+---------------+---------+-----------+----------+-------------------+ POP      Full           Yes      Yes                                      +---------+---------------+---------+-----------+----------+-------------------+ PTV  patent by color     +---------+---------------+---------+-----------+----------+-------------------+ PERO                                                  Not well visualized +---------+---------------+---------+-----------+----------+-------------------+   +----+---------------+---------+-----------+----------+--------------+ LEFTCompressibilityPhasicitySpontaneityPropertiesThrombus Aging +----+---------------+---------+-----------+----------+--------------+ CFV Full           Yes      Yes                                 +----+---------------+---------+-----------+----------+--------------+    Summary: RIGHT: - There is no evidence of deep vein thrombosis in the lower extremity. However, portions of this examination were limited- see technologist comments above.  - No cystic structure found in the popliteal fossa. - Ultrasound characteristics of enlarged lymph nodes are noted in the groin.  LEFT: - No evidence of common femoral vein obstruction.   *See table(s)  above for measurements and observations. Electronically signed by Coral Else MD on 05/28/2023 at 1:30:29 PM.    Final    ECHOCARDIOGRAM COMPLETE Result Date: 05/27/2023    ECHOCARDIOGRAM REPORT   Patient Name:   Jonathan Powers Date of Exam: 05/27/2023 Medical Rec #:  [id number]        Height:       70.0 in Accession #:    [id number]       Weight:       270.1 lb Date of Birth:  24-Oct-1950        BSA:          2.372 m Patient Age:    72 years         BP:           104/61 mmHg Patient Gender: M                HR:           76 bpm. Exam Location:  Inpatient Procedure: 2D Echo, Cardiac Doppler, Color Doppler and Strain Analysis (Both            Spectral and Color Flow Doppler were utilized during procedure). Indications:    Elevated Troponin  History:        Patient has prior history of Echocardiogram examinations, most                 recent 12/27/2018. Risk Factors:Hypertension, GERD and Sleep                 Apnea. Hypothyroidism.  Sonographer:    Raeford Razor RDCS Referring Phys: [id number] CAROLE N HALL IMPRESSIONS  1. Left ventricular ejection fraction, by estimation, is 60 to 65%. The left ventricle has normal function. The left ventricle has no regional wall motion abnormalities. There is moderate left ventricular hypertrophy. Left ventricular diastolic parameters are consistent with Grade I diastolic dysfunction (impaired relaxation). The average left ventricular global longitudinal strain is -21.3 %. The global longitudinal strain is normal.  2. Right ventricular systolic function is normal. The right ventricular size is normal. Tricuspid regurgitation signal is inadequate for assessing PA pressure.  3. The mitral valve is grossly normal. Trivial mitral valve regurgitation.  4. The aortic valve is tricuspid. Aortic valve regurgitation is not visualized.  5. Aortic dilatation noted. There is borderline dilatation of the ascending aorta, measuring 38 mm.  6. The  inferior vena cava is dilated in size with >50%  respiratory variability, suggesting right atrial pressure of 8 mmHg. FINDINGS  Left Ventricle: Left ventricular ejection fraction, by estimation, is 60 to 65%. The left ventricle has normal function. The left ventricle has no regional wall motion abnormalities. The average left ventricular global longitudinal strain is -21.3 %. Strain was performed and the global longitudinal strain is normal. The left ventricular internal cavity size was normal in size. There is moderate left ventricular hypertrophy. Left ventricular diastolic parameters are consistent with Grade I diastolic dysfunction (impaired relaxation). Indeterminate filling pressures. Right Ventricle: The right ventricular size is normal. No increase in right ventricular wall thickness. Right ventricular systolic function is normal. Tricuspid regurgitation signal is inadequate for assessing PA pressure. Left Atrium: Left atrial size was normal in size. Right Atrium: Right atrial size was normal in size. Pericardium: There is no evidence of pericardial effusion. Mitral Valve: The mitral valve is grossly normal. Trivial mitral valve regurgitation. Tricuspid Valve: The tricuspid valve is normal in structure. Tricuspid valve regurgitation is not demonstrated. Aortic Valve: The aortic valve is tricuspid. Aortic valve regurgitation is not visualized. Aortic valve mean gradient measures 7.0 mmHg. Aortic valve peak gradient measures 12.1 mmHg. Aortic valve area, by VTI measures 3.73 cm. Pulmonic Valve: The pulmonic valve was normal in structure. Pulmonic valve regurgitation is not visualized. Aorta: Aortic dilatation noted. There is borderline dilatation of the ascending aorta, measuring 38 mm. Venous: The inferior vena cava is dilated in size with greater than 50% respiratory variability, suggesting right atrial pressure of 8 mmHg. IAS/Shunts: No atrial level shunt detected by color flow Doppler.  LEFT VENTRICLE PLAX 2D LVIDd:         4.30 cm   Diastology LVIDs:          3.30 cm   LV e' medial:    6.13 cm/s LV PW:         1.10 cm   LV E/e' medial:  9.9 LV IVS:        1.30 cm   LV e' lateral:   8.16 cm/s LVOT diam:     2.20 cm   LV E/e' lateral: 7.4 LV SV:         120 LV SV Index:   50        2D Longitudinal Strain LVOT Area:     3.80 cm  2D Strain GLS (A4C):   -21.2 %                          2D Strain GLS (A3C):   -22.0 %                          2D Strain GLS (A2C):   -20.8 %                          2D Strain GLS Avg:     -21.3 % RIGHT VENTRICLE             IVC RV Basal diam:  4.50 cm     IVC diam: 2.20 cm RV Mid diam:    2.20 cm RV S prime:     17.40 cm/s TAPSE (M-mode): 2.7 cm LEFT ATRIUM             Index        RIGHT ATRIUM  Index LA diam:        4.10 cm 1.73 cm/m   RA Area:     16.10 cm LA Vol (A2C):   82.1 ml 34.62 ml/m  RA Volume:   43.20 ml  18.22 ml/m LA Vol (A4C):   47.4 ml 19.99 ml/m LA Biplane Vol: 62.2 ml 26.23 ml/m  AORTIC VALVE AV Area (Vmax):    3.47 cm AV Area (Vmean):   3.41 cm AV Area (VTI):     3.73 cm AV Vmax:           174.00 cm/s AV Vmean:          118.000 cm/s AV VTI:            0.321 m AV Peak Grad:      12.1 mmHg AV Mean Grad:      7.0 mmHg LVOT Vmax:         159.00 cm/s LVOT Vmean:        106.000 cm/s LVOT VTI:          0.315 m LVOT/AV VTI ratio: 0.98  AORTA Ao Root diam: 3.40 cm Ao Asc diam:  3.60 cm MITRAL VALVE MV Area (PHT): 2.87 cm    SHUNTS MV Decel Time: 264 msec    Systemic VTI:  0.32 m MV E velocity: 60.40 cm/s  Systemic Diam: 2.20 cm MV A velocity: 72.70 cm/s MV E/A ratio:  0.83 Zoila Shutter MD Electronically signed by Zoila Shutter MD Signature Date/Time: 05/27/2023/5:03:43 PM    Final     Scheduled Meds:    enoxaparin (LOVENOX) injection  40 mg Subcutaneous Daily   hydrOXYzine  25 mg Oral QHS   levothyroxine  175 mcg Oral QAC breakfast   memantine  10 mg Oral BID   rosuvastatin  5 mg Oral Daily    Continuous Infusions:    cefTRIAXone (ROCEPHIN)  IV 1 g (05/28/23 2334)     LOS: 2 days     Marcellus Scott, MD,  FACP, [facility], [facility], [facility]   [facility]       To contact the attending provider between 7A-7P or the covering provider during after hours 7P-7A, please log into the web site [email or web address] and access using universal  password for that web site. If you do not have the password, please call the hospital operator.  05/29/2023, 10:58 AM

## 2023-05-29 NOTE — Consult Note (Signed)
 Date of Admission:  05/26/2023          Reason for Consult: Recurrent nonpurulent cellulitis    Referring Provider: Marcellus Scott,  MD   Assessment:  Recurrent nonpurulent cellulitis now on the right side with several episodes previously in the left Onychomycosis and tinea pedis Neurocognitive issues after trauma and also potentially with some Alzheimer's type dementia per chart Hyperlipidemia  Plan:  Narrowed to cefazolin Elevate leg Follow-up blood cultures Finished course with oral cefadroxil and would propose having him on chronic suppressive antibiotics given his multiple episodes of cellulitis and given the fact that the strategy of reactive antibiotics may not be effective with him   Active Problems:   Cellulitis   Scheduled Meds:  enoxaparin (LOVENOX) injection  40 mg Subcutaneous Daily   hydrOXYzine  25 mg Oral QHS   levothyroxine  175 mcg Oral QAC breakfast   memantine  10 mg Oral BID   rosuvastatin  5 mg Oral Daily   Continuous Infusions:  cefTRIAXone (ROCEPHIN)  IV 1 g (05/28/23 2334)   PRN Meds:.acetaminophen, melatonin, oxyCODONE, polyethylene glycol, prochlorperazine  HPI: Jonathan Powers is a 73 y.o. 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  He was last seen by Jonathan Powers in clinic and treated with cefadroxil after he had received dalbavancin infusion.  He was also given a prescription of cefadroxil to have on hand to initiate the first signs of cellulitis.  Follow-up with the fairly abrupt onset of intense pain and erythema in edema now on the opposite leg.  I question whether Jonathan Powers had started his oral antibiotics but he was not certain if he had I do not know if potentially his  neurocognitive issues are playing a role he says that he had a head injury and I see the Alzheimer's is also listed in his problem list.  In any case by the time he arrived in the ER he was tachypneic and febrile to over 102.7.  Blood cultures were taken and he was initiated on IV antibiotics form of ceftriaxone.  His clinical picture is improving with his fever having defervesced and white count nearly normalized he still has significant mount of pain and edema but the erythema seems stable.  This is yet another episode of nonpurulent cellulitis and I think we can narrow down to cefazolin.  I wonder with Jonathan Powers's neurocognitive challenges he has been having if it may not be better just to simply have him on an oral beta-lactam prophylactically to prevent cellulitis.  He also does have onychomycosis bilaterally and some tinea likely is also putting him at risk   I have personally spent 82 minutes involved in face-to-face and non-face-to-face activities for this patient on the day of the visit. Professional time spent includes the following activities: Preparing to see the patient (review of tests), Obtaining and/or reviewing separately obtained history (admission/discharge record), Performing a medically appropriate examination and/or evaluation , Ordering medications/tests/procedures, referring and communicating with other health care professionals, Documenting clinical information in the EMR, Independently interpreting results (not separately reported), Communicating results to the patient/family/caregiver, Counseling and educating the patient/family/caregiver and Care coordination (not separately reported).   Evaluation of the patient requires complex antimicrobial therapy evaluation, counseling , isolation needs to reduce disease  transmission and risk assessment and mitigation.     Review of Systems: Review of Systems  Constitutional:  Positive for fever. Negative for chills, diaphoresis,  malaise/fatigue and weight loss.  HENT:  Negative for congestion, hearing loss, sore throat and tinnitus.   Eyes:  Negative for blurred vision and double vision.  Respiratory:  Negative for cough, sputum production, shortness of breath and wheezing.   Cardiovascular:  Negative for chest pain, palpitations and leg swelling.  Gastrointestinal:  Negative for abdominal pain, blood in stool, constipation, diarrhea, heartburn, melena, nausea and vomiting.  Genitourinary:  Negative for dysuria, flank pain and hematuria.  Musculoskeletal:  Positive for myalgias. Negative for back pain, falls and joint pain.  Skin:  Negative for itching and rash.  Neurological:  Negative for dizziness, sensory change, focal weakness, loss of consciousness, weakness and headaches.  Endo/Heme/Allergies:  Does not bruise/bleed easily.  Psychiatric/Behavioral:  Positive for memory loss. Negative for depression and suicidal ideas. The patient is not nervous/anxious.     Past Medical History:  Diagnosis Date   Acute recurrent pansinusitis 02/17/2021   Age related osteoporosis 03/11/2018   Allergic rhinitis 05/17/2006   BPH (benign prostatic hyperplasia) 09/06/2012   Cellulitis of left lower extremity 11/10/2018   Cellulitis, right leg    Recurrent R hip cellulitis 1/08,3/08    Chronic fatigue, unspecified    Chronic hyperglycemia 06/29/2020   Chronic right-sided low back pain without sciatica 07/21/2016   Chronic sinusitis 07/10/2021   Deviated septum 02/17/2021   Diverticulitis    Essential hypertension 05/17/2006   2014     Impression  Exercise Capacity:  Lexiscan with low level exercise.  BP Response:  Normal blood pressure response.  Clinical Symptoms:  No significant symptoms noted.  ECG Impression:  No significant ST segment change suggestive of ischemia.  Comparison with Prior Nuclear Study: No images to compare     Overall Impression:  Low risk stress nuclear study There is mild apical thinning but no     Exocrine pancreatic insufficiency 12/03/2021   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   Flexural eczema 07/24/2017   Generalized abdominal tenderness with rebound tenderness 08/02/2022   GERD (gastroesophageal reflux disease)    Gout    Hashimoto's thyroiditis    History of skin cancer    Melanoma on back   Hypogonadism male    low T, a/w ED   Hypothyroidism 11/11/2018   Idiopathic urticaria    Insomnia 11/10/2018   Left flank pain 08/02/2022   Left thyroid nodule 2008   on [place], decrease size in 2011 [place]   Lung nodule 07/25/2021   Migraines    Atypical and ocular   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 No acute findings.  2. 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  3. Ectatic abdominal aorta at risk for aneurysm development.  Recommend followup by ultrasound in 5 years. This recommendation  follows ACR consensus guidelines:  White Paper of the ACR Incidental  Findings Committee II on Vascular Findings. J Am Coll Radiol 2013;  10:789-794.  4. 1.   Symptomatic PVCs 09/06/2012    Social History   Tobacco Use   Smoking status: Never   Smokeless tobacco: Never  Vaping Use   Vaping status: Never Used  Substance Use Topics   Alcohol use: Not Currently    Comment: rare   Drug use: No    Family History  Problem Relation Age of Onset   Arthritis Father    Heart disease Father    Diabetes Father    Vascular Disease  Father        AoBifem   Arthritis Mother    Diabetes Mother    Multiple sclerosis Mother    Cancer Paternal Grandmother        "GI"   Cancer Paternal Grandfather        stomach cancer   Thyroid cancer Sister    Uterine cancer Sister    Allergies  Allergen Reactions   Nsaids Shortness Of Breath    Naproxen specifically causes SOB/wheeze tolerates topical diclofenac without problem Tylenol OK   Tolmetin Shortness Of Breath    Naproxen specifically causes SOB/wheeze tolerates topical diclofenac without problem Tylenol OK    OBJECTIVE: Blood pressure (!) 110/51, pulse 61, temperature 98.5 F (36.9 C), temperature source Oral, resp. rate 18, height 5\' 10"  (1.778 m), weight 122.5 kg, SpO2 95%.  Physical Exam Constitutional:      Appearance: He is well-developed.  HENT:     Head: Normocephalic and atraumatic.  Eyes:     Conjunctiva/sclera: Conjunctivae normal.  Cardiovascular:     Rate and Rhythm: Normal rate and regular rhythm.  Pulmonary:     Effort: Pulmonary effort is normal. No respiratory distress.     Breath sounds: No wheezing.  Abdominal:     General: There is no distension.     Palpations: Abdomen is soft.  Musculoskeletal:        General: No tenderness. Normal range of motion.     Cervical back: Normal range of motion and neck supple.  Skin:    General: Skin is warm and dry.     Coloration: Skin is not pale.     Findings: No erythema or rash.  Neurological:     General: No focal deficit present.     Mental Status: He is alert and oriented to person, place, and time.  Psychiatric:        Mood and Affect: Mood normal.        Behavior: Behavior normal.        Thought Content: Thought content normal.        Judgment: Judgment normal.      Right lower extremity 05/29/2023:       Area of maceration between toes see below    Lab Results Lab Results  Component Value Date   WBC 11.4 (H) 05/29/2023   HGB 14.3 05/29/2023   HCT 41.8 05/29/2023    MCV 89.3 05/29/2023   PLT 205 05/29/2023    Lab Results  Component Value Date   CREATININE 1.10 05/28/2023   BUN 14 05/28/2023   NA 136 05/28/2023   K 3.8 05/28/2023   CL 101 05/28/2023   CO2 24 05/28/2023    Lab Results  Component Value Date   ALT 23 05/28/2023   AST 28 05/28/2023   ALKPHOS 54 05/28/2023   BILITOT 1.5 (H) 05/28/2023  Microbiology: Recent Results (from the past 240 hours)  Culture, blood (Routine x 2)     Status: None (Preliminary result)   Collection Time: 05/26/23 11:45 PM   Specimen: BLOOD RIGHT ARM  Result Value Ref Range Status   Specimen Description BLOOD RIGHT ARM  Final   Special Requests   Final    BOTTLES DRAWN AEROBIC AND ANAEROBIC Blood Culture results may not be optimal due to an inadequate volume of blood received in culture bottles   Culture   Final    NO GROWTH 2 DAYS Performed at [facility], [address] [place], [place] [address]    Report Status PENDING  Incomplete  Culture, blood (Routine x 2)     Status: None (Preliminary result)   Collection Time: 05/27/23 12:05 AM   Specimen: BLOOD RIGHT HAND  Result Value Ref Range Status   Specimen Description BLOOD RIGHT HAND  Final   Special Requests   Final    BOTTLES DRAWN AEROBIC ONLY Blood Culture results may not be optimal due to an inadequate volume of blood received in culture bottles   Culture   Final    NO GROWTH 2 DAYS Performed at [facility], [address], [place], [place] [address]    Report Status PENDING  Incomplete  Resp panel by RT-PCR (RSV, Flu A&B, Covid) Anterior Nasal Swab     Status: None   Collection Time: 05/27/23  1:15 AM   Specimen: Anterior Nasal Swab  Result Value Ref Range Status   SARS Coronavirus 2 by RT PCR NEGATIVE NEGATIVE Final   Influenza A by PCR NEGATIVE NEGATIVE Final   Influenza B by PCR NEGATIVE NEGATIVE Final    Comment: (NOTE) The Xpert Xpress SARS-CoV-2/FLU/RSV plus assay is intended as an aid in the diagnosis of  influenza from Nasopharyngeal swab specimens and should not be used as a sole basis for treatment. Nasal washings and aspirates are unacceptable for Xpert Xpress SARS-CoV-2/FLU/RSV testing.  Fact Sheet for Patients: BloggerCourse.com  Fact Sheet for Healthcare Providers: SeriousBroker.it  This test is not yet approved or cleared by the [place] FDA and has been authorized for detection and/or diagnosis of SARS-CoV-2 by FDA under an Emergency Use Authorization (EUA). This EUA will remain in effect (meaning this test can be used) for the duration of the COVID-19 declaration under Section 564(b)(1) of the Act, 21 U.S.C. section 360bbb-3(b)(1), unless the authorization is terminated or revoked.     Resp Syncytial Virus by PCR NEGATIVE NEGATIVE Final    Comment: (NOTE) Fact Sheet for Patients: BloggerCourse.com  Fact Sheet for Healthcare Providers: SeriousBroker.it  This test is not yet approved or cleared by the [place] FDA and has been authorized for detection and/or diagnosis of SARS-CoV-2 by FDA under an Emergency Use Authorization (EUA). This EUA will remain in effect (meaning this test can be used) for the duration of the COVID-19 declaration under Section 564(b)(1) of the Act, 21 U.S.C. section 360bbb-3(b)(1), unless the authorization is terminated or revoked.  Performed at [facility], [address] [place], [place] [address]   MRSA Next Gen by PCR, Nasal     Status: None   Collection Time: 05/28/23 10:34 AM   Specimen: Nasal Mucosa; Nasal Swab  Result Value Ref Range Status   MRSA by PCR Next Gen NOT DETECTED NOT DETECTED Final    Comment: (NOTE) The GeneXpert MRSA Assay (FDA approved for NASAL specimens only), is one component of a comprehensive MRSA  colonization surveillance program. It is not intended to diagnose MRSA infection nor to guide or  monitor treatment for MRSA infections. Test performance is not FDA approved in patients less than 17 years old. Performed at [facility], [address] [place], [place] [address]     Acey Lav, MD [facility] for Infectious Disease [facility] [phone] pager  05/29/2023, 11:38 AM

## 2023-05-30 DIAGNOSIS — L03115 Cellulitis of right lower limb: Secondary | ICD-10-CM | POA: Diagnosis not present

## 2023-05-30 DIAGNOSIS — L0390 Cellulitis, unspecified: Secondary | ICD-10-CM | POA: Diagnosis not present

## 2023-05-30 MED ORDER — CEPHALEXIN 500 MG PO CAPS
500.0000 mg | ORAL_CAPSULE | Freq: Four times a day (QID) | ORAL | Status: DC
Start: 2023-05-30 — End: 2023-05-31
  Administered 2023-05-30 – 2023-05-31 (×5): 500 mg via ORAL
  Filled 2023-05-30 (×5): qty 1

## 2023-05-30 NOTE — Care Management Important Message (Signed)
 Important Message  Patient Details  Name: Jonathan Powers MRN: [id number] Date of Birth: [birth year]   Important Message Given:  Yes - Medicare IM     Dorena Bodo 05/30/2023, 2:41 PM

## 2023-05-30 NOTE — Progress Notes (Signed)
 Patient seen walking into another patient's room. Staff nurse assisted patient back to his room and set bed alarm. Patient's nephew called unit using profanity and stating that "he was told that the nurse who assisted the patient back to his bed was imprisoning his uncle and needed a lesson in ethics and that she had no fing right to imprison his uncle." I let the nephew know that I witnessed the exchange and for patient safety we cannot allow him to enter other patient's rooms. At no point was the patient's closed. That he does have a bed alarm due to him being a high falls risk. Consulting civil engineer notified. Patient called this nurse into the room to state he refused to be imprisoned and would like to leave AMA. Charge nurse and MD notified. Will continue to monitor

## 2023-05-30 NOTE — Plan of Care (Signed)

## 2023-05-30 NOTE — Progress Notes (Signed)
 Any medical update, please call daughter on file Hurd,M.

## 2023-05-30 NOTE — Progress Notes (Signed)
 Physical Therapy Treatment Patient Details Name: Jonathan Powers MRN: [id number] DOB: September 11, 1950 Today's Date: 05/30/2023   History of Present Illness Pt is a 73 y.o. male admitted 4/5 with RLE cellulitis. PMH: HTN, hypothyroidism, hypogonadism, GERD, gout, mild neurocognitive disorder due to Alzheimer's disease, obesity, OSA    PT Comments  Pt received in supine, pleasantly agreeable to therapy session and with good participation and tolerance for transfer, gait and stair training. Pt performed stair negotiation with up to CGA for safety, no buckling or overt LOB. Pt Supervision at most for gait with RW for improved body mechanics/AD use. Pt continues to benefit from PT services to progress toward functional mobility goals.    If plan is discharge home, recommend the following: Assist for transportation;Assistance with cooking/housework;Help with stairs or ramp for entrance   Can travel by private vehicle        Equipment Recommendations  Rolling walker (2 wheels) (pt to update case mgr if he has 2WW at home already)    Recommendations for Other Services       Precautions / Restrictions Precautions Precautions: Fall Recall of Precautions/Restrictions: Intact Restrictions Weight Bearing Restrictions Per Provider Order: No     Mobility  Bed Mobility Overal bed mobility: Modified Independent             General bed mobility comments: +rail, HOB elevated    Transfers Overall transfer level: Needs assistance Equipment used: Rolling walker (2 wheels) Transfers: Sit to/from Stand Sit to Stand: Modified independent (Device/Increase time)           General transfer comment: no assist needed; EOB<>RW    Ambulation/Gait Ambulation/Gait assistance: Supervision Gait Distance (Feet): 125 Feet Assistive device: Rolling walker (2 wheels) Gait Pattern/deviations: Antalgic, Step-to pattern, Decreased stride length Gait velocity: decreased     General Gait Details: Pt  appropriately requesting return to room as pain increased in limb; min cues for improved proximity within RW to better use UE to offload RLE pain.   Stairs Stairs: Yes Stairs assistance: Contact guard assist Stair Management: Step to pattern, Two rails, Backwards, Forwards (RW to simulate bil rails) Number of Stairs: 5 General stair comments: cues for sequencing for reduced pain and safety, no buckling or LOB, CGA for safety   Wheelchair Mobility     Tilt Bed    Modified Rankin (Stroke Patients Only)       Balance Overall balance assessment: Mild deficits observed, not formally tested                                          Communication Communication Communication: No apparent difficulties  Cognition Arousal: Alert Behavior During Therapy: WFL for tasks assessed/performed   PT - Cognitive impairments: History of cognitive impairments                       PT - Cognition Comments: Alzheimer's at baseline. Appropriate and oriented. Spouse present and asking some questions re: activity recs per home set-up. Following commands: Intact      Cueing Cueing Techniques: Verbal cues  Exercises General Exercises - Lower Extremity Ankle Circles/Pumps: AROM, Right, 5 reps, Supine (encouraged him to perform hourly)    General Comments General comments (skin integrity, edema, etc.): RLE distal limb dark red, esp on posterior aspect; pedal edema, reinforced wtih pt and spouse need to keep RLE elevated with foot over hip.  Pertinent Vitals/Pain Pain Assessment Pain Assessment: 0-10 Pain Score: 5  Pain Location: RLE during ambulation Pain Descriptors / Indicators: Guarding, Discomfort Pain Intervention(s): Monitored during session, Repositioned    Home Living                          Prior Function            PT Goals (current goals can now be found in the care plan section) Acute Rehab PT Goals Patient Stated Goal: decrease  pain and walk PT Goal Formulation: With patient Time For Goal Achievement: 06/12/23 Progress towards PT goals: Progressing toward goals    Frequency    Min 1X/week      PT Plan      Co-evaluation              AM-PAC PT "6 Clicks" Mobility   Outcome Measure  Help needed turning from your back to your side while in a flat bed without using bedrails?: None Help needed moving from lying on your back to sitting on the side of a flat bed without using bedrails?: None Help needed moving to and from a bed to a chair (including a wheelchair)?: A Little Help needed standing up from a chair using your arms (e.g., wheelchair or bedside chair)?: None Help needed to walk in hospital room?: A Little Help needed climbing 3-5 steps with a railing? : A Little 6 Click Score: 21    End of Session Equipment Utilized During Treatment: Gait belt Activity Tolerance: Patient tolerated treatment well Patient left: in bed;with call bell/phone within reach;with family/visitor present (spouse in room, pt requesting to sit EOB for a short time, PTA encouraging him to elevate his RLE once returned to supine) Nurse Communication: Mobility status;Other (comment) (needs bed alarm placed when spouse leaves for pt safety) PT Visit Diagnosis: Pain;Difficulty in walking, not elsewhere classified (R26.2) Pain - Right/Left: Right Pain - part of body: Leg     Time: 1308-6578 PT Time Calculation (min) (ACUTE ONLY): 17 min  Charges:    $Gait Training: 8-22 mins PT General Charges $$ ACUTE PT VISIT: 1 Visit                     Raheel Kunkle P., PTA Acute Rehabilitation Services Secure Chat Preferred 9a-5:30pm Office: [phone]    Dorathy Kinsman [facility] 05/30/2023, 5:23 PM

## 2023-05-30 NOTE — Progress Notes (Signed)
 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  He denied trauma, insect bite, or animal bite.  He had a fever of 102.7 at presentation.  Goals of Care:   Code Status: Limited: Do not attempt resuscitation (DNR) -DNR-LIMITED -Do Not Intubate/DNI    DVT prophylaxis: enoxaparin (LOVENOX) injection 40 mg Start: 05/27/23 1000   Interim Hx: Tmax 99.9.  Blood pressure systolic 91-133.  Vitals otherwise stable.  Patient and family report that the erythema in his leg has diminished though not resolved but indicate that the swelling has not significantly improved thus far.  No new complaints today.  Assessment & Plan:  Refractory right leg swelling and erythema - recurrent nonpurulent cellulitis B, presently on right Followed chronically in the ID outpatient clinic - has been treated empirically with IV Rocephin -blood cultures no growth -CTa chest with no PE -right lower extremity venous duplex negative for DVT -appears to be slowly improving clinically -anticipate transition to oral antibiotic soon with discharge home -awaiting recommendation on oral agent to be used by ID as well as on clinical improvement  Onychomycosis and tinea pedis Counseled on foot hygiene and use of antifungals as preventatives  Elevated troponin due to demand ischemia Troponin mildly elevated in the 90s with a flat trend -asymptomatic with no EKG changes - CTa chest negative for PE -TTE with preserved LVEF -no other workup indicated at present  Chronic left upper quadrant abdominal pain CT A/P with contrast without acute findings -pain resolved  Hypothyroidism Continue usual dose of levothyroxine  HLD Continue usual  statin dose  HTN BP currently controlled  Neurocognitive disorder -mild dementia due to Alzheimer's disease Family confirms patient has short-term memory impairment -he has been experiencing some worsening confusion while hospitalized  Obesity - Body mass index is 38.75 kg/m.    Family Communication: Spoke with wife at bedside Disposition: Plan for discharge home, likely approximately 4/11   Objective: Blood pressure 133/79, pulse (!) 57, temperature 99.6 F (37.6 C), temperature source Oral, resp. rate 16, height 5\' 10"  (1.778 m), weight 122.5 kg, SpO2 94%.  Intake/Output Summary (Last 24 hours) at 05/30/2023 0923 Last data filed at 05/30/2023 0001 Gross per 24 hour  Intake 100 ml  Output --  Net 100 ml   Filed Weights   05/26/23 2329  Weight: 122.5 kg    Examination: General: No acute respiratory distress Lungs: Clear to auscultation bilaterally without wheezes or crackles Cardiovascular: Regular rate and rhythm without murmur gallop or rub normal S1 and S2 Abdomen: Nontender, nondistended, soft, bowel sounds positive, no rebound, no ascites, no appreciable mass Extremities: Persisting 2+ edema of the right lower extremity below the knee to the toes with persisting circumferential lower extremity erythema in a pattern suggestive of leukocytoclastic vasculitis  CBC: Recent Labs  Lab 05/27/23 0005 05/27/23 0836 05/28/23 0646 05/29/23 0554  WBC 19.2* 15.3* 15.7* 11.4*  NEUTROABS 17.5*  --   --   --   HGB 16.4 14.8 15.7 14.3  HCT 47.8 42.4 46.2 41.8  MCV 89.5 88.9 90.2 89.3  PLT 260 225 230 205   Basic Metabolic Panel: Recent Labs  Lab 05/27/23 0005 05/27/23 0836 05/28/23 0646  NA 135  --  136  K 3.9  --  3.8  CL 101  --  101  CO2 22  --  24  GLUCOSE 152*  --  127*  BUN 16  --  14  CREATININE 1.17 1.05 1.10  CALCIUM 9.2  --  9.2   GFR: Estimated Creatinine Clearance: 80.8 mL/min (by C-G formula based on SCr of 1.1 mg/dL).   Scheduled Meds:   enoxaparin (LOVENOX) injection  40 mg Subcutaneous Daily   hydrOXYzine  25 mg Oral QHS   levothyroxine  175 mcg Oral QAC breakfast   memantine  10 mg Oral BID   rosuvastatin  5 mg Oral Daily   terbinafine  250 mg Oral Daily   Continuous Infusions:   ceFAZolin (ANCEF) IV 2 g (05/30/23 0528)     LOS: 3 days   Lonia Blood, MD [facility] Office  [phone] Pager - Text Page per Loretha Stapler  If 7PM-7AM, please contact night-coverage per Amion 05/30/2023, 9:23 AM

## 2023-05-30 NOTE — Progress Notes (Signed)
 [facility]  Date of Admission:  05/26/2023     Reason for Follow Up: Recurrent cellulitis  Total days of antibiotics 5         ASSESSMENT:  Jonathan Powers blood cultures are finalized without growth to date indicating no bacteremia in the setting of right lower extremity cellulitis.  Given continued improvements discussed plan of care to change cefazolin to oral cephalexin 500 mg p.o. 4 times daily as he previously had a rash associated with cefadroxil but has been able to tolerate cephalexin.  Continue to keep leg elevated.  Standard/universal precautions.  Remaining medical and supportive care per internal medicine.  PLAN:  Change antibiotics to cephalexin 500 mg p.o. 4 times daily. Elevate leg and encouraged ankle pump exercises. Continue standard/universal precautions. Remaining medical and supportive care per internal medicine.  Principal Problem:   Recurrent cellulitis Active Problems:   Cellulitis   Onychomycosis   Tinea pedis   Dementia (HCC)    cephALEXin  500 mg Oral QID   enoxaparin (LOVENOX) injection  40 mg Subcutaneous Daily   hydrOXYzine  25 mg Oral QHS   levothyroxine  175 mcg Oral QAC breakfast   memantine  10 mg Oral BID   rosuvastatin  5 mg Oral Daily   terbinafine  250 mg Oral Daily    SUBJECTIVE:  Afebrile overnight with no acute events.  Continues to have redness and swelling with mild decrease in pain.  Tolerating antibiotics with no adverse side effects.  Denies fevers, chills, or sweats.  Allergies  Allergen Reactions   Nsaids Shortness Of Breath    Naproxen specifically causes SOB/wheeze tolerates topical diclofenac without problem Tylenol OK   Tolmetin Shortness Of Breath    Naproxen specifically causes SOB/wheeze tolerates topical diclofenac without problem Tylenol OK     Review of Systems: Review of Systems  Constitutional:  Negative for chills, fever and weight loss.  Respiratory:  Negative for  cough, shortness of breath and wheezing.   Cardiovascular:  Positive for leg swelling. Negative for chest pain.  Gastrointestinal:  Negative for abdominal pain, constipation, diarrhea, nausea and vomiting.  Skin:  Negative for rash.      OBJECTIVE: Vitals:   05/30/23 0018 05/30/23 0445 05/30/23 0853 05/30/23 1255  BP: 119/70 (!) 91/46 133/79 108/66  Pulse: 64 (!) 57  63  Resp: 16 16  17   Temp: 98.5 F (36.9 C) 99.9 F (37.7 C) 99.6 F (37.6 C) 98.2 F (36.8 C)  TempSrc: Oral Oral Oral Oral  SpO2: 97% 93% 94% 95%  Weight:      Height:       Body mass index is 38.75 kg/m.  Physical Exam Constitutional:      General: He is not in acute distress.    Appearance: He is well-developed.  Cardiovascular:     Rate and Rhythm: Normal rate and regular rhythm.     Heart sounds: Normal heart sounds.  Pulmonary:     Effort: Pulmonary effort is normal.     Breath sounds: Normal breath sounds.  Skin:    General: Skin is warm and dry.     Comments: Right distal lower extremity remains edematous and red.  Warm to the touch and generalized tenderness  Neurological:     Mental Status: He is alert.  Psychiatric:        Mood and Affect: Mood normal.     Lab Results Lab Results  Component Value Date   WBC 11.4 (H) 05/29/2023  HGB 14.3 05/29/2023   HCT 41.8 05/29/2023   MCV 89.3 05/29/2023   PLT 205 05/29/2023    Lab Results  Component Value Date   CREATININE 1.10 05/28/2023   BUN 14 05/28/2023   NA 136 05/28/2023   K 3.8 05/28/2023   CL 101 05/28/2023   CO2 24 05/28/2023    Lab Results  Component Value Date   ALT 23 05/28/2023   AST 28 05/28/2023   ALKPHOS 54 05/28/2023   BILITOT 1.5 (H) 05/28/2023     Microbiology: Recent Results (from the past 240 hours)  Culture, blood (Routine x 2)     Status: None (Preliminary result)   Collection Time: 05/26/23 11:45 PM   Specimen: BLOOD RIGHT ARM  Result Value Ref Range Status   Specimen Description BLOOD RIGHT ARM   Final   Special Requests   Final    BOTTLES DRAWN AEROBIC AND ANAEROBIC Blood Culture results may not be optimal due to an inadequate volume of blood received in culture bottles   Culture   Final    NO GROWTH 3 DAYS Performed at [facility], [address] [place], [place] [address]    Report Status PENDING  Incomplete  Culture, blood (Routine x 2)     Status: None (Preliminary result)   Collection Time: 05/27/23 12:05 AM   Specimen: BLOOD RIGHT HAND  Result Value Ref Range Status   Specimen Description BLOOD RIGHT HAND  Final   Special Requests   Final    BOTTLES DRAWN AEROBIC ONLY Blood Culture results may not be optimal due to an inadequate volume of blood received in culture bottles   Culture   Final    NO GROWTH 3 DAYS Performed at [facility], [address], [place], [place] [address]    Report Status PENDING  Incomplete  Resp panel by RT-PCR (RSV, Flu A&B, Covid) Anterior Nasal Swab     Status: None   Collection Time: 05/27/23  1:15 AM   Specimen: Anterior Nasal Swab  Result Value Ref Range Status   SARS Coronavirus 2 by RT PCR NEGATIVE NEGATIVE Final   Influenza A by PCR NEGATIVE NEGATIVE Final   Influenza B by PCR NEGATIVE NEGATIVE Final    Comment: (NOTE) The Xpert Xpress SARS-CoV-2/FLU/RSV plus assay is intended as an aid in the diagnosis of influenza from Nasopharyngeal swab specimens and should not be used as a sole basis for treatment. Nasal washings and aspirates are unacceptable for Xpert Xpress SARS-CoV-2/FLU/RSV testing.  Fact Sheet for Patients: BloggerCourse.com  Fact Sheet for Healthcare Providers: SeriousBroker.it  This test is not yet approved or cleared by the [place] FDA and has been authorized for detection and/or diagnosis of SARS-CoV-2 by FDA under an Emergency Use Authorization (EUA). This EUA will remain in effect (meaning this test can be used) for the duration of  the COVID-19 declaration under Section 564(b)(1) of the Act, 21 U.S.C. section 360bbb-3(b)(1), unless the authorization is terminated or revoked.     Resp Syncytial Virus by PCR NEGATIVE NEGATIVE Final    Comment: (NOTE) Fact Sheet for Patients: BloggerCourse.com  Fact Sheet for Healthcare Providers: SeriousBroker.it  This test is not yet approved or cleared by the [place] FDA and has been authorized for detection and/or diagnosis of SARS-CoV-2 by FDA under an Emergency Use Authorization (EUA). This EUA will remain in effect (meaning this test can be used) for the duration of the COVID-19 declaration under Section 564(b)(1) of the Act, 21  U.S.C. section 360bbb-3(b)(1), unless the authorization is terminated or revoked.  Performed at [facility], [address], [place], [place] [address]   MRSA Next Gen by PCR, Nasal     Status: None   Collection Time: 05/28/23 10:34 AM   Specimen: Nasal Mucosa; Nasal Swab  Result Value Ref Range Status   MRSA by PCR Next Gen NOT DETECTED NOT DETECTED Final    Comment: (NOTE) The GeneXpert MRSA Assay (FDA approved for NASAL specimens only), is one component of a comprehensive MRSA colonization surveillance program. It is not intended to diagnose MRSA infection nor to guide or monitor treatment for MRSA infections. Test performance is not FDA approved in patients less than 27 years old. Performed at [facility], [address], [place], [place] [address]      Marcos Eke, NP [facility]  05/30/2023  3:25 PM

## 2023-05-31 ENCOUNTER — Other Ambulatory Visit: Payer: Self-pay | Admitting: Internal Medicine

## 2023-05-31 ENCOUNTER — Other Ambulatory Visit (HOSPITAL_COMMUNITY): Payer: Self-pay

## 2023-05-31 DIAGNOSIS — G4700 Insomnia, unspecified: Secondary | ICD-10-CM

## 2023-05-31 DIAGNOSIS — L0390 Cellulitis, unspecified: Secondary | ICD-10-CM | POA: Diagnosis not present

## 2023-05-31 LAB — CBC
HCT: 40.3 % (ref 39.0–52.0)
Hemoglobin: 13.7 g/dL (ref 13.0–17.0)
MCH: 30.2 pg (ref 26.0–34.0)
MCHC: 34 g/dL (ref 30.0–36.0)
MCV: 89 fL (ref 80.0–100.0)
Platelets: 293 10*3/uL (ref 150–400)
RBC: 4.53 MIL/uL (ref 4.22–5.81)
RDW: 12.6 % (ref 11.5–15.5)
WBC: 9.1 10*3/uL (ref 4.0–10.5)
nRBC: 0 % (ref 0.0–0.2)

## 2023-05-31 MED ORDER — CEPHALEXIN 500 MG PO CAPS
500.0000 mg | ORAL_CAPSULE | Freq: Four times a day (QID) | ORAL | 0 refills | Status: DC
  Filled 2023-05-31: qty 40, 10d supply, fill #0

## 2023-05-31 MED ORDER — TERBINAFINE HCL 250 MG PO TABS
250.0000 mg | ORAL_TABLET | Freq: Every day | ORAL | 0 refills | Status: AC
  Filled 2023-05-31: qty 10, 10d supply, fill #0

## 2023-05-31 MED ORDER — ACETAMINOPHEN 325 MG PO TABS: 650.0000 mg | ORAL_TABLET | Freq: Four times a day (QID) | ORAL | Status: AC | PRN

## 2023-05-31 NOTE — Discharge Summary (Signed)
 DISCHARGE SUMMARY  Jonathan Powers  MR#: [id number]  DOB:[birth year]  Date of Admission: 05/26/2023 Date of Discharge: 05/31/2023  Attending Physician:Jonathan Grabel Silvestre Gunner, MD  Patient's [name], [name], MD  Disposition: D/C home   Follow-up Appts:  Follow-up Information     Jonathan Grandchild, MD Follow up in 10 day(s).   Specialty: Internal Medicine Contact information: [address] [place] [address] [phone]         [facility]              Follow up.   Why: The clinic will contact you for a follow up visit within the next 10 days. Contact information: [address] [place] [phone]                Tests Needing Follow-up: -determine length of course of antibiotic treatment at visit at ID Clinic -determine length of course of antifungal treatment at visit at ID Clinic   Discharge Diagnoses: Refractory right leg swelling and erythema - recurrent nonpurulent cellulitis B, presently on right Onychomycosis and tinea pedis Elevated troponin due to demand ischemia Chronic left upper quadrant abdominal pain Hypothyroidism HLD HTN Neurocognitive disorder -mild dementia due to Alzheimer's disease Obesity - Body mass index is 38.75 kg/m.  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  He denied trauma, insect bite, or animal bite.  He had a fever of 102.7 at presentation.   Hospital Course:  Refractory right leg swelling and erythema - recurrent nonpurulent cellulitis B, presently on right Followed chronically in the ID outpatient clinic - was treated empirically with IV Rocephin during initial portion of hospital stay - blood cultures no growth -CTa chest with no PE -right  lower extremity venous duplex negative for DVT -appears to be slowly improving clinically - transitioned to oral antibiotic for discharge home, with follow-up in ID clinic -consideration is being given to long-term antibiotic prophylaxis by ID team   Onychomycosis and tinea pedis Counseled patient and his wife at length on foot hygiene and use of OTC antifungals as preventatives -to continue systemic oral antifungal at discharge with decision on length of treatment to be made on follow-up in ID clinic   Elevated troponin due to demand ischemia Troponin mildly elevated in the 90s with a flat trend -asymptomatic with no EKG changes - CTa chest negative for PE -TTE with preserved LVEF -no other workup indicated at present   Chronic left upper quadrant abdominal pain CT A/P with contrast without acute findings - pain resolved   Hypothyroidism Continue usual dose of levothyroxine   HLD Continue usual statin dose   HTN BP currently controlled   Neurocognitive disorder -mild dementia due to Alzheimer's disease Family confirms patient has short-term memory impairment -he has been experiencing some worsening confusion while hospitalized, to include sundowning -hopefully this will improve with discharge home   Obesity - Body mass index is 38.75 kg/m.  Allergies as of 05/31/2023       Reactions   Nsaids Shortness Of Breath   Naproxen specifically causes SOB/wheeze tolerates topical diclofenac without problem Tylenol OK   Tolmetin Shortness Of Breath   Naproxen specifically causes SOB/wheeze tolerates topical diclofenac without problem Tylenol OK        Medication List  STOP taking these medications    hydrOXYzine 25 MG tablet Commonly known as: ATARAX       TAKE these medications    acetaminophen 325 MG tablet Commonly known as: TYLENOL Take 2 tablets (650 mg total) by mouth every 6 (six) hours as needed for mild pain (pain score 1-3), fever, headache or moderate pain  (pain score 4-6).   cephALEXin 500 MG capsule Commonly known as: KEFLEX Take 1 capsule (500 mg total) by mouth 4 (four) times daily for 10 days.   cetirizine 10 MG tablet Commonly known as: ZYRTEC Take 10 mg by mouth daily as needed for allergies.   levothyroxine 175 MCG tablet Commonly known as: SYNTHROID TAKE 1 TABLET BY MOUTH ONCE DAILY BEFORE BREAKFAST   memantine 10 MG tablet Commonly known as: NAMENDA Take 1 tablet (10 mg total) by mouth 2 (two) times daily.   olmesartan 5 MG tablet Commonly known as: BENICAR Take 5 mg by mouth daily.   OVER THE COUNTER MEDICATION Take 2 capsules by mouth daily. prodrome neuro plasmalogen   rosuvastatin 5 MG tablet Commonly known as: Crestor Take 1 tablet (5 mg total) by mouth daily.   terbinafine 250 MG tablet Commonly known as: LAMISIL Take 1 tablet (250 mg total) by mouth daily for 10 days. Start taking on: June 01, 2023   Testosterone 1.62 % Gel Apply 2 Pump topically in the morning.   traZODone 100 MG tablet Commonly known as: DESYREL TAKE 1 TABLET BY MOUTH AT BEDTIME AS NEEDED FOR SLEEP               Durable Medical Equipment  (From admission, onward)           Start     Ordered   05/31/23 1048  For home use only DME Walker rolling  Once       Question Answer Comment  Walker: With 5 Inch Wheels   Patient needs a walker to treat with the following condition Generalized weakness      05/31/23 1049            Day of Discharge BP 130/70 (BP Location: Left Arm)   Pulse 65   Temp 97.7 F (36.5 C) (Axillary)   Resp 17   Ht 5\' 10"  (1.778 m)   Wt 122.5 kg   SpO2 95%   BMI 38.75 kg/m   Physical Exam: General: No acute respiratory distress Lungs: Clear to auscultation bilaterally without wheezes or crackles Cardiovascular: Regular rate and rhythm without murmur gallop or rub normal S1 and S2 Abdomen: Nontender, nondistended, soft, bowel sounds positive, no rebound, no ascites, no appreciable  mass Extremities: Circumferential erythema of the right lower extremity persists but is slightly less prominent than yesterday with appearance consistent with leukocytoclastic vasculitis -2+ edema of the right lower extremity persists and is stable compared to the day prior to discharge -left lower extremity with 1+ edema and no erythema  Basic Metabolic Panel: Recent Labs  Lab 05/27/23 0005 05/27/23 0836 05/28/23 0646  NA 135  --  136  K 3.9  --  3.8  CL 101  --  101  CO2 22  --  24  GLUCOSE 152*  --  127*  BUN 16  --  14  CREATININE 1.17 1.05 1.10  CALCIUM 9.2  --  9.2    CBC: Recent Labs  Lab 05/27/23 0005 05/27/23 0836 05/28/23 0646 05/29/23 0554 05/31/23 0618  WBC 19.2* 15.3* 15.7* 11.4* 9.1  NEUTROABS 17.5*  --   --   --   --  HGB 16.4 14.8 15.7 14.3 13.7  HCT 47.8 42.4 46.2 41.8 40.3  MCV 89.5 88.9 90.2 89.3 89.0  PLT 260 225 230 205 293    Recent Results (from the past 240 hours)  Culture, blood (Routine x 2)     Status: None (Preliminary result)   Collection Time: 05/26/23 11:45 PM   Specimen: BLOOD RIGHT ARM  Result Value Ref Range Status   Specimen Description BLOOD RIGHT ARM  Final   Special Requests   Final    BOTTLES DRAWN AEROBIC AND ANAEROBIC Blood Culture results may not be optimal due to an inadequate volume of blood received in culture bottles   Culture   Final    NO GROWTH 4 DAYS Performed at [facility], [address], [place], [place] [address]    Report Status PENDING  Incomplete  Culture, blood (Routine x 2)     Status: None (Preliminary result)   Collection Time: 05/27/23 12:05 AM   Specimen: BLOOD RIGHT HAND  Result Value Ref Range Status   Specimen Description BLOOD RIGHT HAND  Final   Special Requests   Final    BOTTLES DRAWN AEROBIC ONLY Blood Culture results may not be optimal due to an inadequate volume of blood received in culture bottles   Culture   Final    NO GROWTH 4 DAYS Performed at [facility], [address], [place], [place] [address]    Report Status PENDING  Incomplete  Resp panel by RT-PCR (RSV, Flu A&B, Covid) Anterior Nasal Swab     Status: None   Collection Time: 05/27/23  1:15 AM   Specimen: Anterior Nasal Swab  Result Value Ref Range Status   SARS Coronavirus 2 by RT PCR NEGATIVE NEGATIVE Final   Influenza A by PCR NEGATIVE NEGATIVE Final   Influenza B by PCR NEGATIVE NEGATIVE Final    Comment: (NOTE) The Xpert Xpress SARS-CoV-2/FLU/RSV plus assay is intended as an aid in the diagnosis of influenza from Nasopharyngeal swab specimens and should not be used as a sole basis for treatment. Nasal washings and aspirates are unacceptable for Xpert Xpress SARS-CoV-2/FLU/RSV testing.  Fact Sheet for Patients: BloggerCourse.com  Fact Sheet for Healthcare Providers: SeriousBroker.it  This test is not yet approved or cleared by the [place] FDA and has been authorized for detection and/or diagnosis of SARS-CoV-2 by FDA under an Emergency Use Authorization (EUA). This EUA will remain in effect (meaning this test can be used) for the duration of the COVID-19 declaration under Section 564(b)(1) of the Act, 21 U.S.C. section 360bbb-3(b)(1), unless the authorization is terminated or revoked.     Resp Syncytial Virus by PCR NEGATIVE NEGATIVE Final    Comment: (NOTE) Fact Sheet for Patients: BloggerCourse.com  Fact Sheet for Healthcare Providers: SeriousBroker.it  This test is not yet approved or cleared by the [place] FDA and has been authorized for detection and/or diagnosis of SARS-CoV-2 by FDA under an Emergency Use Authorization (EUA). This EUA will remain in effect (meaning this test can be used) for the duration of the COVID-19 declaration under Section 564(b)(1) of the Act, 21 U.S.C. section 360bbb-3(b)(1), unless the authorization is terminated  or revoked.  Performed at [facility], [address] [place], [place] [address]   MRSA Next Gen by PCR, Nasal     Status: None   Collection Time: 05/28/23 10:34 AM   Specimen: Nasal Mucosa; Nasal Swab  Result Value Ref Range Status   MRSA by  PCR Next Gen NOT DETECTED NOT DETECTED Final    Comment: (NOTE) The GeneXpert MRSA Assay (FDA approved for NASAL specimens only), is one component of a comprehensive MRSA colonization surveillance program. It is not intended to diagnose MRSA infection nor to guide or monitor treatment for MRSA infections. Test performance is not FDA approved in patients less than 69 years old. Performed at [facility], [address] [place], [place] [address]       Time spent in discharge (includes decision making & examination of pt):  35 minutes  05/31/2023, 3:21 PM   Lonia Blood, MD [facility] Office  [phone]

## 2023-05-31 NOTE — TOC Initial Note (Addendum)
 Transition of Care [facility]) - Initial/Assessment Note    Patient Details  Name: Jonathan Powers MRN: [id number] Date of Birth: 04/05/1950  Transition of Care [facility]) CM/SW Contact:    Janae Bridgeman, RN Phone Number: 05/31/2023, 10:56 AM  Clinical Narrative:                 CM met with the patient at the bedside to discuss TOC needs.  The patient admitted for cellulitis and plans to return home with family when stable for discharge.  No family present in the room at this time.  Patient states that he believes he has a RW at home already but will check with his wife, Jonathan Powers when she visits at the bedside.  CM will continue to follow the patient for TOC needs.  05/31/23 1438 - CM met with the patient at the bedside and patient provided the wife's number to contact - Jonathan Powers - [phone], [phone] - I called and left a detailed message with the wife that he was being discharged today.  No answer.  Wife's number was provided to [name], RN discharge nurse.  Patient states that his daughter would likely provide transportation to home.  I called Jonathan Powers, daughter by phone and she confirmed that patient has RW in the storage building at the home that he is able to use.  Daughter states that she will pick him up for discharge after 5 pm today.  Charge RN was notified.  Expected Discharge Plan: Home/Self Care Barriers to Discharge: Continued Medical Work up   Patient Goals and CMS Choice Patient states their goals for this hospitalization and ongoing recovery are:: To return home CMS Medicare.gov Compare Post Acute Care list provided to:: Patient Choice offered to / list presented to : Patient [place] ownership interest in [facility].provided to:: Patient    Expected Discharge Plan and Services   Discharge Planning Services: CM Consult Post Acute Care Choice: Resumption of Svcs/PTA Provider, Durable Medical Equipment Living arrangements for the past 2 months: Single Family  Home                                      Prior Living Arrangements/Services Living arrangements for the past 2 months: Single Family Home Lives with:: Spouse Patient language and need for interpreter reviewed:: Yes Do you feel safe going back to the place where you live?: Yes      Need for Family Participation in Patient Care: Yes (Comment) Care giver support system in place?: Yes (comment)   Criminal Activity/Legal Involvement Pertinent to Current Situation/Hospitalization: No - Comment as needed  Activities of Daily Living   ADL Screening (condition at time of admission) Independently performs ADLs?: Yes (appropriate for developmental age) Is the patient deaf or have difficulty hearing?: No Does the patient have difficulty seeing, even when wearing glasses/contacts?: No Does the patient have difficulty concentrating, remembering, or making decisions?: No  Permission Sought/Granted Permission sought to share information with : Case Manager, Magazine features editor, Family Supports Permission granted to share information with : Yes, Verbal Permission Granted     Permission granted to share info w AGENCY: DME company - possible need for RW  Permission granted to share info w Relationship: spouse - Jonathan Powers     Emotional Assessment Appearance:: Appears stated age Attitude/Demeanor/Rapport: Gracious Affect (typically observed): Accepting Orientation: : Oriented to Self, Oriented to Place, Oriented to  Time, Oriented  to Situation   Psych Involvement: No (comment)  Admission diagnosis:  Cellulitis [L03.90] Cellulitis of right lower extremity [L03.115] Patient Active Problem List   Diagnosis Date Noted   Recurrent cellulitis 05/29/2023   Onychomycosis 05/29/2023   Tinea pedis 05/29/2023   Dementia (HCC) 05/29/2023   Cellulitis 05/27/2023   Dyslipidemia, goal LDL below 100 01/22/2023   Need for prophylactic vaccination and inoculation against varicella  01/16/2023   Mild neurocognitive disorder due to Alzheimer's disease 09/20/2022   Exocrine pancreatic insufficiency 12/03/2021   Lung nodule 07/25/2021   Hypothyroidism 11/11/2018   Insomnia 11/10/2018   GERD (gastroesophageal reflux disease) 11/10/2018   Age related osteoporosis 03/11/2018   Flexural eczema 07/24/2017   Chronic right-sided low back pain without sciatica 07/21/2016   Superior mesenteric artery aneurysm 07/12/2015   Left thyroid nodule    OSA (obstructive sleep apnea) 01/27/2013   Symptomatic PVCs 09/06/2012   BPH (benign prostatic hyperplasia) 09/06/2012   Obesity (BMI 30-39.9) 05/17/2006   Essential hypertension 05/17/2006   Allergic rhinitis 05/17/2006   Primary osteoarthritis 05/17/2006   PCP:  [name], MD Pharmacy:   [facility] [address] [place] - [address] [place] [address] Phone: [phone] Fax: [phone]  [facility] # [address] [place] - [address] [place] [address] Phone: [phone] Fax: [phone]  [id number] - [place], [place] - [address] AT [facility] [address] [place] [address] Phone: [phone] Fax: [phone]  [place] - [facility] [address] [place] [address] Phone: [phone] Fax: [phone]  [id number] [place], [place] - [place] [address] [place] [address] Phone: [phone] Fax: [phone]     Social Drivers of Health (SDOH) Social History: SDOH Screenings   Food Insecurity: No Food Insecurity (05/27/2023)  Housing: Low Risk  (05/27/2023)  Transportation Needs: No Transportation Needs (05/27/2023)  Utilities: Not At Risk (05/27/2023)  Alcohol Screen: Low Risk  (02/19/2023)  Depression (PHQ2-9): Low Risk  (02/19/2023)  Financial Resource Strain: Low Risk  (02/19/2023)  Physical Activity:  Sufficiently Active (02/19/2023)  Social Connections: Unknown (05/27/2023)  Stress: No Stress Concern Present (02/19/2023)  Tobacco Use: Low Risk  (05/27/2023)  Health Literacy: Adequate Health Literacy (02/19/2023)   SDOH Interventions:     Readmission Risk Interventions    05/31/2023   10:56 AM  Readmission Risk Prevention Plan  Post Dischage Appt Complete  Transportation Screening Complete

## 2023-05-31 NOTE — Plan of Care (Signed)

## 2023-06-01 ENCOUNTER — Inpatient Hospital Stay (HOSPITAL_COMMUNITY)
Admission: EM | Admit: 2023-06-01 | Discharge: 2023-06-06 | DRG: 603 | Disposition: A | Discharge status: Home / Self Care | Attending: Internal Medicine | Admitting: Internal Medicine

## 2023-06-01 ENCOUNTER — Ambulatory Visit: Payer: Self-pay

## 2023-06-01 ENCOUNTER — Other Ambulatory Visit: Payer: Self-pay

## 2023-06-01 ENCOUNTER — Telehealth: Admitting: Nurse Practitioner

## 2023-06-01 ENCOUNTER — Encounter (HOSPITAL_COMMUNITY): Payer: Self-pay | Admitting: Emergency Medicine

## 2023-06-01 ENCOUNTER — Ambulatory Visit: Admitting: Nurse Practitioner

## 2023-06-01 DIAGNOSIS — M7731 Calcaneal spur, right foot: Secondary | ICD-10-CM | POA: Diagnosis not present

## 2023-06-01 DIAGNOSIS — Z9049 Acquired absence of other specified parts of digestive tract: Secondary | ICD-10-CM

## 2023-06-01 DIAGNOSIS — L03115 Cellulitis of right lower limb: Principal | ICD-10-CM | POA: Diagnosis present

## 2023-06-01 DIAGNOSIS — F02A18 Dementia in other diseases classified elsewhere, mild, with other behavioral disturbance: Secondary | ICD-10-CM | POA: Diagnosis present

## 2023-06-01 DIAGNOSIS — Z6838 Body mass index (BMI) 38.0-38.9, adult: Secondary | ICD-10-CM

## 2023-06-01 DIAGNOSIS — R7989 Other specified abnormal findings of blood chemistry: Secondary | ICD-10-CM | POA: Insufficient documentation

## 2023-06-01 DIAGNOSIS — Z981 Arthrodesis status: Secondary | ICD-10-CM

## 2023-06-01 DIAGNOSIS — E876 Hypokalemia: Secondary | ICD-10-CM | POA: Diagnosis present

## 2023-06-01 DIAGNOSIS — Z888 Allergy status to other drugs, medicaments and biological substances status: Secondary | ICD-10-CM

## 2023-06-01 DIAGNOSIS — E66812 Obesity, class 2: Secondary | ICD-10-CM | POA: Diagnosis present

## 2023-06-01 DIAGNOSIS — Z800 Family history of malignant neoplasm of digestive organs: Secondary | ICD-10-CM

## 2023-06-01 DIAGNOSIS — Z7901 Long term (current) use of anticoagulants: Secondary | ICD-10-CM

## 2023-06-01 DIAGNOSIS — Z8249 Family history of ischemic heart disease and other diseases of the circulatory system: Secondary | ICD-10-CM

## 2023-06-01 DIAGNOSIS — L0390 Cellulitis, unspecified: Secondary | ICD-10-CM | POA: Diagnosis not present

## 2023-06-01 DIAGNOSIS — Z85820 Personal history of malignant melanoma of skin: Secondary | ICD-10-CM

## 2023-06-01 DIAGNOSIS — Z789 Other specified health status: Secondary | ICD-10-CM

## 2023-06-01 DIAGNOSIS — N400 Enlarged prostate without lower urinary tract symptoms: Secondary | ICD-10-CM | POA: Diagnosis present

## 2023-06-01 DIAGNOSIS — E063 Autoimmune thyroiditis: Secondary | ICD-10-CM | POA: Diagnosis present

## 2023-06-01 DIAGNOSIS — Z8261 Family history of arthritis: Secondary | ICD-10-CM

## 2023-06-01 DIAGNOSIS — M7989 Other specified soft tissue disorders: Secondary | ICD-10-CM | POA: Diagnosis not present

## 2023-06-01 DIAGNOSIS — M810 Age-related osteoporosis without current pathological fracture: Secondary | ICD-10-CM | POA: Diagnosis present

## 2023-06-01 DIAGNOSIS — Z820 Family history of epilepsy and other diseases of the nervous system: Secondary | ICD-10-CM

## 2023-06-01 DIAGNOSIS — Z79890 Hormone replacement therapy (postmenopausal): Secondary | ICD-10-CM

## 2023-06-01 DIAGNOSIS — G4700 Insomnia, unspecified: Secondary | ICD-10-CM | POA: Diagnosis present

## 2023-06-01 DIAGNOSIS — Z79899 Other long term (current) drug therapy: Secondary | ICD-10-CM

## 2023-06-01 DIAGNOSIS — I10 Essential (primary) hypertension: Secondary | ICD-10-CM | POA: Diagnosis present

## 2023-06-01 DIAGNOSIS — Z808 Family history of malignant neoplasm of other organs or systems: Secondary | ICD-10-CM

## 2023-06-01 DIAGNOSIS — Z66 Do not resuscitate: Secondary | ICD-10-CM | POA: Diagnosis present

## 2023-06-01 DIAGNOSIS — Z96643 Presence of artificial hip joint, bilateral: Secondary | ICD-10-CM | POA: Diagnosis present

## 2023-06-01 DIAGNOSIS — Z85828 Personal history of other malignant neoplasm of skin: Secondary | ICD-10-CM

## 2023-06-01 DIAGNOSIS — I5A Non-ischemic myocardial injury (non-traumatic): Secondary | ICD-10-CM | POA: Diagnosis present

## 2023-06-01 DIAGNOSIS — Z833 Family history of diabetes mellitus: Secondary | ICD-10-CM

## 2023-06-01 DIAGNOSIS — M1711 Unilateral primary osteoarthritis, right knee: Secondary | ICD-10-CM | POA: Diagnosis not present

## 2023-06-01 DIAGNOSIS — G309 Alzheimer's disease, unspecified: Secondary | ICD-10-CM | POA: Diagnosis present

## 2023-06-01 DIAGNOSIS — G4733 Obstructive sleep apnea (adult) (pediatric): Secondary | ICD-10-CM | POA: Diagnosis present

## 2023-06-01 DIAGNOSIS — B351 Tinea unguium: Secondary | ICD-10-CM | POA: Diagnosis present

## 2023-06-01 DIAGNOSIS — E785 Hyperlipidemia, unspecified: Secondary | ICD-10-CM | POA: Diagnosis present

## 2023-06-01 LAB — CBC WITH DIFFERENTIAL/PLATELET
Abs Immature Granulocytes: 0.05 10*3/uL (ref 0.00–0.07)
Basophils Absolute: 0 10*3/uL (ref 0.0–0.1)
Basophils Relative: 0 %
Eosinophils Absolute: 0.3 10*3/uL (ref 0.0–0.5)
Eosinophils Relative: 4 %
HCT: 44 % (ref 39.0–52.0)
Hemoglobin: 14.4 g/dL (ref 13.0–17.0)
Immature Granulocytes: 1 %
Lymphocytes Relative: 19 %
Lymphs Abs: 1.6 10*3/uL (ref 0.7–4.0)
MCH: 30.3 pg (ref 26.0–34.0)
MCHC: 32.7 g/dL (ref 30.0–36.0)
MCV: 92.6 fL (ref 80.0–100.0)
Monocytes Absolute: 0.7 10*3/uL (ref 0.1–1.0)
Monocytes Relative: 9 %
Neutro Abs: 5.6 10*3/uL (ref 1.7–7.7)
Neutrophils Relative %: 67 %
Platelets: 388 10*3/uL (ref 150–400)
RBC: 4.75 MIL/uL (ref 4.22–5.81)
RDW: 12.5 % (ref 11.5–15.5)
WBC: 8.3 10*3/uL (ref 4.0–10.5)
nRBC: 0 % (ref 0.0–0.2)

## 2023-06-01 LAB — BASIC METABOLIC PANEL WITH GFR
Anion gap: 11 (ref 5–15)
BUN: 14 mg/dL (ref 8–23)
CO2: 26 mmol/L (ref 22–32)
Calcium: 8.6 mg/dL — ABNORMAL LOW (ref 8.9–10.3)
Chloride: 100 mmol/L (ref 98–111)
Creatinine, Ser: 0.92 mg/dL (ref 0.61–1.24)
GFR, Estimated: >60 mL/min (ref >60)
Glucose, Bld: 162 mg/dL — ABNORMAL HIGH (ref 70–99)
Potassium: 3.1 mmol/L — ABNORMAL LOW (ref 3.5–5.1)
Sodium: 137 mmol/L (ref 135–145)

## 2023-06-01 LAB — TROPONIN I (HIGH SENSITIVITY)
Troponin I (High Sensitivity): 37 ng/L — ABNORMAL HIGH (ref <18)
Troponin I (High Sensitivity): 35 ng/L — ABNORMAL HIGH (ref <18)

## 2023-06-01 LAB — CULTURE, BLOOD (ROUTINE X 2)

## 2023-06-01 LAB — D-DIMER, QUANTITATIVE: D-Dimer, Quant: 0.88 ug{FEU}/mL — ABNORMAL HIGH (ref 0.00–0.50)

## 2023-06-01 LAB — TSH: TSH: 2.222 u[IU]/mL (ref 0.350–4.500)

## 2023-06-01 LAB — MAGNESIUM: Magnesium: 2 mg/dL (ref 1.7–2.4)

## 2023-06-01 MED ORDER — SODIUM CHLORIDE 0.9 % IV SOLN
1.0000 g | Freq: Once | INTRAVENOUS | Status: DC
  Filled 2023-06-01: qty 10

## 2023-06-01 MED ORDER — CEFAZOLIN SODIUM-DEXTROSE 2-4 GM/100ML-% IV SOLN
2.0000 g | Freq: Once | INTRAVENOUS | Status: AC
  Administered 2023-06-01: 2 g via INTRAVENOUS
  Filled 2023-06-01: qty 100

## 2023-06-01 MED ORDER — APIXABAN 5 MG PO TABS
10.0000 mg | ORAL_TABLET | Freq: Once | ORAL | Status: AC
  Administered 2023-06-01: 10 mg via ORAL
  Filled 2023-06-01: qty 2

## 2023-06-01 MED ORDER — POTASSIUM CHLORIDE CRYS ER 20 MEQ PO TBCR
40.0000 meq | EXTENDED_RELEASE_TABLET | Freq: Once | ORAL | Status: AC
  Administered 2023-06-01: 40 meq via ORAL
  Filled 2023-06-01: qty 2

## 2023-06-01 NOTE — ED Provider Notes (Signed)
 [place] EMERGENCY DEPARTMENT AT [facility] Provider Note   CSN: [id number] Arrival date & time: 06/01/23  1904     History  Chief Complaint  Patient presents with   Leg Pain    Jonathan Powers is a 73 y.o. male.  Pt is a 73 yo male with pmhx significant for htn, skin cancer, hashimoto's thyroiditis, gout, gerd, Alzheimer's disease, hypogonadism, and recurrent cellulitis.  Pt pt was admitted from 4/5-4/10 for cellulitis.  His leg was looking better, so he was d/c with po keflex.  Today, his right leg became more red and swollen and he did a video visit with his pcp.  Pcp sent him in here for assessment and for possible DVT eval.  No fevers/chills.       Home Medications Prior to Admission medications   Medication Sig Start Date End Date Taking? Authorizing Provider  acetaminophen (TYLENOL) 325 MG tablet Take 2 tablets (650 mg total) by mouth every 6 (six) hours as needed for mild pain (pain score 1-3), fever, headache or moderate pain (pain score 4-6). 05/31/23  Yes Lonia Blood, MD  cephALEXin (KEFLEX) 500 MG capsule Take 1 capsule (500 mg total) by mouth 4 (four) times daily for 10 days. 05/31/23 06/10/23 Yes Lonia Blood, MD  cetirizine (ZYRTEC) 10 MG tablet Take 10 mg by mouth daily as needed for allergies.   Yes [provider]  dicyclomine (BENTYL) 10 MG capsule Take 10 mg by mouth 3 (three) times daily. 05/27/23  Yes [provider]  levothyroxine (SYNTHROID) 175 MCG tablet TAKE 1 TABLET BY MOUTH ONCE DAILY BEFORE BREAKFAST 05/28/23  Yes [name], MD  memantine (NAMENDA) 10 MG tablet Take 1 tablet (10 mg total) by mouth 2 (two) times daily. 04/25/23   Marcos Eke, PA-C  olmesartan (BENICAR) 5 MG tablet Take 5 mg by mouth daily.    [provider]  OVER THE COUNTER MEDICATION Take 2 capsules by mouth daily. prodrome neuro plasmalogen    [provider]  rosuvastatin (CRESTOR) 5 MG tablet Take 1 tablet (5 mg  total) by mouth daily. 01/22/23   [name], MD  terbinafine (LAMISIL) 250 MG tablet Take 1 tablet (250 mg total) by mouth daily for 10 days. 06/01/23 06/11/23  Lonia Blood, MD  Testosterone 1.62 % GEL Apply 2 Pump topically in the morning. 11/26/20   [provider]  traZODone (DESYREL) 100 MG tablet TAKE 1 TABLET BY MOUTH AT BEDTIME AS NEEDED FOR SLEEP 06/01/23   [name], MD  apixaban (ELIQUIS) 2.5 MG TABS tablet Take 1 tablet (2.5 mg total) by mouth 2 (two) times daily. 03/07/19 05/16/19  Allena Katz, PA-C      Allergies    Nsaids and Tolmetin    Review of Systems   Review of Systems  Skin:  Positive for rash.  All other systems reviewed and are negative.   Physical Exam Updated Vital Signs BP 124/68   Pulse 62   Temp 98.8 F (37.1 C) (Oral)   Resp 18   Ht 5\' 10"  (1.778 m)   Wt 122.5 kg   SpO2 96%   BMI 38.75 kg/m  Physical Exam Vitals and nursing note reviewed.  Constitutional:      Appearance: Normal appearance.  HENT:     Head: Normocephalic and atraumatic.     Right Ear: External ear normal.     Left Ear: External ear normal.     Nose: Nose  normal.     Mouth/Throat:     Mouth: Mucous membranes are moist.     Pharynx: Oropharynx is clear.  Eyes:     Extraocular Movements: Extraocular movements intact.     Conjunctiva/sclera: Conjunctivae normal.     Pupils: Pupils are equal, round, and reactive to light.  Cardiovascular:     Rate and Rhythm: Normal rate and regular rhythm.     Pulses: Normal pulses.     Heart sounds: Normal heart sounds.  Pulmonary:     Effort: Pulmonary effort is normal.     Breath sounds: Normal breath sounds.  Abdominal:     General: Abdomen is flat. Bowel sounds are normal.     Palpations: Abdomen is soft.  Musculoskeletal:        General: Swelling present. Normal range of motion.     Cervical back: Normal range of motion and neck supple.     Right lower leg: Edema present.     Comments: RLE with  cellulitis and lymphangitis  Skin:    General: Skin is warm.     Capillary Refill: Capillary refill takes less than 2 seconds.  Neurological:     General: No focal deficit present.     Mental Status: He is alert and oriented to person, place, and time.  Psychiatric:        Mood and Affect: Mood normal.        Behavior: Behavior normal.     ED Results / Procedures / Treatments   Labs (all labs ordered are listed, but only abnormal results are displayed) Labs Reviewed  BASIC METABOLIC PANEL WITH GFR - Abnormal; Notable for the following components:      Result Value   Potassium 3.1 (*)    Glucose, Bld 162 (*)    Calcium 8.6 (*)    All other components within normal limits  D-DIMER, QUANTITATIVE - Abnormal; Notable for the following components:   D-Dimer, Quant 0.88 (*)    All other components within normal limits  TROPONIN I (HIGH SENSITIVITY) - Abnormal; Notable for the following components:   Troponin I (High Sensitivity) 37 (*)    All other components within normal limits  CBC WITH DIFFERENTIAL/PLATELET  TSH  MAGNESIUM  TROPONIN I (HIGH SENSITIVITY)    EKG None  Radiology No results found.  Procedures Procedures    Medications Ordered in ED Medications  ceFAZolin (ANCEF) IVPB 2g/100 mL premix (0 g Intravenous Stopped 06/01/23 2121)  apixaban (ELIQUIS) tablet 10 mg (10 mg Oral Given 06/01/23 2124)  potassium chloride SA (KLOR-CON M) CR tablet 40 mEq (40 mEq Oral Given 06/01/23 2124)    ED Course/ Medical Decision Making/ A&P                                 Medical Decision Making Amount and/or Complexity of Data Reviewed Labs: ordered.  Risk Prescription drug management. Decision regarding hospitalization.   This patient presents to the ED for concern of leg pain/redness, this involves an extensive number of treatment options, and is a complaint that carries with it a high risk of complications and morbidity.  The differential diagnosis includes  cellulitis, dvt   Co morbidities that complicate the patient evaluation  htn, skin cancer, hashimoto's thyroiditis, gout, gerd, Alzheimer's disease, hypogonadism, and recurrent cellulitis   Additional history obtained:  Additional history obtained from epic chart review External records from outside source obtained and reviewed including family  Lab Tests:  I Ordered, and personally interpreted labs.  The pertinent results include:  cbc nl, bmp nl other than k low at 3.1, ddimer elevated at 0.88, trop sl elevated at 37 (82 on 4/6), mg 2.0   Medicines ordered and prescription drug management:  I ordered medication including ancef  for cellulitis  Reevaluation of the patient after these medicines showed that the patient improved I have reviewed the patients home medicines and have made adjustments as needed   Test Considered:  [place], but not available now   Consultations Obtained:  I requested consultation with the hospitalists (Dr. Lazarus Salines),  and discussed lab and imaging findings as well as pertinent plan - he will admit   Problem List / ED Course:  RLE cellulitis:  cellulitis almost gone at d/c yesterday.  It is now really beefy red with some lymphangitis. Pt given ancef as that is what ID recommended. Hypokalemia:  kdur given Elevated ddimer: concern for DVT with elevated ddimer.  We are unable to get [place] tonight, so I will give eliquis tonight and [place] will need to be done in the am.   Reevaluation:  After the interventions noted above, I reevaluated the patient and found that they have :improved   Social Determinants of Health:  Lives at home   Dispostion:  After consideration of the diagnostic results and the patients response to treatment, I feel that the patent would benefit from admission.          Final Clinical Impression(s) / ED Diagnoses Final diagnoses:  Cellulitis of right lower extremity  Hypokalemia  Failure of outpatient treatment   Elevated d-dimer    Rx / DC Orders ED Discharge Orders     None         Jacalyn Lefevre, MD 06/01/23 2215

## 2023-06-01 NOTE — Assessment & Plan Note (Signed)
 Chronic, worsened since discharge home He reports he is afebrile today I am concerned that he may be developing DVT versus infection is worsening.  Unfortunately I am unable to rule out DVT via virtual visit today.  I have recommended he proceed back to the emergency department for evaluation.  He may also need change in antibiotic if evidence of worsening infection is present upon evaluation in the ER.  Based on limited exam via video it does appear that infection may be worsening and not responding appropriately to Keflex. Patient family member report their understanding and will proceed to the emergency department this evening.

## 2023-06-01 NOTE — ED Triage Notes (Signed)
 Patient presents due right lower leg cellulitis. MD wanted to rule out a blood clot.

## 2023-06-01 NOTE — Progress Notes (Signed)
 Established Patient Office Visit  An audio/visual tele-health visit was completed today for this patient. I connected with  Jonathan Powers on 06/01/23 utilizing audio/visual technology and verified that I am speaking with the correct person using two identifiers. The patient was located at their home, and I was located at the office of [facility] during the encounter. I discussed the limitations of evaluation and management by telemedicine. The patient expressed understanding and agreed to proceed.    Subjective   Patient ID: Jonathan Powers, male    DOB: 1950-10-12  Age: 72 y.o. MRN: [id number]  Chief Complaint  Patient presents with   Cellulitis    On the right leg was release yesterday from the hospital, has not improve since then.    Patient arrives today for virtual visit accompanied by family member.  He is a 73 year old male with past medical history significant for arthritis, melanoma, GERD, hypertension, hypothyroidism, osteoporosis, exocrine pancreatic insufficiency, obstructive sleep apnea, Alzheimer's and gout.  He was hospitalized from 05/26/2023 to 05/31/2023 for refractory right leg swelling and erythema.  He has recurrent nonpurulent cellulitis.  Reports history of having this in his left leg but this hospitalization it was his right leg that was affected.  He originally presented to the emergency room on 05/26/2023 with history of 2-day right lower leg extremity swelling and pain.  No known trauma to the leg, insect bite, or animal bite.  He did have fever up to 102.7 at presentation.  He is chronically followed by infectious disease in the outpatient clinic and was treated with IV Rocephin.  Workup included ruling out PE and DVT.  He improved and was ultimately transition to oral antibiotics/antifungal (cephalexin and terbinafine) and discharged home.  He presents today because he is experiencing increased redness, calf pain, tightness, and pain to  the right lower extremity.  He is scheduled to see infectious disease on Wednesday.    ROS: see HPI    Objective:     There were no vitals taken for this visit.   Physical Exam Comprehensive physical exam not completed today as office visit was conducted remotely.  Patient appears fairly well over video, he is able to show me his right leg.  It is red from foot to leg, family member reports that foot is more red today than it was yesterday.  Patient also reports increased tightness especially to his calf, [name]' sign weakly positive on exam.  Patient was alert and oriented, and appeared to have appropriate judgment.   No results found for any visits on 06/01/23.    The 10-year ASCVD risk score (Arnett DK, et al., 2019) is: 21.8%    Assessment & Plan:   Problem List Items Addressed This Visit       Other   Recurrent cellulitis - Primary   Chronic, worsened since discharge home He reports he is afebrile today I am concerned that he may be developing DVT versus infection is worsening.  Unfortunately I am unable to rule out DVT via virtual visit today.  I have recommended he proceed back to the emergency department for evaluation.  He may also need change in antibiotic if evidence of worsening infection is present upon evaluation in the ER.  Based on limited exam via video it does appear that infection may be worsening and not responding appropriately to Keflex. Patient family member report their understanding and will proceed to the emergency department this evening.  Return if symptoms worsen or fail to improve.    Elenore Paddy, NP

## 2023-06-01 NOTE — Telephone Encounter (Signed)
  Chief Complaint: pain to lower leg (right ), Tylenol not helping  Symptoms: increased pain, swelling and redness Frequency: today  Disposition: [] ED /[] Urgent Care (no appt availability in office) / [] Appointment(In office/virtual)/ [x]  [place] [facility]/ [] Home Care/ [] Refused Recommended Disposition /[] [facility]/ []  Follow-up with PCP Additional Notes: Pt and daughter wanted virtual visit-advised to measure circumference of calf and ankle and take pic of redness- if increase of circumference or worsening redness or pain to go to ED. Copied from CRM [id number]. Topic: Clinical - Red Word Triage >> Jun 01, 2023  2:35 PM Freda Munro wrote: Red Word that prompted transfer to Nurse Triage: Severe Pain/ swelling patient was in the hospital for cellulitis he was in there from Sunday till yesterday. The is on antibiotic, antifungal and tylenol the tylenol is not helping. Reason for Disposition . [1] MODERATE pain (e.g., interferes with normal activities, limping) AND [2] present > 3 days  Answer Assessment - Initial Assessment Questions 1. ONSET: "When did the pain start?"      Since discharge 2. LOCATION: "Where is the pain located?"      Right lower leg and ankle  3. PAIN: "How bad is the pain?"    (Scale 1-10; or mild, moderate, severe)   -  MILD (1-3): doesn't interfere with normal activities    -  MODERATE (4-7): interferes with normal activities (e.g., work or school) or awakens from sleep, limping    -  SEVERE (8-10): excruciating pain, unable to do any normal activities, unable to walk     Moderate to severe 4. WORK OR EXERCISE: "Has there been any recent work or exercise that involved this part of the body?"      no 5. CAUSE: "What do you think is causing the leg pain?"     cellulitis 6. OTHER SYMPTOMS: "Do you have any other symptoms?" (e.g., chest pain, back pain, breathing difficulty, swelling, rash, fever, numbness, weakness)     Swelling and redness is  worse  Protocols used: Leg Pain-A-AH

## 2023-06-02 ENCOUNTER — Observation Stay (HOSPITAL_COMMUNITY)

## 2023-06-02 DIAGNOSIS — R609 Edema, unspecified: Secondary | ICD-10-CM

## 2023-06-02 DIAGNOSIS — G4733 Obstructive sleep apnea (adult) (pediatric): Secondary | ICD-10-CM | POA: Diagnosis not present

## 2023-06-02 DIAGNOSIS — I10 Essential (primary) hypertension: Secondary | ICD-10-CM | POA: Diagnosis not present

## 2023-06-02 DIAGNOSIS — Z8249 Family history of ischemic heart disease and other diseases of the circulatory system: Secondary | ICD-10-CM | POA: Diagnosis not present

## 2023-06-02 DIAGNOSIS — E66812 Obesity, class 2: Secondary | ICD-10-CM | POA: Diagnosis not present

## 2023-06-02 DIAGNOSIS — Z79899 Other long term (current) drug therapy: Secondary | ICD-10-CM | POA: Diagnosis not present

## 2023-06-02 DIAGNOSIS — Z66 Do not resuscitate: Secondary | ICD-10-CM | POA: Diagnosis not present

## 2023-06-02 DIAGNOSIS — Z8261 Family history of arthritis: Secondary | ICD-10-CM | POA: Diagnosis not present

## 2023-06-02 DIAGNOSIS — G309 Alzheimer's disease, unspecified: Secondary | ICD-10-CM | POA: Diagnosis not present

## 2023-06-02 DIAGNOSIS — F02A18 Dementia in other diseases classified elsewhere, mild, with other behavioral disturbance: Secondary | ICD-10-CM | POA: Diagnosis not present

## 2023-06-02 DIAGNOSIS — L0390 Cellulitis, unspecified: Secondary | ICD-10-CM | POA: Diagnosis not present

## 2023-06-02 DIAGNOSIS — E063 Autoimmune thyroiditis: Secondary | ICD-10-CM | POA: Diagnosis not present

## 2023-06-02 DIAGNOSIS — R7989 Other specified abnormal findings of blood chemistry: Secondary | ICD-10-CM | POA: Insufficient documentation

## 2023-06-02 DIAGNOSIS — Z888 Allergy status to other drugs, medicaments and biological substances status: Secondary | ICD-10-CM | POA: Diagnosis not present

## 2023-06-02 DIAGNOSIS — Z7901 Long term (current) use of anticoagulants: Secondary | ICD-10-CM | POA: Diagnosis not present

## 2023-06-02 DIAGNOSIS — Z79890 Hormone replacement therapy (postmenopausal): Secondary | ICD-10-CM | POA: Diagnosis not present

## 2023-06-02 DIAGNOSIS — Z85828 Personal history of other malignant neoplasm of skin: Secondary | ICD-10-CM | POA: Diagnosis not present

## 2023-06-02 DIAGNOSIS — Z9049 Acquired absence of other specified parts of digestive tract: Secondary | ICD-10-CM | POA: Diagnosis not present

## 2023-06-02 DIAGNOSIS — Z96643 Presence of artificial hip joint, bilateral: Secondary | ICD-10-CM | POA: Diagnosis not present

## 2023-06-02 DIAGNOSIS — B351 Tinea unguium: Secondary | ICD-10-CM | POA: Diagnosis not present

## 2023-06-02 DIAGNOSIS — I5A Non-ischemic myocardial injury (non-traumatic): Secondary | ICD-10-CM | POA: Diagnosis not present

## 2023-06-02 DIAGNOSIS — L03115 Cellulitis of right lower limb: Secondary | ICD-10-CM | POA: Diagnosis not present

## 2023-06-02 DIAGNOSIS — N400 Enlarged prostate without lower urinary tract symptoms: Secondary | ICD-10-CM | POA: Diagnosis not present

## 2023-06-02 DIAGNOSIS — Z6838 Body mass index (BMI) 38.0-38.9, adult: Secondary | ICD-10-CM | POA: Diagnosis not present

## 2023-06-02 DIAGNOSIS — Z85820 Personal history of malignant melanoma of skin: Secondary | ICD-10-CM | POA: Diagnosis not present

## 2023-06-02 DIAGNOSIS — E785 Hyperlipidemia, unspecified: Secondary | ICD-10-CM | POA: Diagnosis not present

## 2023-06-02 DIAGNOSIS — E876 Hypokalemia: Secondary | ICD-10-CM | POA: Diagnosis not present

## 2023-06-02 LAB — C-REACTIVE PROTEIN: CRP: 5.7 mg/dL — ABNORMAL HIGH (ref <1.0)

## 2023-06-02 LAB — BASIC METABOLIC PANEL WITH GFR
Anion gap: 8 (ref 5–15)
BUN: 13 mg/dL (ref 8–23)
CO2: 28 mmol/L (ref 22–32)
Calcium: 8.5 mg/dL — ABNORMAL LOW (ref 8.9–10.3)
Chloride: 103 mmol/L (ref 98–111)
Creatinine, Ser: 0.87 mg/dL (ref 0.61–1.24)
GFR, Estimated: >60 mL/min (ref >60)
Glucose, Bld: 109 mg/dL — ABNORMAL HIGH (ref 70–99)
Potassium: 3.8 mmol/L (ref 3.5–5.1)
Sodium: 139 mmol/L (ref 135–145)

## 2023-06-02 LAB — LIPID PANEL
Cholesterol: 110 mg/dL (ref 0–200)
HDL: 29 mg/dL — ABNORMAL LOW (ref >40)
LDL Cholesterol: 64 mg/dL (ref 0–99)
Total CHOL/HDL Ratio: 3.8 ratio
Triglycerides: 83 mg/dL (ref <150)
VLDL: 17 mg/dL (ref 0–40)

## 2023-06-02 LAB — MAGNESIUM: Magnesium: 2 mg/dL (ref 1.7–2.4)

## 2023-06-02 LAB — CBC
HCT: 40.5 % (ref 39.0–52.0)
Hemoglobin: 13.3 g/dL (ref 13.0–17.0)
MCH: 30.4 pg (ref 26.0–34.0)
MCHC: 32.8 g/dL (ref 30.0–36.0)
MCV: 92.5 fL (ref 80.0–100.0)
Platelets: 357 10*3/uL (ref 150–400)
RBC: 4.38 MIL/uL (ref 4.22–5.81)
RDW: 12.6 % (ref 11.5–15.5)
WBC: 8 10*3/uL (ref 4.0–10.5)
nRBC: 0 % (ref 0.0–0.2)

## 2023-06-02 LAB — PHOSPHORUS: Phosphorus: 3.1 mg/dL (ref 2.5–4.6)

## 2023-06-02 LAB — SEDIMENTATION RATE: Sed Rate: 83 mm/h — ABNORMAL HIGH (ref 0–16)

## 2023-06-02 MED ORDER — TERBINAFINE HCL 250 MG PO TABS
250.0000 mg | ORAL_TABLET | Freq: Every day | ORAL | Status: DC
  Administered 2023-06-02 – 2023-06-06 (×5): 250 mg via ORAL
  Filled 2023-06-02 (×5): qty 1

## 2023-06-02 MED ORDER — ROSUVASTATIN CALCIUM 5 MG PO TABS
5.0000 mg | ORAL_TABLET | Freq: Every day | ORAL | Status: DC
Start: 2023-06-02 — End: 2023-06-06
  Administered 2023-06-02 – 2023-06-06 (×5): 5 mg via ORAL
  Filled 2023-06-02 (×5): qty 1

## 2023-06-02 MED ORDER — ENOXAPARIN SODIUM 60 MG/0.6ML IJ SOSY
60.0000 mg | PREFILLED_SYRINGE | INTRAMUSCULAR | Status: DC
  Administered 2023-06-02 – 2023-06-05 (×4): 60 mg via SUBCUTANEOUS
  Filled 2023-06-02 (×4): qty 0.6

## 2023-06-02 MED ORDER — OLMESARTAN MEDOXOMIL-HCTZ 40-25 MG PO TABS: 1.0000 | ORAL_TABLET | Freq: Every day | ORAL | Status: DC

## 2023-06-02 MED ORDER — MEMANTINE HCL 10 MG PO TABS
10.0000 mg | ORAL_TABLET | Freq: Two times a day (BID) | ORAL | Status: DC
  Administered 2023-06-02 – 2023-06-06 (×9): 10 mg via ORAL
  Filled 2023-06-02 (×4): qty 1
  Filled 2023-06-02: qty 2
  Filled 2023-06-02 (×3): qty 1
  Filled 2023-06-02: qty 2
  Filled 2023-06-02: qty 1

## 2023-06-02 MED ORDER — TRAZODONE HCL 100 MG PO TABS
100.0000 mg | ORAL_TABLET | Freq: Every evening | ORAL | Status: DC | PRN
  Administered 2023-06-03 – 2023-06-05 (×2): 100 mg via ORAL
  Filled 2023-06-02 (×2): qty 1

## 2023-06-02 MED ORDER — LEVOTHYROXINE SODIUM 50 MCG PO TABS
175.0000 ug | ORAL_TABLET | Freq: Every day | ORAL | Status: DC
  Administered 2023-06-02 – 2023-06-06 (×5): 175 ug via ORAL
  Filled 2023-06-02 (×5): qty 1

## 2023-06-02 MED ORDER — ENSURE ENLIVE PO LIQD
237.0000 mL | Freq: Two times a day (BID) | ORAL | Status: DC
  Administered 2023-06-02 – 2023-06-06 (×4): 237 mL via ORAL

## 2023-06-02 MED ORDER — HYDROCHLOROTHIAZIDE 25 MG PO TABS
25.0000 mg | ORAL_TABLET | Freq: Every day | ORAL | Status: DC
  Administered 2023-06-02 – 2023-06-06 (×4): 25 mg via ORAL
  Filled 2023-06-02 (×5): qty 1

## 2023-06-02 MED ORDER — CEFAZOLIN SODIUM-DEXTROSE 2-4 GM/100ML-% IV SOLN
2.0000 g | Freq: Three times a day (TID) | INTRAVENOUS | Status: DC
  Administered 2023-06-02 – 2023-06-04 (×7): 2 g via INTRAVENOUS
  Filled 2023-06-02 (×8): qty 100

## 2023-06-02 MED ORDER — IRBESARTAN 150 MG PO TABS
300.0000 mg | ORAL_TABLET | Freq: Every day | ORAL | Status: DC
  Administered 2023-06-02 – 2023-06-06 (×4): 300 mg via ORAL
  Filled 2023-06-02 (×5): qty 2

## 2023-06-02 MED ORDER — MELATONIN 3 MG PO TABS: 6.0000 mg | ORAL_TABLET | Freq: Every evening | ORAL | Status: DC | PRN

## 2023-06-02 MED ORDER — ACETAMINOPHEN 500 MG PO TABS
1000.0000 mg | ORAL_TABLET | Freq: Four times a day (QID) | ORAL | Status: DC | PRN
  Administered 2023-06-02 – 2023-06-05 (×7): 1000 mg via ORAL
  Filled 2023-06-02 (×7): qty 2

## 2023-06-02 MED ORDER — ONDANSETRON HCL 4 MG/2ML IJ SOLN: 4.0000 mg | Freq: Four times a day (QID) | INTRAMUSCULAR | Status: DC | PRN

## 2023-06-02 MED ORDER — POLYETHYLENE GLYCOL 3350 17 G PO PACK: 17.0000 g | PACK | Freq: Every day | ORAL | Status: DC | PRN

## 2023-06-02 NOTE — Care Management Obs Status (Signed)
 MEDICARE OBSERVATION STATUS NOTIFICATION   Patient Details  Name: Jonathan Powers MRN: [id number] Date of Birth: Jul 30, 1950   Medicare Observation Status Notification Given:  Yes    Ruthella Kirchman Liane Redman, LCSW 06/02/2023, 9:45 AM

## 2023-06-02 NOTE — Progress Notes (Signed)
 No charge note  Patient seen and examined this morning, admitted overnight, H&P reviewed and I agree with the assessment and plan  This is 73 year old male with history of hypertension, hypothyroidism, mild Alzheimer's, obesity, OSA who presents to the hospital right lower extremity redness.  He was hospitalized for the same and discharged on 4/10 for right lower extremity cellulitis, felt to be refractory and followed with ID in the outpatient clinic.  ID was consulted at time, he was treated with IV Rocephin and eventually transition to Keflex.  Upon returning home, within 2 days patient's redness recurred, has been having subjective chills and came back to the hospital  Principal problem Right lower extremity cellulitis -patient's right lower extremity is erythematous, warm, tender to palpation.  Given quick recurrence when he was converted from IV to oral antibiotics, ID consulted again, appreciate input.  For now continue cefazolin  Active problems Essential hypertension-continue home medications  Hypothyroidism-continue Synthroid  Dementia-continue memantine  Hyperlipidemia-continue statin  Hypokalemia-replace and continue to monitor  Onychomycosis-continue home terbinafine  OSA-not on CPAP  Obesity, class I-BMI 34  Scheduled Meds:  enoxaparin (LOVENOX) injection  60 mg Subcutaneous Q24H   feeding supplement  237 mL Oral BID BM   irbesartan  300 mg Oral Daily   And   hydrochlorothiazide  25 mg Oral Daily   levothyroxine  175 mcg Oral QAC breakfast   memantine  10 mg Oral BID   rosuvastatin  5 mg Oral Daily   terbinafine  250 mg Oral Daily   Continuous Infusions:   ceFAZolin (ANCEF) IV 2 g (06/02/23 0546)   PRN Meds:.acetaminophen, ondansetron (ZOFRAN) IV, polyethylene glycol, traZODone  Kaelen Caughlin M. Aldona Amel, MD, PhD [facility]  Between 7 am - 7 pm you can contact me via Amion (for emergencies) or Securechat (non urgent matters).  I am not available 7 pm - 7  am, please contact night coverage MD/APP via Amion

## 2023-06-02 NOTE — H&P (Signed)
 History and Physical    Jonathan Powers UUV:[id number] DOB: October 05, 1950 DOA: 06/01/2023  PCP: Arcadio Knuckles, MD   Patient coming from: Home   Chief Complaint:  Chief Complaint  Patient presents with   Leg Pain    HPI:  Jonathan Powers is a 73 y.o. male with hx of recent admission from 4/5 - 4/10 for RLE cellulitis ultimately improving on cefazolin/Keflex and transition to Keflex p.o. at home; additional history including Alzheimer's disease, hypertension, hypothyroidism, OSA on CPAP, GERD, gout, who returns with worsening erythema of his right lower extremity and referred by primary care for rule out of possible DVT.  Was adherent with Keflex p.o.  Had rapid extension of redness of his right leg to his knee.  No fevers, chills.  Denies any hardware in the right lower extremity.  No other changes.   Review of Systems:  ROS complete and negative except as marked above   Allergies  Allergen Reactions   Nsaids Shortness Of Breath    Naproxen specifically causes SOB/wheeze tolerates topical diclofenac without problem Tylenol OK   Tolmetin Shortness Of Breath    Naproxen specifically causes SOB/wheeze tolerates topical diclofenac without problem Tylenol OK    Prior to Admission medications   Medication Sig Start Date End Date Taking? Authorizing Provider  acetaminophen (TYLENOL) 325 MG tablet Take 2 tablets (650 mg total) by mouth every 6 (six) hours as needed for mild pain (pain score 1-3), fever, headache or moderate pain (pain score 4-6). 05/31/23  Yes Abbe Abate, MD  cephALEXin (KEFLEX) 500 MG capsule Take 1 capsule (500 mg total) by mouth 4 (four) times daily for 10 days. 05/31/23 06/10/23 Yes Abbe Abate, MD  cetirizine (ZYRTEC) 10 MG tablet Take 10 mg by mouth daily as needed for allergies.   Yes [provider]  dicyclomine (BENTYL) 10 MG capsule Take 10 mg by mouth 3 (three) times daily. 05/27/23  Yes [provider]  levothyroxine (SYNTHROID)  175 MCG tablet TAKE 1 TABLET BY MOUTH ONCE DAILY BEFORE BREAKFAST 05/28/23  Yes Arcadio Knuckles, MD  memantine (NAMENDA) 10 MG tablet Take 1 tablet (10 mg total) by mouth 2 (two) times daily. 04/25/23  Yes Wertman, Sara E, PA-C  olmesartan-hydrochlorothiazide (BENICAR HCT) 40-25 MG tablet Take 1 tablet by mouth daily.   Yes [provider]  rosuvastatin (CRESTOR) 5 MG tablet Take 1 tablet (5 mg total) by mouth daily. 01/22/23  Yes Arcadio Knuckles, MD  terbinafine (LAMISIL) 250 MG tablet Take 1 tablet (250 mg total) by mouth daily for 10 days. 06/01/23 06/11/23 Yes Abbe Abate, MD  Testosterone 1.62 % GEL Apply 2 Pump topically in the morning. 11/26/20  Yes [provider]  traZODone (DESYREL) 100 MG tablet TAKE 1 TABLET BY MOUTH AT BEDTIME AS NEEDED FOR SLEEP 06/01/23  Yes Arcadio Knuckles, MD  apixaban (ELIQUIS) 2.5 MG TABS tablet Take 1 tablet (2.5 mg total) by mouth 2 (two) times daily. 03/07/19 05/16/19  Sheron Dixons, PA-C    Past Medical History:  Diagnosis Date   Acute recurrent pansinusitis 02/17/2021   Age related osteoporosis 03/11/2018   Allergic rhinitis 05/17/2006   BPH (benign prostatic hyperplasia) 09/06/2012   Cellulitis of left lower extremity 11/10/2018   Cellulitis, right leg    Recurrent R hip cellulitis 1/08,3/08    Chronic fatigue, unspecified    Chronic hyperglycemia 06/29/2020   Chronic right-sided low back pain without sciatica 07/21/2016   Chronic sinusitis 07/10/2021  Deviated septum 02/17/2021   Diverticulitis    Essential hypertension 05/17/2006   2014     Impression  Exercise Capacity:  Lexiscan with low level exercise.  BP Response:  Normal blood pressure response.  Clinical Symptoms:  No significant symptoms noted.  ECG Impression:  No significant ST segment change suggestive of ischemia.  Comparison with Prior Nuclear Study: No images to compare     Overall Impression:  Low risk stress nuclear study There is mild apical thinning but no     Exocrine pancreatic insufficiency 12/03/2021   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   Flexural eczema 07/24/2017   Generalized abdominal tenderness with rebound tenderness 08/02/2022   GERD (gastroesophageal reflux disease)    Gout    Hashimoto's thyroiditis    History of skin cancer    Melanoma on back   Hypogonadism male    low T, a/w ED   Hypothyroidism 11/11/2018   Idiopathic urticaria    Insomnia 11/10/2018   Left flank pain 08/02/2022   Left thyroid nodule 2008   on [place], decrease size in 2011 [place]   Lung nodule 07/25/2021   Migraines    Atypical and ocular   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 No acute findings.  2. 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  3. Ectatic abdominal aorta at risk for aneurysm development.  Recommend followup by ultrasound in 5 years. This recommendation  follows ACR consensus guidelines: White Paper of the ACR Incidental  Findings Committee II on Vascular Findings. J Am Coll Radiol 2013;  [phone].  4. 1.   Symptomatic PVCs 09/06/2012    Past Surgical History:  Procedure Laterality Date   CARPAL TUNNEL RELEASE Left 03/30/2017   Procedure: CARPAL TUNNEL RELEASE;  Surgeon: Valeria Batman, MD;  Location: [facility];  Service: Orthopedics;  Laterality: Left;   CHOLECYSTECTOMY  2004   CTR Bilateral 1982   CYSTOSCOPY  1974   FOOT FUSION Left 06/22/2017   HEMORRHOID  SURGERY N/A 03/30/2017   Procedure: HEMORRHOIDECTOMY;  Surgeon: Abigail Miyamoto, MD;  Location: [facility];  Service: General;  Laterality: N/A;   open reduction L little finger Left 1970   Repair digital thumb, left Left 1990   Right shoulder cuff repair Right 09/15/11   Right shoulder SAD, DCR Right 02/05/11   TOTAL HIP ARTHROPLASTY Left 07/26/06   TOTAL HIP ARTHROPLASTY Right 1999   TOTAL HIP REVISION Left 03/07/2019   Procedure: LEFT TOTAL HIP REVISION POSTERIOR APPROACH;  Surgeon: Gean Birchwood, MD;  Location: [facility];  Service: Orthopedics;  Laterality: Left;     reports that he has never smoked. He has never used smokeless tobacco. He reports that he does not currently use alcohol. He reports that he does not use drugs.  Family History  Problem Relation Age of Onset   Arthritis Father    Heart disease Father    Diabetes Father  Vascular Disease Father        AoBifem   Arthritis Mother    Diabetes Mother    Multiple sclerosis Mother    Cancer Paternal Grandmother        "GI"   Cancer Paternal Grandfather        stomach cancer   Thyroid cancer Sister    Uterine cancer Sister      Physical Exam: Vitals:   06/01/23 2330 06/02/23 0000 06/02/23 0030 06/02/23 0100  BP: 121/79 127/70 118/61 125/66  Pulse: (!) 51 (!) 55 (!) 54 (!) 52  Resp: (!) 21 19 (!) 22   Temp:      TempSrc:      SpO2: 96% 95% 95% 94%  Weight:      Height:        Gen: Awake, alert, NAD   CV: Regular, normal S1, S2, no murmurs  Resp: Normal WOB, CTAB  Abd: Flat, normoactive, nontender MSK: Asymmetric with nonpitting edema to the right lower extremity Skin: There is new confluent erythema and increased warmth from the right foot extending up below the right knee.  No open wounds on the feet.  Positive onychomycosis. Neuro: Alert and interactive  Psych: euthymic, appropriate    Data review:   Labs reviewed, notable for:   WBC 8, downtrending D-dimer 0.8  Micro:  Results for  orders placed or performed during the hospital encounter of 05/26/23  Culture, blood (Routine x 2)     Status: None   Collection Time: 05/26/23 11:45 PM   Specimen: BLOOD RIGHT ARM  Result Value Ref Range Status   Specimen Description BLOOD RIGHT ARM  Final   Special Requests   Final    BOTTLES DRAWN AEROBIC AND ANAEROBIC Blood Culture results may not be optimal due to an inadequate volume of blood received in culture bottles   Culture   Final    NO GROWTH 5 DAYS Performed at [facility], [address], [place], [place] [address]    Report Status 06/01/2023 FINAL  Final  Culture, blood (Routine x 2)     Status: None   Collection Time: 05/27/23 12:05 AM   Specimen: BLOOD RIGHT HAND  Result Value Ref Range Status   Specimen Description BLOOD RIGHT HAND  Final   Special Requests   Final    BOTTLES DRAWN AEROBIC ONLY Blood Culture results may not be optimal due to an inadequate volume of blood received in culture bottles   Culture   Final    NO GROWTH 5 DAYS Performed at [facility], [address], [place], [place] [address]    Report Status 06/01/2023 FINAL  Final  Resp panel by RT-PCR (RSV, Flu A&B, Covid) Anterior Nasal Swab     Status: None   Collection Time: 05/27/23  1:15 AM   Specimen: Anterior Nasal Swab  Result Value Ref Range Status   SARS Coronavirus 2 by RT PCR NEGATIVE NEGATIVE Final   Influenza A by PCR NEGATIVE NEGATIVE Final   Influenza B by PCR NEGATIVE NEGATIVE Final    Comment: (NOTE) The Xpert Xpress SARS-CoV-2/FLU/RSV plus assay is intended as an aid in the diagnosis of influenza from Nasopharyngeal swab specimens and should not be used as a sole basis for treatment. Nasal washings and aspirates are unacceptable for Xpert Xpress SARS-CoV-2/FLU/RSV testing.  Fact Sheet for Patients: BloggerCourse.com  Fact Sheet for Healthcare Providers: SeriousBroker.it  This test is not yet approved or  cleared by the [place]   FDA and has been authorized for detection and/or diagnosis of SARS-CoV-2 by FDA under an Emergency Use Authorization (EUA). This EUA will remain in effect (meaning this test can be used) for the duration of the COVID-19 declaration under Section 564(b)(1) of the Act, 21 U.S.C. section 360bbb-3(b)(1), unless the authorization is terminated or revoked.     Resp Syncytial Virus by PCR NEGATIVE NEGATIVE Final    Comment: (NOTE) Fact Sheet for Patients: BloggerCourse.com  Fact Sheet for Healthcare Providers: SeriousBroker.it  This test is not yet approved or cleared by the [place]  FDA and has been authorized for detection and/or diagnosis of SARS-CoV-2 by FDA under an Emergency Use Authorization (EUA). This EUA will remain in effect (meaning this test can be used) for the duration of the COVID-19 declaration under Section 564(b)(1) of the Act, 21 U.S.C. section 360bbb-3(b)(1), unless the authorization is terminated or revoked.  Performed at [facility], [address], [place], [place] [address]   MRSA Next Gen by PCR, Nasal     Status: None   Collection Time: 05/28/23 10:34 AM   Specimen: Nasal Mucosa; Nasal Swab  Result Value Ref Range Status   MRSA by PCR Next Gen NOT DETECTED NOT DETECTED Final    Comment: (NOTE) The GeneXpert MRSA Assay (FDA approved for NASAL specimens only), is one component of a comprehensive MRSA colonization surveillance program. It is not intended to diagnose MRSA infection nor to guide or monitor treatment for MRSA infections. Test performance is not FDA approved in patients less than 29 years old. Performed at [facility], [address] [place], [place] [address]     Imaging reviewed:  No results found.    ED Course:  Treated with cefazolin IV, single dose of apixaban 10 mg p.o. x 1 for possible DVT, potassium.   Assessment/Plan:  73 y.o.  male with hx recent admission from 4/5 - 4/10 for RLE cellulitis ultimately improving on cefazolin/Keflex and transition to Keflex p.o. at home; additional history including Alzheimer's disease, hypertension, hypothyroidism, OSA on CPAP, GERD, gout, who returns with worsening erythema of his right lower extremity and referred by primary care for rule out of possible DVT.   Relapsed cellulitis right lower extremity  vs less likely DVT Suspect relapse cellulitis on oral therapy.  Unclear why he is suddenly worsened as he was on cefazolin and Keflex p.o. with significant improvement while inpatient.  WBC 8 and downtrending from prior.  D-dimer is positive at 0.88.  Although he did have a recent duplex of his right lower extremity which was negative for DVT on 4/6. - Resume on cefazolin 2 g IV every 8 hour with close monitoring, broaden if worsening - Check venous duplex right lower extremity rule out DVT - Consider additional imaging of the right lower extremity (MR) to rule out nidus of recurrent infections/inadequate source control - Recommend discussing with ID re: suppressive antibiotics for recurrent cellulitis  Chronic myocardial injury Chronically elevated high-sensitivity troponin, on this admission 37 -> 35 with no active cardiac symptoms.  Recent TTE with no wall motion abnormalities. ?  Demand in setting of recurrent infections -Check EKG -Check lipids and A1c -Outpatient follow-up with cardiology  Hypokalemia Repleted  Chronic medical problems: Alzheimer's disease: Continue home Namenda Hypothyroidism: Continue home levothyroxine HLD: Continue home rosuvastatin Hypertension: Continue home olmesartan-HCTZ, may need K repletion OP due to hypokalemia Insomnia: On trazodone prn Onychyomycosis: home terbinafine Low testosterone: Note he is on testosterone gel, this will need to be  stopped if he does have VTE.  Held for now OSA: Not on CPAP.  Body mass index is 38.75 kg/m.  Obesity  class II, would benefit from weight loss outpatient  DVT prophylaxis:  Lovenox Code Status:  DNR/DNI(Do NOT Intubate) Diet:  Diet Orders (From admission, onward)     Start     Ordered   06/02/23 0111  Diet regular Room service appropriate? Yes; Fluid consistency: Thin  Diet effective now       Question Answer Comment  Room service appropriate? Yes   Fluid consistency: Thin      06/02/23 0110           Family Communication:  None   Consults:  None   Admission status:   Observation, Med-Surg  Severity of Illness: The appropriate patient status for this patient is OBSERVATION. Observation status is judged to be reasonable and necessary in order to provide the required intensity of service to ensure the patient's safety. The patient's presenting symptoms, physical exam findings, and initial radiographic and laboratory data in the context of their medical condition is felt to place them at decreased risk for further clinical deterioration. Furthermore, it is anticipated that the patient will be medically stable for discharge from the hospital within 2 midnights of admission.    Arnulfo Larch, MD [facility]  How to contact the [facility] Attending or Consulting provider 7A - 7P or covering provider during after hours 7P -7A, for this patient.  Check the care team in [facility] and look for a) attending/consulting [facility] provider listed and b) the [facility] team listed Log into [email or web address] and use [facility]'s universal password to access. If you do not have the password, please contact the hospital operator. Locate the [facility] provider you are looking for under [facility] and page to a number that you can be directly reached. If you still have difficulty reaching the provider, please page the [facility] (Director on Call) for the Hospitalists listed on amion for assistance.  06/02/2023, 1:46 AM

## 2023-06-02 NOTE — ED Notes (Signed)
 ED TO INPATIENT HANDOFF REPORT  ED Nurse Name and Phone #: William Harris, RN   S Name/Age/Gender Jonathan Powers 73 y.o. male Room/Bed: WA04/WA04  Code Status   Code Status: Full Code  Home/SNF/Other Home Patient oriented to: self, place, time, and situation Is this baseline? Yes   Triage Complete: Triage complete  Chief Complaint Recurrent cellulitis [L03.90]  Triage Note Patient presents due right lower leg cellulitis. MD wanted to rule out a blood clot.    Allergies Allergies  Allergen Reactions   Nsaids Shortness Of Breath    Naproxen specifically causes SOB/wheeze tolerates topical diclofenac without problem Tylenol OK   Tolmetin Shortness Of Breath    Naproxen specifically causes SOB/wheeze tolerates topical diclofenac without problem Tylenol OK    Level of Care/Admitting Diagnosis ED Disposition     ED Disposition  Admit   Condition  --   Comment  Hospital Area: [facility] [[id number]]  Level of Care: Med-Surg [16]  May place patient in observation at [facility] or [facility] if equivalent level of care is available:: Yes  Covid Evaluation: Asymptomatic - no recent exposure (last 10 days) testing not required  Diagnosis: Recurrent cellulitis [[id number]]  Admitting Physician: SEGARS, JONATHAN [[id number]]  Attending Physician: SEGARS, JONATHAN [[id number]]          B Medical/Surgery History Past Medical History:  Diagnosis Date   Acute recurrent pansinusitis 02/17/2021   Age related osteoporosis 03/11/2018   Allergic rhinitis 05/17/2006   BPH (benign prostatic hyperplasia) 09/06/2012   Cellulitis of left lower extremity 11/10/2018   Cellulitis, right leg    Recurrent R hip cellulitis 1/08,3/08    Chronic fatigue, unspecified    Chronic hyperglycemia 06/29/2020   Chronic right-sided low back pain without sciatica 07/21/2016   Chronic sinusitis 07/10/2021   Deviated septum 02/17/2021   Diverticulitis    Essential hypertension  05/17/2006   2014     Impression  Exercise Capacity:  Lexiscan with low level exercise.  BP Response:  Normal blood pressure response.  Clinical Symptoms:  No significant symptoms noted.  ECG Impression:  No significant ST segment change suggestive of ischemia.  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   Flexural eczema 07/24/2017   Generalized abdominal tenderness with rebound tenderness 08/02/2022   GERD (gastroesophageal reflux disease)    Gout    Hashimoto's thyroiditis    History of skin cancer    Melanoma on back   Hypogonadism male    low T, a/w ED   Hypothyroidism 11/11/2018   Idiopathic urticaria    Insomnia 11/10/2018   Left flank pain 08/02/2022   Left thyroid nodule 2008   on US , decrease size in 2011 US    Lung nodule 07/25/2021   Migraines    Atypical and ocular   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   09/2015 compliance report> > 4 hours 80% of days, used 25/30 days   Primary osteoarthritis 05/17/2006   Rash 07/22/2022   Reactive thrombocytosis 09/17/2021   Recurrent genital HSV (herpes simplex virus) infection 11/12/2018   Rib fracture 07/22/2022   Superior  mesenteric artery aneurysm 07/12/2015   DEC 2019     IMPRESSION:  VASCULAR     1. No acute findings.  2. 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  3. Ectatic abdominal aorta at risk for aneurysm development.  Recommend followup by ultrasound in 5 years. This recommendation  follows ACR consensus guidelines: White Paper of the ACR Incidental  Findings Committee II on Vascular Findings. J Am Coll Radiol  2013;  [phone].  4. 1.   Symptomatic PVCs 09/06/2012   Past Surgical History:  Procedure Laterality Date   CARPAL TUNNEL RELEASE Left 03/30/2017   Procedure: CARPAL TUNNEL RELEASE;  Surgeon: Shirlee Dotter, MD;  Location: [facility];  Service: Orthopedics;  Laterality: Left;   CHOLECYSTECTOMY  2004   CTR Bilateral 1982   CYSTOSCOPY  1974   FOOT FUSION Left 06/22/2017   HEMORRHOID SURGERY N/A 03/30/2017   Procedure: HEMORRHOIDECTOMY;  Surgeon: Oza Blumenthal, MD;  Location: [facility];  Service: General;  Laterality: N/A;   open reduction L little finger Left 1970   Repair digital thumb, left Left 1990   Right shoulder cuff repair Right 09/15/11   Right shoulder SAD, DCR Right 02/05/11   TOTAL HIP ARTHROPLASTY Left 07/26/06   TOTAL HIP ARTHROPLASTY Right 1999   TOTAL HIP REVISION Left 03/07/2019   Procedure: LEFT TOTAL HIP REVISION POSTERIOR APPROACH;  Surgeon: Wendolyn Hamburger, MD;  Location: [facility];  Service: Orthopedics;  Laterality: Left;     A IV Location/Drains/Wounds Patient Lines/Drains/Airways Status     Active Line/Drains/Airways     Name Placement date Placement time Site Days   Peripheral IV 06/01/23 20 G 1" Anterior;Left Forearm 06/01/23  2007  Forearm  1            Intake/Output Last 24 hours No intake or output data in the 24 hours ending 06/02/23 0113  Labs/Imaging Results for orders placed or performed during the hospital encounter of 06/01/23 (from the past 48 hours)  Basic metabolic panel     Status: Abnormal   Collection Time: 06/01/23  8:06 PM  Result Value Ref Range   Sodium 137 135 - 145 mmol/L   Potassium 3.1 (L) 3.5 - 5.1 mmol/L   Chloride 100 98 - 111 mmol/L   CO2 26 22 - 32 mmol/L   Glucose, Bld 162 (H) 70 - 99 mg/dL    Comment: Glucose reference range applies only to samples taken after fasting for at least 8 hours.   BUN 14 8 - 23 mg/dL   Creatinine, Ser 4.78 0.61 - 1.24 mg/dL   Calcium 8.6 (L) 8.9 - 10.3  mg/dL   GFR, Estimated >29 >56 mL/min    Comment: (NOTE) Calculated using the CKD-EPI Creatinine Equation (2021)    Anion gap 11 5 - 15    Comment: Performed at [facility], [address], [place], [place] [address]  CBC with Differential/Platelet     Status: None   Collection Time: 06/01/23  8:06 PM  Result Value Ref Range   WBC 8.3 4.0 - 10.5 K/uL   RBC 4.75 4.22 - 5.81 MIL/uL   Hemoglobin 14.4 13.0 - 17.0 g/dL   HCT 65.7 84.6 - 96.2 %   MCV 92.6 80.0 - 100.0 fL   MCH 30.3 26.0 - 34.0 pg   MCHC 32.7 30.0 - 36.0 g/dL   RDW 95.2 84.1 - 32.4 %   Platelets 388 150 - 400 K/uL   nRBC 0.0 0.0 -  0.2 %   Neutrophils Relative % 67 %   Neutro Abs 5.6 1.7 - 7.7 K/uL   Lymphocytes Relative 19 %   Lymphs Abs 1.6 0.7 - 4.0 K/uL   Monocytes Relative 9 %   Monocytes Absolute 0.7 0.1 - 1.0 K/uL   Eosinophils Relative 4 %   Eosinophils Absolute 0.3 0.0 - 0.5 K/uL   Basophils Relative 0 %   Basophils Absolute 0.0 0.0 - 0.1 K/uL   Immature Granulocytes 1 %   Abs Immature Granulocytes 0.05 0.00 - 0.07 K/uL    Comment: Performed at [facility], [address], [place], [place] [address]  D-dimer, quantitative     Status: Abnormal   Collection Time: 06/01/23  8:06 PM  Result Value Ref Range   D-Dimer, Quant 0.88 (H) 0.00 - 0.50 ug/mL-FEU    Comment: (NOTE) At the manufacturer cut-off value of 0.5 g/mL FEU, this assay has a negative predictive value of 95-100%.This assay is intended for use in conjunction with a clinical pretest probability (PTP) assessment model to exclude pulmonary embolism (PE) and deep venous thrombosis (DVT) in outpatients suspected of PE or DVT. Results should be correlated with clinical presentation. Performed at [facility], [address], [place], [place] [address]   Troponin I (High Sensitivity)     Status: Abnormal   Collection Time: 06/01/23  8:06 PM  Result Value Ref Range   Troponin I (High  Sensitivity) 37 (H) <18 ng/L    Comment: (NOTE) Elevated high sensitivity troponin I (hsTnI) values and significant  changes across serial measurements may suggest ACS but many other  chronic and acute conditions are known to elevate hsTnI results.  Refer to the "Links" section for chest pain algorithms and additional  guidance. Performed at [facility], [address] [place], [place] [address]   TSH     Status: None   Collection Time: 06/01/23  8:06 PM  Result Value Ref Range   TSH 2.222 0.350 - 4.500 uIU/mL    Comment: Performed by a 3rd Generation assay with a functional sensitivity of <=0.01 uIU/mL. Performed at [facility], [address] [place], [place] [address]   Magnesium     Status: None   Collection Time: 06/01/23  8:06 PM  Result Value Ref Range   Magnesium 2.0 1.7 - 2.4 mg/dL    Comment: Performed at [facility], [address], [place], [place] [address]  Troponin I (High Sensitivity)     Status: Abnormal   Collection Time: 06/01/23  9:51 PM  Result Value Ref Range   Troponin I (High Sensitivity) 35 (H) <18 ng/L    Comment: (NOTE) Elevated high sensitivity troponin I (hsTnI) values and significant  changes across serial measurements may suggest ACS but many other  chronic and acute conditions are known to elevate hsTnI results.  Refer to the "Links" section for chest pain algorithms and additional  guidance. Performed at [facility], [address] [place], [place] [address]    No results found.  Pending Labs Unresulted Labs (From admission, onward)     Start     Ordered   06/02/23 0500  Basic metabolic panel with GFR  Tomorrow morning,   R        06/02/23 0110   06/02/23 0500  CBC  Tomorrow morning,   R        06/02/23 0110   06/02/23 0500  Magnesium  Tomorrow morning,  R        06/02/23 0110   06/02/23 0500  Phosphorus  Tomorrow morning,   R        06/02/23 0110    06/02/23 0500  Sedimentation rate  Tomorrow morning,   R        06/02/23 0110   06/02/23 0500  C-reactive protein  Tomorrow morning,   R        06/02/23 0110            Vitals/Pain Today's Vitals   06/02/23 0000 06/02/23 0025 06/02/23 0030 06/02/23 0100  BP: 127/70  118/61 125/66  Pulse: (!) 55  (!) 54 (!) 52  Resp: 19  (!) 22   Temp:      TempSrc:      SpO2: 95%  95% 94%  Weight:      Height:      PainSc:  Asleep      Isolation Precautions No active isolations  Medications Medications  enoxaparin (LOVENOX) injection 40 mg (has no administration in time range)  acetaminophen (TYLENOL) tablet 1,000 mg (has no administration in time range)  melatonin tablet 6 mg (has no administration in time range)  ondansetron (ZOFRAN) injection 4 mg (has no administration in time range)  polyethylene glycol (MIRALAX / GLYCOLAX) packet 17 g (has no administration in time range)  ceFAZolin (ANCEF) IVPB 2g/100 mL premix (has no administration in time range)  ceFAZolin (ANCEF) IVPB 2g/100 mL premix (0 g Intravenous Stopped 06/01/23 2121)  apixaban (ELIQUIS) tablet 10 mg (10 mg Oral Given 06/01/23 2124)  potassium chloride SA (KLOR-CON M) CR tablet 40 mEq (40 mEq Oral Given 06/01/23 2124)    Mobility walks     Focused Assessments See Chart   R Recommendations: See Admitting Provider Note  Report given to:   Additional Notes: See Chart

## 2023-06-02 NOTE — Consult Note (Incomplete)
 [facility]                                                                                        Patient Identification: Patient Name: Jonathan Powers MRN: [id number] Admit Date: 06/01/2023  7:06 PM Today's Date: 06/02/2023 Reason for consult: Recurrent cellulitis Requesting provider: Dr. Aldona Amel  Principal Problem:   Recurrent cellulitis Active Problems:   Elevated troponin   Antibiotics:  Cefazolin 4/11-  Lines/Hardware:  Assessment # Relapsed cellulitis of rt leg vs recurrence, non purulent - he reports not taking po cephalexin to me after discharge last admission.  Of note he has dementia  -He thinks redness is improving but still swelling and tenderness -I query some chronicity of skin changes due to no fevers, leukocytosis  -He does not seem to have any kind of vascular study including venous reflux of his lower extremities done, could be a cause of recurrence and would benefit from seeing Vascular OP -He does have onychomycosis of his foot and can be a risk factor for recurrence  Recommendations  -Continue cefazolin -X-ray of right leg. Addendum: Xray with no acute abnormality -Keep rt leg elevated  -Needs to follow-up with vascular outpatient for venous reflux studies -Continue terbinafine for onychomycosis.  Discussed to follow-up with podiatry outpatient -Universal/standard isolation precautions   Rest of the management as per the primary team. Please call with questions or concerns.  Thank you for the consult  __________________________________________________________________________________________________________ HPI and Hospital Course: 73 year old male with prior history of melanoma, Alzheimer's disease, hypogonadism BPH, HTN, GERD, gout,, Hashimoto's thyroiditis, hypothyroidism, obesity/OSA/osteoarthritis, recurrent cellulitis, recurrent HSV, bilateral hip  arthroplasty who presented to the ED on 4/11 with increasing redness, swelling on his right leg after recent hospital admission 4/5-4/10 for right leg cellulitis.  Patient was improving on cefazolin inpatient and was discharged on cephalexin.  Per report, he was compliant to p.o. cephalexin but he he reported to me he was not taking p.o. cephalexin. He had rapid extension of redness in his right leg upto the knee when he presented. Patient was seen by PCP and was sent into the ED for R/O DVT as well as worsening cellulitis.  No fever or chills or nausea or vomiting.   He denies any prior vascular disease or venous reflux or lymphedema or prior surgery to his right leg. However has prior episodes of cellulitis in his right leg, at least seen by ID service 2 times in 2024 and one time in 2023 He is also on terbinafine for onychomycosis.  He is not able to tell me if he is following up with a foot doctor.  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  Denies smoking alcohol and recreational drug use.   At ED afebrile Labs remarkable for K3.1 troponin 37, WBC 8.3, D-dimer 0.88 MRSA PCR negative Duplex of lower extremities negative for DVT  ROS: General- Denies fever, chills, loss of appetite and loss of weight HEENT - Denies headache, blurry vision, neck pain, sinus pain Chest - Denies any chest pain, SOB or cough CVS- Denies any dizziness/lightheadedness, syncopal attacks, palpitations Abdomen- Denies any nausea, vomiting, abdominal pain,  hematochezia and diarrhea Neuro - Denies any weakness, numbness, tingling sensation Psych - Denies any changes in mood irritability or depressive symptoms GU- Denies any burning, dysuria, hematuria or increased frequency of urination Skin - denies any rashes/lesions MSK - denies any joint pain/swelling or restricted ROM   Past Medical History:  Diagnosis Date   Acute recurrent pansinusitis 02/17/2021   Age related osteoporosis 03/11/2018   Allergic rhinitis  05/17/2006   BPH (benign prostatic hyperplasia) 09/06/2012   Cellulitis of left lower extremity 11/10/2018   Cellulitis, right leg    Recurrent R hip cellulitis 1/08,3/08    Chronic fatigue, unspecified    Chronic hyperglycemia 06/29/2020   Chronic right-sided low back pain without sciatica 07/21/2016   Chronic sinusitis 07/10/2021   Deviated septum 02/17/2021   Diverticulitis    Essential hypertension 05/17/2006   2014     Impression  Exercise Capacity:  Lexiscan with low level exercise.  BP Response:  Normal blood pressure response.  Clinical Symptoms:  No significant symptoms noted.  ECG Impression:  No significant ST segment change suggestive of ischemia.  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   Flexural eczema 07/24/2017   Generalized abdominal tenderness with rebound tenderness 08/02/2022   GERD (gastroesophageal reflux disease)    Gout    Hashimoto's thyroiditis    History of skin cancer    Melanoma on back   Hypogonadism male    low T, a/w ED   Hypothyroidism 11/11/2018   Idiopathic urticaria    Insomnia 11/10/2018   Left flank pain 08/02/2022   Left thyroid nodule 2008   on [place], decrease size in 2011 [place]   Lung nodule 07/25/2021   Migraines    Atypical and ocular   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   09/2015 compliance report> > 4 hours 80% of days, used 25/30 days   Primary osteoarthritis 05/17/2006   Rash 07/22/2022   Reactive thrombocytosis 09/17/2021   Recurrent genital HSV (herpes simplex virus) infection 11/12/2018   Rib fracture 07/22/2022   Superior  mesenteric artery aneurysm 07/12/2015   DEC 2019     IMPRESSION:  VASCULAR     1. No acute findings.  2. 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  3. Ectatic abdominal aorta at risk for aneurysm development.  Recommend followup by ultrasound in 5 years. This recommendation  follows ACR consensus guidelines: White Paper of the ACR Incidental  Findings Committee II on Vascular Findings. J Am Coll Radiol 2013;  [id number].  4. 1.   Symptomatic PVCs 09/06/2012   Past Surgical History:  Procedure Laterality Date   CARPAL TUNNEL RELEASE Left 03/30/2017   Procedure: CARPAL TUNNEL RELEASE;  Surgeon: Valeria Batman, MD;  Location: [facility];  Service: Orthopedics;  Laterality: Left;   CHOLECYSTECTOMY  2004   CTR Bilateral 1982   CYSTOSCOPY  1974   FOOT FUSION Left 06/22/2017   HEMORRHOID SURGERY N/A 03/30/2017   Procedure: HEMORRHOIDECTOMY;  Surgeon: Abigail Miyamoto, MD;  Location: [facility];  Service: General;  Laterality: N/A;   open reduction L little finger  Left 1970   Repair digital thumb, left Left 1990   Right shoulder cuff repair Right 09/15/11   Right shoulder SAD, DCR Right 02/05/11   TOTAL HIP ARTHROPLASTY Left 07/26/06   TOTAL HIP ARTHROPLASTY Right 1999   TOTAL HIP REVISION Left 03/07/2019   Procedure: LEFT TOTAL HIP REVISION POSTERIOR APPROACH;  Surgeon: Wendolyn Hamburger, MD;  Location: [facility];  Service: Orthopedics;  Laterality: Left;   Scheduled Meds:  enoxaparin (LOVENOX) injection  60 mg Subcutaneous Q24H   feeding supplement  237 mL Oral BID BM   irbesartan  300 mg Oral Daily   And   hydrochlorothiazide  25 mg Oral Daily   levothyroxine  175 mcg Oral QAC breakfast   memantine  10 mg Oral BID   rosuvastatin  5 mg Oral Daily   terbinafine  250 mg Oral Daily   Continuous Infusions:   ceFAZolin (ANCEF) IV 2 g (06/02/23 0546)   PRN Meds:.acetaminophen, ondansetron (ZOFRAN) IV, polyethylene glycol, traZODone  Allergies  Allergen  Reactions   Nsaids Shortness Of Breath    Naproxen specifically causes SOB/wheeze tolerates topical diclofenac without problem Tylenol OK   Tolmetin Shortness Of Breath    Naproxen specifically causes SOB/wheeze tolerates topical diclofenac without problem Tylenol OK   Social History   Socioeconomic History   Marital status: Married    Spouse name: Cain Castillo   Number of children: 3   Years of education: 18   Highest education level: Not on file  Occupational History   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   Social Drivers of Corporate investment banker Strain: Low Risk  (02/19/2023)   Overall Financial Resource Strain (CARDIA)    Difficulty of Paying Living Expenses: Not hard at all  Food Insecurity: No Food Insecurity (06/02/2023)   Hunger Vital Sign    Worried About Running Out of Food in the Last Year: Never true    Ran Out of Food in the Last Year: Never true  Transportation Needs: No Transportation Needs (06/02/2023)   PRAPARE - Administrator, Civil Service (Medical): No    Lack of Transportation (Non-Medical): No  Physical Activity: Sufficiently Active (02/19/2023)   Exercise Vital Sign    Days of Exercise per Week: 7 days    Minutes of Exercise per Session: 40 min  Stress: No Stress Concern Present (02/19/2023)   [employer] of Occupational Health - Occupational Stress Questionnaire    Feeling of Stress : Not at all  Social Connections: Unknown (06/02/2023)   Social Connection and Isolation Panel  [NHANES]    Frequency of Communication with Friends and Family: Twice a week    Frequency of Social Gatherings with Friends and Family: Twice a week    Attends Religious Services: 1 to 4 times per year    Active Member of [employer] or Organizations: Patient declined    Attends Banker Meetings:  Patient declined    Marital Status: Married  Catering manager Violence: Not At Risk (06/02/2023)   Humiliation, Afraid, Rape, and Kick questionnaire    Fear of Current or Ex-Partner: No    Emotionally Abused: No    Physically Abused: No    Sexually Abused: No   Family History  Problem Relation Age of Onset   Arthritis Father    Heart disease Father    Diabetes Father    Vascular Disease Father        AoBifem   Arthritis Mother    Diabetes Mother    Multiple sclerosis Mother    Cancer Paternal Grandmother        "GI"   Cancer Paternal Grandfather        stomach cancer   Thyroid cancer Sister    Uterine cancer Sister    Vitals BP (!) 108/57 (BP Location: Right Arm)   Pulse (!) 55   Temp 98.2 F (36.8 C) (Oral)   Resp 18   Ht 5\' 10"  (1.778 m)   Wt 109.9 kg   SpO2 96%   BMI 34.76 kg/m    Physical Exam Constitutional: Elderly male sitting at the edge of the bed, nontoxic-appearing    Comments: HEENT WNL  Cardiovascular:     Rate and Rhythm: Normal rate and regular rhythm.     Heart sounds: S1 and S2  Pulmonary:     Effort: Pulmonary effort is normal.     Comments: Normal breath sounds  Abdominal:     Palpations: Abdomen is soft.     Tenderness: Nontender and nondistended  Musculoskeletal:        General: No swelling or tenderness in peripheral joints.  Right leg swollen, some erythema, mild tenderness, no crepitus or fluctuance, range of motion of right knee and right ankle is good, no open wounds.  Left leg with no signs of cellulitis  Skin:    Comments: No rashes  Neurological:     General:, Alert and oriented, grossly nonfocal  Psychiatric:         Mood and Affect: Mood normal.    Pertinent Microbiology Results for orders placed or performed during the hospital encounter of 05/26/23  Culture, blood (Routine x 2)     Status: None   Collection Time: 05/26/23 11:45 PM   Specimen: BLOOD RIGHT ARM  Result Value Ref Range Status   Specimen Description BLOOD RIGHT ARM  Final   Special Requests   Final    BOTTLES DRAWN AEROBIC AND ANAEROBIC Blood Culture results may not be optimal due to an inadequate volume of blood received in culture bottles   Culture   Final    NO GROWTH 5 DAYS Performed at [facility], [address], [place], [place] [address]    Report Status 06/01/2023 FINAL  Final  Culture, blood (Routine x 2)     Status: None   Collection Time: 05/27/23 12:05 AM   Specimen: BLOOD RIGHT HAND  Result Value Ref Range Status   Specimen Description BLOOD RIGHT HAND  Final   Special Requests   Final    BOTTLES DRAWN AEROBIC ONLY Blood Culture results may not be optimal due to an inadequate volume of blood received in culture bottles   Culture   Final    NO GROWTH 5 DAYS Performed at [facility], [address] [place], [place] [address]    Report Status 06/01/2023 FINAL  Final  Resp panel by RT-PCR (RSV, Flu  A&B, Covid) Anterior Nasal Swab     Status: None   Collection Time: 05/27/23  1:15 AM   Specimen: Anterior Nasal Swab  Result Value Ref Range Status   SARS Coronavirus 2 by RT PCR NEGATIVE NEGATIVE Final   Influenza A by PCR NEGATIVE NEGATIVE Final   Influenza B by PCR NEGATIVE NEGATIVE Final    Comment: (NOTE) The Xpert Xpress SARS-CoV-2/FLU/RSV plus assay is intended as an aid in the diagnosis of influenza from Nasopharyngeal swab specimens and should not be used as a sole basis for treatment. Nasal washings and aspirates are unacceptable for Xpert Xpress SARS-CoV-2/FLU/RSV testing.  Fact Sheet for Patients: BloggerCourse.com  Fact Sheet for Healthcare  Providers: SeriousBroker.it  This test is not yet approved or cleared by the [place]  FDA and has been authorized for detection and/or diagnosis of SARS-CoV-2 by FDA under an Emergency Use Authorization (EUA). This EUA will remain in effect (meaning this test can be used) for the duration of the COVID-19 declaration under Section 564(b)(1) of the Act, 21 U.S.C. section 360bbb-3(b)(1), unless the authorization is terminated or revoked.     Resp Syncytial Virus by PCR NEGATIVE NEGATIVE Final    Comment: (NOTE) Fact Sheet for Patients: BloggerCourse.com  Fact Sheet for Healthcare Providers: SeriousBroker.it  This test is not yet approved or cleared by the [place]  FDA and has been authorized for detection and/or diagnosis of SARS-CoV-2 by FDA under an Emergency Use Authorization (EUA). This EUA will remain in effect (meaning this test can be used) for the duration of the COVID-19 declaration under Section 564(b)(1) of the Act, 21 U.S.C. section 360bbb-3(b)(1), unless the authorization is terminated or revoked.  Performed at [facility], [address], [place], [place] [address]   MRSA Next Gen by PCR, Nasal     Status: None   Collection Time: 05/28/23 10:34 AM   Specimen: Nasal Mucosa; Nasal Swab  Result Value Ref Range Status   MRSA by PCR Next Gen NOT DETECTED NOT DETECTED Final    Comment: (NOTE) The GeneXpert MRSA Assay (FDA approved for NASAL specimens only), is one component of a comprehensive MRSA colonization surveillance program. It is not intended to diagnose MRSA infection nor to guide or monitor treatment for MRSA infections. Test performance is not FDA approved in patients less than 42 years old. Performed at [facility], [address], [place], [place] [address]    Pertinent Lab seen by me:    Latest Ref Rng & Units 06/02/2023    4:35 AM 06/01/2023    8:06 PM  05/31/2023    6:18 AM  CBC  WBC 4.0 - 10.5 K/uL 8.0  8.3  9.1   Hemoglobin 13.0 - 17.0 g/dL 86.5  78.4  69.6   Hematocrit 39.0 - 52.0 % 40.5  44.0  40.3   Platelets 150 - 400 K/uL 357  388  293       Latest Ref Rng & Units 06/02/2023    4:35 AM 06/01/2023    8:06 PM 05/28/2023    6:46 AM  CMP  Glucose 70 - 99 mg/dL 295  284  132   BUN 8 - 23 mg/dL 13  14  14    Creatinine 0.61 - 1.24 mg/dL 4.40  1.02  7.25   Sodium 135 - 145 mmol/L 139  137  136   Potassium 3.5 - 5.1 mmol/L 3.8  3.1  3.8   Chloride 98 - 111 mmol/L 103  100  101  CO2 22 - 32 mmol/L 28  26  24    Calcium 8.9 - 10.3 mg/dL 8.5  8.6  9.2   Total Protein 6.5 - 8.1 g/dL   7.0   Total Bilirubin 0.0 - 1.2 mg/dL   1.5   Alkaline Phos 38 - 126 U/L   54   AST 15 - 41 U/L   28   ALT 0 - 44 U/L   23      Pertinent Imagings/Other Imagings Plain films and CT images have been personally visualized and interpreted; radiology reports have been reviewed. Decision making incorporated into the Impression / Recommendations.  VAS [place] LOWER EXTREMITY VENOUS (DVT) Result Date: 05/28/2023  Lower Venous DVT Study Patient Name:  HANS RUSHER  Date of Exam:   05/27/2023 Medical Rec #: [id number]         Accession #:    [id number] Date of Birth: 1950-08-20         Patient Gender: M Patient Age:   72 years Exam Location:  [facility] Procedure:      VAS [place] LOWER EXTREMITY VENOUS (DVT) Referring Phys: Enid Derry HALL --------------------------------------------------------------------------------  Indications: Pain, Swelling, Erythema, and Cellulitis.  Limitations: Edema, pain with compression maneuver and body habitus. Comparison Study: No prior right LEV on file Performing Technologist: Sherren Kerns RVS  Examination Guidelines: A complete evaluation includes B-mode imaging, spectral Doppler, color Doppler, and power Doppler as needed of all accessible portions of each vessel. Bilateral testing is considered an integral part of a complete  examination. Limited examinations for reoccurring indications may be performed as noted. The reflux portion of the exam is performed with the patient in reverse Trendelenburg.  +---------+---------------+---------+-----------+----------+-------------------+ RIGHT    CompressibilityPhasicitySpontaneityPropertiesThrombus Aging      +---------+---------------+---------+-----------+----------+-------------------+ CFV      Full           Yes      Yes                                      +---------+---------------+---------+-----------+----------+-------------------+ SFJ      Full                                                             +---------+---------------+---------+-----------+----------+-------------------+ FV Prox  Full                                                             +---------+---------------+---------+-----------+----------+-------------------+ FV Mid   Full                                                             +---------+---------------+---------+-----------+----------+-------------------+ FV DistalFull                                                             +---------+---------------+---------+-----------+----------+-------------------+  PFV      Full                                                             +---------+---------------+---------+-----------+----------+-------------------+ POP      Full           Yes      Yes                                      +---------+---------------+---------+-----------+----------+-------------------+ PTV                                                   patent by color     +---------+---------------+---------+-----------+----------+-------------------+ PERO                                                  Not well visualized +---------+---------------+---------+-----------+----------+-------------------+    +----+---------------+---------+-----------+----------+--------------+ LEFTCompressibilityPhasicitySpontaneityPropertiesThrombus Aging +----+---------------+---------+-----------+----------+--------------+ CFV Full           Yes      Yes                                 +----+---------------+---------+-----------+----------+--------------+    Summary: RIGHT: - There is no evidence of deep vein thrombosis in the lower extremity. However, portions of this examination were limited- see technologist comments above.  - No cystic structure found in the popliteal fossa. - Ultrasound characteristics of enlarged lymph nodes are noted in the groin.  LEFT: - No evidence of common femoral vein obstruction.   *See table(s) above for measurements and observations. Electronically signed by Genny Kid MD on 05/28/2023 at 1:30:29 PM.    Final    ECHOCARDIOGRAM COMPLETE Result Date: 05/27/2023    ECHOCARDIOGRAM REPORT   Patient Name:   FARLEY CROOKER Date of Exam: 05/27/2023 Medical Rec #:  [id number]        Height:       70.0 in Accession #:    [id number]       Weight:       270.1 lb Date of Birth:  [birth year]        BSA:          2.372 m Patient Age:    72 years         BP:           104/61 mmHg Patient Gender: M                HR:           76 bpm. Exam Location:  Inpatient Procedure: 2D Echo, Cardiac Doppler, Color Doppler and Strain Analysis (Both            Spectral and Color Flow Doppler were utilized during procedure). Indications:    Elevated Troponin  History:        Patient has prior history of Echocardiogram examinations, most  recent 12/27/2018. Risk Factors:Hypertension, GERD and Sleep                 Apnea. Hypothyroidism.  Sonographer:    Travis Friedman RDCS Referring Phys: [id number] CAROLE N HALL IMPRESSIONS  1. Left ventricular ejection fraction, by estimation, is 60 to 65%. The left ventricle has normal function. The left ventricle has no regional wall motion abnormalities. There is moderate  left ventricular hypertrophy. Left ventricular diastolic parameters are consistent with Grade I diastolic dysfunction (impaired relaxation). The average left ventricular global longitudinal strain is -21.3 %. The global longitudinal strain is normal.  2. Right ventricular systolic function is normal. The right ventricular size is normal. Tricuspid regurgitation signal is inadequate for assessing PA pressure.  3. The mitral valve is grossly normal. Trivial mitral valve regurgitation.  4. The aortic valve is tricuspid. Aortic valve regurgitation is not visualized.  5. Aortic dilatation noted. There is borderline dilatation of the ascending aorta, measuring 38 mm.  6. The inferior vena cava is dilated in size with >50% respiratory variability, suggesting right atrial pressure of 8 mmHg. FINDINGS  Left Ventricle: Left ventricular ejection fraction, by estimation, is 60 to 65%. The left ventricle has normal function. The left ventricle has no regional wall motion abnormalities. The average left ventricular global longitudinal strain is -21.3 %. Strain was performed and the global longitudinal strain is normal. The left ventricular internal cavity size was normal in size. There is moderate left ventricular hypertrophy. Left ventricular diastolic parameters are consistent with Grade I diastolic dysfunction (impaired relaxation). Indeterminate filling pressures. Right Ventricle: The right ventricular size is normal. No increase in right ventricular wall thickness. Right ventricular systolic function is normal. Tricuspid regurgitation signal is inadequate for assessing PA pressure. Left Atrium: Left atrial size was normal in size. Right Atrium: Right atrial size was normal in size. Pericardium: There is no evidence of pericardial effusion. Mitral Valve: The mitral valve is grossly normal. Trivial mitral valve regurgitation. Tricuspid Valve: The tricuspid valve is normal in structure. Tricuspid valve regurgitation is not  demonstrated. Aortic Valve: The aortic valve is tricuspid. Aortic valve regurgitation is not visualized. Aortic valve mean gradient measures 7.0 mmHg. Aortic valve peak gradient measures 12.1 mmHg. Aortic valve area, by VTI measures 3.73 cm. Pulmonic Valve: The pulmonic valve was normal in structure. Pulmonic valve regurgitation is not visualized. Aorta: Aortic dilatation noted. There is borderline dilatation of the ascending aorta, measuring 38 mm. Venous: The inferior vena cava is dilated in size with greater than 50% respiratory variability, suggesting right atrial pressure of 8 mmHg. IAS/Shunts: No atrial level shunt detected by color flow Doppler.  LEFT VENTRICLE PLAX 2D LVIDd:         4.30 cm   Diastology LVIDs:         3.30 cm   LV e' medial:    6.13 cm/s LV PW:         1.10 cm   LV E/e' medial:  9.9 LV IVS:        1.30 cm   LV e' lateral:   8.16 cm/s LVOT diam:     2.20 cm   LV E/e' lateral: 7.4 LV SV:         120 LV SV Index:   50        2D Longitudinal Strain LVOT Area:     3.80 cm  2D Strain GLS (A4C):   -21.2 %  2D Strain GLS (A3C):   -22.0 %                          2D Strain GLS (A2C):   -20.8 %                          2D Strain GLS Avg:     -21.3 % RIGHT VENTRICLE             IVC RV Basal diam:  4.50 cm     IVC diam: 2.20 cm RV Mid diam:    2.20 cm RV S prime:     17.40 cm/s TAPSE (M-mode): 2.7 cm LEFT ATRIUM             Index        RIGHT ATRIUM           Index LA diam:        4.10 cm 1.73 cm/m   RA Area:     16.10 cm LA Vol (A2C):   82.1 ml 34.62 ml/m  RA Volume:   43.20 ml  18.22 ml/m LA Vol (A4C):   47.4 ml 19.99 ml/m LA Biplane Vol: 62.2 ml 26.23 ml/m  AORTIC VALVE AV Area (Vmax):    3.47 cm AV Area (Vmean):   3.41 cm AV Area (VTI):     3.73 cm AV Vmax:           174.00 cm/s AV Vmean:          118.000 cm/s AV VTI:            0.321 m AV Peak Grad:      12.1 mmHg AV Mean Grad:      7.0 mmHg LVOT Vmax:         159.00 cm/s LVOT Vmean:        106.000 cm/s LVOT VTI:           0.315 m LVOT/AV VTI ratio: 0.98  AORTA Ao Root diam: 3.40 cm Ao Asc diam:  3.60 cm MITRAL VALVE MV Area (PHT): 2.87 cm    SHUNTS MV Decel Time: 264 msec    Systemic VTI:  0.32 m MV E velocity: 60.40 cm/s  Systemic Diam: 2.20 cm MV A velocity: 72.70 cm/s MV E/A ratio:  0.83 Dinah Franco MD Electronically signed by Dinah Franco MD Signature Date/Time: 05/27/2023/5:03:43 PM    Final    CT Angio Chest PE W and/or Wo Contrast Result Date: 05/27/2023 CLINICAL DATA:  Shortness of breath, sepsis, and left lower quadrant pain. EXAM: CT ANGIOGRAPHY CHEST CT ABDOMEN AND PELVIS WITH CONTRAST TECHNIQUE: Multidetector CT imaging of the chest was performed using the standard protocol during bolus administration of intravenous contrast. Multiplanar CT image reconstructions and MIPs were obtained to evaluate the vascular anatomy. Multidetector CT imaging of the abdomen and pelvis was performed using the standard protocol during bolus administration of intravenous contrast. RADIATION DOSE REDUCTION: This exam was performed according to the departmental dose-optimization program which includes automated exposure control, adjustment of the mA and/or kV according to patient size and/or use of iterative reconstruction technique. CONTRAST:  75mL OMNIPAQUE IOHEXOL 350 MG/ML SOLN COMPARISON:  PA lateral chest today, PA chest with rib series 05/10/2023, CT chest, abdomen and pelvis with IV contrast 05/10/2023, and CTA chest 06/24/2022. FINDINGS: CTA CHEST FINDINGS Cardiovascular: The pulmonary arteries are normal caliber and do not show evidence of embolic filling defects. The cardiac  size is normal. There are scattered three-vessel calcifications in the coronary arteries. There is no pericardial effusion, no venous dilatation. There is mild aortic tortuosity and atherosclerosis without aneurysm, dissection or stenosis. The great vessels are clear. Mediastinum/Nodes: No enlarged mediastinal, hilar, or axillary lymph nodes. Thyroid  gland, trachea, and esophagus demonstrate no significant findings. Lungs/Pleura: Small area with chronic branching micronodular disease lateral left upper lobe, findings consistent with small airways impactions. There is increased linear scarring or atelectasis in the right lower lobe interval decreased atelectasis in the right middle lobe. Small airway impactions also chronically noted in the posterior right upper lobe left apex. There is no consolidation, effusion or pneumothorax.  Next Musculoskeletal: Multilevel thoracic spine bridging enthesopathy, degenerative discs, spondylosis, and thoracic kyphosis. No acute or other significant osseous findings. There are healed fracture deformities of some of the anterolateral left ribs. Bilateral discoid gynecomastia appears similar. There is eventration and mild chronic elevation of the anterior right diaphragm. No chest wall mass is seen. Review of the MIP images confirms the above findings. CT ABDOMEN and PELVIS FINDINGS Hepatobiliary: The liver is 18 cm in length mildly steatotic. Status post cholecystectomy with no biliary dilatation. Fall Pancreas: No abnormality. Spleen: No abnormality. Adrenals/Urinary Tract: There is no adrenal or renal mass enhancement. There are small Bosniak 1 cysts in the inferior pole of the kidneys but no mass enhancement of the kidneys. There is no urinary stone or obstruction. There are few renovascular calcifications at the left renal hilum. The lower pelvic ureters and the bladder are all obscured due to extensive metallic artifact from bilateral hip replacements. Stomach/Bowel: Unremarkable contracted stomach. Normal caliber small bowel without wall thickening or inflammatory change. Normal subcecal appendix. There is colonic diverticulosis without evidence of wall thickening or diverticulitis. Vascular/Lymphatic: Aortic atherosclerosis. No enlarged abdominal or pelvic lymph nodes. Reproductive: Prostate obscured by the hip  replacements. Other: Small inguinal fat hernias. Small umbilical fat hernia. No incarcerated hernia. No free fluid, free air or focal inflammatory process. Pelvic phleboliths. Musculoskeletal: Chronic bilateral hip replacements. Advanced degenerative change lumbar spine. Ankylosis superior left SI joint. Acquired spinal stenosis L4-5. Review of the MIP images confirms the above findings. IMPRESSION: 1. No evidence of arterial dilatation, embolus or other acute chest CT or CTA findings. 2. Aortic and coronary artery atherosclerosis. 3. 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 4. No acute CT findings in the abdomen or pelvis. 5. Diverticulosis without evidence of diverticulitis. 6. Small umbilical and inguinal fat hernias. 7. Mild hepatic steatosis. 8. Chronic bilateral hip replacements with extensive metallic artifact obscuring the lower pelvic ureters, prostate and bladder. 9. Acquired spinal stenosis L4-5. 10. Discoid gynecomastia. Electronically Signed   By: Denman Fischer M.D.   On: 05/27/2023 02:29   CT ABDOMEN PELVIS W CONTRAST Result Date: 05/27/2023 CLINICAL DATA:  Shortness of breath, sepsis, and left lower quadrant pain. EXAM: CT ANGIOGRAPHY CHEST CT ABDOMEN AND PELVIS WITH CONTRAST TECHNIQUE: Multidetector CT imaging of the chest was performed using the standard protocol during bolus administration of intravenous contrast. Multiplanar CT image reconstructions and MIPs were obtained to evaluate the vascular anatomy. Multidetector CT imaging of the abdomen and pelvis was performed using the standard protocol during bolus administration of intravenous contrast. RADIATION DOSE REDUCTION: This exam was performed according to the departmental dose-optimization program which includes automated exposure control, adjustment of the mA and/or kV according to patient size and/or use of iterative reconstruction technique. CONTRAST:  75mL OMNIPAQUE IOHEXOL 350 MG/ML SOLN COMPARISON:  PA lateral chest  today,  PA chest with rib series 05/10/2023, CT chest, abdomen and pelvis with IV contrast 05/10/2023, and CTA chest 06/24/2022. FINDINGS: CTA CHEST FINDINGS Cardiovascular: The pulmonary arteries are normal caliber and do not show evidence of embolic filling defects. The cardiac size is normal. There are scattered three-vessel calcifications in the coronary arteries. There is no pericardial effusion, no venous dilatation. There is mild aortic tortuosity and atherosclerosis without aneurysm, dissection or stenosis. The great vessels are clear. Mediastinum/Nodes: No enlarged mediastinal, hilar, or axillary lymph nodes. Thyroid gland, trachea, and esophagus demonstrate no significant findings. Lungs/Pleura: Small area with chronic branching micronodular disease lateral left upper lobe, findings consistent with small airways impactions. There is increased linear scarring or atelectasis in the right lower lobe interval decreased atelectasis in the right middle lobe. Small airway impactions also chronically noted in the posterior right upper lobe left apex. There is no consolidation, effusion or pneumothorax.  Next Musculoskeletal: Multilevel thoracic spine bridging enthesopathy, degenerative discs, spondylosis, and thoracic kyphosis. No acute or other significant osseous findings. There are healed fracture deformities of some of the anterolateral left ribs. Bilateral discoid gynecomastia appears similar. There is eventration and mild chronic elevation of the anterior right diaphragm. No chest wall mass is seen. Review of the MIP images confirms the above findings. CT ABDOMEN and PELVIS FINDINGS Hepatobiliary: The liver is 18 cm in length mildly steatotic. Status post cholecystectomy with no biliary dilatation. Fall Pancreas: No abnormality. Spleen: No abnormality. Adrenals/Urinary Tract: There is no adrenal or renal mass enhancement. There are small Bosniak 1 cysts in the inferior pole of the kidneys but no mass enhancement of  the kidneys. There is no urinary stone or obstruction. There are few renovascular calcifications at the left renal hilum. The lower pelvic ureters and the bladder are all obscured due to extensive metallic artifact from bilateral hip replacements. Stomach/Bowel: Unremarkable contracted stomach. Normal caliber small bowel without wall thickening or inflammatory change. Normal subcecal appendix. There is colonic diverticulosis without evidence of wall thickening or diverticulitis. Vascular/Lymphatic: Aortic atherosclerosis. No enlarged abdominal or pelvic lymph nodes. Reproductive: Prostate obscured by the hip replacements. Other: Small inguinal fat hernias. Small umbilical fat hernia. No incarcerated hernia. No free fluid, free air or focal inflammatory process. Pelvic phleboliths. Musculoskeletal: Chronic bilateral hip replacements. Advanced degenerative change lumbar spine. Ankylosis superior left SI joint. Acquired spinal stenosis L4-5. Review of the MIP images confirms the above findings. IMPRESSION: 1. No evidence of arterial dilatation, embolus or other acute chest CT or CTA findings. 2. Aortic and coronary artery atherosclerosis. 3. 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 4. No acute CT findings in the abdomen or pelvis. 5. Diverticulosis without evidence of diverticulitis. 6. Small umbilical and inguinal fat hernias. 7. Mild hepatic steatosis. 8. Chronic bilateral hip replacements with extensive metallic artifact obscuring the lower pelvic ureters, prostate and bladder. 9. Acquired spinal stenosis L4-5. 10. Discoid gynecomastia. Electronically Signed   By: Denman Fischer M.D.   On: 05/27/2023 02:29   DG Chest 2 View Result Date: 05/27/2023 CLINICAL DATA:  Shortness of breath, dry cough, chest pain. EXAM: CHEST - 2 VIEW COMPARISON:  Rib series and PA chest 05/10/2023 FINDINGS: The heart size and mediastinal contours are within normal limits. There is a low inspiration, with exaggerated AP chest  diameter. The lungs are clear of infiltrates. The visualized skeletal structures are intact with osteopenia and thoracic kyphosis and spondylosis. IMPRESSION: Low inspiration. No evidence of acute chest disease or interval changes. Electronically Signed  By: Denman Fischer M.D.   On: 05/27/2023 00:23   CT CHEST ABDOMEN PELVIS W CONTRAST Result Date: 05/10/2023 CLINICAL DATA:  Blunt polytrauma, pain in left side of chest/ribs and right elbow after fall. History of chest fracture on the left side. EXAM: CT CHEST, ABDOMEN, AND PELVIS WITH CONTRAST TECHNIQUE: Multidetector CT imaging of the chest, abdomen and pelvis was performed following the standard protocol during bolus administration of intravenous contrast. RADIATION DOSE REDUCTION: This exam was performed according to the departmental dose-optimization program which includes automated exposure control, adjustment of the mA and/or kV according to patient size and/or use of iterative reconstruction technique. CONTRAST:  OMNIPAQUE IOHEXOL 300 MG/ML  SOLN COMPARISON:  Same day rib radiographs; CT abdomen pelvis 12/06/2022; CTA chest 06/24/2022 FINDINGS: CT CHEST FINDINGS Cardiovascular: No pericardial effusion. No evidence of acute aortic injury. Coronary artery and aortic atherosclerotic calcification. Mediastinum/Nodes: Trachea and esophagus are unremarkable. No mediastinal hematoma. Lungs/Pleura: No focal consolidation, pleural effusion, or pneumothorax. Musculoskeletal: No acute fracture. CT ABDOMEN PELVIS FINDINGS Hepatobiliary: Hepatic steatosis. Cholecystectomy. No acute abnormality. Pancreas: Unremarkable. Spleen: No acute injury. Adrenals/Urinary Tract: Unremarkable adrenal glands. No renal injury. No hydronephrosis. The bladder is obscured by streak artifact. Stomach/Bowel: No bowel obstruction or bowel wall thickening. Stomach and appendix are within normal limits. Vascular/Lymphatic: Aortic atherosclerotic calcification. No lymphadenopathy.  Reproductive: Obscured by streak artifact. Other: No free intraperitoneal fluid or air. Musculoskeletal: Bilateral hip arthroplasties. IMPRESSION: 1. No evidence of acute traumatic injury in the chest, abdomen, or pelvis. 2. Hepatic steatosis. 3. Aortic Atherosclerosis (ICD10-I70.0). Electronically Signed   By: Rozell Cornet M.D.   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 Left anterior rib pain. Left shoulder pain. Right elbow abrasion and active bleeding. EXAM: LEFT RIBS AND CHEST - 3+ VIEW COMPARISON:  07/12/2022 FINDINGS: Normal cardiomediastinal silhouette. No focal consolidation, pleural effusion, or pneumothorax. No displaced rib fractures. Chronic left anterior seventh rib fracture. IMPRESSION: No acute cardiopulmonary disease. Electronically Signed   By: Rozell Cornet M.D.   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 Left anterior rib pain. Left shoulder pain. Right elbow abrasion and active bleeding. EXAM: RIGHT ELBOW - COMPLETE 3+ VIEW COMPARISON:  None Available. FINDINGS: No acute fracture or dislocation. No elbow joint effusion. Degenerative arthritis with chronic degenerative fragments about the medial epicondyle and coronoid process. Small olecranon enthesophyte. IMPRESSION: No acute fracture or dislocation. Electronically Signed   By: Rozell Cornet M.D.   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 Left anterior rib pain. Left shoulder pain. Right elbow abrasion and active bleeding. EXAM: LEFT SCAPULA - 2+ VIEWS COMPARISON:  07/12/2022 FINDINGS: No acute fracture or dislocation. Degenerative arthritis glenohumeral and AC joints. IMPRESSION: No acute fracture or dislocation. Electronically Signed   By: Rozell Cornet M.D.   On:  05/10/2023 20:08     I have personally spent 85 minutes involved in face-to-face and non-face-to-face activities for this patient on the day of the visit. Professional time spent includes the following activities: Preparing to see the patient (review of tests), Obtaining and/or reviewing separately obtained history (admission/discharge record), Performing a medically appropriate examination and/or evaluation , Ordering medications/tests/procedures, referring and communicating with other health care professionals, Documenting clinical information in the EMR,  Independently interpreting results (not separately reported), Communicating results to the patient/family/caregiver, Counseling and educating the patient/family/caregiver and Care coordination (not separately reported).  Electronically signed by:   Plan d/w requesting provider as well as ID pharm D  Of note, portions of this note may have been created with voice recognition software. While this note has been edited for accuracy, occasional wrong-word or 'sound-a-like' substitutions may have occurred due to the inherent limitations of voice recognition software.   Terre Ferri, MD Infectious Disease Physician [facility] Pager: [phone]

## 2023-06-02 NOTE — Plan of Care (Signed)

## 2023-06-02 NOTE — Plan of Care (Signed)
   Problem: Education: Goal: Knowledge of General Education information will improve Description: Including pain rating scale, medication(s)/side effects and non-pharmacologic comfort measures Outcome: Progressing   Problem: Activity: Goal: Risk for activity intolerance will decrease Outcome: Progressing   Problem: Pain Managment: Goal: General experience of comfort will improve and/or be controlled Outcome: Progressing

## 2023-06-03 ENCOUNTER — Encounter (HOSPITAL_COMMUNITY)

## 2023-06-03 ENCOUNTER — Inpatient Hospital Stay (HOSPITAL_COMMUNITY)

## 2023-06-03 DIAGNOSIS — L03115 Cellulitis of right lower limb: Principal | ICD-10-CM

## 2023-06-03 DIAGNOSIS — L0390 Cellulitis, unspecified: Secondary | ICD-10-CM | POA: Diagnosis not present

## 2023-06-03 LAB — CBC
HCT: 44.8 % (ref 39.0–52.0)
Hemoglobin: 14.5 g/dL (ref 13.0–17.0)
MCH: 30.2 pg (ref 26.0–34.0)
MCHC: 32.4 g/dL (ref 30.0–36.0)
MCV: 93.3 fL (ref 80.0–100.0)
Platelets: 430 10*3/uL — ABNORMAL HIGH (ref 150–400)
RBC: 4.8 MIL/uL (ref 4.22–5.81)
RDW: 12.4 % (ref 11.5–15.5)
WBC: 6.8 10*3/uL (ref 4.0–10.5)
nRBC: 0 % (ref 0.0–0.2)

## 2023-06-03 LAB — COMPREHENSIVE METABOLIC PANEL WITH GFR
ALT: 31 U/L (ref 0–44)
AST: 29 U/L (ref 15–41)
Albumin: 3.1 g/dL — ABNORMAL LOW (ref 3.5–5.0)
Alkaline Phosphatase: 55 U/L (ref 38–126)
Anion gap: 8 (ref 5–15)
BUN: 12 mg/dL (ref 8–23)
CO2: 30 mmol/L (ref 22–32)
Calcium: 8.9 mg/dL (ref 8.9–10.3)
Chloride: 99 mmol/L (ref 98–111)
Creatinine, Ser: 1.06 mg/dL (ref 0.61–1.24)
GFR, Estimated: >60 mL/min (ref >60)
Glucose, Bld: 99 mg/dL (ref 70–99)
Potassium: 3.7 mmol/L (ref 3.5–5.1)
Sodium: 137 mmol/L (ref 135–145)
Total Bilirubin: 0.8 mg/dL (ref 0.0–1.2)
Total Protein: 7 g/dL (ref 6.5–8.1)

## 2023-06-03 LAB — MAGNESIUM: Magnesium: 2.2 mg/dL (ref 1.7–2.4)

## 2023-06-03 NOTE — Plan of Care (Signed)

## 2023-06-03 NOTE — Progress Notes (Signed)
 PROGRESS NOTE  RYVER POBLETE ZOX:[id number] DOB: 02/28/1950 DOA: 06/01/2023 PCP: Arcadio Knuckles, MD   LOS: 1 day   Brief Narrative / Interim history: 73 year old male with history of hypertension, hypothyroidism, mild Alzheimer's, obesity, OSA who presents to the hospital right lower extremity redness. He was hospitalized for the same and discharged on 4/10 for right lower extremity cellulitis, felt to be refractory and followed with ID in the outpatient clinic. ID was consulted at time, he was treated with IV Rocephin and eventually transition to Keflex. Upon returning home, within 2 days patient's redness recurred, has been having subjective chills and came back to the hospital   Subjective / 24h Interval events: He is doing okay this morning, no fever or chills overnight.  Leg is hurting less and he appreciates improvement  Assesement and Plan: Principal Problem:   Recurrent cellulitis Active Problems:   Elevated troponin   Principal problem Right lower extremity cellulitis -patient's right lower extremity is erythematous, warm, tender to palpation.  Given quick recurrence when he was converted from IV to oral antibiotics, ID consulted again, appreciate input.  Continue Ancef, clinically improving   Active problems Essential hypertension-continue home medications, blood pressure is stable and acceptable   Hypothyroidism-continue Synthroid.  Follow-up TSH unremarkable  Dementia-continue memantine   Hyperlipidemia-continue statin   Hypokalemia-replace and continue to monitor, K, magnesium acceptable today   Onychomycosis-continue home terbinafine   OSA-not on CPAP   Obesity, class I-BMI 34  Scheduled Meds:  enoxaparin (LOVENOX) injection  60 mg Subcutaneous Q24H   feeding supplement  237 mL Oral BID BM   irbesartan  300 mg Oral Daily   And   hydrochlorothiazide  25 mg Oral Daily   levothyroxine  175 mcg Oral QAC breakfast   memantine  10 mg Oral BID   rosuvastatin   5 mg Oral Daily   terbinafine  250 mg Oral Daily   Continuous Infusions:   ceFAZolin (ANCEF) IV 2 g (06/03/23 0438)   PRN Meds:.acetaminophen, ondansetron (ZOFRAN) IV, polyethylene glycol, traZODone  Current Outpatient Medications  Medication Instructions   acetaminophen (TYLENOL) 650 mg, Oral, Every 6 hours PRN   cephALEXin (KEFLEX) 500 mg, Oral, 4 times daily   cetirizine (ZYRTEC) 10 mg, Oral, Daily PRN   dicyclomine (BENTYL) 10 mg, Oral, 3 times daily   levothyroxine (SYNTHROID) 175 mcg, Oral, Daily before breakfast   memantine (NAMENDA) 10 mg, Oral, 2 times daily   olmesartan-hydrochlorothiazide (BENICAR HCT) 40-25 MG tablet 1 tablet, Oral, Daily   rosuvastatin (CRESTOR) 5 mg, Oral, Daily   terbinafine (LAMISIL) 250 mg, Oral, Daily   Testosterone 1.62 % GEL 2 Pump, Topical, Every morning   traZODone (DESYREL) 100 mg, Oral, At bedtime PRN, for sleep    Diet Orders (From admission, onward)     Start     Ordered   06/02/23 0111  Diet regular Room service appropriate? Yes; Fluid consistency: Thin  Diet effective now       Question Answer Comment  Room service appropriate? Yes   Fluid consistency: Thin      06/02/23 0110            DVT prophylaxis:    Lab Results  Component Value Date   PLT 430 (H) 06/03/2023      Code Status: Limited: Do not attempt resuscitation (DNR) -DNR-LIMITED -Do Not Intubate/DNI   Family Communication: Daughter over the phone  Status is: Inpatient Remains inpatient appropriate because: IV antibiotics   Level of care: Med-Surg  Consultants:  ID  Objective: Vitals:   06/02/23 1140 06/02/23 1944 06/03/23 0410 06/03/23 1054  BP: 120/68 128/71 (!) 101/55 126/66  Pulse: 60 63 (!) 51 64  Resp: 18 17 17    Temp: 98.6 F (37 C) 98.7 F (37.1 C) 98 F (36.7 C)   TempSrc: Oral Oral Axillary   SpO2: 95% 95% 96%   Weight:      Height:        Intake/Output Summary (Last 24 hours) at 06/03/2023 1106 Last data filed at 06/02/2023  2200 Gross per 24 hour  Intake 680 ml  Output 400 ml  Net 280 ml   Wt Readings from Last 3 Encounters:  06/02/23 109.9 kg  05/26/23 122.5 kg  05/10/23 122.5 kg    Examination:  Constitutional: NAD Eyes: no scleral icterus ENMT: Mucous membranes are moist.  Neck: normal, supple Respiratory: clear to auscultation bilaterally, no wheezing, no crackles.  Cardiovascular: Regular rate and rhythm, no murmurs / rubs / gallops. No LE edema.  Abdomen: non distended, no tenderness. Bowel sounds positive.  Musculoskeletal: no clubbing / cyanosis.   Data Reviewed: I have independently reviewed following labs and imaging studies   CBC Recent Labs  Lab 05/29/23 0554 05/31/23 0618 06/01/23 2006 06/02/23 0435 06/03/23 0434  WBC 11.4* 9.1 8.3 8.0 6.8  HGB 14.3 13.7 14.4 13.3 14.5  HCT 41.8 40.3 44.0 40.5 44.8  PLT 205 293 388 357 430*  MCV 89.3 89.0 92.6 92.5 93.3  MCH 30.6 30.2 30.3 30.4 30.2  MCHC 34.2 34.0 32.7 32.8 32.4  RDW 12.9 12.6 12.5 12.6 12.4  LYMPHSABS  --   --  1.6  --   --   MONOABS  --   --  0.7  --   --   EOSABS  --   --  0.3  --   --   BASOSABS  --   --  0.0  --   --     Recent Labs  Lab 05/28/23 0646 06/01/23 2006 06/02/23 0435 06/03/23 0434  NA 136 137 139 137  K 3.8 3.1* 3.8 3.7  CL 101 100 103 99  CO2 24 26 28 30   GLUCOSE 127* 162* 109* 99  BUN 14 14 13 12   CREATININE 1.10 0.92 0.87 1.06  CALCIUM 9.2 8.6* 8.5* 8.9  AST 28  --   --  29  ALT 23  --   --  31  ALKPHOS 54  --   --  55  BILITOT 1.5*  --   --  0.8  ALBUMIN 3.3*  --   --  3.1*  MG  --  2.0 2.0 2.2  CRP  --   --  5.7*  --   DDIMER  --  0.88*  --   --   TSH  --  2.222  --   --     ------------------------------------------------------------------------------------------------------------------ Recent Labs    06/02/23 0435  CHOL 110  HDL 29*  LDLCALC 64  TRIG 83  CHOLHDL 3.8    Lab Results  Component Value Date   HGBA1C 5.3 09/08/2021    ------------------------------------------------------------------------------------------------------------------ Recent Labs    06/01/23 2006  TSH 2.222    Cardiac Enzymes No results for input(s): "CKMB", "TROPONINI", "MYOGLOBIN" in the last 168 hours.  Invalid input(s): "CK" ------------------------------------------------------------------------------------------------------------------    Component Value Date/Time   BNP 61.8 06/24/2022 1551    CBG: No results for input(s): "GLUCAP" in the last 168 hours.  Recent Results (from the past 240 hours)  Culture, blood (Routine x 2)     Status: None   Collection Time: 05/26/23 11:45 PM   Specimen: BLOOD RIGHT ARM  Result Value Ref Range Status   Specimen Description BLOOD RIGHT ARM  Final   Special Requests   Final    BOTTLES DRAWN AEROBIC AND ANAEROBIC Blood Culture results may not be optimal due to an inadequate volume of blood received in culture bottles   Culture   Final    NO GROWTH 5 DAYS Performed at [facility], [address], [place], [place] [address]    Report Status 06/01/2023 FINAL  Final  Culture, blood (Routine x 2)     Status: None   Collection Time: 05/27/23 12:05 AM   Specimen: BLOOD RIGHT HAND  Result Value Ref Range Status   Specimen Description BLOOD RIGHT HAND  Final   Special Requests   Final    BOTTLES DRAWN AEROBIC ONLY Blood Culture results may not be optimal due to an inadequate volume of blood received in culture bottles   Culture   Final    NO GROWTH 5 DAYS Performed at [facility], [address], [place], [place] [address]    Report Status 06/01/2023 FINAL  Final  Resp panel by RT-PCR (RSV, Flu A&B, Covid) Anterior Nasal Swab     Status: None   Collection Time: 05/27/23  1:15 AM   Specimen: Anterior Nasal Swab  Result Value Ref Range Status   SARS Coronavirus 2 by RT PCR NEGATIVE NEGATIVE Final   Influenza A by PCR NEGATIVE NEGATIVE Final   Influenza B by PCR NEGATIVE  NEGATIVE Final    Comment: (NOTE) The Xpert Xpress SARS-CoV-2/FLU/RSV plus assay is intended as an aid in the diagnosis of influenza from Nasopharyngeal swab specimens and should not be used as a sole basis for treatment. Nasal washings and aspirates are unacceptable for Xpert Xpress SARS-CoV-2/FLU/RSV testing.  Fact Sheet for Patients: BloggerCourse.com  Fact Sheet for Healthcare Providers: SeriousBroker.it  This test is not yet approved or cleared by the [place]  FDA and has been authorized for detection and/or diagnosis of SARS-CoV-2 by FDA under an Emergency Use Authorization (EUA). This EUA will remain in effect (meaning this test can be used) for the duration of the COVID-19 declaration under Section 564(b)(1) of the Act, 21 U.S.C. section 360bbb-3(b)(1), unless the authorization is terminated or revoked.     Resp Syncytial Virus by PCR NEGATIVE NEGATIVE Final    Comment: (NOTE) Fact Sheet for Patients: BloggerCourse.com  Fact Sheet for Healthcare Providers: SeriousBroker.it  This test is not yet approved or cleared by the [place]  FDA and has been authorized for detection and/or diagnosis of SARS-CoV-2 by FDA under an Emergency Use Authorization (EUA). This EUA will remain in effect (meaning this test can be used) for the duration of the COVID-19 declaration under Section 564(b)(1) of the Act, 21 U.S.C. section 360bbb-3(b)(1), unless the authorization is terminated or revoked.  Performed at [facility], [address], [place], [place] [address]   MRSA Next Gen by PCR, Nasal     Status: None   Collection Time: 05/28/23 10:34 AM   Specimen: Nasal Mucosa; Nasal Swab  Result Value Ref Range Status   MRSA by PCR Next Gen NOT DETECTED NOT DETECTED Final    Comment: (NOTE) The GeneXpert MRSA Assay (FDA approved for NASAL specimens only), is one component  of a comprehensive MRSA colonization surveillance program. It is not intended to diagnose MRSA  infection nor to guide or monitor treatment for MRSA infections. Test performance is not FDA approved in patients less than 2 years old. Performed at [facility], [address] [place], [place] [address]      Radiology Studies: VAS [place] LOWER EXTREMITY VENOUS (DVT) Result Date: 06/03/2023  Lower Venous DVT Study Patient Name:  KEYONTE COOKSTON  Date of Exam:   06/02/2023 Medical Rec #: [id number]         Accession #:    [id number] Date of Birth: 1950/05/04         Patient Gender: M Patient Age:   20 years Exam Location:  [facility] Procedure:      VAS [place] LOWER EXTREMITY VENOUS (DVT) Referring Phys: Dolly Rias --------------------------------------------------------------------------------  Indications: Edema, cellulitis, and Erythema.  Comparison Study: Previous study on 4.6.2025. Performing Technologist: Fernande Bras  Examination Guidelines: A complete evaluation includes B-mode imaging, spectral Doppler, color Doppler, and power Doppler as needed of all accessible portions of each vessel. Bilateral testing is considered an integral part of a complete examination. Limited examinations for reoccurring indications may be performed as noted. The reflux portion of the exam is performed with the patient in reverse Trendelenburg.  +---------+---------------+---------+-----------+----------+--------------+ RIGHT    CompressibilityPhasicitySpontaneityPropertiesThrombus Aging +---------+---------------+---------+-----------+----------+--------------+ CFV      Full           Yes      Yes                                 +---------+---------------+---------+-----------+----------+--------------+ SFJ      Full           Yes      Yes                                 +---------+---------------+---------+-----------+----------+--------------+ FV Prox  Full                                                         +---------+---------------+---------+-----------+----------+--------------+ FV Mid   Full                                                        +---------+---------------+---------+-----------+----------+--------------+ FV DistalFull                                                        +---------+---------------+---------+-----------+----------+--------------+ PFV      Full                                                        +---------+---------------+---------+-----------+----------+--------------+ POP      Full           Yes      Yes                                 +---------+---------------+---------+-----------+----------+--------------+  PTV      Full                                                        +---------+---------------+---------+-----------+----------+--------------+ PERO     Full                                                        +---------+---------------+---------+-----------+----------+--------------+   +----+---------------+---------+-----------+----------+--------------+ LEFTCompressibilityPhasicitySpontaneityPropertiesThrombus Aging +----+---------------+---------+-----------+----------+--------------+ CFV Full           Yes      Yes                                 +----+---------------+---------+-----------+----------+--------------+ SFJ Full           Yes      Yes                                 +----+---------------+---------+-----------+----------+--------------+     Summary: RIGHT: - There is no evidence of deep vein thrombosis in the lower extremity.  - No cystic structure found in the popliteal fossa.  LEFT: - No evidence of common femoral vein obstruction.   *See table(s) above for measurements and observations. Electronically signed by Irvin Mantel on 06/03/2023 at 9:29:46 AM.    Final      Kathlen Para, MD, PhD [facility]  Between 7 am - 7 pm I am available,  please contact me via Amion (for emergencies) or Securechat (non urgent messages)  Between 7 pm - 7 am I am not available, please contact night coverage MD/APP via Amion

## 2023-06-04 DIAGNOSIS — L03115 Cellulitis of right lower limb: Secondary | ICD-10-CM | POA: Diagnosis not present

## 2023-06-04 DIAGNOSIS — B351 Tinea unguium: Secondary | ICD-10-CM

## 2023-06-04 DIAGNOSIS — L0390 Cellulitis, unspecified: Secondary | ICD-10-CM | POA: Diagnosis not present

## 2023-06-04 LAB — CBC
HCT: 42.6 % (ref 39.0–52.0)
Hemoglobin: 13.9 g/dL (ref 13.0–17.0)
MCH: 30.5 pg (ref 26.0–34.0)
MCHC: 32.6 g/dL (ref 30.0–36.0)
MCV: 93.4 fL (ref 80.0–100.0)
Platelets: 548 10*3/uL — ABNORMAL HIGH (ref 150–400)
RBC: 4.56 MIL/uL (ref 4.22–5.81)
RDW: 12.5 % (ref 11.5–15.5)
WBC: 7.4 10*3/uL (ref 4.0–10.5)
nRBC: 0 % (ref 0.0–0.2)

## 2023-06-04 LAB — COMPREHENSIVE METABOLIC PANEL WITH GFR
ALT: 19 U/L (ref 0–44)
AST: 22 U/L (ref 15–41)
Albumin: 3 g/dL — ABNORMAL LOW (ref 3.5–5.0)
Alkaline Phosphatase: 54 U/L (ref 38–126)
Anion gap: 10 (ref 5–15)
BUN: 15 mg/dL (ref 8–23)
CO2: 26 mmol/L (ref 22–32)
Calcium: 8.6 mg/dL — ABNORMAL LOW (ref 8.9–10.3)
Chloride: 101 mmol/L (ref 98–111)
Creatinine, Ser: 0.73 mg/dL (ref 0.61–1.24)
GFR, Estimated: >60 mL/min (ref >60)
Glucose, Bld: 116 mg/dL — ABNORMAL HIGH (ref 70–99)
Potassium: 3.6 mmol/L (ref 3.5–5.1)
Sodium: 137 mmol/L (ref 135–145)
Total Bilirubin: 0.8 mg/dL (ref 0.0–1.2)
Total Protein: 6.7 g/dL (ref 6.5–8.1)

## 2023-06-04 LAB — MAGNESIUM: Magnesium: 1.9 mg/dL (ref 1.7–2.4)

## 2023-06-04 LAB — HEMOGLOBIN A1C
Hgb A1c MFr Bld: 5.6 % (ref 4.8–5.6)
Mean Plasma Glucose: 114 mg/dL

## 2023-06-04 MED ORDER — CEPHALEXIN 500 MG PO CAPS
500.0000 mg | ORAL_CAPSULE | Freq: Four times a day (QID) | ORAL | Status: DC
  Administered 2023-06-04 – 2023-06-05 (×4): 500 mg via ORAL
  Filled 2023-06-04 (×4): qty 1

## 2023-06-04 MED ORDER — CEFADROXIL 500 MG PO CAPS
1000.0000 mg | ORAL_CAPSULE | Freq: Two times a day (BID) | ORAL | Status: DC
  Filled 2023-06-04: qty 2

## 2023-06-04 NOTE — TOC Initial Note (Signed)
 Transition of Care [facility]) - Initial/Assessment Note    Patient Details  Name: Jonathan Powers MRN: [id number] Date of Birth: 02-Jun-1950  [facility] CM/SW Contact:    Kathryn Parish, RN Phone Number: 06/04/2023, 4:33 PM  Clinical Narrative:                 Patient presented for cellulitis, plans to return home with spouse. Patient verified PCP and insurance. DME walker.Patient states that his daughter transport home at discharge.  TOC will follow progression to discharge.  Expected Discharge Plan: Home/Self Care Barriers to Discharge: Continued Medical Work up   Patient Goals and CMS Choice Patient states their goals for this hospitalization and ongoing recovery are:: Return home CMS Medicare.gov Compare Post Acute Care list provided to:: Patient Choice offered to / list presented to : Patient [place] ownership interest in [facility].provided to:: Patient    Expected Discharge Plan and Services   Discharge Planning Services: CM Consult Post Acute Care Choice: Resumption of Svcs/PTA Provider, Durable Medical Equipment Living arrangements for the past 2 months: Single Family Home Expected Discharge Date: 05/31/23               DME Arranged: N/A DME Agency: NA       HH Arranged: NA HH Agency: NA        Prior Living Arrangements/Services Living arrangements for the past 2 months: Single Family Home Lives with:: Spouse Patient language and need for interpreter reviewed:: Yes Do you feel safe going back to the place where you live?: Yes      Need for Family Participation in Patient Care: Yes (Comment) Care giver support system in place?: Yes (comment) Current home services: DME (walker) Criminal Activity/Legal Involvement Pertinent to Current Situation/Hospitalization: No - Comment as needed  Activities of Daily Living   ADL Screening (condition at time of admission) Independently performs ADLs?: Yes (appropriate for developmental age) Is  the patient deaf or have difficulty hearing?: No Does the patient have difficulty seeing, even when wearing glasses/contacts?: No Does the patient have difficulty concentrating, remembering, or making decisions?: No  Permission Sought/Granted Permission sought to share information with : Case Manager Permission granted to share information with : Yes, Verbal Permission Granted     Permission granted to share info w AGENCY: DME company - possible need for RW  Permission granted to share info w Relationship: spouse - Cain Castillo     Emotional Assessment Appearance:: Appears stated age Attitude/Demeanor/Rapport: Engaged Affect (typically observed): Accepting, Appropriate Orientation: : Oriented to Self, Oriented to Place, Oriented to  Time, Oriented to Situation Alcohol / Substance Use: Not Applicable Psych Involvement: No (comment)  Admission diagnosis:  Cellulitis [L03.90] Cellulitis of right lower extremity [L03.115] Patient Active Problem List   Diagnosis Date Noted   Elevated troponin 06/02/2023   Recurrent cellulitis 05/29/2023   Onychomycosis 05/29/2023   Tinea pedis 05/29/2023   Dementia (HCC) 05/29/2023   Cellulitis 05/27/2023   Dyslipidemia, goal LDL below 100 01/22/2023   Need for prophylactic vaccination and inoculation against varicella 01/16/2023   Mild neurocognitive disorder due to Alzheimer's disease 09/20/2022   Exocrine pancreatic insufficiency 12/03/2021   Lung nodule 07/25/2021   Hypothyroidism 11/11/2018   Insomnia 11/10/2018   GERD (gastroesophageal reflux disease) 11/10/2018   Age related osteoporosis 03/11/2018   Flexural eczema 07/24/2017   Chronic right-sided low back pain without sciatica 07/21/2016   Superior mesenteric artery aneurysm 07/12/2015   Left thyroid nodule    OSA (obstructive  sleep apnea) 01/27/2013   Symptomatic PVCs 09/06/2012   BPH (benign prostatic hyperplasia) 09/06/2012   Obesity (BMI 30-39.9) 05/17/2006   Essential hypertension  05/17/2006   Allergic rhinitis 05/17/2006   Primary osteoarthritis 05/17/2006   PCP:  Arcadio Knuckles, MD Pharmacy:   [facility] [address], [place] - [address] [place] [address] Phone: [phone] Fax: [phone]  [facility] [address], [place] - [address] [place] [address] Phone: [phone] Fax: [phone]  [id number] - , [place] - [address] [facility] [address] [place] [address] Phone: [phone] Fax: [phone]  [place] [facility] [address] [place] [address] Phone: [phone] Fax: [phone]  [id number] - [place] [facility] - [address] [place] [address] Phone: [phone] Fax: [phone]  [facility] [address] [place] [address] Phone: [phone] Fax: [phone]     Social Drivers of Health (SDOH) Social History: SDOH Screenings   Food Insecurity: No Food Insecurity (06/02/2023)  Housing: Low Risk  (06/02/2023)  Transportation Needs: No Transportation Needs (06/02/2023)  Utilities: Not At Risk (06/02/2023)  Alcohol Screen: Low Risk  (02/19/2023)  Depression (PHQ2-9): Low Risk  (02/19/2023)  Financial Resource Strain: Low Risk  (02/19/2023)  Physical Activity: Sufficiently Active (02/19/2023)  Social Connections: Unknown (06/02/2023)  Stress: No Stress Concern Present (02/19/2023)  Tobacco Use: Low Risk  (06/01/2023)  Health Literacy: Adequate Health Literacy (02/19/2023)   SDOH Interventions:     Readmission Risk Interventions    05/31/2023   10:56 AM  Readmission Risk Prevention Plan  Post Dischage Appt Complete  Transportation Screening Complete

## 2023-06-04 NOTE — Plan of Care (Signed)

## 2023-06-04 NOTE — Progress Notes (Signed)
 PROGRESS NOTE  Jonathan Powers ION:[id number] DOB: November 02, 1950 DOA: 06/01/2023 PCP: [name], MD   LOS: 2 days   Brief Narrative / Interim history: 73 year old male with history of hypertension, hypothyroidism, mild Alzheimer's, obesity, OSA who presents to the hospital right lower extremity redness. He was hospitalized for the same and discharged on 4/10 for right lower extremity cellulitis, felt to be refractory and followed with ID in the outpatient clinic. ID was consulted at time, he was treated with IV Rocephin and eventually transition to Keflex. Upon returning home, within 2 days patient's redness recurred, has been having subjective chills and came back to the hospital   Subjective / 24h Interval events: Doing well, no fever or chills.  Still has leg pain with redness improves  Assesement and Plan: Principal Problem:   Recurrent cellulitis Active Problems:   Elevated troponin   Principal problem Right lower extremity cellulitis -patient's right lower extremity was erythematous, warm, tender to palpation on admission.  Given quick recurrence when he was converted from IV to oral antibiotics, ID consulted again, appreciate input.  Clinically improved on IV antibiotics, transition to p.o. per ID  Active problems Essential hypertension-continue home medications, blood pressure is stable   Hypothyroidism-continue Synthroid.  Follow-up TSH unremarkable  Dementia-continue memantine   Hyperlipidemia-continue statin   Hypokalemia-replace and continue to monitor, K, electrolytes acceptable   Onychomycosis-continue home terbinafine   OSA-not on CPAP   Obesity, class I-BMI 34  Scheduled Meds:  cefadroxil  1,000 mg Oral BID   enoxaparin (LOVENOX) injection  60 mg Subcutaneous Q24H   feeding supplement  237 mL Oral BID BM   irbesartan  300 mg Oral Daily   And   hydrochlorothiazide  25 mg Oral Daily   levothyroxine  175 mcg Oral QAC breakfast   memantine  10 mg Oral  BID   rosuvastatin  5 mg Oral Daily   terbinafine  250 mg Oral Daily   Continuous Infusions:   PRN Meds:.acetaminophen, ondansetron (ZOFRAN) IV, polyethylene glycol, traZODone  Current Outpatient Medications  Medication Instructions   acetaminophen (TYLENOL) 650 mg, Oral, Every 6 hours PRN   cephALEXin (KEFLEX) 500 mg, Oral, 4 times daily   cetirizine (ZYRTEC) 10 mg, Oral, Daily PRN   dicyclomine (BENTYL) 10 mg, Oral, 3 times daily   levothyroxine (SYNTHROID) 175 mcg, Oral, Daily before breakfast   memantine (NAMENDA) 10 mg, Oral, 2 times daily   olmesartan-hydrochlorothiazide (BENICAR HCT) 40-25 MG tablet 1 tablet, Oral, Daily   rosuvastatin (CRESTOR) 5 mg, Oral, Daily   terbinafine (LAMISIL) 250 mg, Oral, Daily   Testosterone 1.62 % GEL 2 Pump, Topical, Every morning   traZODone (DESYREL) 100 mg, Oral, At bedtime PRN, for sleep    Diet Orders (From admission, onward)     Start     Ordered   06/02/23 0111  Diet regular Room service appropriate? Yes; Fluid consistency: Thin  Diet effective now       Question Answer Comment  Room service appropriate? Yes   Fluid consistency: Thin      06/02/23 0110            DVT prophylaxis:    Lab Results  Component Value Date   PLT 548 (H) 06/04/2023      Code Status: Limited: Do not attempt resuscitation (DNR) -DNR-LIMITED -Do Not Intubate/DNI   Family Communication: Daughter over the phone  Status is: Inpatient Remains inpatient appropriate because: IV antibiotics   Level of care: Med-Surg  Consultants:  ID  Objective: Vitals:   06/03/23 1054 06/03/23 1141 06/03/23 1922 06/04/23 0554  BP: 126/66 121/79 126/77 (!) 101/51  Pulse: 64 71 64 (!) 53  Resp:  18 17 17   Temp:  99.1 F (37.3 C) 98.6 F (37 C) 98.1 F (36.7 C)  TempSrc:  Oral Oral   SpO2:  96% 95% 93%  Weight:      Height:        Intake/Output Summary (Last 24 hours) at 06/04/2023 1016 Last data filed at 06/04/2023 0900 Gross per 24 hour  Intake  480 ml  Output --  Net 480 ml   Wt Readings from Last 3 Encounters:  06/02/23 109.9 kg  05/26/23 122.5 kg  05/10/23 122.5 kg    Examination:  Constitutional: NAD Eyes: lids and conjunctivae normal, no scleral icterus ENMT: mmm Neck: normal, supple Respiratory: clear to auscultation bilaterally, no wheezing, no crackles. Normal respiratory effort.  Cardiovascular: Regular rate and rhythm, no murmurs / rubs / gallops. No LE edema. Abdomen: soft, no distention, no tenderness. Bowel sounds positive.   Data Reviewed: I have independently reviewed following labs and imaging studies   CBC Recent Labs  Lab 05/31/23 0618 06/01/23 2006 06/02/23 0435 06/03/23 0434 06/04/23 0427  WBC 9.1 8.3 8.0 6.8 7.4  HGB 13.7 14.4 13.3 14.5 13.9  HCT 40.3 44.0 40.5 44.8 42.6  PLT 293 388 357 430* 548*  MCV 89.0 92.6 92.5 93.3 93.4  MCH 30.2 30.3 30.4 30.2 30.5  MCHC 34.0 32.7 32.8 32.4 32.6  RDW 12.6 12.5 12.6 12.4 12.5  LYMPHSABS  --  1.6  --   --   --   MONOABS  --  0.7  --   --   --   EOSABS  --  0.3  --   --   --   BASOSABS  --  0.0  --   --   --     Recent Labs  Lab 06/01/23 2006 06/02/23 0435 06/03/23 0434 06/04/23 0427  NA 137 139 137 137  K 3.1* 3.8 3.7 3.6  CL 100 103 99 101  CO2 26 28 30 26   GLUCOSE 162* 109* 99 116*  BUN 14 13 12 15   CREATININE 0.92 0.87 1.06 0.73  CALCIUM 8.6* 8.5* 8.9 8.6*  AST  --   --  29 22  ALT  --   --  31 19  ALKPHOS  --   --  55 54  BILITOT  --   --  0.8 0.8  ALBUMIN  --   --  3.1* 3.0*  MG 2.0 2.0 2.2 1.9  CRP  --  5.7*  --   --   DDIMER 0.88*  --   --   --   TSH 2.222  --   --   --   HGBA1C  --  5.6  --   --     ------------------------------------------------------------------------------------------------------------------ Recent Labs    06/02/23 0435  CHOL 110  HDL 29*  LDLCALC 64  TRIG 83  CHOLHDL 3.8    Lab Results  Component Value Date   HGBA1C 5.6 06/02/2023    ------------------------------------------------------------------------------------------------------------------ Recent Labs    06/01/23 2006  TSH 2.222    Cardiac Enzymes No results for input(s): "CKMB", "TROPONINI", "MYOGLOBIN" in the last 168 hours.  Invalid input(s): "CK" ------------------------------------------------------------------------------------------------------------------    Component Value Date/Time   BNP 61.8 06/24/2022 1551    CBG: No results for input(s): "GLUCAP" in the last 168 hours.  Recent Results (from the  past 240 hours)  Culture, blood (Routine x 2)     Status: None   Collection Time: 05/26/23 11:45 PM   Specimen: BLOOD RIGHT ARM  Result Value Ref Range Status   Specimen Description BLOOD RIGHT ARM  Final   Special Requests   Final    BOTTLES DRAWN AEROBIC AND ANAEROBIC Blood Culture results may not be optimal due to an inadequate volume of blood received in culture bottles   Culture   Final    NO GROWTH 5 DAYS Performed at [facility], [address] [place], [place] [address]    Report Status 06/01/2023 FINAL  Final  Culture, blood (Routine x 2)     Status: None   Collection Time: 05/27/23 12:05 AM   Specimen: BLOOD RIGHT HAND  Result Value Ref Range Status   Specimen Description BLOOD RIGHT HAND  Final   Special Requests   Final    BOTTLES DRAWN AEROBIC ONLY Blood Culture results may not be optimal due to an inadequate volume of blood received in culture bottles   Culture   Final    NO GROWTH 5 DAYS Performed at [facility], [address], [place], [place] [address]    Report Status 06/01/2023 FINAL  Final  Resp panel by RT-PCR (RSV, Flu A&B, Covid) Anterior Nasal Swab     Status: None   Collection Time: 05/27/23  1:15 AM   Specimen: Anterior Nasal Swab  Result Value Ref Range Status   SARS Coronavirus 2 by RT PCR NEGATIVE NEGATIVE Final   Influenza A by PCR NEGATIVE NEGATIVE Final   Influenza B by PCR NEGATIVE  NEGATIVE Final    Comment: (NOTE) The Xpert Xpress SARS-CoV-2/FLU/RSV plus assay is intended as an aid in the diagnosis of influenza from Nasopharyngeal swab specimens and should not be used as a sole basis for treatment. Nasal washings and aspirates are unacceptable for Xpert Xpress SARS-CoV-2/FLU/RSV testing.  Fact Sheet for Patients: BloggerCourse.com  Fact Sheet for Healthcare Providers: SeriousBroker.it  This test is not yet approved or cleared by the [place]  FDA and has been authorized for detection and/or diagnosis of SARS-CoV-2 by FDA under an Emergency Use Authorization (EUA). This EUA will remain in effect (meaning this test can be used) for the duration of the COVID-19 declaration under Section 564(b)(1) of the Act, 21 U.S.C. section 360bbb-3(b)(1), unless the authorization is terminated or revoked.     Resp Syncytial Virus by PCR NEGATIVE NEGATIVE Final    Comment: (NOTE) Fact Sheet for Patients: BloggerCourse.com  Fact Sheet for Healthcare Providers: SeriousBroker.it  This test is not yet approved or cleared by the [place]  FDA and has been authorized for detection and/or diagnosis of SARS-CoV-2 by FDA under an Emergency Use Authorization (EUA). This EUA will remain in effect (meaning this test can be used) for the duration of the COVID-19 declaration under Section 564(b)(1) of the Act, 21 U.S.C. section 360bbb-3(b)(1), unless the authorization is terminated or revoked.  Performed at [facility], [address], [place], [place] [address]   MRSA Next Gen by PCR, Nasal     Status: None   Collection Time: 05/28/23 10:34 AM   Specimen: Nasal Mucosa; Nasal Swab  Result Value Ref Range Status   MRSA by PCR Next Gen NOT DETECTED NOT DETECTED Final    Comment: (NOTE) The GeneXpert MRSA Assay (FDA approved for NASAL specimens only), is one component  of a comprehensive MRSA colonization surveillance program. It is not  intended to diagnose MRSA infection nor to guide or monitor treatment for MRSA infections. Test performance is not FDA approved in patients less than 14 years old. Performed at [facility], [address], [place], [place] [address]      Radiology Studies: DG Tibia/Fibula Right Result Date: 06/03/2023 CLINICAL DATA:  [id number] Swelling [phone] EXAM: RIGHT TIBIA AND FIBULA - 2 VIEW COMPARISON:  X-ray right knee 12/15/2021 FINDINGS: No cortical erosion or destruction. There is no evidence of fracture or other focal bone lesions. At least moderate lateral tibiofemoral compartment joint degenerative changes. Plantar calcaneal spur. Subcutaneus soft tissue edema. IMPRESSION: 1. No radiographic findings to suggest osteomyelitis. 2. No acute displaced fracture or dislocation. Electronically Signed   By: Morgane  Naveau M.D.   On: 06/03/2023 12:57     Kathlen Para, MD, PhD [facility]  Between 7 am - 7 pm I am available, please contact me via Amion (for emergencies) or Securechat (non urgent messages)  Between 7 pm - 7 am I am not available, please contact night coverage MD/APP via Amion

## 2023-06-04 NOTE — Progress Notes (Addendum)
 [facility] Infectious Diseases Follow Up Note  Patient Identification: Patient Name: Jonathan Powers MRN: [id number] Admit Date: 06/01/2023  7:06 PM Age: 73 y.o.Today's Date: 06/04/2023  Reason for Visit: Recurrent cellulitis  Principal Problem:   Recurrent cellulitis Active Problems:   Elevated troponin   Antibiotics:  Cefazolin 4/11-   Lines/Hardwares:   Interval Events: Remains afebrile Labs remarkable for WBC 7.4, platelets 548 X-ray right leg with no acute abnormality  Assessment 73 year old male with prior history of melanoma, Alzheimer's disease, hypogonadism BPH, HTN, GERD, gout,, Hashimoto's thyroiditis, hypothyroidism, obesity/OSA/osteoarthritis, recurrent cellulitis, recurrent HSV, bilateral hip arthroplasty who presented to the ED on 4/11 with increasing redness, swelling on his right leg after recent hospital admission 4/5-4/10 for right leg cellulitis.    # Relapsed cellulitis of rt leg vs recurrence, non purulent - Significant improvement in redness and swelling compared to yesterday - I query some chronicity of skin changes due to no fevers, leukocytosis  - He does not seem to have any kind of vascular study including venous reflux of his lower extremities done, could be a cause of recurrence and would benefit from seeing Vascular OP - He does have onychomycosis of his foot and can be a risk factor for recurrence  # Onychomycosis  - on terbinafine, needs 12 weeks course for toe nails. CMP unremarkable   Recommendations - Will switch him to p.o. cefadroxil 1 g p.o. twice daily today and complete 10 days course.  EOT 06/11/23. Primary had conversation with the daughter to keep 1 more night due to recurrence of cellulitis after 2 doses of cephalexin after last admission which is reasonable.   - Keep leg elevated   - He needs to fu with Vascular for venous reflux study as well as Podiatry for onychomycosis.  -  Do not feel strongly about chronic p.o. antibiotics for suppression currently prior to addressing above issues.   - ID will sign off, recall back with questions or concerns.  Patient has a follow-up appointment with [facility] at 4/29 at 1 pm. - Universal/standard isolation precautions.  D/w Primary team   Addendum 4:07 pm Per Dr Elvera Lennox " spoke with the daughter, there are concerned about a delayed reaction to cefadroxil about a month ago, took it for 2 days, had itching, flushiness. "   Patient tolerated cephalexin ok last admission and will switch to cephalexin instead of PO cefadroxil.   Rest of the management as per the primary team. Thank you for the consult. Please page with pertinent questions or concerns.  ______________________________________________________________________ Subjective patient seen and examined at the bedside.  Still has some pain in his right leg but significant improvement in redness and edema.  No concerns with antibiotics  Vitals BP (!) 101/51 (BP Location: Right Arm)   Pulse (!) 53   Temp 98.1 F (36.7 C)   Resp 17   Ht 5\' 10"  (1.778 m)   Wt 109.9 kg   SpO2 93%   BMI 34.76 kg/m      Physical Exam Constitutional: Elderly male lying in the bed, nontoxic-appearing    Comments: HEENT WNL  Cardiovascular:     Rate and Rhythm: Normal rate and regular rhythm.     Heart sounds:   Pulmonary:     Effort: Pulmonary effort is normal.     Comments:   Abdominal:     Palpations: Abdomen is soft.     Tenderness: Nondistended  Musculoskeletal:        General: No swelling or tenderness in  peripheral joints.  Right leg is elevated, significant improvement in the swelling of the right leg as well as redness with shrinking of the skin.  Cellulitis is improving  Skin:    Comments: No rashes  Neurological:     General: Awake, alert and follows basic commands/history of dementia  Psychiatric:        Mood and Affect: Mood normal.   Pertinent  Microbiology Results for orders placed or performed during the hospital encounter of 05/26/23  Culture, blood (Routine x 2)     Status: None   Collection Time: 05/26/23 11:45 PM   Specimen: BLOOD RIGHT ARM  Result Value Ref Range Status   Specimen Description BLOOD RIGHT ARM  Final   Special Requests   Final    BOTTLES DRAWN AEROBIC AND ANAEROBIC Blood Culture results may not be optimal due to an inadequate volume of blood received in culture bottles   Culture   Final    NO GROWTH 5 DAYS Performed at [facility], [address], [place], [place] [address]    Report Status 06/01/2023 FINAL  Final  Culture, blood (Routine x 2)     Status: None   Collection Time: 05/27/23 12:05 AM   Specimen: BLOOD RIGHT HAND  Result Value Ref Range Status   Specimen Description BLOOD RIGHT HAND  Final   Special Requests   Final    BOTTLES DRAWN AEROBIC ONLY Blood Culture results may not be optimal due to an inadequate volume of blood received in culture bottles   Culture   Final    NO GROWTH 5 DAYS Performed at [facility], [address] [place], [place] [address]    Report Status 06/01/2023 FINAL  Final  Resp panel by RT-PCR (RSV, Flu A&B, Covid) Anterior Nasal Swab     Status: None   Collection Time: 05/27/23  1:15 AM   Specimen: Anterior Nasal Swab  Result Value Ref Range Status   SARS Coronavirus 2 by RT PCR NEGATIVE NEGATIVE Final   Influenza A by PCR NEGATIVE NEGATIVE Final   Influenza B by PCR NEGATIVE NEGATIVE Final    Comment: (NOTE) The Xpert Xpress SARS-CoV-2/FLU/RSV plus assay is intended as an aid in the diagnosis of influenza from Nasopharyngeal swab specimens and should not be used as a sole basis for treatment. Nasal washings and aspirates are unacceptable for Xpert Xpress SARS-CoV-2/FLU/RSV testing.  Fact Sheet for Patients: BloggerCourse.com  Fact Sheet for Healthcare Providers: SeriousBroker.it  This test  is not yet approved or cleared by the [place]  FDA and has been authorized for detection and/or diagnosis of SARS-CoV-2 by FDA under an Emergency Use Authorization (EUA). This EUA will remain in effect (meaning this test can be used) for the duration of the COVID-19 declaration under Section 564(b)(1) of the Act, 21 U.S.C. section 360bbb-3(b)(1), unless the authorization is terminated or revoked.     Resp Syncytial Virus by PCR NEGATIVE NEGATIVE Final    Comment: (NOTE) Fact Sheet for Patients: BloggerCourse.com  Fact Sheet for Healthcare Providers: SeriousBroker.it  This test is not yet approved or cleared by the [place]  FDA and has been authorized for detection and/or diagnosis of SARS-CoV-2 by FDA under an Emergency Use Authorization (EUA). This EUA will remain in effect (meaning this test can be used) for the duration of the COVID-19 declaration under Section 564(b)(1) of the Act, 21 U.S.C. section 360bbb-3(b)(1), unless the authorization is terminated or revoked.  Performed at [facility], [address] [place], [place] [address]   MRSA Next Gen by PCR, Nasal     Status: None   Collection Time: 05/28/23 10:34 AM   Specimen: Nasal Mucosa; Nasal Swab  Result Value Ref Range Status   MRSA by PCR Next Gen NOT DETECTED NOT DETECTED Final    Comment: (NOTE) The GeneXpert MRSA Assay (FDA approved for NASAL specimens only), is one component of a comprehensive MRSA colonization surveillance program. It is not intended to diagnose MRSA infection nor to guide or monitor treatment for MRSA infections. Test performance is not FDA approved in patients less than 14 years old. Performed at [facility], [address], [place], [place] [address]    Pertinent Lab.    Latest Ref Rng & Units 06/04/2023    4:27 AM 06/03/2023    4:34 AM 06/02/2023    4:35 AM  CBC  WBC 4.0 - 10.5 K/uL 7.4  6.8  8.0   Hemoglobin  13.0 - 17.0 g/dL 09.8  11.9  14.7   Hematocrit 39.0 - 52.0 % 42.6  44.8  40.5   Platelets 150 - 400 K/uL 548  430  357       Latest Ref Rng & Units 06/04/2023    4:27 AM 06/03/2023    4:34 AM 06/02/2023    4:35 AM  CMP  Glucose 70 - 99 mg/dL 829  99  562   BUN 8 - 23 mg/dL 15  12  13    Creatinine 0.61 - 1.24 mg/dL 1.30  8.65  7.84   Sodium 135 - 145 mmol/L 137  137  139   Potassium 3.5 - 5.1 mmol/L 3.6  3.7  3.8   Chloride 98 - 111 mmol/L 101  99  103   CO2 22 - 32 mmol/L 26  30  28    Calcium 8.9 - 10.3 mg/dL 8.6  8.9  8.5   Total Protein 6.5 - 8.1 g/dL 6.7  7.0    Total Bilirubin 0.0 - 1.2 mg/dL 0.8  0.8    Alkaline Phos 38 - 126 U/L 54  55    AST 15 - 41 U/L 22  29    ALT 0 - 44 U/L 19  31       Pertinent Imaging today Plain films and CT images have been personally visualized and interpreted; radiology reports have been reviewed. Decision making incorporated into the Impression /   DG Tibia/Fibula Right Result Date: 06/03/2023 CLINICAL DATA:  [id number] Swelling [id number] EXAM: RIGHT TIBIA AND FIBULA - 2 VIEW COMPARISON:  X-ray right knee 12/15/2021 FINDINGS: No cortical erosion or destruction. There is no evidence of fracture or other focal bone lesions. At least moderate lateral tibiofemoral compartment joint degenerative changes. Plantar calcaneal spur. Subcutaneus soft tissue edema. IMPRESSION: 1. No radiographic findings to suggest osteomyelitis. 2. No acute displaced fracture or dislocation. Electronically Signed   By: Tish Frederickson M.D.   On: 06/03/2023 12:57   VAS [place] LOWER EXTREMITY VENOUS (DVT) Result Date: 06/03/2023  Lower Venous DVT Study Patient Name:  CORNELIS KLUVER  Date of Exam:   06/02/2023 Medical Rec #: [id number]         Accession #:    [id number] Date of Birth: 1950/07/06         Patient Gender: M Patient Age:   41 years Exam Location:  [facility] Procedure:      VAS [place] LOWER EXTREMITY VENOUS (DVT) Referring Phys: Dolly Rias  --------------------------------------------------------------------------------  Indications: Edema,  cellulitis, and Erythema.  Comparison Study: Previous study on 4.6.2025. Performing Technologist: Fernande Bras  Examination Guidelines: A complete evaluation includes B-mode imaging, spectral Doppler, color Doppler, and power Doppler as needed of all accessible portions of each vessel. Bilateral testing is considered an integral part of a complete examination. Limited examinations for reoccurring indications may be performed as noted. The reflux portion of the exam is performed with the patient in reverse Trendelenburg.  +---------+---------------+---------+-----------+----------+--------------+ RIGHT    CompressibilityPhasicitySpontaneityPropertiesThrombus Aging +---------+---------------+---------+-----------+----------+--------------+ CFV      Full           Yes      Yes                                 +---------+---------------+---------+-----------+----------+--------------+ SFJ      Full           Yes      Yes                                 +---------+---------------+---------+-----------+----------+--------------+ FV Prox  Full                                                        +---------+---------------+---------+-----------+----------+--------------+ FV Mid   Full                                                        +---------+---------------+---------+-----------+----------+--------------+ FV DistalFull                                                        +---------+---------------+---------+-----------+----------+--------------+ PFV      Full                                                        +---------+---------------+---------+-----------+----------+--------------+ POP      Full           Yes      Yes                                 +---------+---------------+---------+-----------+----------+--------------+ PTV      Full                                                         +---------+---------------+---------+-----------+----------+--------------+ PERO     Full                                                        +---------+---------------+---------+-----------+----------+--------------+   +----+---------------+---------+-----------+----------+--------------+  LEFTCompressibilityPhasicitySpontaneityPropertiesThrombus Aging +----+---------------+---------+-----------+----------+--------------+ CFV Full           Yes      Yes                                 +----+---------------+---------+-----------+----------+--------------+ SFJ Full           Yes      Yes                                 +----+---------------+---------+-----------+----------+--------------+     Summary: RIGHT: - There is no evidence of deep vein thrombosis in the lower extremity.  - No cystic structure found in the popliteal fossa.  LEFT: - No evidence of common femoral vein obstruction.   *See table(s) above for measurements and observations. Electronically signed by Irvin Mantel on 06/03/2023 at 9:29:46 AM.    Final       I have personally spent involved in face-to-face and non-face-to-face activities for this patient on the day of the visit. Professional time spent includes the following activities: Preparing to see the patient (review of tests), Obtaining and/or reviewing separately obtained history (admission/discharge record), Performing a medically appropriate examination and/or evaluation , Ordering medications/tests/procedures, referring and communicating with other health care professionals, Documenting clinical information in the EMR, Independently interpreting results (not separately reported), Communicating results to the patient/family/caregiver, Counseling and educating the patient/family/caregiver and Care coordination (not separately reported).   Plan d/w requesting provider as well as ID pharm D  Of  note, portions of this note may have been created with voice recognition software. While this note has been edited for accuracy, occasional wrong-word or 'sound-a-like' substitutions may have occurred due to the inherent limitations of voice recognition software.   Electronically signed by:   Terre Ferri, MD Infectious Disease Physician [facility] Pager: [phone]

## 2023-06-05 ENCOUNTER — Other Ambulatory Visit (HOSPITAL_COMMUNITY): Payer: Self-pay

## 2023-06-05 ENCOUNTER — Telehealth (HOSPITAL_COMMUNITY): Payer: Self-pay | Admitting: Pharmacy Technician

## 2023-06-05 DIAGNOSIS — L0390 Cellulitis, unspecified: Secondary | ICD-10-CM | POA: Diagnosis not present

## 2023-06-05 DIAGNOSIS — B351 Tinea unguium: Secondary | ICD-10-CM | POA: Diagnosis not present

## 2023-06-05 DIAGNOSIS — L03115 Cellulitis of right lower limb: Secondary | ICD-10-CM | POA: Diagnosis not present

## 2023-06-05 MED ORDER — LINEZOLID 600 MG PO TABS
600.0000 mg | ORAL_TABLET | Freq: Two times a day (BID) | ORAL | Status: DC
  Administered 2023-06-05 – 2023-06-06 (×3): 600 mg via ORAL
  Filled 2023-06-05 (×3): qty 1

## 2023-06-05 NOTE — Progress Notes (Signed)
 [facility] Infectious Diseases Follow Up Note  Patient Identification: Patient Name: Jonathan Powers MRN: [id number] Admit Date: 06/01/2023  7:06 PM Age: 73 y.o.Today's Date: 06/05/2023  Reason for Visit: Recurrent cellulitis  Principal Problem:   Recurrent cellulitis Active Problems:   Elevated troponin   Antibiotics:  Cefazolin 4/11-   Lines/Hardwares:   Interval Events: Remains afebrile No labs today  Assessment 73 year old male with prior history of melanoma, Alzheimer's disease, hypogonadism BPH, HTN, GERD, gout,, Hashimoto's thyroiditis, hypothyroidism, obesity/OSA/osteoarthritis, recurrent cellulitis, recurrent HSV, bilateral hip arthroplasty who presented to the ED on 4/11 with increasing redness, swelling on his right leg after recent hospital admission 4/5-4/10 for right leg cellulitis.    # Relapsed cellulitis of rt leg vs recurrence, non purulent -  no significant change from yesterday, may be a bit worse - I query some chronicity of skin changes due to no fevers, leukocytosis  - He does not seem to have any kind of vascular study including venous reflux of his lower extremities done, could be a cause of recurrence and would benefit from seeing Vascular OP - He does have onychomycosis of his foot and can be a risk factor for recurrence  # Onychomycosis  - on terbinafine, needs 12 weeks course for toe nails. CMP unremarkable   Recommendations - switch to PO linezolid today and if continues to improve by tomorrow, can complete 10 days course. EOT 06/12/23  - Keep leg elevated   - He needs to fu with Vascular for venous reflux study as well as Podiatry for onychomycosis.  - Do not feel strongly about chronic p.o. antibiotics for suppression currently prior to addressing above issues.   - ID available as needed, recall back with questions or concerns.  Patient has a follow-up appointment with [facility] at 4/29 at 1  pm. - Universal/standard isolation precautions.  D/w Primary team   Rest of the management as per the primary team. Thank you for the consult. Please page with pertinent questions or concerns.  ______________________________________________________________________ Subjective patient seen and examined at the bedside.  Some pain in the rt leg, does not think any significant difference compared to yesterday.   Vitals BP (!) 108/54 (BP Location: Right Arm)   Pulse (!) 56   Temp 98.3 F (36.8 C) (Axillary)   Resp 17   Ht 5\' 10"  (1.778 m)   Wt 109.9 kg   SpO2 97%   BMI 34.76 kg/m      Physical Exam Constitutional: Elderly male lying in the bed, nontoxic-appearing    Comments: HEENT WNL  Cardiovascular:     Rate and Rhythm: Normal rate and regular rhythm.     Heart sounds:   Pulmonary:     Effort: Pulmonary effort is normal.     Comments:   Abdominal:     Palpations: Abdomen is soft.     Tenderness: Nondistended  Musculoskeletal:        General: No swelling or tenderness in peripheral joints.  Right leg is elevated, more or less similar swelling like yesterday, similar redness, mild tenderness and warmth, no crepitus or fluctuance.  No signs of nec fasc.   Skin:    Comments: No rashes  Neurological:     General: Awake, alert and follows basic commands/history of dementia  Psychiatric:        Mood and Affect: Mood normal.   Pertinent Microbiology Results for orders placed or performed during the hospital encounter of 05/26/23  Culture, blood (Routine x 2)  Status: None   Collection Time: 05/26/23 11:45 PM   Specimen: BLOOD RIGHT ARM  Result Value Ref Range Status   Specimen Description BLOOD RIGHT ARM  Final   Special Requests   Final    BOTTLES DRAWN AEROBIC AND ANAEROBIC Blood Culture results may not be optimal due to an inadequate volume of blood received in culture bottles   Culture   Final    NO GROWTH 5 DAYS Performed at [facility], [address], [place], [place] [address]    Report Status 06/01/2023 FINAL  Final  Culture, blood (Routine x 2)     Status: None   Collection Time: 05/27/23 12:05 AM   Specimen: BLOOD RIGHT HAND  Result Value Ref Range Status   Specimen Description BLOOD RIGHT HAND  Final   Special Requests   Final    BOTTLES DRAWN AEROBIC ONLY Blood Culture results may not be optimal due to an inadequate volume of blood received in culture bottles   Culture   Final    NO GROWTH 5 DAYS Performed at [facility], [address], [place], [place] [address]    Report Status 06/01/2023 FINAL  Final  Resp panel by RT-PCR (RSV, Flu A&B, Covid) Anterior Nasal Swab     Status: None   Collection Time: 05/27/23  1:15 AM   Specimen: Anterior Nasal Swab  Result Value Ref Range Status   SARS Coronavirus 2 by RT PCR NEGATIVE NEGATIVE Final   Influenza A by PCR NEGATIVE NEGATIVE Final   Influenza B by PCR NEGATIVE NEGATIVE Final    Comment: (NOTE) The Xpert Xpress SARS-CoV-2/FLU/RSV plus assay is intended as an aid in the diagnosis of influenza from Nasopharyngeal swab specimens and should not be used as a sole basis for treatment. Nasal washings and aspirates are unacceptable for Xpert Xpress SARS-CoV-2/FLU/RSV testing.  Fact Sheet for Patients: BloggerCourse.com  Fact Sheet for Healthcare Providers: SeriousBroker.it  This test is not yet approved or cleared by the [place]  FDA and has been authorized for detection and/or diagnosis of SARS-CoV-2 by FDA under an Emergency Use Authorization (EUA). This EUA will remain in effect (meaning this test can be used) for the duration of the COVID-19 declaration under Section 564(b)(1) of the Act, 21 U.S.C. section 360bbb-3(b)(1), unless the authorization is terminated or revoked.     Resp Syncytial Virus by PCR NEGATIVE NEGATIVE Final    Comment: (NOTE) Fact Sheet for  Patients: BloggerCourse.com  Fact Sheet for Healthcare Providers: SeriousBroker.it  This test is not yet approved or cleared by the [place]  FDA and has been authorized for detection and/or diagnosis of SARS-CoV-2 by FDA under an Emergency Use Authorization (EUA). This EUA will remain in effect (meaning this test can be used) for the duration of the COVID-19 declaration under Section 564(b)(1) of the Act, 21 U.S.C. section 360bbb-3(b)(1), unless the authorization is terminated or revoked.  Performed at [facility], [address], [place], [place] [address]   MRSA Next Gen by PCR, Nasal     Status: None   Collection Time: 05/28/23 10:34 AM   Specimen: Nasal Mucosa; Nasal Swab  Result Value Ref Range Status   MRSA by PCR Next Gen NOT DETECTED NOT DETECTED Final    Comment: (NOTE) The GeneXpert MRSA Assay (FDA approved for NASAL specimens only), is one component of a comprehensive MRSA colonization surveillance program. It is not intended to diagnose MRSA infection nor to guide or monitor treatment for MRSA  infections. Test performance is not FDA approved in patients less than 25 years old. Performed at [facility], [address], [place], [place] [address]    Pertinent Lab.    Latest Ref Rng & Units 06/04/2023    4:27 AM 06/03/2023    4:34 AM 06/02/2023    4:35 AM  CBC  WBC 4.0 - 10.5 K/uL 7.4  6.8  8.0   Hemoglobin 13.0 - 17.0 g/dL 60.4  54.0  98.1   Hematocrit 39.0 - 52.0 % 42.6  44.8  40.5   Platelets 150 - 400 K/uL 548  430  357       Latest Ref Rng & Units 06/04/2023    4:27 AM 06/03/2023    4:34 AM 06/02/2023    4:35 AM  CMP  Glucose 70 - 99 mg/dL 191  99  478   BUN 8 - 23 mg/dL 15  12  13    Creatinine 0.61 - 1.24 mg/dL 2.95  6.21  3.08   Sodium 135 - 145 mmol/L 137  137  139   Potassium 3.5 - 5.1 mmol/L 3.6  3.7  3.8   Chloride 98 - 111 mmol/L 101  99  103   CO2 22 - 32 mmol/L 26  30  28     Calcium 8.9 - 10.3 mg/dL 8.6  8.9  8.5   Total Protein 6.5 - 8.1 g/dL 6.7  7.0    Total Bilirubin 0.0 - 1.2 mg/dL 0.8  0.8    Alkaline Phos 38 - 126 U/L 54  55    AST 15 - 41 U/L 22  29    ALT 0 - 44 U/L 19  31       Pertinent Imaging today Plain films and CT images have been personally visualized and interpreted; radiology reports have been reviewed. Decision making incorporated into the Impression /   No results found.  I have personally spent involved in face-to-face and non-face-to-face activities for this patient on the day of the visit. Professional time spent includes the following activities: Preparing to see the patient (review of tests), Obtaining and/or reviewing separately obtained history (admission/discharge record), Performing a medically appropriate examination and/or evaluation , Ordering medications/tests/procedures, referring and communicating with other health care professionals, Documenting clinical information in the EMR, Independently interpreting results (not separately reported), Communicating results to the patient/family/caregiver, Counseling and educating the patient/family/caregiver and Care coordination (not separately reported).   Plan d/w requesting provider as well as ID pharm D  Of note, portions of this note may have been created with voice recognition software. While this note has been edited for accuracy, occasional wrong-word or 'sound-a-like' substitutions may have occurred due to the inherent limitations of voice recognition software.   Electronically signed by:   Terre Ferri, MD Infectious Disease Physician [facility] for Infectious Disease Pager: [phone]

## 2023-06-05 NOTE — Progress Notes (Signed)
 PROGRESS NOTE  JANDEL PATRIARCA BJY:[id number] DOB: [birth year] DOA: 06/01/2023 PCP: [name], MD   LOS: 3 days   Brief Narrative / Interim history: 73 year old male with history of hypertension, hypothyroidism, mild Alzheimer's, obesity, OSA who presents to the hospital right lower extremity redness. He was hospitalized for the same and discharged on 4/10 for right lower extremity cellulitis, felt to be refractory and followed with ID in the outpatient clinic. ID was consulted at time, he was treated with IV Rocephin and eventually transition to Keflex. Upon returning home, within 2 days patient's redness recurred, has been having subjective chills and came back to the hospital   Subjective / 24h Interval events: He was doing well, no fever or chills overnight.  Assesement and Plan: Principal Problem:   Recurrent cellulitis Active Problems:   Elevated troponin  Principal problem Right lower extremity cellulitis -patient's right lower extremity was erythematous, warm, tender to palpation on admission. Given quick recurrence when he was converted from IV to oral antibiotics, ID consulted again, appreciate input.  Clinically improved on IV antibiotics, he was switched to Keflex on 4/14, but I do not see any further improvement today and in fact it might look slightly worse - Discussed again with ID, switch to linezolid and continue to monitor  Active problems Essential hypertension-continue home medications, blood pressure is stable   Hypothyroidism-continue Synthroid.  Follow-up TSH unremarkable  Dementia-continue memantine   Hyperlipidemia-continue statin   Hypokalemia-replace and continue to monitor, K, labs unremarkable   Onychomycosis-continue home terbinafine   OSA-not on CPAP   Obesity, class I-BMI 34  Scheduled Meds:  enoxaparin (LOVENOX) injection  60 mg Subcutaneous Q24H   feeding supplement  237 mL Oral BID BM   irbesartan  300 mg Oral Daily   And    hydrochlorothiazide  25 mg Oral Daily   levothyroxine  175 mcg Oral QAC breakfast   linezolid  600 mg Oral Q12H   memantine  10 mg Oral BID   rosuvastatin  5 mg Oral Daily   terbinafine  250 mg Oral Daily   Continuous Infusions:   PRN Meds:.acetaminophen, ondansetron (ZOFRAN) IV, polyethylene glycol, traZODone  Current Outpatient Medications  Medication Instructions   acetaminophen (TYLENOL) 650 mg, Oral, Every 6 hours PRN   cephALEXin (KEFLEX) 500 mg, Oral, 4 times daily   cetirizine (ZYRTEC) 10 mg, Oral, Daily PRN   dicyclomine (BENTYL) 10 mg, Oral, 3 times daily   levothyroxine (SYNTHROID) 175 mcg, Oral, Daily before breakfast   memantine (NAMENDA) 10 mg, Oral, 2 times daily   olmesartan-hydrochlorothiazide (BENICAR HCT) 40-25 MG tablet 1 tablet, Oral, Daily   rosuvastatin (CRESTOR) 5 mg, Oral, Daily   terbinafine (LAMISIL) 250 mg, Oral, Daily   Testosterone 1.62 % GEL 2 Pump, Topical, Every morning   traZODone (DESYREL) 100 mg, Oral, At bedtime PRN, for sleep    Diet Orders (From admission, onward)     Start     Ordered   06/02/23 0111  Diet regular Room service appropriate? Yes; Fluid consistency: Thin  Diet effective now       Question Answer Comment  Room service appropriate? Yes   Fluid consistency: Thin      06/02/23 0110            DVT prophylaxis:    Lab Results  Component Value Date   PLT 548 (H) 06/04/2023      Code Status: Limited: Do not attempt resuscitation (DNR) -DNR-LIMITED -Do Not Intubate/DNI   Family Communication: Daughter  over the phone  Status is: Inpatient Remains inpatient appropriate because: IV antibiotics   Level of care: Med-Surg  Consultants:  ID  Objective: Vitals:   06/04/23 0554 06/04/23 1258 06/04/23 2016 06/05/23 0500  BP: (!) 101/51 117/61 128/63 (!) 108/54  Pulse: (!) 53 60 (!) 59 (!) 56  Resp: 17 18 18 17   Temp: 98.1 F (36.7 C) 98.6 F (37 C) 99.1 F (37.3 C) 98.3 F (36.8 C)  TempSrc:   Oral Axillary   SpO2: 93% 96% 96% 97%  Weight:      Height:        Intake/Output Summary (Last 24 hours) at 06/05/2023 1020 Last data filed at 06/05/2023 0800 Gross per 24 hour  Intake 480 ml  Output --  Net 480 ml   Wt Readings from Last 3 Encounters:  06/02/23 109.9 kg  05/26/23 122.5 kg  05/10/23 122.5 kg    Examination:  Constitutional: NAD Eyes: lids and conjunctivae normal, no scleral icterus ENMT: mmm Neck: normal, supple Respiratory: clear to auscultation bilaterally, no wheezing, no crackles.  Cardiovascular: Regular rate and rhythm, no murmurs / rubs / gallops. No LE edema. Abdomen: soft, no distention, no tenderness. Bowel sounds positive.   Data Reviewed: I have independently reviewed following labs and imaging studies   CBC Recent Labs  Lab 05/31/23 0618 06/01/23 2006 06/02/23 0435 06/03/23 0434 06/04/23 0427  WBC 9.1 8.3 8.0 6.8 7.4  HGB 13.7 14.4 13.3 14.5 13.9  HCT 40.3 44.0 40.5 44.8 42.6  PLT 293 388 357 430* 548*  MCV 89.0 92.6 92.5 93.3 93.4  MCH 30.2 30.3 30.4 30.2 30.5  MCHC 34.0 32.7 32.8 32.4 32.6  RDW 12.6 12.5 12.6 12.4 12.5  LYMPHSABS  --  1.6  --   --   --   MONOABS  --  0.7  --   --   --   EOSABS  --  0.3  --   --   --   BASOSABS  --  0.0  --   --   --     Recent Labs  Lab 06/01/23 2006 06/02/23 0435 06/03/23 0434 06/04/23 0427  NA 137 139 137 137  K 3.1* 3.8 3.7 3.6  CL 100 103 99 101  CO2 26 28 30 26   GLUCOSE 162* 109* 99 116*  BUN 14 13 12 15   CREATININE 0.92 0.87 1.06 0.73  CALCIUM 8.6* 8.5* 8.9 8.6*  AST  --   --  29 22  ALT  --   --  31 19  ALKPHOS  --   --  55 54  BILITOT  --   --  0.8 0.8  ALBUMIN  --   --  3.1* 3.0*  MG 2.0 2.0 2.2 1.9  CRP  --  5.7*  --   --   DDIMER 0.88*  --   --   --   TSH 2.222  --   --   --   HGBA1C  --  5.6  --   --     ------------------------------------------------------------------------------------------------------------------ No results for input(s): "CHOL", "HDL", "LDLCALC", "TRIG",  "CHOLHDL", "LDLDIRECT" in the last 72 hours.   Lab Results  Component Value Date   HGBA1C 5.6 06/02/2023   ------------------------------------------------------------------------------------------------------------------ No results for input(s): "TSH", "T4TOTAL", "T3FREE", "THYROIDAB" in the last 72 hours.  Invalid input(s): "FREET3"   Cardiac Enzymes No results for input(s): "CKMB", "TROPONINI", "MYOGLOBIN" in the last 168 hours.  Invalid input(s): "CK" ------------------------------------------------------------------------------------------------------------------    Component Value Date/Time  BNP 61.8 06/24/2022 1551    CBG: No results for input(s): "GLUCAP" in the last 168 hours.  Recent Results (from the past 240 hours)  Culture, blood (Routine x 2)     Status: None   Collection Time: 05/26/23 11:45 PM   Specimen: BLOOD RIGHT ARM  Result Value Ref Range Status   Specimen Description BLOOD RIGHT ARM  Final   Special Requests   Final    BOTTLES DRAWN AEROBIC AND ANAEROBIC Blood Culture results may not be optimal due to an inadequate volume of blood received in culture bottles   Culture   Final    NO GROWTH 5 DAYS Performed at [facility], [address], [place], [place] [address]    Report Status 06/01/2023 FINAL  Final  Culture, blood (Routine x 2)     Status: None   Collection Time: 05/27/23 12:05 AM   Specimen: BLOOD RIGHT HAND  Result Value Ref Range Status   Specimen Description BLOOD RIGHT HAND  Final   Special Requests   Final    BOTTLES DRAWN AEROBIC ONLY Blood Culture results may not be optimal due to an inadequate volume of blood received in culture bottles   Culture   Final    NO GROWTH 5 DAYS Performed at [facility], [address] [place], [place] [address]    Report Status 06/01/2023 FINAL  Final  Resp panel by RT-PCR (RSV, Flu A&B, Covid) Anterior Nasal Swab     Status: None   Collection Time: 05/27/23  1:15 AM   Specimen:  Anterior Nasal Swab  Result Value Ref Range Status   SARS Coronavirus 2 by RT PCR NEGATIVE NEGATIVE Final   Influenza A by PCR NEGATIVE NEGATIVE Final   Influenza B by PCR NEGATIVE NEGATIVE Final    Comment: (NOTE) The Xpert Xpress SARS-CoV-2/FLU/RSV plus assay is intended as an aid in the diagnosis of influenza from Nasopharyngeal swab specimens and should not be used as a sole basis for treatment. Nasal washings and aspirates are unacceptable for Xpert Xpress SARS-CoV-2/FLU/RSV testing.  Fact Sheet for Patients: BloggerCourse.com  Fact Sheet for Healthcare Providers: SeriousBroker.it  This test is not yet approved or cleared by the [place]  FDA and has been authorized for detection and/or diagnosis of SARS-CoV-2 by FDA under an Emergency Use Authorization (EUA). This EUA will remain in effect (meaning this test can be used) for the duration of the COVID-19 declaration under Section 564(b)(1) of the Act, 21 U.S.C. section 360bbb-3(b)(1), unless the authorization is terminated or revoked.     Resp Syncytial Virus by PCR NEGATIVE NEGATIVE Final    Comment: (NOTE) Fact Sheet for Patients: BloggerCourse.com  Fact Sheet for Healthcare Providers: SeriousBroker.it  This test is not yet approved or cleared by the [place]  FDA and has been authorized for detection and/or diagnosis of SARS-CoV-2 by FDA under an Emergency Use Authorization (EUA). This EUA will remain in effect (meaning this test can be used) for the duration of the COVID-19 declaration under Section 564(b)(1) of the Act, 21 U.S.C. section 360bbb-3(b)(1), unless the authorization is terminated or revoked.  Performed at [facility], [address] [place], [place] [address]   MRSA Next Gen by PCR, Nasal     Status: None   Collection Time: 05/28/23 10:34 AM   Specimen: Nasal Mucosa; Nasal Swab   Result Value Ref Range Status   MRSA by PCR Next Gen NOT DETECTED NOT DETECTED Final    Comment: (NOTE)  The GeneXpert MRSA Assay (FDA approved for NASAL specimens only), is one component of a comprehensive MRSA colonization surveillance program. It is not intended to diagnose MRSA infection nor to guide or monitor treatment for MRSA infections. Test performance is not FDA approved in patients less than 86 years old. Performed at [facility], [address] [place], [place] [address]      Radiology Studies: No results found.    Kathlen Para, MD, PhD [facility]  Between 7 am - 7 pm I am available, please contact me via Amion (for emergencies) or Securechat (non urgent messages)  Between 7 pm - 7 am I am not available, please contact night coverage MD/APP via Amion

## 2023-06-05 NOTE — Telephone Encounter (Signed)
 Patient Product/process development scientist completed.    The patient is insured through [employer]/ [employer]. Patient has Medicare and is not eligible for a copay card, but may be able to apply for patient assistance or Medicare RX Payment Plan (Patient Must reach out to their plan, if eligible for payment plan), if available.    Ran test claim for linezolid 600 mg and the current 7 day co-pay is $20.64.   This test claim was processed through [facility]- copay amounts may vary at other pharmacies due to pharmacy/plan contracts, or as the patient moves through the different stages of their insurance plan.     Morgan Arab, CPHT Pharmacy Technician III Certified Patient Advocate [facility] Patient Advocate Team Direct Number: [phone]  Fax: [phone]

## 2023-06-05 NOTE — Plan of Care (Signed)

## 2023-06-06 ENCOUNTER — Inpatient Hospital Stay: Admitting: Internal Medicine

## 2023-06-06 ENCOUNTER — Other Ambulatory Visit (HOSPITAL_COMMUNITY): Payer: Self-pay

## 2023-06-06 DIAGNOSIS — L0390 Cellulitis, unspecified: Secondary | ICD-10-CM | POA: Diagnosis not present

## 2023-06-06 MED ORDER — LINEZOLID 600 MG PO TABS
600.0000 mg | ORAL_TABLET | Freq: Two times a day (BID) | ORAL | 0 refills | Status: AC
  Filled 2023-06-06: qty 14, 7d supply, fill #0

## 2023-06-06 MED ORDER — ENOXAPARIN SODIUM 60 MG/0.6ML IJ SOSY
55.0000 mg | PREFILLED_SYRINGE | INTRAMUSCULAR | Status: DC
  Filled 2023-06-06: qty 0.6

## 2023-06-06 NOTE — TOC Transition Note (Signed)
 Transition of Care [facility]) - Discharge Note   Patient Details  Name: Jonathan Powers MRN: [id number] Date of Birth: 07-02-1950  Transition of Care [facility]) CM/SW Contact:  Kathryn Parish, RN Phone Number: 06/06/2023, 11:35 AM   Clinical Narrative:    Patient returning home. No TOC needs identified. TOC signing off.     Barriers to Discharge: Barriers Resolved   Patient Goals and CMS Choice            Discharge Placement                       Discharge Plan and Services Additional resources added to the After Visit Summary for                                       Social Drivers of Health (SDOH) Interventions SDOH Screenings   Food Insecurity: No Food Insecurity (06/02/2023)  Housing: Low Risk  (06/02/2023)  Transportation Needs: No Transportation Needs (06/02/2023)  Utilities: Not At Risk (06/02/2023)  Alcohol Screen: Low Risk  (02/19/2023)  Depression (PHQ2-9): Low Risk  (02/19/2023)  Financial Resource Strain: Low Risk  (02/19/2023)  Physical Activity: Sufficiently Active (02/19/2023)  Social Connections: Unknown (06/02/2023)  Stress: No Stress Concern Present (02/19/2023)  Tobacco Use: Low Risk  (06/01/2023)  Health Literacy: Adequate Health Literacy (02/19/2023)     Readmission Risk Interventions    05/31/2023   10:56 AM  Readmission Risk Prevention Plan  Post Dischage Appt Complete  Transportation Screening Complete

## 2023-06-06 NOTE — Progress Notes (Signed)
 Discharge medication delivered to patient at bedside [facility]

## 2023-06-06 NOTE — Plan of Care (Signed)

## 2023-06-06 NOTE — Discharge Summary (Signed)
 Physician Discharge Summary  Jonathan Powers:[id number] DOB: 1950-03-06 DOA: 06/01/2023  PCP: [name], MD  Admit date: 06/01/2023 Discharge date: 06/06/2023  Admitted From: home Disposition:  home  Recommendations for Outpatient Follow-up:  Follow up with PCP in 1-2 weeks Follow-up with vascular surgery as an outpatient Follow-up with infectious disease as an outpatient  Home Health: None Equipment/Devices: None  Discharge Condition: Stable CODE STATUS: DNR Diet Orders (From admission, onward)     Start     Ordered   06/02/23 0111  Diet regular Room service appropriate? Yes; Fluid consistency: Thin  Diet effective now       Question Answer Comment  Room service appropriate? Yes   Fluid consistency: Thin      06/02/23 0110            HPI: Per admitting MD, Jonathan Powers is a 73 y.o. male with hx of recent admission from 4/5 - 4/10 for RLE cellulitis ultimately improving on cefazolin/Keflex and transition to Keflex p.o. at home; additional history including Alzheimer's disease, hypertension, hypothyroidism, OSA on CPAP, GERD, gout, who returns with worsening erythema of his right lower extremity and referred by primary care for rule out of possible DVT.  Was adherent with Keflex p.o.  Had rapid extension of redness of his right leg to his knee.  No fevers, chills.  Denies any hardware in the right lower extremity.  No other changes.   Hospital Course / Discharge diagnoses: Principal problem Right lower extremity cellulitis -patient's right lower extremity was erythematous, warm, tender to palpation on admission. Given quick recurrence when he was converted from IV to oral antibiotics, ID consulted again and followed patient while hospitalized.  He was placed on IV cefazolin, and upon improvement was transition to oral Keflex.  There was no significant improvement on oral Keflex in fact may have been slightly worse, and he was switched to po linezolid, and  eventually improved with this.  He will be discharged home in stable condition with 7 days of antibiotics, has follow-up with infectious disease as an outpatient.  He was advised to follow-up with vascular surgery to ensure there are no underlying circulatory issues given his recurrent cellulitis   Active problems Essential hypertension-continue home medications, blood pressure is stable Hypothyroidism-continue Synthroid.  Follow-up TSH unremarkable Dementia-continue memantine Hyperlipidemia-continue statin Hypokalemia-potassium replenished Onychomycosis-continue home terbinafine OSA-not on CPAP Obesity, class I-BMI 34  Sepsis ruled out   Discharge Instructions   Allergies as of 06/06/2023       Reactions   Nsaids Shortness Of Breath   Naproxen specifically causes SOB/wheeze tolerates topical diclofenac without problem Tylenol OK   Tolmetin Shortness Of Breath   Naproxen specifically causes SOB/wheeze tolerates topical diclofenac without problem Tylenol OK        Medication List     STOP taking these medications    cephALEXin 500 MG capsule Commonly known as: KEFLEX       TAKE these medications    acetaminophen 325 MG tablet Commonly known as: TYLENOL Take 2 tablets (650 mg total) by mouth every 6 (six) hours as needed for mild pain (pain score 1-3), fever, headache or moderate pain (pain score 4-6).   cetirizine 10 MG tablet Commonly known as: ZYRTEC Take 10 mg by mouth daily as needed for allergies.   dicyclomine 10 MG capsule Commonly known as: BENTYL Take 10 mg by mouth 3 (three) times daily.   levothyroxine 175 MCG tablet Commonly known as: SYNTHROID TAKE 1 TABLET  BY MOUTH ONCE DAILY BEFORE BREAKFAST   linezolid 600 MG tablet Commonly known as: ZYVOX Take 1 tablet (600 mg total) by mouth every 12 (twelve) hours for 7 days.   memantine 10 MG tablet Commonly known as: NAMENDA Take 1 tablet (10 mg total) by mouth 2 (two) times daily.    olmesartan-hydrochlorothiazide 40-25 MG tablet Commonly known as: BENICAR HCT Take 1 tablet by mouth daily.   rosuvastatin 5 MG tablet Commonly known as: Crestor Take 1 tablet (5 mg total) by mouth daily.   terbinafine 250 MG tablet Commonly known as: LAMISIL Take 1 tablet (250 mg total) by mouth daily for 10 days.   Testosterone 1.62 % Gel Apply 2 Pump topically in the morning.   traZODone 100 MG tablet Commonly known as: DESYREL TAKE 1 TABLET BY MOUTH AT BEDTIME AS NEEDED FOR SLEEP       Consultations: ID  Procedures/Studies:  DG Tibia/Fibula Right Result Date: 06/03/2023 CLINICAL DATA:  86310 Swelling 16109 EXAM: RIGHT TIBIA AND FIBULA - 2 VIEW COMPARISON:  X-ray right knee 12/15/2021 FINDINGS: No cortical erosion or destruction. There is no evidence of fracture or other focal bone lesions. At least moderate lateral tibiofemoral compartment joint degenerative changes. Plantar calcaneal spur. Subcutaneus soft tissue edema. IMPRESSION: 1. No radiographic findings to suggest osteomyelitis. 2. No acute displaced fracture or dislocation. Electronically Signed   By: Tish Frederickson M.D.   On: 06/03/2023 12:57   VAS [place] LOWER EXTREMITY VENOUS (DVT) Result Date: 06/03/2023  Lower Venous DVT Study Patient Name:  Jonathan Powers  Date of Exam:   06/02/2023 Medical Rec #: [id number]         Accession #:    [id number] Date of Birth: Feb 04, 1951         Patient Gender: M Patient Age:   87 years Exam Location:  [facility] Procedure:      VAS [place] LOWER EXTREMITY VENOUS (DVT) Referring Phys: Dolly Rias --------------------------------------------------------------------------------  Indications: Edema, cellulitis, and Erythema.  Comparison Study: Previous study on 4.6.2025. Performing Technologist: Fernande Bras  Examination Guidelines: A complete evaluation includes B-mode imaging, spectral Doppler, color Doppler, and power Doppler as needed of all accessible portions of each  vessel. Bilateral testing is considered an integral part of a complete examination. Limited examinations for reoccurring indications may be performed as noted. The reflux portion of the exam is performed with the patient in reverse Trendelenburg.  +---------+---------------+---------+-----------+----------+--------------+ RIGHT    CompressibilityPhasicitySpontaneityPropertiesThrombus Aging +---------+---------------+---------+-----------+----------+--------------+ CFV      Full           Yes      Yes                                 +---------+---------------+---------+-----------+----------+--------------+ SFJ      Full           Yes      Yes                                 +---------+---------------+---------+-----------+----------+--------------+ FV Prox  Full                                                        +---------+---------------+---------+-----------+----------+--------------+ FV Mid  Full                                                        +---------+---------------+---------+-----------+----------+--------------+ FV DistalFull                                                        +---------+---------------+---------+-----------+----------+--------------+ PFV      Full                                                        +---------+---------------+---------+-----------+----------+--------------+ POP      Full           Yes      Yes                                 +---------+---------------+---------+-----------+----------+--------------+ PTV      Full                                                        +---------+---------------+---------+-----------+----------+--------------+ PERO     Full                                                        +---------+---------------+---------+-----------+----------+--------------+   +----+---------------+---------+-----------+----------+--------------+  LEFTCompressibilityPhasicitySpontaneityPropertiesThrombus Aging +----+---------------+---------+-----------+----------+--------------+ CFV Full           Yes      Yes                                 +----+---------------+---------+-----------+----------+--------------+ SFJ Full           Yes      Yes                                 +----+---------------+---------+-----------+----------+--------------+     Summary: RIGHT: - There is no evidence of deep vein thrombosis in the lower extremity.  - No cystic structure found in the popliteal fossa.  LEFT: - No evidence of common femoral vein obstruction.   *See table(s) above for measurements and observations. Electronically signed by Gerarda Fraction on 06/03/2023 at 9:29:46 AM.    Final    VAS [place] LOWER EXTREMITY VENOUS (DVT) Result Date: 05/28/2023  Lower Venous DVT Study Patient Name:  Jonathan Powers  Date of Exam:   05/27/2023 Medical Rec #: [id number]         Accession #:    [id number] Date of Birth: [birth year]         Patient Gender: M Patient Age:  72 years Exam Location:  [facility] Procedure:      VAS [place] LOWER EXTREMITY VENOUS (DVT) Referring Phys: CAROLE HALL --------------------------------------------------------------------------------  Indications: Pain, Swelling, Erythema, and Cellulitis.  Limitations: Edema, pain with compression maneuver and body habitus. Comparison Study: No prior right LEV on file Performing Technologist: Sherren Kerns RVS  Examination Guidelines: A complete evaluation includes B-mode imaging, spectral Doppler, color Doppler, and power Doppler as needed of all accessible portions of each vessel. Bilateral testing is considered an integral part of a complete examination. Limited examinations for reoccurring indications may be performed as noted. The reflux portion of the exam is performed with the patient in reverse Trendelenburg.   +---------+---------------+---------+-----------+----------+-------------------+ RIGHT    CompressibilityPhasicitySpontaneityPropertiesThrombus Aging      +---------+---------------+---------+-----------+----------+-------------------+ CFV      Full           Yes      Yes                                      +---------+---------------+---------+-----------+----------+-------------------+ SFJ      Full                                                             +---------+---------------+---------+-----------+----------+-------------------+ FV Prox  Full                                                             +---------+---------------+---------+-----------+----------+-------------------+ FV Mid   Full                                                             +---------+---------------+---------+-----------+----------+-------------------+ FV DistalFull                                                             +---------+---------------+---------+-----------+----------+-------------------+ PFV      Full                                                             +---------+---------------+---------+-----------+----------+-------------------+ POP      Full           Yes      Yes                                      +---------+---------------+---------+-----------+----------+-------------------+ PTV  patent by color     +---------+---------------+---------+-----------+----------+-------------------+ PERO                                                  Not well visualized +---------+---------------+---------+-----------+----------+-------------------+   +----+---------------+---------+-----------+----------+--------------+ LEFTCompressibilityPhasicitySpontaneityPropertiesThrombus Aging +----+---------------+---------+-----------+----------+--------------+ CFV Full           Yes       Yes                                 +----+---------------+---------+-----------+----------+--------------+    Summary: RIGHT: - There is no evidence of deep vein thrombosis in the lower extremity. However, portions of this examination were limited- see technologist comments above.  - No cystic structure found in the popliteal fossa. - Ultrasound characteristics of enlarged lymph nodes are noted in the groin.  LEFT: - No evidence of common femoral vein obstruction.   *See table(s) above for measurements and observations. Electronically signed by Genny Kid MD on 05/28/2023 at 1:30:29 PM.    Final    ECHOCARDIOGRAM COMPLETE Result Date: 05/27/2023    ECHOCARDIOGRAM REPORT   Patient Name:   Jonathan Powers Date of Exam: 05/27/2023 Medical Rec #:  [id number]        Height:       70.0 in Accession #:    [id number]       Weight:       270.1 lb Date of Birth:  1950-05-13        BSA:          2.372 m Patient Age:    72 years         BP:           104/61 mmHg Patient Gender: M                HR:           76 bpm. Exam Location:  Inpatient Procedure: 2D Echo, Cardiac Doppler, Color Doppler and Strain Analysis (Both            Spectral and Color Flow Doppler were utilized during procedure). Indications:    Elevated Troponin  History:        Patient has prior history of Echocardiogram examinations, most                 recent 12/27/2018. Risk Factors:Hypertension, GERD and Sleep                 Apnea. Hypothyroidism.  Sonographer:    Travis Friedman RDCS Referring Phys: [id number] CAROLE N HALL IMPRESSIONS  1. Left ventricular ejection fraction, by estimation, is 60 to 65%. The left ventricle has normal function. The left ventricle has no regional wall motion abnormalities. There is moderate left ventricular hypertrophy. Left ventricular diastolic parameters are consistent with Grade I diastolic dysfunction (impaired relaxation). The average left ventricular global longitudinal strain is -21.3 %. The global longitudinal strain is  normal.  2. Right ventricular systolic function is normal. The right ventricular size is normal. Tricuspid regurgitation signal is inadequate for assessing PA pressure.  3. The mitral valve is grossly normal. Trivial mitral valve regurgitation.  4. The aortic valve is tricuspid. Aortic valve regurgitation is not visualized.  5. Aortic dilatation noted. There is borderline dilatation of the ascending aorta, measuring 38 mm.  6.  The inferior vena cava is dilated in size with >50% respiratory variability, suggesting right atrial pressure of 8 mmHg. FINDINGS  Left Ventricle: Left ventricular ejection fraction, by estimation, is 60 to 65%. The left ventricle has normal function. The left ventricle has no regional wall motion abnormalities. The average left ventricular global longitudinal strain is -21.3 %. Strain was performed and the global longitudinal strain is normal. The left ventricular internal cavity size was normal in size. There is moderate left ventricular hypertrophy. Left ventricular diastolic parameters are consistent with Grade I diastolic dysfunction (impaired relaxation). Indeterminate filling pressures. Right Ventricle: The right ventricular size is normal. No increase in right ventricular wall thickness. Right ventricular systolic function is normal. Tricuspid regurgitation signal is inadequate for assessing PA pressure. Left Atrium: Left atrial size was normal in size. Right Atrium: Right atrial size was normal in size. Pericardium: There is no evidence of pericardial effusion. Mitral Valve: The mitral valve is grossly normal. Trivial mitral valve regurgitation. Tricuspid Valve: The tricuspid valve is normal in structure. Tricuspid valve regurgitation is not demonstrated. Aortic Valve: The aortic valve is tricuspid. Aortic valve regurgitation is not visualized. Aortic valve mean gradient measures 7.0 mmHg. Aortic valve peak gradient measures 12.1 mmHg. Aortic valve area, by VTI measures 3.73 cm.  Pulmonic Valve: The pulmonic valve was normal in structure. Pulmonic valve regurgitation is not visualized. Aorta: Aortic dilatation noted. There is borderline dilatation of the ascending aorta, measuring 38 mm. Venous: The inferior vena cava is dilated in size with greater than 50% respiratory variability, suggesting right atrial pressure of 8 mmHg. IAS/Shunts: No atrial level shunt detected by color flow Doppler.  LEFT VENTRICLE PLAX 2D LVIDd:         4.30 cm   Diastology LVIDs:         3.30 cm   LV e' medial:    6.13 cm/s LV PW:         1.10 cm   LV E/e' medial:  9.9 LV IVS:        1.30 cm   LV e' lateral:   8.16 cm/s LVOT diam:     2.20 cm   LV E/e' lateral: 7.4 LV SV:         120 LV SV Index:   50        2D Longitudinal Strain LVOT Area:     3.80 cm  2D Strain GLS (A4C):   -21.2 %                          2D Strain GLS (A3C):   -22.0 %                          2D Strain GLS (A2C):   -20.8 %                          2D Strain GLS Avg:     -21.3 % RIGHT VENTRICLE             IVC RV Basal diam:  4.50 cm     IVC diam: 2.20 cm RV Mid diam:    2.20 cm RV S prime:     17.40 cm/s TAPSE (M-mode): 2.7 cm LEFT ATRIUM             Index        RIGHT ATRIUM  Index LA diam:        4.10 cm 1.73 cm/m   RA Area:     16.10 cm LA Vol (A2C):   82.1 ml 34.62 ml/m  RA Volume:   43.20 ml  18.22 ml/m LA Vol (A4C):   47.4 ml 19.99 ml/m LA Biplane Vol: 62.2 ml 26.23 ml/m  AORTIC VALVE AV Area (Vmax):    3.47 cm AV Area (Vmean):   3.41 cm AV Area (VTI):     3.73 cm AV Vmax:           174.00 cm/s AV Vmean:          118.000 cm/s AV VTI:            0.321 m AV Peak Grad:      12.1 mmHg AV Mean Grad:      7.0 mmHg LVOT Vmax:         159.00 cm/s LVOT Vmean:        106.000 cm/s LVOT VTI:          0.315 m LVOT/AV VTI ratio: 0.98  AORTA Ao Root diam: 3.40 cm Ao Asc diam:  3.60 cm MITRAL VALVE MV Area (PHT): 2.87 cm    SHUNTS MV Decel Time: 264 msec    Systemic VTI:  0.32 m MV E velocity: 60.40 cm/s  Systemic Diam: 2.20 cm MV A  velocity: 72.70 cm/s MV E/A ratio:  0.83 Dinah Franco MD Electronically signed by Dinah Franco MD Signature Date/Time: 05/27/2023/5:03:43 PM    Final    CT Angio Chest PE W and/or Wo Contrast Result Date: 05/27/2023 CLINICAL DATA:  Shortness of breath, sepsis, and left lower quadrant pain. EXAM: CT ANGIOGRAPHY CHEST CT ABDOMEN AND PELVIS WITH CONTRAST TECHNIQUE: Multidetector CT imaging of the chest was performed using the standard protocol during bolus administration of intravenous contrast. Multiplanar CT image reconstructions and MIPs were obtained to evaluate the vascular anatomy. Multidetector CT imaging of the abdomen and pelvis was performed using the standard protocol during bolus administration of intravenous contrast. RADIATION DOSE REDUCTION: This exam was performed according to the departmental dose-optimization program which includes automated exposure control, adjustment of the mA and/or kV according to patient size and/or use of iterative reconstruction technique. CONTRAST:  75mL OMNIPAQUE IOHEXOL 350 MG/ML SOLN COMPARISON:  PA lateral chest today, PA chest with rib series 05/10/2023, CT chest, abdomen and pelvis with IV contrast 05/10/2023, and CTA chest 06/24/2022. FINDINGS: CTA CHEST FINDINGS Cardiovascular: The pulmonary arteries are normal caliber and do not show evidence of embolic filling defects. The cardiac size is normal. There are scattered three-vessel calcifications in the coronary arteries. There is no pericardial effusion, no venous dilatation. There is mild aortic tortuosity and atherosclerosis without aneurysm, dissection or stenosis. The great vessels are clear. Mediastinum/Nodes: No enlarged mediastinal, hilar, or axillary lymph nodes. Thyroid gland, trachea, and esophagus demonstrate no significant findings. Lungs/Pleura: Small area with chronic branching micronodular disease lateral left upper lobe, findings consistent with small airways impactions. There is increased linear  scarring or atelectasis in the right lower lobe interval decreased atelectasis in the right middle lobe. Small airway impactions also chronically noted in the posterior right upper lobe left apex. There is no consolidation, effusion or pneumothorax.  Next Musculoskeletal: Multilevel thoracic spine bridging enthesopathy, degenerative discs, spondylosis, and thoracic kyphosis. No acute or other significant osseous findings. There are healed fracture deformities of some of the anterolateral left ribs. Bilateral discoid gynecomastia appears similar. There is eventration and mild chronic elevation  of the anterior right diaphragm. No chest wall mass is seen. Review of the MIP images confirms the above findings. CT ABDOMEN and PELVIS FINDINGS Hepatobiliary: The liver is 18 cm in length mildly steatotic. Status post cholecystectomy with no biliary dilatation. Fall Pancreas: No abnormality. Spleen: No abnormality. Adrenals/Urinary Tract: There is no adrenal or renal mass enhancement. There are small Bosniak 1 cysts in the inferior pole of the kidneys but no mass enhancement of the kidneys. There is no urinary stone or obstruction. There are few renovascular calcifications at the left renal hilum. The lower pelvic ureters and the bladder are all obscured due to extensive metallic artifact from bilateral hip replacements. Stomach/Bowel: Unremarkable contracted stomach. Normal caliber small bowel without wall thickening or inflammatory change. Normal subcecal appendix. There is colonic diverticulosis without evidence of wall thickening or diverticulitis. Vascular/Lymphatic: Aortic atherosclerosis. No enlarged abdominal or pelvic lymph nodes. Reproductive: Prostate obscured by the hip replacements. Other: Small inguinal fat hernias. Small umbilical fat hernia. No incarcerated hernia. No free fluid, free air or focal inflammatory process. Pelvic phleboliths. Musculoskeletal: Chronic bilateral hip replacements. Advanced  degenerative change lumbar spine. Ankylosis superior left SI joint. Acquired spinal stenosis L4-5. Review of the MIP images confirms the above findings. IMPRESSION: 1. No evidence of arterial dilatation, embolus or other acute chest CT or CTA findings. 2. Aortic and coronary artery atherosclerosis. 3. 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 4. No acute CT findings in the abdomen or pelvis. 5. Diverticulosis without evidence of diverticulitis. 6. Small umbilical and inguinal fat hernias. 7. Mild hepatic steatosis. 8. Chronic bilateral hip replacements with extensive metallic artifact obscuring the lower pelvic ureters, prostate and bladder. 9. Acquired spinal stenosis L4-5. 10. Discoid gynecomastia. Electronically Signed   By: Denman Fischer M.D.   On: 05/27/2023 02:29   CT ABDOMEN PELVIS W CONTRAST Result Date: 05/27/2023 CLINICAL DATA:  Shortness of breath, sepsis, and left lower quadrant pain. EXAM: CT ANGIOGRAPHY CHEST CT ABDOMEN AND PELVIS WITH CONTRAST TECHNIQUE: Multidetector CT imaging of the chest was performed using the standard protocol during bolus administration of intravenous contrast. Multiplanar CT image reconstructions and MIPs were obtained to evaluate the vascular anatomy. Multidetector CT imaging of the abdomen and pelvis was performed using the standard protocol during bolus administration of intravenous contrast. RADIATION DOSE REDUCTION: This exam was performed according to the departmental dose-optimization program which includes automated exposure control, adjustment of the mA and/or kV according to patient size and/or use of iterative reconstruction technique. CONTRAST:  75mL OMNIPAQUE IOHEXOL 350 MG/ML SOLN COMPARISON:  PA lateral chest today, PA chest with rib series 05/10/2023, CT chest, abdomen and pelvis with IV contrast 05/10/2023, and CTA chest 06/24/2022. FINDINGS: CTA CHEST FINDINGS Cardiovascular: The pulmonary arteries are normal caliber and do not show evidence of  embolic filling defects. The cardiac size is normal. There are scattered three-vessel calcifications in the coronary arteries. There is no pericardial effusion, no venous dilatation. There is mild aortic tortuosity and atherosclerosis without aneurysm, dissection or stenosis. The great vessels are clear. Mediastinum/Nodes: No enlarged mediastinal, hilar, or axillary lymph nodes. Thyroid gland, trachea, and esophagus demonstrate no significant findings. Lungs/Pleura: Small area with chronic branching micronodular disease lateral left upper lobe, findings consistent with small airways impactions. There is increased linear scarring or atelectasis in the right lower lobe interval decreased atelectasis in the right middle lobe. Small airway impactions also chronically noted in the posterior right upper lobe left apex. There is no consolidation, effusion or pneumothorax.  Next Musculoskeletal:  Multilevel thoracic spine bridging enthesopathy, degenerative discs, spondylosis, and thoracic kyphosis. No acute or other significant osseous findings. There are healed fracture deformities of some of the anterolateral left ribs. Bilateral discoid gynecomastia appears similar. There is eventration and mild chronic elevation of the anterior right diaphragm. No chest wall mass is seen. Review of the MIP images confirms the above findings. CT ABDOMEN and PELVIS FINDINGS Hepatobiliary: The liver is 18 cm in length mildly steatotic. Status post cholecystectomy with no biliary dilatation. Fall Pancreas: No abnormality. Spleen: No abnormality. Adrenals/Urinary Tract: There is no adrenal or renal mass enhancement. There are small Bosniak 1 cysts in the inferior pole of the kidneys but no mass enhancement of the kidneys. There is no urinary stone or obstruction. There are few renovascular calcifications at the left renal hilum. The lower pelvic ureters and the bladder are all obscured due to extensive metallic artifact from bilateral hip  replacements. Stomach/Bowel: Unremarkable contracted stomach. Normal caliber small bowel without wall thickening or inflammatory change. Normal subcecal appendix. There is colonic diverticulosis without evidence of wall thickening or diverticulitis. Vascular/Lymphatic: Aortic atherosclerosis. No enlarged abdominal or pelvic lymph nodes. Reproductive: Prostate obscured by the hip replacements. Other: Small inguinal fat hernias. Small umbilical fat hernia. No incarcerated hernia. No free fluid, free air or focal inflammatory process. Pelvic phleboliths. Musculoskeletal: Chronic bilateral hip replacements. Advanced degenerative change lumbar spine. Ankylosis superior left SI joint. Acquired spinal stenosis L4-5. Review of the MIP images confirms the above findings. IMPRESSION: 1. No evidence of arterial dilatation, embolus or other acute chest CT or CTA findings. 2. Aortic and coronary artery atherosclerosis. 3. 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 4. No acute CT findings in the abdomen or pelvis. 5. Diverticulosis without evidence of diverticulitis. 6. Small umbilical and inguinal fat hernias. 7. Mild hepatic steatosis. 8. Chronic bilateral hip replacements with extensive metallic artifact obscuring the lower pelvic ureters, prostate and bladder. 9. Acquired spinal stenosis L4-5. 10. Discoid gynecomastia. Electronically Signed   By: Denman Fischer M.D.   On: 05/27/2023 02:29   DG Chest 2 View Result Date: 05/27/2023 CLINICAL DATA:  Shortness of breath, dry cough, chest pain. EXAM: CHEST - 2 VIEW COMPARISON:  Rib series and PA chest 05/10/2023 FINDINGS: The heart size and mediastinal contours are within normal limits. There is a low inspiration, with exaggerated AP chest diameter. The lungs are clear of infiltrates. The visualized skeletal structures are intact with osteopenia and thoracic kyphosis and spondylosis. IMPRESSION: Low inspiration. No evidence of acute chest disease or interval changes.  Electronically Signed   By: Denman Fischer M.D.   On: 05/27/2023 00:23   CT CHEST ABDOMEN PELVIS W CONTRAST Result Date: 05/10/2023 CLINICAL DATA:  Blunt polytrauma, pain in left side of chest/ribs and right elbow after fall. History of chest fracture on the left side. EXAM: CT CHEST, ABDOMEN, AND PELVIS WITH CONTRAST TECHNIQUE: Multidetector CT imaging of the chest, abdomen and pelvis was performed following the standard protocol during bolus administration of intravenous contrast. RADIATION DOSE REDUCTION: This exam was performed according to the departmental dose-optimization program which includes automated exposure control, adjustment of the mA and/or kV according to patient size and/or use of iterative reconstruction technique. CONTRAST:  OMNIPAQUE IOHEXOL 300 MG/ML  SOLN COMPARISON:  Same day rib radiographs; CT abdomen pelvis 12/06/2022; CTA chest 06/24/2022 FINDINGS: CT CHEST FINDINGS Cardiovascular: No pericardial effusion. No evidence of acute aortic injury. Coronary artery and aortic atherosclerotic calcification. Mediastinum/Nodes: Trachea and esophagus are unremarkable. No mediastinal hematoma.  Lungs/Pleura: No focal consolidation, pleural effusion, or pneumothorax. Musculoskeletal: No acute fracture. CT ABDOMEN PELVIS FINDINGS Hepatobiliary: Hepatic steatosis. Cholecystectomy. No acute abnormality. Pancreas: Unremarkable. Spleen: No acute injury. Adrenals/Urinary Tract: Unremarkable adrenal glands. No renal injury. No hydronephrosis. The bladder is obscured by streak artifact. Stomach/Bowel: No bowel obstruction or bowel wall thickening. Stomach and appendix are within normal limits. Vascular/Lymphatic: Aortic atherosclerotic calcification. No lymphadenopathy. Reproductive: Obscured by streak artifact. Other: No free intraperitoneal fluid or air. Musculoskeletal: Bilateral hip arthroplasties. IMPRESSION: 1. No evidence of acute traumatic injury in the chest, abdomen, or pelvis. 2. Hepatic  steatosis. 3. Aortic Atherosclerosis (ICD10-I70.0). Electronically Signed   By: Rozell Cornet M.D.   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 Left anterior rib pain. Left shoulder pain. Right elbow abrasion and active bleeding. EXAM: LEFT RIBS AND CHEST - 3+ VIEW COMPARISON:  07/12/2022 FINDINGS: Normal cardiomediastinal silhouette. No focal consolidation, pleural effusion, or pneumothorax. No displaced rib fractures. Chronic left anterior seventh rib fracture. IMPRESSION: No acute cardiopulmonary disease. Electronically Signed   By: Rozell Cornet M.D.   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 Left anterior rib pain. Left shoulder pain. Right elbow abrasion and active bleeding. EXAM: RIGHT ELBOW - COMPLETE 3+ VIEW COMPARISON:  None Available. FINDINGS: No acute fracture or dislocation. No elbow joint effusion. Degenerative arthritis with chronic degenerative fragments about the medial epicondyle and coronoid process. Small olecranon enthesophyte. IMPRESSION: No acute fracture or dislocation. Electronically Signed   By: Rozell Cornet M.D.   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 Left anterior rib pain. Left shoulder pain. Right elbow abrasion and active bleeding. EXAM: LEFT SCAPULA - 2+ VIEWS COMPARISON:  07/12/2022 FINDINGS: No acute fracture or dislocation. Degenerative arthritis glenohumeral and AC joints. IMPRESSION: No acute fracture or dislocation. Electronically Signed   By: Rozell Cornet M.D.   On: 05/10/2023 20:08     Subjective: - no chest pain, shortness of breath, no abdominal pain, nausea or vomiting.   Discharge Exam: BP (!) 105/53 (BP Location: Left Arm)   Pulse (!) 56   Temp 98.4 F (36.9 C) (Oral)   Resp 18    Ht 5\' 10"  (1.778 m)   Wt 109.9 kg   SpO2 95%   BMI 34.76 kg/m   General: Pt is alert, awake, not in acute distress Cardiovascular: RRR, S1/S2 +, no rubs, no gallops Respiratory: CTA bilaterally, no wheezing, no rhonchi Abdominal: Soft, NT, ND, bowel sounds + Extremities: no edema, no cyanosis    The results of significant diagnostics from this hospitalization (including imaging, microbiology, ancillary and laboratory) are listed below for reference.     Microbiology: Recent Results (from the past 240 hours)  MRSA Next Gen by PCR, Nasal     Status: None   Collection Time: 05/28/23 10:34 AM   Specimen: Nasal Mucosa; Nasal Swab  Result Value Ref Range Status   MRSA by PCR Next Gen NOT DETECTED NOT DETECTED Final    Comment: (NOTE) The GeneXpert MRSA Assay (FDA approved for NASAL specimens only), is one component of a comprehensive MRSA colonization surveillance program. It is not intended to diagnose MRSA infection nor to guide or monitor treatment for MRSA infections. Test performance is not FDA approved  in patients less than 74 years old. Performed at [facility], [address], [place], [place] [address]      Labs: Basic Metabolic Panel: Recent Labs  Lab 06/01/23 2006 06/02/23 0435 06/03/23 0434 06/04/23 0427  NA 137 139 137 137  K 3.1* 3.8 3.7 3.6  CL 100 103 99 101  CO2 26 28 30 26   GLUCOSE 162* 109* 99 116*  BUN 14 13 12 15   CREATININE 0.92 0.87 1.06 0.73  CALCIUM 8.6* 8.5* 8.9 8.6*  MG 2.0 2.0 2.2 1.9  PHOS  --  3.1  --   --    Liver Function Tests: Recent Labs  Lab 06/03/23 0434 06/04/23 0427  AST 29 22  ALT 31 19  ALKPHOS 55 54  BILITOT 0.8 0.8  PROT 7.0 6.7  ALBUMIN 3.1* 3.0*   CBC: Recent Labs  Lab 05/31/23 0618 06/01/23 2006 06/02/23 0435 06/03/23 0434 06/04/23 0427  WBC 9.1 8.3 8.0 6.8 7.4  NEUTROABS  --  5.6  --   --   --   HGB 13.7 14.4 13.3 14.5 13.9  HCT 40.3 44.0 40.5 44.8 42.6  MCV 89.0 92.6 92.5 93.3 93.4  PLT 293  388 357 430* 548*   CBG: No results for input(s): "GLUCAP" in the last 168 hours. Hgb A1c No results for input(s): "HGBA1C" in the last 72 hours. Lipid Profile No results for input(s): "CHOL", "HDL", "LDLCALC", "TRIG", "CHOLHDL", "LDLDIRECT" in the last 72 hours. Thyroid function studies No results for input(s): "TSH", "T4TOTAL", "T3FREE", "THYROIDAB" in the last 72 hours.  Invalid input(s): "FREET3" Urinalysis    Component Value Date/Time   COLORURINE AMBER (A) 05/27/2023 0012   APPEARANCEUR HAZY (A) 05/27/2023 0012   LABSPEC 1.027 05/27/2023 0012   PHURINE 5.0 05/27/2023 0012   GLUCOSEU 50 (A) 05/27/2023 0012   GLUCOSEU NEGATIVE 08/02/2022 1501   HGBUR NEGATIVE 05/27/2023 0012   BILIRUBINUR NEGATIVE 05/27/2023 0012   KETONESUR 5 (A) 05/27/2023 0012   PROTEINUR NEGATIVE 05/27/2023 0012   UROBILINOGEN 0.2 08/02/2022 1501   NITRITE NEGATIVE 05/27/2023 0012   LEUKOCYTESUR NEGATIVE 05/27/2023 0012    FURTHER DISCHARGE INSTRUCTIONS:   Get Medicines reviewed and adjusted: Please take all your medications with you for your next visit with your Primary MD   Laboratory/radiological data: Please request your Primary MD to go over all hospital tests and procedure/radiological results at the follow up, please ask your Primary MD to get all Hospital records sent to his/her office.   In some cases, they will be blood work, cultures and biopsy results pending at the time of your discharge. Please request that your primary care M.D. goes through all the records of your hospital data and follows up on these results.   Also Note the following: If you experience worsening of your admission symptoms, develop shortness of breath, life threatening emergency, suicidal or homicidal thoughts you must seek medical attention immediately by calling 911 or calling your MD immediately  if symptoms less severe.   You must read complete instructions/literature along with all the possible adverse  reactions/side effects for all the Medicines you take and that have been prescribed to you. Take any new Medicines after you have completely understood and accpet all the possible adverse reactions/side effects.    Do not drive when taking Pain medications or sleeping medications (Benzodaizepines)   Do not take more than prescribed Pain, Sleep and Anxiety Medications. It is not advisable to combine anxiety,sleep and pain medications without talking with  your primary care practitioner   Special Instructions: If you have smoked or chewed Tobacco  in the last 2 yrs please stop smoking, stop any regular Alcohol  and or any Recreational drug use.   Wear Seat belts while driving.   Please note: You were cared for by a hospitalist during your hospital stay. Once you are discharged, your primary care physician will handle any further medical issues. Please note that NO REFILLS for any discharge medications will be authorized once you are discharged, as it is imperative that you return to your primary care physician (or establish a relationship with a primary care physician if you do not have one) for your post hospital discharge needs so that they can reassess your need for medications and monitor your lab values.  Time coordinating discharge: 35 minutes  SIGNED:  Kathlen Para, MD, PhD 06/06/2023, 9:25 AM

## 2023-06-07 ENCOUNTER — Inpatient Hospital Stay: Admitting: Internal Medicine

## 2023-06-09 ENCOUNTER — Encounter: Payer: Self-pay | Admitting: Physician Assistant

## 2023-06-11 NOTE — Telephone Encounter (Signed)
 Actually I just spoke with neuropsych and cannot be done any earlier, insurance will not cover it since there is no loss of independence, so keep the August appt. I will be out the country for a couple of months, but will be back by the time that he had his neuropsychological exam. Thanks

## 2023-06-17 ENCOUNTER — Other Ambulatory Visit: Payer: Self-pay | Admitting: Internal Medicine

## 2023-06-18 ENCOUNTER — Other Ambulatory Visit: Payer: Self-pay

## 2023-06-18 ENCOUNTER — Other Ambulatory Visit (HOSPITAL_COMMUNITY): Payer: Self-pay

## 2023-06-18 MED ORDER — OLMESARTAN MEDOXOMIL-HCTZ 40-25 MG PO TABS
1.0000 | ORAL_TABLET | Freq: Every day | ORAL | 1 refills | Status: DC
  Filled 2023-06-18: qty 60, 60d supply, fill #0

## 2023-06-19 ENCOUNTER — Other Ambulatory Visit: Payer: Self-pay | Admitting: Internal Medicine

## 2023-06-19 ENCOUNTER — Ambulatory Visit: Admitting: Internal Medicine

## 2023-06-19 ENCOUNTER — Other Ambulatory Visit: Payer: Self-pay

## 2023-06-19 VITALS — BP 97/64 | HR 67 | Temp 97.8°F | Wt 235.0 lb

## 2023-06-19 DIAGNOSIS — L03116 Cellulitis of left lower limb: Secondary | ICD-10-CM | POA: Diagnosis not present

## 2023-06-19 MED ORDER — LINEZOLID 600 MG PO TABS: 600.0000 mg | ORAL_TABLET | Freq: Two times a day (BID) | ORAL | 0 refills | Status: AC

## 2023-06-19 NOTE — Telephone Encounter (Signed)
 Please advise on message below   Pharmacy comment: this may increase risk of serotonin syndrome with trazodone . is it ok to fill ? thanks.

## 2023-06-19 NOTE — Telephone Encounter (Signed)
 Yes fine to take - risk is very minimal. Also med list says that patient is not taking.

## 2023-06-19 NOTE — Progress Notes (Signed)
 Patient Active Problem List   Diagnosis Date Noted   Elevated troponin 06/02/2023   Recurrent cellulitis 05/29/2023   Onychomycosis 05/29/2023   Tinea pedis 05/29/2023   Dementia (HCC) 05/29/2023   Cellulitis 05/27/2023   Dyslipidemia, goal LDL below 100 01/22/2023   Need for prophylactic vaccination and inoculation against varicella 01/16/2023   Mild neurocognitive disorder due to Alzheimer's disease 09/20/2022   Exocrine pancreatic insufficiency 12/03/2021   Lung nodule 07/25/2021   Hypothyroidism 11/11/2018   Insomnia 11/10/2018   GERD (gastroesophageal reflux disease) 11/10/2018   Age related osteoporosis 03/11/2018   Flexural eczema 07/24/2017   Chronic right-sided low back pain without sciatica 07/21/2016   Superior mesenteric artery aneurysm 07/12/2015   Left thyroid  nodule    OSA (obstructive sleep apnea) 01/27/2013   Symptomatic PVCs 09/06/2012   BPH (benign prostatic hyperplasia) 09/06/2012   Obesity (BMI 30-39.9) 05/17/2006   Essential hypertension 05/17/2006   Allergic rhinitis 05/17/2006   Primary osteoarthritis 05/17/2006    Patient's Medications  New Prescriptions   No medications on file  Previous Medications   ACETAMINOPHEN  (TYLENOL ) 325 MG TABLET    Take 2 tablets (650 mg total) by mouth every 6 (six) hours as needed for mild pain (pain score 1-3), fever, headache or moderate pain (pain score 4-6).   CETIRIZINE (ZYRTEC) 10 MG TABLET    Take 10 mg by mouth daily as needed for allergies.   DICYCLOMINE  (BENTYL ) 10 MG CAPSULE    Take 10 mg by mouth 3 (three) times daily.   LEVOTHYROXINE  (SYNTHROID ) 175 MCG TABLET    TAKE 1 TABLET BY MOUTH ONCE DAILY BEFORE BREAKFAST   MEMANTINE  (NAMENDA ) 10 MG TABLET    Take 1 tablet (10 mg total) by mouth 2 (two) times daily.   OLMESARTAN -HYDROCHLOROTHIAZIDE  (BENICAR  HCT) 40-25 MG TABLET    Take 1 tablet by mouth daily.   ROSUVASTATIN  (CRESTOR ) 5 MG TABLET    Take 1 tablet (5 mg total) by mouth daily.    TESTOSTERONE  1.62 % GEL    Apply 2 Pump topically in the morning.   TRAZODONE  (DESYREL ) 100 MG TABLET    TAKE 1 TABLET BY MOUTH AT BEDTIME AS NEEDED FOR SLEEP  Modified Medications   No medications on file  Discontinued Medications   No medications on file    Subjective: 73 year old male with past medical history as below presents for hospital follow-up of cellulitis of right leg versus recurrence.  He was discharged on linezolid  to complete 10-day course EOT 07/12/2023.  He reports that his cellulitis symptoms significantly improved.  No new complaints. Please see HPI from 06/03/2023 for further details.   Review of Systems: Review of Systems  All other systems reviewed and are negative.   Past Medical History:  Diagnosis Date   Acute recurrent pansinusitis 02/17/2021   Age related osteoporosis 03/11/2018   Allergic rhinitis 05/17/2006   BPH (benign prostatic hyperplasia) 09/06/2012   Cellulitis of left lower extremity 11/10/2018   Cellulitis, right leg    Recurrent R hip cellulitis 1/08,3/08    Chronic fatigue, unspecified    Chronic hyperglycemia 06/29/2020   Chronic right-sided low back pain without sciatica 07/21/2016   Chronic sinusitis 07/10/2021   Deviated septum 02/17/2021   Diverticulitis    Essential hypertension 05/17/2006   2014     Impression  Exercise Capacity:  Lexiscan  with low level exercise.  BP Response:  Normal blood pressure response.  Clinical Symptoms:  No significant symptoms noted.  ECG Impression:  No significant ST segment change suggestive of ischemia.  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   Flexural eczema 07/24/2017   Generalized abdominal tenderness with rebound tenderness 08/02/2022   GERD (gastroesophageal reflux disease)    Gout    Hashimoto's  thyroiditis    History of skin cancer    Melanoma on back   Hypogonadism male    low T, a/w ED   Hypothyroidism 11/11/2018   Idiopathic urticaria    Insomnia 11/10/2018   Left flank pain 08/02/2022   Left thyroid  nodule 2008   on US , decrease size in 2011 US    Lung nodule 07/25/2021   Migraines    Atypical and ocular   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 No acute findings.  2. 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  3. Ectatic abdominal aorta at risk for aneurysm development.  Recommend followup by ultrasound in 5 years. This recommendation  follows ACR consensus guidelines: White Paper of the ACR Incidental  Findings Committee II on Vascular Findings. J Am Coll Radiol 2013;  10:789-794.  4. 1.   Symptomatic PVCs 09/06/2012    Social History   Tobacco Use   Smoking status: Never   Smokeless tobacco: Never  Vaping Use   Vaping status: Never Used  Substance Use Topics   Alcohol use: Not Currently    Comment: rare   Drug use: No    Family History  Problem Relation Age of Onset   Arthritis Father    Heart disease Father    Diabetes Father    Vascular Disease Father        AoBifem   Arthritis Mother    Diabetes Mother    Multiple sclerosis Mother    Cancer Paternal Grandmother        "GI"   Cancer Paternal Grandfather        stomach cancer   Thyroid  cancer Sister    Uterine cancer Sister     Allergies   Allergen Reactions   Nsaids Shortness Of Breath    Naproxen specifically causes SOB/wheeze tolerates topical diclofenac  without problem Tylenol  OK   Tolmetin Shortness Of Breath    Naproxen specifically causes SOB/wheeze tolerates topical diclofenac  without problem Tylenol  OK    Health Maintenance  Topic Date Due   Zoster Vaccines- Shingrix  (1 of 2) Never done   INFLUENZA VACCINE  09/21/2023   Medicare Annual Wellness (AWV)  02/19/2024   Colonoscopy  03/09/2025   DTaP/Tdap/Td (2 - Td or Tdap) 07/11/2025   Pneumonia Vaccine 49+ Years old  Completed   Hepatitis C Screening  Completed   HPV VACCINES  Aged Out   Meningococcal B Vaccine  Aged Out  COVID-19 Vaccine  Discontinued    Objective:  Vitals:   06/19/23 1308  BP: 97/64  Pulse: 67  Temp: 97.8 F (36.6 C)  TempSrc: Oral  SpO2: 97%  Weight: 235 lb (106.6 kg)   Body mass index is 33.72 kg/m.  Physical Exam Constitutional:      General: He is not in acute distress.    Appearance: He is normal weight. He is not toxic-appearing.  HENT:     Head: Normocephalic and atraumatic.     Right Ear: External ear normal.     Left Ear: External ear normal.     Nose: No congestion or rhinorrhea.     Mouth/Throat:     Mouth: Mucous membranes are moist.     Pharynx: Oropharynx is clear.  Eyes:     Extraocular Movements: Extraocular movements intact.     Conjunctiva/sclera: Conjunctivae normal.     Pupils: Pupils are equal, round, and reactive to light.  Cardiovascular:     Rate and Rhythm: Normal rate and regular rhythm.     Heart sounds: No murmur heard.    No friction rub. No gallop.  Pulmonary:     Effort: Pulmonary effort is normal.     Breath sounds: Normal breath sounds.  Abdominal:     General: Abdomen is flat. Bowel sounds are normal.     Palpations: Abdomen is soft.  Musculoskeletal:        General: No swelling. Normal range of motion.     Cervical back: Normal range of motion and neck supple.  Skin:     General: Skin is warm and dry.  Neurological:     General: No focal deficit present.     Mental Status: He is oriented to person, place, and time.  Psychiatric:        Mood and Affect: Mood normal.   Lab Results Lab Results  Component Value Date   WBC 7.4 06/04/2023   HGB 13.9 06/04/2023   HCT 42.6 06/04/2023   MCV 93.4 06/04/2023   PLT 548 (H) 06/04/2023    Lab Results  Component Value Date   CREATININE 0.73 06/04/2023   BUN 15 06/04/2023   NA 137 06/04/2023   K 3.6 06/04/2023   CL 101 06/04/2023   CO2 26 06/04/2023    Lab Results  Component Value Date   ALT 19 06/04/2023   AST 22 06/04/2023   ALKPHOS 54 06/04/2023   BILITOT 0.8 06/04/2023    Lab Results  Component Value Date   CHOL 110 06/02/2023   HDL 29 (L) 06/02/2023   LDLCALC 64 06/02/2023   TRIG 83 06/02/2023   CHOLHDL 3.8 06/02/2023   Lab Results  Component Value Date   LABRPR NON-REACTIVE 06/29/2020   No results found for: "HIV1RNAQUANT", "HIV1RNAVL", "CD4TABS"   Problem List Items Addressed This Visit   None  Assessment/Plan #Relapsed cellulitis of right leg versus recurrent, nonpurulent -Completed linezolid  antibiotics.  No signs of cellulitis improved. - Rx linezolid  x 1 round to have on hand if needed given history of recurrent cellulitis.  Would recommend appointment in clinic if concern for cellulitis as well. - Follow-up with ID. #Onychomycosis - On terbinafine  x 12 weeks - F/u with podiatry      Orlie Bjornstad, MD [facility] 06/19/2023, 1:16 PM   I have personally spent 42 minutes involved in face-to-face and non-face-to-face activities for this patient on the day of the visit. Professional time spent includes the  following activities: Preparing to see the patient (review of tests), Obtaining and/or reviewing separately obtained history (admission/discharge record), Performing a medically appropriate examination and/or evaluation ,  Ordering medications/tests/procedures, referring and communicating with other health care professionals, Documenting clinical information in the EMR, Independently interpreting results (not separately reported), Communicating results to the patient/family/caregiver, Counseling and educating the patient/family/caregiver and Care coordination (not separately reported).

## 2023-06-27 ENCOUNTER — Telehealth: Payer: Self-pay

## 2023-06-27 ENCOUNTER — Encounter: Payer: Self-pay | Admitting: Internal Medicine

## 2023-06-27 NOTE — Telephone Encounter (Signed)
 Patient's daughter called to schedule a follow up on recurrent cellulitis, was told to call back if symptoms worsen. Wanted to send a message to Dr. Zelda Hickman about conversation they had about starting Linezolid  but was told patient was going to need an appointment before medication was prescribed, schedule for first available on 5/22. If Daughter [name] can get a call back at [phone] to know if he can be seen before or if medication can be sent before appointment scheduled.

## 2023-06-27 NOTE — Telephone Encounter (Signed)
 Spoke with Holland Lundborg (daughter) regarding message. States yesterday patient noticed increase redness on right lower leg, slight swelling, slight pain. Denies fever. Would like to know if linezolid  could be called into [facility] on [address].  Will send pictures on mychart. Julien Odor, RMA

## 2023-06-29 ENCOUNTER — Other Ambulatory Visit: Payer: Self-pay

## 2023-06-29 DIAGNOSIS — I872 Venous insufficiency (chronic) (peripheral): Secondary | ICD-10-CM

## 2023-07-02 ENCOUNTER — Ambulatory Visit (INDEPENDENT_AMBULATORY_CARE_PROVIDER_SITE_OTHER): Admitting: Internal Medicine

## 2023-07-02 ENCOUNTER — Encounter: Payer: Self-pay | Admitting: Internal Medicine

## 2023-07-02 ENCOUNTER — Ambulatory Visit (INDEPENDENT_AMBULATORY_CARE_PROVIDER_SITE_OTHER)

## 2023-07-02 VITALS — BP 104/60 | HR 51 | Temp 98.6°F | Ht 70.0 in | Wt 236.0 lb

## 2023-07-02 DIAGNOSIS — R634 Abnormal weight loss: Secondary | ICD-10-CM | POA: Insufficient documentation

## 2023-07-02 DIAGNOSIS — I10 Essential (primary) hypertension: Secondary | ICD-10-CM | POA: Diagnosis not present

## 2023-07-02 DIAGNOSIS — R748 Abnormal levels of other serum enzymes: Secondary | ICD-10-CM | POA: Insufficient documentation

## 2023-07-02 DIAGNOSIS — R10814 Left lower quadrant abdominal tenderness: Secondary | ICD-10-CM

## 2023-07-02 DIAGNOSIS — I950 Idiopathic hypotension: Secondary | ICD-10-CM | POA: Insufficient documentation

## 2023-07-02 DIAGNOSIS — R1032 Left lower quadrant pain: Secondary | ICD-10-CM | POA: Diagnosis not present

## 2023-07-02 DIAGNOSIS — Z96643 Presence of artificial hip joint, bilateral: Secondary | ICD-10-CM | POA: Diagnosis not present

## 2023-07-02 DIAGNOSIS — R001 Bradycardia, unspecified: Secondary | ICD-10-CM | POA: Insufficient documentation

## 2023-07-02 LAB — URINALYSIS, ROUTINE W REFLEX MICROSCOPIC
Bilirubin Urine: NEGATIVE
Hgb urine dipstick: NEGATIVE
Ketones, ur: NEGATIVE
Leukocytes,Ua: NEGATIVE
Nitrite: NEGATIVE
RBC / HPF: NONE SEEN (ref 0–?)
Specific Gravity, Urine: 1.015 (ref 1.000–1.030)
Total Protein, Urine: NEGATIVE
Urine Glucose: NEGATIVE
Urobilinogen, UA: 0.2 (ref 0.0–1.0)
WBC, UA: NONE SEEN (ref 0–?)
pH: 7 (ref 5.0–8.0)

## 2023-07-02 LAB — BASIC METABOLIC PANEL WITH GFR
BUN: 12 mg/dL (ref 6–23)
CO2: 32 meq/L (ref 19–32)
Calcium: 9.7 mg/dL (ref 8.4–10.5)
Chloride: 98 meq/L (ref 96–112)
Creatinine, Ser: 0.95 mg/dL (ref 0.40–1.50)
GFR: 80.42 mL/min (ref >60.00)
Glucose, Bld: 101 mg/dL — ABNORMAL HIGH (ref 70–99)
Potassium: 4.4 meq/L (ref 3.5–5.1)
Sodium: 139 meq/L (ref 135–145)

## 2023-07-02 LAB — C-REACTIVE PROTEIN: CRP: <1 mg/dL (ref 0.5–20.0)

## 2023-07-02 LAB — CORTISOL: Cortisol, Plasma: 5.6 ug/dL

## 2023-07-02 LAB — LIPASE: Lipase: 207 U/L — ABNORMAL HIGH (ref 11.0–59.0)

## 2023-07-02 NOTE — Patient Instructions (Signed)

## 2023-07-02 NOTE — Progress Notes (Signed)
 Subjective:  Patient ID: Jonathan Powers, male    DOB: 28-Oct-1950  Age: 73 y.o. MRN: [id number]  CC: Hospitalization Follow-up (10 day hospital stay. Patient states that he's been okay since the hospital but his right leg is still a little sore. ), Referral (Cardiology. Patients daughter states that his Troponin levels were high in the hospital ), and Abdominal Pain   HPI Jonathan Powers presents for f/up --  Discussed the use of AI scribe software for clinical note transcription with the patient, who gave verbal consent to proceed.  History of Present Illness   Jonathan Powers is a 74 year old male who presents with a recent episode of cellulitis in the right leg.  He experienced a recent episode of cellulitis in the right leg, marking the first occurrence in this location, as previous episodes were in the left leg. The condition is improving, with the skin peeling, indicating healing. He has completed a course of antibiotics and is not currently on any treatment for cellulitis. An antibiotic has been provided for potential future flare-ups.  He is currently taking Olmesartan /hct for blood pressure management. He notes that his blood pressure has been very low and reports significant weight loss. No symptoms of weakness, dizziness, or lightheadedness. He also denies symptoms of dehydration such as nausea, vomiting, or diarrhea.  He mentions experiencing some left abdominal pain but denies any chest pain or significant discomfort elsewhere.       Outpatient Medications Prior to Visit  Medication Sig Dispense Refill   acetaminophen  (TYLENOL ) 325 MG tablet Take 2 tablets (650 mg total) by mouth every 6 (six) hours as needed for mild pain (pain score 1-3), fever, headache or moderate pain (pain score 4-6).     cetirizine (ZYRTEC) 10 MG tablet Take 10 mg by mouth daily as needed for allergies.     levothyroxine  (SYNTHROID ) 175 MCG tablet TAKE 1 TABLET BY MOUTH ONCE DAILY BEFORE  BREAKFAST 90 tablet 0   memantine  (NAMENDA ) 10 MG tablet Take 1 tablet (10 mg total) by mouth 2 (two) times daily. 60 tablet 11   OVER THE COUNTER MEDICATION Take by mouth daily. Plasmologens.     rosuvastatin  (CRESTOR ) 5 MG tablet Take 1 tablet (5 mg total) by mouth daily. 90 tablet 1   Testosterone  1.62 % GEL Apply 2 Pump topically in the morning.     olmesartan -hydrochlorothiazide  (BENICAR  HCT) 40-25 MG tablet Take 1 tablet by mouth daily. 60 tablet 1   dicyclomine  (BENTYL ) 10 MG capsule Take 10 mg by mouth 3 (three) times daily. (Patient not taking: Reported on 06/19/2023)     traZODone  (DESYREL ) 100 MG tablet TAKE 1 TABLET BY MOUTH AT BEDTIME AS NEEDED FOR SLEEP 90 tablet 0   No facility-administered medications prior to visit.    ROS Review of Systems  Constitutional:  Positive for unexpected weight change (wt loss). Negative for appetite change, chills, diaphoresis, fatigue and fever.  HENT:  Negative for trouble swallowing.   Eyes: Negative.   Respiratory: Negative.  Negative for cough, chest tightness, shortness of breath and wheezing.   Cardiovascular:  Negative for chest pain, palpitations and leg swelling.  Gastrointestinal:  Positive for abdominal pain. Negative for blood in stool, diarrhea, nausea and vomiting.  Genitourinary:  Positive for flank pain. Negative for difficulty urinating.  Musculoskeletal:  Negative for arthralgias and gait problem.  Skin:  Positive for color change. Negative for rash.  Neurological: Negative.  Negative for dizziness, weakness and  light-headedness.  Hematological:  Negative for adenopathy. Does not bruise/bleed easily.  Psychiatric/Behavioral: Negative.      Objective:  BP 104/60 (BP Location: Right Arm, Patient Position: Sitting, Cuff Size: Normal)   Pulse (!) 51   Temp 98.6 F (37 C) (Oral)   Ht 5\' 10"  (1.778 m)   Wt 236 lb (107 kg)   SpO2 99%   BMI 33.86 kg/m   BP Readings from Last 3 Encounters:  07/02/23 104/60  06/19/23 97/64   06/06/23 122/66    Wt Readings from Last 3 Encounters:  07/02/23 236 lb (107 kg)  06/19/23 235 lb (106.6 kg)  06/02/23 242 lb 4.6 oz (109.9 kg)    Physical Exam Vitals reviewed.  Constitutional:      Appearance: He is not ill-appearing.  HENT:     Nose: Nose normal.     Mouth/Throat:     Mouth: Mucous membranes are moist.  Eyes:     General: No scleral icterus.    Conjunctiva/sclera: Conjunctivae normal.  Cardiovascular:     Rate and Rhythm: Bradycardia present. Frequent Extrasystoles are present.    Heart sounds: No murmur heard.    No friction rub. No gallop.     Comments: EKG--- SB with PAC's, 54 bpm (new) No LVH, Q waves, or ST/T wave changes  Pulmonary:     Effort: Pulmonary effort is normal.     Breath sounds: No stridor. No wheezing, rhonchi or rales.  Abdominal:     General: Abdomen is protuberant. Bowel sounds are normal. There is no distension.     Palpations: Abdomen is soft. There is no hepatomegaly, splenomegaly or mass.     Tenderness: There is abdominal tenderness in the left lower quadrant. There is no guarding or rebound.     Hernia: No hernia is present.  Musculoskeletal:        General: Normal range of motion.     Cervical back: Neck supple.     Right lower leg: No edema.     Left lower leg: No edema.  Lymphadenopathy:     Cervical: No cervical adenopathy.  Skin:    General: Skin is warm and dry.     Findings: Erythema present. No rash.  Neurological:     General: No focal deficit present.     Mental Status: He is alert.  Psychiatric:        Mood and Affect: Mood normal.        Behavior: Behavior normal.     Lab Results  Component Value Date   WBC 7.4 06/04/2023   HGB 13.9 06/04/2023   HCT 42.6 06/04/2023   PLT 548 (H) 06/04/2023   GLUCOSE 101 (H) 07/02/2023   CHOL 110 06/02/2023   TRIG 83 06/02/2023   HDL 29 (L) 06/02/2023   LDLCALC 64 06/02/2023   ALT 19 06/04/2023   AST 22 06/04/2023   NA 139 07/02/2023   K 4.4 07/02/2023    CL 98 07/02/2023   CREATININE 0.95 07/02/2023   BUN 12 07/02/2023   CO2 32 07/02/2023   TSH 2.222 06/01/2023   PSA 1.48 08/02/2022   INR 1.3 (H) 05/27/2023   HGBA1C 5.6 06/02/2023    VAS US  LOWER EXTREMITY VENOUS (DVT) Result Date: 06/03/2023  Lower Venous DVT Study Patient Name:  Jonathan Powers  Date of Exam:   06/02/2023 Medical Rec #: [id number]         Accession #:    [id number] Date of Birth: [birth year]  Patient Gender: M Patient Age:   30 years Exam Location:  [facility] Procedure:      VAS US  LOWER EXTREMITY VENOUS (DVT) Referring Phys: Arnulfo Larch --------------------------------------------------------------------------------  Indications: Edema, cellulitis, and Erythema.  Comparison Study: Previous study on 4.6.2025. Performing Technologist: Ria Chad  Examination Guidelines: A complete evaluation includes B-mode imaging, spectral Doppler, color Doppler, and power Doppler as needed of all accessible portions of each vessel. Bilateral testing is considered an integral part of a complete examination. Limited examinations for reoccurring indications may be performed as noted. The reflux portion of the exam is performed with the patient in reverse Trendelenburg.  +---------+---------------+---------+-----------+----------+--------------+ RIGHT    CompressibilityPhasicitySpontaneityPropertiesThrombus Aging +---------+---------------+---------+-----------+----------+--------------+ CFV      Full           Yes      Yes                                 +---------+---------------+---------+-----------+----------+--------------+ SFJ      Full           Yes      Yes                                 +---------+---------------+---------+-----------+----------+--------------+ FV Prox  Full                                                        +---------+---------------+---------+-----------+----------+--------------+ FV Mid   Full                                                         +---------+---------------+---------+-----------+----------+--------------+ FV DistalFull                                                        +---------+---------------+---------+-----------+----------+--------------+ PFV      Full                                                        +---------+---------------+---------+-----------+----------+--------------+ POP      Full           Yes      Yes                                 +---------+---------------+---------+-----------+----------+--------------+ PTV      Full                                                        +---------+---------------+---------+-----------+----------+--------------+ PERO  Full                                                        +---------+---------------+---------+-----------+----------+--------------+   +----+---------------+---------+-----------+----------+--------------+ LEFTCompressibilityPhasicitySpontaneityPropertiesThrombus Aging +----+---------------+---------+-----------+----------+--------------+ CFV Full           Yes      Yes                                 +----+---------------+---------+-----------+----------+--------------+ SFJ Full           Yes      Yes                                 +----+---------------+---------+-----------+----------+--------------+     Summary: RIGHT: - There is no evidence of deep vein thrombosis in the lower extremity.  - No cystic structure found in the popliteal fossa.  LEFT: - No evidence of common femoral vein obstruction.   *See table(s) above for measurements and observations. Electronically signed by Irvin Mantel on 06/03/2023 at 9:29:46 AM.    Final    DG ABD ACUTE 2+V W 1V CHEST Result Date: 07/02/2023 CLINICAL DATA:  Left lower quadrant abdominal pain EXAM: DG ABDOMEN ACUTE WITH 1 VIEW CHEST COMPARISON:  May 27, 2023 FINDINGS: There is no evidence of dilated bowel loops or free  intraperitoneal air. No radiopaque calculi or other significant radiographic abnormality is seen. Heart size and mediastinal contours are within normal limits. Both lungs are clear. IMPRESSION: Negative abdominal radiographs. No acute cardiopulmonary disease. Bilateral hip arthroplasties Electronically Signed   By: Fredrich Jefferson M.D.   On: 07/02/2023 15:07    CT ABDOMEN PELVIS W CONTRAST Result Date: 07/05/2023 CLINICAL DATA:  Several month history of left lower quadrant pain. Intentional weight loss. EXAM: CT ABDOMEN AND PELVIS WITH CONTRAST TECHNIQUE: Multidetector CT imaging of the abdomen and pelvis was performed using the standard protocol following bolus administration of intravenous contrast. RADIATION DOSE REDUCTION: This exam was performed according to the departmental dose-optimization program which includes automated exposure control, adjustment of the mA and/or kV according to patient size and/or use of iterative reconstruction technique. CONTRAST:  ISOVUE -300 IOPAMIDOL  (ISOVUE -300) INJECTION 61% COMPARISON:  CT abdomen and pelvis dated 05/27/2023 FINDINGS: Lower chest: No focal consolidation or pulmonary nodule in the lung bases. No pleural effusion or pneumothorax demonstrated. Partially imaged heart size is normal. Hepatobiliary: No focal hepatic lesions. No intra or extrahepatic biliary ductal dilation. Cholecystectomy. Pancreas: No focal lesions or main ductal dilation. Spleen: Normal in size without focal abnormality. Adrenals/Urinary Tract: No adrenal nodules. No suspicious renal mass, calculi or hydronephrosis. Segmental mild dilation of the distal left ureter measuring up to 7 mm (2:62). Suboptimal evaluation of bilateral distal ureters and ureterovesical junctions due to beam hardening artifact from hip arthroplasties. No focal bladder wall thickening in the visualized portions. Stomach/Bowel: Normal appearance of the stomach. No evidence of bowel wall thickening, distention, or  inflammatory changes. Moderate volume stool throughout the colon. Single colonic diverticulum along the descending colon. Diverticulosis without acute diverticulitis. Normal appendix. Vascular/Lymphatic: Aortic atherosclerosis. No enlarged abdominal or pelvic lymph nodes. Reproductive: Suboptimal evaluation of prostate gland due to beam hardening artifact from hip arthroplasties. Other: No free fluid, fluid collection, or  free air. Musculoskeletal: No acute or abnormal lytic or blastic osseous lesions. Unchanged well-circumscribed lucent lesion within the left posterior ilium (2:58), dating back to at least 2021, likely benign. Multilevel degenerative changes of the partially imaged thoracic and lumbar spine. Small fat-containing bilateral inguinal hernias. IMPRESSION: 1. No acute abdominopelvic findings. 2. Segmental mild dilation of the distal left ureter measuring up to 7 mm, which may be due to peristalsis. Consider further evaluation with nonemergent contrast-enhanced CT urogram with metal artifact reduction. 3. Moderate volume stool throughout the colon. 4. Aortic Atherosclerosis (ICD10-I70.0). Electronically Signed   By: Limin  Xu M.D.   On: 07/05/2023 11:21     Assessment & Plan:  Bradycardia -     EKG 12-Lead  Idiopathic hypotension -     Basic metabolic panel with GFR; Future -     Urinalysis, Routine w reflex microscopic; Future -     Cortisol; Future  Left lower quadrant abdominal tenderness without rebound tenderness- Lipase is elevated. Will evaluate for pancreatitis, mass, malignancy, occult abscess, and other abdominal processes. -     Urinalysis, Routine w reflex microscopic; Future -     DG ABD ACUTE 2+V W 1V CHEST; Future -     Lipase; Future -     C-reactive protein; Future -     CT ABDOMEN PELVIS W CONTRAST; Future  Essential hypertension- BP is over controlled. Will hold the anti-hypertensive meds. -     Basic metabolic panel with GFR; Future  Elevated lipase -     CT  ABDOMEN PELVIS W CONTRAST; Future  Weight loss, unintentional -     CT ABDOMEN PELVIS W CONTRAST; Future     Follow-up: Return in about 3 months (around 10/02/2023).  Sandra Crouch, MD

## 2023-07-04 ENCOUNTER — Other Ambulatory Visit

## 2023-07-05 ENCOUNTER — Ambulatory Visit: Payer: Self-pay | Admitting: Internal Medicine

## 2023-07-05 ENCOUNTER — Ambulatory Visit
Admission: RE | Admit: 2023-07-05 | Discharge: 2023-07-05 | Disposition: A | Discharge status: Home / Self Care | Source: Ambulatory Visit | Attending: Internal Medicine | Admitting: Internal Medicine

## 2023-07-05 DIAGNOSIS — R748 Abnormal levels of other serum enzymes: Secondary | ICD-10-CM

## 2023-07-05 DIAGNOSIS — R634 Abnormal weight loss: Secondary | ICD-10-CM | POA: Diagnosis not present

## 2023-07-05 DIAGNOSIS — R10814 Left lower quadrant abdominal tenderness: Secondary | ICD-10-CM | POA: Diagnosis not present

## 2023-07-05 MED ORDER — IOPAMIDOL (ISOVUE-300) INJECTION 61%
100.0000 mL | Freq: Once | INTRAVENOUS | Status: AC | PRN
  Administered 2023-07-05: 100 mL via INTRAVENOUS

## 2023-07-06 ENCOUNTER — Encounter (HOSPITAL_COMMUNITY)

## 2023-07-07 ENCOUNTER — Encounter: Payer: Self-pay | Admitting: Internal Medicine

## 2023-07-10 ENCOUNTER — Ambulatory Visit (HOSPITAL_COMMUNITY)
Admission: RE | Admit: 2023-07-10 | Discharge: 2023-07-10 | Disposition: A | Discharge status: Home / Self Care | Source: Ambulatory Visit | Attending: Vascular Surgery | Admitting: Vascular Surgery

## 2023-07-10 DIAGNOSIS — I872 Venous insufficiency (chronic) (peripheral): Secondary | ICD-10-CM | POA: Diagnosis not present

## 2023-07-12 ENCOUNTER — Ambulatory Visit: Admitting: Internal Medicine

## 2023-07-12 ENCOUNTER — Other Ambulatory Visit: Payer: Self-pay

## 2023-07-12 ENCOUNTER — Encounter: Payer: Self-pay | Admitting: Internal Medicine

## 2023-07-12 VITALS — BP 124/79 | HR 62 | Resp 16 | Ht 70.0 in | Wt 248.0 lb

## 2023-07-12 DIAGNOSIS — L03116 Cellulitis of left lower limb: Secondary | ICD-10-CM | POA: Diagnosis not present

## 2023-07-12 MED ORDER — LINEZOLID 600 MG PO TABS: 600.0000 mg | ORAL_TABLET | Freq: Two times a day (BID) | ORAL | 0 refills | Status: AC

## 2023-07-12 NOTE — Progress Notes (Unsigned)
 Patient Active Problem List   Diagnosis Date Noted   Idiopathic hypotension 07/02/2023   Bradycardia 07/02/2023   Left lower quadrant abdominal tenderness without rebound tenderness 07/02/2023   Weight loss, unintentional 07/02/2023   Elevated lipase 07/02/2023   Elevated troponin 06/02/2023   Recurrent cellulitis 05/29/2023   Onychomycosis 05/29/2023   Tinea pedis 05/29/2023   Dementia (HCC) 05/29/2023   Cellulitis 05/27/2023   Dyslipidemia, goal LDL below 100 01/22/2023   Need for prophylactic vaccination and inoculation against varicella 01/16/2023   Mild neurocognitive disorder due to Alzheimer's disease 09/20/2022   Exocrine pancreatic insufficiency 12/03/2021   Lung nodule 07/25/2021   Hypothyroidism 11/11/2018   Insomnia 11/10/2018   GERD (gastroesophageal reflux disease) 11/10/2018   Age related osteoporosis 03/11/2018   Flexural eczema 07/24/2017   Chronic right-sided low back pain without sciatica 07/21/2016   Superior mesenteric artery aneurysm 07/12/2015   Left thyroid  nodule    OSA (obstructive sleep apnea) 01/27/2013   Symptomatic PVCs 09/06/2012   BPH (benign prostatic hyperplasia) 09/06/2012   Obesity (BMI 30-39.9) 05/17/2006   Essential hypertension 05/17/2006   Allergic rhinitis 05/17/2006   Primary osteoarthritis 05/17/2006    Patient's Medications  New Prescriptions   No medications on file  Previous Medications   ACETAMINOPHEN  (TYLENOL ) 325 MG TABLET    Take 2 tablets (650 mg total) by mouth every 6 (six) hours as needed for mild pain (pain score 1-3), fever, headache or moderate pain (pain score 4-6).   CETIRIZINE (ZYRTEC) 10 MG TABLET    Take 10 mg by mouth daily as needed for allergies.   LEVOTHYROXINE  (SYNTHROID ) 175 MCG TABLET    TAKE 1 TABLET BY MOUTH ONCE DAILY BEFORE BREAKFAST   MEMANTINE  (NAMENDA ) 10 MG TABLET    Take 1 tablet (10 mg total) by mouth 2 (two) times daily.   OVER THE COUNTER MEDICATION    Take by mouth daily.  Plasmologens.   ROSUVASTATIN  (CRESTOR ) 5 MG TABLET    Take 1 tablet (5 mg total) by mouth daily.   TESTOSTERONE  1.62 % GEL    Apply 2 Pump topically in the morning.  Modified Medications   No medications on file  Discontinued Medications   No medications on file    Subjective: 73 year old male with past medical history as below presents for hospital follow-up of cellulitis of right leg versus recurrence.  He was discharged on linezolid  to complete 10-day course EOT 07/12/2023.  He reports that his cellulitis symptoms significantly improved.  No new complaints. Please see HPI from 06/03/2023 for further details.  Today 5/22: Pt states he started linezolid  since last visit. Doing better overall. Review of Systems: Review of Systems  All other systems reviewed and are negative.   Past Medical History:  Diagnosis Date   Acute recurrent pansinusitis 02/17/2021   Age related osteoporosis 03/11/2018   Allergic rhinitis 05/17/2006   BPH (benign prostatic hyperplasia) 09/06/2012   Cellulitis of left lower extremity 11/10/2018   Cellulitis, right leg    Recurrent R hip cellulitis 1/08,3/08    Chronic fatigue, unspecified    Chronic hyperglycemia 06/29/2020   Chronic right-sided low back pain without sciatica 07/21/2016   Chronic sinusitis 07/10/2021   Deviated septum 02/17/2021   Diverticulitis    Essential hypertension 05/17/2006   2014     Impression  Exercise Capacity:  Lexiscan  with low level exercise.  BP Response:  Normal blood pressure response.  Clinical Symptoms:  No significant symptoms noted.  ECG Impression:  No significant ST segment change suggestive of ischemia.  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   Flexural eczema 07/24/2017   Generalized abdominal tenderness with rebound  tenderness 08/02/2022   GERD (gastroesophageal reflux disease)    Gout    Hashimoto's thyroiditis    History of skin cancer    Melanoma on back   Hypogonadism male    low T, a/w ED   Hypothyroidism 11/11/2018   Idiopathic urticaria    Insomnia 11/10/2018   Left flank pain 08/02/2022   Left thyroid  nodule 2008   on US , decrease size in 2011 US    Lung nodule 07/25/2021   Migraines    Atypical and ocular   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 No acute findings.  2. 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  3. Ectatic abdominal aorta at risk for aneurysm development.  Recommend followup by ultrasound in 5 years. This recommendation  follows ACR consensus guidelines: White Paper of the ACR Incidental  Findings Committee II on Vascular Findings. J Am Coll Radiol 2013;  10:789-794.  4. 1.   Symptomatic PVCs 09/06/2012    Social History   Tobacco Use   Smoking status: Never    Passive exposure: Never   Smokeless tobacco: Never  Vaping Use   Vaping status: Never Used  Substance Use Topics   Alcohol use: Not Currently    Comment: rare   Drug use: No    Family History  Problem Relation Age of Onset   Arthritis Father    Heart disease Father    Diabetes Father    Vascular Disease Father        AoBifem   Arthritis Mother    Diabetes Mother    Multiple sclerosis Mother    Cancer Paternal Grandmother        "GI"    Cancer Paternal Grandfather        stomach cancer   Thyroid  cancer Sister    Uterine cancer Sister     Allergies  Allergen Reactions   Nsaids Shortness Of Breath    Naproxen specifically causes SOB/wheeze tolerates topical diclofenac  without problem Tylenol  OK   Tolmetin Shortness Of Breath    Naproxen specifically causes SOB/wheeze tolerates topical diclofenac  without problem Tylenol  OK    Health Maintenance  Topic Date Due   Zoster Vaccines- Shingrix  (1 of 2) Never done   INFLUENZA VACCINE  09/21/2023   Medicare Annual Wellness (AWV)  02/19/2024   Colonoscopy  03/09/2025   DTaP/Tdap/Td (2 - Td or Tdap) 07/11/2025   Pneumonia Vaccine 59+ Years old  Completed   Hepatitis C Screening  Completed   HPV VACCINES  Aged Out   Meningococcal B  Vaccine  Aged Out   COVID-19 Vaccine  Discontinued    Objective:  Vitals:   07/12/23 1452  BP: 124/79  Pulse: 62  Resp: 16  SpO2: 98%  Weight: 248 lb (112.5 kg)  Height: 5\' 10"  (1.778 m)   Body mass index is 35.58 kg/m.  Physical Exam Constitutional:      General: He is not in acute distress.    Appearance: He is normal weight. He is not toxic-appearing.  HENT:     Head: Normocephalic and atraumatic.     Right Ear: External ear normal.     Left Ear: External ear normal.     Nose: No congestion or rhinorrhea.     Mouth/Throat:     Mouth: Mucous membranes are moist.     Pharynx: Oropharynx is clear.  Eyes:     Extraocular Movements: Extraocular movements intact.     Conjunctiva/sclera: Conjunctivae normal.     Pupils: Pupils are equal, round, and reactive to light.  Cardiovascular:     Rate and Rhythm: Normal rate and regular rhythm.     Heart sounds: No murmur heard.    No friction rub. No gallop.  Pulmonary:     Effort: Pulmonary effort is normal.     Breath sounds: Normal breath sounds.  Abdominal:     General: Abdomen is flat. Bowel sounds are normal.     Palpations: Abdomen is soft.  Musculoskeletal:         General: No swelling. Normal range of motion.     Cervical back: Normal range of motion and neck supple.  Skin:    General: Skin is warm and dry.  Neurological:     General: No focal deficit present.     Mental Status: He is oriented to person, place, and time.  Psychiatric:        Mood and Affect: Mood normal.    Physical Exam   Lab Results Lab Results  Component Value Date   WBC 7.4 06/04/2023   HGB 13.9 06/04/2023   HCT 42.6 06/04/2023   MCV 93.4 06/04/2023   PLT 548 (H) 06/04/2023    Lab Results  Component Value Date   CREATININE 0.95 07/02/2023   BUN 12 07/02/2023   NA 139 07/02/2023   K 4.4 07/02/2023   CL 98 07/02/2023   CO2 32 07/02/2023    Lab Results  Component Value Date   ALT 19 06/04/2023   AST 22 06/04/2023   ALKPHOS 54 06/04/2023   BILITOT 0.8 06/04/2023    Lab Results  Component Value Date   CHOL 110 06/02/2023   HDL 29 (L) 06/02/2023   LDLCALC 64 06/02/2023   TRIG 83 06/02/2023   CHOLHDL 3.8 06/02/2023   Lab Results  Component Value Date   LABRPR NON-REACTIVE 06/29/2020   No results found for: "HIV1RNAQUANT", "HIV1RNAVL", "CD4TABS"   Problem List Items Addressed This Visit   None  Results   Assessment/Plan #Relapsed cellulitis of right leg versus recurrent, nonpurulent  -Lineozlid started Monday due to RLE erythema. Now improved.No fever or chills reported -Vascular appt comming up-> for possible optimization -I think ok to do lineolid x 7 day if c/f infeciton and follow up in clinic as we did this time.  -F/U with ID PRN   #Onychomycosis - On terbinafine  x 12 weeks - F/u with podiatry  Orlie Bjornstad, MD [facility]  I have personally spent 35 minutes involved in face-to-face and non-face-to-face activities for this patient on  the day of the visit. Professional time spent includes the following activities: Preparing to see the patient (review of tests), Obtaining and/or reviewing separately obtained  history (admission/discharge record), Performing a medically appropriate examination and/or evaluation , Ordering medications/tests/procedures, referring and communicating with other health care professionals, Documenting clinical information in the EMR, Independently interpreting results (not separately reported), Communicating results to the patient/family/caregiver, Counseling and educating the patient/family/caregiver and Care coordination (not separately reported).   [facility] 07/12/2023, 3:08 PM

## 2023-07-20 DIAGNOSIS — N139 Obstructive and reflux uropathy, unspecified: Secondary | ICD-10-CM | POA: Diagnosis not present

## 2023-07-23 ENCOUNTER — Encounter: Payer: Self-pay | Admitting: Physician Assistant

## 2023-07-23 ENCOUNTER — Other Ambulatory Visit: Payer: Self-pay | Admitting: Internal Medicine

## 2023-07-23 ENCOUNTER — Ambulatory Visit: Attending: Surgery | Admitting: Physician Assistant

## 2023-07-23 VITALS — BP 149/80 | HR 54 | Temp 98.7°F | Ht 70.0 in | Wt 245.9 lb

## 2023-07-23 DIAGNOSIS — M7989 Other specified soft tissue disorders: Secondary | ICD-10-CM

## 2023-07-23 DIAGNOSIS — L0390 Cellulitis, unspecified: Secondary | ICD-10-CM | POA: Diagnosis not present

## 2023-07-23 DIAGNOSIS — E785 Hyperlipidemia, unspecified: Secondary | ICD-10-CM

## 2023-07-23 DIAGNOSIS — I872 Venous insufficiency (chronic) (peripheral): Secondary | ICD-10-CM

## 2023-07-23 NOTE — Progress Notes (Signed)
 Requested by:  Osborn Blaze, MD [address] [place],  [place] [address]  Reason for consultation: RLE recurrent cellulitis    History of Present Illness   Jonathan Powers is a 73 y.o. ([birth year]) male who presents for evaluation of recurrent cellulitis. He is here with his daughter today. He reports recurrent episodes of cellulitis over past 5 years. Initially it occurred in left leg but more recent episodes have been in the right leg. He has required hospitalization on several occasions for IV antibiotics. Present his symptoms are resolved. He does report some regular swelling in his legs. Has had some associated tightness and some maceration between his toes. Was told this is likely how his skin barrier was compromised leading to the cellulitis. He denies any associated pain. He does have some tenderness/ discomfort in left ankle but has history of ankle surgery. He also has some aching mostly weather related, which he attributes to arthritis. No throbbing, burning, itching, bleeding or ulceration. He stays fairly active. Walks without pain. No pain at rest or tissue loss. He does not regularly elevate. Has never worn compression stockings before. He has no family history of DVT or venous disease.   Venous symptoms include: aching, swelling, redness Onset/duration:  5 years  Occupation:  retired orthopedic PA Aggravating factors: none Alleviating factors: none Compression:  no Helps:  n/a Pain medications:  no Previous vein procedures:  no History of DVT:  no  Past Medical History:  Diagnosis Date   Acute recurrent pansinusitis 02/17/2021   Age related osteoporosis 03/11/2018   Allergic rhinitis 05/17/2006   BPH (benign prostatic hyperplasia) 09/06/2012   Cellulitis of left lower extremity 11/10/2018   Cellulitis, right leg    Recurrent R hip cellulitis 1/08,3/08    Chronic fatigue, unspecified    Chronic hyperglycemia 06/29/2020   Chronic right-sided low  back pain without sciatica 07/21/2016   Chronic sinusitis 07/10/2021   Deviated septum 02/17/2021   Diverticulitis    Essential hypertension 05/17/2006   2014     Impression  Exercise Capacity:  Lexiscan  with low level exercise.  BP Response:  Normal blood pressure response.  Clinical Symptoms:  No significant symptoms noted.  ECG Impression:  No significant ST segment change suggestive of ischemia.  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   Flexural eczema 07/24/2017   Generalized abdominal tenderness with rebound tenderness 08/02/2022   GERD (gastroesophageal reflux disease)    Gout    Hashimoto's thyroiditis    History of skin cancer    Melanoma on back   Hypogonadism male    low T, a/w ED   Hypothyroidism 11/11/2018   Idiopathic urticaria    Insomnia 11/10/2018   Left flank pain 08/02/2022   Left thyroid  nodule 2008   on US , decrease size in 2011 US    Lung nodule 07/25/2021   Migraines    Atypical and ocular   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   09/2015 compliance report> > 4 hours 80% of days, used 25/30 days   Primary osteoarthritis 05/17/2006   Rash 07/22/2022  Reactive thrombocytosis 09/17/2021   Recurrent genital HSV (herpes simplex virus) infection 11/12/2018   Rib fracture 07/22/2022   Superior mesenteric artery aneurysm 07/12/2015   DEC 2019     IMPRESSION:  VASCULAR     1. No acute findings.  2. 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  3. Ectatic abdominal aorta at risk for aneurysm development.  Recommend followup by ultrasound in 5 years. This  recommendation  follows ACR consensus guidelines: White Paper of the ACR Incidental  Findings Committee II on Vascular Findings. J Am Coll Radiol 2013;  [phone].  4. 1.   Symptomatic PVCs 09/06/2012    Past Surgical History:  Procedure Laterality Date   CARPAL TUNNEL RELEASE Left 03/30/2017   Procedure: CARPAL TUNNEL RELEASE;  Surgeon: Shirlee Dotter, MD;  Location: [facility];  Service: Orthopedics;  Laterality: Left;   CHOLECYSTECTOMY  2004   CTR Bilateral 1982   CYSTOSCOPY  1974   FOOT FUSION Left 06/22/2017   HEMORRHOID SURGERY N/A 03/30/2017   Procedure: HEMORRHOIDECTOMY;  Surgeon: Oza Blumenthal, MD;  Location: [facility];  Service: General;  Laterality: N/A;   open reduction L little finger Left 1970   Repair digital thumb, left Left 1990   Right shoulder cuff repair Right 09/15/11   Right shoulder SAD, DCR Right 02/05/11   TOTAL HIP ARTHROPLASTY Left 07/26/06   TOTAL HIP ARTHROPLASTY Right 1999   TOTAL HIP REVISION Left 03/07/2019   Procedure: LEFT TOTAL HIP REVISION POSTERIOR APPROACH;  Surgeon: Wendolyn Hamburger, MD;  Location: [facility];  Service: Orthopedics;  Laterality: Left;    Social History   Socioeconomic History   Marital status: Married    Spouse name: Cain Castillo   Number of children: 3   Years of education: 18   Highest education level: Not on file  Occupational History   Occupation: Retired    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   Social Drivers of Corporate investment banker  Strain: Low Risk  (02/19/2023)   Overall Financial Resource Strain (CARDIA)    Difficulty of Paying Living Expenses: Not hard at all  Food Insecurity: No Food Insecurity (06/02/2023)   Hunger Vital Sign    Worried About Running Out of Food in the Last Year: Never true    Ran Out of Food in the Last Year: Never true  Transportation Needs: No Transportation Needs (06/02/2023)   PRAPARE - Administrator, Civil Service (Medical): No    Lack of Transportation (Non-Medical): No  Physical Activity: Sufficiently Active (02/19/2023)   Exercise Vital Sign    Days of Exercise per Week: 7 days    Minutes of Exercise per Session: 40 min  Stress: No Stress Concern Present (02/19/2023)   [employer] of Occupational Health - Occupational Stress Questionnaire    Feeling of Stress : Not at all  Social Connections: Unknown (  06/02/2023)   Social Connection and Isolation Panel [NHANES]    Frequency of Communication with Friends and Family: Twice a week    Frequency of Social Gatherings with Friends and Family: Twice a week    Attends Religious Services: 1 to 4 times per year    Active Member of [employer] or Organizations: Patient declined    Attends Banker Meetings: Patient declined    Marital Status: Married  Catering manager Violence: Not At Risk (06/02/2023)   Humiliation, Afraid, Rape, and Kick questionnaire    Fear of Current or Ex-Partner: No    Emotionally Abused: No    Physically Abused: No    Sexually Abused: No    Family History  Problem Relation Age of Onset   Arthritis Father    Heart disease Father    Diabetes Father    Vascular Disease Father        AoBifem   Arthritis Mother    Diabetes Mother    Multiple sclerosis Mother    Cancer Paternal Grandmother        "GI"   Cancer Paternal Grandfather        stomach cancer   Thyroid  cancer Sister    Uterine cancer Sister     Current Outpatient Medications  Medication Sig Dispense Refill   acetaminophen   (TYLENOL ) 325 MG tablet Take 2 tablets (650 mg total) by mouth every 6 (six) hours as needed for mild pain (pain score 1-3), fever, headache or moderate pain (pain score 4-6).     cetirizine (ZYRTEC) 10 MG tablet Take 10 mg by mouth daily as needed for allergies.     levothyroxine  (SYNTHROID ) 175 MCG tablet TAKE 1 TABLET BY MOUTH ONCE DAILY BEFORE BREAKFAST 90 tablet 0   memantine  (NAMENDA ) 10 MG tablet Take 1 tablet (10 mg total) by mouth 2 (two) times daily. 60 tablet 11   OVER THE COUNTER MEDICATION Take by mouth daily. Plasmologens.     rosuvastatin  (CRESTOR ) 5 MG tablet Take 1 tablet (5 mg total) by mouth daily. 90 tablet 1   Testosterone  1.62 % GEL Apply 2 Pump topically in the morning.     No current facility-administered medications for this visit.    Allergies  Allergen Reactions   Nsaids Shortness Of Breath    Naproxen specifically causes SOB/wheeze tolerates topical diclofenac  without problem Tylenol  OK   Tolmetin Shortness Of Breath    Naproxen specifically causes SOB/wheeze tolerates topical diclofenac  without problem Tylenol  OK    REVIEW OF SYSTEMS (negative unless checked):   Cardiac:  []  Chest pain or chest pressure? []  Shortness of breath upon activity? []  Shortness of breath when lying flat? []  Irregular heart rhythm?  Vascular:  []  Pain in calf, thigh, or hip brought on by walking? []  Pain in feet at night that wakes you up from your sleep? []  Blood clot in your veins? [x]  Leg swelling?  Pulmonary:  []  Oxygen at home? []  Productive cough? []  Wheezing?  Neurologic:  []  Sudden weakness in arms or legs? []  Sudden numbness in arms or legs? []  Sudden onset of difficult speaking or slurred speech? []  Temporary loss of vision in one eye? []  Problems with dizziness?  Gastrointestinal:  []  Blood in stool? []  Vomited blood?  Genitourinary:  []  Burning when urinating? []  Blood in urine?  Psychiatric:  []  Major depression  Hematologic:  []  Bleeding  problems? []  Problems with blood clotting?  Dermatologic:  []  Rashes or ulcers?  Constitutional:  []  Fever or chills?  Ear/Nose/Throat:  []  Change in hearing? []  Nose bleeds? []  Sore throat?  Musculoskeletal:  []  Back pain? []  Joint pain? []  Muscle pain?   Physical Examination     Vitals:   07/23/23 0846  BP: (!) 149/80  Pulse: (!) 54  Temp: 98.7 F (37.1 C)  TempSrc: Temporal  SpO2: 94%  Weight: 245 lb 14.4 oz (111.5 kg)  Height: 5\' 10"  (1.778 m)   Body mass index is 35.28 kg/m.  General:  WDWN in NAD; vital signs documented above Gait: Normal HENT: WNL, normocephalic Pulmonary: normal non-labored breathing Cardiac: regular HR Abdomen: soft Vascular Exam/Pulses: 2+ radial pulses, 2+ femoral, 2+ popliteal, 2+ DP and PT pulses bilaterally. Feet warm and well perfused Extremities: without varicose veins, without reticular veins, with mild edema, without stasis pigmentation, without lipodermatosclerosis, without ulcers Musculoskeletal: no muscle wasting or atrophy  Neurologic: A&O X 3;  No focal weakness or paresthesias are detected Psychiatric:  The pt has Normal affect.  Non-invasive Vascular Imaging   BLE Venous Insufficiency Duplex (07/10/23):  RLE:  No DVT and SVT,  GSV reflux SFJ, prox thigh GSV diameter 0.33-0.61 cm No SSV reflux  No deep venous reflux  Medical Decision Making   ARNALDO HEFFRON is a 73 y.o. male who presents with: RLE chronic venous insufficiency with recurrent episodes of cellulitis. Presently without any active cellulitis. Recent duplex shows no DVT. He has a little venous reflux in the proximal GSV. The GSV is small. He has no SSV or Deep Vein reflux. Based on this duplex he is not candidate for venous lazer ablation.He would best be managed with conservative therapy.  Based on the patient's history and examination, I recommend: daily elevation of 20-30 minutes above level of heart, daily compression stocking use, exercise, weight  reduction, refraining from prolonged sitting or standing I discussed with the patient the use of 15-20 mm knee high compression stockings  Patient was provided with information about vein health He can follow up as needed if he has any new or worsening symptoms   Deneen Finical, PA-C [facility] Office: [phone]  07/23/2023, 9:12 AM  Clinic MD: Charlotte Cookey

## 2023-08-08 DIAGNOSIS — L570 Actinic keratosis: Secondary | ICD-10-CM | POA: Diagnosis not present

## 2023-08-08 DIAGNOSIS — L821 Other seborrheic keratosis: Secondary | ICD-10-CM | POA: Diagnosis not present

## 2023-08-08 DIAGNOSIS — L720 Epidermal cyst: Secondary | ICD-10-CM | POA: Diagnosis not present

## 2023-08-08 DIAGNOSIS — D225 Melanocytic nevi of trunk: Secondary | ICD-10-CM | POA: Diagnosis not present

## 2023-08-10 ENCOUNTER — Encounter: Payer: Self-pay | Admitting: Physician Assistant

## 2023-08-17 ENCOUNTER — Encounter: Payer: Self-pay | Admitting: Physician Assistant

## 2023-09-15 ENCOUNTER — Other Ambulatory Visit: Payer: Self-pay | Admitting: Internal Medicine

## 2023-09-15 DIAGNOSIS — G4700 Insomnia, unspecified: Secondary | ICD-10-CM

## 2023-09-16 ENCOUNTER — Other Ambulatory Visit: Payer: Self-pay | Admitting: Internal Medicine

## 2023-09-16 DIAGNOSIS — E039 Hypothyroidism, unspecified: Secondary | ICD-10-CM

## 2023-10-02 ENCOUNTER — Emergency Department (HOSPITAL_COMMUNITY)
Admission: EM | Admit: 2023-10-02 | Discharge: 2023-10-03 | Discharge status: Against Medical Advice | Attending: Emergency Medicine | Admitting: Emergency Medicine

## 2023-10-02 ENCOUNTER — Other Ambulatory Visit: Payer: Self-pay

## 2023-10-02 ENCOUNTER — Encounter (HOSPITAL_COMMUNITY): Payer: Self-pay

## 2023-10-02 DIAGNOSIS — R001 Bradycardia, unspecified: Secondary | ICD-10-CM | POA: Diagnosis not present

## 2023-10-02 DIAGNOSIS — R4182 Altered mental status, unspecified: Secondary | ICD-10-CM | POA: Diagnosis not present

## 2023-10-02 DIAGNOSIS — F0390 Unspecified dementia without behavioral disturbance: Secondary | ICD-10-CM | POA: Insufficient documentation

## 2023-10-02 DIAGNOSIS — Z8659 Personal history of other mental and behavioral disorders: Secondary | ICD-10-CM | POA: Diagnosis not present

## 2023-10-02 DIAGNOSIS — I959 Hypotension, unspecified: Secondary | ICD-10-CM | POA: Diagnosis not present

## 2023-10-02 DIAGNOSIS — K625 Hemorrhage of anus and rectum: Secondary | ICD-10-CM | POA: Diagnosis not present

## 2023-10-02 DIAGNOSIS — Z5321 Procedure and treatment not carried out due to patient leaving prior to being seen by health care provider: Secondary | ICD-10-CM | POA: Insufficient documentation

## 2023-10-02 LAB — COMPREHENSIVE METABOLIC PANEL WITH GFR
ALT: 16 U/L (ref 0–44)
AST: 18 U/L (ref 15–41)
Albumin: 4 g/dL (ref 3.5–5.0)
Alkaline Phosphatase: 52 U/L (ref 38–126)
Anion gap: 10 (ref 5–15)
BUN: 14 mg/dL (ref 8–23)
CO2: 24 mmol/L (ref 22–32)
Calcium: 9.4 mg/dL (ref 8.9–10.3)
Chloride: 102 mmol/L (ref 98–111)
Creatinine, Ser: 0.92 mg/dL (ref 0.61–1.24)
GFR, Estimated: >60 mL/min (ref >60)
Glucose, Bld: 107 mg/dL — ABNORMAL HIGH (ref 70–99)
Potassium: 3.9 mmol/L (ref 3.5–5.1)
Sodium: 136 mmol/L (ref 135–145)
Total Bilirubin: 1.1 mg/dL (ref 0.0–1.2)
Total Protein: 7.3 g/dL (ref 6.5–8.1)

## 2023-10-02 LAB — URINALYSIS, ROUTINE W REFLEX MICROSCOPIC
Bilirubin Urine: NEGATIVE
Glucose, UA: NEGATIVE mg/dL
Hgb urine dipstick: NEGATIVE
Ketones, ur: NEGATIVE mg/dL
Leukocytes,Ua: NEGATIVE
Nitrite: NEGATIVE
Protein, ur: NEGATIVE mg/dL
Specific Gravity, Urine: 1.012 (ref 1.005–1.030)
pH: 5 (ref 5.0–8.0)

## 2023-10-02 LAB — CBC
HCT: 46.1 % (ref 39.0–52.0)
Hemoglobin: 15.6 g/dL (ref 13.0–17.0)
MCH: 30.6 pg (ref 26.0–34.0)
MCHC: 33.8 g/dL (ref 30.0–36.0)
MCV: 90.4 fL (ref 80.0–100.0)
Platelets: 256 K/uL (ref 150–400)
RBC: 5.1 MIL/uL (ref 4.22–5.81)
RDW: 12.4 % (ref 11.5–15.5)
WBC: 9.1 K/uL (ref 4.0–10.5)
nRBC: 0 % (ref 0.0–0.2)

## 2023-10-02 NOTE — ED Triage Notes (Signed)
 Patients family stated he has had more confusion since last night. Patient has dementia. Family stated the words are not making sense. Family wants to rule out UTI. Patient reports blood in stool. At urgent care his BP was 111 systolic and heart rate was in the upper 50s. Denies falls.

## 2023-10-03 ENCOUNTER — Telehealth: Payer: Self-pay | Admitting: Physician Assistant

## 2023-10-03 NOTE — Telephone Encounter (Signed)
 Pt.s Daughter calling as he is getting worse no sleep, fidgetting and wanting to drive, would like an appt with someone before Dina comes back and or Rx for help, pls call back will be here tomorrow for [employer] appt

## 2023-10-03 NOTE — ED Notes (Addendum)
 Pt and his daughter left because the wait was to long stated that they just want to see the urinalysis and blood work results in his mychart.

## 2023-10-04 ENCOUNTER — Encounter: Payer: Self-pay | Admitting: Psychology

## 2023-10-04 ENCOUNTER — Ambulatory Visit: Payer: Medicare Other | Admitting: Psychology

## 2023-10-04 ENCOUNTER — Ambulatory Visit: Payer: Self-pay | Admitting: Psychology

## 2023-10-04 DIAGNOSIS — F0280 Dementia in other diseases classified elsewhere without behavioral disturbance: Secondary | ICD-10-CM

## 2023-10-04 DIAGNOSIS — R4189 Other symptoms and signs involving cognitive functions and awareness: Secondary | ICD-10-CM

## 2023-10-04 DIAGNOSIS — G309 Alzheimer's disease, unspecified: Secondary | ICD-10-CM | POA: Diagnosis not present

## 2023-10-04 HISTORY — DX: Dementia in other diseases classified elsewhere, unspecified severity, without behavioral disturbance, psychotic disturbance, mood disturbance, and anxiety: F02.80

## 2023-10-04 NOTE — Progress Notes (Signed)
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 story learning task, and 0% across a figure drawing task. Performance across recognition tasks was exceptionally low, suggesting negligible evidence for information consolidation.   Results of emotional screening instruments suggested that recent symptoms of generalized anxiety were in the minimal range, while symptoms of depression were within the moderate range. A screening instrument assessing recent sleep quality suggested the presence of minimal sleep dysfunction.  Table of Scores:   Note: This summary of test scores accompanies the interpretive report and should not be considered in isolation without reference to the appropriate sections in the text. Descriptors are based on appropriate normative data and may be adjusted based on clinical judgment. Terms such as Within Normal Limits and Outside Normal Limits are used when a more specific description of the test score cannot be determined. Descriptors refer to the current evaluation only.         Percentile - Normative Descriptor > 98 -  Exceptionally High 91-97 - Well Above Average 75-90 - Above Average 25-74 - Average 9-24 - Below Average 2-8 - Well Below Average < 2 - Exceptionally Low         Validity: August 2022 July 2024 Current  DESCRIPTOR         DCT: --- --- --- --- Within Normal Limits         Orientation:        Raw Score Raw Score Raw Score Percentile   NAB Orientation, Form 1 --- 23/29 17/29 --- ---         Cognitive Screening:        Raw Score Raw Score Raw Score Percentile   SLUMS: --- 12/30 6/30 --- ---         RBANS, Form A: Standard Score/ Scaled Score Standard Score/ Scaled Score Standard Score/ Scaled Score Percentile   Total Score --- --- 48 <1 Exceptionally Low  Immediate Memory --- --- 40 <1 Exceptionally Low    List Learning --- --- 1 <1 Exceptionally Low    Story Memory --- --- 1 <1 Exceptionally Low  Visuospatial/Constructional --- --- 62 1 Exceptionally Low    Figure Copy --- --- 3 1 Exceptionally Low    Line Orientation --- --- 9/20 <  2 Exceptionally Low  Language --- --- 40 <1 Exceptionally Low    Picture Naming --- --- 4/10 <2 Exceptionally Low    Semantic Fluency --- --- 1 <1 Exceptionally Low  Attention --- --- 85 16 Below Average    Digit Span --- --- 11 63 Average    Coding --- --- 4 2 Well Below Average  Delayed Memory --- --- 40 <1 Exceptionally Low    List Recall --- --- 0/10 <2 Exceptionally Low    List Recognition --- --- 11/20 <2 Exceptionally Low    Story Recall --- --- 1 <1 Exceptionally Low    Story Recognition --- --- 5/12 1-2 Exceptionally Low    Figure Recall --- --- 1 <1 Exceptionally Low    Figure Recognition --- --- 1/8 1 Exceptionally Low          Intellectual Functioning:        Standard Score Standard Score Standard Score Percentile   Test of Premorbid Functioning: --- 96 98 45 Average         Memory:       NAB Memory Module, Form 1: Standard Score/ T Score Standard Score/ T Score Standard Score/ T Score Percentile   Total Memory Index 69 58 ---  --- ---  List Learning         Total Trials 1-3 16/36 (32) 8/36 (19) --- --- ---    List B 4/12 (47) 1/12 (26) --- --- ---    Short Delay Free Recall 2/12 (23) 0/12 (19) --- --- ---    Long Delay Free Recall 0/12 (21) 0/12 (23) --- --- ---    Retention Percentage 0 0 (22) --- --- ---    Recognition Discriminability 2 -3 (21) --- --- ---  Shape Learning         Total Trials 1-3 20/27 (61) 14/27 (46) --- --- ---    Delayed Recall 8/9 (67) 5/9 (46) --- --- ---    Retention Percentage 133 83 (45) --- --- ---    Recognition Discriminability 5 6 (47) --- --- ---  Story Learning         Immediate Recall 38/80 (31) 16/80 (19) --- --- ---    Delayed Recall 14/40 (31) 0/40 (28) --- --- ---    Retention Percentage 61 0 (<19) --- --- ---  Daily Living Memory         Immediate Recall 25/51 (25) 21/51 (21) --- --- ---    Delayed Recall 0/17 (19) 0/17 (19) --- --- ---    Retention Percentage 0 0 (<19) --- --- ---    Recognition Hits 7/10 5/10 (20) --- --- ---         Attention/Executive Function:       Trail Making Test (TMT): T Score Raw Score (T Score) Raw Score (T Score) Percentile     Part A 58 45 secs.,  0 errors (37) 130 secs.,  2 errors (<19) <1 Exceptionally Low    Part B 50 114 secs.,  0 errors (40) Discontinued --- Impaired           Scaled Score Scaled Score Scaled Score Percentile   WAIS-IV Coding: --- 9 2 <1 Exceptionally Low         NAB Attention Module, Form 1: T Score T Score T Score Percentile     Digits Forward 49 44 40 16 Below Average    Digits Backwards 45 40 33 5 Well Below Average  Scaled Score Scaled Score Scaled Score Percentile   WAIS-IV Similarities: --- --- 6 9 Below Average         D-KEFS Verbal Fluency Test: Raw Score (Scaled Score) Raw Score (Scaled Score) Raw Score (Scaled Score) Percentile     Letter Total Correct --- 31 (9) 4 (1) <1 Exceptionally Low    Category Total Correct --- 24 (6) 9 (1) <1 Exceptionally Low    Category Switching Total  Correct --- 5 (2) 2 (1) <1 Exceptionally Low    Category Switching Accuracy --- 2 (1) 0 (1) <1 Exceptionally Low      Total Set Loss Errors --- 9 (1) 25 (1) <1 Exceptionally Low      Total Repetition Errors --- 9 (4) 4 (9) 37 Average         Language:       Verbal Fluency Test: T Score Raw Score (T Score) Raw Score (T Score) Percentile     Phonemic Fluency (FAS) 39 31 (39) 4 (<19) <1 Exceptionally Low    Animal Fluency 31 7 (<19) 5 (<19) <1 Exceptionally Low          NAB Language Module, Form 1: T Score T Score T Score Percentile     Auditory Comprehension --- 56 19 <1 Exceptionally Low    Naming 31/31 (55) 25/31 (24) 15/31 (19) <1 Exceptionally Low         Visuospatial/Visuoconstruction:        Raw Score Raw Score Raw Score Percentile   Clock Drawing: 10/10 8/10 5/10 --- Impaired         NAB Spatial Module, Form 1: T Score T Score T Score Percentile     Figure Drawing Copy 60 53 --- --- ---          Scaled Score Scaled Score Scaled Score Percentile   WAIS-IV Block Design: --- 8 6 9  Below Average         Mood and Personality:        Raw Score Raw Score Raw Score Percentile   Beck Depression Inventory - II: 19 10 21  --- Moderate  PROMIS Anxiety Questionnaire: --- 22 13 --- None to Slight         Additional Questionnaires:        Raw Score Raw Score Raw Score Percentile   PROMIS Sleep Disturbance Questionnaire: --- 21 20 --- None to Slight   Informed Consent and Coding/Compliance:   The current evaluation represents a clinical evaluation for the purposes previously outlined by the referral source and is in no way reflective of a forensic evaluation.   Mr. Weinand was provided with a verbal description of the nature and purpose of the present neuropsychological evaluation. Also reviewed were the foreseeable risks and/or discomforts and benefits of the procedure, limits of confidentiality, and mandatory reporting requirements of this provider. The patient was given the opportunity  to ask questions and receive answers about the evaluation. Oral consent to participate was provided by the patient.   This evaluation was conducted by Arthea KYM Maryland, Ph.D., ABPP-CN, board certified clinical neuropsychologist. Mr. Cubit completed a clinical interview with Dr. Maryland, billed as one unit [phone], and 125 minutes of cognitive testing and scoring, billed as one unit [phone] and three additional units [id number]. Psychometrist Lonell Jude, B.S. assisted Dr. Maryland with test administration and scoring procedures. As a separate and discrete service, one unit [phone] and two units 96133 (160 minutes) were billed for Dr. Loralee time spent in interpretation and  report writing.

## 2023-10-04 NOTE — Telephone Encounter (Signed)
 Samples given of rexulti 0.5mg  14 day 1mg  and 2mg  7 day sample pack

## 2023-10-04 NOTE — Progress Notes (Signed)
   Psychometrician Note   Cognitive testing was administered to Jonathan Powers by Lonell Jude, B.S. (psychometrist) under the supervision of Dr. Arthea KYM Maryland, Ph.D., ABPP, licensed psychologist on 10/04/2023. Mr. Glazebrook did not appear overtly distressed by the testing session per behavioral observation or responses across self-report questionnaires. Rest breaks were offered.    The battery of tests administered was selected by Dr. Zachary C. Merz, Ph.D., ABPP with consideration to Mr. Hillyard's current level of functioning, the nature of his symptoms, emotional and behavioral responses during interview, level of literacy, observed level of motivation/effort, and the nature of the referral question. This battery was communicated to the psychometrist. Communication between Dr. Arthea KYM Maryland, Ph.D., ABPP and the psychometrist was ongoing throughout the evaluation and Dr. Arthea KYM Maryland, Ph.D., ABPP was immediately accessible at all times. Dr. Zachary C. Merz, Ph.D., ABPP provided supervision to the psychometrist on the date of this service to the extent necessary to assure the quality of all services provided.    [name] will return within approximately 1-2 weeks for an interactive feedback session with Dr. Maryland at which time his test performances, clinical impressions, and treatment recommendations will be reviewed in detail. Mr. Knack understands he can contact our office should he require our assistance before this time.  A total of 125 minutes of billable time were spent face-to-face with Mr. Hagg by the psychometrist. This includes both test administration and scoring time. Billing for these services is reflected in the clinical report generated by Dr. Arthea KYM Maryland, Ph.D., ABPP  This note reflects time spent with the psychometrician and does not include test scores or any clinical interpretations made by Dr. Maryland. The full report will follow in a separate note.

## 2023-10-04 NOTE — Telephone Encounter (Signed)
 I would suggest starting Rexulti to help with agitation associated with dementia. We can give samples when they come in today. Side effects may be drowsiness, dizziness. Start with the starter pack 0.5mg  every night for 1 week, then 1mg  every night for 1 week, then 2mg  every night. Let us  know how he is doing once on 2mg  dose so we can send refills if helpful. Thanks

## 2023-10-05 NOTE — Progress Notes (Signed)
   Neuropsychology Note [place]. [facility] Department of Neurology     Prior the clinical interview preceding Mr. Bornhorst's neuropsychological evaluation on 10/04/2023, his daughter provided a DMV Request for Driver Re-Examination form which she requested that I complete. Due to significant impairment exhibited across his evaluation and the nature of progressive decline, this form was filled out at her request and faxed to the [facility] on 10/05/2023.

## 2023-10-08 ENCOUNTER — Ambulatory Visit (INDEPENDENT_AMBULATORY_CARE_PROVIDER_SITE_OTHER): Admitting: Family Medicine

## 2023-10-08 ENCOUNTER — Encounter: Payer: Self-pay | Admitting: Family Medicine

## 2023-10-08 ENCOUNTER — Ambulatory Visit: Payer: Self-pay

## 2023-10-08 VITALS — BP 142/80 | HR 105 | Temp 98.7°F | Ht 70.0 in | Wt 232.8 lb

## 2023-10-08 DIAGNOSIS — G309 Alzheimer's disease, unspecified: Secondary | ICD-10-CM | POA: Diagnosis not present

## 2023-10-08 DIAGNOSIS — I10 Essential (primary) hypertension: Secondary | ICD-10-CM

## 2023-10-08 DIAGNOSIS — F0280 Dementia in other diseases classified elsewhere without behavioral disturbance: Secondary | ICD-10-CM
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
## 2023-10-08 NOTE — Patient Instructions (Addendum)
 I have sent in a referral for therapy today.   Someone should be reaching out to get you scheduled.   Follow up with neurology as scheduled.   If you are concerned for safety, or there is an acute change in mental status, the safest thing to do would be to head to the emergency room for further evaluation.   Follow-up with me for new or worsening symptoms.

## 2023-10-11 ENCOUNTER — Ambulatory Visit: Payer: Medicare Other | Admitting: Psychology

## 2023-10-11 DIAGNOSIS — F0280 Dementia in other diseases classified elsewhere without behavioral disturbance: Secondary | ICD-10-CM | POA: Diagnosis not present

## 2023-10-11 DIAGNOSIS — G309 Alzheimer's disease, unspecified: Secondary | ICD-10-CM

## 2023-10-11 NOTE — Progress Notes (Signed)
   Neuropsychology Feedback Session [name]. [facility] [place] Department of Neurology  Reason for Referral:   Jonathan Powers is a 73 y.o. right-handed Caucasian male referred by Camie Sevin, PA-C, to characterize his current cognitive functioning and assist with diagnostic clarity and treatment planning in the context of a previously diagnosed mild neurocognitive disorder, concern for underlying Alzheimer's disease, and concern for progressive cognitive decline.   Feedback:   Jonathan Powers completed a comprehensive neuropsychological evaluation on 10/04/2023. Please refer to that encounter for the full report and recommendations. Briefly, results suggested diffuse impairment. Performances were appropriate across basic attention and an abstract reasoning task. However, severe impairments were exhibited across all other assessed cognitive domains. This includes processing speed, complex attention, cognitive flexibility, receptive and expressive language, visuospatial abilities, and all aspects of learning and memory. Relative to previous evaluations in August 2022, quite significant decline has been exhibited across nearly all assessed cognitive domains. Compared to his most recent July 2024 evaluation, significant decline is also exhibited, especially across processing speed, all language (receptive and expressive), visuoconstructional abilities, and visual learning and memory. Given the degree of decline relative to initial testing in 2022, progression does seem to be occurring at a reasonably advanced rate. Regarding the cause for his dementia presentation, concerns surrounding an early-onset Alzheimer's disease presentation remain. Across memory testing, Jonathan Powers was fully amnestic (i.e., 0% retention) after a brief delay across all memory tasks and performed poorly across yes/no recognition trials. This continues to suggest evidence for rapid forgetting and a prominent storage  impairment, both of which are the hallmark testing patterns associated with this illness. Evidence for progressive memory decline over time, as well as decline across expressive language and other cognitive domains further elevates concern for the gradual progression of this illness. This condition remains most likely based upon testing performances.    Jonathan Powers was accompanied by his son and daughter during the current feedback session. Content of the current session focused on the results of his neuropsychological evaluation. Jonathan Powers was given the opportunity to ask questions and his questions were answered. He was encouraged to reach out should additional questions arise. A copy of his report was provided at the conclusion of the visit.      One unit 96132 (37 minutes) was billed for Dr. Loralee time spent preparing for, conducting, and documenting the current feedback session with Jonathan Powers.

## 2023-10-17 ENCOUNTER — Other Ambulatory Visit: Payer: Self-pay | Admitting: Neurology

## 2023-10-19 ENCOUNTER — Telehealth: Payer: Self-pay | Admitting: Internal Medicine

## 2023-10-19 NOTE — Telephone Encounter (Signed)
 Patients daughter left form for Dr. Joshua to fill out.  Form was placed in Dr. Sheryll box up front.  Please call when form is ready.

## 2023-10-23 ENCOUNTER — Other Ambulatory Visit: Payer: Self-pay

## 2023-10-23 ENCOUNTER — Encounter (HOSPITAL_BASED_OUTPATIENT_CLINIC_OR_DEPARTMENT_OTHER): Payer: Self-pay

## 2023-10-23 DIAGNOSIS — F0390 Unspecified dementia without behavioral disturbance: Secondary | ICD-10-CM | POA: Diagnosis not present

## 2023-10-23 DIAGNOSIS — R1031 Right lower quadrant pain: Secondary | ICD-10-CM | POA: Insufficient documentation

## 2023-10-23 DIAGNOSIS — I10 Essential (primary) hypertension: Secondary | ICD-10-CM | POA: Diagnosis not present

## 2023-10-23 DIAGNOSIS — R1032 Left lower quadrant pain: Secondary | ICD-10-CM | POA: Diagnosis not present

## 2023-10-23 DIAGNOSIS — M545 Low back pain, unspecified: Secondary | ICD-10-CM | POA: Diagnosis not present

## 2023-10-23 DIAGNOSIS — G8929 Other chronic pain: Secondary | ICD-10-CM | POA: Diagnosis not present

## 2023-10-23 LAB — CBC
HCT: 50.3 % (ref 39.0–52.0)
Hemoglobin: 17.2 g/dL — ABNORMAL HIGH (ref 13.0–17.0)
MCH: 30.2 pg (ref 26.0–34.0)
MCHC: 34.2 g/dL (ref 30.0–36.0)
MCV: 88.4 fL (ref 80.0–100.0)
Platelets: 241 K/uL (ref 150–400)
RBC: 5.69 MIL/uL (ref 4.22–5.81)
RDW: 12.5 % (ref 11.5–15.5)
WBC: 7.3 K/uL (ref 4.0–10.5)
nRBC: 0 % (ref 0.0–0.2)

## 2023-10-23 LAB — COMPREHENSIVE METABOLIC PANEL WITH GFR
ALT: 18 U/L (ref 0–44)
AST: 27 U/L (ref 15–41)
Albumin: 4.8 g/dL (ref 3.5–5.0)
Alkaline Phosphatase: 71 U/L (ref 38–126)
Anion gap: 14 (ref 5–15)
BUN: 19 mg/dL (ref 8–23)
CO2: 25 mmol/L (ref 22–32)
Calcium: 10.1 mg/dL (ref 8.9–10.3)
Chloride: 102 mmol/L (ref 98–111)
Creatinine, Ser: 1.04 mg/dL (ref 0.61–1.24)
GFR, Estimated: >60 mL/min (ref >60)
Glucose, Bld: 104 mg/dL — ABNORMAL HIGH (ref 70–99)
Potassium: 4.1 mmol/L (ref 3.5–5.1)
Sodium: 141 mmol/L (ref 135–145)
Total Bilirubin: 0.9 mg/dL (ref 0.0–1.2)
Total Protein: 7.7 g/dL (ref 6.5–8.1)

## 2023-10-23 LAB — LIPASE, BLOOD: Lipase: 37 U/L (ref 11–51)

## 2023-10-23 MED ORDER — BREXPIPRAZOLE 2 MG PO TABS: ORAL_TABLET | ORAL | 5 refills | Status: AC

## 2023-10-23 NOTE — Addendum Note (Signed)
 Addended by: GEORJEAN DARICE HERO on: 10/23/2023 02:34 PM   Modules accepted: Orders

## 2023-10-23 NOTE — ED Triage Notes (Signed)
 Patient went to urgent care for RLQ abdominal pain that started this morning, advised to present to ER for CT scan as the xray did not show anything.

## 2023-10-24 ENCOUNTER — Emergency Department (HOSPITAL_BASED_OUTPATIENT_CLINIC_OR_DEPARTMENT_OTHER)

## 2023-10-24 ENCOUNTER — Emergency Department (HOSPITAL_BASED_OUTPATIENT_CLINIC_OR_DEPARTMENT_OTHER)
Admission: EM | Admit: 2023-10-24 | Discharge: 2023-10-24 | Disposition: A | Discharge status: Home / Self Care | Attending: Emergency Medicine | Admitting: Emergency Medicine

## 2023-10-24 DIAGNOSIS — R1031 Right lower quadrant pain: Secondary | ICD-10-CM

## 2023-10-24 MED ORDER — IOHEXOL 300 MG/ML  SOLN
100.0000 mL | Freq: Once | INTRAMUSCULAR | Status: AC | PRN
  Administered 2023-10-24: 100 mL via INTRAVENOUS

## 2023-10-24 NOTE — ED Provider Notes (Signed)
 [place] EMERGENCY DEPARTMENT AT [facility]  Provider Note  CSN: [id number] Arrival date & time: 10/23/23 2309  History Chief Complaint  Patient presents with   Abdominal Pain    Jonathan Powers is a 73 y.o. male with history of dementia, HTN, pancreas insufficiency brought to the ED by daughter for evaluation of abdominal pain. He has had similar before although today his pain seems to be more right sided as opposed to his previous left sided pain. He has had his gall bladder removed in the past. HE has not had vomiting, fever, diarrhea or constipation. No dysuria. He was initially seen at [facility] where labs including UA were negative (not visible to me, but daughter shows me results on MyChart). He was advised to come to the ED for CT given location of his pain.    Home Medications Prior to Admission medications   Medication Sig Start Date End Date Taking? Authorizing Provider  acetaminophen  (TYLENOL ) 325 MG tablet Take 2 tablets (650 mg total) by mouth every 6 (six) hours as needed for mild pain (pain score 1-3), fever, headache or moderate pain (pain score 4-6). 05/31/23   Danton Reyes DASEN, MD  brexpiprazole  (REXULTI ) 2 MG TABS tablet Take 1 tablet every evening 10/23/23   Georjean Darice HERO, MD  cetirizine (ZYRTEC) 10 MG tablet Take 10 mg by mouth daily as needed for allergies.    [provider]  levothyroxine  (SYNTHROID ) 175 MCG tablet TAKE 1 TABLET BY MOUTH ONCE DAILY BEFORE BREAKFAST 09/17/23   Joshua Debby CROME, MD  memantine  (NAMENDA ) 10 MG tablet Take 1 tablet (10 mg total) by mouth 2 (two) times daily. 04/25/23   Wertman, Sara E, PA-C  OVER THE COUNTER MEDICATION Take by mouth daily. Plasmologens.    [provider]  rosuvastatin  (CRESTOR ) 5 MG tablet Take 1 tablet by mouth once daily 07/25/23   Joshua Debby CROME, MD  Testosterone  1.62 % GEL Apply 2 Pump topically in the morning. 11/26/20   [provider]     Allergies    Nsaids and  Tolmetin   Review of Systems   Review of Systems Please see HPI for pertinent positives and negatives  Physical Exam BP 135/88   Pulse (!) 55   Temp 98 F (36.7 C)   Resp 17   Ht 5' 11 (1.803 m)   Wt 100.2 kg   SpO2 97%   BMI 30.82 kg/m   Physical Exam Vitals and nursing note reviewed.  Constitutional:      Appearance: Normal appearance.  HENT:     Head: Normocephalic and atraumatic.     Nose: Nose normal.     Mouth/Throat:     Mouth: Mucous membranes are moist.  Eyes:     Extraocular Movements: Extraocular movements intact.     Conjunctiva/sclera: Conjunctivae normal.  Cardiovascular:     Rate and Rhythm: Normal rate.  Pulmonary:     Effort: Pulmonary effort is normal.     Breath sounds: Normal breath sounds.  Abdominal:     Palpations: Abdomen is soft.     Tenderness: There is abdominal tenderness in the right upper quadrant. There is no guarding. Negative signs include Murphy's sign and McBurney's sign.  Musculoskeletal:        General: No swelling. Normal range of motion.     Cervical back: Neck supple.  Skin:    General: Skin is warm and dry.  Neurological:     General: No focal deficit present.  Mental Status: He is alert.  Psychiatric:        Mood and Affect: Mood normal.     ED Results / Procedures / Treatments   EKG None  Procedures Procedures  Medications Ordered in the ED Medications  iohexol  (OMNIPAQUE ) 300 MG/ML solution 100 mL (100 mLs Intravenous Contrast Given 10/24/23 0142)    Initial Impression and Plan  Patient here with abdominal pain, mostly right sided, was RLQ at [facility] and more RUQ here. No peritoneal signs. Labs done here show unremarkable CBC, CMP and Lipase. I personally viewed the images from radiology studies and agree with radiologist interpretation: CT is neg for acute process. Patient reassured with negative workup. Recommend continued outpatient management with PCP and GI. RTED for any worsening pain, vomiting, fever,  bleeding or other concerns.   ED Course       MDM Rules/Calculators/A&P Medical Decision Making Problems Addressed: Right lower quadrant abdominal pain: acute illness or injury  Amount and/or Complexity of Data Reviewed Labs: ordered. Decision-making details documented in ED Course. Radiology: ordered and independent interpretation performed. Decision-making details documented in ED Course.  Risk Prescription drug management.     Final Clinical Impression(s) / ED Diagnoses Final diagnoses:  Right lower quadrant abdominal pain    Rx / DC Orders ED Discharge Orders     None        Roselyn Carlin NOVAK, MD 10/24/23 [phone]

## 2023-10-25 NOTE — Telephone Encounter (Signed)
 Form has been received. Patients daughter has been made aware that Dr. Joshua is out on vacation and will fill the form out when he's back in the office.

## 2023-10-26 ENCOUNTER — Ambulatory Visit: Admitting: Physician Assistant

## 2023-10-29 ENCOUNTER — Other Ambulatory Visit: Payer: Self-pay | Admitting: Internal Medicine

## 2023-10-29 DIAGNOSIS — H2513 Age-related nuclear cataract, bilateral: Secondary | ICD-10-CM | POA: Diagnosis not present

## 2023-10-29 DIAGNOSIS — H40013 Open angle with borderline findings, low risk, bilateral: Secondary | ICD-10-CM | POA: Diagnosis not present

## 2023-10-29 DIAGNOSIS — H35033 Hypertensive retinopathy, bilateral: Secondary | ICD-10-CM | POA: Diagnosis not present

## 2023-10-29 DIAGNOSIS — H43393 Other vitreous opacities, bilateral: Secondary | ICD-10-CM | POA: Diagnosis not present

## 2023-10-29 NOTE — Progress Notes (Unsigned)
 10/30/2023 [name] [id number] 08-22-1950  Referring provider: Joshua Debby CROME, MD Primary GI doctor: Dr. Charlanne (Dr. Aneita)  ASSESSMENT AND PLAN:  Abdominal pain with history of IBS-D 02/2022 Colonoscopy negative microscopic colitis Has had multiple CTs this year, March, May and September with no acute findings other than colonic stool burden Periumblical pain with palpation, and right AB pain with movements + Carnett on exam, try salon pas patches, will get Xray thoracic/lumbar for possible compression fracture/radicular pain, follow up PCP - will start miralax /fiber, consider linzess  Trouble swallowing with pills and food intermittent -Abnormal neck exam with submental lymphadenopathy, get CT neck, follow up PCP/dental/oral surgeron -Will get Barium swallow -Trial of pepcid  -Consider EGD  Personal history of adenomatous colon polyp 02/2022 colonoscopy 3 polyps 5 to 8 mm descending ascending colon, mild diverticulosis left colon.  3 TA's Recall colonoscopy January 2027  Hepatic steatosis Seen on CTAP W/07/2023    Latest Ref Rng & Units 10/23/2023   11:16 PM 10/02/2023    5:53 PM 06/04/2023    4:27 AM  Hepatic Function  Total Protein 6.5 - 8.1 g/dL 7.7  7.3  6.7   Albumin 3.5 - 5.0 g/dL 4.8  4.0  3.0   AST 15 - 41 U/L 27  18  22    ALT 0 - 44 U/L 18  16  19    Alk Phosphatase 38 - 126 U/L 71  52  54   Total Bilirubin 0.0 - 1.2 mg/dL 0.9  1.1  0.8    Platelets 241  INR 05/27/2023 1.3  - need LFTs and CBC monitored every 6 months, - evaluation with imaging every 2-3 years.  - Encouraged diet/exercise for modest 10% body weight loss as treatment for hepatic steatosis -Continue to work on risk factor modification including diet exercise and control of risk factors including blood sugars.  Dementia Daughter here and provides some of the history Get on a B complex Possible hyperfixation on AB pain Possible decreased movement/medications contributing to worsening  constipation Follow up PCP  Patient Care Team: Joshua Debby CROME, MD as PCP - General (Internal Medicine) Dolphus Reiter, MD (Rheumatology) Donnald Charleston, MD (Gastroenterology) Rolan Ezra RAMAN, MD (Cardiology) Nieves Cough, MD (Urology) Dina Camie FORBES RIGGERS (Neurology)  HISTORY OF PRESENT ILLNESS: 73 y.o. male with a past medical history listed below presents for evaluation of AB pain.   01/09/2023 last saw Dr. Aneita for left upper quadrant abdominal pain  Discussed the use of AI scribe software for clinical note transcription with the patient, who gave verbal consent to proceed.  History of Present Illness   Jonathan Powers is a 73 year old male with IBS and diverticulosis who presents with worsening abdominal pain.  He has been experiencing worsening abdominal pain, primarily peri-umbilical, which radiates and becomes more pronounced with physical activity such as picking things up. The pain is described as achy and tender to touch. This pain has been ongoing for several years, with an increase in severity since March. Multiple CT scans this year (March, May, and September) were normal except for showing stool burden.  His bowel movements are inconsistent, ranging from constipation with 'concrete-like' stools to occasional urgency with loose stools. The constipation can be severe enough to cause near syncope during defecation. He has a history of internal hemorrhoids and diverticulosis. A colonoscopy performed in January of the previous year was negative for microscopic colitis and inflammation.  He has difficulty swallowing, particularly with gel cap pills, which  started this year. Occasionally, he experiences a sensation of fullness and rare episodes of acid reflux. No nausea, vomiting, or significant upper GI symptoms.  He denies any tobacco or alcohol use and has not visited a dentist in a long time. He has a history of arthritis and has experienced episodes of black stool  in the past. No numbness or tingling in feet.        He  reports that he has never smoked. He has never been exposed to tobacco smoke. He has never used smokeless tobacco. He reports that he does not currently use alcohol. He reports that he does not use drugs.  RELEVANT GI HISTORY, IMAGING AND LABS: Results   RADIOLOGY Abdominal CT: Normal, stool burden present (04/2023) Abdominal CT: Normal, stool burden present (06/2023) Abdominal CT: Normal, stool burden present (10/2023) CT Cervical Spine: Surrounding soft tissue structures unremarkable (03/2023) Chest CT: Bilateral hip arthroplasties, no acute findings (06/2022) Rib X-ray: Chronic left anterior seventh rib fracture (04/2023)  DIAGNOSTIC Colonoscopy: Negative for microscopic colitis and inflammation (02/2022)     10/24/2023 CTAP W No acute findings in the abdomen or pelvis. 07/05/2023 CTAP W  moderate volume stool throughout the colon diverticulosis without diverticulitis segmental mild dilation distal left ureter measuring up to 7 mm may be due to peristalsis Colonoscopy Jan 2024 - Three 5 to 8 mm polyps in the descending colon, in the ascending colon and at the ileocecal valve, removed with a cold snare. Resected and retrieved.  - Mild diverticulosis in the left colon.   Path: 3 TAs CBC    Component Value Date/Time   WBC 7.3 10/23/2023 2316   RBC 5.69 10/23/2023 2316   HGB 17.2 (H) 10/23/2023 2316   HCT 50.3 10/23/2023 2316   PLT 241 10/23/2023 2316   MCV 88.4 10/23/2023 2316   MCH 30.2 10/23/2023 2316   MCHC 34.2 10/23/2023 2316   RDW 12.5 10/23/2023 2316   LYMPHSABS 1.6 06/01/2023 2006   MONOABS 0.7 06/01/2023 2006   EOSABS 0.3 06/01/2023 2006   BASOSABS 0.0 06/01/2023 2006   Recent Labs    05/27/23 0836 05/28/23 0646 05/29/23 0554 05/31/23 0618 06/01/23 2006 06/02/23 0435 06/03/23 0434 06/04/23 0427 10/02/23 1753 10/23/23 2316  HGB 14.8 15.7 14.3 13.7 14.4 13.3 14.5 13.9 15.6 17.2*    CMP      Component Value Date/Time   NA 141 10/23/2023 2316   NA 140 11/07/2018 1632   K 4.1 10/23/2023 2316   CL 102 10/23/2023 2316   CO2 25 10/23/2023 2316   GLUCOSE 104 (H) 10/23/2023 2316   BUN 19 10/23/2023 2316   BUN 13 11/07/2018 1632   CREATININE 1.04 10/23/2023 2316   CREATININE 0.86 11/28/2019 1357   CALCIUM  10.1 10/23/2023 2316   PROT 7.7 10/23/2023 2316   ALBUMIN 4.8 10/23/2023 2316   AST 27 10/23/2023 2316   ALT 18 10/23/2023 2316   ALKPHOS 71 10/23/2023 2316   BILITOT 0.9 10/23/2023 2316   GFRNONAA >60 10/23/2023 2316   GFRNONAA 89 11/28/2019 1357   GFRAA 103 11/28/2019 1357      Latest Ref Rng & Units 10/23/2023   11:16 PM 10/02/2023    5:53 PM 06/04/2023    4:27 AM  Hepatic Function  Total Protein 6.5 - 8.1 g/dL 7.7  7.3  6.7   Albumin 3.5 - 5.0 g/dL 4.8  4.0  3.0   AST 15 - 41 U/L 27  18  22    ALT 0 - 44 U/L 18  16  19   Alk Phosphatase 38 - 126 U/L 71  52  54   Total Bilirubin 0.0 - 1.2 mg/dL 0.9  1.1  0.8       Latest Ref Rng & Units 09/26/2016    3:29 PM  Hepatitis C  HCV Quanitative NOT DETECTED IU/mL <15 NOT DETECTED  C  HCV Quanitative Log NOT DETECTED Log IU/mL <1.18 NOT DETECTED  C    C Corrected result    Current Medications:   Current Outpatient Medications (Endocrine & Metabolic):    levothyroxine  (SYNTHROID ) 175 MCG tablet, TAKE 1 TABLET BY MOUTH ONCE DAILY BEFORE BREAKFAST   Testosterone  1.62 % GEL, Apply 2 Pump topically in the morning.  Current Outpatient Medications (Cardiovascular):    rosuvastatin  (CRESTOR ) 5 MG tablet, Take 1 tablet by mouth once daily  Current Outpatient Medications (Respiratory):    cetirizine (ZYRTEC) 10 MG tablet, Take 10 mg by mouth daily as needed for allergies.   fluticasone  (FLONASE ) 50 MCG/ACT nasal spray, Place 1 spray into both nostrils daily.  Current Outpatient Medications (Analgesics):    acetaminophen  (TYLENOL ) 325 MG tablet, Take 2 tablets (650 mg total) by mouth every 6 (six) hours as needed for mild  pain (pain score 1-3), fever, headache or moderate pain (pain score 4-6).   Current Outpatient Medications (Other):    brexpiprazole  (REXULTI ) 2 MG TABS tablet, Take 1 tablet every evening   memantine  (NAMENDA ) 10 MG tablet, Take 1 tablet (10 mg total) by mouth 2 (two) times daily.   OVER THE COUNTER MEDICATION, Take by mouth daily. Plasmologens.   traZODone  (DESYREL ) 100 MG tablet, Take 100 mg by mouth as needed for sleep.  Medical History:  Past Medical History:  Diagnosis Date   Acute recurrent pansinusitis 02/17/2021   Age related osteoporosis 03/11/2018   Allergic rhinitis 05/17/2006   BPH (benign prostatic hyperplasia) 09/06/2012   Cellulitis of left lower extremity 11/10/2018   Cellulitis, right leg    Recurrent R hip cellulitis 1/08,3/08    Chronic fatigue, unspecified    Chronic hyperglycemia 06/29/2020   Chronic right-sided low back pain without sciatica 07/21/2016   Chronic sinusitis 07/10/2021   Dementia due to Alzheimer's disease 10/04/2023   Deviated septum 02/17/2021   Diverticulitis    Essential hypertension 05/17/2006   2014     Impression  Exercise Capacity:  Lexiscan  with low level exercise.  BP Response:  Normal blood pressure response.  Clinical Symptoms:  No significant symptoms noted.  ECG Impression:  No significant ST segment change suggestive of ischemia.  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   Flexural eczema 07/24/2017   Generalized abdominal tenderness with rebound tenderness 08/02/2022   GERD (gastroesophageal reflux disease)    Gout    Hashimoto's thyroiditis    History of skin cancer    Melanoma on back   Hypogonadism male    low T, a/w ED   Hypothyroidism 11/11/2018   Idiopathic urticaria    Insomnia 11/10/2018   Left flank pain 08/02/2022    Left thyroid  nodule 2008   on US , decrease size in 2011 US    Lung nodule 07/25/2021   Migraines    Atypical and ocular   Nasal turbinate hypertrophy 02/17/2021   Obesity (BMI 30-39.9) 05/17/2006   OSA (obstructive sleep apnea)  01/27/2013   HST 01/2013:  AHI 68/hr with obstructive and central events.   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 No acute findings.  2. 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  3. Ectatic abdominal aorta at risk for aneurysm development.  Recommend followup by ultrasound in 5 years. This recommendation  follows ACR consensus guidelines: White Paper of the ACR Incidental  Findings Committee II on Vascular Findings. J Am Coll Radiol 2013;  10:789-794.  4. 1.   Symptomatic PVCs 09/06/2012   Allergies:  Allergies  Allergen Reactions   Nsaids Shortness Of Breath    Naproxen specifically causes SOB/wheeze tolerates topical diclofenac  without problem Tylenol  OK   Tolmetin Shortness Of Breath    Naproxen specifically causes SOB/wheeze tolerates topical diclofenac  without problem Tylenol  OK     Surgical History:  He  has a past surgical history that includes Right shoulder cuff repair (Right, 09/15/11); Right shoulder SAD, DCR (Right, 02/05/11); Total hip arthroplasty (Left, 07/26/06); Total hip arthroplasty (Right, 1999); Cholecystectomy (2004); Repair digital thumb, left (Left, 1990); CTR (Bilateral, 1982); Cystoscopy (1974); open reduction L little finger (Left, 1970); Hemorrhoid surgery (N/A, 03/30/2017); Carpal tunnel release (Left, 03/30/2017); Foot Fusion (Left, 06/22/2017); and Total hip revision (Left, 03/07/2019). Family History:  His family history  includes Arthritis in his father and mother; Cancer in his paternal grandfather and paternal grandmother; Diabetes in his father and mother; Heart disease in his father; Multiple sclerosis in his mother; Thyroid  cancer in his sister; Uterine cancer in his sister; Vascular Disease in his father.  REVIEW OF SYSTEMS  : All other systems reviewed and negative except where noted in the History of Present Illness.  PHYSICAL EXAM: BP 128/82   Pulse (!) 58   Ht 5' 11 (1.803 m)   Wt 231 lb 4 oz (104.9 kg)   BMI 32.25 kg/m  Physical Exam   GENERAL APPEARANCE: Well nourished, in no apparent distress. HEENT: No cervical lymphadenopathy, unremarkable thyroid , sclerae anicteric, conjunctiva pink. RESPIRATORY: Respiratory effort normal, breath sounds clear to auscultation bilaterally without rales, rhonchi, or wheezing. CARDIO: Regular rate and rhythm with no murmurs, rubs, or gallops, peripheral pulses intact. ABDOMEN: Soft, non-distended, active bowel sounds in all four quadrants, tenderness to palpation with pinpoint tenderness and positive Carnett's sign, no rebound, no mass appreciated. RECTAL: Declines. MUSCULOSKELETAL: Full range of motion, normal gait, without edema. SKIN: Dry, intact without rashes or lesions. No jaundice. NEURO: Alert, oriented, no focal deficits. PSYCH: Cooperative, normal mood and affect. NECK: Tender lymphadenopathy in the neck, enlarged carotid area.      Alan JONELLE Coombs, PA-C 2:42 PM

## 2023-10-30 ENCOUNTER — Ambulatory Visit (INDEPENDENT_AMBULATORY_CARE_PROVIDER_SITE_OTHER): Admitting: Physician Assistant

## 2023-10-30 ENCOUNTER — Encounter: Payer: Self-pay | Admitting: Physician Assistant

## 2023-10-30 ENCOUNTER — Ambulatory Visit
Admission: RE | Admit: 2023-10-30 | Discharge: 2023-10-30 | Disposition: A | Discharge status: Home / Self Care | Source: Ambulatory Visit | Attending: Physician Assistant | Admitting: Physician Assistant

## 2023-10-30 VITALS — BP 128/82 | HR 58 | Ht 71.0 in | Wt 231.2 lb

## 2023-10-30 DIAGNOSIS — R109 Unspecified abdominal pain: Secondary | ICD-10-CM

## 2023-10-30 DIAGNOSIS — K580 Irritable bowel syndrome with diarrhea: Secondary | ICD-10-CM | POA: Diagnosis not present

## 2023-10-30 DIAGNOSIS — K5900 Constipation, unspecified: Secondary | ICD-10-CM

## 2023-10-30 DIAGNOSIS — R1310 Dysphagia, unspecified: Secondary | ICD-10-CM | POA: Diagnosis not present

## 2023-10-30 DIAGNOSIS — Z860101 Personal history of adenomatous and serrated colon polyps: Secondary | ICD-10-CM

## 2023-10-30 DIAGNOSIS — R1319 Other dysphagia: Secondary | ICD-10-CM

## 2023-10-30 DIAGNOSIS — R590 Localized enlarged lymph nodes: Secondary | ICD-10-CM

## 2023-10-30 DIAGNOSIS — K760 Fatty (change of) liver, not elsewhere classified: Secondary | ICD-10-CM

## 2023-10-30 DIAGNOSIS — K5904 Chronic idiopathic constipation: Secondary | ICD-10-CM

## 2023-10-30 NOTE — Patient Instructions (Signed)
 _______________________________________________________  If your blood pressure at your visit was 140/90 or greater, please contact your primary care physician to follow up on this.  _______________________________________________________  If you are age 72 or older, your body mass index should be between 23-30. Your Body mass index is 32.25 kg/m. If this is out of the aforementioned range listed, please consider follow up with your Primary Care Provider.  If you are age 51 or younger, your body mass index should be between 19-25. Your Body mass index is 32.25 kg/m. If this is out of the aformentioned range listed, please consider follow up with your Primary Care Provider.   ________________________________________________________  The [facility] providers would like to encourage you to use MYCHART to communicate with providers for non-urgent requests or questions.  Due to long hold times on the telephone, sending your provider a message by [facility] may be a faster and more efficient way to get a response.  Please allow 48 business hours for a response.  Please remember that this is for non-urgent requests.  _______________________________________________________  [facility] is using a team-based approach to care.  Your team is made up of your doctor and two to three APPS. Our APPS (Nurse Practitioners and Physician Assistants) work with your physician to ensure care continuity for you. They are fully qualified to address your health concerns and develop a treatment plan. They communicate directly with your gastroenterologist to care for you. Seeing the Advanced Practice Practitioners on your physician's team can help you by facilitating care more promptly, often allowing for earlier appointments, access to diagnostic testing, procedures, and other specialty referrals.   Your provider has requested that you have an abdominal x ray before leaving today. Please go to the basement floor  to our Radiology department for the test.  You have been scheduled for a CT scan Neck at [facility] [place], [place], [place] [address]).   You are scheduled on 11-07-23 at 2pm. You should arrive 30 minutes prior to your appointment time for registration. Please follow the written instructions below on the day of your exam:  WARNING: IF YOU ARE ALLERGIC TO IODINE /X-RAY DYE, PLEASE NOTIFY RADIOLOGY IMMEDIATELY AT [phone]! YOU WILL BE GIVEN A 13 HOUR PREMEDICATION PREP.  Depending on your individual set of symptoms, you may also receive an intravenous injection of x-ray contrast/dye. Plan on being at [facility] for 30 minutes or longer, depending on the type of exam you are having performed.  This test typically takes 30-45 minutes to complete.  If you have any questions regarding your exam or if you need to reschedule, you may call the CT department at [phone] between the hours of 8:00 am and 5:00 pm, Monday-Friday.  ________________________________________________________________________  Rosine have been scheduled for a Barium Esophogram at [facility] Radiology (1st floor of the hospital) on 11-14-23 at 11am. Please arrive 30 minutes prior to your appointment for registration. Make certain not to have anything to eat or drink 3 hours prior to your test. If you need to reschedule for any reason, please contact radiology at [phone] to do so. __________________________________________________________________ A barium swallow is an examination that concentrates on views of the esophagus. This tends to be a double contrast exam (barium and two liquids which, when combined, create a gas to distend the wall of the oesophagus) or single contrast (non-ionic iodine  based). The study is usually tailored to your symptoms so a good history is essential. Attention is paid during the study to the  form, structure and configuration of the esophagus, looking for functional  disorders (such as aspiration, dysphagia, achalasia, motility and reflux) EXAMINATION You may be asked to change into a gown, depending on the type of swallow being performed. A radiologist and radiographer will perform the procedure. The radiologist will advise you of the type of contrast selected for your procedure and direct you during the exam. You will be asked to stand, sit or lie in several different positions and to hold a small amount of fluid in your mouth before being asked to swallow while the imaging is performed .In some instances you may be asked to swallow barium coated marshmallows to assess the motility of a solid food bolus. The exam can be recorded as a digital or video fluoroscopy procedure. POST PROCEDURE It will take 1-2 days for the barium to pass through your system. To facilitate this, it is important, unless otherwise directed, to increase your fluids for the next 24-48hrs and to resume your normal diet.  This test typically takes about 30 minutes to perform. __________________________________________________________________________________   No aleve, ibuprofen, goody powders, as these are antiinflammatories and can cause inflammation in your stomach, increase bleeding risk and cause ulcers.  You can talk with PCP about alternative pain options.  Can do tyelnol max 3000 mg a day, salon pas patches are over the counter and voltern gel is topical antiinflammatory that is safe.   Miralax  is an osmotic laxative.  It only brings more water  into the stool.  This is safe to take daily.  Can take up to 17 gram of miralax  twice a day.  Mix with juice or coffee.  Start 1 capful at night for 3-4 days and reassess your response in 3-4 days.  You can increase and decrease the dose based on your response.  Remember, it can take up to 3-4 days to take effect OR for the effects to wear off.   I often pair this with benefiber in the morning to help assure the stool is not too  loose.   FIBER SUPPLEMENT You can do metamucil or fibercon once or twice a day but if this causes gas/bloating please switch to Benefiber or Citracel.  Fiber is good for constipation/diarrhea/irritable bowel syndrome.  It can also help with weight loss and can help lower your bad cholesterol (LDL).  Please do 1 TBSP in the morning in water , coffee, or tea.  It can take up to a month before you can see a difference with your bowel movements.  It is cheapest from [employer], [employer], [employer].   Silent reflux: Not all heartburn burns...SABRASABRASABRA  What is LPR? Laryngopharyngeal reflux (LPR) or silent reflux is a condition in which acid that is made in the stomach travels up the esophagus (swallowing tube) and gets to the throat. Not everyone with reflux has a lot of heartburn or indigestion. In fact, many people with LPR never have heartburn. This is why LPR is called SILENT REFLUX, and the terms Silent reflux and LPR are often used interchangeably. Because LPR is silent, it is sometimes difficult to diagnose.  How can you tell if you have LPR?  Chronic hoarseness- Some people have hoarseness that comes and goes throat clearing  Cough It can cause shortness of breath and cause asthma like symptoms. a feeling of a lump in the throat  difficulty swallowing a problem with too much nose and throat drainage.  Some people will feel their esophagus spasm which feels like their heart beating hard and  fast, this will usually be after a meal, at rest, or lying down at night.    How do I treat this? Treatment for LPR should be individualized, and your doctor will suggest the best treatment for you. Generally there are several treatments for LPR: changing habits and diet to reduce reflux,  medications to reduce stomach acid, and  surgery to prevent reflux. Most people with LPR need to modify how and when they eat, as well as take some medication, to get well. Sometimes, nonprescription liquid antacids, such  as Maalox, Gelucil and Mylanta are recommended. When used, these antacids should be taken four times each day - one tablespoon one hour after each meal and before bedtime. Dietary and lifestyle changes alone are not often enough to control LPR - medications that reduce stomach acid are also usually needed. These must be prescribed by our doctor.   TIPS FOR REDUCING REFLUX AND LPR Control your LIFE-STYLE and your DIET! If you use tobacco, QUIT.  Smoking makes you reflux. After every cigarette you have some LPR.  Don't wear clothing that is too tight, especially around the waist (trousers, corsets, belts).  Do not lie down just after eating...in fact, do not eat within three hours of bedtime.  You should be on a low-fat diet.  Limit your intake of red meat.  Limit your intake of butter.  Avoid fried foods.  Avoid chocolate  Avoid cheese.  Avoid eggs. Specifically avoid caffeine (especially coffee and tea), soda pop (especially cola) and mints.  Avoid alcoholic beverages, particularly in the evening.

## 2023-11-07 ENCOUNTER — Ambulatory Visit (HOSPITAL_COMMUNITY)
Admission: RE | Admit: 2023-11-07 | Discharge: 2023-11-07 | Disposition: A | Discharge status: Home / Self Care | Source: Ambulatory Visit | Attending: Physician Assistant | Admitting: Physician Assistant

## 2023-11-07 DIAGNOSIS — R590 Localized enlarged lymph nodes: Secondary | ICD-10-CM | POA: Diagnosis not present

## 2023-11-07 DIAGNOSIS — R1310 Dysphagia, unspecified: Secondary | ICD-10-CM | POA: Diagnosis not present

## 2023-11-07 MED ORDER — IOHEXOL 300 MG/ML  SOLN
100.0000 mL | Freq: Once | INTRAMUSCULAR | Status: AC | PRN
  Administered 2023-11-07: 100 mL via INTRAVENOUS

## 2023-11-08 ENCOUNTER — Ambulatory Visit: Payer: Self-pay | Admitting: Physician Assistant

## 2023-11-12 ENCOUNTER — Telehealth: Payer: Self-pay | Admitting: Internal Medicine

## 2023-11-12 NOTE — Telephone Encounter (Signed)
 PT called to check on status on paperwork and would like a call back from CMA please call @  [phone]

## 2023-11-14 ENCOUNTER — Other Ambulatory Visit: Payer: Self-pay | Admitting: Internal Medicine

## 2023-11-14 ENCOUNTER — Ambulatory Visit (HOSPITAL_COMMUNITY)
Admission: RE | Admit: 2023-11-14 | Discharge: 2023-11-14 | Disposition: A | Discharge status: Home / Self Care | Source: Ambulatory Visit | Attending: Physician Assistant | Admitting: Physician Assistant

## 2023-11-14 DIAGNOSIS — R1310 Dysphagia, unspecified: Secondary | ICD-10-CM | POA: Insufficient documentation

## 2023-11-14 DIAGNOSIS — E785 Hyperlipidemia, unspecified: Secondary | ICD-10-CM

## 2023-11-16 ENCOUNTER — Telehealth: Payer: Self-pay | Admitting: Radiology

## 2023-11-16 NOTE — Telephone Encounter (Signed)
 Spoke with the patient, she gave me a verbal yes that it's okay for Dr. Joshua to document that he doesn't want the patient driving.

## 2023-11-16 NOTE — Telephone Encounter (Signed)
 Copied from CRM #[id number]. Topic: General - Other >> Nov 16, 2023 12:34 PM Thersia BROCKS wrote: Reason for CRM: Patient daughter, Jonathan Powers called in stated she would like for Jonathan Powers to give her a callback regarding paperwork and the status of it . Would like a callback regarding this [phone]

## 2023-11-16 NOTE — Telephone Encounter (Signed)
 Form will be completed by Dr. Joshua on Monday when he returns.

## 2023-11-19 NOTE — Telephone Encounter (Signed)
 This has been handled. I have spoken with his daughter. Dr. Joshua is completing the paperwork and I will call his daughter when it's finished.

## 2023-11-29 ENCOUNTER — Encounter: Payer: Self-pay | Admitting: Physician Assistant

## 2023-11-29 ENCOUNTER — Ambulatory Visit: Admitting: Physician Assistant

## 2023-11-29 VITALS — BP 127/63 | HR 57 | Resp 20 | Ht 71.0 in | Wt 232.0 lb

## 2023-11-29 DIAGNOSIS — G309 Alzheimer's disease, unspecified: Secondary | ICD-10-CM | POA: Diagnosis not present

## 2023-11-29 DIAGNOSIS — F0280 Dementia in other diseases classified elsewhere without behavioral disturbance: Secondary | ICD-10-CM | POA: Diagnosis not present

## 2023-11-29 DIAGNOSIS — R413 Other amnesia: Secondary | ICD-10-CM | POA: Diagnosis not present

## 2023-11-29 NOTE — Patient Instructions (Addendum)
 It was a pleasure to see you today at our office.   Recommendations:  Follow up in 6 months Continue  memantine  to 10 mg at twice a day  Repeat MRI brain         RECOMMENDATIONS FOR ALL PATIENTS WITH MEMORY PROBLEMS: 1. Continue to exercise (Recommend 30 minutes of walking everyday, or 3 hours every week) 2. Increase social interactions - continue going to [place] and enjoy social gatherings with friends and family 3. Eat healthy, avoid fried foods and eat more fruits and vegetables 4. Maintain adequate blood pressure, blood sugar, and blood cholesterol level. Reducing the risk of stroke and cardiovascular disease also helps promoting better memory. 5. Avoid stressful situations. Live a simple life and avoid aggravations. Organize your time and prepare for the next day in anticipation. 6. Sleep well, avoid any interruptions of sleep and avoid any distractions in the bedroom that may interfere with adequate sleep quality 7. Avoid sugar, avoid sweets as there is a strong link between excessive sugar intake, diabetes, and cognitive impairment We discussed the Mediterranean diet, which has been shown to help patients reduce the risk of progressive memory disorders and reduces cardiovascular risk. This includes eating fish, eat fruits and green leafy vegetables, nuts like almonds and hazelnuts, walnuts, and also use olive oil. Avoid fast foods and fried foods as much as possible. Avoid sweets and sugar as sugar use has been linked to worsening of memory function.  There is always a concern of gradual progression of memory problems. If this is the case, then we may need to adjust level of care according to patient needs. Support, both to the patient and caregiver, should then be put into place.     FALL PRECAUTIONS: Be cautious when walking. Scan the area for obstacles that may increase the risk of trips and falls. When getting up in the mornings, sit up at the edge of the bed for a few minutes  before getting out of bed. Consider elevating the bed at the head end to avoid drop of blood pressure when getting up. Walk always in a well-lit room (use night lights in the walls). Avoid area rugs or power cords from appliances in the middle of the walkways. Use a walker or a cane if necessary and consider physical therapy for balance exercise. Get your eyesight checked regularly.  FINANCIAL OVERSIGHT: Supervision, especially oversight when making financial decisions or transactions is also recommended.  HOME SAFETY: Consider the safety of the kitchen when operating appliances like stoves, microwave oven, and blender. Consider having supervision and share cooking responsibilities until no longer able to participate in those. Accidents with firearms and other hazards in the house should be identified and addressed as well.   ABILITY TO BE LEFT ALONE: If patient is unable to contact 911 operator, consider using LifeLine, or when the need is there, arrange for someone to stay with patients. Smoking is a fire hazard, consider supervision or cessation. Risk of wandering should be assessed by caregiver and if detected at any point, supervision and safe proof recommendations should be instituted.  MEDICATION SUPERVISION: Inability to self-administer medication needs to be constantly addressed. Implement a mechanism to ensure safe administration of the medications.        Mediterranean Diet A Mediterranean diet refers to food and lifestyle choices that are based on the traditions of countries located on the [employer]. This way of eating has been shown to help prevent certain conditions and improve outcomes for people who  have chronic diseases, like kidney disease and heart disease. What are tips for following this plan? Lifestyle  Cook and eat meals together with your family, when possible. Drink enough fluid to keep your urine clear or pale yellow. Be physically active every day. This  includes: Aerobic exercise like running or swimming. Leisure activities like gardening, walking, or housework. Get 7-8 hours of sleep each night. If recommended by your health care provider, drink red wine in moderation. This means 1 glass a day for nonpregnant women and 2 glasses a day for men. A glass of wine equals 5 oz (150 mL). Reading food labels  Check the serving size of packaged foods. For foods such as rice and pasta, the serving size refers to the amount of cooked product, not dry. Check the total fat in packaged foods. Avoid foods that have saturated fat or trans fats. Check the ingredients list for added sugars, such as corn syrup. Shopping  At the grocery store, buy most of your food from the areas near the walls of the store. This includes: Fresh fruits and vegetables (produce). Grains, beans, nuts, and seeds. Some of these may be available in unpackaged forms or large amounts (in bulk). Fresh seafood. Poultry and eggs. Low-fat dairy products. Buy whole ingredients instead of prepackaged foods. Buy fresh fruits and vegetables in-season from local farmers markets. Buy frozen fruits and vegetables in resealable bags. If you do not have access to quality fresh seafood, buy precooked frozen shrimp or canned fish, such as tuna, salmon, or sardines. Buy small amounts of raw or cooked vegetables, salads, or olives from the deli or salad bar at your store. Stock your pantry so you always have certain foods on hand, such as olive oil, canned tuna, canned tomatoes, rice, pasta, and beans. Cooking  Cook foods with extra-virgin olive oil instead of using butter or other vegetable oils. Have meat as a side dish, and have vegetables or grains as your main dish. This means having meat in small portions or adding small amounts of meat to foods like pasta or stew. Use beans or vegetables instead of meat in common dishes like chili or lasagna. Experiment with different cooking methods. Try  roasting or broiling vegetables instead of steaming or sauteing them. Add frozen vegetables to soups, stews, pasta, or rice. Add nuts or seeds for added healthy fat at each meal. You can add these to yogurt, salads, or vegetable dishes. Marinate fish or vegetables using olive oil, lemon juice, garlic, and fresh herbs. Meal planning  Plan to eat 1 vegetarian meal one day each week. Try to work up to 2 vegetarian meals, if possible. Eat seafood 2 or more times a week. Have healthy snacks readily available, such as: Vegetable sticks with hummus. Greek yogurt. Fruit and nut trail mix. Eat balanced meals throughout the week. This includes: Fruit: 2-3 servings a day Vegetables: 4-5 servings a day Low-fat dairy: 2 servings a day Fish, poultry, or lean meat: 1 serving a day Beans and legumes: 2 or more servings a week Nuts and seeds: 1-2 servings a day Whole grains: 6-8 servings a day Extra-virgin olive oil: 3-4 servings a day Limit red meat and sweets to only a few servings a month What are my food choices? Mediterranean diet Recommended Grains: Whole-grain pasta. Brown rice. Bulgar wheat. Polenta. Couscous. Whole-wheat bread. [place] Madeira. Vegetables: Artichokes. Beets. Broccoli. Cabbage. Carrots. Eggplant. Green beans. Chard. Kale. Spinach. Onions. Leeks. Peas. Squash. Tomatoes. Peppers. Radishes. Fruits: Apples. Apricots. Avocado. Berries. Bananas. Cherries.  Dates. Figs. Grapes. Lemons. Melon. Oranges. Peaches. Plums. Pomegranate. Meats and other protein foods: Beans. Almonds. Sunflower seeds. Pine nuts. Peanuts. Cod. Salmon. Scallops. Shrimp. Tuna. Tilapia. Clams. Oysters. Eggs. Dairy: Low-fat milk. Cheese. Greek yogurt. Beverages: Water . Red wine. Herbal tea. Fats and oils: Extra virgin olive oil. Avocado oil. Grape seed oil. Sweets and desserts: [place] yogurt with honey. Baked apples. Poached pears. Trail mix. Seasoning and other foods: Basil. Cilantro. Coriander. Cumin. Mint.  Parsley. Sage. Rosemary. Tarragon. Garlic. Oregano. Thyme. Pepper. Balsalmic vinegar. Tahini. Hummus. Tomato sauce. Olives. Mushrooms. Limit these Grains: Prepackaged pasta or rice dishes. Prepackaged cereal with added sugar. Vegetables: Deep fried potatoes (french fries). Fruits: Fruit canned in syrup. Meats and other protein foods: Beef. Pork. Lamb. Poultry with skin. Hot dogs. [place]. Dairy: Ice cream. Sour cream. Whole milk. Beverages: Juice. Sugar-sweetened soft drinks. Beer. Liquor and spirits. Fats and oils: Butter. Canola oil. Vegetable oil. Beef fat (tallow). Lard. Sweets and desserts: Cookies. Cakes. Pies. Candy. Seasoning and other foods: Mayonnaise. Premade sauces and marinades. The items listed may not be a complete list. Talk with your dietitian about what dietary choices are right for you. Summary The Mediterranean diet includes both food and lifestyle choices. Eat a variety of fresh fruits and vegetables, beans, nuts, seeds, and whole grains. Limit the amount of red meat and sweets that you eat. Talk with your health care provider about whether it is safe for you to drink red wine in moderation. This means 1 glass a day for nonpregnant women and 2 glasses a day for men. A glass of wine equals 5 oz (150 mL). This information is not intended to replace advice given to you by your health care provider. Make sure you discuss any questions you have with your health care provider. Document Released: 09/30/2015 Document Revised: 11/02/2015 Document Reviewed: 09/30/2015 Elsevier Interactive Patient Education  2017 [employer].
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
## 2023-12-01 ENCOUNTER — Other Ambulatory Visit: Payer: Self-pay | Admitting: Internal Medicine

## 2023-12-01 DIAGNOSIS — E039 Hypothyroidism, unspecified: Secondary | ICD-10-CM

## 2023-12-11 ENCOUNTER — Ambulatory Visit
Admission: RE | Admit: 2023-12-11 | Discharge: 2023-12-11 | Disposition: A | Source: Ambulatory Visit | Attending: Physician Assistant | Admitting: Physician Assistant

## 2023-12-11 DIAGNOSIS — G319 Degenerative disease of nervous system, unspecified: Secondary | ICD-10-CM | POA: Diagnosis not present

## 2023-12-15 ENCOUNTER — Ambulatory Visit: Payer: Self-pay | Admitting: Physician Assistant

## 2023-12-17 NOTE — Progress Notes (Signed)
 I advised Jonathan Powers whom is the daughter and whom is on the DPR of the results of the MRI, she verbally understood results and thanked me for calling.

## 2023-12-24 ENCOUNTER — Encounter: Payer: Self-pay | Admitting: Radiology

## 2024-02-10 ENCOUNTER — Other Ambulatory Visit: Payer: Self-pay | Admitting: Internal Medicine

## 2024-02-10 DIAGNOSIS — E785 Hyperlipidemia, unspecified: Secondary | ICD-10-CM

## 2024-02-16 ENCOUNTER — Other Ambulatory Visit: Payer: Self-pay | Admitting: Internal Medicine

## 2024-02-16 DIAGNOSIS — E039 Hypothyroidism, unspecified: Secondary | ICD-10-CM

## 2024-02-24 ENCOUNTER — Other Ambulatory Visit: Payer: Self-pay | Admitting: Internal Medicine

## 2024-02-24 DIAGNOSIS — E039 Hypothyroidism, unspecified: Secondary | ICD-10-CM

## 2024-06-03 ENCOUNTER — Ambulatory Visit: Admitting: Physician Assistant

## 2024-06-20 ENCOUNTER — Ambulatory Visit: Admitting: Rheumatology

## 2024-08-27 ENCOUNTER — Ambulatory Visit
